# Patient Record
Sex: Male | Born: 1935 | Race: White | Hispanic: No | Marital: Married | Smoking: Former smoker
Health system: Southern US, Community
[De-identification: ages and names within clinical notes are randomized; demographics above are authoritative.]

## PROBLEM LIST (undated history)

## (undated) DIAGNOSIS — C4491 Basal cell carcinoma of skin, unspecified: Secondary | ICD-10-CM

## (undated) DIAGNOSIS — I10 Essential (primary) hypertension: Secondary | ICD-10-CM

## (undated) DIAGNOSIS — I119 Hypertensive heart disease without heart failure: Secondary | ICD-10-CM

## (undated) DIAGNOSIS — R0902 Hypoxemia: Secondary | ICD-10-CM

## (undated) DIAGNOSIS — I714 Abdominal aortic aneurysm, without rupture, unspecified: Secondary | ICD-10-CM

## (undated) DIAGNOSIS — I429 Cardiomyopathy, unspecified: Secondary | ICD-10-CM

## (undated) DIAGNOSIS — Z8719 Personal history of other diseases of the digestive system: Secondary | ICD-10-CM

## (undated) DIAGNOSIS — I6529 Occlusion and stenosis of unspecified carotid artery: Secondary | ICD-10-CM

## (undated) DIAGNOSIS — R202 Paresthesia of skin: Secondary | ICD-10-CM

## (undated) DIAGNOSIS — I4891 Unspecified atrial fibrillation: Secondary | ICD-10-CM

## (undated) DIAGNOSIS — N400 Enlarged prostate without lower urinary tract symptoms: Secondary | ICD-10-CM

## (undated) DIAGNOSIS — E119 Type 2 diabetes mellitus without complications: Secondary | ICD-10-CM

## (undated) DIAGNOSIS — I519 Heart disease, unspecified: Secondary | ICD-10-CM

## (undated) DIAGNOSIS — I82409 Acute embolism and thrombosis of unspecified deep veins of unspecified lower extremity: Secondary | ICD-10-CM

## (undated) DIAGNOSIS — E785 Hyperlipidemia, unspecified: Secondary | ICD-10-CM

## (undated) DIAGNOSIS — I5022 Chronic systolic (congestive) heart failure: Secondary | ICD-10-CM

## (undated) DIAGNOSIS — Z794 Long term (current) use of insulin: Secondary | ICD-10-CM

## (undated) DIAGNOSIS — I2510 Atherosclerotic heart disease of native coronary artery without angina pectoris: Secondary | ICD-10-CM

## (undated) DIAGNOSIS — B351 Tinea unguium: Secondary | ICD-10-CM

## (undated) DIAGNOSIS — I495 Sick sinus syndrome: Secondary | ICD-10-CM

## (undated) DIAGNOSIS — I209 Angina pectoris, unspecified: Secondary | ICD-10-CM

## (undated) DIAGNOSIS — I219 Acute myocardial infarction, unspecified: Secondary | ICD-10-CM

## (undated) DIAGNOSIS — IMO0001 Reserved for inherently not codable concepts without codable children: Secondary | ICD-10-CM

## (undated) DIAGNOSIS — Z950 Presence of cardiac pacemaker: Secondary | ICD-10-CM

## (undated) DIAGNOSIS — L03031 Cellulitis of right toe: Secondary | ICD-10-CM

## (undated) DIAGNOSIS — N184 Chronic kidney disease, stage 4 (severe): Secondary | ICD-10-CM

## (undated) DIAGNOSIS — L600 Ingrowing nail: Secondary | ICD-10-CM

## (undated) DIAGNOSIS — Z8619 Personal history of other infectious and parasitic diseases: Secondary | ICD-10-CM

## (undated) DIAGNOSIS — D649 Anemia, unspecified: Secondary | ICD-10-CM

## (undated) DIAGNOSIS — I340 Nonrheumatic mitral (valve) insufficiency: Secondary | ICD-10-CM

## (undated) DIAGNOSIS — I951 Orthostatic hypotension: Secondary | ICD-10-CM

## (undated) DIAGNOSIS — K219 Gastro-esophageal reflux disease without esophagitis: Secondary | ICD-10-CM

## (undated) DIAGNOSIS — G4730 Sleep apnea, unspecified: Secondary | ICD-10-CM

## (undated) DIAGNOSIS — E669 Obesity, unspecified: Secondary | ICD-10-CM

## (undated) DIAGNOSIS — G629 Polyneuropathy, unspecified: Secondary | ICD-10-CM

## (undated) DIAGNOSIS — R0601 Orthopnea: Secondary | ICD-10-CM

## (undated) DIAGNOSIS — I493 Ventricular premature depolarization: Secondary | ICD-10-CM

## (undated) HISTORY — DX: Essential (primary) hypertension: I10

## (undated) HISTORY — DX: Ventricular premature depolarization: I49.3

## (undated) HISTORY — DX: Occlusion and stenosis of unspecified carotid artery: I65.29

## (undated) HISTORY — DX: Ingrowing nail: L60.0

## (undated) HISTORY — PX: SKIN BIOPSY: SHX1

## (undated) HISTORY — DX: Abdominal aortic aneurysm, without rupture: I71.4

## (undated) HISTORY — DX: Heart disease, unspecified: I51.9

## (undated) HISTORY — DX: Cardiomyopathy, unspecified: I42.9

## (undated) HISTORY — DX: Type 2 diabetes mellitus without complications: E11.9

## (undated) HISTORY — DX: Benign prostatic hyperplasia without lower urinary tract symptoms: N40.0

## (undated) HISTORY — DX: Paresthesia of skin: R20.2

## (undated) HISTORY — DX: Anemia, unspecified: D64.9

## (undated) HISTORY — DX: Tinea unguium: B35.1

## (undated) HISTORY — DX: Sleep apnea, unspecified: G47.30

## (undated) HISTORY — PX: SKIN CANCER EXCISION: SHX779

## (undated) HISTORY — DX: Polyneuropathy, unspecified: G62.9

## (undated) HISTORY — DX: Gastro-esophageal reflux disease without esophagitis: K21.9

## (undated) HISTORY — DX: Atherosclerotic heart disease of native coronary artery without angina pectoris: I25.10

## (undated) HISTORY — PX: VASECTOMY: SHX75

## (undated) HISTORY — DX: Hyperlipidemia, unspecified: E78.5

## (undated) HISTORY — DX: Obesity, unspecified: E66.9

## (undated) HISTORY — DX: Cellulitis of right toe: L03.031

## (undated) HISTORY — DX: Acute embolism and thrombosis of unspecified deep veins of unspecified lower extremity: I82.409

## (undated) HISTORY — PX: CORONARY ARTERY BYPASS GRAFT: SHX141

## (undated) HISTORY — DX: Reserved for inherently not codable concepts without codable children: IMO0001

## (undated) HISTORY — PX: TRANSESOPHAGEAL ECHOCARDIOGRAM: SHX273

## (undated) HISTORY — DX: Sick sinus syndrome: I49.5

## (undated) HISTORY — DX: Long term (current) use of insulin: Z79.4

## (undated) HISTORY — DX: Unspecified atrial fibrillation: I48.91

## (undated) HISTORY — PX: PACEMAKER PLACEMENT: SHX43

## (undated) HISTORY — DX: Abdominal aortic aneurysm, without rupture, unspecified: I71.40

## (undated) HISTORY — DX: Personal history of other infectious and parasitic diseases: Z86.19

## (undated) HISTORY — PX: CATARACT EXTRACTION: SUR2

## (undated) HISTORY — PX: ABDOMINAL AORTIC ANEURYSM REPAIR: SUR1152

## (undated) HISTORY — DX: Chronic systolic (congestive) heart failure: I50.22

## (undated) HISTORY — DX: Nonrheumatic mitral (valve) insufficiency: I34.0

---

## 2003-01-25 ENCOUNTER — Encounter: Payer: Self-pay | Admitting: Pulmonary Disease

## 2003-02-25 ENCOUNTER — Encounter: Payer: Self-pay | Admitting: Pulmonary Disease

## 2004-08-28 HISTORY — PX: INSERT / REPLACE / REMOVE PACEMAKER: SUR710

## 2004-08-28 HISTORY — PX: MITRAL VALVE REPAIR: SHX2039

## 2004-08-28 HISTORY — PX: CARDIAC CATHETERIZATION: SHX172

## 2005-01-30 ENCOUNTER — Inpatient Hospital Stay: Payer: Self-pay | Admitting: Internal Medicine

## 2005-01-31 ENCOUNTER — Inpatient Hospital Stay (HOSPITAL_COMMUNITY)
Admission: AD | Admit: 2005-01-31 | Discharge: 2005-02-12 | Payer: Self-pay | Admitting: Thoracic Surgery (Cardiothoracic Vascular Surgery)

## 2005-02-09 ENCOUNTER — Ambulatory Visit: Payer: Self-pay | Admitting: Internal Medicine

## 2005-02-15 ENCOUNTER — Ambulatory Visit: Payer: Self-pay | Admitting: Endocrinology

## 2005-03-03 ENCOUNTER — Other Ambulatory Visit: Payer: Self-pay

## 2005-03-03 ENCOUNTER — Inpatient Hospital Stay: Payer: Self-pay | Admitting: Internal Medicine

## 2005-03-20 ENCOUNTER — Inpatient Hospital Stay (HOSPITAL_COMMUNITY): Admission: EM | Admit: 2005-03-20 | Discharge: 2005-03-23 | Payer: Self-pay | Admitting: Emergency Medicine

## 2005-03-20 ENCOUNTER — Encounter: Payer: Self-pay | Admitting: Cardiology

## 2005-03-20 ENCOUNTER — Ambulatory Visit: Payer: Self-pay | Admitting: Cardiology

## 2005-03-20 ENCOUNTER — Ambulatory Visit: Payer: Self-pay | Admitting: Family Medicine

## 2005-04-04 ENCOUNTER — Other Ambulatory Visit: Payer: Self-pay

## 2005-04-04 ENCOUNTER — Inpatient Hospital Stay: Payer: Self-pay | Admitting: Internal Medicine

## 2005-04-19 ENCOUNTER — Ambulatory Visit: Payer: Self-pay | Admitting: Family Medicine

## 2005-05-12 ENCOUNTER — Other Ambulatory Visit: Payer: Self-pay

## 2005-05-12 ENCOUNTER — Inpatient Hospital Stay: Payer: Self-pay | Admitting: Infectious Diseases

## 2005-05-28 ENCOUNTER — Ambulatory Visit: Payer: Self-pay | Admitting: Family Medicine

## 2005-07-24 ENCOUNTER — Ambulatory Visit: Payer: Self-pay | Admitting: Vascular Surgery

## 2005-08-01 ENCOUNTER — Ambulatory Visit: Payer: Self-pay | Admitting: Vascular Surgery

## 2005-10-09 ENCOUNTER — Ambulatory Visit: Payer: Self-pay | Admitting: Internal Medicine

## 2005-11-28 ENCOUNTER — Ambulatory Visit: Payer: Self-pay

## 2005-11-28 ENCOUNTER — Ambulatory Visit (HOSPITAL_COMMUNITY): Admission: RE | Admit: 2005-11-28 | Discharge: 2005-11-28 | Payer: Self-pay | Admitting: Nephrology

## 2005-12-15 ENCOUNTER — Ambulatory Visit: Payer: Self-pay | Admitting: Cardiology

## 2005-12-27 ENCOUNTER — Ambulatory Visit: Payer: Self-pay | Admitting: Cardiology

## 2006-01-03 ENCOUNTER — Ambulatory Visit: Payer: Self-pay | Admitting: Internal Medicine

## 2006-01-09 ENCOUNTER — Ambulatory Visit: Payer: Self-pay | Admitting: Cardiology

## 2006-01-23 ENCOUNTER — Encounter: Payer: Self-pay | Admitting: Cardiology

## 2006-01-23 ENCOUNTER — Ambulatory Visit: Payer: Self-pay | Admitting: Internal Medicine

## 2006-01-23 ENCOUNTER — Ambulatory Visit: Payer: Self-pay

## 2006-02-06 ENCOUNTER — Ambulatory Visit: Payer: Self-pay | Admitting: Internal Medicine

## 2006-02-27 ENCOUNTER — Ambulatory Visit: Payer: Self-pay | Admitting: Internal Medicine

## 2006-03-07 ENCOUNTER — Ambulatory Visit: Payer: Self-pay | Admitting: Internal Medicine

## 2006-03-14 ENCOUNTER — Ambulatory Visit: Payer: Self-pay | Admitting: Cardiovascular Disease

## 2006-03-22 ENCOUNTER — Ambulatory Visit: Payer: Self-pay | Admitting: Internal Medicine

## 2006-03-29 ENCOUNTER — Ambulatory Visit: Payer: Self-pay | Admitting: Cardiology

## 2006-04-05 ENCOUNTER — Encounter: Admission: RE | Admit: 2006-04-05 | Discharge: 2006-04-05 | Payer: Self-pay | Admitting: Vascular Surgery

## 2006-04-06 ENCOUNTER — Ambulatory Visit: Payer: Self-pay | Admitting: Cardiology

## 2006-05-02 ENCOUNTER — Ambulatory Visit: Payer: Self-pay | Admitting: *Deleted

## 2006-05-02 ENCOUNTER — Ambulatory Visit: Payer: Self-pay | Admitting: Internal Medicine

## 2006-05-15 ENCOUNTER — Ambulatory Visit: Payer: Self-pay | Admitting: Cardiology

## 2006-05-22 ENCOUNTER — Ambulatory Visit: Payer: Self-pay | Admitting: Cardiovascular Disease

## 2006-05-28 ENCOUNTER — Ambulatory Visit: Payer: Self-pay | Admitting: Internal Medicine

## 2006-05-28 ENCOUNTER — Ambulatory Visit: Payer: Self-pay | Admitting: Cardiology

## 2006-06-11 ENCOUNTER — Ambulatory Visit: Payer: Self-pay | Admitting: Cardiology

## 2006-07-09 ENCOUNTER — Ambulatory Visit: Payer: Self-pay | Admitting: Cardiovascular Disease

## 2006-07-24 ENCOUNTER — Ambulatory Visit: Payer: Self-pay | Admitting: Cardiology

## 2006-08-10 ENCOUNTER — Ambulatory Visit: Payer: Self-pay | Admitting: Cardiology

## 2006-08-23 ENCOUNTER — Ambulatory Visit: Payer: Self-pay | Admitting: Cardiology

## 2006-09-17 ENCOUNTER — Ambulatory Visit: Payer: Self-pay | Admitting: Internal Medicine

## 2006-10-01 ENCOUNTER — Ambulatory Visit: Payer: Self-pay | Admitting: Cardiology

## 2006-10-15 ENCOUNTER — Ambulatory Visit: Payer: Self-pay | Admitting: Internal Medicine

## 2006-10-26 ENCOUNTER — Ambulatory Visit: Payer: Self-pay | Admitting: Internal Medicine

## 2006-10-26 LAB — CONVERTED CEMR LAB
BUN: 50 mg/dL — ABNORMAL HIGH (ref 6–23)
Chloride: 100 meq/L (ref 96–112)
GFR calc Af Amer: 28 mL/min
Glucose, Bld: 170 mg/dL — ABNORMAL HIGH (ref 70–99)

## 2006-10-29 ENCOUNTER — Ambulatory Visit: Payer: Self-pay | Admitting: Cardiovascular Disease

## 2006-10-29 ENCOUNTER — Ambulatory Visit: Payer: Self-pay | Admitting: Cardiology

## 2006-10-29 LAB — CONVERTED CEMR LAB
CO2: 34 meq/L — ABNORMAL HIGH (ref 19–32)
Calcium: 9 mg/dL (ref 8.4–10.5)
Chloride: 102 meq/L (ref 96–112)
Creatinine, Ser: 2.9 mg/dL — ABNORMAL HIGH (ref 0.4–1.5)
Glucose, Bld: 123 mg/dL — ABNORMAL HIGH (ref 70–99)
Potassium: 4.8 meq/L (ref 3.5–5.1)
Sodium: 142 meq/L (ref 135–145)

## 2006-11-02 ENCOUNTER — Encounter: Admission: RE | Admit: 2006-11-02 | Discharge: 2006-11-02 | Payer: Self-pay | Admitting: Vascular Surgery

## 2006-11-02 ENCOUNTER — Ambulatory Visit: Payer: Self-pay | Admitting: Vascular Surgery

## 2006-11-19 ENCOUNTER — Ambulatory Visit: Payer: Self-pay | Admitting: Cardiology

## 2006-11-28 ENCOUNTER — Ambulatory Visit: Payer: Self-pay | Admitting: Cardiology

## 2006-11-28 ENCOUNTER — Encounter: Payer: Self-pay | Admitting: Cardiology

## 2006-11-28 ENCOUNTER — Ambulatory Visit: Payer: Self-pay

## 2006-12-14 ENCOUNTER — Ambulatory Visit: Payer: Self-pay | Admitting: Vascular Surgery

## 2006-12-26 ENCOUNTER — Ambulatory Visit: Payer: Self-pay | Admitting: Vascular Surgery

## 2006-12-27 ENCOUNTER — Encounter (INDEPENDENT_AMBULATORY_CARE_PROVIDER_SITE_OTHER): Payer: Self-pay | Admitting: Specialist

## 2006-12-27 ENCOUNTER — Inpatient Hospital Stay (HOSPITAL_COMMUNITY): Admission: RE | Admit: 2006-12-27 | Discharge: 2006-12-31 | Payer: Self-pay | Admitting: Vascular Surgery

## 2006-12-27 ENCOUNTER — Ambulatory Visit: Payer: Self-pay | Admitting: Vascular Surgery

## 2007-01-08 ENCOUNTER — Ambulatory Visit: Payer: Self-pay | Admitting: Cardiology

## 2007-01-10 ENCOUNTER — Ambulatory Visit: Payer: Self-pay | Admitting: *Deleted

## 2007-01-25 ENCOUNTER — Ambulatory Visit: Payer: Self-pay | Admitting: Vascular Surgery

## 2007-02-11 ENCOUNTER — Ambulatory Visit: Payer: Self-pay | Admitting: Cardiology

## 2007-03-03 ENCOUNTER — Other Ambulatory Visit: Payer: Self-pay

## 2007-03-04 ENCOUNTER — Ambulatory Visit: Payer: Self-pay | Admitting: Cardiology

## 2007-03-04 ENCOUNTER — Inpatient Hospital Stay: Payer: Self-pay | Admitting: Internal Medicine

## 2007-03-04 ENCOUNTER — Other Ambulatory Visit: Payer: Self-pay

## 2007-03-21 ENCOUNTER — Encounter: Payer: Self-pay | Admitting: Internal Medicine

## 2007-04-08 ENCOUNTER — Ambulatory Visit: Payer: Self-pay | Admitting: Cardiology

## 2007-04-24 ENCOUNTER — Encounter (HOSPITAL_COMMUNITY): Admission: RE | Admit: 2007-04-24 | Discharge: 2007-07-05 | Payer: Self-pay | Admitting: Nephrology

## 2007-05-15 ENCOUNTER — Ambulatory Visit: Payer: Self-pay | Admitting: Internal Medicine

## 2007-05-24 ENCOUNTER — Ambulatory Visit: Payer: Self-pay | Admitting: Cardiology

## 2007-07-22 ENCOUNTER — Ambulatory Visit: Payer: Self-pay | Admitting: Cardiology

## 2007-07-30 ENCOUNTER — Ambulatory Visit: Payer: Self-pay | Admitting: Internal Medicine

## 2007-07-31 ENCOUNTER — Ambulatory Visit: Payer: Self-pay

## 2007-07-31 ENCOUNTER — Encounter (HOSPITAL_COMMUNITY): Admission: RE | Admit: 2007-07-31 | Discharge: 2007-10-29 | Payer: Self-pay | Admitting: Nephrology

## 2007-08-14 ENCOUNTER — Ambulatory Visit: Payer: Self-pay | Admitting: Vascular Surgery

## 2007-09-16 ENCOUNTER — Ambulatory Visit: Payer: Self-pay | Admitting: Cardiology

## 2007-10-30 ENCOUNTER — Ambulatory Visit: Payer: Self-pay | Admitting: Internal Medicine

## 2007-11-06 ENCOUNTER — Encounter (HOSPITAL_COMMUNITY): Admission: RE | Admit: 2007-11-06 | Discharge: 2008-02-04 | Payer: Self-pay | Admitting: Nephrology

## 2007-11-14 ENCOUNTER — Ambulatory Visit: Payer: Self-pay | Admitting: Cardiology

## 2008-01-07 ENCOUNTER — Ambulatory Visit: Payer: Self-pay | Admitting: Cardiology

## 2008-02-03 ENCOUNTER — Ambulatory Visit: Payer: Self-pay | Admitting: Internal Medicine

## 2008-02-25 ENCOUNTER — Encounter (HOSPITAL_COMMUNITY): Admission: RE | Admit: 2008-02-25 | Discharge: 2008-05-20 | Payer: Self-pay | Admitting: Nephrology

## 2008-02-26 ENCOUNTER — Ambulatory Visit: Payer: Self-pay | Admitting: Vascular Surgery

## 2008-03-02 ENCOUNTER — Ambulatory Visit: Payer: Self-pay | Admitting: Pulmonary Disease

## 2008-03-02 DIAGNOSIS — E785 Hyperlipidemia, unspecified: Secondary | ICD-10-CM

## 2008-03-02 DIAGNOSIS — J309 Allergic rhinitis, unspecified: Secondary | ICD-10-CM | POA: Insufficient documentation

## 2008-03-02 DIAGNOSIS — I499 Cardiac arrhythmia, unspecified: Secondary | ICD-10-CM | POA: Insufficient documentation

## 2008-03-02 DIAGNOSIS — G4733 Obstructive sleep apnea (adult) (pediatric): Secondary | ICD-10-CM

## 2008-03-02 DIAGNOSIS — I219 Acute myocardial infarction, unspecified: Secondary | ICD-10-CM | POA: Insufficient documentation

## 2008-03-02 DIAGNOSIS — I10 Essential (primary) hypertension: Secondary | ICD-10-CM | POA: Insufficient documentation

## 2008-03-16 ENCOUNTER — Ambulatory Visit: Payer: Self-pay

## 2008-03-30 ENCOUNTER — Ambulatory Visit: Payer: Self-pay | Admitting: Pulmonary Disease

## 2008-03-30 DIAGNOSIS — G4700 Insomnia, unspecified: Secondary | ICD-10-CM

## 2008-05-19 ENCOUNTER — Ambulatory Visit: Payer: Self-pay | Admitting: Internal Medicine

## 2008-05-19 ENCOUNTER — Ambulatory Visit: Payer: Self-pay | Admitting: Cardiology

## 2008-06-10 ENCOUNTER — Telehealth (INDEPENDENT_AMBULATORY_CARE_PROVIDER_SITE_OTHER): Payer: Self-pay | Admitting: *Deleted

## 2008-06-22 ENCOUNTER — Ambulatory Visit: Payer: Self-pay | Admitting: Pulmonary Disease

## 2008-07-20 ENCOUNTER — Ambulatory Visit: Payer: Self-pay | Admitting: Internal Medicine

## 2008-08-05 ENCOUNTER — Encounter (HOSPITAL_COMMUNITY): Admission: RE | Admit: 2008-08-05 | Discharge: 2008-08-27 | Payer: Self-pay | Admitting: Nephrology

## 2008-08-24 ENCOUNTER — Ambulatory Visit: Payer: Self-pay | Admitting: Cardiology

## 2008-08-26 ENCOUNTER — Ambulatory Visit: Payer: Self-pay | Admitting: Vascular Surgery

## 2008-10-26 ENCOUNTER — Ambulatory Visit: Payer: Self-pay | Admitting: Internal Medicine

## 2008-11-04 ENCOUNTER — Encounter: Payer: Self-pay | Admitting: Internal Medicine

## 2008-11-10 ENCOUNTER — Encounter: Payer: Self-pay | Admitting: Internal Medicine

## 2008-11-10 ENCOUNTER — Ambulatory Visit: Payer: Self-pay

## 2008-11-22 DIAGNOSIS — I080 Rheumatic disorders of both mitral and aortic valves: Secondary | ICD-10-CM

## 2008-11-22 DIAGNOSIS — Z951 Presence of aortocoronary bypass graft: Secondary | ICD-10-CM

## 2008-11-22 DIAGNOSIS — I2510 Atherosclerotic heart disease of native coronary artery without angina pectoris: Secondary | ICD-10-CM

## 2008-11-23 ENCOUNTER — Encounter: Payer: Self-pay | Admitting: Cardiology

## 2008-11-23 ENCOUNTER — Ambulatory Visit: Payer: Self-pay | Admitting: Cardiology

## 2008-11-23 DIAGNOSIS — I255 Ischemic cardiomyopathy: Secondary | ICD-10-CM

## 2008-12-03 ENCOUNTER — Encounter: Payer: Self-pay | Admitting: Cardiology

## 2008-12-03 ENCOUNTER — Encounter: Payer: Self-pay | Admitting: Pulmonary Disease

## 2008-12-03 ENCOUNTER — Ambulatory Visit: Payer: Self-pay | Admitting: Internal Medicine

## 2008-12-09 ENCOUNTER — Ambulatory Visit: Payer: Self-pay | Admitting: Vascular Surgery

## 2008-12-15 ENCOUNTER — Inpatient Hospital Stay (HOSPITAL_COMMUNITY): Admission: RE | Admit: 2008-12-15 | Discharge: 2008-12-16 | Payer: Self-pay | Admitting: Vascular Surgery

## 2008-12-15 ENCOUNTER — Encounter: Payer: Self-pay | Admitting: Cardiology

## 2008-12-15 ENCOUNTER — Encounter: Payer: Self-pay | Admitting: Vascular Surgery

## 2008-12-15 ENCOUNTER — Ambulatory Visit: Payer: Self-pay | Admitting: Vascular Surgery

## 2008-12-17 ENCOUNTER — Encounter (INDEPENDENT_AMBULATORY_CARE_PROVIDER_SITE_OTHER): Payer: Self-pay | Admitting: *Deleted

## 2008-12-30 ENCOUNTER — Ambulatory Visit: Payer: Self-pay | Admitting: Vascular Surgery

## 2009-02-01 ENCOUNTER — Encounter: Payer: Self-pay | Admitting: Internal Medicine

## 2009-02-01 ENCOUNTER — Ambulatory Visit: Payer: Self-pay | Admitting: Internal Medicine

## 2009-02-03 ENCOUNTER — Ambulatory Visit: Payer: Self-pay | Admitting: Vascular Surgery

## 2009-02-22 ENCOUNTER — Telehealth: Payer: Self-pay | Admitting: Internal Medicine

## 2009-02-26 ENCOUNTER — Encounter: Payer: Self-pay | Admitting: Internal Medicine

## 2009-03-12 ENCOUNTER — Encounter: Payer: Self-pay | Admitting: Internal Medicine

## 2009-03-12 ENCOUNTER — Ambulatory Visit: Payer: Self-pay | Admitting: Internal Medicine

## 2009-03-12 DIAGNOSIS — I493 Ventricular premature depolarization: Secondary | ICD-10-CM

## 2009-03-12 DIAGNOSIS — I4949 Other premature depolarization: Secondary | ICD-10-CM | POA: Insufficient documentation

## 2009-03-16 ENCOUNTER — Telehealth: Payer: Self-pay | Admitting: Cardiology

## 2009-03-16 ENCOUNTER — Telehealth (INDEPENDENT_AMBULATORY_CARE_PROVIDER_SITE_OTHER): Payer: Self-pay | Admitting: *Deleted

## 2009-03-19 ENCOUNTER — Inpatient Hospital Stay (HOSPITAL_COMMUNITY): Admission: RE | Admit: 2009-03-19 | Discharge: 2009-03-20 | Payer: Self-pay | Admitting: Internal Medicine

## 2009-03-19 ENCOUNTER — Ambulatory Visit: Payer: Self-pay | Admitting: Internal Medicine

## 2009-03-23 ENCOUNTER — Encounter (INDEPENDENT_AMBULATORY_CARE_PROVIDER_SITE_OTHER): Payer: Self-pay | Admitting: *Deleted

## 2009-04-21 ENCOUNTER — Ambulatory Visit: Payer: Self-pay | Admitting: Internal Medicine

## 2009-04-21 DIAGNOSIS — I714 Abdominal aortic aneurysm, without rupture, unspecified: Secondary | ICD-10-CM | POA: Insufficient documentation

## 2009-04-29 ENCOUNTER — Ambulatory Visit: Payer: Self-pay

## 2009-04-29 ENCOUNTER — Encounter: Payer: Self-pay | Admitting: Internal Medicine

## 2009-04-29 ENCOUNTER — Ambulatory Visit: Payer: Self-pay | Admitting: Cardiology

## 2009-05-31 ENCOUNTER — Encounter: Payer: Self-pay | Admitting: Cardiology

## 2009-06-23 ENCOUNTER — Ambulatory Visit: Payer: Self-pay | Admitting: Vascular Surgery

## 2009-08-09 ENCOUNTER — Ambulatory Visit: Payer: Self-pay | Admitting: Internal Medicine

## 2009-08-09 ENCOUNTER — Other Ambulatory Visit: Payer: Self-pay | Admitting: Family Medicine

## 2009-08-09 DIAGNOSIS — N184 Chronic kidney disease, stage 4 (severe): Secondary | ICD-10-CM

## 2009-08-09 LAB — CONVERTED CEMR LAB
Albumin: 3 g/dL
Calcium: 9 mg/dL
Chloride: 101 meq/L
Hemoglobin: 12.1 g/dL
MCV: 89 fL
Potassium: 4.2 meq/L
RBC: 4.07 M/uL
RDW: 15.7 %

## 2009-08-10 ENCOUNTER — Encounter: Payer: Self-pay | Admitting: Internal Medicine

## 2009-08-16 ENCOUNTER — Encounter: Payer: Self-pay | Admitting: Internal Medicine

## 2009-08-17 ENCOUNTER — Ambulatory Visit: Payer: Self-pay | Admitting: Internal Medicine

## 2009-08-31 ENCOUNTER — Encounter: Payer: Self-pay | Admitting: Cardiology

## 2009-09-10 ENCOUNTER — Encounter: Payer: Self-pay | Admitting: Cardiology

## 2009-09-27 ENCOUNTER — Ambulatory Visit: Payer: Self-pay | Admitting: Cardiology

## 2009-10-25 ENCOUNTER — Ambulatory Visit: Payer: Self-pay | Admitting: Internal Medicine

## 2009-11-26 ENCOUNTER — Ambulatory Visit: Payer: Self-pay | Admitting: Internal Medicine

## 2009-11-30 LAB — CONVERTED CEMR LAB
AST: 24 units/L (ref 0–37)
Alkaline Phosphatase: 55 units/L (ref 39–117)
Bilirubin, Direct: 0.1 mg/dL (ref 0.0–0.3)
GFR calc non Af Amer: 22.02 mL/min (ref >60)
Glucose, Bld: 91 mg/dL (ref 70–99)
Potassium: 4.3 meq/L (ref 3.5–5.1)
Sodium: 143 meq/L (ref 135–145)
Total Bilirubin: 0.7 mg/dL (ref 0.3–1.2)

## 2009-12-08 ENCOUNTER — Ambulatory Visit: Payer: Self-pay | Admitting: Vascular Surgery

## 2009-12-15 ENCOUNTER — Encounter: Payer: Self-pay | Admitting: Internal Medicine

## 2010-02-01 ENCOUNTER — Ambulatory Visit: Payer: Self-pay | Admitting: Cardiology

## 2010-03-04 ENCOUNTER — Encounter: Payer: Self-pay | Admitting: Cardiology

## 2010-03-17 ENCOUNTER — Encounter: Payer: Self-pay | Admitting: Cardiology

## 2010-06-02 ENCOUNTER — Encounter: Payer: Self-pay | Admitting: Internal Medicine

## 2010-06-02 ENCOUNTER — Ambulatory Visit: Payer: Self-pay | Admitting: Internal Medicine

## 2010-06-02 DIAGNOSIS — I495 Sick sinus syndrome: Secondary | ICD-10-CM | POA: Insufficient documentation

## 2010-06-06 LAB — CONVERTED CEMR LAB
ALT: 21 units/L (ref 0–53)
AST: 23 units/L (ref 0–37)
BUN: 36 mg/dL — ABNORMAL HIGH (ref 6–23)
CO2: 32 meq/L (ref 19–32)
Chloride: 99 meq/L (ref 96–112)
Creatinine, Ser: 2.8 mg/dL — ABNORMAL HIGH (ref 0.4–1.5)
Glucose, Bld: 144 mg/dL — ABNORMAL HIGH (ref 70–99)
Potassium: 4.4 meq/L (ref 3.5–5.1)
Total Protein: 6.9 g/dL (ref 6.0–8.3)

## 2010-06-17 ENCOUNTER — Encounter: Payer: Self-pay | Admitting: Internal Medicine

## 2010-07-14 ENCOUNTER — Ambulatory Visit: Payer: Self-pay | Admitting: Vascular Surgery

## 2010-08-05 ENCOUNTER — Encounter: Payer: Self-pay | Admitting: Cardiology

## 2010-08-05 ENCOUNTER — Ambulatory Visit: Payer: Self-pay | Admitting: Cardiology

## 2010-09-06 ENCOUNTER — Encounter (INDEPENDENT_AMBULATORY_CARE_PROVIDER_SITE_OTHER): Payer: Self-pay | Admitting: *Deleted

## 2010-09-12 ENCOUNTER — Encounter: Payer: Self-pay | Admitting: Internal Medicine

## 2010-09-12 ENCOUNTER — Ambulatory Visit
Admission: RE | Admit: 2010-09-12 | Discharge: 2010-09-12 | Payer: Self-pay | Source: Home / Self Care | Attending: Internal Medicine | Admitting: Internal Medicine

## 2010-09-25 LAB — CONVERTED CEMR LAB
Albumin: 3.2 g/dL — ABNORMAL LOW (ref 3.5–5.2)
Alkaline Phosphatase: 43 units/L (ref 39–117)
BUN: 41 mg/dL — ABNORMAL HIGH (ref 6–23)
Basophils Absolute: 0 10*3/uL (ref 0.0–0.1)
Basophils Relative: 0 % (ref 0.0–3.0)
CO2: 32 meq/L (ref 19–32)
Calcium: 8.9 mg/dL
Calcium: 9.1 mg/dL (ref 8.4–10.5)
Calcium: 9.1 mg/dL (ref 8.4–10.5)
Cholesterol CEMR: 135 mg/dL
Cholesterol: 135 mg/dL
Creatinine, Ser: 2.4 mg/dL — ABNORMAL HIGH (ref 0.4–1.5)
Creatinine, Ser: 2.53 mg/dL
Eosinophils Absolute: 0.2 10*3/uL (ref 0.0–0.7)
Glomerular Filtration Rate, Af Am: 31 mL/min/{1.73_m2}
Glucose, Bld: 112 mg/dL
Glucose, Bld: 132 mg/dL — ABNORMAL HIGH (ref 70–99)
Glucose, Bld: 147 mg/dL — ABNORMAL HIGH (ref 70–99)
HCT: 38.2 %
HCT: 38.3 % — ABNORMAL LOW (ref 39.0–52.0)
HDL: 34 mg/dL
Hemoglobin: 12.3 g/dL — ABNORMAL LOW (ref 13.0–17.0)
Hemoglobin: 12.3 g/dL — ABNORMAL LOW (ref 13.0–17.0)
Hgb A1c MFr Bld: 6.9 %
LDL Cholesterol: 82 mg/dL
Lymphocytes Relative: 11.8 % — ABNORMAL LOW (ref 12.0–46.0)
MCHC: 32.1 g/dL (ref 30.0–36.0)
MCHC: 32.6 g/dL (ref 30.0–36.0)
MCHC: 32.8 g/dL
MCV: 88.2 fL (ref 78.0–100.0)
Monocytes Absolute: 0.7 10*3/uL (ref 0.1–1.0)
Neutro Abs: 4.1 10*3/uL (ref 1.4–7.7)
Neutro Abs: 4.8 10*3/uL (ref 1.4–7.7)
Neutrophils Relative %: 70.3 % (ref 43.0–77.0)
Neutrophils Relative %: 74 % (ref 43.0–77.0)
Platelets: 186 10*3/uL (ref 150.0–400.0)
Platelets: 192 10*3/uL
Potassium: 4.1 meq/L
Potassium: 5 meq/L (ref 3.5–5.1)
Prothrombin Time: 24.3 s — ABNORMAL HIGH (ref 10.9–13.3)
RBC: 4.32 M/uL
RBC: 4.34 M/uL (ref 4.22–5.81)
RDW: 15.7 % — ABNORMAL HIGH (ref 11.5–14.6)
RDW: 16 % — ABNORMAL HIGH (ref 11.5–14.6)
Sodium: 141 meq/L
TSH: 1.28 microintl units/mL (ref 0.35–5.50)
Total Bilirubin: 0.4 mg/dL
Triglyceride fasting, serum: 94 mg/dL
WBC: 6 10*3/uL
aPTT: 38.5 s — ABNORMAL HIGH (ref 21.7–28.8)

## 2010-09-28 NOTE — Cardiovascular Report (Signed)
Summary: Office Visit   Office Visit   Imported By: Roderic Ovens 12/03/2009 14:14:14  _____________________________________________________________________  External Attachment:    Type:   Image     Comment:   External Document

## 2010-09-28 NOTE — Cardiovascular Report (Signed)
Summary: Office Visit    Office Visit    Imported By: Roderic Ovens 06/03/2010 16:45:03  _____________________________________________________________________  External Attachment:    Type:   Image     Comment:   External Document

## 2010-09-28 NOTE — Assessment & Plan Note (Signed)
Summary: f44m/dm   Visit Type:  Follow-up Referring Provider:  Dr Antoine Poche Primary Provider:  [name], MD   History of Present Illness: The patient presents today for routine electrophysiology followup. He reports doing very well since last being seen in our clinic.  His symptomatic PVCs have resolved.  His energy and SOB have improved.  His edema is also better.  He reports that he is following with Dr Caryn Section and is renal function has been stable. The patient denies symptoms of palpitations, chest pain,  orthopnea, PND, dizziness, presyncope, syncope, or neurologic sequela. The patient is tolerating medications without difficulties and is otherwise without complaint today.   Current Medications (verified): 1)  Crestor 10 Mg  Tabs (Rosuvastatin Calcium) .... Take 1 Tablet By Mouth Once A Day 2)  Klor-Con M10 10 Meq  Tbcr (Potassium Chloride Crys Cr) .... Take2  Tablet By Mouth Two Times A Day 3)  Furosemide 80 Mg  Tabs (Furosemide) .... Take 1 Tablet By Mouth 2 Times A Day 4)  Flonase 50 Mcg/act  Susp (Fluticasone Propionate) .... As Needed 5)  Aspirin Low Dose 81 Mg  Tabs (Aspirin) .... Take 1 Tablet By Mouth Once A Day 6)  Calcitriol 0.5 Mcg  Caps (Calcitriol) .... Take 1 Tablet By Mouth Once A Day 7)  Carvedilol 6.25 Mg  Tabs (Carvedilol) .... Take 1 Tablet By Mouth Two Times A Day 8)  Hydralazine Hcl 25 Mg Tabs (Hydralazine Hcl) .... 1/2 By Mouth Two Times A Day 9)  Docusate Sodium 100 Mg  Caps (Docusate Sodium) .... Take 1 Tablet By Mouth Two Times A Day 10)  Warfarin Sodium 2 Mg Tabs (Warfarin Sodium) .... Use As Directed By Anticoagualtion Clinic 11)  Flomax 0.4 Mg Xr24h-Cap (Tamsulosin Hcl) .... One By Mouth Daily 12)  Novolog 100 Unit/ml Soln (Insulin Aspart) .... As Directed (17 Units Twice Daily) 13)  Isosorbide Dinitrate 30 Mg Tabs (Isosorbide Dinitrate) .... One By Mouth Two Times A Day 14)  Omeprazole 20 Mg Tbec (Omeprazole) .... 3 Times A Week 15)  Amiodarone Hcl 200 Mg  Tabs (Amiodarone Hcl) .... One By Mouth Daily  Allergies: 1)  ! * Shellfish  Past History:  Past Medical History: Reviewed history from 09/27/2009 and no changes required. Cardiomyopathy with an ejection fraction of approximately 40% Coronary artery disease s/p CABG 2006 Mitral regurgitation with  mitral valve repair Sick sinus syndrome status post pacemaker placement 6/06 Frequent premature ventricular contractions s/p ablation 7/10 Sleep apnea not tolerant of CPAP Chronic renal insufficiency Diabetes mellitus,  Hypertension Benign prostatic hypertrophy Anemia Deep venous thrombosis on chronic Coumadin therapy.   Past Surgical History: Reviewed history from 04/21/2009 and no changes required. Status post CABG (LIMA to LAD, SVG to Circ, SVG to PL) 2006 Mitral valve repair with a #28 Edwards logic ring, Status post permanent pacemaker AAA s/p repair May 2008 Ablation for PVCs 7/10  Social History: Reviewed history from 03/12/2009 and no changes required. The patient lives with his girlfriend.  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  He quit smoking in 1988 after a 17-pack- year history.  He occasionally drinks alcohol.  None since his surgery in June.  No illicit drug use.  Vital Signs:  Patient profile:   75 year old male Height:      72 inches Weight:      268 pounds BMI:     36.48 Pulse rate:   72 / minute BP sitting:   104 / 50  (left  arm)  Vitals Entered By: Laurance Flatten CMA (June 02, 2010 9:09 AM)  Physical Exam  General:  chronically ill, obese Head:  normocephalic and atraumatic Eyes:  PERRLA/EOM intact; conjunctiva and lids normal. Mouth:  Teeth, gums and palate normal. Oral mucosa normal. Neck:  Neck supple, no JVD. No masses, thyromegaly or abnormal cervical nodes. Chest Wall:  well-healed sternotomy scar and pacemaker pocket Lungs:  Clear bilaterally to auscultation and percussion. Heart:  RRR, .  S1, S2.  NO S3.  NO significant  murmurs. Abdomen:  Bowel sounds positive; abdomen soft and non-tender without masses, organomegaly, or hernias noted. No hepatosplenomegaly, obese Msk:  Back normal, normal gait. Muscle strength and tone normal. Pulses:  deminished pedal pulses Extremities:  bilateral mild lower extremity edema, chronic venous stasis changes Neurologic:  Alert and oriented x 3. Skin:  Intact without lesions or rashes.   PPM Specifications Following MD:  Sherryl Manges, MD     Referring MD:  [facility] PPM Vendor:  [employer]     PPM Model Number:  [id number]     PPM Serial Number:  [id number] PPM DOI:  02/09/2005      Lead 1    Location: RA     DOI: 02/09/2005     Model #: 2355     Serial #: [id number]     Status: active Lead 2    Location: RV     DOI: 02/09/2005     Model #: 0623     Serial #: [id number]     Status: active   Indications:  SINUS NODE DYSFUNCTION    PPM Follow Up Battery Voltage:  2.99 V     Pacer Dependent:  Yes       PPM Device Measurements Atrium  Amplitude: 2.4 mV, Impedance: 528 ohms, Threshold: 0.5 V at 0.4 msec Right Ventricle  Amplitude: 18.2 mV, Impedance: 432 ohms, Threshold: 0.5 V at 0.4 msec  Episodes MS Episodes:  0     Coumadin:  Yes Ventricular High Rate:  0     Atrial Pacing:  100%     Ventricular Pacing:  0.3%  Parameters Mode:  MVP     Lower Rate Limit:  60     Upper Rate Limit:  140 Paced AV Delay:  180     Sensed AV Delay:  300 Next Remote Date:  09/01/2010     Next Cardiology Appt Due:  05/29/2011 Tech Comments:  NORMAL DEVICE FUNCTIION.  SINCE LAST CHECK IN APRIL PVC RUNS AND SINGLES DECREASED.  LAST CHECK PVC RUNS 8.5 PER HOUR AND ON TODAYS CHECK <0.1 PER HOUR.  PVC SINGLES LAST CHECK >999.9/HR AND ON TODAYS CHECK 16.9/HR.  NO CHANGES MADE. CARELINK CHECK 09-01-10 AND ROV IN 12 MTHS W/JA. Vella Kohler MD Comments:  He has no PVCs on telemetry today.  Review of his PVC counter reveals dramatic (near resolution) of PVCs on amiodarone.  No changes today.  Impression  & Recommendations:  Problem # 1:  PAROXYSMAL VENTRICULAR TACHYCARDIA (ICD-427.1)  symptomatic PVCs and NSVT are resolved with amiodarone therapy we will check LFTs and TFTs today no changes  we will consider decreasing amiodarone to 100mg  daily if PVCs remain supressed at 1 year follow-up. I would avoid repeat ablation given his multiple significant comorbidities.  His updated medication list for this problem includes:    Aspirin Low Dose 81 Mg Tabs (Aspirin) .[name]... Take 1 tablet by mouth once a day    Carvedilol 6.25 Mg Tabs (Carvedilol) .[name]... Take 1  tablet by mouth two times a day    Warfarin Sodium 2 Mg Tabs (Warfarin sodium) ..... Use as directed by anticoagualtion clinic    Isosorbide Dinitrate 30 Mg Tabs (Isosorbide dinitrate) ..... One by mouth two times a day    Amiodarone Hcl 200 Mg Tabs (Amiodarone hcl) ..... One by mouth daily  Problem # 2:  CHRONIC KIDNEY DISEASE UNSPECIFIED (ICD-585.9) stable and followed by Dr Caryn Section  Problem # 3:  CARDIOMYOPATHY (ICD-425.4) the patient has an ischemic CM (EF 40%) with chronic systolic dysfunction and NYHA Class II/III CHF.  Symptoms have improved with supression of PVCs. continue medical therpay as directed by Dr Antoine Poche  Problem # 4:  PACEMAKER, PERMANENT (ICD-V45.01) normal pacemaker function no changes today  Other Orders: TLB-BMP (Basic Metabolic Panel-BMET) (80048-METABOL) TLB-Hepatic/Liver Function Pnl (80076-HEPATIC)  Patient Instructions: 1)  Your physician wants you to follow-up in:  12months with DrAllred You will receive a reminder letter in the mail two months in advance. If you don't receive a letter, please call our office to schedule the follow-up appointment. 2)  [facility] on 09/01/2010

## 2010-09-28 NOTE — Miscellaneous (Signed)
  Clinical Lists Changes  Observations: Added new observation of US CAROTID: 1. 60-79% stenosis noted in the left internal carotid artery.   2. 40-59% stenosis noted in the right internal carotid artery.        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 60-79% stenosis noted in the left internal carotid artery.   2. 40-59% stenosis noted in the right internal carotid artery.

## 2010-09-28 NOTE — Consult Note (Signed)
Summary: [facility]   Imported By: Marylou Mccoy 11/10/2009 12:25:09  _____________________________________________________________________  External Attachment:    Type:   Image     Comment:   External Document

## 2010-09-28 NOTE — Assessment & Plan Note (Signed)
Summary: 6 month rov 414.01  pfh,rn   Visit Type:  Follow-up Primary Provider:  [name], MD  CC:  Cardiomyopathy and CAD.  History of Present Illness: The patient returns for followup of his coronary disease and cardiomyopathy. Since I last saw him he has had no new cardiovascular complaints. He still does not dissipate very well in risk reduction. These actually gained 17 pounds since I saw him last. He admits to eating too much. He is very sedentary. He does not get shortness of breath, PND or orthopnea. He does not get chest pressure, neck or arm discomfort. He does not have palpitations, presyncope or syncope.   Current Medications (verified): 1)  Crestor 10 Mg  Tabs (Rosuvastatin Calcium) .... Take 1 Tablet By Mouth Once A Day 2)  Klor-Con M10 10 Meq  Tbcr (Potassium Chloride Crys Cr) .... Take2  Tablet By Mouth Two Times A Day 3)  Furosemide 80 Mg  Tabs (Furosemide) .... Take 1 Tablet By Mouth 2 Times A Day 4)  Flonase 50 Mcg/act  Susp (Fluticasone Propionate) .... As Needed 5)  Aspirin Low Dose 81 Mg  Tabs (Aspirin) .... Take 1 Tablet By Mouth Once A Day 6)  Calcitriol 0.5 Mcg  Caps (Calcitriol) .... Take 1 Tablet By Mouth Once A Day 7)  Carvedilol 6.25 Mg  Tabs (Carvedilol) .... Take 1 Tablet By Mouth Two Times A Day 8)  Hydralazine Hcl 25 Mg Tabs (Hydralazine Hcl) .... 1/2 By Mouth Two Times A Day 9)  Docusate Sodium 100 Mg  Caps (Docusate Sodium) .... Take 1 Tablet By Mouth Two Times A Day 10)  Warfarin Sodium 2 Mg Tabs (Warfarin Sodium) .... Use As Directed By Anticoagualtion Clinic 11)  Flomax 0.4 Mg Xr24h-Cap (Tamsulosin Hcl) .... One By Mouth Daily 12)  Novolog 100 Unit/ml Soln (Insulin Aspart) .... As Directed (17 Units Twice Daily) 13)  Isosorbide Dinitrate 30 Mg Tabs (Isosorbide Dinitrate) .... One By Mouth Two Times A Day 14)  Omeprazole 20 Mg Tbec (Omeprazole) .... 3 Times A Week 15)  Amiodarone Hcl 200 Mg Tabs (Amiodarone Hcl) .... One By Mouth Daily  Allergies  (verified): 1)  ! * Shellfish  Past History:  Past Medical History: Reviewed history from 09/27/2009 and no changes required. Cardiomyopathy with an ejection fraction of approximately 40% Coronary artery disease s/p CABG 2006 Mitral regurgitation with  mitral valve repair Sick sinus syndrome status post pacemaker placement 6/06 Frequent premature ventricular contractions s/p ablation 7/10 Sleep apnea not tolerant of CPAP Chronic renal insufficiency Diabetes mellitus,  Hypertension Benign prostatic hypertrophy Anemia Deep venous thrombosis on chronic Coumadin therapy.   Past Surgical History: Reviewed history from 04/21/2009 and no changes required. Status post CABG (LIMA to LAD, SVG to Circ, SVG to PL) 2006 Mitral valve repair with a #28 Edwards logic ring, Status post permanent pacemaker AAA s/p repair May 2008 Ablation for PVCs 7/10  Review of Systems       As stated in the HPI and negative for all other systems.   Vital Signs:  Patient profile:   75 year old male Height:      72 inches Weight:      279 pounds BMI:     37.98 Pulse rate:   68 / minute Resp:     18 per minute BP sitting:   138 / 77  (right arm)  Vitals Entered By: Marrion Coy, CNA (August 05, 2010 9:45 AM)  Physical Exam  General:  Well developed, well nourished,  in no acute distress.obese.   Head:  normocephalic and atraumatic Eyes:  PERRLA/EOM intact; conjunctiva and lids normal. Neck:  Neck supple, no JVD. No masses, thyromegaly or abnormal cervical nodes. Chest Wall:  well-healed sternotomy scar and pacemaker pocket Lungs:  Clear bilaterally to auscultation and percussion. Abdomen:  Bowel sounds positive; abdomen soft and non-tender without masses, organomegaly, or hernias noted. No hepatosplenomegaly, obese Msk:  Back normal, normal gait. Muscle strength and tone normal. Extremities:  bilateral mild lower extremity edema, chronic venous stasis changes Neurologic:  Alert and oriented x  3. Skin:  Intact without lesions or rashes. Cervical Nodes:  no significant adenopathy Inguinal Nodes:  no significant adenopathy Psych:  Normal affect.   Detailed Cardiovascular Exam  Neck    Carotids: Carotids full and equal bilaterally without bruits.      Neck Veins: Normal, no JVD.    Heart    Inspection: no deformities or lifts noted.      Palpation: normal PMI with no thrills palpable.      Auscultation: regular rate and rhythm, S1, S2 without murmurs, rubs, gallops, or clicks.    Vascular    Abdominal Aorta: no palpable masses, pulsations, or audible bruits.      Femoral Pulses: normal femoral pulses bilaterally.      Pedal Pulses: deminished pedal pulses    Radial Pulses: normal radial pulses bilaterally.      Peripheral Circulation: no clubbing, cyanosis,  with normal capillary refill.     EKG  Procedure date:  08/05/2010  Findings:      Sinus rhythm Rate 67, Right Axis Deviation, QTC Prolonged, No Acute ST-T wave Changes  PPM Specifications Following MD:  Sherryl Manges, MD     Referring MD:  [facility] PPM Vendor:  [employer]     PPM Model Number:  [id number]     PPM Serial Number:  [id number] PPM DOI:  02/09/2005      Lead 1    Location: RA     DOI: 02/09/2005     Model #: 9147     Serial #: [id number]     Status: active Lead 2    Location: RV     DOI: 02/09/2005     Model #: 6578     Serial #: [id number]     Status: active   Indications:  SINUS NODE DYSFUNCTION    PPM Follow Up Pacer Dependent:  Yes      Episodes Coumadin:  Yes  Parameters Mode:  MVP     Lower Rate Limit:  60     Upper Rate Limit:  140 Paced AV Delay:  180     Sensed AV Delay:  300  Impression & Recommendations:  Problem # 1:  CARDIOMYOPATHY (ICD-425.4) He has no new symptoms. He has not tolerated meds titration in the future because predominately of fatigue and some hypotension. He will continue the meds as listed. Orders: Echocardiogram (Echo)  Problem # 2:  CHRONIC KIDNEY DISEASE  UNSPECIFIED (ICD-585.9) His creatinine was followed and was last to 2.8. This is stable.  Problem # 3:  AAA (ICD-441.4) This is followed by Dr. Darrick Penna.  Problem # 4:  MITRAL REGURGITATION (ICD-396.3) I will reassess this and his ejection fraction with an echocardiogram in March. This will be 2 years from the date of the previous echo.  Problem # 5:  HYPERLIPIDEMIA (ICD-272.4) I will asked that a lipid profile be drawn. I think the LDL should be less than 70 with an HDL 50  as the goal in this high risk patient.  Problem # 6:  MORBID OBESITY (ICD-278.01) We had a long discussion about this. I'm. His weight his weight and have suggested specific weight loss instructions primarily centering around eating less asked to  Other Orders: EKG w/ Interpretation (93000)  Patient Instructions: 1)  Your physician recommends that you schedule a follow-up appointment in: 6 months with Dr Antoine Poche 2)  Your physician recommends that you continue on your current medications as directed. Please refer to the Current Medication list given to you today. 3)  Your physician has requested that you have an echocardiogram (due March 2012).  Echocardiography is a painless test that uses sound waves to create images of your heart. It provides your doctor with information about the size and shape of your heart and how well your heart's chambers and valves are working.  This procedure takes approximately one hour. There are no restrictions for this procedure.    Colorectal Screening   Hemoccult: Not documented    Colonoscopy: Not documented    Lipids   Total Cholesterol: 135  (11/04/2008)   LDL: 82  (11/04/2008)   LDL Direct: Not documented   HDL: 34  (11/04/2008)   Triglycerides: Not documented    SGOT (AST): 23  (06/02/2010)   SGPT (ALT): 21  (06/02/2010)   Alkaline phosphatase: 54  (06/02/2010)   Total bilirubin: 0.4  (06/02/2010)   Self-Management Support :    Hypertension self-management support:  Not documented    Lipid self-management support: Not documented

## 2010-09-28 NOTE — Letter (Signed)
Summary: [facility] Office Note  [facility] Office Note   Imported By: Roderic Ovens 01/13/2010 14:28:23  _____________________________________________________________________  External Attachment:    Type:   Image     Comment:   External Document

## 2010-09-28 NOTE — Assessment & Plan Note (Signed)
Summary: PC2   Visit Type:  Follow-up Referring Provider:  Berton Mount, MD Primary Provider:  [name], MD   History of Present Illness: The patient presents today for routine electrophysiology followup. He reports doing very well since last being seen in our clinic.  His PVCs have significantly improved with amiodraone therapy.  The patient denies symptoms of palpitations, chest pain,dizziness, presyncope, syncope, or neurologic sequela.  His SOB and fatigue have significantly improved.  He was recently seen by Dr Sullivan Lone who added metolazone for edema.  The patient is tolerating medications without difficulties and is otherwise without complaint today.   Current Medications (verified): 1)  Crestor 10 Mg  Tabs (Rosuvastatin Calcium) .... Take 1 Tablet By Mouth Once A Day 2)  Klor-Con M10 10 Meq  Tbcr (Potassium Chloride Crys Cr) .... Take2  Tablet By Mouth Two Times A Day 3)  Furosemide 80 Mg  Tabs (Furosemide) .... Take 1 Tablet By Mouth 2 Times A Day 4)  Flonase 50 Mcg/act  Susp (Fluticasone Propionate) .... As Needed 5)  Aspirin Low Dose 81 Mg  Tabs (Aspirin) .... Take 1 Tablet By Mouth Once A Day 6)  Calcitriol 0.5 Mcg  Caps (Calcitriol) .... Take 1 Tablet By Mouth Once A Day 7)  Carvedilol 6.25 Mg  Tabs (Carvedilol) .... Take 1 Tablet By Mouth Two Times A Day 8)  Hydralazine Hcl 25 Mg Tabs (Hydralazine Hcl) .[name].. 1 1/2 By Mouth Two Times A Day 9)  Docusate Sodium 100 Mg  Caps (Docusate Sodium) .... Take 1 Tablet By Mouth Two Times A Day 10)  Warfarin Sodium 2 Mg Tabs (Warfarin Sodium) .... Use As Directed By Anticoagualtion Clinic 11)  Flomax 0.4 Mg Xr24h-Cap (Tamsulosin Hcl) .... One By Mouth Daily 12)  Novolog 100 Unit/ml Soln (Insulin Aspart) .... As Directed (15 Units Twice Daily) 13)  Isosorbide Dinitrate 30 Mg Tabs (Isosorbide Dinitrate) .... One By Mouth Two Times A Day 14)  Omeprazole 20 Mg Tbec (Omeprazole) .... 3 Times A Week 15)  Amiodarone Hcl 200 Mg Tabs (Amiodarone  Hcl) .... One By Mouth Two Times A Day 16)  Metolazone 2.5 Mg Tabs (Metolazone) .... Take One Tablet By Mouth Once Daily.  Allergies (verified): 1)  ! * Shellfish  Past History:  Past Medical History: Reviewed history from 09/27/2009 and no changes required. Cardiomyopathy with an ejection fraction of approximately 40% Coronary artery disease s/p CABG 2006 Mitral regurgitation with  mitral valve repair Sick sinus syndrome status post pacemaker placement 6/06 Frequent premature ventricular contractions s/p ablation 7/10 Sleep apnea not tolerant of CPAP Chronic renal insufficiency Diabetes mellitus,  Hypertension Benign prostatic hypertrophy Anemia Deep venous thrombosis on chronic Coumadin therapy.   Past Surgical History: Reviewed history from 04/21/2009 and no changes required. Status post CABG (LIMA to LAD, SVG to Circ, SVG to PL) 2006 Mitral valve repair with a #28 Edwards logic ring, Status post permanent pacemaker AAA s/p repair May 2008 Ablation for PVCs 7/10  Social History: Reviewed history from 03/12/2009 and no changes required. The patient lives with his girlfriend.  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  He quit smoking in 1988 after a 17-pack- year history.  He occasionally drinks alcohol.  None since his surgery in June.  No illicit drug use.  Review of Systems       All systems are reviewed and negative except as listed in the HPI.   Vital Signs:  Patient profile:   75 year old male Height:  72 inches Weight:      263 pounds BMI:     35.80 Pulse rate:   62 / minute BP sitting:   86 / 50  (left arm)  Vitals Entered By: Laurance Flatten CMA (November 26, 2009 10:05 AM)  Physical Exam  General:  chronically ill, obese Head:  normocephalic and atraumatic Eyes:  PERRLA/EOM intact; conjunctiva and lids normal. Mouth:  Teeth, gums and palate normal. Oral mucosa normal. Neck:  Neck supple, no JVD. No masses, thyromegaly or abnormal cervical  nodes. Lungs:  Clear bilaterally to auscultation and percussion. Heart:  RRR, .  S1, S2.  NO S3.  NO significant murmurs. Abdomen:  Bowel sounds positive; abdomen soft and non-tender without masses, organomegaly, or hernias noted. No hepatosplenomegaly. Msk:  Back normal, normal gait. Muscle strength and tone normal. Pulses:  deminished pedal pulses Extremities:  bilateral mild lower extremity edema, chronic venous stasis changes Neurologic:  Alert and oriented x 3. Skin:  Intact without lesions or rashes. Cervical Nodes:  no significant adenopathy Psych:  Normal affect.   PPM Specifications Following MD:  Sherryl Manges, MD     Referring MD:  [facility] PPM Vendor:  [employer]     PPM Model Number:  [id number]     PPM Serial Number:  [id number] PPM DOI:  02/09/2005      Lead 1    Location: RA     DOI: 02/09/2005     Model #: 1308     Serial #: [id number]     Status: active Lead 2    Location: RV     DOI: 02/09/2005     Model #: [id number]     Serial #: [id number]     Status: active   Indications:  SINUS NODE DYSFUNCTION    PPM Follow Up Remote Check?  No Battery Voltage:  3.00 V     Pacer Dependent:  Yes       PPM Device Measurements Atrium  Amplitude: 2.7 mV, Impedance: 528 ohms, Threshold: 1.0 V at 0.4 msec Right Ventricle  Amplitude: 19.7 mV, Impedance: 432 ohms, Threshold: 0.5 V at 0.4 msec  Episodes MS Episodes:  0     Percent Mode Switch:  0     Coumadin:  Yes Atrial Pacing:  99.6%     Ventricular Pacing:  7.4%  Parameters Mode:  DDIR     Lower Rate Limit:  70     Upper Rate Limit:  140 Paced AV Delay:  180     Next Cardiology Appt Due:  05/28/2010 Tech Comments:  No parameter changes.  PVC's/hr. down from 60.1 in January to 8.5 2/28-4/1.  Marco [name] continues on his Aminodarone.  ROV 6months. Altha Harm, LPN  November 27, 2534 10:26 AM  MD Comments:  agree.  PVC burden is much improved (now 8 pvcs per hour)  Impression & Recommendations:  Problem # 1:  PAROXYSMAL VENTRICULAR  TACHYCARDIA (ICD-427.1)  PVCs much improved with amiodarone. Decrease amiodarone to 200mg  daily.  Check LFTs and TSH today. Given multiple comorbidities and difficult access to PVC source, I would avoid another ablation if able.  Orders: TLB-BMP (Basic Metabolic Panel-BMET) (80048-METABOL) TLB-Hepatic/Liver Function Pnl (80076-HEPATIC) TLB-TSH (Thyroid Stimulating Hormone) (84443-TSH)  Problem # 2:  CARDIOMYOPATHY (ICD-425.4) stable, without symptoms of CAD/ ischemia. no changes today  Problem # 3:  HYPERTENSION (ICD-401.9) BP is low. Pt to check BP at home.  I am concerned that metolazone is contributing to this. He may have to stop metolazone.  I will check BMP today.  Problem # 4:  CHRONIC KIDNEY DISEASE UNSPECIFIED (ICD-585.9) careful with metolazone he will follow up with Dr Caryn Section soon.  Problem # 5:  AAA (ICD-441.4) pt has follow-up with Dr Odis Luster soon.  Patient Instructions: 1)  Your physician recommends that you schedule a follow-up appointment in: 6months with Dr Johney Frame  move Dr Lindaann Slough appointment to June  2)  Your physician has recommended you make the following change in your medication: decrease Amiodarone 200mg  daily 3)  Your physician recommends that you return for lab work today

## 2010-09-28 NOTE — Cardiovascular Report (Signed)
Summary: Office Visit Remote  Office Visit Remote   Imported By: Roderic Ovens 10/28/2009 14:11:13  _____________________________________________________________________  External Attachment:    Type:   Image     Comment:   External Document

## 2010-09-28 NOTE — Letter (Signed)
Summary: [facility]  [facility]   Imported By: Marylou Mccoy 04/04/2010 15:32:15  _____________________________________________________________________  External Attachment:    Type:   Image     Comment:   External Document

## 2010-09-28 NOTE — Assessment & Plan Note (Signed)
Summary: per check out/sf   Visit Type:  Follow-up Primary Provider:  [name], MD  CC:  CAD.  History of Present Illness: The  patient presents for followup of his multiple cardiovascular issues listed below. I'm pleased to see that he has been doing quite well since I last saw him. He denies any new chest pressure, neck or arm discomfort. He has had no palpitations, presyncope or syncope. He is walking on a treadmill a couple of times per week. He says his energy level is better. He is less dyspneic. He actually has had his dose of diuretic reduced recently. He is watching his diet. With all of this his weight has started to come down.  Current Medications (verified): 1)  Crestor 10 Mg  Tabs (Rosuvastatin Calcium) .... Take 1 Tablet By Mouth Once A Day 2)  Klor-Con M10 10 Meq  Tbcr (Potassium Chloride Crys Cr) .... Take2  Tablet By Mouth Two Times A Day 3)  Furosemide 80 Mg  Tabs (Furosemide) .... Take 1 Tablet By Mouth 2 Times A Day 4)  Flonase 50 Mcg/act  Susp (Fluticasone Propionate) .... As Needed 5)  Aspirin Low Dose 81 Mg  Tabs (Aspirin) .... Take 1 Tablet By Mouth Once A Day 6)  Calcitriol 0.5 Mcg  Caps (Calcitriol) .... Take 1 Tablet By Mouth Once A Day 7)  Carvedilol 6.25 Mg  Tabs (Carvedilol) .... Take 1 Tablet By Mouth Two Times A Day 8)  Hydralazine Hcl 25 Mg Tabs (Hydralazine Hcl) .... 1/2 By Mouth Two Times A Day 9)  Docusate Sodium 100 Mg  Caps (Docusate Sodium) .... Take 1 Tablet By Mouth Two Times A Day 10)  Warfarin Sodium 2 Mg Tabs (Warfarin Sodium) .... Use As Directed By Anticoagualtion Clinic 11)  Flomax 0.4 Mg Xr24h-Cap (Tamsulosin Hcl) .... One By Mouth Daily 12)  Novolog 100 Unit/ml Soln (Insulin Aspart) .... As Directed (15 Units Twice Daily) 13)  Isosorbide Dinitrate 30 Mg Tabs (Isosorbide Dinitrate) .... One By Mouth Two Times A Day 14)  Omeprazole 20 Mg Tbec (Omeprazole) .... 3 Times A Week 15)  Amiodarone Hcl 200 Mg Tabs (Amiodarone Hcl) .... One By  Mouth Daily  Allergies (verified): 1)  ! * Shellfish  Past History:  Past Medical History: Reviewed history from 09/27/2009 and no changes required. Cardiomyopathy with an ejection fraction of approximately 40% Coronary artery disease s/p CABG 2006 Mitral regurgitation with  mitral valve repair Sick sinus syndrome status post pacemaker placement 6/06 Frequent premature ventricular contractions s/p ablation 7/10 Sleep apnea not tolerant of CPAP Chronic renal insufficiency Diabetes mellitus,  Hypertension Benign prostatic hypertrophy Anemia Deep venous thrombosis on chronic Coumadin therapy.   Past Surgical History: Reviewed history from 04/21/2009 and no changes required. Status post CABG (LIMA to LAD, SVG to Circ, SVG to PL) 2006 Mitral valve repair with a #28 Edwards logic ring, Status post permanent pacemaker AAA s/p repair May 2008 Ablation for PVCs 7/10  Review of Systems       As stated in the HPI and negative for all other systems.   Vital Signs:  Patient profile:   75 year old male Height:      72 inches Weight:      262 pounds BMI:     35.66 Pulse rate:   68 / minute Resp:     16 per minute BP sitting:   134 / 78  (right arm)  Vitals Entered By: Marrion Coy, CNA (February 01, 2010 9:52 AM)  Physical Exam  General:  chronically ill, obese Head:  normocephalic and atraumatic Eyes:  PERRLA/EOM intact; conjunctiva and lids normal. Neck:  Neck supple, no JVD. No masses, thyromegaly or abnormal cervical nodes. Chest Wall:  well-healed sternotomy scar and pacemaker pocket Lungs:  Clear bilaterally to auscultation and percussion. Abdomen:  Bowel sounds positive; abdomen soft and non-tender without masses, organomegaly, or hernias noted. No hepatosplenomegaly, obese Msk:  Back normal, normal gait. Muscle strength and tone normal. Extremities:  bilateral mild lower extremity edema, chronic venous stasis changes Neurologic:  Alert and oriented x 3. Psych:  Normal  affect.   Detailed Cardiovascular Exam  Neck    Carotids: Carotids full and equal bilaterally without bruits.      Neck Veins: Normal, no JVD.    Heart    Inspection: no deformities or lifts noted.      Palpation: normal PMI with no thrills palpable.      Auscultation: regular rate and rhythm, S1, S2 without murmurs, rubs, gallops, or clicks.    Vascular    Abdominal Aorta: no palpable masses, pulsations, or audible bruits.      Femoral Pulses: normal femoral pulses bilaterally.      Pedal Pulses: deminished pedal pulses    Radial Pulses: normal radial pulses bilaterally.      Peripheral Circulation: no clubbing, cyanosis,  with normal capillary refill.     EKG  Procedure date:  02/01/2010  Findings:      Atrial paced rhythm, no acute ST-T wave changes normal magnet function,  PPM Specifications Following MD:  Sherryl Manges, MD     Referring MD:  [facility] PPM Vendor:  [employer]     PPM Model Number:  [id number]     PPM Serial Number:  [id number] PPM DOI:  02/09/2005      Lead 1    Location: RA     DOI: 02/09/2005     Model #: [id number]     Serial #: [id number]     Status: active Lead 2    Location: RV     DOI: 02/09/2005     Model #: 6962     Serial #: [id number]     Status: active   Indications:  SINUS NODE DYSFUNCTION    PPM Follow Up Pacer Dependent:  Yes      Episodes Coumadin:  Yes  Parameters Mode:  DDIR     Lower Rate Limit:  70     Upper Rate Limit:  140 Paced AV Delay:  180     Impression & Recommendations:  Problem # 1:  CARDIOMYOPATHY (ICD-425.4) The patient's breathing is okay.I will not titrate his medications at this appointment but probably will in the future. I moving slowly because he's had tenuous blood pressures have been so fatigued in the recent past. He will continue the meds as listed and is encouraged to exercise more and continue to lose weight.  Problem # 2:  CHRONIC KIDNEY DISEASE UNSPECIFIED (ICD-585.9) His last creatinine was 3.0. He had he  was Zaroxolyn stopped. He is being followed by the nephrologists.  Problem # 3:  CORONARY ARTERY BYPASS GRAFT, HX OF (ICD-V45.81) He will continue with risk reduction. In particular I would be happy to review his lipids. Orders: EKG w/ Interpretation (93000)  Patient Instructions: 1)  Your physician recommends that you schedule a follow-up appointment in: 6 months with Dr Antoine Poche 2)  Your physician recommends that you continue on your current medications as directed. Please refer to the Current  Medication list given to you today.

## 2010-09-28 NOTE — Assessment & Plan Note (Signed)
Summary: Marco [name]'s/per [name]   Visit Type:  Follow-up Referring Provider:  Berton Mount, MD Primary Provider:  [name], MD  CC:  shortness of breath/weakness.  History of Present Illness: The patient presents today for electrophysiology followup.  Marco [name] continues to have symptomatic PVCs.  Marco [name] previously had EP study which revealed septal PVCs.  Ablation was performed from the RV side of the septum.  Unfortunatley, due to severe aortic/ iliac disaese, LV access could not be accomplished via a retrograde aortic approach.   Marco [name] did well initially but now presents with symptoms of shortness of breath, fatigue, and palpitations.  Marco [name] has frequent PVCs and is often in a bigeminal pattern. The patient denies symptoms of chest pain,  presyncope, syncope, or neurologic sequela.  Marco [name] has stable BLE edema but has increased his lasix on his own to 80mg  TID.  The patient is tolerating medications without difficulties and is otherwise without complaint today.   Current Medications (verified): 1)  Crestor 10 Mg  Tabs (Rosuvastatin Calcium) .... Take 1 Tablet By Mouth Once A Day 2)  Klor-Con M10 10 Meq  Tbcr (Potassium Chloride Crys Cr) .... Take2  Tablet By Mouth Two Times A Day 3)  Furosemide 80 Mg  Tabs (Furosemide) .... Take 1 Tablet By Mouth Three Times A Day 4)  Flonase 50 Mcg/act  Susp (Fluticasone Propionate) .... As Needed 5)  Aspirin Low Dose 81 Mg  Tabs (Aspirin) .... Take 1 Tablet By Mouth Once A Day 6)  Calcitriol 0.5 Mcg  Caps (Calcitriol) .... Take 1 Tablet By Mouth Once A Day 7)  Carvedilol 6.25 Mg  Tabs (Carvedilol) .... Take 1 Tablet By Mouth Two Times A Day 8)  Hydralazine Hcl 25 Mg Tabs (Hydralazine Hcl) .[name].. 1 1/2 By Mouth Two Times A Day 9)  Docusate Sodium 100 Mg  Caps (Docusate Sodium) .... Take 1 Tablet By Mouth Two Times A Day 10)  Warfarin Sodium 2 Mg Tabs (Warfarin Sodium) .... Use As Directed By Anticoagualtion Clinic 11)  Flomax 0.4 Mg Xr24h-Cap (Tamsulosin Hcl) .... One By  Mouth Daily 12)  Novolog 100 Unit/ml Soln (Insulin Aspart) .... As Directed (15 Units Twice Daily) 13)  Isosorbide Dinitrate 30 Mg Tabs (Isosorbide Dinitrate) .... One By Mouth Two Times A Day 14)  Omeprazole 20 Mg Tbec (Omeprazole) .... 3 Times A Week  Allergies: 1)  ! * Shellfish  Past History:  Past Medical History: Reviewed history from 09/27/2009 and no changes required. Cardiomyopathy with an ejection fraction of approximately 40% Coronary artery disease s/p CABG 2006 Mitral regurgitation with  mitral valve repair Sick sinus syndrome status post pacemaker placement 6/06 Frequent premature ventricular contractions s/p ablation 7/10 Sleep apnea not tolerant of CPAP Chronic renal insufficiency Diabetes mellitus,  Hypertension Benign prostatic hypertrophy Anemia Deep venous thrombosis on chronic Coumadin therapy.   Past Surgical History: Reviewed history from 04/21/2009 and no changes required. Status post CABG (LIMA to LAD, SVG to Circ, SVG to PL) 2006 Mitral valve repair with a #28 Edwards logic ring, Status post permanent pacemaker AAA s/p repair May 2008 Ablation for PVCs 7/10  Family History: Reviewed history from 03/12/2009 and no changes required. Marco [name] has three brothers who have all passed away from MIs in  their 56s or 71s.  Mother died of an MI in her 67s.  Father died of an MI at  69.  Marco [name] has a sister that is living with diabetes.  Social History: Reviewed history from 03/12/2009 and no changes required. The patient  lives with his girlfriend.  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  Marco [name] quit smoking in 1988 after a 17-pack- year history.  Marco [name] occasionally drinks alcohol.  None since his surgery in June.  No illicit drug use.  Review of Systems       All systems are reviewed and negative except as listed in the HPI.   Vital Signs:  Patient profile:   75 year old male Height:      72 inches Weight:      265 pounds BMI:     36.07 BP sitting:   100  / 62  (left arm)  Vitals Entered By: Laurance Flatten CMA (October 25, 2009 9:27 AM) CC: shortness of breath/weakness Research Study Name: In bigeminy with an ending rate of 50.   Physical Exam  General:  Well developed, well nourished, in no acute distress. Head:  normocephalic and atraumatic Eyes:  PERRLA/EOM intact; conjunctiva and lids normal. Mouth:  Teeth, gums and palate normal. Oral mucosa normal. Neck:  Neck supple, no JVD. No masses, thyromegaly or abnormal cervical nodes. Lungs:  Clear bilaterally to auscultation and percussion. Heart:  RRR,  with frequent ectopy.  S1, S2.  NO S3.  NO significant murmurs. Abdomen:  Bowel sounds positive; abdomen soft and non-tender without masses, organomegaly, or hernias noted. No hepatosplenomegaly. Msk:  Back normal, normal gait. Muscle strength and tone normal. Pulses:  deminished pedal pulses Extremities:  bilateral mild lower extremity edema, chronic venous stasis changes Neurologic:  Alert and oriented x 3. Skin:  Intact without lesions or rashes. Cervical Nodes:  no significant adenopathy Psych:  Normal affect.   EKG  Procedure date:  10/25/2009  Findings:      a paced with PVCs in begimeny.  PVCs are of a LBB L Superior axis.  PPM Specifications Following MD:  Sherryl Manges, MD     Referring MD:  [facility] PPM Vendor:  [employer]     PPM Model Number:  [id number]     PPM Serial Number:  [id number] PPM DOI:  02/09/2005      Lead 1    Location: RA     DOI: 02/09/2005     Model #: [id number]     Serial #: [id number]     Status: active Lead 2    Location: RV     DOI: 02/09/2005     Model #: [id number]     Serial #: [id number]     Status: active   Indications:  SINUS NODE DYSFUNCTION    PPM Follow Up Pacer Dependent:  Yes      Episodes Coumadin:  No  Parameters Mode:  DDIR     Lower Rate Limit:  70     Upper Rate Limit:  140 Paced AV Delay:  180     MD Comments:  interrogation performed today which reveals very frequent ventricular ecopy.   No VT> 4 beats.  No AF.  APVS 91%.  Impression & Recommendations:  Problem # 1:  PAROXYSMAL VENTRICULAR TACHYCARDIA (ICD-427.1) The patient presents today for further EP consultation regarding his very symptomatic PVCs.  Therapeutic strategies for PVCs including medicine and ablation were discussed in detail with the patient today. Risk, benefits, and alternatives to EP study and radiofrequency ablation were also discussed in detail today.  The patient understands these risk and wishes to try medical therapy again first.  Given his elevated creatinine, Marco [name] is not a candidate for sotalol.  I have therefore offered amiodarone for suppression  of his ventricular arrhythmias.  R/B/A to Amiodarone therapy were discussed at length.  We will obtain LFTs, TFTs today.  Pt is aware to have his INR followed closely due to risks for supratherapeutic INR.  The patient is clear in his decision to try medical therapy with amiodarone at this time.  We will therefore start amiodarone 200mg  two times a day x 4 wks, then 200mg  daily. IF Marco [name] fails medical therapy with amiodarone, then Marco [name] will again consider ablation.  Repeat ablation would likely require a trans-septal catheter approach.  Problem # 2:  AAA (ICD-441.4) Stable s/p repair.  Marco [name] has severe aortofemoral disease but minimal claudication. Further evaluation (angiogram) is limited by renal failure.  Marco [name] will follow-up with Dr Antoine Poche.  Problem # 3:  CARDIOMYOPATHY (ICD-425.4) stable salt restriction is advised We will check BMET today  His updated medication list for this problem includes:    Furosemide 80 Mg Tabs (Furosemide) .[name]... Take 1 tablet by mouth three times a day    Aspirin Low Dose 81 Mg Tabs (Aspirin) .[name]... Take 1 tablet by mouth once a day    Carvedilol 6.25 Mg Tabs (Carvedilol) .[name]... Take 1 tablet by mouth two times a day    Warfarin Sodium 2 Mg Tabs (Warfarin sodium) ..... Use as directed by anticoagualtion clinic    Isosorbide Dinitrate 30 Mg  Tabs (Isosorbide dinitrate) ..... One by mouth two times a day    Amiodarone Hcl 200 Mg Tabs (Amiodarone hcl) ..... One by mouth two times a day  Orders: TLB-BMP (Basic Metabolic Panel-BMET) (80048-METABOL) TLB-CBC Platelet - w/Differential (85025-CBCD) TLB-Hepatic/Liver Function Pnl (80076-HEPATIC) TLB-T4 (Thyrox), Free [phone]) TLB-TSH (Thyroid Stimulating Hormone) (84443-TSH)  Problem # 4:  CAD (ICD-414.00) No symptoms of ischemia  His updated medication list for this problem includes:    Aspirin Low Dose 81 Mg Tabs (Aspirin) .[name]... Take 1 tablet by mouth once a day    Carvedilol 6.25 Mg Tabs (Carvedilol) .[name]... Take 1 tablet by mouth two times a day    Warfarin Sodium 2 Mg Tabs (Warfarin sodium) ..... Use as directed by anticoagualtion clinic    Isosorbide Dinitrate 30 Mg Tabs (Isosorbide dinitrate) ..... One by mouth two times a day  Problem # 5:  HYPERTENSION (ICD-401.9) stable  His updated medication list for this problem includes:    Furosemide 80 Mg Tabs (Furosemide) .[name]... Take 1 tablet by mouth three times a day    Aspirin Low Dose 81 Mg Tabs (Aspirin) .[name]... Take 1 tablet by mouth once a day    Carvedilol 6.25 Mg Tabs (Carvedilol) .[name]... Take 1 tablet by mouth two times a day    Hydralazine Hcl 25 Mg Tabs (Hydralazine hcl) .[name]... 1 1/2 by mouth two times a day  Orders: TLB-BMP (Basic Metabolic Panel-BMET) (80048-METABOL) TLB-CBC Platelet - w/Differential (85025-CBCD) TLB-Hepatic/Liver Function Pnl (80076-HEPATIC) TLB-T4 (Thyrox), Free [phone]) TLB-TSH (Thyroid Stimulating Hormone) (84443-TSH)  Problem # 6:  HYPERLIPIDEMIA (ICD-272.4) stable  His updated medication list for this problem includes:    Crestor 10 Mg Tabs (Rosuvastatin calcium) .[name]... Take 1 tablet by mouth once a day  Other Orders: EKG w/ Interpretation (93000)  Patient Instructions: 1)  Your physician recommends that you schedule a follow-up appointment in: 4 weeks with Dr Johney Frame 2)  Your  physician has recommended you make the following change in your medication: start Amiodarone 200mg  two times a day 3)  Your physician recommends that you return for lab work today Prescriptions: AMIODARONE HCL 200 MG TABS (AMIODARONE HCL) one  by mouth two times a day  #60 x 6   Entered by:   Dennis Bast, RN, BSN   Authorized by:   Hillis Range, MD   Signed by:   Dennis Bast, RN, BSN on 10/25/2009   Method used:   Electronically to        [facility]. [phone]* (retail)       [address]       [place], [place]  [address]       Ph: [phone]       Fax: [phone]   RxID:   [id number]

## 2010-09-28 NOTE — Assessment & Plan Note (Signed)
Summary: rov/jml   Visit Type:  Follow-up Primary Provider:  [name], MD   History of Present Illness: The patient returns for followup. He was seen here in mid December as an add on. He was complaining of increased weakness.He was noted to have increased premature ventricular contractions. No changes were made to his medical regimen. Since that time he has continued to have weakness. This has been a baseline problem for some time. However, he is now getting episodes of dizziness and weakness where he feels like he needs to sit and try not to walk. He is not having any presyncope or syncope. He does have dyspnea but this is at baseline. He is not describing PND or orthopnea. He does occasionally have some increased edema. If so he puts his legs up and takes an extra diuretic. He does have renal insufficiency but has had close followup of this. I have reviewed his most recent labs and his BUN was stable at 40 with a stable creatinine of 2.46. I reviewed his most recent echo done in the spring of 2010. His EF was 40% which was slightly better than previous.  He had his device interrogated today. It is working properly. However, 50% of his beats are premature ventricular contractions. He denies any chest pressure, neck or arm discomfort. He has had no fevers chills or cough. He has had no weight gain.  Current Medications (verified): 1)  Crestor 10 Mg  Tabs (Rosuvastatin Calcium) .... Take 1 Tablet By Mouth Once A Day 2)  Klor-Con M10 10 Meq  Tbcr (Potassium Chloride Crys Cr) .... Take2  Tablet By Mouth Two Times A Day 3)  Furosemide 80 Mg  Tabs (Furosemide) .... Take 1 Tablet By Mouth Three Times A Day 4)  Flonase 50 Mcg/act  Susp (Fluticasone Propionate) .... 2 Puffs in Each Nostril At Bedtime 5)  Aspirin Low Dose 81 Mg  Tabs (Aspirin) .... Take 1 Tablet By Mouth Once A Day 6)  Calcitriol 0.5 Mcg  Caps (Calcitriol) .... Take 1 Tablet By Mouth Once A Day 7)  Carvedilol 6.25 Mg  Tabs  (Carvedilol) .... Take 1 Tablet By Mouth Two Times A Day 8)  Hydralazine Hcl 25 Mg Tabs (Hydralazine Hcl) .[name].. 1 1/2 By Mouth Two Times A Day 9)  Docusate Sodium 100 Mg  Caps (Docusate Sodium) .... Take 1 Tablet By Mouth Two Times A Day 10)  Warfarin Sodium 3 Mg  Tabs (Warfarin Sodium) .... Take As Directed By Coumadin Clinic 11)  Flomax 0.4 Mg Xr24h-Cap (Tamsulosin Hcl) .... One By Mouth Daily 12)  Novolog 100 Unit/ml Soln (Insulin Aspart) .... As Directed (15 Units Twice Daily) 13)  Isosorbide Dinitrate 30 Mg Tabs (Isosorbide Dinitrate) .... One By Mouth Two Times A Day 14)  Omeprazole 20 Mg Tbec (Omeprazole) .... 3 Times A Week  Allergies (verified): 1)  ! * Shellfish  Past History:  Past Medical History: Cardiomyopathy with an ejection fraction of approximately 40% Coronary artery disease s/p CABG 2006 Mitral regurgitation with  mitral valve repair Sick sinus syndrome status post pacemaker placement 6/06 Frequent premature ventricular contractions s/p ablation 7/10 Sleep apnea not tolerant of CPAP Chronic renal insufficiency Diabetes mellitus,  Hypertension Benign prostatic hypertrophy Anemia Deep venous thrombosis on chronic Coumadin therapy.   Review of Systems       As stated in the HPI and negative for all other systems.   Vital Signs:  Patient profile:   75 year old male Height:  72 inches Weight:      265 pounds BMI:     36.07 Pulse rate:   44 / minute Resp:     16 per minute BP sitting:   106 / 55  (right arm)  Vitals Entered By: Marrion Coy, CNA (September 27, 2009 9:51 AM)  Physical Exam  General:  Well developed, well nourished, in no acute distress. Head:  normocephalic and atraumatic Eyes:  PERRLA/EOM intact; conjunctiva and lids normal. Mouth:  Teeth, gums and palate normal. Oral mucosa normal. Neck:  Neck supple, no JVD. No masses, thyromegaly or abnormal cervical nodes. Chest Wall:  well-healed sternotomy scar and pacemaker pocket Lungs:   Clear bilaterally to auscultation and percussion. Abdomen:  obese, positive bowel sounds normal in frequency in pitch, well-healed surgical scar, no rebound, no guarding, no hepatomegaly, no splenomegaly. Msk:  Back normal, normal gait. Muscle strength and tone normal. Extremities:  bilateral mild lower extremity edema, chronic venous stasis changes Neurologic:  Alert and oriented x 3. Skin:  Intact without lesions or rashes. Cervical Nodes:  no significant adenopathy Axillary Nodes:  no significant adenopathy Psych:  Normal affect.   Detailed Cardiovascular Exam  Neck    Carotids: Carotids full and equal bilaterally without bruits.      Neck Veins: Normal, no JVD.    Heart    Inspection: no deformities or lifts noted.      Palpation: normal PMI with no thrills palpable.      Auscultation: irrate and rhythm, S1, S2 without murmurs, rubs, gallops, or clicks.    Vascular    Abdominal Aorta: no palpable masses, pulsations, or audible bruits.      Femoral Pulses: normal femoral pulses bilaterally.      Pedal Pulses: normal pedal pulses bilaterally.      Radial Pulses: normal radial pulses bilaterally.     EKG  Procedure date:  09/27/2009  Findings:      sinus rhythm, premature ventricular complexes, rightward axis, poor anterior R-wave progression, no acute ST-T wave changes, atrial paced beats  PPM Specifications Following MD:  Sherryl Manges, MD     Referring MD:  [facility] PPM Vendor:  [employer]     PPM Model Number:  [id number]     PPM Serial Number:  [id number] PPM DOI:  02/09/2005      Lead 1    Location: RA     DOI: 02/09/2005     Model #: [id number]     Serial #: [id number]     Status: active Lead 2    Location: RV     DOI: 02/09/2005     Model #: 6213     Serial #: [id number]     Status: active   Indications:  SINUS NODE DYSFUNCTION    PPM Follow Up Remote Check?  No Battery Voltage:  2.99 V     Pacer Dependent:  Yes       PPM Device Measurements Atrium  Amplitude: 3.1  mV, Impedance: 504 ohms, Threshold: 1.0 V at 0.4 msec Right Ventricle  Amplitude: 16.4 mV, Impedance: 416 ohms, Threshold: 1.0 V at 0.4 msec  Episodes MS Episodes:  0     Coumadin:  No Ventricular High Rate:  1     Atrial Pacing:  98.4%     Ventricular Pacing:  93%  Parameters Mode:  DDIR     Lower Rate Limit:  70     Upper Rate Limit:  140 Paced AV Delay:  180     Tech  Comments:  Normal device About 50% of beats are PVC's.  Pt in underlying junctional rhythm today.  No changes made.  ROV per [facility]. Gypsy Balsam RN BSN  September 27, 2009 10:09 AM   Impression & Recommendations:  Problem # 1:  PAROXYSMAL VENTRICULAR TACHYCARDIA (ICD-427.1) The patient is having frequent ventricular ectopy. He did have an ablation of one focus of PVCs in July but there was another area that Dr. Johney Frame could not reach. He is symptomatic with these. We tried to over drive pace him but could only do so at a rate of 90. I chose not to do this. Rather I will talk with Dr. Johney Frame to see if we could consider another attempt at ablation versus possible treatment with amiodarone.  Problem # 2:  CHRONIC KIDNEY DISEASE UNSPECIFIED (ICD-585.9) I have reviewed recent labs and this is stable. He is followed by the nephrologist.  Problem # 3:  CARDIOMYOPATHY (ICD-425.4) At this point I will not titrate his meds further. He actually seems to be euvolemic. His blood pressure has been tenuous and he has not tolerated up titration in the past.  Problem # 4:  CORONARY ARTERY BYPASS GRAFT, HX OF (ICD-V45.81) He has no symptoms related to this. He will continue with risk reduction.  Other Orders: EKG w/ Interpretation (93000)  Patient Instructions: 1)  Your physician recommends that you schedule a follow-up appointment in: 2 months with Dr Antoine Poche 2)  Your physician recommends that you continue on your current medications as directed. Please refer to the Current Medication list given to you today.

## 2010-09-29 NOTE — Letter (Signed)
Summary: [facility] Patient Note   [facility] Patient Note   Imported By: Roderic Ovens 08/16/2010 14:11:20  _____________________________________________________________________  External Attachment:    Type:   Image     Comment:   External Document

## 2010-09-29 NOTE — Letter (Signed)
Summary: [facility] Journalist, newspaper, Main Office  [address]   [place], [place] [address]   Phone: [phone]  Fax: [phone]     September 06, 2010 MRN: [id number]   NELL SCHRACK [address] [place], [place]  [address]   Dear Mr. OSMOND,  According to our records, you were scheduled for a device phone transmission on 09-01-2010.     We did not receive any results from this check.  If you transmitted on your scheduled day, please call us to help troubleshoot your system.  If you forgot to send your transmission, please send one upon receipt of this letter.  Thank you,   Architectural technologist [facility]

## 2010-10-05 ENCOUNTER — Encounter (INDEPENDENT_AMBULATORY_CARE_PROVIDER_SITE_OTHER): Payer: Self-pay | Admitting: *Deleted

## 2010-10-13 NOTE — Letter (Signed)
Summary: Remote Device Check  [employer], Main Office  [address]   [place], [place] [address]   Phone: [phone]  Fax: [phone]     October 05, 2010 MRN: [id number]   STACIE KNUTZEN [address] [place], [place]  [address]   Dear Mr. GERLING,   Your remote transmission was recieved and reviewed by your physician.  All diagnostics were within normal limits for you.  __X___Your next transmission is scheduled for:   12-22-2010 .  Please transmit at any time this day.  If you have a wireless device your transmission will be sent automatically.   Sincerely,  Vella Kohler

## 2010-10-13 NOTE — Cardiovascular Report (Signed)
Summary: Office Visit Remote   Office Visit Remote   Imported By: Roderic Ovens 10/06/2010 15:32:02  _____________________________________________________________________  External Attachment:    Type:   Image     Comment:   External Document

## 2010-11-01 ENCOUNTER — Ambulatory Visit (HOSPITAL_COMMUNITY): Payer: Medicare Other | Attending: Cardiology

## 2010-11-01 DIAGNOSIS — I428 Other cardiomyopathies: Secondary | ICD-10-CM | POA: Insufficient documentation

## 2010-11-01 DIAGNOSIS — E669 Obesity, unspecified: Secondary | ICD-10-CM | POA: Insufficient documentation

## 2010-11-01 DIAGNOSIS — I059 Rheumatic mitral valve disease, unspecified: Secondary | ICD-10-CM | POA: Insufficient documentation

## 2010-11-01 DIAGNOSIS — I10 Essential (primary) hypertension: Secondary | ICD-10-CM | POA: Insufficient documentation

## 2010-11-01 DIAGNOSIS — E119 Type 2 diabetes mellitus without complications: Secondary | ICD-10-CM | POA: Insufficient documentation

## 2010-11-01 DIAGNOSIS — I2510 Atherosclerotic heart disease of native coronary artery without angina pectoris: Secondary | ICD-10-CM | POA: Insufficient documentation

## 2010-11-17 ENCOUNTER — Encounter: Payer: Self-pay | Admitting: Cardiology

## 2010-11-17 ENCOUNTER — Ambulatory Visit (INDEPENDENT_AMBULATORY_CARE_PROVIDER_SITE_OTHER): Payer: Medicare Other | Admitting: Cardiology

## 2010-11-17 DIAGNOSIS — I428 Other cardiomyopathies: Secondary | ICD-10-CM

## 2010-11-17 DIAGNOSIS — I10 Essential (primary) hypertension: Secondary | ICD-10-CM

## 2010-11-17 DIAGNOSIS — I2510 Atherosclerotic heart disease of native coronary artery without angina pectoris: Secondary | ICD-10-CM

## 2010-11-17 DIAGNOSIS — E785 Hyperlipidemia, unspecified: Secondary | ICD-10-CM

## 2010-11-17 DIAGNOSIS — I509 Heart failure, unspecified: Secondary | ICD-10-CM

## 2010-11-17 MED ORDER — CARVEDILOL 3.125 MG PO TABS: 3.1250 mg | ORAL_TABLET | Freq: Two times a day (BID) | ORAL | Status: DC

## 2010-11-17 NOTE — Assessment & Plan Note (Signed)
He has been very sensitive to medications with hypotension and fatigue. I am going to try to creep up on his carvedilol to 9.375 b.i.d. I would discontinue hydralazine

## 2010-11-17 NOTE — Assessment & Plan Note (Signed)
He says his blood pressure runs low at home. I will make adjustments as above.

## 2010-11-17 NOTE — Patient Instructions (Signed)
Increase Carvedilol by 3.125 mg twice a day Stop Hydralazine See Dr Antoine Poche in 2 months

## 2010-11-17 NOTE — Assessment & Plan Note (Signed)
We continue to discuss the need for weight loss as this is a severe problem.

## 2010-11-17 NOTE — Progress Notes (Signed)
HPI The patient was called to come in today to review his recent echocardiogram. His ejection fraction which had been about 40% is now listed at 25-30%. I reviewed all of his previous echoes and he has been 25-30% some years ago and fluctuating as high as 40. I dissected his ejection fraction has not improved if not slightly lower than previous and I wanted to assess symptoms and adjust medications. He denies any new shortness of breath, PND or orthopnea. He has no new palpitations, presyncope or syncope. He has no new weight gain or edema but unfortunately has not lost weight. He is trying to exercise and wants to be on a diet regimen. He continues to have poor sleep anxiety and fatigue.  No Known Allergies  Current outpatient prescriptions:amiodarone (PACERONE) 200 MG tablet, Take 200 mg by mouth daily.  , Disp: , Rfl: ;  aspirin 81 MG tablet, Take 81 mg by mouth daily.  , Disp: , Rfl: ;  CALCITRIOL PO, Take by mouth. 1 po daily , Disp: , Rfl: ;  carvedilol (COREG) 6.25 MG tablet, Take 6.25 mg by mouth 2 (two) times daily with a meal.  , Disp: , Rfl: ;  docusate sodium (COLACE) 100 MG capsule, Take 100 mg by mouth 2 (two) times daily.  , Disp: , Rfl:  fluticasone (FLONASE) 50 MCG/ACT nasal spray, 2 sprays by Nasal route daily.  , Disp: , Rfl: ;  furosemide (LASIX) 80 MG tablet, Take 80 mg by mouth 2 (two) times daily.  , Disp: , Rfl: ;  hydrALAZINE (APRESOLINE) 25 MG tablet, 25 mg. 1/2 po bid , Disp: , Rfl: ;  insulin aspart (NOVOLOG) 100 UNIT/ML injection, Inject into the skin. As directed , Disp: , Rfl: ;  isosorbide dinitrate (ISORDIL) 30 MG tablet, Take 30 mg by mouth. Bid , Disp: , Rfl:  omeprazole (PRILOSEC) 20 MG capsule, 20 mg. 1 po 3 times a week , Disp: , Rfl: ;  potassium chloride (KLOR-CON) 10 MEQ CR tablet, Take 10 mEq by mouth daily.  , Disp: , Rfl: ;  rosuvastatin (CRESTOR) 10 MG tablet, Take 10 mg by mouth daily.  , Disp: , Rfl: ;  Tamsulosin HCl (FLOMAX) 0.4 MG CAPS, Take 0.4 mg by mouth. 1  po daily , Disp: , Rfl: ;  warfarin (COUMADIN) 2 MG tablet, 2 mg. As directed , Disp: , Rfl:    Past Medical History  Diagnosis Date  . Cardiomyopathy     EF 25-30%  . Coronary artery disease      The patient had stent to the right coronary artery in 1999.  The patient had unstable angina.  Non-Q-wave myocardial infarction in June 2006.  Coronary artery bypass graft surgery on February 01, 2005, with  mitral valve annuloplasty with placement of a 28-mm Edwards   ETlogix ring.     . Arrhythmia      Sick sinus syndrome/sinus node dysfunction after coronary   artery bypass graft/mitral valve replacement  . Mitral regurgitation   . Status post biventricular cardiac pacemaker insertion     Medtronic EnRhythm dual-chamber pacemaker implanted on February 09, 2005.   . Diabetes mellitus   . Hypertension   . Hyperlipidemia   . BPH (benign prostatic hypertrophy)   . Renal insufficiency   . GERD (gastroesophageal reflux disease)   . CHF (congestive heart failure)     New York Heart Association class II-III   . Obesity   . DVT (deep venous thrombosis)   .  Skin cancer     Past Surgical History  Procedure Date  . Coronary artery bypass graft     CABG with a  LIMA to the LAD, SVG to circumflex, SVG to posterolateral June2006.  . Mitral valve repair      mitral valve repair with a #28 Edwards logic ring  . Pacemaker placement   . Abdominal aortic aneurysm repair      May 2008,     ROS As stated in the HPI and negative for all other systems.  BP 140/72  Pulse 66  Resp 16  Ht 6' (1.829 m)  Wt 280 lb (127.007 kg)  BMI 37.97 kg/m2  PHYSICAL EXAM HEENT:  Pupils equal round and reactive, fundi not visualized, oral mucosa unremarkable NECK:  No jugular venous distention, waveform within normal limits, carotid upstroke brisk and symmetric, no bruits, no thyromegaly LYMPHATICS:  No cervical, axillary adenopathy LUNGS:  Clear to auscultation bilaterally BACK:  No CVA tenderness CHEST:  Well  healed sternotomy scar. HEART:  PMI not displaced or sustained,,S1 and S2 within normal limits, no S3, no S4, no clicks, no rubs, no murmurs ABD:  Flat, positive bowel sounds normal in frequency in pitch, no bruits, no rebound, no guarding, no midline pulsatile mass, no hepatomegaly, no splenomegaly, obese, surgical scar EXT:  2 plus pulses throughout, mild edema, no cyanosis no clubbing, chronic venous stasis changes SKIN:  No rashes no nodules NEURO:  Cranial nerves II through XII grossly intact, motor grossly intact throughout PSYCH:  Cognitively intact, oriented to person place and time  EKG:

## 2010-11-17 NOTE — Assessment & Plan Note (Signed)
This is followed by his primary provider with a goal LDL less than 70 and HDL greater than 40.

## 2010-11-24 ENCOUNTER — Telehealth: Payer: Self-pay | Admitting: Internal Medicine

## 2010-11-24 NOTE — Telephone Encounter (Signed)
LOV faxed to [facility] @ [phone] 11/24/10/KM

## 2010-12-04 ENCOUNTER — Other Ambulatory Visit: Payer: Self-pay | Admitting: Internal Medicine

## 2010-12-04 LAB — BASIC METABOLIC PANEL
Calcium: 8.4 mg/dL (ref 8.4–10.5)
GFR calc Af Amer: 30 mL/min — ABNORMAL LOW (ref >60)
GFR calc non Af Amer: 25 mL/min — ABNORMAL LOW (ref >60)
Potassium: 4.1 mEq/L (ref 3.5–5.1)
Sodium: 141 mEq/L (ref 135–145)

## 2010-12-04 LAB — PROTIME-INR
INR: 1.8 — ABNORMAL HIGH (ref 0.00–1.49)
Prothrombin Time: 21.6 seconds — ABNORMAL HIGH (ref 11.6–15.2)
Prothrombin Time: 21.8 seconds — ABNORMAL HIGH (ref 11.6–15.2)

## 2010-12-04 LAB — GLUCOSE, CAPILLARY
Glucose-Capillary: 110 mg/dL — ABNORMAL HIGH (ref 70–99)
Glucose-Capillary: 175 mg/dL — ABNORMAL HIGH (ref 70–99)

## 2010-12-07 LAB — APTT: aPTT: 47 seconds — ABNORMAL HIGH (ref 24–37)

## 2010-12-07 LAB — GLUCOSE, CAPILLARY
Glucose-Capillary: 120 mg/dL — ABNORMAL HIGH (ref 70–99)
Glucose-Capillary: 132 mg/dL — ABNORMAL HIGH (ref 70–99)

## 2010-12-07 LAB — CBC
Hemoglobin: 10.8 g/dL — ABNORMAL LOW (ref 13.0–17.0)
MCHC: 33.1 g/dL (ref 30.0–36.0)
Platelets: 161 10*3/uL (ref 150–400)
Platelets: 190 10*3/uL (ref 150–400)
RDW: 17 % — ABNORMAL HIGH (ref 11.5–15.5)
RDW: 17.7 % — ABNORMAL HIGH (ref 11.5–15.5)
WBC: 7.1 10*3/uL (ref 4.0–10.5)

## 2010-12-07 LAB — COMPREHENSIVE METABOLIC PANEL
AST: 15 U/L (ref 0–37)
Albumin: 3.2 g/dL — ABNORMAL LOW (ref 3.5–5.2)
Alkaline Phosphatase: 80 U/L (ref 39–117)
BUN: 39 mg/dL — ABNORMAL HIGH (ref 6–23)
Chloride: 104 mEq/L (ref 96–112)
GFR calc Af Amer: 31 mL/min — ABNORMAL LOW (ref >60)
Potassium: 4.4 mEq/L (ref 3.5–5.1)
Total Bilirubin: 0.6 mg/dL (ref 0.3–1.2)
Total Protein: 7 g/dL (ref 6.0–8.3)

## 2010-12-07 LAB — BASIC METABOLIC PANEL
BUN: 41 mg/dL — ABNORMAL HIGH (ref 6–23)
CO2: 31 mEq/L (ref 19–32)
Calcium: 8.7 mg/dL (ref 8.4–10.5)
Creatinine, Ser: 2.5 mg/dL — ABNORMAL HIGH (ref 0.4–1.5)
GFR calc non Af Amer: 26 mL/min — ABNORMAL LOW (ref >60)
Glucose, Bld: 112 mg/dL — ABNORMAL HIGH (ref 70–99)

## 2010-12-07 LAB — URINALYSIS, ROUTINE W REFLEX MICROSCOPIC
Bilirubin Urine: NEGATIVE
Hgb urine dipstick: NEGATIVE
Nitrite: NEGATIVE
Specific Gravity, Urine: 1.012 (ref 1.005–1.030)
Urobilinogen, UA: 1 mg/dL (ref 0.0–1.0)
pH: 7 (ref 5.0–8.0)

## 2010-12-07 LAB — PROTIME-INR: INR: 2.5 — ABNORMAL HIGH (ref 0.00–1.49)

## 2010-12-07 LAB — TYPE AND SCREEN
ABO/RH(D): O POS
Antibody Screen: NEGATIVE

## 2010-12-20 ENCOUNTER — Other Ambulatory Visit: Payer: Self-pay | Admitting: Cardiology

## 2010-12-22 ENCOUNTER — Other Ambulatory Visit: Payer: Self-pay | Admitting: Internal Medicine

## 2010-12-22 ENCOUNTER — Ambulatory Visit (INDEPENDENT_AMBULATORY_CARE_PROVIDER_SITE_OTHER): Payer: Medicare Other | Admitting: *Deleted

## 2010-12-22 DIAGNOSIS — I499 Cardiac arrhythmia, unspecified: Secondary | ICD-10-CM

## 2010-12-22 DIAGNOSIS — I495 Sick sinus syndrome: Secondary | ICD-10-CM

## 2010-12-23 ENCOUNTER — Other Ambulatory Visit: Payer: Self-pay | Admitting: *Deleted

## 2010-12-23 MED ORDER — AMIODARONE HCL 200 MG PO TABS: 200.0000 mg | ORAL_TABLET | Freq: Every day | ORAL | Status: DC

## 2011-01-01 NOTE — Progress Notes (Signed)
Pacer remote check  

## 2011-01-04 ENCOUNTER — Encounter: Payer: Self-pay | Admitting: *Deleted

## 2011-01-10 NOTE — Assessment & Plan Note (Signed)
OFFICE VISIT   FORTINO, HAAG  DOB:  1936/08/06                                       12/30/2008  NFAOZ#:[id number]   The patient returns for followup today.  He underwent a right carotid  endarterectomy on 12/15/2008.  The lesion was fairly high in character  and he did have some hypoglossal nerve palsy postoperatively.  This has  improved somewhat since he left the hospital.  However, he still has  some difficulty with chewing and swallowing.  He states that this is  probably 30-50% improved since he left the hospital.  He has had no  other neurologic symptoms.  He denies any symptoms of TIA, amaurosis or  stroke.  He has resumed his Coumadin and also his aspirin.   PHYSICAL EXAMINATION:  Vital signs:  On physical exam today blood  pressure is 110/59 in the right arm, 102/62 in the left arm, pulse is 86  and regular.  Neck:  Shows a well-healed incision.  Upper extremity and  lower extremity motor strength is 5/5 and symmetric.   Overall the patient is doing well.  He still does have some hypoglossal  nerve palsy, although his tongue is fairly midline today his speech was  slightly tongue-tied.  He will follow up with me in 6 weeks' time to  make sure that his symptoms have totally resolved.  We will then  schedule him for 6 month ultrasound of his carotid arteries.   Janetta Hora. Fields, MD  Electronically Signed   CEF/MEDQ  D:  12/30/2008  T:  12/31/2008  Job:  [id number]   cc:   [name], MD, [facility]  Duke Salvia, MD, [facility]  Richard F. Caryn Section, M.D.

## 2011-01-10 NOTE — Assessment & Plan Note (Signed)
OFFICE VISIT   DAK, SZUMSKI  DOB:  Jul 23, 1936                                       12/09/2008  EAVWU#:[id number]   The patient is a 75 year old male who returns for followup today.  We  have been seeing him for an asymptomatic bilateral moderate carotid  stenosis.  He was last seen in December of 2009 at which time he had a  bilateral 60-80% carotid stenosis with the right side being closer to  80%.  He returns today for further followup.  He continues to deny any  symptoms of TIA, amaurosis or stroke.  He states that his congestive  heart failure has been well compensated.  He has had no significant  events in the past 6 months.  He continues to be followed by Dr. Caryn Section for  his renal insufficiency.   PAST MEDICAL HISTORY:  Significant for hypertension, hyperlipidemia,  coronary artery disease, BPH, GERD, hiatal hernia, renal insufficiency,  congestive heart failure.   PAST SURGICAL HISTORY:  Abdominal aortic aneurysm repair, coronary  artery bypass grafting and mitral valve repair with ring annuloplasty.   SOCIAL HISTORY:  He is a former smoker and drinks alcohol occasionally.   MEDICATIONS:  1. Protonix 40 mg three times a week.  2. Calcitriol 0.5 mcg 1 capsule Monday through Friday.  3. Potassium 10 mEq 2 tablets twice a day.  4. Carvedilol 6.25 mg 1 tablet twice a day.  5. Amiodarone 200 mg 1 tablet once a day.  6. Warfarin 2 mg and 3 mg as directed.  7. Hydralazine 25 mg 1-1/2 tablets twice a day.  8. Furosemide 80 mg 1 tablet twice a day.  9. Colace 100 mg 1 tablet twice a day.  10.Crestor 10 mg 1 tablet once a day.  11.Isosorbide 30 mg 1 tablet twice a day.  12.Aspirin 81 mg 1 tablet once a day.  13.Flomax 0.4 mg 1 tablet once a day.  14.NovoLog mix 70/30 fifteen units twice a day.   ALLERGIES:  He has no known drug allergies.   PHYSICAL EXAM:  Blood pressure is 136/86 in the left arm, 129/71 in the  right arm, pulse is 40 and  regular.  HEENT unremarkable.  Neck has 2+  carotid pulses without bruits.  Chest is clear to auscultation.  Cardiac  exam is regular rate and rhythm without murmur.  Abdomen is obese, soft,  nontender, nondistended with well-healed midline laparotomy scar with no  hernia.  Extremities, he has 2+ femoral pulses bilaterally.  Neurologic  exam, he has symmetric upper extremity and lower extremity motor  strength which is 5/5.   He had a repeat carotid duplex exam today which showed the right carotid  stenosis has progressed to greater than 80%.  End-diastolic velocity was  128 cm/second.  Peak systolic velocity was 292 cm/second.  The left  internal carotid artery stenosis is also 60-80%.  Vertebral flow was  antegrade bilaterally.   In summary, the patient's right carotid stenosis has progressed to  greater than 80%.  I believe he would benefit from right carotid  endarterectomy for stroke prophylaxis.  The risks, benefits, possible  complications and procedure details were explained to the patient today  including but not limited to bleeding, infection, stroke risk of 1-2%,  cranial nerve injury risk of 5%, myocardial events 2-5%.  He understands  and agrees to proceed.  This is scheduled for 12/15/2008.  He will stop  his Coumadin on 12/11/2008.  He will continue to take his aspirin.   Janetta Hora. Fields, MD  Electronically Signed   CEF/MEDQ  D:  12/10/2008  T:  12/10/2008  Job:  [id number]   cc:   [name], MD, [facility]  Duke Salvia, MD, [facility]  Richard F. Caryn Section, M.D.

## 2011-01-10 NOTE — Assessment & Plan Note (Signed)
[facility] OFFICE NOTE   JACOBS, GOLAB                      MRN:          [id number]  DATE:05/19/2008                            DOB:          [birth year]    PRIMARY CARE PHYSICIAN:  Franne Forts   REASON FOR PRESENTATION:  Evaluate the patient with coronary disease and  cardiomyopathy.   HISTORY OF PRESENT ILLNESS:  The patient returns for followup.  Since I  last saw him, he saw Dr. [name].  Dr. [name] made some adjustments to his  pacemaker.  Increased his ventricular rate from 60 to 75 beats per  minute.  He also added amiodarone.   The patient says he feels better and that he feels that his heart is  more regular.  He is still quite fatigued which has been his predominant  complaint.  He did get referred to Dr. Shelle Iron for followup of his sleep  apnea and was fitted with a new CPAP machine.  However, he is unable to  wear this one as well.  He is still quite fatigued during the day.  He  thinks his breathing is actually okay.  He is not having any PND or  orthopnea.  He is not having any chest discomfort, neck or arm  discomfort.  He is not having any presyncope or syncope.   PAST MEDICAL HISTORY:  1. Cardiomyopathy (current ejection fraction 25-30%).  2. Coronary artery disease (status post CABG with a LIMA to the LAD,      SVG to circumflex, SVG to posterolateral in June 2006, mitral valve      repair with a #28 Edwards logic ring).  3. Sick sinus syndrome status post pacemaker placement.  4. Frequent premature ventricular contractions.  5. Abdominal aortic aneurysm repair in May 2008.  6. Sleep apnea.  7. Chronic renal insufficiency.  8. Diabetes mellitus.  9. Hypertension.  10.Benign prostatic hypertrophy.  11.Anemia.  12.Deep venous thrombosis, on chronic Coumadin therapy.   ALLERGIES:  SULFA.   MEDICATIONS:  1. Isosorbide 30 mg b.i.d.  2. Crestor 10 mg daily.  3. Coumadin.  4.  Protonix 40 mg 2 times a week.  5. Aspirin 81 mg daily.  6. NovoLog.  7. Potassium 20 mEq daily.  8. Hydralazine 37.5 mg t.i.d. (the patient often forgets the midday      dose).  9. Carvedilol 6.25 mg b.i.d.  10.Stool softener.  11.Rocaltrol.  12.Furosemide 40 mg daily and 40 mg nightly.  13.Amiodarone 200 mg daily.   REVIEW OF SYSTEMS:  As stated in the HPI and otherwise negative for all  other systems.   PHYSICAL EXAMINATION:  GENERAL:  The patient is in no acute distress.  VITAL SIGNS:  Blood pressure 118/76, heart rate 71 and regular, weight  260 pounds.  HEENT:  Eye unremarkable; pupils equal, round, and reactive to light;  fundi not visualized; oral mucosa unremarkable.  NECK:  No jugular venous distention at 45 degrees, carotid upstroke  brisk and symmetric, no bruits, no thyromegaly.  LYMPHATICS:  No cervical, axillary, or inguinal adenopathy.  LUNGS:  Clear to auscultation bilaterally.  BACK:  No costovertebral angle tenderness.  CHEST:  Well-healed sternotomy scar and pacemaker pocket.  HEART:  PMI not displaced or sustained, S1 and S2 within normal limits,  no S3, no S4, no clicks, no rubs, no murmurs.  ABDOMEN:  Obese, positive bowel sounds, normal in frequency and pitch,  no bruits, no rebound, no guarding, no midline pulsatile mass, no  hepatomegaly, no splenomegaly.  SKIN:  No rashes, no nodules.  EXTREMITIES:  Pulse 2+, no edema.  NEURO:  Grossly intact.   1. Cardiomyopathy.  The patient has been quite fatigued when I have      tried to go up on his meds.  He is not having overt dyspnea at this      point.  For now, I am going to leave his meds where they are.  I      will think about titrating his beta blocker further in the future.  2. Fatigue.  This is a predominant complaint.  Again, he has untreated      sleep apnea, and I have encouraged him to follow with Dr.  Shelle Iron      as Dr. Shelle Iron requested to see if he can finally get a CPAP machine      that  works.  It is not clear to me how much of his symptoms may be      heart failure, but again I am holding fast on his medications.  3. Premature ventricular contractions.  He says he is feeling his      heart as more regular.  Interestingly, he was in bigeminy when I      examined him though not on the EKG.  He is going to see Dr. [name]      again in November.  There was some talk of whether he would have a      catheter ablation if he has high burden of premature ventricular      contractions.  He will remain on the amiodarone for now.  I am      going to get liver and thyroid tests added to upcoming labs he is      having drawn for Dr. Caryn Section.  He understands the need for followup of      his lungs and his eyes.  He understands the skin and neuromuscular      complaints.  4. Coronary disease.  He is having no ongoing symptoms.  No further      cardiovascular testing is suggested.  He will continue with risk      reduction.  5. Obesity.  He has gained weight and admits to just eating too much      and not being active.  We talked about this.  6. Followup.  I will see him back in about 3 months which will be      after Dr. [name] has scheduled to see him.     [name], MD, [facility]  Electronically Signed    JH/MedQ  DD: 05/19/2008  DT: 05/19/2008  Job #: [id number]   cc:   Franne Forts

## 2011-01-10 NOTE — Discharge Summary (Signed)
NAMEGERALDINE, Marco [name]               ACCOUNT NO.:  [id number]   MEDICAL RECORD NO.:  [id number]          PATIENT TYPE:  INP   LOCATION:  3307                         FACILITY:  [facility]   PHYSICIAN:  Janetta Hora. Fields, MD  DATE OF BIRTH:  1936-06-27   DATE OF ADMISSION:  12/15/2008  DATE OF DISCHARGE:  12/16/2008                               DISCHARGE SUMMARY   The patient of Dr. Darrick Penna.   FINAL/DISCHARGE DIAGNOSES:  1. Right internal carotid artery stenosis.  2. Hypertension.  3. Hyperlipidemia.  4. Coronary artery disease.  5. Benign prostatic hypertrophy.  6. Gastroesophageal reflux disease.  7. Hiatal hernia.  8. Renal insufficiency.  9. History of congestive heart failure.   PROCEDURES PERFORMED:  Right carotid endarterectomy with a 10-French  shunt and Dacron patch angioplasty closure.   COMPLICATIONS:  None.   CONDITION AT DISCHARGE:  Stable, improving.   DISCHARGE MEDICATIONS:  He is instructed to resume all previous  medications consisting of,  1. Flomax 0.4 mg p.o. daily.  2. NovoLog insulin 70/30, 15 units subcu b.i.d.  3. Protonix 40 mg p.o. 3 times per week.  4. Calcitriol 0.5 mcg p.o. daily Monday through Friday.  5. Potassium 10 mEq 2 tablets p.o. b.i.d.  6. Coreg 6.25 mg p.o. b.i.d.  7. Amiodarone 200 mg p.o. daily.  8. Warfarin 2 mg alternating with 3 mg as directed.  9. Hydralazine 25 mg 1-1/2 tablets p.o. b.i.d.  10.Lasix 80 mg p.o. b.i.d.  11.Colace 100 mg p.o. b.i.d.  12.Crestor 10 mg p.o. daily.  13.Isosorbide 30 mg p.o. b.i.d.  14.Aspirin 81 mg p.o. daily.   DISPOSITION:  He has been discharged home in stable condition with his  wound healing well.  He was given careful instructions regarding the  care of his wounds and his activity levels.  He is given an appointment  with Dr. Darrick Penna in 2 weeks.  The office will arrange the visit.   BRIEF IDENTIFYING STATEMENT:  For complete details, please refer to the  typed history and physical.  Briefly,  this very pleasant 75 year old  gentleman was referred to Dr. Darrick Penna for evaluation of right carotid  arterial stenosis.  Dr. Darrick Penna evaluated him and recommended carotid  endarterectomy for stroke prevention.  He was informed of the risks and  benefits of the procedure and after careful consideration he elected to  proceed with surgery.   HOSPITAL COURSE:  Preoperative workup was completed as an outpatient.  He was brought in through same-day surgery and underwent the  aforementioned right carotid endarterectomy.  For complete details,  please refer to the typed operative report.  The procedure was without  complications.  He was returned to the Post Anesthesia Care Unit  extubated.  Following stabilization, he was transferred to a bed on a  surgical convalescent floor.  He was observed overnight.  The following  day, he was desirous of discharge.  He did have a slight deviation of  his tongue to the right which should resolve over the next several  weeks.  He was neurologically, otherwise, nonfocal.  His wounds were  healing well.  He was discharged home in stable condition following  ambulation and a voiding trial.      [name], PA      Janetta Hora. Fields, MD  Electronically Signed    KEL/MEDQ  D:  12/16/2008  T:  12/17/2008  Job:  [id number]   cc:   Janetta Hora. Darrick Penna, MD

## 2011-01-10 NOTE — Assessment & Plan Note (Signed)
Marco [name] OFFICE NOTE   Marco [name], Marco [name]                      MRN:          [id number]  DATE:09/16/2007                            DOB:          13-Aug-1936    PRIMARY CARE PHYSICIAN:  Marco [name], M.D.   REASON FOR VISIT:  Evaluate patient with coronary disease and  cardiomyopathy.   HISTORY OF PRESENT ILLNESS:  The patient is 75 years old.  He has  history of coronary disease as described below.  Since I last saw him he  has seen Marco [name], M.D. who is following him for nonobstructive  carotid stenosis.  He also saw Dr. Graciela [name].  Because of his ventricular  ectopy, he did have his device reprogrammed to a DVIR mode.  The patient  is doing relatively well.  He is slowly getting some energy back and  actually bought a treadmill.  He will get on that for a couple of  minutes everyday.  He says he is starting to breathe a little bit  better.  He does have a cold today and the last couple of days.  This is  mostly in upper sinuses.  He has not had any fevers or chills.  He is  not having any chest discomfort.  He is not having any PND or orthopnea.  He says his renal function is followed by Marco [name]. Marco [name], M.D. and gets  lab work checked routinely for management of his anemia.   PAST MEDICAL HISTORY:  Cardiomyopathy (EF 25-30%), coronary artery  disease status post CABG (by Dr. Cornelius [name] in June of 2006 with LIMA to LAD,  SVG to circumflex, and SVG to posterolateral), mitral valve repair with  a #28 Edwards Logic Ring, sick sinus syndrome status post pacemaker  placement by Dr. Graciela [name], abdominal aortic aneurysm repaired in May of  2008, sleep apnea (intolerant of CPAP), chronic renal insufficiency,  diabetes mellitus, hypertension, benign prostatic hypertrophy, anemia,  deep venous thrombosis on chronic Coumadin therapy, status post  pacemaker implant with Medtronic EnRhythm.   ALLERGIES:   SHELLFISH.   MEDICATIONS:  1. Isosorbide dinitrate 30 mg b.i.d.  2. Crestor 10 mg daily.  3. Hectorol 2.5 mg a week.  4. Coumadin.  5. Protonix.  6. Aspirin 81 mg daily.  7. NovoLog Insulin.  8. Furosemide 80 mg b.i.d.  9. Potassium 20 mEq daily.  10.Hydralazine 37.5 mg t.i.d.  11.Stool softener.  12.Carvedilol 3.125 mg b.i.d.   REVIEW OF SYSTEMS:  As stated in the HPI and otherwise negative for  other systems.   PHYSICAL EXAMINATION:  GENERAL:  The patient is in no distress.  VITAL SIGNS:  Blood pressure 130/66, heart rate 62 with frequent ectopy,  weight 246 pounds, body mass index 33.5.  HEENT:  Eye lids unremarkable, pupils equal, round, and reactive to  light.  Fundi not visualized.  NECK:  No jugular venous distention to 45 degrees.  Carotid upstroke  brisk and symmetric, no bruits, no thyromegaly.  LYMPHATICS:  No cervical, axillary, or inguinal adenopathy.  LUNGS:  Clear to auscultation bilaterally.  CHEST:  Well-healed sternotomy scar and ICD pocket.  HEART:  PMI not displaced or sustained.  S1 and S2 within normal limits.  No S3, no S4.  No clicks, rubs, or murmurs.  ABDOMEN:  Obese, positive bowel sounds normal in frequency and pitch.  No bruits, no rebound, no guarding, no midline pulsatile mass, no  hepatomegaly, and no splenomegaly.  SKIN:  No rashes, no nodules.  EXTREMITIES:  2+ pulses throughout.  No edema.  NEUROLOGY:  Grossly intact.   ASSESSMENT:  1. Cardiomyopathy.  The patient has had a slow recovery of energy and      still on the road back from his surgery last year.  I am going to      move very slowly on his medications.  I will make no change at this      point other than to suggest that he might tolerate less Lasix.  I      have asked him to check with Dr. Caryn [name] to make sure he is agreeable      with this.  He is going to go to 80 and 40 as tolerated.  2. Coronary disease.  He has had no new symptoms.  Further      cardiovascular testing is  suggested.  3. Renal insufficiency per Dr. Caryn [name].  4. Pacemaker placement per Dr. Graciela [name].     [name], MD, [facility]  Electronically Signed    JH/MedQ  DD: 09/16/2007  DT: 09/16/2007  Job #: [id number]   cc:   Marco [name]

## 2011-01-10 NOTE — Assessment & Plan Note (Signed)
[facility] OFFICE NOTE   Marco [name], Marco [name]                      MRN:          [id number]  DATE:07/22/2007                            DOB:          1935-11-05    PRIMARY:  Dr. [name].   REASON FOR PRESENTATION:  Evaluate patient with coronary disease and  cardiomyopathy.   HISTORY OF PRESENT ILLNESS:  Patient is 75 years old.  He has done  relatively well since I last saw him.  He still has the lack of energy.  What is interesting is that some days he feels well and has energy,  though these are rare.  Other days he is fatigued throughout the day,  though he tries to maintain a certain level of activity.  He has not had  any new symptoms.  He thinks his breathing has slowly improved.  He is  not describing any PND or orthopnea.  He has not been having any chest  discomfort, neck discomfort, arm discomfort; activity induced nausea,  vomiting or excessive diaphoresis.  He has had no palpitation,  presyncope or syncope.   PAST MEDICAL HISTORY:  1. Cardiomyopathy (EF 25% - 30%).  2. Coronary artery disease status post CABG by Dr. [name] (June of 2006      with a LIMA to the LAD, SVG to circumflex and SVG to the      posterolateral).  3. Mitral valve repair with a #28 Edwards Logic ring.  4. Sick sinus syndrome, status post pacemaker placement by Dr. [name].  5. Abdominal aortic aneurysm repair May 2008, sleep apnea (he has been      intolerant of CPAP).  6. Chronic renal insufficiency (followed by Dr. Caryn Section).  7. Diabetes mellitus.  8. Hypertension.  9. Benign prostatic hypertrophy.  10.Anemia.  11.Deep venous thrombosis on chronic Coumadin therapy.  12.Status post ICS implant with Medtronic EnRhythm.   ALLERGIES:  SHELLFISH.   MEDICATIONS:  1. Hydralazine 37.5 mg t.i.d.  2. Isosorbide dinitrate 30 mg b.i.d.  3. Crestor 10 mg daily.  4. Hectorol.  5. Coumadin.  6. Protonix 40 mg three times per  week.  7. Aspirin 81 mg daily.  8. NovoLog insulin.  9. Furosemide 80 mg b.i.d.  10.Stool softener.   REVIEW OF SYSTEMS:  As stated in the HPI and otherwise negative for  other systems.   PHYSICAL EXAMINATION:  Patient is in no distress.  Blood pressure  130/64, heart rate 87 and regular, weight 244 pounds.  HEENT:  Eyelids unremarkable, pupils equally round and reactive to  light, fundi not visualized, oral mucosa unremarkable.  NECK:  No jugular venous distention at 45 degrees, carotid upstroke  brisk and symmetric, no bruits, no thyromegaly.  LYMPHATICS:  No cervical, axillary, inguinal adenopathy.  LUNGS:  Clear to auscultation bilaterally.  BACK:  No costovertebral angle tenderness.  CHEST:  Well-healed sternal scar and ICD pocket.  HEART:  PMI not displaced or sustained, S1 and S2 within normal limits,  no S3, no S4, no clicks, no rubs, no murmurs.  ABDOMEN:  Obese, positive bowel sounds normal  in frequency and pitch, no  bruits, no rebound, no guarding, no midline pulsatile mass, no  organomegaly.  SKIN:  No rashes, no nodules.  EXTREMITIES:  With 2+ pulses, no edema.  NEURO:  Oriented to person, place, and time; cranial nerves II-XII  grossly intact, motor grossly intact.   EKG:  Atrial paced rhythm, premature ventricular contractions, axis  within normal limits, intervals within normal limits, no acute ST-T wave  changes.   ASSESSMENT/PLAN:  1. Cardiomyopathy.  Today I am going to try to add back Carvedilol      3.125 mg b.i.d.  I would like to be able to titrate this medicine      slowly over time.  I think his blood pressure will tolerate this.  2. Coronary disease, having no new symptoms.  No further      cardiovascular testing is suggested.  3. Sleep apnea, I think this is a very significant problem for this      patient, he certainly has fatigue.  He is unable to wear the      continuous positive airway pressure though he has never worked with      anybody to  try a different machine or a different settings.  I have      encouraged him to talk to Dr. Sullivan Lone about potential referral to      sleep medicine specialist (we now have several in our office).  I      think this is contributing significantly to his symptoms.  4. Chronic renal insufficiency, he says there has been some      improvement.  He has this followed by Dr. Caryn Section.  5. Mitral valve repair, we will follow this clinically.  6. Status post implantable cardioverter defibrillator placement, he      will be followed by Dr. [name]      in our [facility].  7. Followup, I will see the patient back in January to see if I can      further titrate his medications.     [name], MD, [facility]  Electronically Signed    JH/MedQ  DD: 07/22/2007  DT: 07/22/2007  Job #: [id number]   cc:   [name]

## 2011-01-10 NOTE — Progress Notes (Signed)
[facility] OFFICE NOTE   TAEDEN, GELLER                      MRN:          [id number]  DATE:07/20/2008                            DOB:          08/26/1936    Mr. Beadnell is seen in followup for fatigue with a cardiomyopathy and  implanted pacemaker, who has significant ongoing problems with fatigue,  exercise intolerance, shortness of breath.  He has a height PVC burden  which with amiodarone has decreased from about 20% to about 14%.  There  has been no significant change in his exercise capacity, although he is  now going to a chiropractor and working on a treadmill 3-4 times a day  walking 2-3 minutes at about 1.3 miles per hour and this has helped him  feel a good deal better.  Unfortunately, his weight has not been  impacted and this is up to 262 pounds which is 7 pounds in 3 months and  10 pounds in 8 months.  His lungs were clear.  His heart sounds were  regular today.  The abdomen was protuberant.  The extremities had no  edema.   His medications in addition to the aforementioned include furosemide 40  b.i.d., carvedilol 6.25 b.i.d., isosorbide 30 b.i.d., Hydralazine 37.5  b.i.d., Protonix, Crestor, Warfarin, Flomax, and NovoLog.   Interrogation of his device was notable as above.   IMPRESSION:  1. Ischemic heart disease.      a.     Status post bypass.      b.     Status post mitral valve repair.      c.     Ejection fraction of 25-30%.  2. Previously implanted pacemaker.  3. Premature ventricular contractions - frequent.  4. Sleep apnea - intolerant of mask.  5. Renal insufficiency.   Mr. Patty continues to be worsened by fatigue.  Initially, he has left  ventricular dysfunction and the concern is that I am sure they are  related closely.  We will need to keep an eye on his LV function.  We  will plan to check an echo again in about 2 months' time.  I will see  him in 2  months or so after that.   At some point, we may also need to consider catheter ablation if his PVC  burden persists and worsens and/or consideration for ICD implantation.  I have encouraged him in his weight loss program.    Duke Salvia, MD, [facility]  Electronically Signed   SCK/MedQ  DD: 07/20/2008  DT: 07/21/2008  Job #: [id number]   cc:   [name]

## 2011-01-10 NOTE — Procedures (Signed)
CAROTID DUPLEX EXAM   INDICATION:  Followup, carotid artery disease.   HISTORY:  Diabetes:  Yes.  Cardiac:  CAD, CABG.  Hypertension:  Yes.  Smoking:  Quit 20 years ago.  Previous Surgery:  No.  CV History:  No.  Amaurosis Fugax No, Paresthesias No, Hemiparesis No.                                       RIGHT             LEFT  Brachial systolic pressure:         138               132  Brachial Doppler waveforms:         Normal            Normal  Vertebral direction of flow:        Antegrade         Antegrade  DUPLEX VELOCITIES (cm/sec)  CCA peak systolic                   86                95  ECA peak systolic                   181               152  ICA peak systolic                   300               204  ICA end diastolic                   84                57  PLAQUE MORPHOLOGY:                  Mixed             Mixed  PLAQUE AMOUNT:                      Moderate/severe   Moderate/severe  PLAQUE LOCATION:                    ICA/ECA           ICA/ECA/CCA   IMPRESSION:  1. 60-79% stenosis of the bilateral proximal to mid internal carotid      arteries.  2. No significant change noted when compared to the previous      examination on 08/14/07.      ___________________________________________  Janetta Hora. Fields, MD   CH/MEDQ  D:  02/26/2008  T:  02/26/2008  Job:  [id number]

## 2011-01-10 NOTE — Progress Notes (Signed)
[facility] OFFICE NOTE   Marco [name], Marco [name]                      MRN:          [id number]  DATE:02/01/2009                            DOB:          10/19/1935    [name] is seen at our request because of review of his histograms  demonstrating a dimorphic population suggestive of ongoing problems with  ventricular ectopy.  We tried to put him on amiodarone for suppression  of his PVCs.  Initially, we were optimistic as we saw reduction from 20%  to 14%, however it is back up to about 17%.   He complains of weakness and sluggishness.   He has LV dysfunction and the question is to what degree is his PVC  burden contributing to this.   Morphology of his PVCs are with a left bundle superior axis - biphasic  in lead II suggestive of an inflow tract focus.   His device was interrogated, but was not reprogrammed though we did not  notice that there were some Ps that were not tracked, but he was in the  DDI mode so as to prevent that.   His medication list is legion and is unchanged apart from the  uptitration of his furosemide accomplished in the last visit from 40-80  b.i.d.  He also takes amiodarone 200, aspirin 81, Protonix 40 __________  Crestor 10, warfarin, hydralazine 37.5 b.i.d., isosorbide 30 b.i.d..   IMPRESSION:  1. Sinus node dysfunction.  2. Status post pacer for the above.  3. Ischemic heart disease with;      a.     Prior bypass.      b.     Status post mitral valve repair.      c.     Previous ejection fraction of 20-30%.  4. Renal insufficiency.   [name] is having this ongoing problem with ventricular ectopy and  the question is to what degree it is contributing to his overall status.  I think I would like to consider catheter ablation for this and the  discontinuation of his amiodarone.  I will plan to review the chart a  little bit more and discuss with colleagues,  the potential value of that  as well as the optimal mapping system.     Duke Salvia, MD, [facility]  Electronically Signed   SCK/MedQ  DD: 02/01/2009  DT: 02/02/2009  Job #: [id number]   cc:   Wilber Bihari. Caryn Section, M.D.  Richard Sullivan Lone

## 2011-01-10 NOTE — Progress Notes (Signed)
[facility]                  [place] [facility]' OFFICE NOTE   HASSELL, PATRAS                      MRN:          [id number]  DATE:07/30/2007                            DOB:          05-Jan-1936    HISTORY:  [name] is seen following pacemaker implantation for sinus  node dysfunction.  He is not feeling very well.  He has been following  up with Dr. Antoine Poche.  His hypotension which has been a problem has been  ameliorated by discontinuation of his Coreg.  He has also been able to  have hydralazine added in combination with nitrates given his renal  insufficiency for his cardiomyopathy.  He is to be started on Procrit.   PHYSICAL EXAMINATION:  VITAL SIGNS:  Blood pressure 142/72, pulse 60.  LUNGS:  Clear.  HEART:  Sounds were regular.  EXTREMITIES:  Without edema.   PROCEDURE:  Interrogation of his Medtronic in rhythm pulse generator  demonstrates P-wave of 3.7, impedance of 480, threshold of 0.5 and 0.4.  The R-wave is 14.1 with impedance of 432 and threshold of 0.5 and 0.4.  Battery voltage is 3.0.   Review of the reel time and stored demonstrates that he has frequent  ventricular ectopy resulting for mediated tachycardia.   I have taken the liberty of re-programming his device to the DVIR mode.  As he has sinus node dysfunction, this will allow for intrinsic  conduction, but will prevent him from tracking his retrograde P-waves.   FOLLOW UP:  1. We will see him again in 1 years' time.  2. He will follow up with Dr. Antoine Poche as previously scheduled.     Duke Salvia, MD, [facility]  Electronically Signed    SCK/MedQ  DD: 07/30/2007  DT: 07/30/2007  Job #: [id number]   cc:   [name]

## 2011-01-10 NOTE — Assessment & Plan Note (Signed)
OFFICE VISIT   Marco [name], Marco [name]  DOB:  [birth year]                                       02/26/2008  BJYNW#:[id number]   The patient returns for followup today.  We have been following him for  moderate bilateral carotid stenosis.  He also previously underwent  abdominal aortic aneurysm repair in May of 2008.  He denies any symptoms  of TIA or amaurosis from his carotid stenosis.  He has underlying renal  dysfunction and states this is stable.  He also is having continued  followup with Dr. [name].  He denies any abdominal or back pain.   MEDICATIONS:  Include insulin 15 units twice a day, Crestor 10 mg once  in the evening, potassium 10 mEq twice a day, amiodarone 200 mg 3  tablets twice a day, furosemide 80 mg once a day, Flonase 2 puffs  nightly, Protonix 40 mg 3 times a week, aspirin 81 mg once a day,  calcitriol 0.5 mcg once a day, carvedilol 6.25 mg twice a day,  hydralazine 37.5 mg 3 times a day, Colace 100 mg twice a day, warfarin 3  mg once a day.   PHYSICAL EXAM:  Today blood pressure 143/72 in the left arm, 150/81 in  the right arm.  HEENT is unremarkable.  He has 2+ carotid pulses without  bruit.  Chest:  Clear to auscultation.  Cardiac exam is regular rate and  without murmur.  Abdomen is obese, soft, nontender, nondistended with a  well-healed midline scar.  No hernia.  He has 2+ femoral pulses  bilaterally.  He has trace edema in both lower extremities, the right  slightly greater than left.  Neurologic exam shows symmetric upper  extremity and lower extremity motor strength which is 5/5.   He had a repeat carotid duplex exam, which again showed bilateral 60-80%  internal carotid artery stenosis.  There is no significant change from  December of 2008.  Overall, the patient looks well.  He is essentially  unchanged from 6 months ago.  His carotid stenosis remains asymptomatic  and in the moderate range.  I believe the best management for this  is  continued risk factor modification and anticoagulation therapy.  He will  follow up with me with a repeat carotid duplex exam in 6 months' time or  sooner if he develops carotid symptoms.   Janetta Hora. Fields, MD  Electronically Signed   CEF/MEDQ  D:  02/26/2008  T:  02/27/2008  Job:  [id number]   cc:   [name], MD, [facility]  Duke Salvia, MD, [facility]  Richard F. Caryn Section, M.D.

## 2011-01-10 NOTE — Assessment & Plan Note (Signed)
OFFICE VISIT   LEAMON, PALAU  DOB:  1935-09-22                                       08/14/2007  CWCBJ#:[id number]   The patient returns for further followup today.  He underwent an  abdominal aortic aneurysm repair in May of 2008.  We have been following  him for moderate carotid stenosis as well.  Overall, he has been doing  well, but stated he was in the hospital a few months ago for drainage of  a right pleural effusion.  He states that his renal function has overall  been stable and may be slightly improved.  He reports no symptoms of  TIA, amaurosis, or stroke.   PHYSICAL EXAM:  Blood pressure is 126/69 in the left arm, 130/75 in the  right arm.  Heart rate is 56 and irregular.  Abdomen has a well-healed  midline incision.  He is obese, soft, nontender.  Nondistended.  No  masses.  He has active bowel sounds.  He has 2+ femoral pulses.  Neck  shows 2+ carotid pulses without bruits.  Chest is clear to auscultation.  Cardiac exam is slightly irregular.  He has 2+ radial pulses  bilaterally.   Neurological exam shows symmetric upper extremity and lower extremity  motor strength.  This is 5/5.   Repeat carotid duplex exam today showed a 60-70% stenosis in his  internal carotid arteries bilaterally, right slightly greater than left.  There was antegrade flow in the vertebral arteries.   As long as the patient continues to remain asymptomatic with his  moderate carotid stenosis, I do not believe intervention is required at  this point.  If the lesion progresses to greater than 80% or  he develops symptoms, we may consider carotid endarterectomy at some  point in the future.  He will return for repeat carotid duplex exam in 6  months' time or sooner if he has symptoms.   Janetta Hora. Fields, MD  Electronically Signed   CEF/MEDQ  D:  08/14/2007  T:  08/15/2007  Job:  [id number]   cc:   [name], MD, [facility]  Duke Salvia, MD, [facility]  Richard F. Caryn Section,  M.D.

## 2011-01-10 NOTE — Discharge Summary (Signed)
Marco [name], Marco [name]               ACCOUNT NO.:  [id number]   MEDICAL RECORD NO.:  [id number]          PATIENT TYPE:  INP   LOCATION:  2040                         FACILITY:  [facility]   PHYSICIAN:  Janetta Hora. Fields, MD  DATE OF BIRTH:  [birth year]   DATE OF ADMISSION:  12/27/2006  DATE OF DISCHARGE:  12/31/2006                               DISCHARGE SUMMARY   ADMITTING DIAGNOSIS:  A 6 cm abdominal aortic aneurysm.   DISCHARGE/SECONDARY DIAGNOSES:  1. A 6 cm abdominal aortic aneurysm status post repair.  2. Cardiomyopathy with ejection fraction of 25 to 30%.  3. Coronary artery disease status post coronary artery bypass graft by      Dr. [name] June of 2006.  4. Mitral valve repair with a #28 mm Edwards [employer].  5. Sick sinus syndrome status post pacemaker placement by Dr. [name].  6. Obstructive sleep apnea.  7. Chronic renal insufficiency followed by Dr. Caryn Section.  8. Diabetes mellitus on insulin.  9. Hypertension.  10.Benign prostatic hypertrophy.  11.Hemorrhoids.  12.Anemia.  13.History of deep vein thrombosis with chronic Coumadin therapy in      the past.  14.Status post ICD implant with a Medtronic EnRhythm.  15.ALLERGY TO SHELLFISH.   PROCEDURES:  Repair of abdominal aortic aneurysm using an 18 mm Dacron  graft.   BRIEF HISTORY:  Marco [name] is a 75 year old Caucasian male who was  referred to Dr. Darrick Penna for abdominal aortic aneurysm.  In August of  2007, the aneurysm measured 5.7 cm.  In followup in March of 2008, CT  showed the aneurysm had enlarged to 6.5 cm.  Based on the size of the  aneurysm, Dr. Darrick Penna felt that he should undergo repair, however he was  high risk due to his comorbidities as listed above.  His anatomy and  renal function makes him an unsuitable candidate for endovascular  repair.  Thus, Dr. Darrick Penna felt open repair would be the best treatment  option, however the patient would need to be seen by his cardiologist  and nephrologist prior to scheduling  in order to feel that he was  appropriate candidate.  He was seen in followup on December 14, 2006.  He  was seen by Dr. Antoine Poche and Dr. Caryn Section prior and his creatinine was stable  between 2.5 and 3.5.  His congestive heart failure had significantly  improved and; therefore, it was felt appropriate for him to proceed with  his aneurysm repair.  His anticoagulation therapy was stopped on December 23, 2006 in anticipation for surgery.  Marco [name] remained asymptomatic  of his aneurysm.   HOSPITAL COURSE:  Marco [name] was electively admitted to [facility] on Dec 27, 2006 and underwent open abdominal aortic aneurysm  repair.  Postoperatively, he was transferred to the surgical intensive  care unit.  He remained stable overnight and was extubated  neurologically intact.  Creatinine remained stable at 2.4 and he was  restarted on his home regimen of furosemide 100 mg twice daily.  His  abdomen appeared soft and nasogastric tube was discontinued.  By  postoperative day two,  he was passing some flatus and diet was advanced  to clear liquids.  He was also felt appropriate to transfer to unit 2000  where he remained until discharge.  On postoperative day three, we began  to advance his diet to regular diet and by postoperative day four, he  was tolerating a regular diet, he was mobilizing without difficulty and  voiding following Foley catheter removal.  His renal insufficiency  remained stable with a creatinine of 2.76 and BUN of 47 with his  baseline creatinine and BUN of 2.92 and 49, respectively.  His sodium  was 140, potassium 3.6, chloride 106, CO2 29, blood glucose 150, white  blood count 5.3, hemoglobin 10.3, hematocrit 31.3, platelet count 181.  Postoperative liver function tests were normal other than a low alkaline  phosphatase at 28.  His total protein was 5.0 and blood albumin 2.3.  Vitals were also stable.  Blood pressure 127/73.  He was afebrile.  Heart rate was in a paced rhythm  with a rate of around 60 up to around  100.  His sugars were primarily below 200 and he was instructed to start  his NovoLog 70/30 insulin at home and to monitor his blood sugars  closely and notify his family doctor if there are high or low  fluctuations.  His incision appeared to be healing well without sign of  infection and his feet appeared well perfused.  On Dec 31, 2006, he was  felt appropriate to be discharged home.   DISCHARGE MEDICATIONS:  1. Furosemide 100 mg p.o. b.i.d.  2. Coreg 15.625 mg p.o. b.i.d.  3. Hydralazine 25 mg p.o. t.i.d.  4. Isosorbide dinitrate 30 mg p.o. b.i.d.  5. Colrite 100 mg p.o. daily.  6. Crestor 10 mg p.o. daily.  7. Hectorol 2.5 mg p.o. three times weekly.  8. Warfarin 2 mg daily and as directed.  9. Protonix 40 mg p.o. three times weekly.  10.Aspirin 81 mg p.o. daily.  11.NovoLog insulin 70/30 15 units subcutaneously b.i.d.  12.Percocet 5/325 one to two tablets every four hours p.r.n. pain.   DISCHARGE INSTRUCTIONS:  He is to resume his preoperative diet, may  increase his activities slowly.  He is encouraged to continue daily  walking and breathing exercises.  He is to avoid heavy lifting or  driving for the next three weeks.  He may shower and clean his incision  gently with soap and water and should call if he develops fever greater  than 101, redness or drainage from incision sites or nausea or vomiting.   FOLLOWUP:  1. He is to follow up with Dr. Darrick Penna at the [facility] office in      approximately three weeks and should have a staple      removal appointment on Jan 10, 2007.  Our office will contact him      regarding specific dates and times.  2. He is to see Dr. Antoine Poche on May 13 at 10:00 a.m. and was asked to      have his kidney function rechecked at that appointment.  He was      also asked to have his PT/INR rechecked with [facility] within one      week.      Jerold Coombe, P.A.     Janetta Hora. Fields, MD  Electronically  Signed    AWZ/MEDQ  D:  12/31/2006  T:  12/31/2006  Job:  [id number]   cc:   Wilber Bihari. Caryn Section, M.D.  [name],  MD, [facility]

## 2011-01-10 NOTE — Op Note (Signed)
Marco [name], Marco [name]               ACCOUNT NO.:  [id number]   MEDICAL RECORD NO.:  [id number]          PATIENT TYPE:  INP   LOCATION:  4708                         FACILITY:  [facility]   PHYSICIAN:  Hillis Range, MD       DATE OF BIRTH:  November 22, 1935   DATE OF PROCEDURE:  03/19/2009  DATE OF DISCHARGE:                               OPERATIVE REPORT   PREPROCEDURE DIAGNOSES:  1. Premature ventricular contractions.  2. Nonsustained ventricular tachycardia.   POSTPROCEDURE DIAGNOSES:  1. Premature ventricular contractions.  2. Nonsustained ventricular tachycardia.   PROCEDURES:  1. Comprehensive electrophysiologic study.  2. Coronary sinus pacing recording.  3. A 3-D mapping of ventricular tachycardia.  4. Radiofrequency ablation of ventricular tachycardia.  5. Arterial blood pressure monitoring.   INTRODUCTION:  Marco [name] is a pleasant 75 year old gentleman with a  history of chronic lung disease, ischemic cardiomyopathy, sick sinus  syndrome, status post pacemaker implantation, and chronic renal  insufficiency, who presents for EP study and radiofrequency ablation of  symptomatic premature ventricular contractions and nonsustained  ventricular contractions and nonsustained ventricular tachycardia.  He  has previously failed medical therapy with Coreg, started with beta-  blockers and amiodarone.  He has symptoms of fatigue.  He reports having  a prior abdominal aneurysm repair 2 years ago.  He now presents for EP  study and radiofrequency ablation.   DESCRIPTION OF PROCEDURE:  Informed written consent was obtained, and  the patient was brought to the electrophysiology lab in a fasting state.  He was adequately sedated with intravenous Versed and fentanyl as  outlined in the nursing report.  The patient's right groin was prepped  and draped in the usual sterile fashion by the EP lab staff.  Using a  percutaneous Seldinger technique, 16, 17, and 18-French hemostasis  sheaths were  placed in his right common femoral vein.  An 8-French  hemostasis sheath was placed in the right common femoral artery for  blood pressure monitoring.  A 6-French decapolar bidirectional coronary  sinus Biosense-Webster catheter was advanced through the right common  femoral vein and advanced into the coronary sinus for recording and  pacing from this location.  A 6-French quadripolar Josephson catheter  was introduced through the right common femoral vein and advanced into  the right ventricle recording and pacing.  An additional 6-French  hexapolar Josephson catheter was introduced through the right common  femoral vein and advanced into the His bundle location.  The patient  presented to the electrophysiology lab in an atrial paced rhythm with  PVCs in a bigeminal pattern.  His pacemaker was reprogrammed AAI at 50  beats per minute.  He was therefore noted to have an atrial paced rhythm  with an RR interval of 1200 msec.  The PR duration was 201 msec with a  QRS duration 115 msec and a QT interval 510 msec.  The AH interval  measured 88 msec with an HV interval of 45 msec.  The patient was noted  to have frequent premature ventricular contractions of a left bundle-  branch slightly inferior axis with a QRS duration  of 192 msec.  The QRS  was noted to have notching in the down slope in lead V1 with transition  to a positive QRS and V2.  Leads II and VF were positive, however, III  was negative, aVR was negative and aVL was strongly positive.  A 7-  [place] Biosense-Webster EZ Steer 4-mm ablation catheter was introduced  through the right common femoral vein and advanced into the right  ventricle.  Three-dimensional electroanatomical mapping was performed  using CARTO technology.  The entire right ventricular outflow tract was  mapped and was found to be very late.  I therefore mapped the remainder  of the right ventricle.  The patient was clearly found to have a septal  prominence and the  lateral wall of the right ventricle was very late in  relation to this.  The earliest ventricular activation preceded the  surface QRS during the PVC by 28 msec.  This was found in the distal  third of the right ventricle, two thirds from the base and approximately  one third from the inferior portion of the right ventricle along the  septum.  Pace mapping was performed in this location, which revealed a  relatively poor pace-map match.  The tachycardia was felt to likely lie  within the septum and likely from the left ventricular side.  I  therefore attempted to perform 3-D mapping of the left ventricle.  The  ablation catheter was removed from the body and introduced into the  right common femoral artery.  The catheter was advanced up into the  level of the distal aorta just at the bifurcation of the iliac arteries.  The catheter could not be advanced any further than this.  A Wholey wire  was therefore introduced through the right common femoral artery and  advanced to the same level.  This wire could not be advanced any further  either.  I did not perform an angiogram as the patient's creatinine is  known to be chronically elevated and he also has a SHELLFISH allergy.  Given the patient's prior abdominal aortic aneurysm repair, I elected to  not perform aggressive attempts at mapping of the left ventricle.  Three-  dimensional mapping of the left ventricle was therefore not performed  today.  The ablation catheter was removed from the body and re-advanced  into the right ventricle through the right common femoral vein.  Additional three-dimensional electroanatomical mapping was performed  within the right ventricle.  The aforementioned earliest site was  confirmed.  A series of 3 radiofrequency applications were delivered in  this location at a target temperature of 60 degrees at 50 W for 60  seconds each.  The patient was noted to have frequent bigeminal PVCs  during ablation and  following ablation, had only rare PVCs.  It was felt  that the PVC focus may lie deep within the septum and possibly from the  left ventricular side.  As his PVC frequency had significantly reduced,  the procedure was therefore considered completed.  All catheters were  removed and the sheaths were aspirated and flushed.  The sheaths were  removed and hemostasis was assured.  Following ablation, the patient's  pacemaker was re-interrogated and programmed in the MVP pacing mode  (AAIR/DDDR) with a low rate limit of 60 beats per minute.  He was  observed without further increases in his rare PVC burden.  There were  no early apparent complications.   CONCLUSIONS:  1. Atrial paced rhythm with frequent premature  ventricular      contractions upon presentation.  2. Significant reduction in the premature ventricular contraction      burden with radiofrequency current applied to the right ventricular      septum.  3. No early apparent complications.      Hillis Range, MD  Electronically Signed     JA/MEDQ  D:  03/19/2009  T:  03/20/2009  Job:  [id number]   cc:   Duke Salvia, MD, [facility]  Richard Sherryle Lis, MD, [facility]

## 2011-01-10 NOTE — Assessment & Plan Note (Signed)
[facility] OFFICE NOTE   Marco [name], Marco [name]                      MRN:          [id number]  DATE:01/08/2007                            DOB:          Dec 28, 1935    The primary is [name], MD.   REASON FOR PRESENTATION:  Evaluate patient with recent abdominal aortic  aneurysm repair.   HISTORY OF PRESENT ILLNESS:  The patient is a very pleasant 75 year old  gentleman who was discharged on May 5 following repair of an abdominal  aortic aneurysm by Dr. Darrick Penna.  He did relatively well with this.  He  has been quite sore since going home.  However, he has been able to get  up around the house.  He has been eating and having bowel movements but  he has not been having any fevers or chills.  From a cardiovascular  standpoint, he did well.  He did not have chest discomfort, neck or arm  discomfort.  He had no shortness of breath.  He has not had any PND or  orthopnea.  He has not had any palpitations, no syncope.  He has been  somewhat lightheaded and his blood pressure has been low.   PAST MEDICAL HISTORY:  1. Cardiomyopathy (EF 25-30%).  2. Coronary artery disease, status post CABG by Dr. [name] (June 2006)      with a LIMA to the LAD, SVG to circumflex, SVG to posterolateral.  3. Mitral valve repair with a #28 Edwards Logix ring.  4. Sick sinus syndrome, status post pacemaker placement by Dr. [name].  5. Abdominal aortic aneurysm repair.  6. Sleep apnea.  7. Chronic renal insufficiency.  8. Diabetes mellitus.  9. Hypertension.  10.Benign prostatic hypertrophy.  11.Hemorrhoids.  12.Anemia.  13.Deep venous thrombosis, on chronic Coumadin therapy.  14.Status post ICD implant with a Medtronic EnRhythm.   ALLERGIES:  SHELLFISH.   MEDICATIONS:  1. Furosemide 100 mg b.i.d.  2. Carvedilol 15.625 mg b.i.d.  3. Hydralazine 25 mg t.i.d.  4. Isosorbide 30 mg b.i.d.  5. __________  100 mg daily.  6. Crestor  10 mg daily.  7. Hectorol 2.5 mcg three times per week.  8. Coumadin.  9. Protonix.  10.Aspirin 81 mg daily.  11.NovoLog.   REVIEW OF SYSTEMS:  As stated in the HPI and otherwise negative for  other systems.   PHYSICAL EXAMINATION:  GENERAL:  The patient is in no distress.  VITAL SIGNS:  Blood pressure 82/48, heart rate 60 and regular, weight  248 pounds.  HEENT:  Eyelids unremarkable.  Pupils equal, round, and reactive to  light.  Fundi not visualized.  Oral mucosa unremarkable.  NECK:  No jugular venous distention, waveform within normal limits,  carotid upstroke brisk and symmetric.  No bruits or thyromegaly.  LYMPHATIC:  No cervical, axillary or inguinal adenopathy.  LUNGS:  Clear to auscultation bilaterally.  BACK:  No costovertebral angle tenderness.  CHEST:  A well-healed sternotomy scar, well-healed ICD pocket.  HEART:  PMI not displaced or sustained, S1 and S2 within normal limits.  No S3, no S4.  No  clicks, no rubs, no murmurs.  ABDOMEN:  Obese, surgical scar with intact staples, no erythema or  drainage.  No rebound, guarding, hepatomegaly, splenomegaly, no midline  pulsatile mass, no bruits.  SKIN:  No rashes, no nodules.  EXTREMITIES:  2+ pulses.  No cyanosis, no clubbing, no edema.  NEUROLOGIC:  Oriented to person, place and time.  Cranial nerves II-XII  are grossly intact, motor grossly intact.   EKG:  Sinus rhythm, rate 63, premature ventricular contractions, axis  within normal limits, nonspecific T-wave changes.   ASSESSMENT AND PLAN:  1. Coronary disease.  The patient is doing well, as expected.  He has      had no chest pain.  No further cardiovascular testing is suggested.  2. Cardiomyopathy.  I am actually going to back off on his Coreg to      12.5 mg b.i.d.  I am also going to reduce his Lasix to 80 mg b.i.d.      He is hypotensive and slightly lightheaded.  His wife will let me      know if he has any increasing dyspnea or swelling with this.  We       will titrate his medications further as we are able.  Currently he      has class II symptoms.  3. Obesity.  Slowly we will work on losing weight with diet and      exercise.  4. Chronic Coumadin therapy.  I will go ahead and order a PT today.      He will also get other labs including a CBC and BMET.  5. Follow-up.  I will see the patient back in about 2 months or sooner      if needed.    [name], MD, [facility]    JH/MedQ  DD: 01/08/2007  DT: 01/08/2007  Job #: [id number]   cc:   [name]

## 2011-01-10 NOTE — Procedures (Signed)
CAROTID DUPLEX EXAM   INDICATION:  Followup evaluation of known carotid artery disease.   HISTORY:  Diabetes:  Yes.  Cardiac:  Coronary artery disease, coronary artery bypass graft,  arrhythmia.  Hypertension:  Yes.  Smoking:  Former smoker.  Previous Surgery:  No.  CV History:  Previous duplex on 08/26/2008 revealed 60-79% right ICA  stenosis and 60-79% left ICA stenosis, however, the patient had  arrhythmia throughout that procedure.  Amaurosis Fugax No, Paresthesias No, Hemiparesis No                                       RIGHT             LEFT  Brachial systolic pressure:         126               132  Brachial Doppler waveforms:         Triphasic         Triphasic  Vertebral direction of flow:        Antegrade         Antegrade  DUPLEX VELOCITIES (cm/sec)  CCA peak systolic                   62                67  ECA peak systolic                   140               161  ICA peak systolic                   292               199  ICA end diastolic                   128               104  PLAQUE MORPHOLOGY:                  Mixed             Mixed  PLAQUE AMOUNT:                      Moderate          Moderate to severe  PLAQUE LOCATION:                    Proximal to mid ICA                 Proximal to mid ICA      IMPRESSION:  1. Velocity suggests 80-99% right ICA stenosis, however, the patient      was in arrhythmia throughout the duplex.  The distal end of the      right ICA plaque is approximately 3.5 cm distal to the bifurcation      of the common carotid artery.  2. 60-79% left ICA stenosis.   ___________________________________________  Janetta Hora Fields, MD   MC/MEDQ  D:  12/09/2008  T:  12/09/2008  Job:  [id number]

## 2011-01-10 NOTE — Assessment & Plan Note (Signed)
OFFICE VISIT   BREION, NOVACEK  DOB:  February 21, 1936                                       01/25/2007  LKGMW#:[id number]   The patient returns for follow-up today after repair of his abdominal  aortic aneurysm on Dec 27, 2006.  He had an uneventful postoperative  recovery.  He states that he is slowly getting his strength and energy  back.  His appetite is also returning.  He is asking to lift some  weights.   On examination today blood pressure is 98/55, heart rate is 72 and  regular.  There is no obvious evidence of hernia on his abnormal  incision which is healing well.  He has 2+ femoral pulses bilaterally.  Feet are adequately perfused.  He has some mild left lower quadrant pain  which he said that he had intermittently prior to his aneurysm repair.   Overall the patient is doing well.  I informed him that it is okay to  drive and return to his normal activities.  I told him no lifting  heavier than 5 pounds until he is 6 weeks postop.  He will follow-up  with me in November 2008 and have a repeat carotid duplex at that time.  He has some moderate carotid stenosis.   Janetta Hora. Fields, MD  Electronically Signed   CEF/MEDQ  D:  01/25/2007  T:  01/25/2007  Job:  [id number]   cc:   [name], MD, [facility]  [place]

## 2011-01-10 NOTE — Assessment & Plan Note (Signed)
OFFICE VISIT   LEOPOLDO, MAZZIE  DOB:  1936/07/22                                       02/03/2009  ZOXWR#:[id number]   The patient returns for followup today after right carotid  endarterectomy on April 20.  He had some mild cranial nerve XII palsy.  He returns today for further followup.   On exam today blood pressure is 111/72 in the left arm, 128/82 in the  right arm.  Pulse is 90 and irregular.  The neck incision is now well-  healed.  He has essentially no tongue deviation whatsoever at this  point.  The tongue-tied speech that he had previously has essentially  resolved.  He has no other neurologic findings.  He continues to take  aspirin and Coumadin.  He will have a repeat carotid duplex exam in  October of 2010.   Janetta Hora. Fields, MD  Electronically Signed   CEF/MEDQ  D:  02/03/2009  T:  02/04/2009  Job:  [id number]   cc:   Wilber Bihari. Caryn Section, M.D.  [name], MD, [facility]  Duke Salvia, MD, [facility]

## 2011-01-10 NOTE — Procedures (Signed)
CAROTID DUPLEX EXAM   INDICATION:  Followup, known carotid artery disease.   HISTORY:  Diabetes:  Yes.  Cardiac:  Coronary artery disease, CABG.  Hypertension:  Yes.  Smoking:  Quit 20 years ago.  Previous Surgery:  No.  CV History:  Amaurosis Fugax No, Paresthesias No, Hemiparesis No                                       RIGHT             LEFT  Brachial systolic pressure:         150               130  Brachial Doppler waveforms:         Biphasic          Biphasic  Vertebral direction of flow:        Antegrade         Antegrade  DUPLEX VELOCITIES (cm/sec)  CCA peak systolic                   95                114  ECA peak systolic                   209               235  ICA peak systolic                   319 (mid)         288  ICA end diastolic                   80                88  PLAQUE MORPHOLOGY:                  Heterogenous      Heterogenous  PLAQUE AMOUNT:                      Moderate-to-severe                  Moderate-to-severe  PLAQUE LOCATION:                    ICA, ECA          ICA, ECA   IMPRESSION:  1. 60-79% stenosis noted in bilateral internal carotid arteries, the      right is greater than left.  2. Antegrade bilateral vertebral arteries.   ___________________________________________  Janetta Hora Fields, MD   MG/MEDQ  D:  08/14/2007  T:  08/15/2007  Job:  [id number]

## 2011-01-10 NOTE — Procedures (Signed)
CAROTID DUPLEX EXAM   INDICATION:  Followup of carotid disease   HISTORY:  Diabetes:  yes  Cardiac:  CAD, CABG, arrhythmia  Hypertension:  yes  Smoking:  Previous  Previous Surgery:  Right CEA 12/15/2008 performed by Dr. Darrick Penna  CV History:  Asymptomatic  Amaurosis Fugax No, Paresthesias No, Hemiparesis No                                       RIGHT             LEFT  Brachial systolic pressure:         130               130  Brachial Doppler waveforms:         WNL               WNL  Vertebral direction of flow:        Antegrade         Antegrade  DUPLEX VELOCITIES (cm/sec)  CCA peak systolic                   52                87  ECA peak systolic                   105               164  ICA peak systolic                   153 (mid) at end point              257 (prox- mid)  ICA end diastolic                   58                111  PLAQUE MORPHOLOGY:                  Calcified         calcified  PLAQUE AMOUNT:                      mild              Moderate  PLAQUE LOCATION:                    End point CEA     ICA / ECA   IMPRESSION:  1. Elevated velocity suggesting 40% to 59% stenosis of right CEA end      point.  2. A 60% to 79% stenosis of left internal carotid artery, tortuosity      observed and may account for velocity increase.  3. Antegrade vertebral arteries bilaterally.  4. Stable findings compared to study of 12/08/2009.   ___________________________________________    Janetta Hora. Darrick Penna, M.D.  LT/MEDQ  D:  07/14/2010  T:  07/14/2010  Job:  [id number]

## 2011-01-10 NOTE — Assessment & Plan Note (Signed)
[facility] OFFICE NOTE   Marco [name], Marco [name]                      MRN:          [id number]  DATE:02/11/2007                            DOB:          Jul 24, 1936    PRIMARY CARE PHYSICIAN:  [name], M.D.   REASON FOR VISIT:  To evaluate patient with chest pain.   HISTORY OF PRESENT ILLNESS:  The patient presents for evaluation of  right-sided chest discomfort.  I saw him last month reducing his Coreg  and his Lasix as he was hypotensive following abdominal aortic aneurysm  repair.  We did followup his creatinine and he had baseline renal  insufficiency which was unchanged from previous.   He is having no new shortness of breath.  He has some chronic dyspnea  with moderate exertion (Class II symptoms).  He is not having any  resting shortness of breath, denies any PND or orthopnea, however, he  has been getting some right-sided discomfort.  This has been going on  for months.  This was happening before his aneurysm.  He describes a  sharp discomfort under his right rib cage.  It happens sporadically.  It  may not happen for weeks or months at a time.  When it does come on, it  may last off and on for a week.  He notices it when he moves in a  certain direction.  Typically, when he stands up straight, he does not  have the discomfort.  It does not happen with breathing.  He does not  have any associated symptoms such as nausea, vomiting or diaphoresis.  He does not have any increased shortness of breath, cough or fevers.  He  does not have any chills, although he is constantly cold with his  Coumadin.  He has not had this discomfort in several days.   PAST MEDICAL HISTORY:  1. Cardiomyopathy (EF 25-30%).  2. Coronary artery disease, status post CABG by Dr. [name] (June 2006,      with the LIMA to the LAD, SVG to circumflex, SVG to      posterolateral).  3. Mitral valve repair with a #28 Edwards logic  ring.  4. Sick sinus syndrome, status post pacemaker placement by Dr. [name].  5. Abdominal aortic aneurysm repair in May 2008.  6. Sleep apnea.  7. Chronic renal insufficiency.  8. Diabetes mellitus.  9. Hypertension.  10.Benign prostatic hypertrophy.  11.Anemia.  12.Deep venous thrombosis on chronic Coumadin therapy.  13.Status post ICD implant with a Medtronic EnRhythm.   ALLERGIES:  SHELLFISH.   MEDICATIONS:  1. Furosemide 80 mg b.i.d.  2. Coreg 12.5 mg b.i.d.  3. Hydralazine 25 mg t.i.d.  4. Isosorbide 30 mg b.i.d.  5. Colrite.  6. Crestor.  7. Hectorol.  8. Coumadin.  9. Protonix 40 mg three times per week.  10.Aspirin 81 mg daily.  11.NovoLog 15 units b.i.d.   REVIEW OF SYSTEMS:  As stated in the HPI and otherwise negative for  other systems.   PHYSICAL EXAMINATION:  GENERAL:  The patient is in no acute distress.  VITAL  SIGNS:  Blood pressure 90/58, heart rate 62 and regular.  HEENT:  Eyes unremarkable.  Pupils equal round and reactive to light.  Fundi not visualized.  Oral mucosa remarkable.  NECK:  No JVD of 45 degrees.  Carotid upstrokes brisk and symmetric.  No  bruits and no thyromegaly.  LYMPHATICS:  No cervical, axillary or inguinal adenopathy.  LUNGS:  Clear to auscultation bilaterally.  BACK:  No costovertebral angle tenderness.  CHEST:  Well-healed sternotomy scar and ICD pocket.  HEART:  PMI not displaced or sustained, S1, S2 within normal limits.  No  S3, S4.  No clicks, no rubs, no murmurs.  ABDOMEN:  Well-healed surgical scar, positive bowel sounds.  Normal in  frequency and pitch, no bruits, no guarding.  No midline pulsatile  masses or organomegaly.  SKIN:  No rashes.  No nodules.  EXTREMITIES:  2+ pulses throughout.  No edema.  No clubbing, cyanosis or  edema.  NEUROLOGIC:  Oriented to person, place and time.  Cranial nerves 2-12  grossly intact.  Motor grossly intact.   LABORATORY DATA AND X-RAY FINDINGS:  EKG with sinus atrial paced  rhythm,  borderline low voltage in the limb leads, poor anterior R-wave  progression, inferolateral T wave inversions unchanged from previous and  nonspecific.   ASSESSMENT/PLAN:  1. The patient's chest discomfort is quite atypical for cardiac      etiology.  It is a relatively chronic pattern, sporadic.  It is      most consistent with musculoskeletal.  His physical exam and EKG do      not suggest an acute process.  At this point, no further      cardiovascular testing is suggested.  2. Cardiomyopathy.  The patient seems to be tolerating the medications      currently.  His blood pressure is still running on the low side.  I      will continue the current dosing.  3. Renal insufficiency.  He is due to see Dr. Caryn Section in a few days.  His      creatinine has been stable at about 3.0.  4. Chronic Coumadin therapy.  The patient remains on this for deep      venous thrombosis.  He      is followed at the Coumadin clinic and will make sure he has an      appointment.  5. I will see him back in August.     [name], MD, [facility]  Electronically Signed    JH/MedQ  DD: 02/11/2007  DT: 02/11/2007  Job #: [id number]   cc:   [name]

## 2011-01-10 NOTE — Progress Notes (Signed)
[facility]                  [facility] OFFICE NOTE   AUSTON, HALFMANN                      MRN:          [id number]  DATE:02/03/2008                            DOB:          [birth year]    HISTORY OF PRESENT ILLNESS:  Mr. Sesay is seen following pacemaker  implantation 3 years ago for sick sinus syndrome in the setting of  previous bypass surgery and mitral valve repair.  At that time, his  ejection fraction was 45-50%.  He has unfortunately had intercurrent  worsening of his ejection fraction so that Dr. Jenene Slicker note on Jan 07, 2008, records an ejection fraction of 25-30%.  The last echo I see  is April 2008 with an ejection fraction of 30-35%.   He has frequent ventricular ectopy, which may be progressive.  His major  complaint is fatigue.  He has poor exercise tolerance.  He walks on his  treadmill 3-6 minutes a day on good days.   He has sleep apnea, but has not been able to tolerate CPAP, although he  is working on it.   His past medical history is notable for DVT, on chronic Coumadin  therapy.   MEDICATIONS:  1. Isosorbide 30 b.i.d.  2. Crestor 10.  3. Warfarin.  4. Protonix.  5. Aspirin 81.  6. NovoLog 70/30.  7. Furosemide 60 b.i.d.  8. Hydralazine 37.5 t.i.d.  9. Carvedilol 6.25 b.i.d.  10.Rocaltrol.  11.Furosemide.   PHYSICAL EXAMINATION:  On examination, his blood pressure today is  135/72, his pulse was 64, and his weight was 255, which was stable.  NECK:  His neck veins were flat.  His carotids were brisk.  LUNGS:  Clear.  HEART:  His heart sounds were irregular.  ABDOMEN:  Protuberant, but soft.  EXTREMITIES:  Edema 1+.   Electrocardiogram was not obtained, but the one from March 19  demonstrated sinus rhythm with bigeminy and interrogation of his  pacemaker demonstrated 27 PVCs an hour consistent with bigeminy.  His  device was otherwise functioning normally and demonstrated that he was  active about 6/10 of an hour per day.   IMPRESSION:  1. Sick sinus syndrome, status post pacemaker 3 years ago with      ejection fraction of 45-50%.  2. Intercurrent left ventricular systolic deterioration with ejection      fraction of 25-30%.  3. Ischemic heart disease.      a.     Status post coronary artery bypass graft.      b.     Status post mitral valve repair.  4. Frequent premature ventricular contraction, question contributing      to cardiomyopathy.  5. Abdominal aortic aneurysm.  6. Renal insufficiency with creatinine of 2.6 as of September 2008.  7. Fatigue and lassitude - congestive heart failure - class III.  8. Obstructive sleep apnea, intolerant to continuous positive airway      pressure.   Mr. Arzuaga is not thriving.  I have reprogrammed his device from 60 to  75 beats per minute, and this resulted in a change from bigeminy to  trigeminy.  We will see how this  helps.  In addition, I have given a  prescription for amiodarone 400 b.i.d. for 2 weeks' time followed by 200  b.i.d. for 2 weeks.  We will see him again in 4 weeks' time, reassess  his PVC count and then decrease his dose to 200 mg a day, and I will see  him again 4 weeks thereafter.  The hope would be to see if we can  decrease his burden of ventricular ectopy and to assess then whether  that translates into improved exercise capacity.  These beats have a  morphology of a left bundle, but a rapid transition from V1-V2 with  superior axis suggesting a right ventricular septal inferior origin.   Catheter ablation is another alternative potentially.   We will see him again as noted above.     Duke Salvia, MD, [facility]  Electronically Signed    SCK/MedQ  DD: 02/03/2008  DT: 02/04/2008  Job #: [id number]

## 2011-01-10 NOTE — Assessment & Plan Note (Signed)
[facility] OFFICE NOTE   Marco, [name]                      MRN:          [id number]  DATE:11/14/2007                            DOB:          1936/06/21    PRIMARY:  Dr. [name].   REASON FOR PRESENTATION:  Evaluate the patient coronary disease and  cardiomyopathy.   HISTORY OF PRESENT ILLNESS:  The patient is 75 years old.  Has a history  of coronary disease as described below.  Since I last saw him he has  been doing relatively well.  He has had still continued fatigue but he  started to get on his treadmill a little bit more.  He has not been  having any presyncope or syncope.  Has not been having any chest pain.  Has no overt shortness of breath, PND or orthopnea.  He has had his dose  of diuretic reduced and he has done well with this symptomatically  without any increased swelling.   PAST HISTORY:  Cardiomyopathy (EF of 25-30%), coronary artery disease  status post CABG (LIMA to the LAD, SVG to circumflex, SVG to  posterolateral June 2006), mitral valve repair with a #28 Adalberto Ill), sick sinus syndrome status post pacemaker placed by Dr. Clide Cliff,  PVCs (his device has been reprogrammed BV IR by Dr. Clide Cliff), abdominal  aortic aneurysm repair May 2008, sleep apnea (intolerance see Pap),  chronic renal insufficiency, diabetes mass, hypertension, benign  prostatic heard, anemia, deep venous thrombosis on chronic Coumadin  therapy.   ALLERGIES:  SHELLFISH.   MEDICATIONS:  Isosorbide 30 mg b.i.d., Crestor 10 mg daily, Hectorol,  Coumadin, Protonix, aspirin 81 mg daily, NovoLog 70/30 15 units b.i.d.,  furosemide 80 mg b.i.d., potassium 12 grams daily, hydralazine 37.5 mg  t.i.d., stool softener, carvedilol 3.125 mg b.i.d.   REVIEW OF SYSTEMS:  As stated in the HPI, otherwise have other systems.   PHYSICAL EXAMINATION:  The patient is in no distress.  Blood pressure 128/58, heart  rate 52 and regular, weight 248 pounds.  NECK:  No jugular distention 45 degrees, carotid upstroke brisk and  symmetric, no bruits, thyromegaly.  Lymphatics no cervical, axillary or inguinal adenopathy.  LUNGS:  Clear to auscultation bilaterally.  BACK:  No costovertebral angle tenderness.  CHEST:  Well-healed sternotomy scar and IC pocket.  HEART:  PMI not displaced or sustained, S1-S2 within normal.  No S3-S4.  No clicks, rubs, murmurs.  ABDOMEN:  Obese, positive bowel sounds normal in frequency and pitch, no  bruits, rebound, guarding or midline pulsatile mass, organomegaly.  SKIN:  No rashes, no nodules.  EXTREMITIES:  2+ pulse, no edema.  NEURO:  Grossly intact.   ASSESSMENT/PLAN:  1. Cardiomyopathy.  The patient has had a very slow recovery since his      aneurysm repair.  At this point I think he will tolerate 6.25 twice      a day of the carvedilol.  He will remain on the other medicines as      listed.  2. Coronary disease, having no new symptoms.  No  further      cardiovascular testing is suggested.  He will continue with risk      reduction.  3. Renal insufficiency.  This followed by Dr. Caryn Section he is going to see      him back in about 3 months.  4. Followup.  I will see him back in about 2 months to see if we could      make another med titration.     [name], MD, [facility]  Electronically Signed    JH/MedQ  DD: 11/14/2007  DT: 11/14/2007  Job #: [id number]   cc:   [name]

## 2011-01-10 NOTE — Procedures (Signed)
CAROTID DUPLEX EXAM   INDICATION:  Follow-up evaluation of known carotid artery disease.   HISTORY:  Diabetes:  Yes.  Cardiac:  Coronary artery disease.  Coronary artery bypass graft.  Hypertension:  Yes.  Smoking:  Quit over 20 years ago.  Previous Surgery:  No.  CV History:  Previous duplex on 02/26/2008 revealed 60-79% ICA stenosis  bilaterally.  Amaurosis Fugax No, Paresthesias No, Hemiparesis No.                                       RIGHT             LEFT  Brachial systolic pressure:         110               120  Brachial Doppler waveforms:         Triphasic         Triphasic  Vertebral direction of flow:        Antegrade         Antegrade  DUPLEX VELOCITIES (cm/sec)  CCA peak systolic                   75                55  ECA peak systolic                   138               171  ICA peak systolic                   281               199  ICA end diastolic                   109               69  PLAQUE MORPHOLOGY:                  Mixed             Mixed  PLAQUE AMOUNT:                      Severe            Severe  PLAQUE LOCATION:                    Proximal ICA      Proximal ICA   IMPRESSION:  1. This was a difficult examination secondary to cardiac arrhythmias.  2. Velocities are stable compared to previous study.  3. 60-79% right internal carotid artery stenosis (high end of range).  4. 60-79% left internal carotid artery stenosis.   Dr. Darrick Penna was notified of preliminary results, and a follow-up  appointment was scheduled for 4 months from now.   ___________________________________________  Janetta Hora. Fields, MD   MC/MEDQ  D:  08/26/2008  T:  08/26/2008  Job:  [id number]

## 2011-01-10 NOTE — Op Note (Signed)
NAMERAGNAR, WAAS               ACCOUNT NO.:  [id number]   MEDICAL RECORD NO.:  [id number]          PATIENT TYPE:  INP   LOCATION:  3307                         FACILITY:  [facility]   PHYSICIAN:  Janetta Hora. Fields, MD  DATE OF BIRTH:  Apr 20, 1936   DATE OF PROCEDURE:  12/15/2008  DATE OF DISCHARGE:  12/16/2008                               OPERATIVE REPORT   PROCEDURE:  Right carotid endarterectomy.   PREOPERATIVE DIAGNOSIS:  Right internal carotid artery stenosis.   POSTOPERATIVE DIAGNOSIS:  Right internal carotid artery stenosis.   ANESTHESIA:  General.   SURGEON:  Charles E. Fields, MD   ASSISTANT:  [name], PA-C   OPERATIVE FINDINGS:  1. Greater than 80% right internal carotid artery stenosis with      extension of plaque approximately 3.5 cm into the right internal      carotid artery.  2. Dacron patch.  3. A 10-French shunt.   OPERATIVE DETAILS:  After obtaining informed consent, the patient was  taken to the operating room.  The patient was placed in supine position  on the operating table.  After induction of general anesthesia and  endotracheal intubation, a Foley catheter was placed.  Next, the  patient's entire right neck and chest were prepped and draped in the  usual sterile fashion.  A longitudinal and oblique incision was made on  the right aspect of the neck just anterior to the border of the  sternocleidomastoid muscle.  Incision was carried down through the  subcutaneous tissues and through the platysma.  The right  sternocleidomastoid muscle was reflected laterally.  The common facial  vein and internal jugular vein were identified.  The common facial vein  was dissected free circumferentially, ligated and divided between the  silk ties.  Dissection was then carried down at the level of the common  carotid artery.  This was dissected free circumferentially.  This was  slightly thickened, but did not seem to have significant stenosis within  it.  A  umbilical tape was placed around this.  Dissection was then  carried at the level of the carotid bifurcation.  The external carotid  and superior thyroid arteries were dissected free circumferentially and  vessel loops were placed around these.  Dissection was then carried up  onto the internal carotid artery.  Preoperative duplex imaging had  suggested that the internal carotid artery stenosis was significantly  out into the internal carotid artery.  This was true on palpation.  Although there was a plaque at the carotid bifurcation, this plaque  extended posteriorly, significantly up into the internal carotid artery  and this required extensive mobilization of the internal carotid artery  and above the bifurcation.  The hypoglossal nerve was mobilized  significantly and there was a fair amount of upper retraction on this to  get above the level of the plaque.  It also required division of a  portion of the posterior belly of the digastric muscle in order to reach  a suitable segment of the internal carotid artery that was disease free.  The ansa cervicalis was also divided  from the hypoglossal nerve.  At  this point, the patient was given 7000 units of intravenous heparin.  The right internal carotid artery was controlled distally with fine  bulldog clamp.  The external carotid and superior thyroid arteries were  controlled with vessel loops.  The common carotid artery was controlled  with a peripheral DeBakey clamp.  Longitudinal opening was made at the  level of the carotid bifurcation and extended up into the internal  carotid artery.  There was good pulsatile backbleeding from the internal  carotid artery.  A 10-French long shunt was brought up in the operative  field and threaded into the distal internal carotid artery allowed to  backbleed thoroughly.  This was then threaded down into the common  carotid artery and secured with a Rumel tourniquet.  Flow was restored  to the brain  after approximately 7 minutes of ischemia time.  Next,  endarterectomy was begun in a suitable plane near the carotid  bifurcation.  This was extended up into the internal carotid artery and  a good distal endpoint was obtained.  External carotid artery was  endarterectomized by eversion technique.  The plaque extended  approximately 3 cm into the external carotid artery as well.  All loose  debris was then removed from the carotid bed.  A Dacron patch was then  brought up in the operative field and sewn on as a patch angioplasty  using a running 6-0 Prolene suture.  Just prior to completion of the  anastomosis, the shunt was reoccluded with a hemostat.  This was then  brought down out of the internal carotid artery and the internal carotid  was controlled with a fine bulldog clamp.  There was excellent  backbleeding from the internal carotid artery.  Shunt was then removed  from the common carotid artery.  This was allowed to forebled  thoroughly.  This was then reconfirmed with a peripheral DeBakey clamp.  The external carotid was thoroughly backbled and then re-secured with a  vessel loop.  The artery was thoroughly irrigated with heparinized  saline.  Again reinspected and make sure all loose debris had been  removed.  The remainder of the patch was then completed.  Flow was then  first restored to the external carotid artery after approximately 5  cardiac cycles to the internal carotid artery.  Doppler was then used to  evaluate the carotid artery and there was good flow in the internal,  external, and common carotid arteries.  Hemostasis was obtained.  Platysma muscle was reapproximated using running 3-0 Vicryl suture.  The  skin was closed with 4-0 Vicryl subcuticular stitch.  The patient  tolerate the procedure well and there were no complications.  Instrument, sponge and needle counts were correct at the end of the  case.  The patient was moving upper extremities and lower  extremities  symmetrically at the end of the case.  It should be noted also that a 10  flat Jackson-Pratt drain was brought up through a separate stab incision  at the base of the neck and secured to the skin with nylon stitch prior  to wound closure.      Janetta Hora. Fields, MD  Electronically Signed     CEF/MEDQ  D:  12/22/2008  T:  12/23/2008  Job:  [id number]

## 2011-01-10 NOTE — Discharge Summary (Signed)
NAMEMACKY, GALIK               ACCOUNT NO.:  [id number]   MEDICAL RECORD NO.:  [id number]          PATIENT TYPE:  INP   LOCATION:  4708                         FACILITY:  [facility]   PHYSICIAN:  Hillis Range, MD       DATE OF BIRTH:  1935-10-23   DATE OF ADMISSION:  03/19/2009  DATE OF DISCHARGE:  03/20/2009                               DISCHARGE SUMMARY   ALLERGIES:  This patient has allergies to SULFA and SHELLFISH.   FINAL DIAGNOSES:  1. PVCs, nonsustained VT   SECONDARY DIAGNOSES:  1. Coronary artery disease - ischemic cardiomyopathy, ejection      fraction 40% at echocardiogram on November 10, 2008.      a.     The patient had stent to the right coronary artery in 1999.       The patient had unstable angina.      b.     Non-Q-wave myocardial infarction in June 2006.      c.     Coronary artery bypass graft surgery on February 01, 2005, with       mitral valve annuloplasty with placement of a 28-mm Edwards       ETlogix ring.  2. Arrhythmia:      a.     Sick sinus syndrome/sinus node dysfunction after coronary       artery bypass graft/mitral valve replacement.      b.     Medtronic EnRhythm dual-chamber pacemaker implanted on February 09, 2005.  3. Extracranial cerebrovascular arterial occlusive disease.      a.     Status post right carotid endarterectomy on December 15, 2008.  4. Aortoiliac occlusive disease.      a.     Status post resection and grafting of abdominal aortic       aneurysm (6 cm) May 2008.  5. Hypertension.  6. Diabetes.  7. Dyslipidemia.  8. Benign prostatic hypertrophy.  9. Gastroesophageal reflux disease/hiatal hernia.  10.Chronic renal insufficiency.  11.New York Heart Association class II-III chronic systolic congestive      heart failure.  12.Obesity.  13.History of deep venous thrombosis/chronic Coumadin.  14.Resection of skin cancer in 2002 and 2006.   PROCEDURE:  March 19, 2009, comprehensive electrophysiology study with  coronary sinus pacing  and recording, 3-D mapping of VT, the PVCs were  left bundle branch inferior axis.  The right ventricle was mapped.  The  earliest activation arising from the septum of the right ventricle.  The  LV could not be mapped due to inability to traverse the distal aorta,  status post AA repair.   Radiofrequency delivered in the right ventricle with significant  reduction of PVC frequency   BRIEF HISTORY:  Mr. Dufresne is a 75 year old man.  He has chronic lung  disease.  He has coronary artery disease.  He has had coronary artery  bypass graft surgery.  He has ischemic cardiomyopathy with decreased  ejection fraction.  He has a pacemaker implanted for sick sinus syndrome  and he has known chronic renal insufficiency.  He  is on Coumadin for  deep venous thrombosis in the past.   The patient has been evaluated over a long period of time by Dr. [name].  He was noted to have significant PVC burden which is symptomatic.  He  has fatigue, palpitations, and worsening short of breath.  He was placed  on amiodarone without significant improvement in the PVC burden.  He  reports significant weakness and no energy.  He is unaware of any  triggers or precipitants for his PVCs.   The patient has been referred to Dr. Hillis Range for consideration of  ablation of VT.  The risks and benefits of this procedure have been  described to the patient and he wishes to proceed.   HOSPITAL COURSE:  The patient presents electively on March 19, 2009.  He  underwent radiofrequency catheter ablation of right ventricular septal  origin of PVCs as described above.  The patient was observed overnight  without early apparhent complications.  He had no further PVCs while  observed on telemetry.   DISCHARGE MEDICATIONS:  He goes home on the following medications:  1. Crestor 10 mg daily at bedtime.  2. Potassium chloride 10 mEq 2 tablets in the morning and 2 tablets in      the evening.  3. Furosemide 80 mg 1 tablet twice  daily.  4. Flonase 50 mcg 2 puffs each nostril at bedtime.  5. Protonix 1 tablet by mouth 3 times a week.  6. Enteric-coated aspirin 81 mg daily.  7. Calcitriol 0.5 mcg 1 tablet daily Monday through Friday.  8. Carvedilol 6.25 mg 1 p.o. b.i.d.  9. Hydralazine 25 mg tablets 1-1/2 tablets in the morning and 1-1/2      tablets in the evening.  10.Docusate sodium 100 mg 1 p.o. b.i.d.  11.Coumadin 3 mg taken as directed by the [facility].  12.Flomax 0.4 mg 1 p.o. nightly.  13.NovoLog solution for injection as directed.  14.Isosorbide dinitrate 30 mg 1 p.o. b.i.d.   The patient is asked to stop amiodarone.   Labs pertinent to this admission were drawn on March 12, 2009, sodium  143, potassium 5, chloride 101, carbonate 35, glucose 132, BUN is 41,  creatinine 2.7.  White cells 5.9, hemoglobin 12.3, hematocrit 38.3, and  platelets of 186.  Protime 24.3 drawn and INR 2.4.  The creatinine at 2.7 shows that the  patient has this as baseline.  We will get a basic metabolic panel on  post-procedure day #1 here at [facility].  Once again, the  patient will follow up with Dr. Johney Frame in 4 weeks.  This appointment has  been called into the office.      Maple Mirza, [place]      Hillis Range, MD  Electronically Signed    GM/MEDQ  D:  03/19/2009  T:  03/20/2009  Job:  [id number]   cc:   [name], MD, [facility]  Richard Ricki Miller. Caryn Section, M.D.

## 2011-01-10 NOTE — Procedures (Signed)
CAROTID DUPLEX EXAM   INDICATION:  Carotid artery disease.   HISTORY:  Diabetes:  Yes.  Cardiac:  CAD, CABG, arrhythmia.  Hypertension:  Yes.  Smoking:  Previous.  Previous Surgery:  Right CEA on 12/15/2008 by Dr. Darrick Penna.  CV History:  Asymptomatic.  Amaurosis Fugax No, Paresthesias No, Hemiparesis No.                                       RIGHT             LEFT  Brachial systolic pressure:         148               152  Brachial Doppler waveforms:         WNL               WNL  Vertebral direction of flow:        Antegrade         Antegrade  DUPLEX VELOCITIES (cm/sec)  CCA peak systolic                   69                94  ECA peak systolic                   93                170  ICA peak systolic                   133               214  ICA end diastolic                   42                62  PLAQUE MORPHOLOGY:                  Calcified         Calcified  PLAQUE AMOUNT:                      Mild              Moderate  PLAQUE LOCATION:                    CCA               ICA/ECA/CCA   IMPRESSION:  1. Right internal carotid artery velocities are suggestive of 40% to      59% stenosis distal to the patch with no internal carotid artery      plaque visualized.  2. Left internal carotid artery velocities are suggestive of 60% to      79% stenosis.  3. Left internal carotid artery is tortuous.  4. Left external carotid artery stenosis.  5. No significant changes from previous study.   ___________________________________________  Janetta Hora Fields, MD   AS/MEDQ  D:  12/08/2009  T:  12/08/2009  Job:  [id number]

## 2011-01-10 NOTE — Assessment & Plan Note (Signed)
Marco [name] OFFICE NOTE   Marco [name], Marco [name]                      MRN:          [id number]  DATE:05/24/2007                            DOB:          02-14-1936    PRIMARY CARE PHYSICIAN:  Dr. [name].   REASON FOR PRESENTATION:  Evaluate patient with coronary artery disease  and cardiomyopathy.   HISTORY OF PRESENT ILLNESS:  The patient is 75 years old. He presents  for followup of the above. He says that he actually is starting to  finally get some energy back. He says for the first time in a long time  he has more good days then bad days. He is less fatigued. He is still  getting dyspneic with mild exertion. However, he is able to walk to get  the newspaper. He is able to walk in the house. He is not having any  resting shortness of breath. He has not having any PND or orthopnea. He  denies any chest discomfort, neck, or arm discomfort. He has not been  having any palpitations, pre syncope, or syncope. He has been getting IV  iron infusions by Dr. Caryn Section. He said his renal insufficiency has been  stable.   PAST MEDICAL HISTORY:  Cardiomyopathy (ejection fraction 25% to 30%),  coronary artery disease status post CABG by Dr. [name] (June of 2006 with  a LIMA to the LAD, SVG to circumflex, and SVG to posterolateral), mitral  valve repair with a #28 Edward's Logic Ring, sick sinus syndrome status  post pacemaker placement by Dr. [name], abdominal aortic aneurysm repair  May 2008, sleep apnea, chronic renal insufficiency (followed by Dr.  Marina Gravel), diabetes mellitus, hypertension, benign prostatic  hypertrophy, amenia, deep venous thrombosis on chronic Coumadin therapy,  status post ICD implant with Medtronic EnRhythm.   ALLERGIES:  SHELL FISH.   MEDICATIONS:  1. Hydralazine 25 mg t.i.d.  2. Isosorbide 30 mg b.i.d.  3. Col-Rite.  4. Crestor 10 mg daily.  5. Hectorol.  6. Coumadin.  7. Protonix 40  mg 3 times a week.  8. Aspirin 81 mg daily.  9. NovoLog.  10.Furosemide 80 mg b.i.d.  11.Potassium 20 mEq.   REVIEW OF SYSTEMS:  As stated in the HPI and otherwise negative for  other systems.   PHYSICAL EXAMINATION:  Patient is in no distress. Blood pressure 138/68,  heart rate 78 and regular, weight 246 pounds.  HEENT: Eyelids unremarkable, pupils equal, round, and reactive to light.  Fundi not visualized. Oral mucosa unremarkable.  NECK: No jugular venous distension at 45 degrees. Carotid upstroke brisk  and symmetric. No bruits or thyromegaly.  LYMPHATICS: No adenopathy.  LUNGS: Clear to auscultation bilaterally.  BACK: No costovertebral angle tenderness.  CHEST: Well-healed sternotomy scar and ICD pocket.  HEART: PMI not displaced or sustained. S1 and S2 within normal limits.  No S3. No S4, clicks, rubs, or murmurs.  ABDOMEN: Well-healed surgical scar, obese, positive bowel sounds normal  to frequency and pitch. No bruits, rebound, or guarding. No midline  pulsatile mass. No organomegaly.  SKIN: No rashes. No  nodules.  EXTREMITIES: 2+ pulses. Mild ankle edema. No cyanosis, clubbing.  NEURO: Grossly intact.   ASSESSMENT/PLAN:  1. Cardiomyopathy, I am going to leave the patient off beta blockers      still as he is just now getting his energy back and he has had the      recent problems with hypotension. Now his blood pressure has twice      been above 130s. He has a significant cardiomyopathy. I am going to      gingerly go up on his meds by increasing his hydralazine to 37.5 mg      t.i.d. We will move slowly with his meds as he gains his energy      back. Eventually I will try to restart the beta blockers for all of      the significant benefits that this class affords.  2. Coronary disease, we will manage this in the context of treating      his ischemic cardiomyopathy.  3. He has actually gained weight. He has been eating more. He      understands that he needs to  watch this very carefully.  4. Follow up, I will see the patient back in 2 months or sooner if      needed.     [name], MD, [facility]  Electronically Signed    JH/MedQ  DD: 05/24/2007  DT: 05/24/2007  Job #: [id number]   cc:   Mort Sawyers. Caryn Section, M.D.

## 2011-01-10 NOTE — Op Note (Signed)
Marco [name], Marco [name]               ACCOUNT NO.:  [id number]   MEDICAL RECORD NO.:  [id number]          PATIENT TYPE:  INP   LOCATION:  2550                         FACILITY:  [facility]   PHYSICIAN:  Janetta Hora. Fields, MD  DATE OF BIRTH:  [birth year]   DATE OF PROCEDURE:  12/27/2006  DATE OF DISCHARGE:                               OPERATIVE REPORT   PROCEDURE:  Repair of abdominal aortic aneurysm.   PREOPERATIVE DIAGNOSIS:  6 cm abdominal aortic aneurysm.   POSTOPERATIVE DIAGNOSIS:  6 cm abdominal aortic aneurysm.   ANESTHESIA:  General.   ASSISTANT:  Jerold Coombe, P.A.-C.   OPERATIVE FINDINGS:  1. A 6 cm infrarenal abdominal aortic aneurysm.  2. An 18 mm Dacron graft.   OPERATIVE DETAILS:  After obtaining informed consent, the patient was  taken to the operating room.  The patient was placed in the supine  position on the operating table.  After induction of general anesthesia  and endotracheal elevation, the patient's entire abdomen and upper  thighs were prepped and draped in the usual sterile fashion.  A Foley  catheter was placed.  Next, midline laparotomy incision was made from  the xiphoid down to the pubis.  Incision was extended down through the  subcutaneous tissues down to the level of the fascia.  The fascia was  opened for the full length of the incision.  The peritoneal cavity was  entered.  On visual inspection, the liver appeared normal.  The  remainder of the viscera on gross inspection also appeared normal.  Next, the transverse colon was reflected superiorly.  The small bowel  was reflected to the patient's right.  An Omni retractor was then  brought up in the operative field for assistance in retraction.  Retroperitoneum was entered.  This was vascularized with a large number  of veins.  The inferior mesenteric vein was ligated.  The  retroperitoneal fat was also approximately 1-2 cm thick over the aorta.  Retroperitoneum was opened for the full length  of the aortic aneurysm.  A suitable neck was obtained infrarenally.  The left and right renal  arteries were identified and protected.  Next, the incision was carried  down to the level of the aortic bifurcation.  The left and right common  iliac arteries were dissected free circumferentially and vessel loops  placed around these.  The patient was then given 7000 units of  intravenous heparin.  A longitudinal arteriotomy was made in the  aneurysm.  There is contents removed.  Several lumbar arteries were  ligated with silk sutures.  The proximal neck was prepared for grafting  and dissected free circumferentially.  A felt strip was then brought up  in the operative field.  An 18 mm Dacron graft was then sewn end-to-end  to the aorta with a circumferential felt strip.  At the completion of  the anastomosis, this was tested and found be hemostatic.  The graft was  then pulled taut to length.  The graft was then sewn end-to-end to the  aortic bifurcation using a running 3-0 Prolene suture.  At the  completion of the anastomosis, everything was forward bled, backbled,  and thoroughly flushed.  The anastomosis was secured. Clamps were  released.  Flow was first restored to the right leg.  There was a  transient drop in pressure, and quickly recovered.  Flow was then  restored to the left leg. Also, again there was a transient drop in  pressure which quickly recovered.  At this point, there was noted to be  some bleeding on the posterior wall of the distal anastomosis.  This was  repaired with several pledgeted Prolene sutures.  After hemostasis was  obtained, the aneurysm sac was reapproximated using a running 3-0  Prolene suture.  Retroperitoneal fat was then closed over this using a  running 3-0 Prolene suture.  Omni retractor was then removed.  The  patient had pink, warm feet at this point.  The patient was then given  50 mg of protamine.  The patient had been given 7000 units of heparin   prior to clamping.  The viscera were returned to their normal position.  Fascia was reapproximated using a #1 PDS running suture.  The wound was  thoroughly irrigated with normal saline solution, then the skin closed  with staples.  The patient tolerated the procedure well,  and there were  no complications.  Instrument, sponge, and needle count was correct at  the end of the case.  The patient was taken to the recovery room in  stable condition.      Janetta Hora. Fields, MD  Electronically Signed     CEF/MEDQ  D:  12/27/2006  T:  12/27/2006  Job:  [id number]

## 2011-01-10 NOTE — Assessment & Plan Note (Signed)
[facility]                            CARDIOLOGY OFFICE NOTE   BENN, TARVER                      MRN:          [id number]  DATE:08/24/2008                            DOB:          10/01/1935    PRIMARY CARE PHYSICIAN:  Dr. [name].   REASON FOR PRESENTATION:  Evaluate the patient with coronary artery  disease and cardiomyopathy.   HISTORY OF PRESENT ILLNESS:  The patient is now pleasant 75 year old  gentleman who presents for followup of the above.  Since I last saw him,  he did see Dr. [name].  He has quite a bit of ventricular ectopy, but it  seems to be improved with amiodarone.  Dr. [name] is still considering  the possibility of VT ablation.  He is also discussing with the patient  the possibility of a defibrillator.  The patient continues to have  fatigue as a predominant complaint.  He says he is tired throughout the  day.  He gets very little exercise.  He is able to do only a couple of  minutes on the treadmill a couple times a day.  He is not describing any  new shortness of breath.  He is not having any PND or orthopnea.  His  blood pressure has been stable.  He has not had any lightheadedness,  presyncope, or syncope.  He denies any chest discomfort, neck or arm  discomfort.  Unfortunately, the patient did not tolerate CPAP in the  past, so he tried a couple of different masks.   PAST MEDICAL HISTORY:  Cardiomyopathy with an ejection fraction of  approximately 25-30%, coronary artery disease (status post CABG with a  LIMA to the LAD, SVG to circumflex, SVG to posterolateral June 2006.),  mitral valve repair with a #28 Edwards logic ring, sick sinus syndrome  status post pacemaker placement, frequent premature ventricular  contractions, abdominal aortic aneurysm status post repair May 2008,  sleep apnea, chronic renal insufficiency, diabetes mellitus,  hypertension, benign prostatic hypertrophy, anemia, deep venous  thrombosis on chronic Coumadin therapy.   ALLERGIES:  SULFA.   MEDICATIONS:  1. NovoLog.  2. Hydralazine 37.5 mg b.i.d.  3. Coreg 6.25 mg b.i.d.  4. Furosemide 40 mg b.i.d.  5. Flomax 0.4 mg daily.  6. Calcitriol 0.5 mcg daily.  7. Amiodarone 200 mg daily.  8. Docusate 100 mg b.i.d.  9. Potassium 10 mEq b.i.d.  10.Aspirin 81 mg daily.  11.Protonix 40 mg 3 times a week.  12.Coumadin.  13.Crestor 10 mg daily.  14.Isosorbide 30 mg b.i.d.   REVIEW OF SYSTEMS:  As stated in the HPI and otherwise negative for  other systems.   PHYSICAL EXAMINATION:  GENERAL:  The patient is in no distress.  VITAL SIGNS:  Blood pressure 124/88, heart rate 79 and regular, weight  171 pounds, body mass index 36.  HEENT:  Eyelids are unremarkable.  Pupils equal, round, and reactive to  light.  Fundi not visualized.  Oral mucosa unremarkable.  NECK:  No jugular venous distention at 45 degrees.  Carotid upstroke  brisk and symmetric, no bruits, no thyromegaly.  LYMPHATICS:  No adenopathy.  LUNGS:  Clear to auscultation bilaterally.  BACK:  No costovertebral angle tenderness.  CHEST:  Well-healed sternotomy scar and pacemaker pocket.  HEART:  PMI not displaced or sustained, S1 and S2 within normal limits.  No S3, no S4, no clicks, no rubs, no murmurs.  ABDOMEN:  Obese, positive bowel sounds normal in frequency and pitch, no  bruits, no rebound, no guarding, no midline pulsatile mass, no  organomegaly.  SKIN:  No rashes, no nodules.  EXTREMITIES:  Pulses 2+,  no edema.  NEURO:  Grossly intact.   EKG sinus rhythm, ventricular pacing, frequent ventricular ectopy.   ASSESSMENT AND PLAN:  1. Cardiomyopathy.  The patient has been sensitive to meds with      significant hypotension and fatigue.  At this point, I am going to      leave him on the drugs as listed.  I will try to titrate his meds      in the future if I think we have window of opportunity.  2. Premature ventricular contractions.  The  patient has frequent      ventricular ectopy, though less so since starting amiodarone.  I      will defer to Dr. [name] for further management of this.  I would      consider the patient, however, for a defibrillator in the months.  3. Fatigue.  I had discussion today with Dr. Craige Cotta to understand      whether we are referring any of our patients for surgery if they      cannot tolerate CPAP.  He does not suggested as a particularly      viable option.  The patient has tried hard to wear CPAP and cannot.      Therefore, we need to concentrate on weight loss for management of      this.  4. Obesity.  He and his wife are starting a diet today.  5. Followup.  I will see the patient again in about 3 months or sooner      if needed.     [name], MD, [facility]  Electronically Signed    JH/MedQ  DD: 08/24/2008  DT: 08/25/2008  Job #: [id number]   cc:   [name]

## 2011-01-10 NOTE — Assessment & Plan Note (Signed)
[facility] OFFICE NOTE   Marco [name], Marco [name]                      MRN:          [id number]  DATE:01/07/2008                            DOB:          [birth year]    PRIMARY:  Dr. [name].   RECENT FOR PRESENTATION:  Evaluate patient with coronary disease and  cardiomyopathy.   HISTORY OF PRESENT ILLNESS:  The patient presents for follow-up of the  above.  He is 75 years old.  He has had no new complaints since I last  saw him.  His biggest issue is fatigue.  He says he does not sleep well  at night.  He does have sleep apnea and cannot tolerate the CPAP.  He  does not have any new shortness of breath.  He is not describing any PND  or orthopnea.  He has not noticed any palpitations and has not had any  presyncope or syncope.   PAST MEDICAL HISTORY:  Cardiomyopathy (EF 25-30%), coronary artery  disease (status post CABG with a LIMA to the LAD, SVG to circumflex, SVG  to posterolateral in June 2006), mitral valve repair with a #28 Edwards  logic ring, sick sinus syndrome, status post pacemaker placement by Dr.  [name], frequent premature ventricular contractions (his device was  reprogrammed to DDIR by Dr. [name]), abdominal aortic aneurysm repair in  May 2008, sleep apnea, chronically renal insufficiency, diabetes  mellitus, hypertension, benign prostatic hypertrophy, anemia, deep  venous thrombosis on chronic Coumadin therapy.   ALLERGIES:  SHELLFISH.   MEDICATIONS:  1. Isosorbide 30 mg b.i.d.  2. Crestor 10 mg daily.  3. Hectorol.  4. Coumadin.  5. Protonix.  6. Aspirin 81 mg daily.  7. NovoLog.  8. Furosemide 60 mg b.i.d.  9. Potassium.  10.Hydralazine 37.5 mg t.i.d.  11.Stool softener.  12.Carvedilol 6.25 mg b.i.d..   REVIEW OF SYSTEMS:  As stated in the HPI, and otherwise negative other  systems.   PHYSICAL EXAMINATION:  GENERAL:  The patient is in no distress.  VITAL SIGNS:  Blood  pressure 112/60, heart rate 60 and regular.  Weight  259 pounds.  HEENT:  Eyes unremarkable, pupils equal, round, reactive.  Fundi not  visualized.  Oral mucosa normal.  NECK:  No jugulovenous distention at 45 degrees.  Carotid upstroke brisk  and symmetric.  Right carotid bruit.  No thyromegaly.  LYMPHATICS:  No cervical, axillary or inguinal adenopathy.  LUNGS:  Clear to auscultation bilaterally.  BACK:  No costovertebral angle tenderness.  CHEST:  Well-healed sternotomy scar in an ICD pocket.  HEART:  PMI not displaced or sustained.  S1-S2 within normal limits.  No  S3-S4.  No clicks, rubs or murmurs.  ABDOMEN:  Obese, positive bowel sounds.  Normal in frequency and pitch.  No bruits, no rebound, no guarding, no midline pulsatile mass, no  hepatomegaly, no splenomegaly.  SKIN:  No rashes, no nodules.  EXTREMITIES:  2+ pulses, no edema.  NEURO:  Grossly intact.   ASSESSMENT:  1. .Cardiomyopathy.  He did tolerate the titration of his Coreg at the  last visit.  However, I do not think his blood pressure or heart      rate will allow me to go up further.  Therefore, he will remain on      the medications as listed.  He does seem to be euvolemic.  2. Coronary disease, having no symptoms.  No further cardiovascular      testing is suggested.  He will continue with risk reduction.  3. Renal insufficiency.  He sees Dr. Caryn Section in early June.  4. Status post pacemaker placement.  He sees Dr. [name] at [facility] in June, he believes.  5. Sleep apnea.  I think this is causing the patient's fatigue.  He      says he cannot wear the mask that he has.  I will suggest to Dr.      Sullivan Lone consideration of sending him to a sleep specialist to see      if there is a mask that he could tolerate or whether there are any      surgical considerations for his sleep apnea.  I am not sure if      there are any sleep certified physicians in [place].  Certainly,      they could  be found in [place] or [place].  The [place] group now      has four certified sleep physicians.   FOLLOW-UP:  I will see him back in about 4 months or sooner if needed.     [name], MD, [facility]  Electronically Signed    JH/MedQ  DD: 01/07/2008  DT: 01/07/2008  Job #: [id number]   cc:   [name]

## 2011-01-10 NOTE — Progress Notes (Signed)
[facility] OFFICE NOTE   SHON, INDELICATO                      MRN:          [id number]  DATE:10/26/2008                            DOB:          1936-05-09    Mr. Seidner is seen in followup for cardiomyopathy and implanted  pacemaker, significant fatigue, and shortness of breath, which  unfortunately is abated by him getting a good night sleep but he remains  unable to tolerate CPAP despite repeated attempts with Dr. Shelle Iron.  He  has lost 10 pounds recently and this has had some improvement on his  exercise capacity.  He saw Dr. Caryn Section a couple of weeks ago.   CURRENT MEDICATIONS:  1. Potassium 10 b.i.d.  2. Furosemide 40 b.i.d.  3. Docusate.  4. Carvedilol 6.25 b.i.d.  5. Isosorbide.  6. Amiodarone 200.  7. Hydralazine 37.5 b.i.d.  8. Aspirin.  9. Protonix.  10.Warfarin.   PHYSICAL EXAMINATION:  VITAL SIGNS:  His blood pressure today was  132/74, his pulse was 42, his weight was 253 pounds.  NECK:  His neck veins were 7-8 cm.  His carotids were brisk.  LUNGS:  Clear.  HEART:  Sounds were regular.  ABDOMEN:  Protuberant.  EXTREMITIES:  He had 2+ peripheral edema.  NEUROLOGIC:  Grossly normal.   Interrogation of his Medtronic pacemaker demonstrates a P-wave of 3 with  impedance of 520, threshold 1 volt at 0.4, the R-waves 12.9 with  impedance of 432 with threshold 0.5 at 0.4.  Battery voltage is 2.99.   IMPRESSION:  1. Sinus node dysfunction.  2. Status post pacer for sinus node dysfunction.  3. Ischemic heart disease with:      a.     Prior bypass.      b.     Status post mitral valve repair.      c.     Previous ejection fraction of 20-30%.  4. Peripheral edema.  5. Renal insufficiency.   Mr. Rivadeneira has done better with his diuresis and weight loss.  He has a  long way to go.  He has significant fluid overload today, so I spoke  with Dr. Caryn Section, who will increase his furosemide  from 40 b.i.d. today to  80 b.i.d.  Concomitantly, he will increase his potassium.   We have also talked about salt and water intake as well as importance of  exercise and weight loss especially since he cannot tolerate CPAP.   Amiodarone surveillance labs were okay in November.  I think one of the  other question we have to keep on the table is wheteher part of the  shortness of breath could be amiodarone lung toxicity.   We will see him again in 6 months' time.     Duke Salvia, MD, [facility]  Electronically Signed    SCK/MedQ  DD: 10/26/2008  DT: 10/27/2008  Job #: [id number]   cc:   Wilber Bihari. Caryn Section, M.D.  Dr. Franne Forts

## 2011-01-10 NOTE — Assessment & Plan Note (Signed)
[facility]                            CARDIOLOGY OFFICE NOTE   Marco [name], Marco [name]                      MRN:          [id number]  DATE:04/08/2007                            DOB:          1936/04/15    PRIMARY CARE PHYSICIAN:  [name], M.D.   REASON FOR PRESENTATION:  Evaluate patient with coronary disease and  cardiomyopathy.   HISTORY OF PRESENT ILLNESS:  The patient returns for followup.  He has  had problems with hypotension and fatigue.  Earlier in the year I had  reduced his beta-blocker but had maintained those at the most recent  visit in June.  Since seeing me he has continued to have systolic blood  pressures in the 80s.  Two weeks ago, he had his Coreg discontinued by  Dr. Sullivan Lone.  His blood pressure is now as described below.  At home it  is running in the 120s-140s/60s.  He says he has a little more energy.  He is working with physical therapy at home.  He has not yet been  outside walking.  He has not been having any chest discomfort.  He has  not been having any neck or arm discomfort.  He is not noticing any  palpitations.  He has had no presyncope or syncope.  There has been no  new shortness of breath.  He has had no new PND or orthopnea.  Weights  have been stable, and he has had no abdominal distension or lower  extremity swelling.   PAST MEDICAL HISTORY:  1. Cardiomyopathy (EF 25-30%).  2. Coronary artery disease status post CABG by Dr. [name] (June of 2006      with a left internal mammary artery to the LAD, SVG to circumflex,      and SVG to posterolateral).  3. Mitral valve repair with a #28 Edwards Logic ring.  4. Sick sinus syndrome status post pacemaker placement by Dr. [name].  5. Abdominal aortic aneurysm repair in May of 2008.  6. Sleep apnea.  7. Chronic renal insufficiency (followed by Dr. Marina Gravel).  8. Diabetes mellitus.  9. Hypertension.  10.Benign prostatic hypertrophy.  11.Anemia.  12.Deep  venous thrombosis on chronic Coumadin therapy.  13.Status post ICD implant with Medtronic EnRhythm.   ALLERGIES:  SHELLFISH.   MEDICATIONS:  1. Hydralazine 25 mg t.i.d.  2. Isosorbide 30 mg b.i.d.  3. Col-Rite stool softener.  4. Crestor 10 mg daily.  5. Hectorol 2.5 mg 3 times a week.  6. Coumadin.  7. Protonix 40 mg 3 times a week.  8. Aspirin 81 mg daily.  9. NovoLog.  10.Furosemide 80 mg b.i.d.  11.Potassium 20 mEq daily.   REVIEW OF SYSTEMS:  As stated in the HPI and otherwise negative for  other systems.   PHYSICAL EXAMINATION:  GENERAL:  The patient is in no acute distress.  VITAL SIGNS:  Blood pressure 140/68, heart rate 60 and regular, weight  239 pounds (decreased from previous of about 14 pounds).  Body mass  index 32.  HEENT:  Eyelids unremarkable.  Pupils are equal, round, and reactive to  light.  Fundi not visualized.  Oral mucosa unremarkable.  NECK:  No jugular venous distension at 45 degrees.  Carotid upstrokes  brisk and symmetric.  No bruits, thyromegaly.  LYMPHATICS:  No cervical, axillary, or inguinal adenopathy.  LUNGS:  Clear to auscultation bilaterally.  BACK:  No costovertebral angle tenderness.  CHEST:  Well-healed sternotomy scar and ICD pocket.  HEART:  PMI not displaced or sustained.  S1 and S2 within normal limits.  No S3, no S4, no clicks, no rubs, no murmurs.  ABDOMEN:  Well-healed surgical scar.  Obese.  Positive bowel sounds,  normal in frequency and pitch.  No bruits, no rebound, no guarding.  No  midline pulsatile mass.  No hepatomegaly, no splenomegaly.  SKIN:  No rashes, no nodules.  EXTREMITIES:  2+ pulses throughout.  No edema, no cyanosis, no clubbing.  NEUROLOGIC:  Oriented to person, place, and time.  Cranial nerves II-XII  grossly intact.  Motor grossly intact.   ASSESSMENT AND PLAN:  1. Cardiomyopathy.  The patient is now off the beta-blocker.  He is on      only hydralazine nitrates.  Because of his hypotension following       his abdominal aortic aneurysm and his continued fatigue, I will      keep him off his beta-blocker.  However, I did have along      discussion with the patient and his wife about the importance of      this class of agents if we can use it.  In the future, I do plan to      try to restart it, titrating from a low dose as tolerated.      Certainly I do not want the patient to be fatigued or hypotensive      but would like the mortality/morbidity benefits of a beta-blocker.      He will continue the hydralazine nitrate.  2. Coronary artery disease.  He is having no ongoing chest discomfort      consistent with angina.  No further cardiographic testing is      suggested.  3. Obesity.  He will continue to lose weight hopefully with a      combination of diet and increased exercise.  4. Chronic renal insufficiency.  This is followed by Dr. Caryn Section and Dr.      Sullivan Lone.  5. Chronic Coumadin therapy.  The patient remains on this for deep      venous thrombosis.  6. Followup.  I would like to see the patient back in 1 month to      reassess his heart failure medications.     [name], MD, [facility]  Electronically Signed    JH/MedQ  DD: 04/08/2007  DT: 04/09/2007  Job #: [id number]   cc:   [name]

## 2011-01-10 NOTE — Procedures (Signed)
CAROTID DUPLEX EXAM   INDICATION:  Followup of carotid disease, right carotid endarterectomy.   HISTORY:  Diabetes:  Yes.  Cardiac:  CAD, CABG, arrhythmia.  Hypertension:  Yes.  Smoking:  Previous.  Previous Surgery:  Right carotid endarterectomy.  CV History:  Weakness.  Amaurosis Fugax  No, Paresthesias  No, Hemiparesis  No                                       RIGHT             LEFT  Brachial systolic pressure:         143               139  Brachial Doppler waveforms:         Triphasic         Triphasic  Vertebral direction of flow:        Antegrade         Antegrade  DUPLEX VELOCITIES (cm/sec)  CCA peak systolic                   89                106  ECA peak systolic                   111               188  ICA peak systolic                   143               216  ICA end diastolic                   43                54  PLAQUE MORPHOLOGY:                  Calcified         Mixed  PLAQUE AMOUNT:                      Mild              Moderate  PLAQUE LOCATION:                    CCA               ICA, ECA   IMPRESSION:  1. 60-79% stenosis noted in the left internal carotid artery.  2. 40-59% stenosis noted in the right internal carotid artery.         ___________________________________________  Janetta Hora Fields, MD   CJ/MEDQ  D:  06/23/2009  T:  06/23/2009  Job:  [id number]

## 2011-01-13 NOTE — Op Note (Signed)
Marco [name], Marco [name]               ACCOUNT NO.:  [id number]   MEDICAL RECORD NO.:  [id number]          PATIENT TYPE:  INP   LOCATION:  2312                         FACILITY:  [facility]   PHYSICIAN:  [name]. [name], M.D. DATE OF BIRTH:  23-Mar-1936   DATE OF PROCEDURE:  02/01/2005  DATE OF DISCHARGE:                                 OPERATIVE REPORT   PREOPERATIVE DIAGNOSIS:  Severe three-vessel coronary artery disease with  moderate left ventricular dysfunction, mild mitral regurgitation, status  post acute non-Q-wave myocardial infarction.   POSTOPERATIVE DIAGNOSIS:  Severe three-vessel coronary artery disease with  moderate left ventricular dysfunction, moderate mitral regurgitation, status  post acute non-Q-wave myocardial infarction.   PROCEDURE:  Median sternotomy for coronary artery bypass grafting x3 (left  internal mammary artery to distal left anterior descending coronary artery,  saphenous vein graft to circumflex marginal branch, saphenous vein graft to  the left posterolateral branch, endoscopic saphenous vein harvest from right  thigh), and mitral valve repair (28 mm Edwards ET Logix ring annuloplasty).   SURGEON:  [name]. [name], M.D.   ASSISTANT:  [name], PA   ANESTHESIA:  General.   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 The patient presents with a several-day history of waxing and  waning symptoms of substernal chest pain. The patient also has progressive  symptoms of exertional shortness of breath and fatigue over the last several  months. He initially was seen by his primary care physician, Dr. [name] in [place]. He was sent to the emergency department where he was  evaluated by Dr. [name] at [facility]. His  initial set  of cardiac enzymes were positive. EKG findings were notable for  the absence of acute ST-segment elevation. He was taken directly to the  cardiac cath lab by Dr. Valetta Close where findings were notable for severe  three-vessel coronary artery disease with moderate to severe left  ventricular dysfunction and probably at least mild mitral regurgitation. The  patient was promptly transferred to [facility] for further  management and therapy.   OPERATIVE CONSENT:  The patient and his family have been counseled at length  regarding the indications and potential benefits of coronary artery bypass  grafting. Alternative treatment strategies have been discussed. They  understand and accept all associated risks of surgery including but not  limited to risk of death, stroke, myocardial infarction, congestive heart  failure, respiratory failure, pneumonia, bleeding requiring blood  transfusion, arrhythmia, infection, and recurrent coronary artery disease.  They also understand the plan for intraoperative transesophageal  echocardiogram to further assess the patient's left ventricular dysfunction  and severity of mitral regurgitation. They understand the possibility of  need for mitral valve repair if indicated based upon severity. All their  questions have been addressed.   OPERATIVE NOTE:  The patient is brought to the operating room on above-  mentioned date and central monitoring was established by the anesthesia  service under the care and direction of Burna Forts, M.D.  Specifically, a Swan-Ganz catheter is placed through the right internal  jugular approach. A radial arterial line was placed. Intravenous antibiotics  were administered. Following induction with general endotracheal anesthesia,  a Foley catheter is placed. The patient's chest, abdomen, both groins, and  both lower extremities were prepared and draped in sterile manner.   Baseline transesophageal  echocardiogram was performed by Dr. Jacklynn Bue. This  demonstrates moderate left ventricular dysfunction. Ejection fraction was  estimated around 40%. The inferior wall appears to be akinetic. There is  mild to moderate global hypokinesis. Initially there appears to be mild (1+)  mitral regurgitation. This appears to be classical ischemic mitral  regurgitation with type IIIB restriction of the posterior leaflet of mitral  valve. During the prebypass portion of the operation it should be noted that  intermittently the patient's pulmonary artery pressures increased  considerably measuring nearly 2/3 of the systemic blood pressure. When  repeat transesophageal echocardiogram was performed at times of elevated  pulmonary artery pressures, there is notably considerably more mitral  regurgitation (at least moderate, 3+) with a broad-based jet of  regurgitation filling virtually the entire left atrium. There does not  appear to be flow reversal in pulmonary veins. However, due to the large  regurgitant volume and the dynamic nature of the patient's ischemic mitral  regurgitation, concomitant mitral valve repair is felt to be indicated.   A median sternotomy incision was performed and the left internal mammary  artery was dissected from the chest wall and prepared for bypass grafting.  The left internal mammary artery is good-quality conduit. Simultaneously  saphenous vein was obtained from the patient's right thigh in the upper  portion right lower leg using endoscopic vein harvest technique. The  saphenous vein is good-quality conduit. After the saphenous vein was  removed, the small surgical incisions in the right lower extremity are  closed in multiple layers with running absorbable suture. The patient is  heparinized systemically. Of note, the patient appears to be quite resistant  to heparin requiring very large doses. Ultimately some recombinant antithrombin III is administered and the  patient's activated clotting times  elevate appropriately.   The pericardium was opened. The ascending aorta is notably normal in  appearance. The ascending aorta is cannulated for cardiopulmonary bypass. A  venous cannula was placed directly in the superior vena cava. A second  venous cannula was placed low in the right atrium with tip extending down  the inferior vena cava. A retrograde cardioplegic catheter was placed  through the right atrium into the coronary sinus.   Cardiopulmonary bypass was begun and the surface of heart is inspected. The  patient has left dominant coronary circulation. The posterior descending  coronary artery is very small, diffusely diseased, and chronically occluded.  It is felt not to be appropriate target for grafting. The predominant vessel  on the inferior wall is very large posterolateral branch off the distal left  circumflex coronary artery. There is scarring in the inferior wall  consistent of remote history of inferior wall myocardial infarction. There  is mild to moderate left ventricular hypertrophy. Portions of saphenous vein  and the left internal mammary artery are trimmed to appropriate lengths. A  temperature probe was placed in left ventricular septum. Cardioplegic  catheter is placed in the ascending aorta.   The patient is cooled to 32 degrees systemic temperature. Aortic crossclamp  was applied and cardioplegia is delivered initially in antegrade  fashion  through the aortic root. Iced saline slush was applied for topical  hypothermia. Supplemental cardioplegia administered retrograde through the  coronary sinus catheter. Initial cardioplegic arrest and myocardial cooling  are felt to be excellent. Repeat doses of cardioplegia administered  intermittently throughout crossclamp portion of the operation both through  the aortic root, down subsequently placed vein grafts, and retrograde  through the coronary sinus catheter to maintain septal  temperature below 15  degrees centigrade.   The following distal coronary anastomoses were performed:  1.  The circumflex marginal branch is grafted with a saphenous vein graft in      end-to-side fashion. This is the only sizable circumflex marginal branch      identified and it was not well visualized at the time of      catheterization. At the site of distal bypass it measures 1.5 mm in      diameter. It is a fair-quality target at best.  2.  The posterolateral branch off the distal left circumflex coronary artery      is grafted with saphenous vein graft in end-to-side fashion. This vessel      is good-quality target and measures 1.7 mm in diameter at the site of      distal bypass.  3.  The distal left anterior descending coronary artery is grafted with left      internal mammary artery. This vessel was diffusely diseased but at the      site of distal bypass it measures 2 mm in diameter and is of good      quality.  Left atriotomy incision was performed posteriorly through the interatrial  groove. The mitral valve was exposed using a self-retaining retractor.  Exposure is felt to be satisfactory. The mitral valve was examined. The  mitral valve apparatus appears normal. The mitral annulus was somewhat  enlarged. There appears to be some functional restriction of the posterior  leaflet of mitral valve. Mitral valve was repaired using the of ring  annuloplasty. Annuloplasty was performed using interrupted 2-0 Ethibond  horizontal mattress sutures placed circumferentially around the entire  mitral annulus. The mitral valve was sized to accept a 28 mm annuloplasty  ring. The size corresponds almost precisely to the dimensions of the  anterior leaflet of mitral valve. The Carpentier-Edwards [place]  annuloplasty ring (ET Logix model number 4100, serial number [id number]) is  secured in place uneventfully. After completion of the mitral valve repair,  the valve competency was  checked by instilling iced saline into the left  ventricular chamber. There appears to be no residual mitral regurgitation.  There is a nice symmetrical line of coaptation of the anterior and posterior  leaflet.   Rewarming was begun. The left atrial appendage was oversewn from within the  left atrium using a two-layer closure of running 3-0 Prolene suture. The  left atriotomy was closed using a two-layer closure of running 3-0 Prolene  suture. Both proximal saphenous vein anastomoses were performed directly to  the ascending aorta prior to removal of the aortic crossclamp. The patient  is placed in Trendelenburg position. The left internal mammary artery is  opened and left ventricular septal temperature rises rapidly. One final dose  of Hot Shot cardioplegia is administered retrograde through the coronary  sinus catheter. Aortic crossclamp was removed after total crossclamp time of  128 minutes.   The heart began to beat spontaneously without need for cardioversion. All  proximal and distal coronary anastomoses were inspected for hemostasis and  appropriate graft orientation. Epicardial pacing wires were affixed to right  ventricular outflow tract into the right atrial appendage. A Magoon needle  was placed in the ascending aorta to serve as a root vent. IVC cannula was  removed and its cannulation site oversewn with Prolene suture.   The patient is weaned from cardiopulmonary bypass without difficulty. The  patient's rhythm at separation from bypass is a slow junctional rhythm. AV  sequential pacing was employed. Normal sinus rhythm resumed spontaneously  shortly after separation from bypass and pacing was discontinued. The  patient is weaned from bypass on low-dose milrinone and dopamine infusion.  Total cardiopulmonary bypass time for the operation is 149 minutes.   Follow-up transesophageal echocardiogram was performed by Dr. Jacklynn Bue after  separation from bypass. This  demonstrates preserved left ventricular function. The inferior wall remains severely hypokinetic. The anterior wall  and lateral wall appear to be contracting very well. There is a well seated  mitral annuloplasty ring. There is no residual mitral regurgitation. There  is no residual air of significance.   The aortic root vent is removed. The SVC cannula was removed. The aortic  cannula was removed and protamine was administered to reverse the  anticoagulation. The mediastinum and left chest are irrigated with saline  solution containing vancomycin. Meticulous surgical hemostasis ascertained.   The mediastinum and left chest are drained with three chest tubes exited  through separate stab incisions inferiorly. The median sternotomy was closed  with double-strength sternal wire. Soft tissues anterior to sternum are  closed in multiple layers and the skin was closed with a running  subcuticular skin closure.   The patient tolerated the procedure well and was transported to the surgical  intensive care unit in stable condition. There are no intraoperative  complications. All sponge, instrument and needle counts were verified  correct at completion of the operation. No blood products were administered.       CHO/MEDQ  D:  02/01/2005  T:  02/02/2005  Job:  [id number]

## 2011-01-13 NOTE — H&P (Signed)
NAMEALTIN, Marco [name]               ACCOUNT NO.:  [id number]   MEDICAL RECORD NO.:  [id number]          PATIENT TYPE:  INP   LOCATION:  5010                         FACILITY:  [facility]   PHYSICIAN:  Altamese Cabal, M.D.  DATE OF BIRTH:  10-09-1935   DATE OF ADMISSION:  03/20/2005  DATE OF DISCHARGE:                                HISTORY & PHYSICAL   CHIEF COMPLAINT:  Weakness.   HISTORY OF PRESENT ILLNESS:  This is a 75 year old white male with coronary  artery disease, status post CABG x 3, type 2 diabetes, chronic renal  insufficiency who presents with generalized weakness x 1 week that has  acutely worsened over the past few days.  He also complains of dizziness on  standing, a decreased appetite and nausea.  The patient denies vomiting.  He  notes he was hospitalized 2-1/2 weeks ago with similar symptoms and  hypoglycemia.  The patient also notes that he has not had a bowel movement  in 4-5 days.  He denies chest pain, shortness of breath, dyspnea on  exertion, orthopnea.  He states that his blood sugars have been running in  the 150s to 170s.  He also has a right lower extremity ulcer and has been on  Cipro for seven days for this.  He denies fever and chills.  He does note  that his weight is down 40 pounds in the last two months.  The patient  denies depressed mood and anhedonia. No melena but has had some occasional  bright red blood around his stool that he attributes to hemorrhoids.   REVIEW OF SYSTEMS:  Positive for 40-pound weight loss, nausea, generalized  weakness and dizziness.  Negative for chest pain, shortness of breath,  dyspnea on exertion, orthopnea, vomiting, headache, visual changes and  numbness or tingling in extremities.  Also negative for melena, hematemesis  and hematochezia.  Please note that the patient and his family doctor is Dr.  Sullivan Lone in [place], his cardiologist is [name] and [place].   PAST MEDICAL HISTORY:  1.  Coronary artery bypass graft x  3 with metrial annuloplasty, last one in      June of 2006.  2.  Sinus node dysfunction status post pacemaker placement.  3.  Type 2 diabetes mellitus.  4.  Chronic renal insufficiency:  Baseline is 1.4 to 2.0.  5.  Iron-deficiency anemia:  Baseline hemoglobin is 10.  6.  Hypertension.  7.  Hyperlipidemia.  8.  External hemorrhoids.   MEDICATIONS:  1.  Coreg 3.125 mg p.o. b.i.d.  2.  Coumadin 2 mg p.o. daily.  3.  Avandia 4 mg p.o. b.i.d.  4.  Benicar 40 mg p.o. daily.  5.  Sliding scale insulin.  6.  Folate.   ALLERGIES:  None.   SOCIAL HISTORY:  The patient lives with his girlfriend.  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  He quit smoking in 1998 after a 17-pack-  year history.  He occasionally drinks alcohol.  None since his surgery in  June.  No illicit drug use.   FAMILY HISTORY:  He has three  brothers who have all passed away from MIs in  their 7s or 55s.  Mother died of an MI in her 54s.  Father died of an MI at  63.  He has a sister that is living with diabetes.   PHYSICAL EXAMINATION:  VITAL SIGNS:  Temperature 97.1, blood pressure 62-  101/43-58 orthostatics.  Blood pressure lying is 90/58, standing 62/43.  Pulse 60-80, respirations 24, oxygen 94-98% on room air.  GENERAL:  This is an alert, oriented x3, pleasant and cooperative, well-  nourished, well-developed male in no acute distress.  HEENT:  Head is normocephalic and atraumatic.  Pupils are equal, round and  reactive to light and accommodation.  Extraocular movements intact.  Conjunctiva clear.  Dried mucous membranes.  Oropharynx without erythema or  exudate.  SKIN:  Decreased skin turgor.  The patient has a right lower extremity ulcer  on the medial aspect of his leg that is 2 cm x 1.5 cm that has purulent  drainage and surrounding erythema extending about 2 cm out from the ulcer  and some swelling.  CARDIOVASCULAR:  Regular rate and rhythm.  No murmurs, rubs, or gallops.  Pulse is 2+ and equal  in extremities.  LUNGS:  Clear to auscultation bilaterally.  Good air movement.  ABDOMEN:  Soft, nontender, nondistended, no hepatosplenomegaly.  GU:  Deferred.  MUSCULOSKELETAL:  Full range of motion in all extremities.  No joint  swelling.  NEURO:  Cranial nerves 2-12 are intact. Strength 5/5 throughout.  Reflexes  2+ and equal throughout.  Gait not observed.   LABORATORY DATA:  Sodium 133, potassium 4.7, chloride 96, bicarb 26, BUN 47,  creatinine 4.1, glucose 175.  AST 18, ALT 12, alkaline phosphatase 69, total  bilirubin 0.7.  White blood count 8.5,  hemoglobin 10.8, hematocrit 31.8,  platelets 298, MCV 86.  EKG normal sinus rhythm.  No ST changes.  No Q  waves.  Pacemaker spikes are present.  He does have some inverted T waves in  leads II AVF and V4 through V6.  CK is 140, CKMB 1.8, relative index 1.3,  troponin 0.05, INR 3.6, PT 36.6, BMP 285.  Chest x-ray with no acute  findings.  Echo with no pericardial effusion.  EF is about 50%.   ASSESSMENT/PLAN:  This is a 75 year old male with dehydration.   Problem 1. Dehydration.  The patient is orthostatic and has decreased skin  turgor.  Therefore, I think he is volume depleted given his history of poor  p.o. intake.  We will gently hydrate him with normal saline at 100 cc per  hour and closely monitor his fluid status with daily weights and exams.  We  will repeat orthostatics in the morning.   Problem 2. Acute and chronic renal insufficiency.  The patient's baseline  creatinine is about 1.4-2.0.  We suspect that it is acutely increased  secondary to three renal failures.  See Problem 1.   Problem 3. Coronary artery disease.  The patient is on Coumadin and  supertherapeutic.  EKG has nonspecific changes.  Cardiac enzymes are  negative.  We will repeat the EKG in the a.m.   Problem 4. Anticoagulation.  He is on Coumadin 2 mg p.o. daily.  His INR is supertherapeutic at 3.6.  We will continue the current dose of Coumadin but   stop the Cipro since it can increase the INR.  We will recheck an INR in the  a.m.   Problem 5. Diabetes.  We will check a hemoglobin  A1C.  Continue his home  regimen of Avandia and sliding scale insulin for now.  ADA diet, q.a.c. and  q.h.s. capillary glucoses.   Problem 6. Cellulitis.  We will start him on Omnicef for oral coverage of  staph strep and pseudomonas since the patient is diabetic.  We will do  b.i.d. wet-to-dry dressing changes.   Problem 7. Decreased appetite.  There is no clear reason for poor appetite.  He denies depressed mood and anhedonia.  We will get a nutrition consult and  check his albumin.   Problem 8. Constipation.  This is likely secondary to decreased p.o. intake.  We will give him Colace as needed.   Problem 9.  Anemia.  This is stable.  We have ordered iron studies.  We have  a low threshold for transfusing him since he has coronary artery disease.  We will recheck his CBC in the morning.   Problem 10. Fluids and electrolytes and nutrition.  We are gently hydrating  him with normal saline at 100 cc per hour, heart-healthy diet and nutrition  consult.       KS/MEDQ  D:  03/21/2005  T:  03/21/2005  Job:  [id number]

## 2011-01-13 NOTE — Assessment & Plan Note (Signed)
[facility]                              CARDIOLOGY OFFICE NOTE   Marco [name], Marco [name]                      MRN:          [id number]  DATE:05/28/2006                            DOB:          09/20/1935    PRIMARY:  Dr. [name].   REASON FOR PRESENTATION:  Evaluate patient with ischemic cardiomyopathy.   HISTORY OF PRESENT ILLNESS:  The patient is a 75 year old gentleman who I am  meeting for the first time.  He will be new to the heart failure clinic.  He  has a history of an ischemic cardiomyopathy.  He has had med titration in  our clinic. He did not tolerate 18.75 mg b.i.d. of Coreg.  He is seeming to  do relatively well on the current regimen.  He does have renal insufficiency  and has this followed closely by Dr. Caryn Section.  He seems to have a stable  creatinine of 2.8 currently.   He denies any new symptoms.  He gets dyspnea climbing a flight of stairs  (Class II).  However, he does not have resting shortness of breath and has  no PND or orthopnea.  He has no palpitation, presyncope or syncope.  He has  no chest discomfort.  He has had some mild lower extremity swelling.  He has  been eating too much and has gained weight.  He does not weigh himself  daily.  He does try to restrict salt.   PAST MEDICAL HISTORY:  1. Coronary artery disease status post CABG June of 2006 (Dr. [name]).  2. LIMA to the LAD, SVG to circumflex marginal, SVG to posterolateral.  3. Mitral valve repair with a 28 mm Edwards ET Logix ring annuloplasty.  4. Sick sinus syndrome status post pacemaker placement by Dr. [name].  5. Abdominal aortic aneurysm followed by Dr. Darrick Penna.  6. Chronic renal insufficiency.  7. Diabetes mellitus.  8. Hypertension.  9. Benign prostatic hypertrophy.  10.Hemorrhoids.  11.Anemia.  12.History of DVT, on chronic Coumadin therapy.  13.Cardiomyopathy with an EF 25-30%.   ALLERGIES:  SHELLFISH.   MEDICATIONS:  1. Coumadin.  2.  Crestor 10 mg daily.  3. Aspirin 81 mg daily.  4. NovoLog 70/30 q.a.m., q.p.m.  5. Lasix 100 mg b.i.d.  6. Imdur 30 mg b.i.d.  7. Hydralazine 25 mg t.i.d.  8. Coreg 12.5 mg b.i.d.  9. Hectorol 2.5 mg three times per week.  10.Docusate.  11.Protonix 40 mg three times per week.   REVIEW OF SYSTEMS:  As stated in the HPI, and otherwise negative for other  systems.   PHYSICAL EXAMINATION:  The patient is in no distress.  Weight 261 pounds,  body mass index 35, blood pressure 118/72, heart rate 64 and regular.  HEENT:  Eyes unremarkable, pupils equal, round and react to light, fundi not  visualized.  NECK:  No jugular venous distention at 45 degrees, carotid upstroke brisk  and symmetric, no bruits, no thyromegaly.  LYMPHATICS:  No cervical, axillary or inguinal adenopathy.  LUNGS:  Clear to auscultation bilaterally.  BACK:  No costovertebral angle tenderness.  CHEST:  Well healed sternotomy scar.  HEART:  PMI not displaced or sustained, S1 and S2 within normal limits, no  S3, no S4, no murmurs.  ABDOMEN:  Obese, positive bowel sounds normal in frequency and pitch, no  bruits, no rebound, no guarding, no midline pulses or masses, no __________,  no hepatosplenomegaly.  SKIN:  SKIN:  No rashes, no nodules.  EXTREMITIES:  Pulses 2+, trace bilateral lower extremity edema, no cyanosis,  no clubbing.  NEURO:  Oriented to person, place and time, cranial nerves II-XII grossly  intact, motor grossly intact.   ASSESSMENT AND PLAN:  1. Ischemic cardiomyopathy.  The patient seems to be on a stable      medication regimen.  He did not tolerate any titration of his      medications.  Therefore, will continue at the current regimen.  I took      a long time to discuss salt and fluid restriction and daily weights.      We gave him an information packet.  Will continue to educate him.  2. Obesity.  He understands the need to lose weight with diet and exercise      and I gave him a specific  regimen for this and hopefully he will be      able to tolerate starting slowly.  3. Renal insufficiency and hypokalemia.  He did have a potassium of 3.2 on      his last BMET.  Therefore, I will take the liberty of getting another      one.  I will share this with Dr. Sullivan Lone and Dr. Caryn Section.  4. Coronary disease.  He will continue with secondary risk reduction.  I      will defer follow up of      his lipids to Dr. Sullivan Lone, with a goal LDL less than 70 and HDL in the      40's.  5. Follow up.  I would like to see him back in two months or sooner.            ______________________________  [name], MD, [facility]     JH/MedQ  DD:  05/28/2006  DT:  05/28/2006  Job #:  [id number]   cc:   Mort Sawyers. Caryn Section, M.D.

## 2011-01-13 NOTE — Consult Note (Signed)
[name], [name]               ACCOUNT NO.:  [id number]   MEDICAL RECORD NO.:  [id number]          PATIENT TYPE:  INP   LOCATION:  2022                         FACILITY:  [facility]   PHYSICIAN:  Marco [name]. Marco [name], M.D.  DATE OF BIRTH:  15-Sep-1935   DATE OF CONSULTATION:  02/08/2005  DATE OF DISCHARGE:                                   CONSULTATION   REASON FOR CONSULTATION:  Evaluation of sinus node dysfunction.   HISTORY OF PRESENT ILLNESS:  The patient is a 75 year old man with a history  of known coronary artery disease status post angioplasty in the past status  post bypass surgery.  The patient was in his usual state of health until he  presented with unstable angina and subsequently underwent catheterization  demonstrating severe three vessel coronary artery disease.  He subsequently  underwent coronary artery bypass grafting on June 5.  Preoperatively, his EF  was 30-40%.  At that time, he was found to have significant mitral valve  disease and underwent mitral valve repair.  Postoperatively, the patient has  done well except that he has developed symptomatic bradycardia secondary to  sinus node dysfunction with junctional escape rhythm.  His heart rates have  been between 30 and 50 beats per minute at rest but has not increased with  exertion.  He is now referred for consideration for pacemaker insertion as  he has been on no sinus node or AV nodal blocking agents.   PAST MEDICAL HISTORY:  Notable for hypertension, hyperlipidemia, and  obesity.  He has had a 10-12 year history of diabetes.  He has peripheral  vascular disease and cerebrovascular disease.   SOCIAL HISTORY:  The patient lives in [place].  He is a retired Physicist, medical.  He quit smoking, as noted, over 18 years ago.  He drinks 1-2  alcoholic beverages per week.   FAMILY HISTORY:  Notable for a mother who died at age 40 of MI and father  died at age 26 of MI.  He has had three brothers who died of MIs, one  at 22,  one at 39, and one at 38.   REVIEW OF SYMPTOMS:  Notable for a 15 pound weight loss in the last several  months.  He denies fevers, chills, or night sweats.  He denies headache,  vision or hearing problems.  He denies cough or hemoptysis.  He does have  shortness of breath with exertion.  He also has dizziness.  He denies  claudication but does have a chronic cough.  He denies wheezing.  He denies  dysuria and hematuria but does have occasional nocturia.  He denies numbness  or depression but does have generalized weakness.  He denies nausea,  vomiting, diarrhea, and constipation.  He does have reflux symptoms.  He  denies polyuria, polydipsia, heat or cold intolerances.  He denies  arthralgias or joint deformities.  The rest of his review of systems was  negative.   PHYSICAL EXAMINATION:  GENERAL:  Pleasant, obese middle aged man in no distress.  VITAL SIGNS:  Blood pressure 100/50, pulse 50 and regular,  respirations 18,  temperature 97.  HEENT:  Normocephalic, atraumatic, pupils equal and round, oropharynx moist,  sclerae anicteric.  NECK:  7 cm jugular venous distention, there is no thyromegaly, trachea was  midline, carotids 2+ and symmetric.  There were soft bilateral bruits  appreciated.  HEART:  Regular bradycardia with normal S1 and S2.  There were no obvious  murmurs.  LUNGS:  Clear to auscultation bilaterally.  No wheezes, rales, or rhonchi.  There is no increased work of breathing.  ABDOMEN:  Soft, nontender, nondistended, no organomegaly.  He was obese.  EXTREMITIES:  No cyanosis or clubbing, but he did have bilateral lower  extremity edema (1-2+).  NEUROLOGICAL:  Alert and oriented to person, place, and date.  Cranial  nerves 2-12 intact, strength 5/5 and symmetric.   LABORATORY DATA:  EKG demonstrates a junctional bradycardia at 40-50 beats  per minute.  His INR is 2.5.   IMPRESSION:  1.  Symptomatic bradycardia secondary to sinus node dysfunction.  2.   Status post coronary artery bypass graft surgery.  3.  Status post mitral valve repair.  4.  Status post MI with an EF of 30-40%.   DISCUSSION:  Marco [name] will require permanent pacemaker insertion  secondary to his sinus node dysfunction.  Because of his recent  revascularization despite of his LV dysfunction, he is not a prophylactic  ICD candidate at the present time.  I plan to hold his Coumadin until his  pacemaker can be placed.       GWT/MEDQ  D:  02/08/2005  T:  02/08/2005  Job:  [id number]

## 2011-01-13 NOTE — Assessment & Plan Note (Signed)
[facility]                            CARDIOLOGY OFFICE NOTE   Marco [name], Marco [name]                      MRN:          [id number]  DATE:08/10/2006                            DOB:          05-Dec-1935    PRIMARY CARE PHYSICIAN:  Dr. [name].   REASON FOR VISIT:  Patient with ischemic cardiomyopathy.   HISTORY OF PRESENT ILLNESS:  The patient returns for followup of the  above. He is 75 years old. He has done well since I last saw him. He is  having his creatinine followed by Dr. Caryn Section. He is due to have his  aneurysm followed again by Dr. Darrick Penna in January. He thinks he may do  some repair of this. Currently he denies any new symptoms. He gets short  of breath climbing a flight of stairs (class II symptoms).  However, he  has had no change in this. He has a decreased energy level but this is  baseline. He is able to do some activities such as golfing. He denies  any chest pressure, neck discomfort, arm discomfort, activity induced  nausea, vomiting, excessive diaphoresis. He has had no palpitations,  presyncope or syncope.   Of note, he does have sleep apnea. This was diagnosed a while ago but he  never tolerated the CPAP.   PAST MEDICAL HISTORY:  1. Cardiomyopathy (EF 25-30%).  2. Coronary artery disease status post CABG in June 2006 by Dr. [name]      (LIMA to the LAD, SVG to the circumflex, SVG to posterolateral),      mitral valve repair with a #28 mm Ramon Dredge ET logix ring and      annuloplasty, sick sinus syndrome status post pacemaker placement      by Dr. [name], abdominal aortic aneurysm, sleep apnea, chronic renal      insufficiency, diabetes mellitus, hypertension, benign prostatic      hypertrophy, hemorrhoids, anemia, history of deep venous thrombosis      following pacemaker placement treated with Coumadin therapy,      cardiomyopathy with an EF of 25-30%.   ALLERGIES:  SHELLFISH.   MEDICATIONS:  1. Coumadin per our Coumadin  clinic.  2. Crestor 10 mg q. day.  3. Aspirin 81 mg q. day.  4. Lasix 100 mg b.i.d.  5. Hydralazine 25 mg t.i.d.  6. Coreg 12.5 mg b.i.d.  7. Hectorol 2.5 mg 3 times a week.  8. Protonix 40 mg 3 times per week.  9. Docusate sodium.  10.NovoLog.  11.Isosorbide 30 mg b.i.d.   REVIEW OF SYSTEMS:  As stated in the HPI and otherwise negative for  other systems.   PHYSICAL EXAMINATION:  GENERAL:  The patient is in no distress.  VITAL SIGNS:  Blood pressure 122/72, heart rate 63 and regular, weight  262 pounds, body mass index 35.  HEENT:  Eyes unremarkable. Pupils equal round and reactive to light.  Fundi not visualized. Oral mucosa unremarkable.  NECK:  No jugular venous distention at 45 degrees, carotid upstroke  brisk and symmetric, no bruits, no thyromegaly.  LYMPHATICS:  No cervical, axillary or inguinal  adenopathy.  LUNGS:  Clear to auscultation bilaterally.  BACK:  No costovertebral angle tenderness.  CHEST:  Well-healed sternotomy scar and pacemaker pocket.  HEART:  PMI not displaced or sustained, S1 and S2 within normal limits,  no S3, no S4, no murmurs.  ABDOMEN:  Obese, positive bowel sounds, normal frequency and pitch, no  bruits, no rebound, no guarding, no midline pulsatile mass, no  hepatomegaly or splenomegaly.  SKIN:  No rashes, no nodules.  EXTREMITIES:  2+ pulses, no cyanosis, clubbing or edema.   EKG :  Electronic atrial paced rhythm. The axis is within normal limits,  interval is within normal limits, poor anterior R wave progression, no  acute ST-T wave changes.   ASSESSMENT/PLAN:  1. Cardiomyopathy. The patient has not tolerated up-titration of his      medications. He has class II symptoms at this point. No further      cardiovascular testing is suggested. He understands salt and fluid      restrictions.   Of note, we did begin the discussion of an implantable defibrillator. He  would statistically benefit from this and meets criteria. I think at  this  point I am going to wait until after the first of the year when  there are decisions made on his aneurysm repair. I would repeat an  echocardiogram in the spring. I doubt that he is going to have  significant improvement as he has not yet. We would then discuss  defibrillator placement.  1. Obesity. He understands he needs to lose weight with diet and      exercise.  2. Followup. I will see him back in the spring but would be happy to      be involved should he go on to have aneurysm repair and need      cardiovascular followup.     [name], MD, [facility]  Electronically Signed    JH/MedQ  DD: 08/10/2006  DT: 08/10/2006  Job #: [id number]   cc:   [name]

## 2011-01-13 NOTE — Op Note (Signed)
NAMECHAVIS, Marco [name]               ACCOUNT NO.:  [id number]   MEDICAL RECORD NO.:  [id number]          PATIENT TYPE:  INP   LOCATION:  2022                         FACILITY:  [facility]   PHYSICIAN:  Duke Salvia, M.D.  DATE OF BIRTH:  15-Mar-1936   DATE OF PROCEDURE:  02/09/2005  DATE OF DISCHARGE:                                 OPERATIVE REPORT   PREOPERATIVE DIAGNOSIS:  Sinus node dysfunction; status post bypass surgery  with depressed left ventricular function and mitral regurgitation.   POSTOPERATIVE DIAGNOSIS:  Sinus node dysfunction; status post bypass surgery  with depressed left ventricular function and mitral regurgitation.   OPERATION PERFORMED:  Dual chamber pacemaker implantation.   DESCRIPTION OF PROCEDURE:  Following the obtaining of informed consent, the  patient was brought to the electrophysiology laboratory and placed on the  fluoroscopic table in supine position.  After routine prep and drape of the  left upper chest, lidocaine was infiltrated in the prepectoral subclavicular  region.  An incision was made and carried down to the layer of the  prepectoral fascia using electrocautery and sharp dissection.  A pocket was  formed similarly.  Hemostasis was obtained.   Thereafter, attention was turned to gaining access to the extrathoracic left  subclavian vein which was accomplished without difficulty, without the  aspiration of air or puncture of the artery.  Two separate venipunctures  were accomplished.  Guidewires were placed and retained.  0 silk suture was  placed in a figure-of-eight fashion and allowed to hang loosely.  Subsequently a 7 [place] tear-away sheath was placed through which were  passed sequentially a Medtronic 5076 58 cm active fixation ventricular lead,  serial number [id number] and a Medtronic 5076 52 cm active fixation atrial  lead, serial number [id number].  Under fluoroscopic guidance these were  manipulated to the right ventricular apex and  the right atrial free wall  respectively where the bipolar R wave was 12.2 mV and pacing impedance of  811 ohms and threshold of 1V at 0.5 msec.  Current at threshold was 1.2 mA  and there was no diaphragmatic pacing at 10 V.   The bipolar P-wave was 2.2 mV with a pacing impedance of 567 ohms and a  threshold of 1.2 V at 0.5 msec.  Current at threshold was 2.8 mA and again  there was no diaphragmatic pacing at 10 V.  With these acceptable parameters  recorded, the leads were secured to the prepectoral fascia.  The ventricular  lead was marked with a tie and then the leads were attached to a Medtronic  EnRhythm [id number] pulse generator, serial number [id number].  Atrial pacing  was identified.  The pocket was copiously irrigated with antibiotic  containing saline solution.  Hemostasis was assured.  The leads and the  pulse generator were placed in the pocket and secured to the prepectoral  fascia.   The wound was then closed in three layers in a more normal fashion having  been copiously irrigated with antibiotic containing saline solution.  The  wound was washed and dried and benzoin and Steri-Strip dressing was applied.  Sponge, needle and instrument counts were correct at the end of the  procedure according to the staff.  The patient tolerated the procedure with  some shortness of breath.  His oxygen had inadvertently been turned off.  This was activated.  We also gave him 40 mg of Lasix.   Silhouette of the lateral margin demonstrated good movement consistent with  no evidence of tamponade.   Otherwise the patient tolerated it well and was stable at the end of the  procedure.       SCK/MEDQ  D:  02/09/2005  T:  02/09/2005  Job:  [id number]   cc:   Dr. Arnoldo Hooker   Electrophysiology Lab   [facility]   Dr. __________

## 2011-01-13 NOTE — Discharge Summary (Signed)
NAMEJAX, KENTNER               ACCOUNT NO.:  [id number]   MEDICAL RECORD NO.:  [id number]          PATIENT TYPE:  INP   LOCATION:  2022                         FACILITY:  [facility]   PHYSICIAN:  [name]. [name], M.D. DATE OF BIRTH:  01/17/1936   DATE OF ADMISSION:  01/31/2005  DATE OF DISCHARGE:  02/12/2005                                 DISCHARGE SUMMARY   PRIMARY DIAGNOSIS:  Coronary artery disease.   ADDITIONAL/DISCHARGE DIAGNOSES:  1.  Severe three-vessel coronary artery disease.  2.  Mild mitral regurgitation.  3.  Status post acute non-Q-wave myocardial infarction.  4.  Poorly-controlled insulin-dependent diabetes.  5.  Hypertension.  6.  Hyperlipidemia.  7.  Sinus node dysfunction.  8.  Hyperlipidemia.  9.  Benign prostatic hypertrophy.  10. Gastroesophageal reflux disease.  11. Hiatal hernia.  12. Acute on chronic renal insufficiency.  13. Postoperative anemia.   PROCEDURES:  1.  Coronary artery bypass grafting x3 (left internal mammary artery to the      LAD, saphenous vein graft to the obtuse marginal, saphenous vein graft      to the left posterior lateral branch).  2.  Endoscopic vein harvest, right thigh.  3.  Mitral valve repair with 28 mm Edwards ET Logix ring annuloplasty.  4.  Placement of a dual-chamber permanent pacemaker.   HISTORY:  The patient is a 75 year old white male who presented with a  several day history of chest pain which was accompanied by shortness of  breath, nausea, vomiting, and diaphoresis. The symptoms waxed and waned and  he was seen by his physician Dr. Sullivan Lone in [place]. Because his symptoms  sounded like unstable anginal type symptoms, he was instructed to present to  the emergency department at [facility] for further evaluation. He was seen  there by Dr. Gwen Pounds and an EKG showed normal sinus rhythm with a septal  infarct of undetermined age and nonspecific ST changes. He was admitted for  further evaluation. He underwent  cardiac catheterization on 01/30/2005. This  revealed significant three-vessel coronary artery disease with moderate to  severe left ventricular dysfunction and mild mitral regurgitation. Because  of these findings, it was felt that he would benefit from surgical  revascularization. Therefore, he was transferred to [facility] for  cardiac surgery evaluation.   HOSPITAL COURSE:  The patient was transferred to [facility] and admitted on  01/31/2005. He was seen by Dr. Tressie Stalker who agreed that he would  benefit from coronary artery bypass surgery at this time. He explained the  risks, benefits, and alternatives of the surgery to the patient and he  agreed to proceed. During his workup, it was discovered that he had had two  transient episodes of right-sided weakness recently. A carotid duplex scan  was performed which showed bilateral 60-80% carotid artery stenosis. It was  not felt that this would require intervention at this time and could be  followed up as an outpatient. The patient was able to proceed with surgery  on 02/01/2005. He underwent CABG x3, as well as, mitral valve repair as  described in detail above.  He was transferred to the SICU in stable  condition. He was able to be extubated shortly after surgery. He was  initially maintained on the Glucommander insulin protocol for his diabetes  as well as a dopamine drip for hypotension. Over the course of the first 48  hours postoperative, these drips were weaned and discontinued. By postop day  three, he was ready for transfer to the floor. Postoperative course has been  complicated by several issues. The first was sinus node dysfunction. The  patient initially was noted to be in a junctional rhythm and required AAI  pacing. Eventually, he was able to be weaned from the pacer and continued to  remain in junctional rhythm with a heart rate of 60. Ultimately, an  electrophysiology consult was obtained and the patient was  seen by Dr.  Ladona Ridgel. It was agreed that in light of the sinus node dysfunction, he would  require permanent pacemaker. On 02/09/2005, he underwent implantation of a  dual chamber pacemaker by Dr. [name]. He tolerated the procedure well. The  pacer was interrogated the following day and has been functioning  appropriately. His postoperative course has also been complicated by  extremely poor control of his blood sugars. As previously mentioned, he was  initially maintained on the Glucommander insulin protocol as well as an  insulin drip. He was started on Lantus insulin and the Glucommander was  discontinued. He was also started on Glucotrol as well as Avandia and  metformin but continued to have poor control of blood sugars with sugars  running in the 200's-300's. On admission, his hemoglobin A1c was 9.9  indicating poor preoperative control as well. Eventually, the Lantus was  discontinued and he was restarted on his home dose of Lantus. These were  titrated upward. However, his blood sugars remained poorly-controlled with  some hypoglycemic events with sugars in the 60's and sugars running up to  the 300's. [facility] consultation was obtained for  assistance in adjusting his medications. They recommended discontinuation of  the p.o. medications and maintenance on the NovoLog 70/30 at 60 units in the  morning, in addition to sliding scale insulin. This medication regimen has  now been implemented and his sugars will be watched closely over the next 24-  48 hours to make sure he will have no further problems. Also, during his  postoperative course, his creatinine began to trend upward and ultimately  peaked at 2.5. Because of this, the metformin he had previously been started  on for his blood sugars was discontinued. He had also been started on  diuretics and this was held. His creatinine has began to trend downward and is now 2.4. His baseline on admission was 1.4. He is  scheduled to have labs  drawn and this will be followed closely for next 24-48 hours as well.  Otherwise, the patient has been doing well. He has remained afebrile and  vital signs have been stable. He has been very volume-overloaded and  initially required aggressive diuresis with b.i.d. dose of Lasix. At  present, he is diuresing well and is back down to around his preoperative  weight. His dose has been decreased to a daily dose and it is anticipated  that he will most likely only need to continue the Lasix for approximately 5  days post discharge. He has also been mildly anemic and required p.o. iron  supplementation. He has been ambulating the halls cardiac rehab phase one  and is doing very well. His surgical  incision sites are all healing well.   PHYSICAL EXAMINATION:  On physical exam, his lower extremities still have 1+  pedal edema. His lungs clear and he is maintaining normal sinus rhythm,  paced.   LABORATORY STUDIES:  His labs on 02/11/1995 showed hemoglobin of 9.4,  hematocrit 27.4, platelets 524, white count 9.4, sodium of 34, potassium  4.18, BUN 69, and creatinine 2.4. He has been started on Coumadin and at  present, his INR is supratherapeutic at 3.2. Therefore, his Coumadin is  being held. His most recent chest x-ray shows no evidence of pneumothorax  and showed stable atelectasis. Again, he will be carefully monitored over  the next 48 hours and it is anticipated if he is continuing to improve, at  that time, he will be ready for discharge home most likely on 02/12/2005.   DISCHARGE MEDICATIONS:  1.  Coumadin. Home dose will be determined by PT and INR drawn on the day of      discharge.  2.  Coreg 3.125 milligrams b.i.d.  3.  Avandia 4 milligrams b.i.d.  4.  Folic acid 1 mg daily.  5.  Nu-Iron 150 mg daily.  6.  Benicar 40 milligrams daily.  7.  NovoLog 70/30, 60 units q.a.m.  8.  Lasix 40 milligrams daily x 5 days.  9.  K-Dur 20 mEq daily x 5 days.  10. Tylox  1-2 q.4 h p.r.n. for pain.   DISCHARGE INSTRUCTIONS:  He is asked to refrain from driving, heavy lifting,  or strenuous activity. He may continue ambulating daily and using his  incentive spirometer. He may shower daily and clean his incisions with soap  and water. He will continue a low fat, low-sodium, carbohydrate modified,  medium calorie diet.   DISCHARGE FOLLOWUP:  He is asked to have PT and INR drawn on 02/14/2005 by  Dr. Philemon Kingdom office.  He will see Dr. Gwen Pounds on 02/22/2005 at 11:15 a.m.  He will have a chest x-ray at that visit. Dr. Orvan July office will call to  schedule a 3-week follow-up appointment. He will bring his chest x-ray to  this visit. He is asked to check his blood sugars and make an appointment to  see Dr. Sullivan Lone in 1-2 weeks to check his glucose control. He wanted a pacer  check in three months with Dr. Philemon Kingdom office and this appointment has been scheduled for Wednesday, 04/05/2005 at 11:00 a.m. He will call our  office in the interim if he experiences any problems or has questions.       GC/MEDQ  D:  02/10/2005  T:  02/11/2005  Job:  [id number]   cc:   Arnoldo Hooker, M.D.   Richard Sullivan Lone  [address]  [place]  [place] [address]  Fax: [phone]

## 2011-01-13 NOTE — Discharge Summary (Signed)
Marco [name], Marco [name]               ACCOUNT NO.:  [id number]   MEDICAL RECORD NO.:  [id number]          PATIENT TYPE:  INP   LOCATION:  5010                         FACILITY:  [facility]   PHYSICIAN:  Asencion Partridge, M.D.     DATE OF BIRTH:  23-Apr-1936   DATE OF ADMISSION:  03/20/2005  DATE OF DISCHARGE:  03/23/2005                                 DISCHARGE SUMMARY   DISCHARGE DIAGNOSES:  1.  Dehydration.  2.  Acute-on-chronic renal insufficiency.  3.  Gastroparesis.  4.  Supratherapeutic INR.  5.  Diabetes mellitus with autonomic dysfunction.  6.  Anemia.  7.  Cellulitis.  8.  Coronary artery disease.   PROCEDURES:  The patient had an abdominal ultrasound that showed a 4.4-cm  abdominal aortic aneurysm.   DISCHARGE MEDICATIONS:  1.  Avandia 4 mg b.i.d.  2.  Protonix 40 mg b.i.d.  3.  Iron sulfate 325 mg b.i.d.  4.  Colace 100 mg b.i.d.  5.  Reglan 10 mg t.i.d. before meals.  6.  Crestor 10 mg nightly.  7.  Anusol-HC suppositories t.i.d. p.r.n.  8.  Avelox 400 mg daily x7 days.  9.  Coumadin 2 mg daily.  10. Coreg as directed prior to hospitalization.   HOSPITAL COURSE:  The patient presented to the emergency department with  generalized weakness for 1 week that had acutely worsened over the days  prior to admission.  He complained of dizziness on standing with a decreased  appetite and nausea, although denied vomiting.  The patient's physical exam  was significant for a temperature of 97.1, a blood pressure of 62-101/43-58  with positive orthostatic hypotension; his blood pressure lying down was  90/58, standing it was 62/43.  The pulse varied between 60 and 80.  Respirations were 24 and oxygen saturation was 94% to 98% on room air.  His  skin showed decreased turgor.  He also had a right lower extremity ulcer on  the medial aspect of his leg that was 2 x 1.5 cm with purulent drainage and  surrounding erythema.  Cardiovascularly, he had a regular rate and rhythm  with no  murmurs, rubs, or gallops.  His lungs were clear to auscultation and  his abdomen was soft, nontender and non-distended.  The rest of his exam was  noncontributory.   His labs upon admission were a sodium of 133, a potassium of 4.7, a chloride  of 96, a bicarb of 26, a BUN of 47 and creatinine of 4.4.  Glucose was 175.  AST was 18, ALT 12, alkaline phosphatase 69 and total bilirubin 0.7.  WBC of  8.5, hemoglobin 10.8, hematocrit 31.8, platelets 298,000, MCV 86.   EKG showed normal sinus rhythm with no ST changes, no Q waves, with  pacemaker spikes.  He did have inverted T waves in leads II, aVF and V4  through V6.   His CK was 140, his CK-MB was 1.8, relative index 1.3, troponin 0.05.  INR  of 3.6, PT of 36.6.  BNP of 285.   Chest x-ray showed no acute findings and the echo showed no pericardial  effusions with an EF of about 50%.   HOSPITAL COURSE:  PROBLEM #1 - DEHYDRATION:  The patient was admitted with  orthostatic hypotension, poor skin turgor and a creatinine of 4.1, and the  patient was started on IV fluids and given his history of poor p.o. intake,  he was encouraged to hydrate via p.o. as well as IV.  His fluids ran at 100  mL/hr and his fluid status was closely monitored with physical exams and lab  findings.   PROBLEM #2 - ACUTE-ON-CHRONIC RENAL INSUFFICIENCY:  The patient presented to  the emergency department with known renal insufficiency with a baseline  creatinine of approximately 1.5 to 2.0.  His creatinine upon admission was  4.1, which we suspected was in part due to his dehydration.  His creatinine  continually improved with gentle hydration and the patient was discharged  with a creatinine of 2.5.   PROBLEM #3 - GASTROPARESIS:  The patient's main complaint upon hospital  admission was nausea, although no vomiting; this was our main concern for  reoccurrence of dehydration due to the patient's poor p.o. intake secondary  to his nausea.  The patient was placed  on Protonix 40 mg daily and was  started on Reglan 10 mg 3 times a day before meals, which drastically  improved his symptoms.  He was diagnosed with gastroparesis secondary to his  diabetes and was instructed to continue his Reglan and his Protonix as an  outpatient.   PROBLEM #4 - SUPRATHERAPEUTIC INR:  The patient's INR upon admission was  3.6, higher than the 2-3 which is the patient's goal for anticoagulation.  It was thought to be interacting with the Cipro which the patient was taking  for his cellulitis of his lower extremity.  Cipro is known to cause  interaction with Coumadin and due to this reason, the Cipro was stopped.  He  was sent home on his regular Coumadin dose and was told to follow up closely  with his primary care physician, Dr. Sullivan Lone, as an outpatient.   PROBLEM #5 - DIABETES MELLITUS:  The patient was admitted with orthostatic  hypotension which persisted throughout his hospitalization, even though his  creatinine improved and his hydration status improved with gentle IV fluids.  Initially, it was thought his orthostasis was caused by his dehydration, but  it was later thought that his orthostasis was in fact secondary to his  diabetes and autonomic dysfunction because even when the patient's blood  pressure dropped, his pulse did not compensate by increasing in rate.  The  patient was counseled on slowing his transitions from lying to sitting and  sitting to standing, and the importance of remaining hydrated secondary to  his orthostasis.  The patient indicated his understanding of this subject  and was discharged symptom-free with no dizziness upon transitioning from  lying to sitting or sitting to standing.   PROBLEM #6 - ANEMIA:  The patient has a baseline anemia and had been anemic  since his CABG in June of this year.  The patient had iron studies done and  was found to be severely iron-deficient and was started on iron sulfate at 325 mg b.i.d.  The  patient was discharged with a hemoglobin of 9.3, which is  his baseline since being discharged from the hospital in June, and will be  monitored closely by his primary care physician.   PROBLEM #7 - CELLULITIS:  The patient was admitted with a right lower  extremity ulcer on the medial  aspect of his leg that was 2 x 1.5 cm with  purulent drainage and surrounding erythema extending about 2 cm out from the  ulcer with swelling; this was being followed by Vascular Surgery and they  were treating him for this,  The patient also underwent I&D; it was packed  with wet-to-dry packing and covered with a dry dressing.  He was initially  placed on Cipro, but due to the interaction with his Coumadin, this was  changed to Avelox 400 mg daily for 7 days.  He was discharged home with home  health for wound care and will follow up with Vascular Surgery for this  condition.   PROBLEM #8 - CORONARY ARTERY DISEASE:  The patient was found on abdominal  ultrasound to have a 4.4-cm AAA of which Vascular was aware.  He will follow  up on this with a repeat ultrasound in 6 months as he continues his routine  followup with Vascular Surgery.  This problem remained stable during his  hospital admission and will be followed up as described above.   FOLLOWUP ITEMS:  The patient was instructed to follow up with Dr. Sullivan Lone  regarding his INR and Coumadin dosing, his diabetes and his creatinine.  He  was also instructed to have a followup ultrasound with [facility] in 6 months.  The patient was instructed to transition from sitting to standing slowly so  as to equilibrate his blood pressure.      Erma Pinto   KF/MEDQ  D:  03/29/2005  T:  03/30/2005  Job:  [id number]   cc:   [facility]   [name]  [address]  [place]  [place] [address]  Fax: [phone]

## 2011-01-13 NOTE — Op Note (Signed)
Marco [name], Marco [name]               ACCOUNT NO.:  [id number]   MEDICAL RECORD NO.:  [id number]          PATIENT TYPE:  INP   LOCATION:  2312                         FACILITY:  [facility]   PHYSICIAN:  Burna Forts, M.D.DATE OF BIRTH:  12-21-1935   DATE OF PROCEDURE:  02/01/2005  DATE OF DISCHARGE:                                 OPERATIVE REPORT   INDICATIONS FOR PROCEDURE:  Mr. Stradling is a 75 year old gentleman who  presents today for coronary artery bypass grafting with known history of  some mitral regurgitation.  It has been requested by Dr. [name] that we place  the TEE probe for evaluation of cardiac stressors and function and for  evaluation of his possible mitral regurgitation.  He is brought to the  holding area the morning of surgery where, under local anesthesia with  sedation, radial arterial and pulmonary catheters are inserted without  problem.  He is then taken to the OR for routine induction of general  anesthesia, again without problems.   After satisfactory induction of general anesthesia, the TEE probe is  lubricated and protected and then passed oropharyngeally into the stomach.  It was slightly withdrawn for imaging of the cardiac structures.   PRE CARDIOPULMONARY BYPASS TEE EXAMINATION:  Left ventricle:  This is a moderately depressed left ventricular chamber and  a short axis view __________ function and contractility.  The interior wall  in short axis view appears satisfactorily contractile and thickening  appropriately as does the anterolateral aspect of the wall.  The septal and  inferior aspect appears significantly hypokinetic and in fact there is  probably a portion of the inferior wall and a short axis view of the LV  chamber that is akinetic.  Posterolateral aspect of the LV chamber and short  axis view was difficult to completely view but appeared to be hypokinetic as  well.  Long axis view again demonstrated a good to satisfactory anterior  wall  contractility and thickening and nearly akinetic aspect of that  inferior wall.   Mitral valve:  Mitral was just minimally thickened.  Mitral valve apparatus  appears to be opening satisfactorily for diastolic inflow and closing  satisfactorily during systolic contraction.  It is only minimally thickened.  There does appear to be some suggestion of decreased motion in the posterior  leaflet in the view associated with that portion of the LV chamber that was  apparently akinetic.  Doppler examination revealed multiple small jets of  mitral regurgitant flow in multiple views through the LV chamber which  suggested mild insufficiency.  This would be graded as no more than 1+.  However, during the operative procedure itself, early on, the patient  apparently became ischemic with decrease in blood pressure and significantly  worsening of his mitral regurgitant flow.  In fact, probably 2 to 3+ mitral  regurgitant flow in one view, with a jet that over rides the posterior  aspect and posterior leaflet well into the lateral and posterior aspect of  the left atrial chamber.  It went well into the chamber approximately over  half way with a good broad  based jet.  This was considered to be 3+ or  moderate but not quite severe mitral regurgitant flow at this time.   Left atrial chamber:  As previously described in terms of the mitral  regurgitant jet, minimally enlarged, no other masses noted within the  interatrial septum is interrogated, is intact.  The left atrial appendage is  seen.  There are no masses noted within.  Pulmonary veins are visualized.  Pulse width Dopplers done and there is no reversal of flow noted.   Right ventricle:  This is a somewhat enlarged right ventricular chamber,  well outlined with a moderator band noted across the ventricular chamber.  Tricuspid valve is seen.  It is mobile, compliant, excursion  opening well  during diastole and closing appropriately.  There is trace  regurgitant flow  noted.   Right atrium:  Normal right atrial chamber.  Pulmonary artery catheter is  noted within.   Patient placed on cardiopulmonary bypass.  Coronary artery bypass grafting  is carried out following by the ring annuloplasty of the mitral valve.  Patient is rewarmed after separation from cardiopulmonary bypass.   POST CARDIOPULMONARY BYPASS TEE EXAMINATION (Limited exam):  Left ventricle.  Left ventricular chamber seen in the short axis view in the  early post bypass period, again reveals the diminished contractility  especially in the septal area as it moves into the inferior aspect of the  heart.  The inferior wall in the short axis view again remains essentially  akinetic.  There is satisfactory to good overall contractility noted  anterior, anterolaterally and somewhat posteriorly.  With the addition of  inotropes, the left ventricular function itself appears improved compared to  the prebypass views.   Mitral valve:  Now seen, has the ring annuloplasty repair of the mitral  valve which has simply shrunken the actual annular dimensions significantly.  The anterior leaflet is well visualized.  Posterior leaflet is not so well  visualized in these views of course.  Doppler examination across this in  both four chamber and two chamber views reveal essentially no regurgitant  flow across the repaired mitral valve at this time.  Multiple views are  obtained and seen.  There is no regurgitant flow noted on Doppler.  This  appears to be a satisfactory repair.   The patient remained stable and was returned to the cardiac intensive care  unit in stable condition.      JTM/MEDQ  D:  02/01/2005  T:  02/01/2005  Job:  [id number]   cc:   Anesthesia Dept.

## 2011-01-13 NOTE — Assessment & Plan Note (Signed)
[facility]                         ELECTROPHYSIOLOGY OFFICE NOTE   CORDE, ANTONINI                      MRN:          [id number]  DATE:10/26/2006                            DOB:          1935/12/29    Mr. Beckley comes in today.  He has significant peripheral edema and  ongoing shortness of breath.  The last creatine that we have is 2.9 in  mid-January which was consistent with the one from November, but  aberrant from the one that was 1.9 or so in December.  His diet is not  well sodium restricted, although there is no added salts.   His medication list is notable right now for Lasix at 100 b.i.d. and  Coreg 12.5 b.i.d.  With the notes having said that, further up-titration  has not been possible.   PHYSICAL EXAMINATION:  His blood pressure is 120/77, his pulse is 77.  NECK:  Veins were 7-8.  LUNGS:  Clear.  HEART:  Sounds were regular.  EXTREMITIES:  2+ edema.   Interrogation of his [employer] pulse generator demonstrated a P  wave of 3.6.  impedance at 46 units, threshold to 0.5 to 0.4, the R wave  is 9.4, with impedance of 456 and threshold of 0.5 to 0.4.  Battery  voltage is 3.0.  He was predominately atrial paced.  Activity levels  have clearly improved since ventricular pacing was reprogrammed.  There  is still frequent ventricular ectopy.   IMPRESSION:  1. Sinus node dysfunction.  2. Ischemic heart disease, status post bypass surgery with an ejection      fraction of 25-30%.  3. Renal insufficiency--chronic.  4. Obstructive sleep apnea.  5. Diabetes.  6. Obesity.   PLAN:  1. To up titrate his Coreg to 12.5/18.75.  2. We will give him Metolazone to take once or twice a week, with      which we will have to be very careful because she did not tolerate      it last time.   I have encouraged him to pursue treatment for his obstructive sleep  apnea as this may have a modifying effect on his edema as well.  I will  check  him again, next week, in the event that we start him on his  Zaroxolyn.     Duke Salvia, MD, [facility]  Electronically Signed    SCK/MedQ  DD: 10/26/2006  DT: 10/26/2006  Job #: [id number]   cc:   Wilber Bihari. Caryn Section, M.D.

## 2011-01-13 NOTE — Assessment & Plan Note (Signed)
[facility]                            CARDIOLOGY OFFICE NOTE   Marco [name], Marco [name]                      MRN:          [id number]  DATE:10/29/2006                            DOB:          April 21, 1936    PRIMARY CARE PHYSICIAN:  [name], MD   REASON FOR PRESENTATION:  Evaluate patient with cardiomyopathy.   HISTORY OF PRESENT ILLNESS:  The patient returns for followup of his  cardiomyopathy. He was seen by Dr. [name] on Friday and had to have his  ICD evaluated. During that visit, he was noted to have weight gain with  significant lower extremity edema. Dr. [name] and I discussed it and we  did draw blood work. His BUN was 15 and his creatinine of 2.9. This was  close to his baseline. Given this, I did give him one dose or Zaroxolyn  2.5 mg that he took on Saturday. We also gave him strict instructions  about salt restriction and keeping his feet elevated. He seems to have  done this.   He returns today and says that he has lost four pounds. His legs are  much less swollen. He is not having any significant dyspnea and denies  any PND or orthopnea (Class II symptoms). He denies any chest pain.   PAST MEDICAL HISTORY:  1. Cardiomyopathy (ejection fraction of 25% to 30%).  2. Coronary artery disease status post coronary artery bypass graft in      June 2006, by Dr. [name] (Left internal mammary artery (LIMA) to the      left anterior descending artery (LAD); saphenous vein graft (SVG)      to the circumflex; saphenous vein graft (SVG) to posterolateral).  3. Mitral valve repair with a #28 mm [employer] ring and      annuloplasty.  4. Sick sinus syndrome, status post pacemaker placement by Dr. [name].  5. Abdominal aortic aneurysm.  6. Sleep apnea.  7. Chronic renal insufficiency.  8. Diabetes mellitus.  9. Hypertension.  10.Benign prostatic hypertrophy.  11.Hemorrhoids.  12.Anemia.  13.History of a deep venous thrombosis following  pacemaker placement,      treated with Coumadin therapy.  14.Cardiomyopathy with an ejection fraction of 25% to 30%.   ALLERGIES:  SHELLFISH.   MEDICATIONS:  1. Coumadin.  2. Crestor 10 mg daily.  3. Aspirin 81 mg daily.  4. Lasix 100 mg b.i.d.  5. Hydralazine 25 mg t.i.d.  6. Coreg 12.5 mg b.i.d.  7. Hectorol 2.5 mg three times a week.  8. Protonix 40 mg three times a week.  9. Docusate sodium.  10.NovoLog.  11.Isosorbide 30 mg b.i.d.   REVIEW OF SYSTEMS:  As stated in the HPI and otherwise negative for  other systems.   PHYSICAL EXAMINATION:  The patient is in no distress. Blood pressure  140/68, heart rate 80 and regular.  HEENT: Eyelids unremarkable. Pupils equal, round, and reactive to light.  Fundi not visualized. Oral mucosa is unremarkable.  NECK: No jugular venous distention, waveform within normal limits.  Carotid upstroke brisk and symmetrical. No bruits, no thyromegaly.  LYMPHATICS: No  cervical, axillary or inguinal adenopathy.  LUNGS: Clear to auscultation bilaterally.  BACK: No costovertebral angle tenderness.  CHEST: Well-healed sternotomy scar and defibrillator pocket.  HEART: PMI not displaced or sustained. S1, S2 within normal limits. No  S3. No S4. No clicks, rubs or murmurs.  ABDOMEN: Obese, positive bowel sounds, normal in frequency and pitch. No  bruits. No rebounds. No guarding. No midline pulsatile mass. No  organomegaly.  SKIN: No rashes, no nodules.  EXTREMITIES: 2+ pulses. Trace bilateral lower extremity edema.  No  cyanosis or clubbing.  NEURO: Oriented to person, place and time. Cranial nerves II-XII grossly  intact. Motor grossly intact.   ASSESSMENT/PLAN:  1. Ischemic cardiomyopathy: The patient is having Class II symptoms.      He has had much improvement in keeping his feet elevated and      avoiding salt. He had one dose of Zaroxolyn. I will check a BMET      today. Otherwise, he will go back to the regimen that he was on.  2. Coronary  disease: Having no ongoing symptoms. No further      cardiovascular testing is suggested. He will continue secondary      risk reduction.  3. Followup. I would like to see him back in two months. He is due to      see Dr. Caryn Section in about a month.     [name], MD, [facility]  Electronically Signed    JH/MedQ  DD: 10/29/2006  DT: 10/29/2006  Job #: [id number]   cc:   Wilber Bihari. Caryn Section, M.D.  Richard Sullivan Lone

## 2011-01-13 NOTE — Assessment & Plan Note (Signed)
[facility]                           ELECTROPHYSIOLOGY OFFICE NOTE   KALYAN, BARABAS                      MRN:          [id number]  DATE:05/02/2006                            DOB:          [birth year]    Marco [name] comes in today feeling a good deal better than he has  previously.  Marco [name] has a history of ischemic heart disease, status  post bypass grafting and mitral valve replacement in 2006.  He has sinus  node dysfunction and in fact has no intrinsic atrial rhythm.  He is status  post pacemaker implantation.  He also has renal insufficiency.  His most  recent creatinine was 3.4 up from 2.8; not withstanding this the patient is  feeling a good deal better.   He saw Dr. Sullivan Lone last week who very appropriately down titrated his Coreg  as his blood pressure was running about 95.  Since that time he is feeling  much better, stronger, with more energy.   MEDICATIONS:  1. Coreg 12.5 b.i.d.  2. Imdur 30 b.i.d.  3. Hydralazine 25 t.i.d.  4. Lasix 100 b.i.d.  5. NovoLog.  6. Aspirin.  7. Crestor.  8. Coumadin.   PHYSICAL EXAMINATION:  VITAL SIGNS:  His blood pressure was better at  117/69.  LUNGS:  Clear.  HEART:  Sounds were irregular at 80.  EXTREMITIES:  Without edema.   Electrocardiogram demonstrated that he was atrial paced, followed by a  ventricular paced beat that appeared to be effusion, followed by AV pacing  with a very short PR interval and then this pattern would repeat.  On  interrogation, he was programmed at 80.  He was in the A-D mode but was  failing to allow for intrinsic conduction.   We reprogrammed into the DDDR mode with maximal length in AV delays to allow  for intrinsic conduction.  He has PVCs with retrograde conduction.  These  occurred about 200 msec but he is on a V based timer and so he atrial paces  afterwards without pausing after his atrial sensed event.   We will plan to leave him programmed  thusly.  We will plan to see him again  in 6 months' time.  Because of his ventricular ectopy and the fact that his  potassium was 3.4 on Dr. Scherrie Gerlach last blood  work, I have elected to check his potassium magnesium today.  We will  forward these results to Dr. Caryn Section and Dr. Sullivan Lone upon their return.                                   Duke Salvia, MD, [facility]   SCK/MedQ  DD:  05/02/2006  DT:  05/02/2006  Job #:  [id number]   cc:   Janetta Hora. Darrick Penna, MD  Wilber Bihari. Caryn Section, M.D.  Richard Sullivan Lone

## 2011-01-13 NOTE — H&P (Signed)
NAMEIBRAHEEM, Marco [name]               ACCOUNT NO.:  [id number]   MEDICAL RECORD NO.:  [id number]          PATIENT TYPE:  INP   LOCATION:  2002                         FACILITY:  [facility]   PHYSICIAN:  [name]. [name], M.D. DATE OF BIRTH:  [birth year]   DATE OF ADMISSION:  01/31/2005  DATE OF DISCHARGE:                                HISTORY & PHYSICAL   ADMISSION DIAGNOSIS:  Coronary artery disease.   HISTORY OF PRESENT ILLNESS:  This is a 75 year old Caucasian male with  insulin-dependent diabetes mellitus, hypertension, and hyperlipidemia, who  presented with a several-day history of indigestion and waxing and waning  chest pain that was accompanied by shortness of breath, nausea, and  diaphoresis.  This began over the last weekend, and then he presented to his  primary care physician, Dr. Sullivan Lone in [place].  The patient's symptoms  were consistent with unstable angina.  He was, therefore, instructed to  present to the ED at [facility] for further evaluation.  Dr. Gwen Pounds  evaluated the patient there and noted that he was given sublingual  nitroglycerin at Dr. Elisabeth Cara office which relieved his chest pain.  EKG at  that time revealed normal sinus rhythm with a septal infarct of undetermined  age and nonspecific ST changes.  The patient was admitted and monitored.  He  was scheduled for inpatient cardiac catheterization which was performed on  January 30, 2005.  This revealed significant three-vessel coronary artery  disease, moderate to severe left ventricular dysfunction, mild mitral  regurgitation, and a small infrarenal abdominal aortic aneurysm.   The patient is also noted to have two transient episodes of right-sided  weakness, most recently approximately three weeks ago.  Carotid duplex is  pending at this time.  The patient's cardiac enzymes were also noted to be  elevated at time of admission to the ED.  Secondary to the patient's  diagnosis of acute non-Q-wave myocardial  infarction and severe three-vessel  coronary artery disease, he was referred to the CVTS service under the care  of Dr. [name] regarding surgical revascularization.   PAST MEDICAL HISTORY:  1.  Hypertension.  2.  Hyperlipidemia.  3.  Coronary artery disease status post cardiac catheterization September 27, 1994, with diagnosis of single-vessel coronary artery disease with stent      placement in the right coronary artery in 1999.  4.  BPH.  5.  Gastroesophageal reflux disease.  6.  Hiatal hernia.  7.  Status post cyst removal right wrist.  8.  Status post vasectomy in 1974.  9.  Status post skin cancer removal in 2002 and 2006.  10. Baseline renal insufficiency with a baseline creatinine of 1.4.   ALLERGIES:  No known drug allergies.  SHELLFISH.   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  He drinks alcohol on occasion and quit smoking in 1988 after  smoking approximately a pack and one-half per day.   MEDICATIONS PRIOR TO ADMISSION:  1.  Benicar, dose unknown.  2.  NovoLog 70/30 insulin, 25 units q.a.m., 20 units  q.p.m.  3.  Aspirin 81 mg daily.  4.  Vitorin, dose unknown.   REVIEW OF SYSTEMS:  GENERAL:  He is without complaint, afebrile, without  fever or chills. RESPIRATORY:  Complains of occasional seasonal allergies  with cough.  GASTROINTESTINAL:  He does have indigestion.  NEUROLOGIC:  There is question of right-sided TIA x 2.  MUSCULOSKELETAL:  Within normal  limits.  GENITOURINARY:  He does have BPH.  ENDOCRINE:  The patient does  have insulin-dependent diabetes mellitus, type 2, with CBGs occasionally  greater than 200. PSYCHIATRIC:  The patient denies depression and anxiety.  HEENT:  Within normal limits.   PHYSICAL EXAMINATION:  VITAL SIGNS:  Blood pressure 115/73, pulse 86,  respirations 20, O2 saturation 92% on 3 liters.  Weight 283 pounds.  GENERAL:  Obese white male in no acute distress.  HEENT:  Normocephalic and  atraumatic.  Pupils equal, round, and reactive to  light and accommodations.  Extraocular movements are intact.  NECK:  There is no evidence of JVD. There is a false bruit auscultated over  the left carotid.  There is no lymphadenopathy.  There is full range of  motion.  CARDIAC:  Regular rate and rhythm.  No murmurs, no rubs or gallops noted.  RESPIRATIONS:  Symmetric and unlabored.  Breath sounds are clear bilaterally  throughout the lung fields.  ABDOMEN:  Obese, soft, nontender, with active bowel sounds in four  quadrants.  There are no masses palpated.  EXTREMITIES:  There is no edema present in bilateral lower extremities.  He  has 2+ posterior tibial palpable pulse.  Dorsalis pedis is not palpable  bilaterally.  GENITOURINARY:  Deferred.  NEUROLOGIC:  Alert and oriented x 3.  Gait is not observed. Cranial nerves  grossly intact.   LABORATORY DATA:  On January 30, 2005, white count 8.7, hemoglobin 14.6,  hematocrit 43.6, platelets 200.  PT 13.3, INR 1.0, APTT 24.7.  Sodium 139,  potassium 4.5, BUN 15, creatinine 1.0, glucose 299.  Peak CK 882.  Peak CPK-  MB 52.9.  Peak troponin I 16.2.   IMPRESSION:  Three-vessel coronary artery disease status post acute non-Q-  wave myocardial infarction with moderate to severe left ventricular  dysfunction.  The patient also has mild mitral regurgitation per cardiac  catheterization.   PLAN:  The patient is tentatively scheduled for coronary artery bypass graft  surgery on the morning of February 01, 2005.  Dr. [name] has seen and evaluated  this patient and has discussed the indications, risks, and potential  benefits of surgery with the patient, and he desires to proceed as planned.  All the patient's questions have been answered.      AY/MEDQ  D:  01/31/2005  T:  01/31/2005  Job:  [id number]

## 2011-01-13 NOTE — Assessment & Plan Note (Signed)
[facility]                            CARDIOLOGY OFFICE NOTE   THAER, MIYOSHI                      MRN:          [id number]  DATE:11/19/2006                            DOB:          07-09-1936    PRIMARY:  Marco [name], M.D.   REASON FOR PRESENTATION:  Evaluate patient with cardiomyopathy.   HISTORY OF PRESENT ILLNESS:  The patient presents for follow up of the  above.  He has been doing better since I saw him and gave him a one-day  boost of Zaroxolyn.  He has been watching his fluid and salt and keeping  his legs up.  He has been watching his diet better.  He is being  followed by Dr. Darrick Penna, and has a now greater than 6 cm abdominal aortic  aneurysm.  Dr. Darrick Penna wants to operate on this once he has been cleared  from a cardiovascular standpoint and renal standpoint.   The patient has had no acute complaints.  He denies any chest pain.  He  denies any new shortness of breath (class II symptoms).  He has had no  PND or orthopnea.  He has had no palpitations or presyncope.   PAST MEDICAL HISTORY:  1. Cardiomyopathy (EF 25-30%).  2. Coronary artery disease status post CABG by Dr. [name] (June of      2006), LIMA to the LAD, SVG to circumflex, SVG to posterolateral.  3. Mitral valve repair with a #28 mm Edward Logix ring.  4. Sick sinus syndrome status post pacemaker placement by Dr. [name].  5. Abdominal aortic aneurysm.  6. Sleep apnea.  7. Chronic renal insufficiency.  8. Diabetes mellitus.  9. Hypertension.  10.Benign prostatic hypertrophy.  11.Hemorrhoids.  12.Anemia.  13.Deep vein thrombosis with chronic Coumadin therapy.  14.Status post ICD implant with a Medtronic EnRhythm.   ALLERGIES:  SHELLFISH.   MEDICATIONS:  1. Coumadin.  2. Crestor 10 mg daily.  3. Aspirin 81 mg daily.  4. NovoLog.  5. Lasix 80 mg b.i.d.  6. Hydralazine 25 mg t.i.d.  7. Coreg 12.5 mg b.i.d.  8. Hectorol 2.5 mg three times a week.  9. Protonix  40 mg three times per week.  10.Docusate sodium.  11.NovoLog.  12.Isosorbide 30 mg b.i.d.   REVIEW OF SYSTEMS:  As stated in the history of present illness, and  otherwise negative for other systems.   PHYSICAL EXAMINATION:  GENERAL:  The patient is in no distress.  VITAL SIGNS:  Weight 260 pounds, blood pressure 116/62, heart rate 62  and regular.  HEENT:  Eyes unremarkable.  Pupils equal, round and reactive to light.  Fundi not visualized.  Oral mucosa unremarkable.  NECK:  No jugular venous distention at 45 degrees.  Carotid upstroke  brisk and symmetric.  No bruits or thyromegaly.  LYMPHATICS:  No cervical, axillary or inguinal adenopathy.  LUNGS:  Clear to auscultation bilaterally.  BACK:  No costovertebral angle tenderness.  CHEST:  A well-healed sternotomy scar, well-healed ICD pocket.  HEART:  PMI not displaced or sustained.  S1 and S2 within normal limits.  No S3, no S4,  no clicks, no rubs, no murmurs.  ABDOMEN:  Obese, positive bowel sounds.  Normal in frequency and pitch.  No bruits, rebound or guarding.  No midline pulsatile mass.  No  hepatomegaly or splenomegaly.  SKIN:  No rashes, no nodules.  EXTREMITIES:  2+ pulses throughout.  No edema, cyanosis or clubbing.  NEUROLOGIC:  Oriented to person, place, and time.  Cranial nerves II-XII  grossly intact.  Motor grossly intact.   ASSESSMENT AND PLAN:  1. Cardiomyopathy.  The patient is having class 2 symptoms.  At this      point, I am going to try to maximize his beta blocker pending      abdominal aortic aneurysm repair.  I am going to increase by 3.125      mg b.i.d. added to his 12.5 b.i.d. of Coreg.  He will maintain a      current dose of diuretic and fluid and salt restrictions.  2. Coronary disease.  He has had bypass in 2006 and no symptoms since      then suggestive of recurrent angina.  No further cardiovascular      testing is suggested.  He will continue with risk reduction.  3. Abdominal aortic aneurysm  repair.  Preoperative clearance.  The      patient is at moderate risk for cardiovascular events.  In      particular, he is at risk for volume overload.  He will, of course,      need to have Medtronic called at the time of surgery to work with      his device.  However, I have given him the go-ahead for surgery, as      this needs to be repaired.  Will certainly need to follow him      closely, and, in particular, watch his fluid management carefully.      He should continue on the medications as listed preoperatively,      aside from the Coumadin.  This will need to be stopped about 4-5      days in advance.  He will be seeing Dr. Caryn Section, as well, for the renal      opinion.  4. Follow up.  I will see the patient back probably at the time of his      hospitalization for abdominal aortic aneurysm repair, or in 4      months.     [name], MD, [facility]  Electronically Signed    JH/MedQ  DD: 11/19/2006  DT: 11/19/2006  Job #: [id number]   cc:   Wilber Bihari. Caryn Section, M.D.  Janetta Hora. Darrick Penna, MD  Marco [name]

## 2011-01-18 ENCOUNTER — Encounter: Payer: Self-pay | Admitting: Cardiology

## 2011-01-19 ENCOUNTER — Encounter: Payer: Self-pay | Admitting: Cardiology

## 2011-01-24 ENCOUNTER — Encounter: Payer: Self-pay | Admitting: Cardiology

## 2011-01-24 ENCOUNTER — Ambulatory Visit (INDEPENDENT_AMBULATORY_CARE_PROVIDER_SITE_OTHER): Payer: Medicare Other | Admitting: Cardiology

## 2011-01-24 VITALS — BP 127/70 | HR 73 | Resp 16 | Ht 72.0 in | Wt 277.0 lb

## 2011-01-24 DIAGNOSIS — I428 Other cardiomyopathies: Secondary | ICD-10-CM

## 2011-01-24 DIAGNOSIS — I714 Abdominal aortic aneurysm, without rupture: Secondary | ICD-10-CM

## 2011-01-24 DIAGNOSIS — I10 Essential (primary) hypertension: Secondary | ICD-10-CM

## 2011-01-24 DIAGNOSIS — I2510 Atherosclerotic heart disease of native coronary artery without angina pectoris: Secondary | ICD-10-CM

## 2011-01-24 MED ORDER — CARVEDILOL 12.5 MG PO TABS: 12.5000 mg | ORAL_TABLET | Freq: Two times a day (BID) | ORAL | Status: DC

## 2011-01-24 NOTE — Assessment & Plan Note (Signed)
Today I will increase his carvedilol to 12.5 mg b.i.d. He will remain on other medications as listed.

## 2011-01-24 NOTE — Assessment & Plan Note (Signed)
He is actually due to have followup of his PVD at the vascular surgery office. I will defer to their management.

## 2011-01-24 NOTE — Progress Notes (Signed)
HPI The patient presents for followup of his cardiomyopathy. I am slowly trying to titrate his beta blockers. He hasn't tolerated this in the past but recently tolerated increased dose of his Coreg. He says that going up on the dose he actually has had some increased energy. He has had no new shortness of breath, presyncope or syncope. He has had no new swelling or weight gain. He has some chronic lower extremity edema. He denies any PND or orthopnea. He has had no chest pressure. We reviewed his diet and I still don't think he is compliant with suggestions I have made. He says yesterday he had hamburgers, hot dog homemade ice cream.  Allergies  Allergen Reactions  . Shellfish Allergy     Current Outpatient Prescriptions  Medication Sig Dispense Refill  . amiodarone (PACERONE) 200 MG tablet Take 1 tablet (200 mg total) by mouth daily.  30 tablet  3  . aspirin 81 MG tablet Take 81 mg by mouth daily.        [name] CALCITRIOL PO Take by mouth. 1 po daily       . carvedilol (COREG) 3.125 MG tablet Take 1 tablet (3.125 mg total) by mouth 2 (two) times daily with a meal.  60 tablet  11  . carvedilol (COREG) 6.25 MG tablet TAKE 1 TABLET TWICE A DAY  60 tablet  5  . docusate sodium (COLACE) 100 MG capsule Take 100 mg by mouth 2 (two) times daily.        . fluticasone (FLONASE) 50 MCG/ACT nasal spray Place 2 sprays into the nose daily.       . furosemide (LASIX) 80 MG tablet Take 80 mg by mouth 2 (two) times daily.        . insulin aspart (NOVOLOG) 100 UNIT/ML injection Inject into the skin. As directed       . isosorbide dinitrate (ISORDIL) 30 MG tablet TAKE 1 TABLET BY MOUTH TWICE A DAY  60 tablet  3  . omeprazole (PRILOSEC) 20 MG capsule 20 mg. 1 po 3 times a week       . potassium chloride (KLOR-CON) 10 MEQ CR tablet Take 10 mEq by mouth 2 (two) times daily.       . rosuvastatin (CRESTOR) 10 MG tablet Take 10 mg by mouth daily.        . Tamsulosin HCl (FLOMAX) 0.4 MG CAPS Take 0.4 mg by mouth. 1 po  daily       . warfarin (COUMADIN) 2 MG tablet 2 mg. As directed       . DISCONTD: hydrALAZINE (APRESOLINE) 25 MG tablet Take 12.5 mg by mouth 2 (two) times daily.          Past Medical History  Diagnosis Date  . Cardiomyopathy     EF 25-30%  . Coronary artery disease      The patient had stent to the right coronary artery in 1999.  The patient had unstable angina.  Non-Q-wave myocardial infarction in June 2006.  Coronary artery bypass graft surgery on February 01, 2005, with  mitral valve annuloplasty with placement of a 28-mm Edwards   ETlogix ring.     . Arrhythmia      Sick sinus syndrome/sinus node dysfunction after coronary   artery bypass graft/mitral valve replacement  . Mitral regurgitation   . Status post biventricular cardiac pacemaker insertion     Medtronic EnRhythm dual-chamber pacemaker implanted on February 09, 2005.   . Diabetes mellitus   .  Hypertension   . Hyperlipidemia   . BPH (benign prostatic hypertrophy)   . Renal insufficiency   . GERD (gastroesophageal reflux disease)   . CHF (congestive heart failure)     New York Heart Association class II-III   . Obesity   . DVT (deep venous thrombosis)   . Skin cancer     Past Surgical History  Procedure Date  . Coronary artery bypass graft     CABG with a  LIMA to the LAD, SVG to circumflex, SVG to posterolateral June2006.  . Mitral valve repair      mitral valve repair with a #28 Edwards logic ring  . Pacemaker placement   . Abdominal aortic aneurysm repair      May 2008,     ROS:  As stated in the HPI and negative for all other systems.  PHYSICAL EXAM BP 127/70  Pulse 73  Resp 16  Ht 6' (1.829 m)  Wt 277 lb (125.646 kg)  BMI 37.57 kg/m2PHYSICAL EXAM  HEENT: Pupils equal round and reactive, fundi not visualized, oral mucosa unremarkable  NECK: No jugular venous distention, waveform within normal limits, carotid upstroke brisk and symmetric, no bruits, right CEA scar, no thyromegaly  LYMPHATICS: No cervical,  axillary adenopathy  LUNGS: Clear to auscultation bilaterally  BACK: No CVA tenderness  CHEST: Well healed sternotomy scar.  HEART: PMI not displaced or sustained,,S1 and S2 within normal limits, no S3, no S4, no clicks, no rubs, no murmurs  ABD: Flat, positive bowel sounds normal in frequency in pitch, no bruits, no rebound, no guarding, no midline pulsatile mass, no hepatomegaly, no splenomegaly, obese, surgical scar  EXT: 2 plus pulses throughout, mild edema, no cyanosis no clubbing, chronic venous stasis changes  SKIN: No rashes no nodules  NEURO: Cranial nerves II through XII grossly intact, motor grossly intact throughout  PSYCH: Cognitively intact, oriented to person place and time   ASSESSMENT AND PLAN

## 2011-01-24 NOTE — Patient Instructions (Signed)
Increase Carvedilol to 12.5 mg twice daily Continue other medications the same Follow up in 2 months with Dr Antoine Poche

## 2011-01-24 NOTE — Assessment & Plan Note (Signed)
His blood pressure actually runs low but is tolerating his beta blocker increased.

## 2011-01-24 NOTE — Assessment & Plan Note (Signed)
The patient understands the need to lose weight with diet and exercise. We have discussed specific strategies for this.  

## 2011-02-02 ENCOUNTER — Other Ambulatory Visit (INDEPENDENT_AMBULATORY_CARE_PROVIDER_SITE_OTHER): Payer: Medicare Other

## 2011-02-02 DIAGNOSIS — Z48812 Encounter for surgical aftercare following surgery on the circulatory system: Secondary | ICD-10-CM

## 2011-02-02 DIAGNOSIS — I6529 Occlusion and stenosis of unspecified carotid artery: Secondary | ICD-10-CM

## 2011-02-14 ENCOUNTER — Encounter: Payer: Self-pay | Admitting: Cardiology

## 2011-02-17 NOTE — Procedures (Unsigned)
CAROTID DUPLEX EXAM  INDICATION:  Right carotid endarterectomy.  HISTORY: Diabetes:  Yes. Cardiac:  CAD, CABG. Hypertension:  Yes. Smoking:  Previous. Previous Surgery:  Right carotid endarterectomy on 12/15/2008. CV History:  Currently asymptomatic. Amaurosis Fugax No, Paresthesias No, Hemiparesis No                                      RIGHT             LEFT Brachial systolic pressure:         132               136 Brachial Doppler waveforms:         Normal            Normal Vertebral direction of flow:        Antegrade         Antegrade DUPLEX VELOCITIES (cm/sec) CCA peak systolic                   65                94 ECA peak systolic                   81                191 ICA peak systolic                   161               P=74/M=253 ICA end diastolic                   55                P=50/M=77 PLAQUE MORPHOLOGY:                  Mixed             Mixed PLAQUE AMOUNT:                      Mild              Moderate PLAQUE LOCATION:                    CCA               CCA/ECA/proximal ICA  IMPRESSION: 1. Patent right carotid endarterectomy site with an elevated velocity     noted in the right mid internal carotid artery which appears to be     possibly due to a change in vessel diameter. 2. Doppler velocity suggests a high end 60%-79% stenosis of the left     mid internal carotid artery, however, this appears to be due to a     kink in the vessel. 3. No significant change noted in Doppler velocities when compared to     the previous exam on 07/14/2010.  ___________________________________________ Janetta Hora. Fields, MD  [facility]/MEDQ  D:  02/03/2011  T:  02/03/2011  Job:  [id number]

## 2011-03-21 ENCOUNTER — Encounter: Payer: Self-pay | Admitting: Cardiology

## 2011-03-21 ENCOUNTER — Ambulatory Visit (INDEPENDENT_AMBULATORY_CARE_PROVIDER_SITE_OTHER): Payer: Medicare Other | Admitting: Cardiology

## 2011-03-21 DIAGNOSIS — I499 Cardiac arrhythmia, unspecified: Secondary | ICD-10-CM

## 2011-03-21 DIAGNOSIS — Z951 Presence of aortocoronary bypass graft: Secondary | ICD-10-CM

## 2011-03-21 DIAGNOSIS — I428 Other cardiomyopathies: Secondary | ICD-10-CM

## 2011-03-21 DIAGNOSIS — I714 Abdominal aortic aneurysm, without rupture: Secondary | ICD-10-CM

## 2011-03-21 NOTE — Patient Instructions (Signed)
Follow up with Dr Hochrein in 4 months  The current medical regimen is effective;  continue present plan and medications.  

## 2011-03-21 NOTE — Assessment & Plan Note (Signed)
He is extracardiac vascular disease he is followed by the vascular surgery group. I will defer to their management.

## 2011-03-21 NOTE — Progress Notes (Signed)
HPI The patient presents for followup of his cardiomyopathy. I am slowly trying to titrate his beta blockers. He hasn't tolerated this in the past but recently tolerated increased dose of his Coreg. At the last visit I did increase his beta blocker and he tolerated this.  The patient denies any new symptoms such as chest discomfort, neck or arm discomfort. There has been no new shortness of breath, PND or orthopnea. There have been no reported palpitations, presyncope or syncope.  He has been on the treadmill for 5 minutes most days which is an increase.  He is dieting although he has gained weight on our scales.  Allergies  Allergen Reactions  . Shellfish Allergy     Current Outpatient Prescriptions  Medication Sig Dispense Refill  . amiodarone (PACERONE) 200 MG tablet Take 1 tablet (200 mg total) by mouth daily.  30 tablet  3  . aspirin 81 MG tablet Take 81 mg by mouth daily.        [name] CALCITRIOL PO Take by mouth. 1 po daily       . carvedilol (COREG) 12.5 MG tablet Take 1 tablet (12.5 mg total) by mouth 2 (two) times daily.  60 tablet  11  . docusate sodium (COLACE) 100 MG capsule Take 100 mg by mouth 2 (two) times daily.        . fluticasone (FLONASE) 50 MCG/ACT nasal spray Place 2 sprays into the nose daily.       . furosemide (LASIX) 80 MG tablet Take 80 mg by mouth 2 (two) times daily.        . insulin aspart (NOVOLOG) 100 UNIT/ML injection Inject into the skin. As directed       . isosorbide dinitrate (ISORDIL) 30 MG tablet TAKE 1 TABLET BY MOUTH TWICE A DAY  60 tablet  3  . omeprazole (PRILOSEC) 20 MG capsule 20 mg. 1 po 3 times a week       . potassium chloride (KLOR-CON) 10 MEQ CR tablet Take 20 mEq by mouth 2 (two) times daily.       . rosuvastatin (CRESTOR) 10 MG tablet Take 10 mg by mouth daily.        . Tamsulosin HCl (FLOMAX) 0.4 MG CAPS Take 0.4 mg by mouth. 1 po daily       . warfarin (COUMADIN) 2 MG tablet 2 mg. As directed         Past Medical History  Diagnosis Date  .  Cardiomyopathy     EF 40%  . Coronary artery disease      The patient had stent to the right coronary artery in 1999.  The patient had unstable angina.  Non-Q-wave myocardial infarction in June 2006.  Coronary artery bypass graft surgery on February 01, 2005, with  mitral valve annuloplasty with placement of a 28-mm Edwards   ETlogix ring.     . Arrhythmia      Sick sinus syndrome/sinus node dysfunction after coronary   artery bypass graft/mitral valve replacement  . Mitral regurgitation   . Status post biventricular cardiac pacemaker insertion     Medtronic EnRhythm dual-chamber pacemaker implanted on February 09, 2005.   . Diabetes mellitus   . Hypertension   . Hyperlipidemia   . BPH (benign prostatic hypertrophy)   . Renal insufficiency   . GERD (gastroesophageal reflux disease)   . CHF (congestive heart failure)     New York Heart Association class II-III   . Obesity   . DVT (  deep venous thrombosis)   . Skin cancer   . Anemia   . Sleep apnea     Not tolerant of CPAP    Past Surgical History  Procedure Date  . Coronary artery bypass graft     CABG with a  LIMA to the LAD, SVG to circumflex, SVG to posterolateral June2006.  . Mitral valve repair      mitral valve repair with a #28 Edwards logic ring  . Pacemaker placement   . Abdominal aortic aneurysm repair      May 2008,     ROS:  As stated in the HPI and negative for all other systems.  PHYSICAL EXAM BP 128/64  Pulse 66  Resp 67  Ht 6' (1.829 m)  Wt 283 lb (128.368 kg)  BMI 38.38 kg/m2PHYSICAL EXAM  HEENT: Pupils equal round and reactive, fundi not visualized, oral mucosa unremarkable  NECK: No jugular venous distention, waveform within normal limits, carotid upstroke brisk and symmetric, no bruits, right CEA scar, no thyromegaly  LYMPHATICS: No cervical, axillary adenopathy  LUNGS: Clear to auscultation bilaterally  BACK: No CVA tenderness  CHEST: Well healed sternotomy scar and device scar HEART: PMI not displaced or  sustained,,S1 and S2 within normal limits, no S3, no S4, no clicks, no rubs, no murmurs  ABD: Flat, positive bowel sounds normal in frequency in pitch, no bruits, no rebound, no guarding, no midline pulsatile mass, no hepatomegaly, no splenomegaly, obese, surgical scar  EXT: 2 plus pulses throughout, mild edema, no cyanosis no clubbing, chronic venous stasis changes  SKIN: No rashes no nodules  NEURO: Cranial nerves II through XII grossly intact, motor grossly intact throughout  PSYCH: Cognitively intact, oriented to person place and time   EKG:  Sinus rhythm, rate 67, axis within normal limits, poor anterior R-wave progression, low voltage, nonspecific T-wave flattening  ASSESSMENT AND PLAN

## 2011-03-21 NOTE — Assessment & Plan Note (Signed)
He tolerates amiodarone and Coumadin and is up-to-date with lab followup.

## 2011-03-21 NOTE — Assessment & Plan Note (Signed)
He seems to be euvolemic. Since he is so sensitive to meds hydration I will not go up at this visit but I will try again when I see him back.

## 2011-03-21 NOTE — Assessment & Plan Note (Signed)
The patient has no new sypmtoms.  No further cardiovascular testing is indicated.  We will continue with aggressive risk reduction and meds as listed.  

## 2011-03-21 NOTE — Assessment & Plan Note (Signed)
The patient understands the need to lose weight with diet and exercise. We have discussed specific strategies for this.  

## 2011-03-23 ENCOUNTER — Ambulatory Visit (INDEPENDENT_AMBULATORY_CARE_PROVIDER_SITE_OTHER): Payer: Medicare Other | Admitting: *Deleted

## 2011-03-23 ENCOUNTER — Other Ambulatory Visit: Payer: Self-pay

## 2011-03-23 DIAGNOSIS — I495 Sick sinus syndrome: Secondary | ICD-10-CM

## 2011-03-23 DIAGNOSIS — I428 Other cardiomyopathies: Secondary | ICD-10-CM

## 2011-03-23 DIAGNOSIS — I472 Ventricular tachycardia: Secondary | ICD-10-CM

## 2011-03-23 DIAGNOSIS — Z950 Presence of cardiac pacemaker: Secondary | ICD-10-CM

## 2011-03-23 DIAGNOSIS — I499 Cardiac arrhythmia, unspecified: Secondary | ICD-10-CM

## 2011-03-24 LAB — REMOTE PACEMAKER DEVICE
AL AMPLITUDE: 3.092 mv
AL IMPEDENCE PM: 504 Ohm
BAMS-0001: 170 {beats}/min
BATTERY VOLTAGE: 2.97 V
RV LEAD AMPLITUDE: 19.6648 mv
RV LEAD IMPEDENCE PM: 424 Ohm

## 2011-03-28 NOTE — Progress Notes (Signed)
Pacer remote check  

## 2011-04-08 ENCOUNTER — Other Ambulatory Visit: Payer: Self-pay | Admitting: Internal Medicine

## 2011-04-11 ENCOUNTER — Encounter: Payer: Self-pay | Admitting: *Deleted

## 2011-04-24 ENCOUNTER — Observation Stay: Payer: Self-pay | Admitting: Internal Medicine

## 2011-04-24 DIAGNOSIS — I951 Orthostatic hypotension: Secondary | ICD-10-CM

## 2011-04-24 DIAGNOSIS — R55 Syncope and collapse: Secondary | ICD-10-CM

## 2011-05-12 ENCOUNTER — Ambulatory Visit (INDEPENDENT_AMBULATORY_CARE_PROVIDER_SITE_OTHER): Payer: Medicare Other | Admitting: Cardiology

## 2011-05-12 ENCOUNTER — Encounter: Payer: Self-pay | Admitting: Cardiology

## 2011-05-12 VITALS — BP 176/84 | HR 62 | Ht 72.0 in | Wt 282.0 lb

## 2011-05-12 DIAGNOSIS — I10 Essential (primary) hypertension: Secondary | ICD-10-CM

## 2011-05-12 DIAGNOSIS — N189 Chronic kidney disease, unspecified: Secondary | ICD-10-CM

## 2011-05-12 DIAGNOSIS — I428 Other cardiomyopathies: Secondary | ICD-10-CM

## 2011-05-12 DIAGNOSIS — Z951 Presence of aortocoronary bypass graft: Secondary | ICD-10-CM

## 2011-05-12 DIAGNOSIS — I5022 Chronic systolic (congestive) heart failure: Secondary | ICD-10-CM

## 2011-05-12 NOTE — Assessment & Plan Note (Signed)
The patient has no new sypmtoms.  No further cardiovascular testing is indicated.  We will continue with aggressive risk reduction and meds as listed.  

## 2011-05-12 NOTE — Patient Instructions (Signed)
Please increase Lasix to 80 mg twice daily for 2 days then resume 40 mg twice a day. Take extra 20 mEq of potassium for 2 days. Continue all other medications as listed. Follow up as scheduled

## 2011-05-12 NOTE — Assessment & Plan Note (Signed)
We had a long discussion about volume management.  He had started to increase his PO volume intake but also thought he needed more diuretic.  I reviewed this with him.  He is to restrict to 1500 cc and if he is hypovolemic in the future we will reduce the diuretic.  He will take a slightly higher diuretic dose for 2 days.  He will then go back to the lower dose.  Other meds will remain as listed.

## 2011-05-12 NOTE — Assessment & Plan Note (Signed)
I will obtain the last BMET from Dr. Caryn Section for our records.

## 2011-05-12 NOTE — Assessment & Plan Note (Signed)
His blood pressure is elevated but he says that it has been in the 130s systolic at home.  He will keep a BP diary.  I will not change the meds at this time.

## 2011-05-12 NOTE — Progress Notes (Signed)
HPI The patient presents for followup of his cardiomyopathy. Since I last saw him he was hospitalized at [facility] for apparent dehydration.  He reports that he had been dieting and restricting his fluids to try to lose weight but hasn't reduced his diuretic. He apparently became dizzy. He had a fall injuring his left knee. He was apparently hypotensive. He did have his dose of diuretic reduced. He has since seen Dr. Caryn Section who says that his creatinine is stable.  The patient is doing PT and he denies any new symptoms such as chest discomfort, neck or arm discomfort. There has been no new shortness of breath, PND or orthopnea. There have been no reported palpitations, presyncope or syncope.  Allergies  Allergen Reactions  . Shellfish Allergy     Current Outpatient Prescriptions  Medication Sig Dispense Refill  . amiodarone (PACERONE) 200 MG tablet Take 1 tablet (200 mg total) by mouth daily.  30 tablet  3  . aspirin 81 MG tablet Take 81 mg by mouth daily.        [name] CALCITRIOL PO Take by mouth. 1 po daily       . carvedilol (COREG) 12.5 MG tablet Take 1 tablet (12.5 mg total) by mouth 2 (two) times daily.  60 tablet  11  . docusate sodium (COLACE) 100 MG capsule Take 100 mg by mouth 2 (two) times daily.        . fluticasone (FLONASE) 50 MCG/ACT nasal spray Place 2 sprays into the nose daily.       . furosemide (LASIX) 40 MG tablet Take 40 mg by mouth 2 (two) times daily.        . insulin aspart (NOVOLOG) 100 UNIT/ML injection Inject into the skin. As directed       . isosorbide dinitrate (ISORDIL) 30 MG tablet TAKE 1 TABLET BY MOUTH TWICE A DAY  60 tablet  3  . omeprazole (PRILOSEC) 20 MG capsule 20 mg. 1 po 3 times a week       . potassium chloride (KLOR-CON) 10 MEQ CR tablet Take 20 mEq by mouth 2 (two) times daily.       . rosuvastatin (CRESTOR) 10 MG tablet Take 10 mg by mouth daily.        . Tamsulosin HCl (FLOMAX) 0.4 MG CAPS Take 0.4 mg by mouth. 1 po daily       . warfarin (COUMADIN) 2 MG tablet  2 mg. As directed         Past Medical History  Diagnosis Date  . Cardiomyopathy     EF 40%  . Coronary artery disease      The patient had stent to the right coronary artery in 1999.  The patient had unstable angina.  Non-Q-wave myocardial infarction in June 2006.  Coronary artery bypass graft surgery on February 01, 2005, with  mitral valve annuloplasty with placement of a 28-mm Edwards   ETlogix ring.     . Arrhythmia      Sick sinus syndrome/sinus node dysfunction after coronary   artery bypass graft/mitral valve replacement  . Mitral regurgitation   . Status post biventricular cardiac pacemaker insertion     Medtronic EnRhythm dual-chamber pacemaker implanted on February 09, 2005.   . Diabetes mellitus   . Hypertension   . Hyperlipidemia   . BPH (benign prostatic hypertrophy)   . Renal insufficiency   . GERD (gastroesophageal reflux disease)   . CHF (congestive heart failure)     New York Heart  Association class II-III   . Obesity   . DVT (deep venous thrombosis)   . Skin cancer   . Anemia   . Sleep apnea     Not tolerant of CPAP    Past Surgical History  Procedure Date  . Coronary artery bypass graft     CABG with a  LIMA to the LAD, SVG to circumflex, SVG to posterolateral June2006.  . Mitral valve repair      mitral valve repair with a #28 Edwards logic ring  . Pacemaker placement   . Abdominal aortic aneurysm repair      May 2008,     ROS:  As stated in the HPI and negative for all other systems.  PHYSICAL EXAM BP 176/84  Pulse 62  Ht 6' (1.829 m)  Wt 282 lb (127.914 kg)  BMI 38.25 kg/m2PHYSICAL EXAM  HEENT: Pupils equal round and reactive, fundi not visualized, oral mucosa unremarkable  NECK: No jugular venous distention, waveform within normal limits, carotid upstroke brisk and symmetric, no bruits, right CEA scar, no thyromegaly  LYMPHATICS: No cervical, axillary adenopathy  LUNGS: Clear to auscultation bilaterally  BACK: No CVA tenderness  CHEST: Well healed  sternotomy scar and device scar HEART: PMI not displaced or sustained,,S1 and S2 within normal limits, no S3, no S4, no clicks, no rubs, no murmurs  ABD: Flat, positive bowel sounds normal in frequency in pitch, no bruits, no rebound, no guarding, no midline pulsatile mass, no hepatomegaly, no splenomegaly, obese, surgical scar  EXT: 2 plus pulses throughout, mild edema, no cyanosis no clubbing, chronic venous stasis changes  SKIN: No rashes no nodules  NEURO: Cranial nerves II through XII grossly intact, motor grossly intact throughout  PSYCH: Cognitively intact, oriented to person place and time   EKG:  Sinus rhythm, rate 62, axis within normal limits, poor anterior R-wave progression, low voltage, nonspecific T-wave flattening  ASSESSMENT AND PLAN

## 2011-05-18 LAB — RENAL FUNCTION PANEL
BUN: 38 — ABNORMAL HIGH
CO2: 28
Calcium: 8.8
Glucose, Bld: 160 — ABNORMAL HIGH
Sodium: 140

## 2011-05-18 LAB — HEMOGLOBIN AND HEMATOCRIT, BLOOD
HCT: 37 — ABNORMAL LOW
Hemoglobin: 12.1 — ABNORMAL LOW

## 2011-05-18 LAB — IRON AND TIBC
Saturation Ratios: 16 — ABNORMAL LOW
UIBC: 209

## 2011-05-18 LAB — FERRITIN: Ferritin: 137 (ref 22–322)

## 2011-05-19 LAB — POCT HEMOGLOBIN-HEMACUE: Operator id: 274481

## 2011-05-22 LAB — IRON AND TIBC
Saturation Ratios: 19 — ABNORMAL LOW
TIBC: 225
UIBC: 183

## 2011-05-22 LAB — POCT HEMOGLOBIN-HEMACUE: Operator id: 206361

## 2011-05-23 LAB — IRON AND TIBC
Saturation Ratios: 17 — ABNORMAL LOW
UIBC: 162

## 2011-05-23 LAB — HEMOGLOBIN AND HEMATOCRIT, BLOOD
HCT: 37.7 — ABNORMAL LOW
Hemoglobin: 12.5 — ABNORMAL LOW

## 2011-05-25 LAB — RENAL FUNCTION PANEL
BUN: 34 — ABNORMAL HIGH
CO2: 29
Chloride: 103
Glucose, Bld: 165 — ABNORMAL HIGH
Potassium: 4.6

## 2011-05-25 LAB — IRON AND TIBC
Saturation Ratios: 20
TIBC: 217

## 2011-05-25 LAB — CBC
Hemoglobin: 12.3 — ABNORMAL LOW
Platelets: 227
RDW: 17 — ABNORMAL HIGH

## 2011-05-26 LAB — CBC
Hemoglobin: 12.4 — ABNORMAL LOW
MCHC: 32.3
MCV: 86.9
RBC: 4.43

## 2011-06-02 ENCOUNTER — Encounter: Payer: Self-pay | Admitting: Cardiology

## 2011-06-02 LAB — CBC
MCHC: 32.3
MCV: 81.5
Platelets: 260
RBC: 4.38

## 2011-06-02 LAB — IRON AND TIBC: Iron: 38 — ABNORMAL LOW

## 2011-06-13 ENCOUNTER — Encounter (HOSPITAL_COMMUNITY): Payer: Medicare Other | Attending: Nephrology

## 2011-06-13 DIAGNOSIS — D509 Iron deficiency anemia, unspecified: Secondary | ICD-10-CM | POA: Insufficient documentation

## 2011-06-20 ENCOUNTER — Encounter (HOSPITAL_COMMUNITY): Payer: Medicare Other

## 2011-06-23 ENCOUNTER — Encounter: Payer: Self-pay | Admitting: Internal Medicine

## 2011-06-23 ENCOUNTER — Ambulatory Visit (INDEPENDENT_AMBULATORY_CARE_PROVIDER_SITE_OTHER): Payer: Medicare Other | Admitting: Internal Medicine

## 2011-06-23 VITALS — BP 120/66 | HR 60 | Resp 18 | Ht 72.0 in | Wt 284.0 lb

## 2011-06-23 DIAGNOSIS — I495 Sick sinus syndrome: Secondary | ICD-10-CM

## 2011-06-23 DIAGNOSIS — I10 Essential (primary) hypertension: Secondary | ICD-10-CM

## 2011-06-23 DIAGNOSIS — N189 Chronic kidney disease, unspecified: Secondary | ICD-10-CM

## 2011-06-23 LAB — PACEMAKER DEVICE OBSERVATION
AL IMPEDENCE PM: 536 Ohm
AL THRESHOLD: 0.5 V
ATRIAL PACING PM: 99.96
BAMS-0001: 170 {beats}/min

## 2011-06-23 MED ORDER — AMIODARONE HCL 100 MG PO TABS: 100.0000 mg | ORAL_TABLET | Freq: Every day | ORAL | Status: DC

## 2011-06-23 NOTE — Assessment & Plan Note (Signed)
Stable No change required today  Would not expect venous stasis changes to improve with more aggressive diuresis Support stockings advised

## 2011-06-23 NOTE — Progress Notes (Signed)
The patient presents today for annual electrophysiology followup.  Since last being seen in our clinic, the patient reports doing reasonably well.  He has noticed labile blood pressures.  He has stable edema and is followed by Dr Caryn Section for renal failure chronically.  Today, he denies symptoms of palpitations, chest pain, shortness of breath, orthopnea, PND, dizziness, presyncope, syncope, or neurologic sequela.  His PVCs have not recently been an issue.  The patient feels that he is tolerating medications without difficulties and is otherwise without complaint today.   Past Medical History  Diagnosis Date  . Cardiomyopathy     EF 40%  . Coronary artery disease      The patient had stent to the right coronary artery in 1999.  The patient had unstable angina.  Non-Q-wave myocardial infarction in June 2006.  Coronary artery bypass graft surgery on February 01, 2005, with  mitral valve annuloplasty with placement of a 28-mm Edwards   ETlogix ring.     . Arrhythmia      Sick sinus syndrome/sinus node dysfunction after coronary   artery bypass graft/mitral valve replacement  . Mitral regurgitation   . Status post biventricular cardiac pacemaker insertion     Medtronic EnRhythm dual-chamber pacemaker implanted on February 09, 2005.   . Diabetes mellitus   . Hypertension   . Hyperlipidemia   . BPH (benign prostatic hypertrophy)   . Renal insufficiency   . GERD (gastroesophageal reflux disease)   . CHF (congestive heart failure)     New York Heart Association class II-III   . Obesity   . DVT (deep venous thrombosis)   . Skin cancer   . Anemia   . Sleep apnea     Not tolerant of CPAP   Past Surgical History  Procedure Date  . Coronary artery bypass graft     CABG with a  LIMA to the LAD, SVG to circumflex, SVG to posterolateral June2006.  . Mitral valve repair      mitral valve repair with a #28 Edwards logic ring  . Pacemaker placement   . Abdominal aortic aneurysm repair      May 2008,      Current Outpatient Prescriptions  Medication Sig Dispense Refill  . amiodarone (PACERONE) 200 MG tablet Take 1 tablet (200 mg total) by mouth daily.  30 tablet  3  . aspirin 81 MG tablet Take 81 mg by mouth daily.        [name] CALCITRIOL PO Take by mouth. 1 po daily       . carvedilol (COREG) 12.5 MG tablet Take 1 tablet (12.5 mg total) by mouth 2 (two) times daily.  60 tablet  11  . docusate sodium (COLACE) 100 MG capsule Take 100 mg by mouth 2 (two) times daily.        . fluticasone (FLONASE) 50 MCG/ACT nasal spray Place 2 sprays into the nose daily.       . furosemide (LASIX) 40 MG tablet Take 40 mg by mouth 2 (two) times daily.        . insulin aspart (NOVOLOG) 100 UNIT/ML injection Inject into the skin. As directed       . isosorbide dinitrate (ISORDIL) 30 MG tablet TAKE 1 TABLET BY MOUTH TWICE A DAY  60 tablet  3  . omeprazole (PRILOSEC) 20 MG capsule 20 mg. 1 po 3 times a week       . potassium chloride (KLOR-CON) 10 MEQ CR tablet Take 20 mEq by mouth  2 (two) times daily.       . rosuvastatin (CRESTOR) 10 MG tablet Take 10 mg by mouth daily.        . Tamsulosin HCl (FLOMAX) 0.4 MG CAPS Take 0.4 mg by mouth. 1 po daily       . warfarin (COUMADIN) 2 MG tablet 2 mg. As directed         Allergies  Allergen Reactions  . Shellfish Allergy     History   Social History  . Marital Status: Married    Spouse Name: N/A    Number of Children: N/A  . Years of Education: N/A   Occupational History  . Retired     Emergency planning/management officer in [place]  .     Social History Main Topics  . Smoking status: Former Smoker -- 1 years    Quit date: 08/28/1986  . Smokeless tobacco: Not on file  . Alcohol Use: Yes     Occasionally but none since his surgery in June  . Drug Use: No  . Sexually Active: Not on file   Other Topics Concern  . Not on file   Social History Narrative  . No narrative on file    Family History  Problem Relation Age of Onset  . Heart attack Mother 22  . Diabetes  Sister   . Heart attack Brother     3 Brothers deceased from MI in 56's or 54's  . Heart attack Father 61    Physical Exam: Filed Vitals:   06/23/11 1035  BP: 120/66  Pulse: 60  Resp: 18  Height: 6' (1.829 m)  Weight: 284 lb (128.822 kg)    GEN- The patient is well appearing, alert and oriented x 3 today.   Head- normocephalic, atraumatic Eyes-  Sclera clear, conjunctiva pink Ears- hearing intact Oropharynx- clear Neck- supple, no JVP Lymph- no cervical lymphadenopathy Lungs- Clear to ausculation bilaterally, normal work of breathing Chest- pacemaker pocket is well healed Heart- Regular rate and rhythm, no murmurs, rubs or gallops, PMI not laterally displaced GI- soft, NT, ND, + BS Extremities- no clubbing, cyanosis, 2+ stable edema with venous stasis changes  Pacemaker interrogation- reviewed in detail today,  See PACEART report  Assessment and Plan:

## 2011-06-23 NOTE — Assessment & Plan Note (Signed)
Normal pacemaker function See Pace Art report No changes today  

## 2011-06-23 NOTE — Assessment & Plan Note (Signed)
PVCs much improved (< 0.8pvcs per hour) No NSVT by PPM interrogatoin  Decrease amiodarone to 100mg  daily Consider stopping amiodarone if pvcs no not return in the future

## 2011-06-23 NOTE — Patient Instructions (Signed)
Your physician wants you to follow-up in: 12 months. You will receive a reminder letter in the mail two months in advance. If you don't receive a letter, please call our office to schedule the follow-up appointment.  Your physician has recommended you make the following change in your medication: Decrease amiodarone to 100 mg by mouth daily.  Send Carelink transmission on September 21, 2011

## 2011-06-23 NOTE — Assessment & Plan Note (Signed)
2 gram sodium diet Avoid NSAIDs, decongestants, etc  Follow-up with Dr Caryn Section, Antoine Poche, and Sullivan Lone

## 2011-07-25 ENCOUNTER — Encounter: Payer: Self-pay | Admitting: Cardiology

## 2011-07-25 ENCOUNTER — Ambulatory Visit (INDEPENDENT_AMBULATORY_CARE_PROVIDER_SITE_OTHER): Payer: Medicare Other | Admitting: Cardiology

## 2011-07-25 DIAGNOSIS — I495 Sick sinus syndrome: Secondary | ICD-10-CM

## 2011-07-25 DIAGNOSIS — I2510 Atherosclerotic heart disease of native coronary artery without angina pectoris: Secondary | ICD-10-CM

## 2011-07-25 DIAGNOSIS — I428 Other cardiomyopathies: Secondary | ICD-10-CM

## 2011-07-25 DIAGNOSIS — Z79899 Other long term (current) drug therapy: Secondary | ICD-10-CM

## 2011-07-25 MED ORDER — CARVEDILOL 6.25 MG PO TABS: 6.2500 mg | ORAL_TABLET | Freq: Two times a day (BID) | ORAL | Status: DC

## 2011-07-25 NOTE — Progress Notes (Signed)
HPI The patient presents for followup of his cardiomyopathy. Since I last saw him he has done well.  He has had none of the presyncope, bradycardia or hypotension that he had before.  He's not had any lightheadedness. He's had no new shortness of breath, PND orthopnea. She's had no chest pressure, neck or arm discomfort. He has no new weight gain but unfortunately no weight loss. He's had no new edema.  Allergies  Allergen Reactions  . Shellfish Allergy     Current Outpatient Prescriptions  Medication Sig Dispense Refill  . amiodarone (PACERONE) 100 MG tablet Take 1 tablet (100 mg total) by mouth daily.  30 tablet  11  . aspirin 81 MG tablet Take 81 mg by mouth daily.        [name] CALCITRIOL PO Take by mouth. 1 po daily       . carvedilol (COREG) 12.5 MG tablet Take 1 tablet (12.5 mg total) by mouth 2 (two) times daily.  60 tablet  11  . docusate sodium (COLACE) 100 MG capsule Take 100 mg by mouth 2 (two) times daily.        . fluticasone (FLONASE) 50 MCG/ACT nasal spray Place 2 sprays into the nose daily.       . furosemide (LASIX) 40 MG tablet Take 40 mg by mouth 2 (two) times daily.        . insulin aspart (NOVOLOG) 100 UNIT/ML injection Inject into the skin. As directed       . isosorbide dinitrate (ISORDIL) 30 MG tablet TAKE 1 TABLET BY MOUTH TWICE A DAY  60 tablet  3  . omeprazole (PRILOSEC) 20 MG capsule 20 mg. 1 po 3 times a week       . potassium chloride (KLOR-CON) 10 MEQ CR tablet Take 20 mEq by mouth 2 (two) times daily.       . rosuvastatin (CRESTOR) 10 MG tablet Take 10 mg by mouth daily.        . Tamsulosin HCl (FLOMAX) 0.4 MG CAPS Take 0.4 mg by mouth. 1 po daily       . warfarin (COUMADIN) 2 MG tablet 2 mg. As directed         Past Medical History  Diagnosis Date  . Cardiomyopathy     EF 40%  . Coronary artery disease      The patient had stent to the right coronary artery in 1999.  The patient had unstable angina.  Non-Q-wave myocardial infarction in June 2006.  Coronary  artery bypass graft surgery on February 01, 2005, with  mitral valve annuloplasty with placement of a 28-mm Edwards   ETlogix ring.     . Arrhythmia      Sick sinus syndrome/sinus node dysfunction after coronary   artery bypass graft/mitral valve replacement  . Mitral regurgitation   . Status post biventricular cardiac pacemaker insertion     Medtronic EnRhythm dual-chamber pacemaker implanted on February 09, 2005.   . Diabetes mellitus   . Hypertension   . Hyperlipidemia   . BPH (benign prostatic hypertrophy)   . Renal insufficiency   . GERD (gastroesophageal reflux disease)   . CHF (congestive heart failure)     New York Heart Association class II-III   . Obesity   . DVT (deep venous thrombosis)   . Skin cancer   . Anemia   . Sleep apnea     Not tolerant of CPAP    Past Surgical History  Procedure Date  . Coronary  artery bypass graft     CABG with a  LIMA to the LAD, SVG to circumflex, SVG to posterolateral June2006.  . Mitral valve repair      mitral valve repair with a #28 Edwards logic ring  . Pacemaker placement   . Abdominal aortic aneurysm repair      May 2008,     ROS:  As stated in the HPI and negative for all other systems.  PHYSICAL EXAM BP 115/63  Pulse 79  Resp 18  Ht 6' (1.829 m)  Wt 288 lb 1.9 oz (130.69 kg)  BMI 39.08 kg/m2PHYSICAL EXAM  HEENT: Pupils equal round and reactive, fundi not visualized, oral mucosa unremarkable  NECK: No jugular venous distention, waveform within normal limits, carotid upstroke brisk and symmetric, no bruits, right CEA scar, no thyromegaly  LYMPHATICS: No cervical, axillary adenopathy  LUNGS: Clear to auscultation bilaterally  BACK: No CVA tenderness  CHEST: Well healed sternotomy scar and device scar HEART: PMI not displaced or sustained,,S1 and S2 within normal limits, no S3, no S4, no clicks, no rubs, no murmurs  ABD: Flat, positive bowel sounds normal in frequency in pitch, no bruits, no rebound, no guarding, no midline  pulsatile mass, no hepatomegaly, no splenomegaly, obese, surgical scar  EXT: 2 plus pulses upper diminished lower, mild edema, no cyanosis no clubbing, chronic venous stasis changes  SKIN: No rashes no nodules  NEURO: Cranial nerves II through XII grossly intact, motor grossly intact throughout  PSYCH: Cognitively intact, oriented to person place and time   ASSESSMENT AND PLAN

## 2011-07-25 NOTE — Patient Instructions (Addendum)
Follow up in 6 months with Dr Antoine Poche.  You will receive a letter in the mail 2 months before you are due.  Please call us when you receive this letter to schedule your follow up appointment.  The current medical regimen is effective;  continue present plan and medications.   Please have your lab work drawn at [facility] in [place] ([facility], Hepatic panel)

## 2011-07-25 NOTE — Assessment & Plan Note (Signed)
The patient has no new sypmtoms.  No further cardiovascular testing is indicated.  We will continue with aggressive risk reduction and meds as listed.  

## 2011-07-25 NOTE — Assessment & Plan Note (Signed)
He will not tolerate further med titration.  No change in therapy is indicated.

## 2011-07-25 NOTE — Assessment & Plan Note (Signed)
He and I again discussed the need to lose weight.  He would need to eat less and move more.

## 2011-08-09 ENCOUNTER — Other Ambulatory Visit: Payer: Self-pay | Admitting: Cardiology

## 2011-08-14 ENCOUNTER — Encounter: Payer: Self-pay | Admitting: Cardiology

## 2011-08-20 ENCOUNTER — Inpatient Hospital Stay: Payer: Self-pay | Admitting: Internal Medicine

## 2011-08-20 DIAGNOSIS — I369 Nonrheumatic tricuspid valve disorder, unspecified: Secondary | ICD-10-CM

## 2011-08-20 DIAGNOSIS — R0602 Shortness of breath: Secondary | ICD-10-CM

## 2011-08-20 DIAGNOSIS — M6281 Muscle weakness (generalized): Secondary | ICD-10-CM

## 2011-08-20 DIAGNOSIS — R5081 Fever presenting with conditions classified elsewhere: Secondary | ICD-10-CM

## 2011-08-20 DIAGNOSIS — R0601 Orthopnea: Secondary | ICD-10-CM

## 2011-08-21 DIAGNOSIS — I509 Heart failure, unspecified: Secondary | ICD-10-CM

## 2011-09-21 ENCOUNTER — Ambulatory Visit (INDEPENDENT_AMBULATORY_CARE_PROVIDER_SITE_OTHER): Payer: Medicare Other | Admitting: *Deleted

## 2011-09-21 ENCOUNTER — Encounter: Payer: Self-pay | Admitting: Internal Medicine

## 2011-09-21 DIAGNOSIS — I472 Ventricular tachycardia: Secondary | ICD-10-CM

## 2011-09-21 DIAGNOSIS — I499 Cardiac arrhythmia, unspecified: Secondary | ICD-10-CM

## 2011-09-21 DIAGNOSIS — I428 Other cardiomyopathies: Secondary | ICD-10-CM

## 2011-09-24 LAB — REMOTE PACEMAKER DEVICE
AL AMPLITUDE: 2.7 mv
AL IMPEDENCE PM: 512 Ohm
BAMS-0001: 170 {beats}/min
BATTERY VOLTAGE: 2.96 V
RV LEAD AMPLITUDE: 10 mv

## 2011-09-26 ENCOUNTER — Encounter: Payer: Self-pay | Admitting: *Deleted

## 2011-09-26 NOTE — Progress Notes (Signed)
Remote pacer check  

## 2011-09-29 ENCOUNTER — Ambulatory Visit: Payer: Self-pay | Admitting: Family Medicine

## 2011-12-18 ENCOUNTER — Other Ambulatory Visit: Payer: Self-pay | Admitting: Cardiology

## 2011-12-19 NOTE — Telephone Encounter (Signed)
..   Requested Prescriptions   Pending Prescriptions Disp Refills  . isosorbide dinitrate (ISORDIL) 30 MG tablet [Pharmacy Med Name: ISOSORBIDE DN 30MG  ORAL TABLET] 60 tablet 6    Sig: TAKE 1 TABLET BY MOUTH TWICE A DAY

## 2011-12-21 ENCOUNTER — Ambulatory Visit (INDEPENDENT_AMBULATORY_CARE_PROVIDER_SITE_OTHER): Payer: Medicare Other | Admitting: *Deleted

## 2011-12-21 ENCOUNTER — Encounter: Payer: Self-pay | Admitting: Internal Medicine

## 2011-12-21 DIAGNOSIS — I472 Ventricular tachycardia: Secondary | ICD-10-CM

## 2011-12-21 DIAGNOSIS — I499 Cardiac arrhythmia, unspecified: Secondary | ICD-10-CM

## 2011-12-21 DIAGNOSIS — I428 Other cardiomyopathies: Secondary | ICD-10-CM

## 2011-12-25 LAB — REMOTE PACEMAKER DEVICE
AL AMPLITUDE: 2.9 mv
AL IMPEDENCE PM: 560 Ohm
ATRIAL PACING PM: 99.92
BATTERY VOLTAGE: 2.96 V
RV LEAD IMPEDENCE PM: 432 Ohm
VENTRICULAR PACING PM: 0.03

## 2011-12-26 NOTE — Progress Notes (Signed)
Remote pacer check  

## 2012-01-02 ENCOUNTER — Encounter: Payer: Self-pay | Admitting: Cardiology

## 2012-01-02 ENCOUNTER — Ambulatory Visit (INDEPENDENT_AMBULATORY_CARE_PROVIDER_SITE_OTHER): Payer: Medicare Other | Admitting: Cardiology

## 2012-01-02 VITALS — BP 110/65 | HR 66 | Ht 72.0 in | Wt 273.4 lb

## 2012-01-02 DIAGNOSIS — I428 Other cardiomyopathies: Secondary | ICD-10-CM

## 2012-01-02 DIAGNOSIS — I2510 Atherosclerotic heart disease of native coronary artery without angina pectoris: Secondary | ICD-10-CM

## 2012-01-02 DIAGNOSIS — E785 Hyperlipidemia, unspecified: Secondary | ICD-10-CM

## 2012-01-02 DIAGNOSIS — Z951 Presence of aortocoronary bypass graft: Secondary | ICD-10-CM

## 2012-01-02 DIAGNOSIS — I10 Essential (primary) hypertension: Secondary | ICD-10-CM

## 2012-01-02 DIAGNOSIS — I714 Abdominal aortic aneurysm, without rupture: Secondary | ICD-10-CM

## 2012-01-02 NOTE — Assessment & Plan Note (Signed)
This and his carotid stenosis are followed by Vascular surgery.

## 2012-01-02 NOTE — Assessment & Plan Note (Signed)
He seems to be euvolemic.  At this point, no change in therapy is indicated.  We have reviewed salt and fluid restrictions.  No further cardiovascular testing is indicated.   

## 2012-01-02 NOTE — Assessment & Plan Note (Signed)
He will send me the most recent labs with an LDL goal less than 100.

## 2012-01-02 NOTE — Patient Instructions (Signed)
The current medical regimen is effective;  continue present plan and medications.  Follow up in 6 months with Dr Hochrein.  You will receive a letter in the mail 2 months before you are due.  Please call us when you receive this letter to schedule your follow up appointment.  

## 2012-01-02 NOTE — Assessment & Plan Note (Signed)
The patient has no new sypmtoms.  No further cardiovascular testing is indicated.  We will continue with aggressive risk reduction and meds as listed.  

## 2012-01-02 NOTE — Assessment & Plan Note (Signed)
The blood pressure is at target. No change in medications is indicated. We will continue with therapeutic lifestyle changes (TLC). This is being managed in the context of treating his CHF 

## 2012-01-02 NOTE — Progress Notes (Signed)
HPI The patient presents for followup of his cardiomyopathy. He was hospitalized in December at [facility] with pneumonia and flu. Since then he's not been exercising as much. He had lost quite a bit of weight but with some of his back on. He has no acute complaints. He has dyspnea with exertion which is chronic. He denies any ongoing chest discomfort, neck or arm discomfort. He has no new palpitations, presyncope or syncope. He is slightly "wobbly". However, he's had no falls.  Allergies  Allergen Reactions  . Shellfish Allergy     Current Outpatient Prescriptions  Medication Sig Dispense Refill  . amiodarone (PACERONE) 100 MG tablet Take 1 tablet (100 mg total) by mouth daily.  30 tablet  11  . aspirin 81 MG tablet Take 81 mg by mouth daily.        [name] CALCITRIOL PO Take by mouth. 1 po daily       . carvedilol (COREG) 6.25 MG tablet Take 1 tablet (6.25 mg total) by mouth 2 (two) times daily.  60 tablet  11  . docusate sodium (COLACE) 100 MG capsule Take 100 mg by mouth 2 (two) times daily.        . fluticasone (FLONASE) 50 MCG/ACT nasal spray Place 2 sprays into the nose daily.       . furosemide (LASIX) 40 MG tablet Take 40 mg by mouth 2 (two) times daily.        . insulin aspart (NOVOLOG) 100 UNIT/ML injection Inject into the skin. As directed       . isosorbide dinitrate (ISORDIL) 30 MG tablet TAKE 1 TABLET BY MOUTH TWICE A DAY  60 tablet  6  . omeprazole (PRILOSEC) 20 MG capsule 20 mg. 1 po 3 times a week       . rosuvastatin (CRESTOR) 10 MG tablet Take 10 mg by mouth daily.        . Tamsulosin HCl (FLOMAX) 0.4 MG CAPS Take 0.4 mg by mouth. 1 po daily       . warfarin (COUMADIN) 2 MG tablet 2 mg. As directed         Past Medical History  Diagnosis Date  . Cardiomyopathy     EF 40%  . Coronary artery disease      The patient had stent to the right coronary artery in 1999.  The patient had unstable angina.  Non-Q-wave myocardial infarction in June 2006.  Coronary artery bypass graft  surgery on February 01, 2005, with  mitral valve annuloplasty with placement of a 28-mm Edwards   ETlogix ring.     . Arrhythmia      Sick sinus syndrome/sinus node dysfunction after coronary   artery bypass graft/mitral valve replacement  . Mitral regurgitation   . Status post biventricular cardiac pacemaker insertion     Medtronic EnRhythm dual-chamber pacemaker implanted on February 09, 2005.   . Diabetes mellitus   . Hypertension   . Hyperlipidemia   . BPH (benign prostatic hypertrophy)   . Renal insufficiency   . GERD (gastroesophageal reflux disease)   . CHF (congestive heart failure)     New York Heart Association class II-III   . Obesity   . DVT (deep venous thrombosis)   . Skin cancer   . Anemia   . Sleep apnea     Not tolerant of CPAP    Past Surgical History  Procedure Date  . Coronary artery bypass graft     CABG with a  LIMA  to the LAD, SVG to circumflex, SVG to posterolateral June2006.  . Mitral valve repair      mitral valve repair with a #28 Edwards logic ring  . Pacemaker placement   . Abdominal aortic aneurysm repair      May 2008,     ROS:  As stated in the HPI and negative for all other systems.  PHYSICAL EXAM BP 110/65  Pulse 66  Ht 6' (1.829 m)  Wt 273 lb 6.4 oz (124.013 kg)  BMI 37.08 kg/m2PHYSICAL EXAM  HEENT: Pupils equal round and reactive, fundi not visualized, oral mucosa unremarkable  NECK: No jugular venous distention, waveform within normal limits, carotid upstroke brisk and symmetric, no bruits, right CEA scar, no thyromegaly  LUNGS: Clear to auscultation bilaterally  BACK: No CVA tenderness  CHEST: Well healed sternotomy scar and device scar HEART: PMI not displaced or sustained,,S1 and S2 within normal limits, no S3, no S4, no clicks, no rubs, no murmurs  ABD: Flat, positive bowel sounds normal in frequency in pitch, no bruits, no rebound, no guarding, no midline pulsatile mass, no hepatomegaly, no splenomegaly, obese, surgical scar  EXT: 2  plus pulses upper diminished lower, mild edema, no cyanosis no clubbing, chronic venous stasis changes    EKG: Atrial paced rhythm, rate 66, no acute ST T wave changes. 01/02/2012  ASSESSMENT AND PLAN

## 2012-01-08 ENCOUNTER — Encounter: Payer: Self-pay | Admitting: *Deleted

## 2012-02-07 ENCOUNTER — Encounter: Payer: Self-pay | Admitting: Neurosurgery

## 2012-02-08 ENCOUNTER — Ambulatory Visit (INDEPENDENT_AMBULATORY_CARE_PROVIDER_SITE_OTHER): Payer: Medicare Other | Admitting: *Deleted

## 2012-02-08 ENCOUNTER — Ambulatory Visit (INDEPENDENT_AMBULATORY_CARE_PROVIDER_SITE_OTHER): Payer: Medicare Other | Admitting: Neurosurgery

## 2012-02-08 ENCOUNTER — Encounter: Payer: Self-pay | Admitting: Neurosurgery

## 2012-02-08 VITALS — BP 176/101 | HR 61 | Resp 16 | Ht 72.0 in | Wt 266.0 lb

## 2012-02-08 DIAGNOSIS — Z48812 Encounter for surgical aftercare following surgery on the circulatory system: Secondary | ICD-10-CM

## 2012-02-08 DIAGNOSIS — I6529 Occlusion and stenosis of unspecified carotid artery: Secondary | ICD-10-CM | POA: Insufficient documentation

## 2012-02-08 NOTE — Progress Notes (Signed)
[facility] [place] HISTORY AND PHYSICAL   CC: Annual carotid duplex Referring Physician: Fields  History of Present Illness: 76 year old male patient of Dr. fields seen for known carotid stenosis. He is status post a right CEA in 2010 has done well since that time. The patient reports no signs or symptoms of CVA, TIA, amaurosis fugax or any neural deficit. Patient denies any new medical diagnoses or any recent surgeries  Past Medical History  Diagnosis Date  . Cardiomyopathy     EF 40%  . Coronary artery disease      The patient had stent to the right coronary artery in 1999.  The patient had unstable angina.  Non-Q-wave myocardial infarction in June 2006.  Coronary artery bypass graft surgery on February 01, 2005, with  mitral valve annuloplasty with placement of a 28-mm Edwards   ETlogix ring.     . Arrhythmia      Sick sinus syndrome/sinus node dysfunction after coronary   artery bypass graft/mitral valve replacement  . Mitral regurgitation   . Status post biventricular cardiac pacemaker insertion     Medtronic EnRhythm dual-chamber pacemaker implanted on February 09, 2005.   . Diabetes mellitus   . Hypertension   . Hyperlipidemia   . BPH (benign prostatic hypertrophy)   . Renal insufficiency   . GERD (gastroesophageal reflux disease)   . CHF (congestive heart failure)     New York Heart Association class II-III   . Obesity   . DVT (deep venous thrombosis)   . Anemia   . Sleep apnea     Not tolerant of CPAP  . Skin cancer 2013    [name] x 2 back and left neck    ROS: [x]  Positive   [ ]  Denies    General: [ ]  Weight loss, [ ]  Fever, [ ]  chills Neurologic: [ ]  Dizziness, [ ]  Blackouts, [ ]  Seizure [ ]  Stroke, [ ]  "Mini stroke", [ ]  Slurred speech, [ ]  Temporary blindness; [ ]  weakness in arms or legs, [ ]  Hoarseness Cardiac: [ ]  Chest pain/pressure, [ ]  Shortness of breath at rest [ ]  Shortness of breath with exertion, [ ]  Atrial fibrillation or  irregular heartbeat Vascular: [ ]  Pain in legs with walking, [ ]  Pain in legs at rest, [ ]  Pain in legs at night,  [ ]  Non-healing ulcer, [ ]  Blood clot in vein/DVT,   Pulmonary: [ ]  Home oxygen, [ ]  Productive cough, [ ]  Coughing up blood, [ ]  Asthma,  [ ]  Wheezing Musculoskeletal:  [ ]  Arthritis, [ ]  Low back pain, [ ]  Joint pain Hematologic: [ ]  Easy Bruising, [ ]  Anemia; [ ]  Hepatitis Gastrointestinal: [ ]  Blood in stool, [ ]  Gastroesophageal Reflux/heartburn, [ ]  Trouble swallowing Urinary: [ ]  chronic Kidney disease, [ ]  on HD - [ ]  MWF or [ ]  TTHS, [ ]  Burning with urination, [ ]  Difficulty urinating Skin: [ ]  Rashes, [ ]  Wounds Psychological: [ ]  Anxiety, [ ]  Depression   Social History History  Substance Use Topics  . Smoking status: Former Smoker -- 1 years    Quit date: 08/28/1986  . Smokeless tobacco: Not on file  . Alcohol Use: Yes     Occasionally but none since his surgery in June    Family History Family History  Problem Relation Age of Onset  . Heart attack Mother 53  . Diabetes Sister   . Heart disease Sister   . Heart attack  Brother     3 Brothers deceased from MI in 54's or 74's  . Heart disease Brother     Heart Disease before age 23  . Heart attack Father 48  . Diabetes Father     Amputation: bilateral feet  . Heart disease Father     Allergies  Allergen Reactions  . Shellfish Allergy     Current Outpatient Prescriptions  Medication Sig Dispense Refill  . amiodarone (PACERONE) 100 MG tablet Take 1 tablet (100 mg total) by mouth daily.  30 tablet  11  . aspirin 81 MG tablet Take 81 mg by mouth daily.        [name] azelastine (ASTELIN) 137 MCG/SPRAY nasal spray Inhale 137 mcg into the lungs at bedtime. 2 puffs at bedtime      . CALCITRIOL PO Take by mouth. 1 po daily       . carvedilol (COREG) 6.25 MG tablet Take 1 tablet (6.25 mg total) by mouth 2 (two) times daily.  60 tablet  11  . docusate sodium (COLACE) 100 MG capsule Take 100 mg by mouth 2  (two) times daily.        . fluticasone (FLONASE) 50 MCG/ACT nasal spray Place 2 sprays into the nose daily.       . furosemide (LASIX) 40 MG tablet Take 40 mg by mouth 2 (two) times daily.        . insulin aspart (NOVOLOG) 100 UNIT/ML injection Inject into the skin. As directed       . isosorbide dinitrate (ISORDIL) 30 MG tablet TAKE 1 TABLET BY MOUTH TWICE A DAY  60 tablet  6  . NOVOLOG MIX 70/30 (70-30) 100 UNIT/ML injection Inject into the skin 2 (two) times daily at 10 AM and 5 PM.      . omeprazole (PRILOSEC) 20 MG capsule 20 mg. 1 po 3 times a week       . PATADAY 0.2 % SOLN Apply 0.2 mLs topically as needed.      . predniSONE (DELTASONE) 10 MG tablet Take 10 mg by mouth as needed.      . rosuvastatin (CRESTOR) 10 MG tablet Take 10 mg by mouth daily.        . Tamsulosin HCl (FLOMAX) 0.4 MG CAPS Take 0.4 mg by mouth. 1 po daily       . warfarin (COUMADIN) 2 MG tablet 2 mg. As directed         Physical Examination  Filed Vitals:   02/08/12 1533  BP: 176/101  Pulse: 61  Resp:     Body mass index is 36.08 kg/(m^2).  General:  WDWN in NAD Gait: Normal HEENT: WNL Eyes: Pupils equal Pulmonary: normal non-labored breathing , without Rales, rhonchi,  wheezing Cardiac: RRR, without  Murmurs, rubs or gallops; Abdomen: soft, NT, no masses Skin: no rashes, ulcers noted  Vascular Exam Pulses: 3+ radial pulses bilaterally Carotid bruits: Carotid pulses to auscultation no bruits are heard Extremities without ischemic changes, no Gangrene , no cellulitis; no open wounds;  Musculoskeletal: no muscle wasting or atrophy   Neurologic: A&O X 3; Appropriate Affect ; SENSATION: normal; MOTOR FUNCTION:  moving all extremities equally. Speech is fluent/normal  Non-Invasive Vascular Imaging CAROTID DUPLEX 02/08/2012  Right ICA 0 - 19% stenosis Left ICA 40 - 59 % stenosis   ASSESSMENT/PLAN: Stable and asymptomatic mild left carotid stenosis, the patient knows the signs and symptoms of CVA  and knows to report to the nearest emergency room should that  occur. The patient's questions were encouraged and answered, he will followup here in one year with repeat carotid duplex and be seen in my clinic.  Lauree Chandler ANP   Clinic MD: Darrick Penna

## 2012-02-16 NOTE — Procedures (Unsigned)
CAROTID DUPLEX EXAM  INDICATION:  Carotid endarterectomy.  HISTORY: Diabetes:  Yes Cardiac:  CAD, CABG Hypertension:  Yes Smoking:  Previous. Previous Surgery:  Right carotid endarterectomy on 12/07/2008. CV History:  Currently asymptomatic. Amaurosis Fugax No, Paresthesias No, Hemiparesis No                                      RIGHT             LEFT Brachial systolic pressure:         158               160 Brachial Doppler waveforms:         Normal            Normal Vertebral direction of flow:        Antegrade         Antegrade DUPLEX VELOCITIES (cm/sec) CCA peak systolic                   44                64 ECA peak systolic                   145               91 ICA peak systolic                   83                153 ICA end diastolic                   29                43 PLAQUE MORPHOLOGY:                  Mixed             Calcific PLAQUE AMOUNT:                      Mild              Moderate PLAQUE LOCATION:                    CCA               CCA/ECA/ICA  IMPRESSION:  Patent right carotid endarterectomy site with no right ICA stenosis. Doppler velocities of the left proximal mid internal carotid artery suggests 40% to 59% stenosis. Velocities of the bilateral internal carotid arteries appear less than previously recorded when compared to the previous exam of 02/02/2011.  ___________________________________________ Janetta Hora. Fields, MD  CH/MEDQ  D:  02/12/2012  T:  02/12/2012  Job:  [id number]

## 2012-02-17 ENCOUNTER — Other Ambulatory Visit: Payer: Self-pay | Admitting: Cardiology

## 2012-02-19 ENCOUNTER — Telehealth: Payer: Self-pay | Admitting: Cardiology

## 2012-02-19 ENCOUNTER — Other Ambulatory Visit: Payer: Self-pay | Admitting: Cardiology

## 2012-02-19 NOTE — Telephone Encounter (Signed)
New problem:    Called in needing the patient's carvedilol (COREG) 6.25 MG tablet called in because they did not receive the fax we possibly sent.

## 2012-03-11 ENCOUNTER — Telehealth: Payer: Self-pay | Admitting: *Deleted

## 2012-03-11 ENCOUNTER — Inpatient Hospital Stay: Payer: Self-pay | Admitting: Internal Medicine

## 2012-03-11 LAB — CBC
HGB: 13 g/dL (ref 13.0–18.0)
MCH: 29.8 pg (ref 26.0–34.0)
MCHC: 32.7 g/dL (ref 32.0–36.0)
MCV: 91 fL (ref 80–100)
Platelet: 213 10*3/uL (ref 150–440)
RBC: 4.37 10*6/uL — ABNORMAL LOW (ref 4.40–5.90)
RDW: 15.3 % — ABNORMAL HIGH (ref 11.5–14.5)

## 2012-03-11 LAB — BASIC METABOLIC PANEL WITH GFR
Anion Gap: 6 — ABNORMAL LOW
BUN: 49 mg/dL — ABNORMAL HIGH
Calcium, Total: 9.1 mg/dL
Chloride: 104 mmol/L
Co2: 33 mmol/L — ABNORMAL HIGH
Creatinine: 2.94 mg/dL — ABNORMAL HIGH
EGFR (African American): 23 — ABNORMAL LOW
EGFR (Non-African Amer.): 20 — ABNORMAL LOW
Glucose: 127 mg/dL — ABNORMAL HIGH
Osmolality: 300
Potassium: 3.9 mmol/L
Sodium: 143 mmol/L

## 2012-03-11 LAB — TROPONIN I: Troponin-I: <0.02 ng/mL

## 2012-03-11 LAB — PROTIME-INR: Prothrombin Time: 18.6 secs — ABNORMAL HIGH (ref 11.5–14.7)

## 2012-03-11 LAB — CK TOTAL AND CKMB (NOT AT ARMC)
CK, Total: 68 U/L (ref 35–232)
CK-MB: 0.7 ng/mL (ref 0.5–3.6)

## 2012-03-11 NOTE — Telephone Encounter (Signed)
Dr. [name] received a DOD call from Dr. Willeen Cass regarding the patient's fluid status. Patient with crackles in lung fields and excessive edema. Per Dr. [name], recommendations are to send the patient to the ER for further evaluation.

## 2012-03-12 DIAGNOSIS — I5041 Acute combined systolic (congestive) and diastolic (congestive) heart failure: Secondary | ICD-10-CM

## 2012-03-12 DIAGNOSIS — I369 Nonrheumatic tricuspid valve disorder, unspecified: Secondary | ICD-10-CM

## 2012-03-12 LAB — BASIC METABOLIC PANEL
Anion Gap: 6 — ABNORMAL LOW (ref 7–16)
BUN: 49 mg/dL — ABNORMAL HIGH (ref 7–18)
Calcium, Total: 8.7 mg/dL (ref 8.5–10.1)
Co2: 33 mmol/L — ABNORMAL HIGH (ref 21–32)
EGFR (Non-African Amer.): 20 — ABNORMAL LOW
Glucose: 124 mg/dL — ABNORMAL HIGH (ref 65–99)
Osmolality: 298 (ref 275–301)
Potassium: 3.7 mmol/L (ref 3.5–5.1)

## 2012-03-13 LAB — BASIC METABOLIC PANEL
BUN: 45 mg/dL — ABNORMAL HIGH (ref 7–18)
Calcium, Total: 9.1 mg/dL (ref 8.5–10.1)
Chloride: 101 mmol/L (ref 98–107)
Co2: 38 mmol/L — ABNORMAL HIGH (ref 21–32)
EGFR (African American): 25 — ABNORMAL LOW
EGFR (Non-African Amer.): 21 — ABNORMAL LOW
Glucose: 147 mg/dL — ABNORMAL HIGH (ref 65–99)
Osmolality: 299 (ref 275–301)
Potassium: 4.8 mmol/L (ref 3.5–5.1)
Sodium: 143 mmol/L (ref 136–145)

## 2012-03-13 LAB — PROTIME-INR
INR: 1.5
Prothrombin Time: 18.9 secs — ABNORMAL HIGH (ref 11.5–14.7)

## 2012-03-14 ENCOUNTER — Encounter: Payer: Self-pay | Admitting: Cardiovascular Disease

## 2012-03-18 ENCOUNTER — Ambulatory Visit (INDEPENDENT_AMBULATORY_CARE_PROVIDER_SITE_OTHER): Payer: Medicare Other | Admitting: Cardiovascular Disease

## 2012-03-18 ENCOUNTER — Encounter: Payer: Self-pay | Admitting: Cardiovascular Disease

## 2012-03-18 VITALS — BP 120/70 | HR 63 | Ht 72.0 in | Wt 267.0 lb

## 2012-03-18 DIAGNOSIS — N189 Chronic kidney disease, unspecified: Secondary | ICD-10-CM

## 2012-03-18 DIAGNOSIS — I428 Other cardiomyopathies: Secondary | ICD-10-CM

## 2012-03-18 DIAGNOSIS — I2789 Other specified pulmonary heart diseases: Secondary | ICD-10-CM

## 2012-03-18 DIAGNOSIS — Z951 Presence of aortocoronary bypass graft: Secondary | ICD-10-CM

## 2012-03-18 DIAGNOSIS — I10 Essential (primary) hypertension: Secondary | ICD-10-CM

## 2012-03-18 DIAGNOSIS — I272 Pulmonary hypertension, unspecified: Secondary | ICD-10-CM

## 2012-03-18 DIAGNOSIS — I2510 Atherosclerotic heart disease of native coronary artery without angina pectoris: Secondary | ICD-10-CM

## 2012-03-18 NOTE — Assessment & Plan Note (Signed)
Moderate pulmonary hypertension seen recently by echocardiogram. Lasix increased to 80 mg twice a day. He will cut back on his oral fluid intake. We have suggested he could back down on the Lasix to 80 mg in the morning, 40 mg in the afternoon if his weight stays stable. He has improved symptoms on oxygen.

## 2012-03-18 NOTE — Assessment & Plan Note (Signed)
Managed by Dr. Caryn Section. Stable while in the hospital. Creatinine 2.8-3 even with aggressive diuresis.

## 2012-03-18 NOTE — Progress Notes (Signed)
Patient ID: Marco [name], male    DOB: [birth year], 76 y.o.   MRN: [id number]  HPI Comments: Marco [name] is a very pleasant 76 year old gentleman with a history of coronary artery disease, CABG, atrial fibrillation and DVT, on warfarin, diabetes, previous smoking history PVD, carotid endarterectomy, sick sinus syndrome who recently presented to the hospital at [facility] after being seen by Dr. Willeen Cass of ear nose throat with increasing shortness of breath, oxygen saturations to the mid 80s on room air. Cardiology was consulted for systolic and diastolic CHF.   He reported increasing shortness of breath for at least one week, worsening lower external edema, PND, orthopnea. He had been drinking protein shakes and other drinks in an effort to lose weight for a wedding in September. He was treated with Lasix IV. Discharged on Lasix 80 mg twice a day after improvement of his symptoms. INR in the hospital was 1.5 Creatinine in the hospital ranged from 2.8-3, BNP 1200.  He has LIMA to the LAD, vein graft to the circumflex, vein graft to the PL in June 2006 Also has mitral valve her care with #28 Edwards logic ring Pacemaker placement AAA repair May 2008  Echocardiogram in the hospital 03/12/2012 showed moderate right ventricular pressures 50-60 mmHg consistent with moderate pulmonary hypertension, ejection fraction 30-35%, moderately dilated left atrium, dilated left ventricle.  EKG shows normal sinus rhythm with rate 63 beats per minute with poor R-wave progression through the anterior precordial leads   Outpatient Encounter Prescriptions as of 03/18/2012  Medication Sig Dispense Refill  . amiodarone (PACERONE) 100 MG tablet Take 1 tablet (100 mg total) by mouth daily.  30 tablet  11  . aspirin 81 MG tablet Take 81 mg by mouth daily.        [name] azelastine (ASTELIN) 137 MCG/SPRAY nasal spray Inhale 137 mcg into the lungs at bedtime. 2 puffs at bedtime      . CALCITRIOL PO Take 0.5 mcg by mouth daily. 1 po  daily      . carvedilol (COREG) 6.25 MG tablet Take 1 tablet (6.25 mg total) by mouth 2 (two) times daily.  60 tablet  11  . docusate sodium (COLACE) 100 MG capsule Take 100 mg by mouth 2 (two) times daily.        . furosemide (LASIX) 40 MG tablet Take 40 mg by mouth 2 (two) times daily.        . insulin aspart (NOVOLOG) 100 UNIT/ML injection Inject into the skin. As directed       . isosorbide dinitrate (ISORDIL) 30 MG tablet TAKE 1 TABLET BY MOUTH TWICE A DAY  60 tablet  6  . NOVOLOG MIX 70/30 (70-30) 100 UNIT/ML injection Inject into the skin. As directed.      [name] omeprazole (PRILOSEC) 20 MG capsule Taking 3 times a week.      [name] PATADAY 0.2 % SOLN Apply 0.2 mLs topically as needed.      . rosuvastatin (CRESTOR) 10 MG tablet Take 10 mg by mouth daily.        . Tamsulosin HCl (FLOMAX) 0.4 MG CAPS Take 0.4 mg by mouth. 1 po daily       . warfarin (COUMADIN) 2 MG tablet 2 mg. As directed         Review of Systems  Constitutional: Negative.   HENT: Negative.   Eyes: Negative.   Respiratory: Positive for shortness of breath.   Cardiovascular: Positive for leg swelling.  Gastrointestinal: Negative.   Musculoskeletal:  Negative.   Skin: Negative.   Neurological: Negative.   Hematological: Negative.   Psychiatric/Behavioral: Negative.   All other systems reviewed and are negative.    BP 120/70  Pulse 63  Ht 6' (1.829 m)  Wt 267 lb (121.11 kg)  BMI 36.21 kg/m2  Physical Exam  Nursing note and vitals reviewed. Constitutional: He is oriented to person, place, and time. He appears well-developed and well-nourished.       On nasal cannula oxygen  HENT:  Head: Normocephalic.  Nose: Nose normal.  Mouth/Throat: Oropharynx is clear and moist.  Eyes: Conjunctivae are normal. Pupils are equal, round, and reactive to light.  Neck: Normal range of motion. Neck supple. No JVD present.  Cardiovascular: Normal rate, regular rhythm, S1 normal, S2 normal, normal heart sounds and intact distal  pulses.  Exam reveals no gallop and no friction rub.   No murmur heard. Pulmonary/Chest: Effort normal and breath sounds normal. No respiratory distress. He has no wheezes. He has no rales. He exhibits no tenderness.  Abdominal: Soft. Bowel sounds are normal. He exhibits no distension. There is no tenderness.  Musculoskeletal: Normal range of motion. He exhibits edema. He exhibits no tenderness.  Lymphadenopathy:    He has no cervical adenopathy.  Neurological: He is alert and oriented to person, place, and time. Coordination normal.  Skin: Skin is warm and dry. No rash noted. No erythema.  Psychiatric: He has a normal mood and affect. His behavior is normal. Judgment and thought content normal.           Assessment and Plan

## 2012-03-18 NOTE — Assessment & Plan Note (Signed)
Blood pressure is well controlled on today's visit. No changes made to the medications. 

## 2012-03-18 NOTE — Assessment & Plan Note (Signed)
Left ventricle is mildly dilated prior to previous echocardiogram. Right eye pressure is higher. We'll continue aggressive diuresis. Significant systolic and diastolic dysfunction.

## 2012-03-18 NOTE — Patient Instructions (Addendum)
You are doing well. Please continue lasix 80 mg in the morning and at least 40 mg in the afternoon Cut back on your fluids  Please call us if you have new issues that need to be addressed before your next appt.  Your physician wants you to follow-up in: 3 months.  You will receive a reminder letter in the mail two months in advance. If you don't receive a letter, please call our office to schedule the follow-up appointment.

## 2012-03-18 NOTE — Assessment & Plan Note (Signed)
Currently with no symptoms of angina. No further workup at this time. Continue current medication regimen. 

## 2012-03-27 ENCOUNTER — Encounter: Payer: Self-pay | Admitting: Internal Medicine

## 2012-03-28 ENCOUNTER — Ambulatory Visit (INDEPENDENT_AMBULATORY_CARE_PROVIDER_SITE_OTHER): Payer: Medicare Other | Admitting: *Deleted

## 2012-03-28 DIAGNOSIS — I499 Cardiac arrhythmia, unspecified: Secondary | ICD-10-CM

## 2012-04-02 LAB — REMOTE PACEMAKER DEVICE
ATRIAL PACING PM: 99.92
BAMS-0001: 170 {beats}/min
RV LEAD IMPEDENCE PM: 424 Ohm

## 2012-04-17 ENCOUNTER — Encounter: Payer: Self-pay | Admitting: *Deleted

## 2012-04-22 ENCOUNTER — Ambulatory Visit: Payer: Self-pay | Admitting: Family Medicine

## 2012-04-30 ENCOUNTER — Ambulatory Visit: Payer: Self-pay | Admitting: Family Medicine

## 2012-05-28 ENCOUNTER — Ambulatory Visit: Payer: Self-pay | Admitting: Ophthalmology

## 2012-05-28 LAB — POTASSIUM: Potassium: 4.1 mmol/L (ref 3.5–5.1)

## 2012-06-10 ENCOUNTER — Ambulatory Visit: Payer: Self-pay | Admitting: Ophthalmology

## 2012-06-19 ENCOUNTER — Encounter: Payer: Self-pay | Admitting: Cardiovascular Disease

## 2012-06-19 ENCOUNTER — Ambulatory Visit (INDEPENDENT_AMBULATORY_CARE_PROVIDER_SITE_OTHER): Payer: Medicare Other | Admitting: Cardiovascular Disease

## 2012-06-19 ENCOUNTER — Encounter: Payer: Self-pay | Admitting: *Deleted

## 2012-06-19 VITALS — BP 124/64 | HR 65 | Ht 72.0 in | Wt 272.0 lb

## 2012-06-19 DIAGNOSIS — I495 Sick sinus syndrome: Secondary | ICD-10-CM

## 2012-06-19 DIAGNOSIS — Z950 Presence of cardiac pacemaker: Secondary | ICD-10-CM | POA: Insufficient documentation

## 2012-06-19 DIAGNOSIS — N189 Chronic kidney disease, unspecified: Secondary | ICD-10-CM

## 2012-06-19 DIAGNOSIS — I2510 Atherosclerotic heart disease of native coronary artery without angina pectoris: Secondary | ICD-10-CM

## 2012-06-19 DIAGNOSIS — E785 Hyperlipidemia, unspecified: Secondary | ICD-10-CM

## 2012-06-19 DIAGNOSIS — I272 Pulmonary hypertension, unspecified: Secondary | ICD-10-CM

## 2012-06-19 DIAGNOSIS — I2789 Other specified pulmonary heart diseases: Secondary | ICD-10-CM

## 2012-06-19 NOTE — Assessment & Plan Note (Signed)
No recent lipids available for our review. Followed by Dr. Sullivan Lone. Goal LDL less than 70.

## 2012-06-19 NOTE — Assessment & Plan Note (Signed)
We have stressed to him the importance of better diabetes control and weight loss given his underlying renal failure.

## 2012-06-19 NOTE — Assessment & Plan Note (Signed)
Currently with no symptoms of angina. No further workup at this time. Continue current medication regimen. 

## 2012-06-19 NOTE — Assessment & Plan Note (Signed)
Appears relatively euvolemic despite weight gain. We have suggested he continue on his current Lasix regimen of 40 twice a day with extra doses as needed for edema.

## 2012-06-19 NOTE — Assessment & Plan Note (Signed)
Currently on amiodarone 100 mg daily. Pacemaker appears to be functioning appropriately. He has followup with Dr. [name] next week.

## 2012-06-19 NOTE — Progress Notes (Signed)
Patient ID: Marco [name], male    DOB: Feb 04, 1936, 76 y.o.   MRN: [id number]  HPI Comments: Mr. Primm is a very pleasant 76 year old gentleman with a history of coronary artery disease, CABG, atrial fibrillation and DVT, on warfarin, diabetes, previous smoking history PVD, carotid endarterectomy, sick sinus syndrome who recently presented to the hospital at [facility] after being seen by Dr. Willeen Cass of ear nose throat with increasing shortness of breath, oxygen saturations to the mid 80s on room air. Cardiology was consulted for systolic and diastolic CHF.   His weight is much higher compared to his last clinic visit. He reports no worsening of his edema. In fact he reports he is well controlled. He is taking Lasix 40 mg twice a day with extra dose of Lasix as needed for edema. He wears compression hose. No significant shortness of breath beyond his baseline. He reports that his blood pressure is typically well-controlled in the 130 range over 80 though it is labile. Blood pressure was recently low with Dr. Sullivan Lone, and high home. On average it is stable. Hemoglobin A1c of 8  Renal function in July 2013 showed creatinine 2.9. He is seen by Dr. Caryn Section of [facility]. Last seen August 2013. No labs at that time per the patient.  He has LIMA to the LAD, vein graft to the circumflex, vein graft to the PL in June 2006 Also has mitral valve her care with #28 Edwards logic ring Pacemaker placement AAA repair May 2008  Echocardiogram in the hospital 03/12/2012 showed moderate right ventricular pressures 50-60 mmHg consistent with moderate pulmonary hypertension, ejection fraction 30-35%, moderately dilated left atrium, dilated left ventricle.  EKG shows  paced rhythm with ventricular rate 66 beats per minute   Outpatient Encounter Prescriptions as of 06/19/2012  Medication Sig Dispense Refill  . amiodarone (PACERONE) 100 MG tablet Take 1 tablet (100 mg total) by mouth daily.  30 tablet  11  . aspirin  81 MG tablet Take 81 mg by mouth daily.        [name] azelastine (ASTELIN) 137 MCG/SPRAY nasal spray Inhale 137 mcg into the lungs at bedtime. 2 puffs at bedtime      . CALCITRIOL PO Take 0.5 mcg by mouth daily. 1 po daily      . carvedilol (COREG) 6.25 MG tablet Take 1 tablet (6.25 mg total) by mouth 2 (two) times daily.  60 tablet  11  . docusate sodium (COLACE) 100 MG capsule Take 100 mg by mouth 2 (two) times daily.        . furosemide (LASIX) 40 MG tablet Take 40 mg by mouth 2 (two) times daily.        . insulin aspart (NOVOLOG) 100 UNIT/ML injection Inject 15 Units into the skin 2 (two) times daily. As directed      . isosorbide dinitrate (ISORDIL) 30 MG tablet TAKE 1 TABLET BY MOUTH TWICE A DAY  60 tablet  6  . NOVOLOG MIX 70/30 (70-30) 100 UNIT/ML injection Inject into the skin. As directed.      [name] omeprazole (PRILOSEC) 20 MG capsule Taking 3 times a week.      [name] PATADAY 0.2 % SOLN Apply 0.2 mLs topically as needed.      . rosuvastatin (CRESTOR) 10 MG tablet Take 10 mg by mouth daily.        . Tamsulosin HCl (FLOMAX) 0.4 MG CAPS Take 0.4 mg by mouth. 1 po daily       . warfarin (  COUMADIN) 2 MG tablet 2 mg. As directed          Review of Systems  Constitutional: Positive for unexpected weight change.  HENT: Negative.   Eyes: Negative.   Respiratory: Positive for shortness of breath.   Gastrointestinal: Negative.   Musculoskeletal: Negative.   Skin: Negative.   Neurological: Negative.   Hematological: Negative.   Psychiatric/Behavioral: Negative.   All other systems reviewed and are negative.    BP 124/64  Pulse 65  Ht 6' (1.829 m)  Wt 272 lb (123.378 kg)  BMI 36.89 kg/m2  Physical Exam  Nursing note and vitals reviewed. Constitutional: He is oriented to person, place, and time. He appears well-developed and well-nourished.       Obese  HENT:  Head: Normocephalic.  Nose: Nose normal.  Mouth/Throat: Oropharynx is clear and moist.  Eyes: Conjunctivae normal are normal.  Pupils are equal, round, and reactive to light.  Neck: Normal range of motion. Neck supple. No JVD present.  Cardiovascular: Normal rate, regular rhythm, S1 normal, S2 normal, normal heart sounds and intact distal pulses.  Exam reveals no gallop and no friction rub.   No murmur heard. Pulmonary/Chest: Effort normal and breath sounds normal. No respiratory distress. He has no wheezes. He has no rales. He exhibits no tenderness.  Abdominal: Soft. Bowel sounds are normal. He exhibits no distension. There is no tenderness.  Musculoskeletal: Normal range of motion. He exhibits no edema and no tenderness.  Lymphadenopathy:    He has no cervical adenopathy.  Neurological: He is alert and oriented to person, place, and time. Coordination normal.  Skin: Skin is warm and dry. No rash noted. No erythema.  Psychiatric: He has a normal mood and affect. His behavior is normal. Judgment and thought content normal.           Assessment and Plan

## 2012-06-19 NOTE — Patient Instructions (Addendum)
You are doing well. No medication changes were made.  Please call us if you have new issues that need to be addressed before your next appt.  Your physician wants you to follow-up in: 6 months.  You will receive a reminder letter in the mail two months in advance. If you don't receive a letter, please call our office to schedule the follow-up appointment.   

## 2012-06-25 ENCOUNTER — Encounter: Payer: Self-pay | Admitting: *Deleted

## 2012-06-25 ENCOUNTER — Encounter (HOSPITAL_COMMUNITY): Payer: Self-pay | Admitting: Pharmacy Technician

## 2012-06-25 ENCOUNTER — Encounter: Payer: Self-pay | Admitting: Internal Medicine

## 2012-06-25 ENCOUNTER — Ambulatory Visit (INDEPENDENT_AMBULATORY_CARE_PROVIDER_SITE_OTHER): Payer: Medicare Other | Admitting: Internal Medicine

## 2012-06-25 VITALS — BP 100/68 | HR 62 | Ht 72.0 in | Wt 270.0 lb

## 2012-06-25 DIAGNOSIS — I495 Sick sinus syndrome: Secondary | ICD-10-CM

## 2012-06-25 DIAGNOSIS — I472 Ventricular tachycardia: Secondary | ICD-10-CM

## 2012-06-25 DIAGNOSIS — I493 Ventricular premature depolarization: Secondary | ICD-10-CM

## 2012-06-25 DIAGNOSIS — I2510 Atherosclerotic heart disease of native coronary artery without angina pectoris: Secondary | ICD-10-CM

## 2012-06-25 DIAGNOSIS — Z950 Presence of cardiac pacemaker: Secondary | ICD-10-CM

## 2012-06-25 DIAGNOSIS — I499 Cardiac arrhythmia, unspecified: Secondary | ICD-10-CM

## 2012-06-25 DIAGNOSIS — I4949 Other premature depolarization: Secondary | ICD-10-CM

## 2012-06-25 DIAGNOSIS — I428 Other cardiomyopathies: Secondary | ICD-10-CM

## 2012-06-25 DIAGNOSIS — Z5181 Encounter for therapeutic drug level monitoring: Secondary | ICD-10-CM

## 2012-06-25 LAB — PACEMAKER DEVICE OBSERVATION
AL IMPEDENCE PM: 480 Ohm
BAMS-0001: 170 {beats}/min
RV LEAD AMPLITUDE: 19.6648 mv

## 2012-06-25 MED ORDER — SODIUM CHLORIDE 0.9 % IR SOLN
80.0000 mg | Status: DC
  Filled 2012-06-25: qty 2

## 2012-06-25 MED ORDER — DEXTROSE 5 % IV SOLN
3.0000 g | INTRAVENOUS | Status: DC
  Filled 2012-06-25: qty 3000

## 2012-06-25 NOTE — Assessment & Plan Note (Addendum)
Patient is clinically stable. I have increased his AV delay to allow for intrinsic conduction. This may help improve cardiac performance

## 2012-06-25 NOTE — Assessment & Plan Note (Signed)
The patient has no intrinsic atrial rhythm; he does have a junctional escape rhythm. Chronotropic competence is restored by his device

## 2012-06-25 NOTE — Progress Notes (Signed)
Patient Care Team: Richard Gilbert, MD as PCP - General (Unknown Physician Specialty) James Hochrein, MD (Cardiology)   HPI  Marco [name] is a 76 y.o. male Seen to establish pacemaker followup in [place]. He is status post pacemaker implantation with a Medtronic dual-chamber device for sick sinus syndrome. He also has a history of coronary artery disease with prior stenting and bypass surgery with mitral valve repair. Most recent ejection fraction 2010 with 40% with moderate dilatation  He has a prior history of PVC ablation which was incompletely successful and he has been treated subsequently with low-dose amiodarone.  He comes in today and his pacemaker is ERI. He has no noticeable attributable symptoms  Past Medical History  Diagnosis Date  . Cardiomyopathy     EF 40%  . Coronary artery disease      The patient had stent to the right coronary artery in 1999.  The patient had unstable angina.  Non-Q-wave myocardial infarction in June 2006.  Coronary artery bypass graft surgery on February 01, 2005, with  mitral valve annuloplasty with placement of a 28-mm Edwards   ETlogix ring.     . Arrhythmia      Sick sinus syndrome/sinus node dysfunction after coronary   artery bypass graft/mitral valve replacement  . Mitral regurgitation   . Status post biventricular cardiac pacemaker insertion     Medtronic EnRhythm dual-chamber pacemaker implanted on February 09, 2005.   . Diabetes mellitus   . Hypertension   . Hyperlipidemia   . BPH (benign prostatic hypertrophy)   . Renal insufficiency   . GERD (gastroesophageal reflux disease)   . CHF (congestive heart failure)     New York Heart Association class II-III   . Obesity   . DVT (deep venous thrombosis)   . Anemia   . Sleep apnea     Not tolerant of CPAP  . Skin cancer 2013    [name] x 2 back and left neck  . AAA (abdominal aortic aneurysm) 2008    repair    Past Surgical History  Procedure Date  . Coronary artery bypass  graft     CABG with a  LIMA to the LAD, SVG to circumflex, SVG to posterolateral June2006.  . Mitral valve repair 2006     mitral valve repair with a #28 Edwards logic ring  . Pacemaker placement   . Abdominal aortic aneurysm repair      May 2008,   . Cardiac catheterization 2006    @ [facility]  . Insert / replace / remove pacemaker 2006    Dr. Klein  . Skin cancer excision      x 6    Current Outpatient Prescriptions  Medication Sig Dispense Refill  . amiodarone (PACERONE) 100 MG tablet Take 1 tablet (100 mg total) by mouth daily.  30 tablet  11  . aspirin 81 MG tablet Take 81 mg by mouth daily.        . azelastine (ASTELIN) 137 MCG/SPRAY nasal spray Inhale 137 mcg into the lungs at bedtime. 2 puffs at bedtime      . CALCITRIOL PO Take 0.5 mcg by mouth daily. 1 po daily      . carvedilol (COREG) 6.25 MG tablet Take 1 tablet (6.25 mg total) by mouth 2 (two) times daily.  60 tablet  11  . docusate sodium (COLACE) 100 MG capsule Take 100 mg by mouth 2 (two) times daily.        . furosemide (LASIX)   40 MG tablet Take 40 mg by mouth 2 (two) times daily.        . insulin aspart (NOVOLOG) 100 UNIT/ML injection Inject 15 Units into the skin 2 (two) times daily. As directed      . isosorbide dinitrate (ISORDIL) 30 MG tablet TAKE 1 TABLET BY MOUTH TWICE A DAY  60 tablet  6  . NOVOLOG MIX 70/30 (70-30) 100 UNIT/ML injection Inject into the skin. As directed.      . omeprazole (PRILOSEC) 20 MG capsule Taking 3 times a week.      . PATADAY 0.2 % SOLN Apply 0.2 mLs topically as needed.      . rosuvastatin (CRESTOR) 10 MG tablet Take 10 mg by mouth daily.        . Tamsulosin HCl (FLOMAX) 0.4 MG CAPS Take 0.4 mg by mouth. 1 po daily       . warfarin (COUMADIN) 2 MG tablet 2 mg. As directed         Allergies  Allergen Reactions  . Shellfish Allergy     Review of Systems negative except from HPI and PMH  Physical Exam BP 100/68  Pulse 62  Ht 6' (1.829 m)  Wt 270 lb (122.471 kg)  BMI 36.62  kg/m2  SpO2 91% Well developed and morbidly obese Caucasian male in no acute distress HENT normal E scleral and icterus clear Neck Supple JVP flat; carotids brisk and full Clear to ausculation Device pocket well healed; without hematoma or erythema regular rate and rhythm, no murmurs gallops or rub Soft with active bowel sounds No clubbing cyanosis none Edema Alert and oriented, grossly normal motor and sensory function Skin Warm and Dry    Assessment and  Plan  

## 2012-06-25 NOTE — Assessment & Plan Note (Signed)
Patient has ischemic heart disease with prior bypass. He has no symptoms.

## 2012-06-25 NOTE — Assessment & Plan Note (Signed)
PVCs are largely quiet at this time. We will continue amiodarone and check surveillance laboratories today

## 2012-06-25 NOTE — Assessment & Plan Note (Addendum)
The patient's device was interrogated and the information was fully reviewed.  The device was reprogrammed to DDD with AV delay to allow intrinsic conduction  The device is at [facility]   The benefits and risks were reviewed including but not limited to   Infection   The patient understands agrees and is willing to proceed.

## 2012-06-26 ENCOUNTER — Other Ambulatory Visit: Payer: Self-pay | Admitting: *Deleted

## 2012-06-26 ENCOUNTER — Ambulatory Visit (HOSPITAL_COMMUNITY): Payer: Medicare Other

## 2012-06-26 ENCOUNTER — Ambulatory Visit (HOSPITAL_COMMUNITY)
Admission: RE | Admit: 2012-06-26 | Discharge: 2012-06-26 | Disposition: A | Discharge status: Home / Self Care | Payer: Medicare Other | Source: Ambulatory Visit | Attending: Internal Medicine | Admitting: Internal Medicine

## 2012-06-26 ENCOUNTER — Encounter (HOSPITAL_COMMUNITY)
Admission: RE | Disposition: A | Discharge status: Home / Self Care | Payer: Self-pay | Source: Ambulatory Visit | Attending: Internal Medicine

## 2012-06-26 DIAGNOSIS — Z950 Presence of cardiac pacemaker: Secondary | ICD-10-CM

## 2012-06-26 DIAGNOSIS — I472 Ventricular tachycardia: Secondary | ICD-10-CM

## 2012-06-26 DIAGNOSIS — I495 Sick sinus syndrome: Secondary | ICD-10-CM

## 2012-06-26 DIAGNOSIS — I2510 Atherosclerotic heart disease of native coronary artery without angina pectoris: Secondary | ICD-10-CM | POA: Insufficient documentation

## 2012-06-26 DIAGNOSIS — I10 Essential (primary) hypertension: Secondary | ICD-10-CM | POA: Insufficient documentation

## 2012-06-26 DIAGNOSIS — Z45018 Encounter for adjustment and management of other part of cardiac pacemaker: Secondary | ICD-10-CM | POA: Insufficient documentation

## 2012-06-26 DIAGNOSIS — I499 Cardiac arrhythmia, unspecified: Secondary | ICD-10-CM

## 2012-06-26 DIAGNOSIS — Z951 Presence of aortocoronary bypass graft: Secondary | ICD-10-CM | POA: Insufficient documentation

## 2012-06-26 DIAGNOSIS — I428 Other cardiomyopathies: Secondary | ICD-10-CM

## 2012-06-26 DIAGNOSIS — E119 Type 2 diabetes mellitus without complications: Secondary | ICD-10-CM | POA: Insufficient documentation

## 2012-06-26 DIAGNOSIS — I493 Ventricular premature depolarization: Secondary | ICD-10-CM

## 2012-06-26 HISTORY — PX: PERMANENT PACEMAKER GENERATOR CHANGE: SHX6022

## 2012-06-26 LAB — CBC WITH DIFFERENTIAL/PLATELET
Basophils Absolute: 0 10*3/uL (ref 0.0–0.2)
Eosinophils Absolute: 0.2 10*3/uL (ref 0.0–0.4)
Immature Grans (Abs): 0 10*3/uL (ref 0.0–0.1)
Lymphs: 15 % (ref 14–46)
MCH: 29.6 pg (ref 26.6–33.0)
MCHC: 32.1 g/dL (ref 31.5–35.7)
Neutrophils Relative %: 73 % (ref 40–74)

## 2012-06-26 LAB — GLUCOSE, CAPILLARY: Glucose-Capillary: 110 mg/dL — ABNORMAL HIGH (ref 70–99)

## 2012-06-26 LAB — BASIC METABOLIC PANEL
Calcium: 9.3 mg/dL (ref 8.6–10.2)
Chloride: 99 mmol/L (ref 97–108)
Glucose: 175 mg/dL — ABNORMAL HIGH (ref 65–99)
Potassium: 5 mmol/L (ref 3.5–5.2)

## 2012-06-26 LAB — PROTIME-INR
INR: 2.3 — ABNORMAL HIGH (ref 0.8–1.2)
Prothrombin Time: 23.2 seconds — ABNORMAL HIGH (ref 11.6–15.2)

## 2012-06-26 SURGERY — PERMANENT PACEMAKER GENERATOR CHANGE
Anesthesia: LOCAL

## 2012-06-26 MED ORDER — SODIUM CHLORIDE 0.9 % IJ SOLN: 3.0000 mL | Freq: Two times a day (BID) | INTRAMUSCULAR | Status: DC

## 2012-06-26 MED ORDER — SODIUM CHLORIDE 0.9 % IJ SOLN: 3.0000 mL | INTRAMUSCULAR | Status: DC | PRN

## 2012-06-26 MED ORDER — AMIODARONE HCL 200 MG PO TABS: 100.0000 mg | ORAL_TABLET | Freq: Every day | ORAL | Status: DC

## 2012-06-26 MED ORDER — MIDAZOLAM HCL 5 MG/5ML IJ SOLN
INTRAMUSCULAR | Status: AC
  Filled 2012-06-26: qty 5

## 2012-06-26 MED ORDER — MUPIROCIN 2 % EX OINT
TOPICAL_OINTMENT | Freq: Two times a day (BID) | CUTANEOUS | Status: DC
  Administered 2012-06-26: 1 via NASAL
  Filled 2012-06-26 (×2): qty 22

## 2012-06-26 MED ORDER — CHLORHEXIDINE GLUCONATE 4 % EX LIQD
60.0000 mL | Freq: Once | CUTANEOUS | Status: DC
Start: 2012-06-26 — End: 2012-06-26
  Filled 2012-06-26: qty 60

## 2012-06-26 MED ORDER — SODIUM CHLORIDE 0.45 % IV SOLN
INTRAVENOUS | Status: DC
  Administered 2012-06-26: 09:00:00 via INTRAVENOUS

## 2012-06-26 MED ORDER — SODIUM CHLORIDE 0.9 % IV SOLN
250.0000 mL | INTRAVENOUS | Status: DC
Start: 2012-06-26 — End: 2012-06-26

## 2012-06-26 MED ORDER — FENTANYL CITRATE 0.05 MG/ML IJ SOLN
INTRAMUSCULAR | Status: AC
  Filled 2012-06-26: qty 2

## 2012-06-26 NOTE — Interval H&P Note (Signed)
History and Physical Interval Note:  06/26/2012 10:32 AM  Marco [name]  has presented today for surgery, with the diagnosis of bradycardia  The various methods of treatment have been discussed with the patient and family. After consideration of risks, benefits and other options for treatment, the patient has consented to  Procedure(s) (LRB) with comments: PERMANENT PACEMAKER GENERATOR CHANGE (N/A) as a surgical intervention .  The patient's history has been reviewed, patient examined, no change in status, stable for surgery.  I have reviewed the patient's chart and labs.  Questions were answered to the patient's satisfaction.     Sherryl Manges

## 2012-06-26 NOTE — CV Procedure (Signed)
Preoperative diagnosis  Sinus node dysfunction Postoperative diagnosis same/   Procedure: Generator replacement    Following informed consent the patient was brought to the electrophysiology laboratory in place of the fluoroscopic table in the supine position after routine prep and drape lidocaine was infiltrated in the region of the previous incision and carried down to later the device pocket using sharp dissection and electrocautery. The pocket was opened the device was freed up and was explanted.  Interrogation of the previously implanted ventricular lead  demonstrated an R wave of 20.6 millivolts., and impedance of 459ohms, and a pacing threshold of 0.9 volts at 0.5 msec.    The previously implanted atrial lead  added P-wave amplitude of 4.7 illlivolts  and impedance of  997 ohms, and a pacing threshold of 0.6volts at 0.1milliseconds.  The leads were inspected. The leads were then attached to a Medtronic  pulse generator, serial number [id number].    The pocket was irrigated with antibiotic containing saline solution hemostasis was assured and the leads and the device were placed in the pocket. The wound is then closed in 3 layers in normal fashion.  The patient tolerated the procedure without apparent complication.  Sherryl Manges

## 2012-06-26 NOTE — Telephone Encounter (Signed)
Refilled Amiodarone. 

## 2012-06-26 NOTE — H&P (View-Only) (Signed)
Patient Care Team: [name], MD as PCP - General (Unknown Physician Specialty) [name], MD (Cardiology)   HPI  Marco [name] is a 76 y.o. male Seen to establish pacemaker followup in [place]. He is status post pacemaker implantation with a Medtronic dual-chamber device for sick sinus syndrome. He also has a history of coronary artery disease with prior stenting and bypass surgery with mitral valve repair. Most recent ejection fraction 2010 with 40% with moderate dilatation  He has a prior history of PVC ablation which was incompletely successful and he has been treated subsequently with low-dose amiodarone.  He comes in today and his pacemaker is ERI. He has no noticeable attributable symptoms  Past Medical History  Diagnosis Date  . Cardiomyopathy     EF 40%  . Coronary artery disease      The patient had stent to the right coronary artery in 1999.  The patient had unstable angina.  Non-Q-wave myocardial infarction in June 2006.  Coronary artery bypass graft surgery on February 01, 2005, with  mitral valve annuloplasty with placement of a 28-mm Edwards   ETlogix ring.     . Arrhythmia      Sick sinus syndrome/sinus node dysfunction after coronary   artery bypass graft/mitral valve replacement  . Mitral regurgitation   . Status post biventricular cardiac pacemaker insertion     Medtronic EnRhythm dual-chamber pacemaker implanted on February 09, 2005.   . Diabetes mellitus   . Hypertension   . Hyperlipidemia   . BPH (benign prostatic hypertrophy)   . Renal insufficiency   . GERD (gastroesophageal reflux disease)   . CHF (congestive heart failure)     New York Heart Association class II-III   . Obesity   . DVT (deep venous thrombosis)   . Anemia   . Sleep apnea     Not tolerant of CPAP  . Skin cancer 2013    [name] x 2 back and left neck  . AAA (abdominal aortic aneurysm) 2008    repair    Past Surgical History  Procedure Date  . Coronary artery bypass  graft     CABG with a  LIMA to the LAD, SVG to circumflex, SVG to posterolateral June2006.  . Mitral valve repair 2006     mitral valve repair with a #28 Edwards logic ring  . Pacemaker placement   . Abdominal aortic aneurysm repair      May 2008,   . Cardiac catheterization 2006    @ [facility]  . Insert / replace / remove pacemaker 2006    Dr. [name]  . Skin cancer excision      x 6    Current Outpatient Prescriptions  Medication Sig Dispense Refill  . amiodarone (PACERONE) 100 MG tablet Take 1 tablet (100 mg total) by mouth daily.  30 tablet  11  . aspirin 81 MG tablet Take 81 mg by mouth daily.        [name] azelastine (ASTELIN) 137 MCG/SPRAY nasal spray Inhale 137 mcg into the lungs at bedtime. 2 puffs at bedtime      . CALCITRIOL PO Take 0.5 mcg by mouth daily. 1 po daily      . carvedilol (COREG) 6.25 MG tablet Take 1 tablet (6.25 mg total) by mouth 2 (two) times daily.  60 tablet  11  . docusate sodium (COLACE) 100 MG capsule Take 100 mg by mouth 2 (two) times daily.        . furosemide (LASIX)  40 MG tablet Take 40 mg by mouth 2 (two) times daily.        . insulin aspart (NOVOLOG) 100 UNIT/ML injection Inject 15 Units into the skin 2 (two) times daily. As directed      . isosorbide dinitrate (ISORDIL) 30 MG tablet TAKE 1 TABLET BY MOUTH TWICE A DAY  60 tablet  6  . NOVOLOG MIX 70/30 (70-30) 100 UNIT/ML injection Inject into the skin. As directed.      [name] omeprazole (PRILOSEC) 20 MG capsule Taking 3 times a week.      [name] PATADAY 0.2 % SOLN Apply 0.2 mLs topically as needed.      . rosuvastatin (CRESTOR) 10 MG tablet Take 10 mg by mouth daily.        . Tamsulosin HCl (FLOMAX) 0.4 MG CAPS Take 0.4 mg by mouth. 1 po daily       . warfarin (COUMADIN) 2 MG tablet 2 mg. As directed         Allergies  Allergen Reactions  . Shellfish Allergy     Review of Systems negative except from HPI and PMH  Physical Exam BP 100/68  Pulse 62  Ht 6' (1.829 m)  Wt 270 lb (122.471 kg)  BMI 36.62  kg/m2  SpO2 91% Well developed and morbidly obese Caucasian male in no acute distress HENT normal E scleral and icterus clear Neck Supple JVP flat; carotids brisk and full Clear to ausculation Device pocket well healed; without hematoma or erythema regular rate and rhythm, no murmurs gallops or rub Soft with active bowel sounds No clubbing cyanosis none Edema Alert and oriented, grossly normal motor and sensory function Skin Warm and Dry    Assessment and  Plan

## 2012-07-01 ENCOUNTER — Institutional Professional Consult (permissible substitution): Payer: Medicare Other | Admitting: Cardiovascular Disease

## 2012-07-02 ENCOUNTER — Ambulatory Visit: Payer: Medicare Other

## 2012-07-10 ENCOUNTER — Ambulatory Visit (INDEPENDENT_AMBULATORY_CARE_PROVIDER_SITE_OTHER): Payer: Medicare Other | Admitting: *Deleted

## 2012-07-10 DIAGNOSIS — I499 Cardiac arrhythmia, unspecified: Secondary | ICD-10-CM

## 2012-07-10 DIAGNOSIS — Z950 Presence of cardiac pacemaker: Secondary | ICD-10-CM

## 2012-07-10 DIAGNOSIS — I495 Sick sinus syndrome: Secondary | ICD-10-CM

## 2012-07-10 LAB — PACEMAKER DEVICE OBSERVATION
AL IMPEDENCE PM: 479 Ohm
AL THRESHOLD: 0.5 V
ATRIAL PACING PM: 100
BAMS-0001: 150 {beats}/min
RV LEAD AMPLITUDE: 22.4 mv
RV LEAD IMPEDENCE PM: 455 Ohm
RV LEAD THRESHOLD: 1 V

## 2012-07-10 NOTE — Progress Notes (Signed)
Wound check pacer in clinic  

## 2012-07-20 ENCOUNTER — Other Ambulatory Visit: Payer: Self-pay | Admitting: Cardiology

## 2012-08-05 ENCOUNTER — Encounter: Payer: Self-pay | Admitting: Internal Medicine

## 2012-08-09 ENCOUNTER — Encounter: Payer: Self-pay | Admitting: Internal Medicine

## 2012-09-19 ENCOUNTER — Ambulatory Visit: Payer: Self-pay | Admitting: Family Medicine

## 2012-10-01 ENCOUNTER — Ambulatory Visit (INDEPENDENT_AMBULATORY_CARE_PROVIDER_SITE_OTHER): Payer: Medicare Other | Admitting: Internal Medicine

## 2012-10-01 ENCOUNTER — Encounter: Payer: Self-pay | Admitting: Internal Medicine

## 2012-10-01 VITALS — BP 179/93 | HR 61 | Ht 72.0 in | Wt 272.8 lb

## 2012-10-01 DIAGNOSIS — I499 Cardiac arrhythmia, unspecified: Secondary | ICD-10-CM

## 2012-10-01 DIAGNOSIS — I2589 Other forms of chronic ischemic heart disease: Secondary | ICD-10-CM

## 2012-10-01 DIAGNOSIS — R079 Chest pain, unspecified: Secondary | ICD-10-CM

## 2012-10-01 DIAGNOSIS — I493 Ventricular premature depolarization: Secondary | ICD-10-CM

## 2012-10-01 DIAGNOSIS — Z950 Presence of cardiac pacemaker: Secondary | ICD-10-CM

## 2012-10-01 DIAGNOSIS — I4949 Other premature depolarization: Secondary | ICD-10-CM

## 2012-10-01 DIAGNOSIS — I255 Ischemic cardiomyopathy: Secondary | ICD-10-CM

## 2012-10-01 DIAGNOSIS — R0602 Shortness of breath: Secondary | ICD-10-CM

## 2012-10-01 DIAGNOSIS — I10 Essential (primary) hypertension: Secondary | ICD-10-CM

## 2012-10-01 LAB — PACEMAKER DEVICE OBSERVATION
ATRIAL PACING PM: 100
BAMS-0001: 150 {beats}/min
BATTERY VOLTAGE: 2.8 V
RV LEAD AMPLITUDE: 15.67 mv
RV LEAD IMPEDENCE PM: 449 Ohm
VENTRICULAR PACING PM: 0

## 2012-10-01 MED ORDER — HYDRALAZINE HCL 25 MG PO TABS: 25.0000 mg | ORAL_TABLET | Freq: Two times a day (BID) | ORAL | Status: DC

## 2012-10-01 NOTE — Patient Instructions (Addendum)
Your physician wants you to follow-up in: 1 year with Dr. [name]. You will receive a reminder letter in the mail two months in advance. If you don't receive a letter, please call our office to schedule the follow-up appointment.  Your physician has recommended you make the following change in your medication: -start hydralazine 25 mg- take 1/2 tablet twice daily

## 2012-10-01 NOTE — Assessment & Plan Note (Addendum)
Stable on low-dose amiodarone;  we will check surveillance laboratories

## 2012-10-01 NOTE — Addendum Note (Signed)
Addended by: Sabino Snipes E on: 10/01/2012 03:48 PM   Modules accepted: Orders

## 2012-10-01 NOTE — Progress Notes (Signed)
Patient Care Team: [name], MD as PCP - General (Unknown Physician Specialty) [name], MD (Cardiology)   HPI  Marco [name] is a 77 y.o. male Seen to establish pacemaker followup in [place]. He is status post pacemaker implantation with a Medtronic dual-chamber device for sick sinus syndrome. He underwent device generator replacement 10/13.  He also has a history of coronary artery disease with prior stenting and bypass surgery with mitral valve repair.  Last echocardiogram 7/13 demonstrated on her hypertension with pressures in the 50-60 range, left ventricular dysfunction with EF 30-35% without significant MR and significant left atrial dilatation@ 51.   He has a prior history of PVC ablation which was incomplete subsequently with low-dose amiodarone.  We have reviewed his old medication history going back to note to  3/12. At that time hydralazine was discontinued because of symptomatic hypotension. Sometime between 5/1 2 and 11/12 his carvedilol dose was reduced. It is not clear why that was so an interval blood pressures were 09/[birth year]/1928 range.   Having some problems with peripheral edema and he adjusts his diuretics on an day-to-day basis under the direction of Dr. Caryn Section to manage his fluid.  He has had intercurrent URI treated with a Z-Pak; he is much improved  He denies chest pain she      Past Medical History  Diagnosis Date  . Cardiomyopathy     EF 40%  . Coronary artery disease      The patient had stent to the right coronary artery in 1999.  The patient had unstable angina.  Non-Q-wave myocardial infarction in June 2006.  Coronary artery bypass graft surgery on February 01, 2005, with  mitral valve annuloplasty with placement of a 28-mm Edwards   ETlogix ring.     . Sick sinus syndrome     This  . Mitral regurgitation   . Pacemaker-medtronic     Medtronic EnRhythm dual-chamber pacemaker implanted on February 09, 2005.   . Diabetes mellitus   . Hypertension    . Hyperlipidemia   . BPH (benign prostatic hypertrophy)   . Renal insufficiency Gd 4   . GERD (gastroesophageal reflux disease)   . CHF (congestive heart failure)     New York Heart Association class II-III   . Obesity   . DVT (deep venous thrombosis)   . Anemia   . Sleep apnea     Not tolerant of CPAP  . Skin cancer 2013    [name] x 2 back and left neck  . AAA (abdominal aortic aneurysm)repaired 2008    repair    Past Surgical History  Procedure Date  . Coronary artery bypass graft     CABG with a  LIMA to the LAD, SVG to circumflex, SVG to posterolateral June2006.  . Mitral valve repair 2006     mitral valve repair with a #28 Edwards logic ring  . Pacemaker placement   . Abdominal aortic aneurysm repair      May 2008,   . Cardiac catheterization 2006    @ [facility]  . Insert / replace / remove pacemaker 2006    Dr. [name]  . Skin cancer excision      x 6  . Cataract extraction     right    Current Outpatient Prescriptions  Medication Sig Dispense Refill  . amiodarone (PACERONE) 200 MG tablet Take 0.5 tablets (100 mg total) by mouth daily.  30 tablet  3  . aspirin 81 MG tablet Take 81 mg  by mouth daily.        [name] azelastine (ASTELIN) 137 MCG/SPRAY nasal spray Inhale 2 sprays into the lungs at bedtime.       . calcitRIOL (ROCALTROL) 0.5 MCG capsule Take 0.5 mcg by mouth daily.      . carvedilol (COREG) 6.25 MG tablet Take 1 tablet (6.25 mg total) by mouth 2 (two) times daily.  60 tablet  11  . docusate sodium (COLACE) 100 MG capsule Take 100 mg by mouth 2 (two) times daily.        . furosemide (LASIX) 40 MG tablet Take 40 mg by mouth 2 (two) times daily.        . isosorbide dinitrate (ISORDIL) 30 MG tablet TAKE 1 TABLET BY MOUTH TWICE A DAY  60 tablet  6  . NON FORMULARY 1.5 liters      . NOVOLOG MIX 70/30 (70-30) 100 UNIT/ML injection Inject 15 Units into the skin 2 (two) times daily with a meal. As directed.      [name] omeprazole (PRILOSEC) 20 MG capsule Take 20 mg  by mouth every Monday, Wednesday, and Friday. Taking 3 times a week.      [name] PATADAY 0.2 % SOLN Apply 1 drop topically daily as needed. Allergies      . rosuvastatin (CRESTOR) 10 MG tablet Take 10 mg by mouth daily.        . Tamsulosin HCl (FLOMAX) 0.4 MG CAPS Take 0.4 mg by mouth at bedtime. 1 po daily      . warfarin (COUMADIN) 2 MG tablet Take 2 mg by mouth daily.         Allergies  Allergen Reactions  . Shellfish Allergy Hives    Review of Systems negative except from HPI and PMH  Physical Exam BP 179/93  Pulse 61  Ht 6' (1.829 m)  Wt 272 lb 12 oz (123.719 kg)  BMI 36.99 kg/m2 Well developed and morbidly obese Caucasian male in no acute distress HENT normal E scleral and icterus clear Neck Supple JVP flat; carotids brisk and full Clear to ausculation Device pocket well healed; without hematoma or erythema regular rate and rhythm, no murmurs gallops or rub Soft with active bowel sounds No clubbing cyanosis 1-2+ Edema Alert and oriented, grossly normal motor and sensory function Skin Warm and Dry    Assessment and  Plan

## 2012-10-01 NOTE — Assessment & Plan Note (Signed)
With a significant myopathy and elevated blood pressure, and not withstanding the old records related to hydralazine, I think with his left ventricular dysfunction and renal insufficiency that trying to get him back on hydralazine makes sense we'll start him on 12.5 mg twice daily and when he sees Dr. Caryn Section in the next couple of weeks, if he is tolerating this, he could be up titrated to 25 mg twice daily.

## 2012-10-01 NOTE — Assessment & Plan Note (Signed)
The patient's device was interrogated.  The information was reviewed. No changes were made in the programming.    

## 2012-10-01 NOTE — Assessment & Plan Note (Signed)
As above.

## 2012-10-16 ENCOUNTER — Other Ambulatory Visit: Payer: Self-pay | Admitting: *Deleted

## 2012-10-16 DIAGNOSIS — R0602 Shortness of breath: Secondary | ICD-10-CM

## 2012-10-16 DIAGNOSIS — I499 Cardiac arrhythmia, unspecified: Secondary | ICD-10-CM

## 2012-10-16 DIAGNOSIS — Z950 Presence of cardiac pacemaker: Secondary | ICD-10-CM

## 2012-10-16 DIAGNOSIS — I10 Essential (primary) hypertension: Secondary | ICD-10-CM

## 2012-10-16 DIAGNOSIS — I493 Ventricular premature depolarization: Secondary | ICD-10-CM

## 2012-10-16 DIAGNOSIS — R079 Chest pain, unspecified: Secondary | ICD-10-CM

## 2012-10-16 DIAGNOSIS — I255 Ischemic cardiomyopathy: Secondary | ICD-10-CM

## 2012-10-22 ENCOUNTER — Ambulatory Visit (INDEPENDENT_AMBULATORY_CARE_PROVIDER_SITE_OTHER): Payer: Medicare Other

## 2012-10-22 ENCOUNTER — Encounter: Payer: Self-pay | Admitting: Internal Medicine

## 2012-10-22 ENCOUNTER — Ambulatory Visit (INDEPENDENT_AMBULATORY_CARE_PROVIDER_SITE_OTHER): Payer: Medicare Other | Admitting: Internal Medicine

## 2012-10-22 VITALS — BP 108/72 | HR 119 | Ht 72.0 in | Wt 275.0 lb

## 2012-10-22 VITALS — BP 160/70 | HR 125 | Ht 72.0 in | Wt 275.0 lb

## 2012-10-22 DIAGNOSIS — Z950 Presence of cardiac pacemaker: Secondary | ICD-10-CM

## 2012-10-22 DIAGNOSIS — I4891 Unspecified atrial fibrillation: Secondary | ICD-10-CM | POA: Insufficient documentation

## 2012-10-22 DIAGNOSIS — N189 Chronic kidney disease, unspecified: Secondary | ICD-10-CM

## 2012-10-22 DIAGNOSIS — I5023 Acute on chronic systolic (congestive) heart failure: Secondary | ICD-10-CM

## 2012-10-22 LAB — PACEMAKER DEVICE OBSERVATION

## 2012-10-22 MED ORDER — APIXABAN 5 MG PO TABS: 5.0000 mg | ORAL_TABLET | Freq: Two times a day (BID) | ORAL | Status: DC

## 2012-10-22 NOTE — Progress Notes (Signed)
Pt was seeing Dr. Caryn Section in [place] Pt was noted to be tachycardic and hypotensive Dr. Caryn Section called Dr. [name] in [place] and Dr. [name] advised pt to come in to office for EKG to discuss possible cardioversion Pt came to [place] office instead  Per Dr. [name], he wanted [place] to go ahead and get EKG and scan in and have pt return to office at 1:30 in [place] to see Dr. [name] for appt  Pt and his wife are agreeable to this plan

## 2012-10-22 NOTE — Assessment & Plan Note (Signed)
The patient's device was interrogated.  The information was reviewed. No changes were made in the programming.    

## 2012-10-22 NOTE — Patient Instructions (Addendum)
Your physician has recommended you make the following change in your medication:  -stop warfarin/coumadin -start Eliquis 5 mg twice daily -increase coreg to 12.5 mg twice daily  Your physician has recommended that you have a Cardioversion (DCCV). Electrical Cardioversion uses a jolt of electricity to your heart either through paddles or wired patches attached to your chest. This is a controlled, usually prescheduled, procedure. Defibrillation is done under light anesthesia in the hospital, and you usually go home the day of the procedure. This is done to get your heart back into a normal rhythm. You are not awake for the procedure. Please see the instruction sheet given to you today.  Your physician has requested that you have a TEE. During a TEE, sound waves are used to create images of your heart. It provides your doctor with information about the size and shape of your heart and how well your heart's chambers and valves are working. In this test, a transducer is attached to the end of a flexible tube that's guided down your throat and into your esophagus (the tube leading from you mouth to your stomach) to get a more detailed image of your heart. You are not awake for the procedure. Please see the instruction sheet given to you today. For further information please visit [email or web address]     Electrical Cardioversion Cardioversion is the delivery of a jolt of electricity to change the rhythm of the heart. Sticky patches or metal paddles are placed on the chest to deliver the electricity from a special device. This is done to restore a normal rhythm. A rhythm that is too fast or not regular keeps the heart from pumping well. Compared to medicines used to change an abnormal rhythm, cardioversion is faster and works better. It is also unpleasant and may dislodge blood clots from the heart. WHEN WOULD THIS BE DONE?  In an emergency:  There is low or no blood pressure as a result of the heart  rhythm.  Normal rhythm must be restored as fast as possible to protect the brain and heart from further damage.  It may save a life.  For less serious heart rhythms, such as atrial fibrillation or flutter, in which:  The heart is beating too fast or is not regular.  The heart is still able to pump enough blood, but not as well as it should.  Medicine to change the rhythm has not worked.  It is safe to wait in order to allow time for preparation. LET YOUR CAREGIVER KNOW ABOUT:   Every medicine you are taking. It is very important to do this! Know when to take or stop taking any of them.  Any time in the past that you have felt your heart was not beating normally. RISKS AND COMPLICATIONS   Clots may form in the chambers of the heart if it is beating too fast. These clots may be dislodged during the procedure and travel to other parts of the body.  There is risk of a stroke during and after the procedure if a clot moves. Blood thinners lower this risk.  You may have a special test of your heart (TEE) to make sure there are no clots in your heart. BEFORE THE PROCEDURE   You may have some tests to see how well your heart is working.  You may start taking blood thinners so your blood does not clot as easily.  Other drugs may be given to help your heart work better. PROCEDURE (SCHEDULED)  The procedure  is typically done in a hospital by a heart doctor (cardiologist).  You will be told when and where to go.  You may be given some medicine through an intravenous (IV) access to reduce discomfort and make you sleepy before the procedure.  Your whole body may move when the shock is delivered. Your chest may feel sore.  You may be able to go home after a few hours. Your heart rhythm will be watched to make sure it does not change. HOME CARE INSTRUCTIONS   Only take medicine as directed by your caregiver. Be sure you understand how and when to take your medicine.  Learn how to feel  your pulse and check it often.  Limit your activity for 48 hours.  Avoid caffeine and other stimulants as directed. SEEK MEDICAL CARE IF:   You feel like your heart is beating too fast or your pulse is not regular.  You have any questions about your medicines.  You have bleeding that will not stop. SEEK IMMEDIATE MEDICAL CARE IF:   You are dizzy or feel faint.  It is hard to breathe or you feel short of breath.  There is a change in discomfort in your chest.  Your speech is slurred or you have trouble moving your arm or leg on one side.  You get a muscle cramp.  Your fingers or toes turn cold or blue. MAKE SURE YOU:   Understand these instructions.  Will watch your condition.  Will get help right away if you are not doing well or get worse. Document Released: 08/04/2002 Document Revised: 11/06/2011 Document Reviewed: 12/04/2007 [facility] Patient Information 2013 [place], [place]. Transesophageal Echocardiography A transesophageal echocardiogram (TEE) is a special type of test that produces images of the heart by sound waves (echocardiogram). This type of echocardiogram can obtain better images of the heart than a standard echocardiogram. A TEE is done by passing a flexible tube down the esophagus. The heart is located in front of the esophagus. Because the heart and esophagus are close to one another, your caregiver can take very clear, detailed pictures of the heart via ultrasound waves. WHY HAVE A TEE? Your caregiver may need more information based on your medical condition. A TEE is usually performed due to the following:  Your caregiver needs more information based on standard echocardiogram findings.  If you had a stroke, this might have happened because a clot formed in your heart. A TEE can visualize different areas of the heart and check for clots.  To check valve anatomy and function. Your caregiver will especially look at the mitral valve.  To check for redness,  soreness, and swelling (inflammation) on the inside lining of the heart (endocarditis).  To evaluate the dividing wall (septum) of the heart and presence of a hole that did not close after birth (patent foramen ovale, PFO).  To help diagnose a tear in the wall of the aorta (aortic dissection).  During cardiac valve surgery, a TEE probe is placed. This allows the surgeon to assess the valve repair before closing the chest. LET YOUR CAREGIVER KNOW ABOUT:   Swallowing difficulties.  An esophageal obstruction.  Use of aspirin or antiplatelet therapy. RISKS AND COMPLICATIONS  Though extremely rare, an esophageal tear (rupture) is a potential complication. BEFORE THE PROCEDURE   Arrive at least 1 hour before the procedure or as told by your caregiver.  Do not eat or drink for 6 hours before the procedure or as told by your caregiver.  An intravenous (  IV) access tube will be started in the arm. PROCEDURE   A medicine to help you relax (sedative) will be given through the IV.  A medicine that numbs the area (local anesthetic) may be sprayed to the back of the throat.  Your blood pressure, heart rate, and breathing (vital signs) will be monitored during the procedure.  The TEE probe is a long, flexible tube. It is about the width of an adult male's index finger. The tip of the probe is placed into the back of the mouth and you will be asked to swallow. This helps to pass the tip of the probe into the esophagus. Once the tip of the probe is in the correct area, your caregiver can take pictures of the heart.  A TEE is usually not a painful procedure. You may feel the probe press against the back of the throat. The probe does not enter the trachea and does not affect your breathing.  Your time spent at the hospital is usually less than 2 hours. AFTER THE PROCEDURE   You will be in bed, resting until you have fully returned to consciousness.  When you first awaken, your throat may feel  slightly sore and will probably still feel numb. This will improve slowly over time.  You will not be allowed to eat or drink until it is clear that numbness has improved.  Once you have been able to drink, urinate, and sit on the edge of the bed without feeling sick to your stomach (nauseous) or dizzy, you may be cleared to dress and go home.  Do not drive yourself home. You have had medications that can continue to make you feel drowsy and can impair your reflexes.  You should have a friend or family member with you for the next 24 hours after your examination. Obtaining the test results It is your responsibility to obtain your test results. Ask the lab or department performing the test when and how you will get your results. SEEK IMMEDIATE MEDICAL CARE IF:   There is chest pain.  You have a hard time breathing or have shortness of breath.  You cough or throw up (vomit) blood. MAKE SURE YOU:   Understand these instructions.  Will watch this condition.  Will get help right away if you is not doing well or gets worse. Document Released: 11/04/2002 Document Revised: 11/06/2011 Document Reviewed: 01/26/2009 [facility] Patient Information 2013 [place], [place].

## 2012-10-22 NOTE — Assessment & Plan Note (Signed)
The patient has symptomatic rapid atrial flutter-atypical. Unfortunately his INR is subtherapeutic today. We have reviewed a variety of options. We have elected to pursue his use of apixaban. He will be treated 5 mg twice daily based on dosing recommendations. The risk benefit ratio seems to, renal failure patients, the better even than warfarin. He will need a TE cardioversion. This will be done by Dr. Marcheta Grammes. He has met the family.

## 2012-10-22 NOTE — Progress Notes (Signed)
Patient Care Team: [name], MD as PCP - General (Unknown Physician Specialty) [name], MD (Cardiology)   HPI  Marco [name] is a 77 y.o. male Seen in followup at the request of Dr. Caryn Section because of tachycardia palpitations and feeling crummy. His heart rate was noted to be over 100. His renal function is stable about 2.4. Interrogation of his pacemaker demonstrated atrial flutter with 2 to one conduction and frequent blanking so that estimation of duration are difficult  He has noted shortness of breath and fatigue.  He has a history of a mixed cardiomyopathy with pulmonary hypertension and moderate RV dysfunction. He has no known atrial fibrillation( in our records) although he is on warfarin and amiodarone, the latter is for his PVC burden.  Past Medical History  Diagnosis Date  . Cardiomyopathy -mixed     EF 30-35% -- 7/13; Pulm HTN and modest RV dysfunction  . Coronary artery disease      The patient had stent to the right coronary artery in 1999.  The patient had unstable angina.  Non-Q-wave myocardial infarction in June 2006.  Coronary artery bypass graft surgery on February 01, 2005, with  mitral valve annuloplasty with   . Sick sinus syndrome   . Mitral regurgitation--s/p repair     placement of a 28-mm Edwards   ETlogix ring.     Orland Penman      Generator replacement 10/13  . Diabetes mellitus   . Hypertension   . Hyperlipidemia   . BPH (benign prostatic hypertrophy)   . Renal insufficiency Gd 4   . GERD (gastroesophageal reflux disease)   . Chronic systolic heart failure     New York Heart Association class II-III   . Obesity   . DVT (deep venous thrombosis)   . Anemia   . Sleep apnea     Not tolerant of CPAP  . Skin cancer 2013    [name] x 2 back and left neck  . AAA (abdominal aortic aneurysm)repaired 2008    repair  . Atrial fibrillation/Flutter     newly identified 2014  . PVC (premature ventricular contraction)     s/p RFCA  JAllred 2011    Past Surgical History  Procedure Laterality Date  . Coronary artery bypass graft      CABG with a  LIMA to the LAD, SVG to circumflex, SVG to posterolateral June2006.  . Mitral valve repair  2006     mitral valve repair with a #28 Edwards logic ring  . Pacemaker placement    . Abdominal aortic aneurysm repair       May 2008,   . Cardiac catheterization  2006    @ [facility]  . Insert / replace / remove pacemaker  2006    Dr. [name]  . Skin cancer excision       x 6  . Cataract extraction      right    Current Outpatient Prescriptions  Medication Sig Dispense Refill  . amiodarone (PACERONE) 200 MG tablet Take 0.5 tablets (100 mg total) by mouth daily.  30 tablet  3  . aspirin 81 MG tablet Take 81 mg by mouth daily.        [name] azelastine (ASTELIN) 137 MCG/SPRAY nasal spray Inhale 2 sprays into the lungs at bedtime.       . calcitRIOL (ROCALTROL) 0.5 MCG capsule Take 0.5 mcg by mouth daily.      . carvedilol (COREG) 6.25 MG tablet Take 1 tablet (  6.25 mg total) by mouth 2 (two) times daily.  60 tablet  11  . docusate sodium (COLACE) 100 MG capsule Take 100 mg by mouth 2 (two) times daily.        . furosemide (LASIX) 40 MG tablet Take 40 mg by mouth 2 (two) times daily.        . hydrALAZINE (APRESOLINE) 25 MG tablet Take 1 tablet (25 mg total) by mouth 2 (two) times daily.  270 tablet  3  . isosorbide dinitrate (ISORDIL) 30 MG tablet TAKE 1 TABLET BY MOUTH TWICE A DAY  60 tablet  6  . NON FORMULARY 1.5 liters      . NOVOLOG MIX 70/30 (70-30) 100 UNIT/ML injection Inject 15 Units into the skin 2 (two) times daily with a meal. As directed.      [name] omeprazole (PRILOSEC) 20 MG capsule Take 20 mg by mouth every Monday, Wednesday, and Friday. Taking 3 times a week.      [name] PATADAY 0.2 % SOLN Apply 1 drop topically daily as needed. Allergies      . rosuvastatin (CRESTOR) 10 MG tablet Take 10 mg by mouth daily.        . Tamsulosin HCl (FLOMAX) 0.4 MG CAPS Take 0.4 mg by mouth at  bedtime. 1 po daily      . warfarin (COUMADIN) 2 MG tablet Take 2 mg by mouth daily.        No current facility-administered medications for this visit.    Allergies  Allergen Reactions  . Shellfish Allergy Hives    Review of Systems negative except from HPI and PMH  Physical Exam BP 160/70  Pulse 125  Ht 6' (1.829 m)  Wt 275 lb (124.739 kg)  BMI 37.29 kg/m2 Well developed and well nourished in no acute distress HENT normal E scleral and icterus clear Neck Supple JVP flat; carotids brisk and full Clear to ausculation  Rapid but Regular rate and rhythm, no murmurs gallops or rub Soft with active bowel sounds No clubbing cyanosis 1+ Edema Alert and oriented, grossly normal motor and sensory function Skin Warm and Dry  Electrocardiogram demonstrates atrial flutter with variable conduction of  Assessment and  Plan

## 2012-10-22 NOTE — Assessment & Plan Note (Signed)
Creatinine is stable at 2.5 today.

## 2012-10-22 NOTE — Assessment & Plan Note (Signed)
Aggravated by atrial flutter with rapid rates. Expedite cardioversion is her best strategy. His blood pressure is also elevated.  We'll also ask him to increase his carvedilol 6.25-12.5 mg twice daily

## 2012-10-23 LAB — CBC WITH DIFFERENTIAL
Basophils Absolute: 0 10*3/uL (ref 0.0–0.2)
Eosinophils Absolute: 0.2 10*3/uL (ref 0.0–0.4)
HCT: 42.5 % (ref 37.5–51.0)
Lymphocytes Absolute: 1.2 10*3/uL (ref 0.7–3.1)
MCHC: 32 g/dL (ref 31.5–35.7)
MCV: 91 fL (ref 79–97)
Monocytes Absolute: 0.7 10*3/uL (ref 0.1–0.9)
Neutrophils Absolute: 6.3 10*3/uL (ref 1.4–7.0)
Platelets: 234 10*3/uL (ref 155–379)
RDW: 15.4 % (ref 12.3–15.4)

## 2012-10-23 LAB — BASIC METABOLIC PANEL
Chloride: 99 mmol/L (ref 97–108)
GFR calc Af Amer: 27 mL/min/{1.73_m2} — ABNORMAL LOW (ref >59)
GFR calc non Af Amer: 23 mL/min/{1.73_m2} — ABNORMAL LOW (ref >59)
Potassium: 4.1 mmol/L (ref 3.5–5.2)
Sodium: 140 mmol/L (ref 134–144)

## 2012-10-25 ENCOUNTER — Ambulatory Visit: Payer: Self-pay | Admitting: Internal Medicine

## 2012-10-25 DIAGNOSIS — I059 Rheumatic mitral valve disease, unspecified: Secondary | ICD-10-CM

## 2012-11-01 ENCOUNTER — Encounter: Payer: Medicare Other | Admitting: Cardiovascular Disease

## 2012-11-04 ENCOUNTER — Encounter: Payer: Self-pay | Admitting: Cardiovascular Disease

## 2012-11-04 ENCOUNTER — Ambulatory Visit (INDEPENDENT_AMBULATORY_CARE_PROVIDER_SITE_OTHER): Payer: Medicare Other | Admitting: Cardiovascular Disease

## 2012-11-04 VITALS — BP 121/73 | HR 108 | Ht 72.0 in | Wt 272.5 lb

## 2012-11-04 DIAGNOSIS — R000 Tachycardia, unspecified: Secondary | ICD-10-CM

## 2012-11-04 DIAGNOSIS — G4730 Sleep apnea, unspecified: Secondary | ICD-10-CM

## 2012-11-04 DIAGNOSIS — I4892 Unspecified atrial flutter: Secondary | ICD-10-CM

## 2012-11-04 DIAGNOSIS — I2589 Other forms of chronic ischemic heart disease: Secondary | ICD-10-CM

## 2012-11-04 DIAGNOSIS — I255 Ischemic cardiomyopathy: Secondary | ICD-10-CM

## 2012-11-04 MED ORDER — APIXABAN 5 MG PO TABS: 5.0000 mg | ORAL_TABLET | Freq: Two times a day (BID) | ORAL | Status: DC

## 2012-11-04 MED ORDER — AMIODARONE HCL 200 MG PO TABS: 200.0000 mg | ORAL_TABLET | Freq: Every day | ORAL | Status: DC

## 2012-11-04 NOTE — Assessment & Plan Note (Signed)
The patient has symptoms suggestive of severe sleep apnea and this was also noted during his recent TEE. I do think that this is contributing to his atrial arrhythmia and thus I will refer him back to Dr. Shelle Iron who saw him in 2009 for this problem.

## 2012-11-04 NOTE — Assessment & Plan Note (Signed)
Recent TEE could not exclude left atrial thrombus with severe spontaneous echo contrast. Thus, I did not proceed with cardioversion. Continue treatment with Apixaban which seems to be well tolerated so far. Increase amiodarone to 200 mg once daily. I will schedule him for cardioversion after 3 weeks of anticoagulation.

## 2012-11-04 NOTE — Addendum Note (Signed)
Addended by: MCNEAL, MELISSA E on: 11/04/2012 11:08 AM   Modules accepted: Orders

## 2012-11-04 NOTE — Progress Notes (Signed)
HPI  This is a 77 year old man who is here today for a followup visit. He was seen recently by Dr. [name] for newly diagnosed atrial flutter with rapid ventricular response.  Interrogation of his pacemaker demonstrated atrial flutter with 2 to 1 conduction. He had significant dyspnea and fatigue. He has a known history of a mixed cardiomyopathy with pulmonary hypertension and moderate RV dysfunction. He has no known atrial fibrillation( in our records) although he is on warfarin and amiodarone, the latter is for his PVC burden. He had previous CABG and mitral valve repair in 2006. He was scheduled for TEE/cardioversion which was performed by me. The TEE showed moderately to severely reduced LV systolic function with an EF of 25-30%, moderate right ventricular enlargement with moderately reduced RV function, moderate biatrial enlargement. Severe continuous contrast was seen in the left atrium. A thrombus could not be excluded. There was a lot of artifact from the mitral valve ring. Due to that, I did not proceed with cardioversion. The plan is to give him 3 full weeks of anticoagulation before proceeding. During the procedure and shortly after giving small dose of Versed and propofol, the patient became profoundly hypoxic and hypotensive with apnea noted. This was done under supervision of anesthesia. The patient was given Neo-Synephrine and his airways were stabilized. There was no adverse events. He continues to feel tired with no energy. He is not sleeping well at night and only sleeps about 2 hours. He is known to have history of sleep apnea and was evaluated in the past by Dr. Shelle Iron. He did not tolerate CPAP at that time.  Allergies  Allergen Reactions  . Shellfish Allergy Hives     Current Outpatient Prescriptions on File Prior to Visit  Medication Sig Dispense Refill  . aspirin 81 MG tablet Take 81 mg by mouth daily.        [name] azelastine (ASTELIN) 137 MCG/SPRAY nasal spray Inhale 2 sprays  into the lungs at bedtime.       . calcitRIOL (ROCALTROL) 0.5 MCG capsule Take 0.5 mcg by mouth daily.      [name] docusate sodium (COLACE) 100 MG capsule Take 100 mg by mouth 2 (two) times daily.        . furosemide (LASIX) 40 MG tablet Take 40 mg by mouth 2 (two) times daily.        . isosorbide dinitrate (ISORDIL) 30 MG tablet TAKE 1 TABLET BY MOUTH TWICE A DAY  60 tablet  6  . NON FORMULARY 1.5 liters      . NOVOLOG MIX 70/30 (70-30) 100 UNIT/ML injection Inject 15 Units into the skin 2 (two) times daily with a meal. As directed.      [name] omeprazole (PRILOSEC) 20 MG capsule Take 20 mg by mouth every Monday, Wednesday, and Friday. Taking 3 times a week.      [name] PATADAY 0.2 % SOLN Apply 1 drop topically daily as needed. Allergies      . rosuvastatin (CRESTOR) 10 MG tablet Take 10 mg by mouth daily.        . Tamsulosin HCl (FLOMAX) 0.4 MG CAPS Take 0.4 mg by mouth at bedtime. 1 po daily       No current facility-administered medications on file prior to visit.     Past Medical History  Diagnosis Date  . Cardiomyopathy -mixed     EF 30-35% -- 7/13; Pulm HTN and modest RV dysfunction  . Coronary artery disease  The patient had stent to the right coronary artery in 1999.  The patient had unstable angina.  Non-Q-wave myocardial infarction in June 2006.  Coronary artery bypass graft surgery on February 01, 2005, with  mitral valve annuloplasty with   . Sick sinus syndrome   . Mitral regurgitation--s/p repair     placement of a 28-mm Edwards   ETlogix ring.     Orland Penman      Generator replacement 10/13  . Diabetes mellitus   . Hypertension   . Hyperlipidemia   . BPH (benign prostatic hypertrophy)   . Renal insufficiency Gd 4   . GERD (gastroesophageal reflux disease)   . Chronic systolic heart failure     New York Heart Association class II-III   . Obesity   . DVT (deep venous thrombosis)   . Anemia   . Sleep apnea     Not tolerant of CPAP  . Skin cancer 2013    [place]  Carcinoma x 2 back and left neck  . AAA (abdominal aortic aneurysm)repaired 2008    repair  . Atrial fibrillation/Flutter     newly identified 2014  . PVC (premature ventricular contraction)     s/p RFCA JAllred 2011     Past Surgical History  Procedure Laterality Date  . Coronary artery bypass graft      CABG with a  LIMA to the LAD, SVG to circumflex, SVG to posterolateral June2006.  . Mitral valve repair  2006     mitral valve repair with a #28 Edwards logic ring  . Pacemaker placement    . Abdominal aortic aneurysm repair       May 2008,   . Cardiac catheterization  2006    @ [facility]  . Insert / replace / remove pacemaker  2006    Dr. [name]  . Skin cancer excision       x 6  . Cataract extraction      right  . Skin biopsy      left wrist; right elbow  . Transesophageal echocardiogram       Family History  Problem Relation Age of Onset  . Heart attack Mother 33  . Diabetes Sister   . Heart disease Sister   . Heart attack Brother     3 Brothers deceased from MI in 56's or 72's  . Heart disease Brother     Heart Disease before age 55  . Heart attack Father 61  . Diabetes Father     Amputation: bilateral feet  . Heart disease Father      History   Social History  . Marital Status: Married    Spouse Name: N/A    Number of Children: N/A  . Years of Education: N/A   Occupational History  . Retired     Emergency planning/management officer in [place]  .     Social History Main Topics  . Smoking status: Former Smoker -- 1.50 packs/day for 1 years    Types: Cigarettes    Quit date: 08/28/1986  . Smokeless tobacco: Not on file  . Alcohol Use: Yes     Comment: Occasionally but none since his surgery in June  . Drug Use: No  . Sexually Active: Not on file   Other Topics Concern  . Not on file   Social History Narrative  . No narrative on file     PHYSICAL EXAM   BP 121/73  Pulse 108  Ht 6' (1.829 m)  Wt 272  lb 8 oz (123.605 kg)  BMI 36.95 kg/m2 Constitutional:  He is oriented to person, place, and time. He appears well-developed and well-nourished. No distress.  HENT: No nasal discharge.  Head: Normocephalic and atraumatic.  Eyes: Pupils are equal and round. Right eye exhibits no discharge. Left eye exhibits no discharge.  Neck: Normal range of motion. Neck supple. No JVD present. No thyromegaly present.  Cardiovascular: Slightly tachycardic, regular rhythm, normal heart sounds and. Exam reveals no gallop and no friction rub. No murmur heard.  Pulmonary/Chest: Effort normal and breath sounds normal. No stridor. No respiratory distress. He has no wheezes. He has no rales. He exhibits no tenderness.  Abdominal: Soft. Bowel sounds are normal. He exhibits no distension. There is no tenderness. There is no rebound and no guarding.  Musculoskeletal: Normal range of motion. He exhibits no edema and no tenderness.  Neurological: He is alert and oriented to person, place, and time. Coordination normal.  Skin: Skin is warm and dry. No rash noted. He is not diaphoretic. No erythema. No pallor.  Psychiatric: He has a normal mood and affect. His behavior is normal. Judgment and thought content normal.       EKG: Atrial flutter with variable AV block -Nonspecific QRS widening.   -Nonspecific ST depression  -Nondiagnostic.   ABNORMAL    ASSESSMENT AND PLAN

## 2012-11-04 NOTE — Assessment & Plan Note (Signed)
Continue treatment with carvedilol as well as a combination of hydralazine and long-acting nitroglycerin. He appears to be euvolemic.

## 2012-11-04 NOTE — Patient Instructions (Addendum)
Increase Amiodarone to 200 mg once daily.  Continue other medications.  Schedule cardioversion on or after 11/18/2012.  Refer to Dr. Marcelyn Bruins for sleep apnea.   Labs and EKG 3/20 at 9:00 am

## 2012-11-05 ENCOUNTER — Encounter: Payer: Self-pay | Admitting: Internal Medicine

## 2012-11-07 ENCOUNTER — Other Ambulatory Visit: Payer: Self-pay | Admitting: *Deleted

## 2012-11-07 MED ORDER — APIXABAN 5 MG PO TABS: 5.0000 mg | ORAL_TABLET | Freq: Two times a day (BID) | ORAL | Status: DC

## 2012-11-08 ENCOUNTER — Encounter: Payer: Self-pay | Admitting: Internal Medicine

## 2012-11-12 ENCOUNTER — Other Ambulatory Visit: Payer: Self-pay | Admitting: *Deleted

## 2012-11-12 MED ORDER — APIXABAN 5 MG PO TABS: 5.0000 mg | ORAL_TABLET | Freq: Two times a day (BID) | ORAL | Status: DC

## 2012-11-14 ENCOUNTER — Other Ambulatory Visit: Payer: Medicare Other

## 2012-11-14 ENCOUNTER — Ambulatory Visit (INDEPENDENT_AMBULATORY_CARE_PROVIDER_SITE_OTHER): Payer: Medicare Other

## 2012-11-14 VITALS — Ht 72.0 in | Wt 250.5 lb

## 2012-11-14 DIAGNOSIS — R000 Tachycardia, unspecified: Secondary | ICD-10-CM

## 2012-11-14 DIAGNOSIS — I4891 Unspecified atrial fibrillation: Secondary | ICD-10-CM

## 2012-11-14 NOTE — Progress Notes (Signed)
Pt here for EKG pre cardioversion scheduled for 11/18/12 for atrial fib EKG shows possible NSR with a rate of 66 BPM Pt denies worsening sob or dizziness I told pt I would review EKG with Dr. Mariah Milling to confirm it is NSR and call him back with further instructions [id number] Understanding verb  EKG was reviewed with Dr. [name] "Cancel cardioversion since pt is in normal paced rhythm. Have him follow up with me next time I am in office" VO Dr. Anderson Malta, RN  Pt informed Understanding verb I will call him when we have updated schedule for Dr. [name] in April Hospital was also made aware

## 2012-11-14 NOTE — Patient Instructions (Addendum)
We will call you with further instructions [id number]

## 2012-11-15 LAB — CBC WITH DIFFERENTIAL
Basos: 1 % (ref 0–3)
Eosinophils Absolute: 0.2 10*3/uL (ref 0.0–0.4)
HCT: 42 % (ref 37.5–51.0)
Hemoglobin: 13.9 g/dL (ref 12.6–17.7)
Lymphocytes Absolute: 1.1 10*3/uL (ref 0.7–3.1)
Lymphs: 18 % (ref 14–46)
MCH: 29.9 pg (ref 26.6–33.0)
MCHC: 33.1 g/dL (ref 31.5–35.7)
MCV: 90 fL (ref 79–97)
Neutrophils Absolute: 4.1 10*3/uL (ref 1.4–7.0)

## 2012-11-15 LAB — BASIC METABOLIC PANEL
BUN/Creatinine Ratio: 13 (ref 10–22)
Calcium: 9.2 mg/dL (ref 8.6–10.2)
Creatinine, Ser: 2.86 mg/dL — ABNORMAL HIGH (ref 0.76–1.27)
GFR calc Af Amer: 24 mL/min/{1.73_m2} — ABNORMAL LOW (ref >59)
GFR calc non Af Amer: 21 mL/min/{1.73_m2} — ABNORMAL LOW (ref >59)
Sodium: 145 mmol/L — ABNORMAL HIGH (ref 134–144)

## 2012-11-15 LAB — PROTIME-INR: INR: 1.1 (ref 0.8–1.2)

## 2012-11-18 ENCOUNTER — Telehealth: Payer: Self-pay

## 2012-11-18 NOTE — Telephone Encounter (Signed)
Pt needs appt with Dr. [name], per Dr.Arida Dr. [name] mentioned adding days in April Do you know anything about this?

## 2012-11-18 NOTE — Telephone Encounter (Signed)
Marco [name],  Will you contact [name] to see when we can add-on patient. Thanks

## 2012-11-18 NOTE — Telephone Encounter (Signed)
Schedule appt with Dr. [name]

## 2012-11-21 ENCOUNTER — Telehealth: Payer: Self-pay

## 2012-11-21 NOTE — Telephone Encounter (Signed)
Spoke with Vladimir Crofts for prior authrorization for Eliquis 5 mg take one tablet twice a day has been approved from November 21, 2012 to November 21, 2013.

## 2012-11-22 ENCOUNTER — Encounter: Payer: Self-pay | Admitting: Pulmonary Disease

## 2012-11-22 ENCOUNTER — Ambulatory Visit (INDEPENDENT_AMBULATORY_CARE_PROVIDER_SITE_OTHER): Payer: Medicare Other | Admitting: Pulmonary Disease

## 2012-11-22 ENCOUNTER — Other Ambulatory Visit: Payer: Self-pay | Admitting: Pulmonary Disease

## 2012-11-22 ENCOUNTER — Telehealth: Payer: Self-pay | Admitting: Pulmonary Disease

## 2012-11-22 VITALS — BP 110/70 | HR 90 | Temp 98.7°F | Ht 72.0 in | Wt 269.2 lb

## 2012-11-22 DIAGNOSIS — G4733 Obstructive sleep apnea (adult) (pediatric): Secondary | ICD-10-CM

## 2012-11-22 NOTE — Telephone Encounter (Signed)
Kc pt said he does not have the old cpap has checked so i will need an order for new cpap Tobe Sos

## 2012-11-22 NOTE — Telephone Encounter (Signed)
done

## 2012-11-22 NOTE — Assessment & Plan Note (Signed)
The patient has a history of severe obstructive sleep apnea, and has been CPAP intolerant in the past.  Although he is a little better her sleeping in her recliner with oxygen, he continues to have significant sleep disruption at night and daytime sleepiness.  He also has significant underlying cardiovascular disease which can be greatly influenced by sleep disordered breathing.  His optimal treatment his CPAP, and the patient would like to try this again.  We'll see if we can do this with his old machine that really does not have a lot of hours of use.  I have also encouraged him to work aggressively on weight loss.

## 2012-11-22 NOTE — Patient Instructions (Addendum)
Will have advanced check your machine and make sure is in working order.  Will get you a full face mask to try. Would like to see you back in 6 weeks, but call me if having tolerance issues and will work on making these better.  Work on weight loss.  This is the key to curing your sleep apnea.

## 2012-11-22 NOTE — Progress Notes (Signed)
Subjective:    Patient ID: Marco [name], male    DOB: [birth year], 77 y.o.   MRN: [id number]  HPI The patient is a 77 year old male who been asked to see for management of obstructive sleep apnea.  He was initially diagnosed somewhere between 1999 and 2004, but a sleep study in 2004 did show an AHI of 59 events per hour with CPAP of 11 cm controlling his events.  I saw him at that time and started him on CPAP, however he could never wear the device consistently because of desensitization issues.  He ultimately was started on oxygen at night for his desaturations.  The patient currently sleeps in a recliner with his oxygen at home, but continues to have an abnormal breathing pattern during sleep and loud snoring.  He is not rested in the mornings upon arising, and will fall asleep easily during the day or night with inactivity.  His Epworth score today is abnormal at 10, and he states that his weight is up about 20 pounds over the last few years.   Sleep Questionnaire What time do you typically go to bed?( Between what hours) 11p-1am 11p-1am at 1119 on 11/22/12 by Nita Sells, CMA How long does it take you to fall asleep? within minutes within minutes at 1119 on 11/22/12 by Marjo Bicker Mabe, CMA How many times during the night do you wake up? 3 3 at 1119 on 11/22/12 by Nita Sells, CMA What time do you get out of bed to start your day? 0500 0500 at 1119 on 11/22/12 by Nita Sells, CMA Do you drive or operate heavy machinery in your occupation? No No at 1119 on 11/22/12 by Nita Sells, CMA How much has your weight changed (up or down) over the past two years? (In pounds) No Value Varies 5-20lbs in last 2 years.  at 1119 on 11/22/12 by Nita Sells, CMA Have you ever had a sleep study before? Yes Yes at 1119 on 11/22/12 by Nita Sells, CMA If yes, location of study? [facility] at 1119 on 11/22/12 by Nita Sells, CMA If yes, date of study? 2009 2009 at 1119 on 11/22/12 by  Nita Sells, CMA Do you currently use CPAP? No No at 1119 on 11/22/12 by Marjo Bicker Mabe, CMA Do you wear oxygen at any time? Yes Yes at 1119 on 11/22/12 by Marjo Bicker Mabe, CMA O2 Flow Rate (L/min) 1.5 L/min1.5 L/min uses at night at 1119 on 11/22/12 by Marjo Bicker Mabe, CMA   Review of Systems  Constitutional: Negative for fever and unexpected weight change.  HENT: Positive for congestion. Negative for ear pain, nosebleeds, sore throat, rhinorrhea, sneezing, trouble swallowing, dental problem, postnasal drip and sinus pressure.   Eyes: Negative for redness and itching.  Respiratory: Positive for shortness of breath. Negative for cough, chest tightness and wheezing.   Cardiovascular: Positive for palpitations ( irregular heartbeats) and leg swelling ( feet and hand swelling).  Gastrointestinal: Negative for nausea and vomiting.  Genitourinary: Negative for dysuria.  Musculoskeletal: Negative for joint swelling.  Skin: Negative for rash.       Bruising and swelling in right leg d/t recent fall.  Neurological: Negative for headaches.  Hematological: Does not bruise/bleed easily.  Psychiatric/Behavioral: Negative for dysphoric mood. The patient is not nervous/anxious.        Objective:   Physical Exam Constitutional:  Obese male, no acute distress  HENT:  Nares patent without discharge, but deviated septum to  left with narrowing.  Oropharynx without exudate, palate and uvula are thick and elongated.   Eyes:  Perrla, eomi, no scleral icterus  Neck:  No JVD, no TMG  Cardiovascular:  Normal rate, regular rhythm, no rubs or gallops.  No murmurs        Intact distal pulses but diminished.   Pulmonary :  Normal breath sounds, no stridor or respiratory distress   No rales, rhonchi, or wheezing  Abdominal:  Soft, nondistended, bowel sounds present.  No tenderness noted.   Musculoskeletal:  1+ lower extremity edema noted.  Lymph Nodes:  No cervical lymphadenopathy noted  Skin:  No  cyanosis noted  Neurologic:  Alert, appropriate, moves all 4 extremities without obvious deficit.         Assessment & Plan:

## 2012-11-25 NOTE — Telephone Encounter (Signed)
Spoke with patient he is aware order has been placed and that DME company will be getting in touch with him to get new machine. Patient verbalized understanding and nothing further needed at this time.

## 2012-11-26 ENCOUNTER — Other Ambulatory Visit: Payer: Self-pay | Admitting: Pulmonary Disease

## 2012-11-26 DIAGNOSIS — G4733 Obstructive sleep apnea (adult) (pediatric): Secondary | ICD-10-CM

## 2012-11-27 ENCOUNTER — Ambulatory Visit (INDEPENDENT_AMBULATORY_CARE_PROVIDER_SITE_OTHER): Payer: Medicare Other | Admitting: Pulmonary Disease

## 2012-11-27 DIAGNOSIS — G4733 Obstructive sleep apnea (adult) (pediatric): Secondary | ICD-10-CM

## 2012-12-04 ENCOUNTER — Telehealth: Payer: Self-pay | Admitting: Pulmonary Disease

## 2012-12-04 NOTE — Telephone Encounter (Signed)
Pt needs ov to review sleep study results.  

## 2012-12-04 NOTE — Telephone Encounter (Signed)
Spoke with patient--unable to schedule at the moment d/t not having his calendar in front of him. Will call back later today.

## 2012-12-11 NOTE — Telephone Encounter (Signed)
Pt has an appt on 12-16-12. Carron Curie, CMA

## 2012-12-16 ENCOUNTER — Ambulatory Visit (INDEPENDENT_AMBULATORY_CARE_PROVIDER_SITE_OTHER): Payer: Medicare Other | Admitting: Pulmonary Disease

## 2012-12-16 ENCOUNTER — Encounter: Payer: Self-pay | Admitting: Pulmonary Disease

## 2012-12-16 VITALS — BP 122/76 | HR 77 | Temp 98.0°F | Ht 72.0 in | Wt 276.0 lb

## 2012-12-16 DIAGNOSIS — G4733 Obstructive sleep apnea (adult) (pediatric): Secondary | ICD-10-CM

## 2012-12-16 NOTE — Patient Instructions (Addendum)
Will order your cpap machine, and have it set on an automatic setting for the first 3-4 weeks.  Can then get a download report from your machine, and let you know your optimal pressure.   Work on weight loss followup with me in 6 weeks, and bring your machine to the visit.

## 2012-12-16 NOTE — Progress Notes (Signed)
  Subjective:    Patient ID: Marco [name], male    DOB: December 23, 1935, 77 y.o.   MRN: [id number]  HPI The patient comes in today for followup of his obstructive sleep apnea.  He has a history of severe obstructive sleep apnea, and was started on CPAP at the last visit.  However, Medicare would not accept his older sleep study for diagnosis, and the patient had to undergo her sleep testing.  This showed an AHI of 21 minutes per hour with oxygen desaturation as low as 68%.  I have reviewed the study with him in detail, and answered all of his questions.  In the interim, he has borrowed a family members CPAP device, and feels that he did sleep better with it.  I have explained to him this is not a safe practice, since the machine was not adjusted for him.   Review of Systems  Constitutional: Negative for fever and unexpected weight change.  HENT: Positive for rhinorrhea, sneezing and postnasal drip. Negative for ear pain, nosebleeds, congestion, sore throat, trouble swallowing, dental problem and sinus pressure.   Eyes: Positive for itching. Negative for redness.  Respiratory: Negative for cough, chest tightness, shortness of breath and wheezing.   Cardiovascular: Positive for leg swelling. Negative for palpitations.  Gastrointestinal: Negative for nausea and vomiting.  Genitourinary: Negative for dysuria.  Musculoskeletal: Negative for joint swelling.  Skin: Negative for rash.  Neurological: Negative for headaches.  Hematological: Does not bruise/bleed easily.  Psychiatric/Behavioral: Negative for dysphoric mood. The patient is not nervous/anxious.        Objective:   Physical Exam Obese male in no acute distress Nose without purulence or discharge noted Neck without lymphadenopathy or thyromegaly Lower extremities with edema, no cyanosis awake, but appears to be mildly sleepy, moves all 4 extremities.       Assessment & Plan:

## 2012-12-16 NOTE — Assessment & Plan Note (Signed)
The patient has a reconfirmed diagnosis of significant obstructive sleep apnea, and will need to be started on CPAP for treatment.  I will set his device on the automatic settings initially, and we'll get a download for pressure optimization.  I have also encouraged the patient to work aggressively on weight loss.

## 2012-12-21 ENCOUNTER — Other Ambulatory Visit: Payer: Self-pay | Admitting: Cardiology

## 2012-12-23 ENCOUNTER — Encounter: Payer: Self-pay | Admitting: Cardiovascular Disease

## 2012-12-23 ENCOUNTER — Ambulatory Visit (INDEPENDENT_AMBULATORY_CARE_PROVIDER_SITE_OTHER): Payer: Medicare Other | Admitting: Cardiovascular Disease

## 2012-12-23 VITALS — BP 168/100 | HR 76 | Ht 72.0 in | Wt 273.5 lb

## 2012-12-23 DIAGNOSIS — I255 Ischemic cardiomyopathy: Secondary | ICD-10-CM

## 2012-12-23 DIAGNOSIS — I272 Pulmonary hypertension, unspecified: Secondary | ICD-10-CM

## 2012-12-23 DIAGNOSIS — I4892 Unspecified atrial flutter: Secondary | ICD-10-CM

## 2012-12-23 DIAGNOSIS — R0602 Shortness of breath: Secondary | ICD-10-CM

## 2012-12-23 DIAGNOSIS — I10 Essential (primary) hypertension: Secondary | ICD-10-CM

## 2012-12-23 DIAGNOSIS — I2789 Other specified pulmonary heart diseases: Secondary | ICD-10-CM

## 2012-12-23 DIAGNOSIS — G4733 Obstructive sleep apnea (adult) (pediatric): Secondary | ICD-10-CM

## 2012-12-23 DIAGNOSIS — I2589 Other forms of chronic ischemic heart disease: Secondary | ICD-10-CM

## 2012-12-23 MED ORDER — HYDRALAZINE HCL 25 MG PO TABS: 25.0000 mg | ORAL_TABLET | Freq: Four times a day (QID) | ORAL | Status: DC

## 2012-12-23 NOTE — Progress Notes (Signed)
Patient ID: Marco [name], male    DOB: [birth year], 77 y.o.   MRN: [id number]  HPI Comments: Marco [name] is a very pleasant 77 year old gentleman with a history of coronary artery disease, CABG, atrial fibrillation and DVT, on warfarin, diabetes, previous smoking history PVD, carotid endarterectomy, sick sinus syndrome who recently presented to the hospital at [facility] after being seen by Dr. Willeen Cass of ear nose throat with increasing shortness of breath, oxygen saturations to the mid 80s on room air. Cardiology was consulted for systolic and diastolic CHF.   He has LIMA to the LAD, vein graft to the circumflex, vein graft to the PL in June 2006 Also has mitral valve her care with #28 Edwards logic ring Pacemaker placement AAA repair May 2008   He continues to take Lasix 40 mg twice a day with extra dose of Lasix as needed for edema. He wears compression hose. Right leg worse than the left leg, mild symptoms.  No significant shortness of breath beyond his baseline. He reports that his blood pressure is typically well-controlled. Hemoglobin A1c of 8 Overall he reports that he is doing well. No new complaints. No chest pain or worsening shortness of breath. Does not walk or do any regular exercise. Weight continues to trend high.  Renal function in July 2013 showed creatinine 2.9. He is seen by Dr. Caryn Section of [place] [facility]. Last seen August 2013. No labs at that time per the patient.  Echocardiogram in the hospital 03/12/2012 showed moderate right ventricular pressures 50-60 mmHg consistent with moderate pulmonary hypertension, ejection fraction 30-35%, moderately dilated left atrium, dilated left ventricle.  EKG shows  paced rhythm with ventricular rate 66 beats per minute   Outpatient Encounter Prescriptions as of 12/23/2012  Medication Sig Dispense Refill  . amiodarone (PACERONE) 200 MG tablet Take 1 tablet (200 mg total) by mouth daily.  30 tablet  6  . apixaban (ELIQUIS) 5 MG TABS tablet Take 1  tablet (5 mg total) by mouth 2 (two) times daily.  60 tablet  0  . aspirin 81 MG tablet Take 81 mg by mouth daily.        [name] azelastine (ASTELIN) 137 MCG/SPRAY nasal spray Inhale 2 sprays into the lungs at bedtime.       . calcitRIOL (ROCALTROL) 0.5 MCG capsule Take 0.5 mcg by mouth daily.      . carvedilol (COREG) 12.5 MG tablet Take 12.5 mg by mouth 2 (two) times daily with a meal.      . docusate sodium (COLACE) 100 MG capsule Take 100 mg by mouth 2 (two) times daily.        . furosemide (LASIX) 40 MG tablet Take 40 mg by mouth 2 (two) times daily.        . isosorbide dinitrate (ISORDIL) 30 MG tablet TAKE 1 TABLET BY MOUTH TWICE A DAY  60 tablet  6  . NON FORMULARY 1.5 liters      . NOVOLOG MIX 70/30 (70-30) 100 UNIT/ML injection Inject 15 Units into the skin 2 (two) times daily with a meal. As directed.      [name] omeprazole (PRILOSEC) 20 MG capsule Take 20 mg by mouth every Monday, Wednesday, and Friday. Taking 3 times a week.      [name] PATADAY 0.2 % SOLN Apply 1 drop topically daily as needed. Allergies      . rosuvastatin (CRESTOR) 10 MG tablet Take 10 mg by mouth daily.        . Tamsulosin HCl (  FLOMAX) 0.4 MG CAPS Take 0.4 mg by mouth at bedtime. 1 po daily      .  hydrALAZINE (APRESOLINE) 25 MG tablet Take 12.5 mg by mouth 2 (two) times daily.       No facility-administered encounter medications on file as of 12/23/2012.      Review of Systems  HENT: Negative.   Eyes: Negative.   Respiratory: Positive for shortness of breath.   Cardiovascular: Negative.   Gastrointestinal: Negative.   Musculoskeletal: Negative.   Skin: Negative.   Neurological: Negative.   Psychiatric/Behavioral: Negative.   All other systems reviewed and are negative.    BP 168/100  Pulse 76  Ht 6' (1.829 m)  Wt 273 lb 8 oz (124.059 kg)  BMI 37.09 kg/m2  Physical Exam  Nursing note and vitals reviewed. Constitutional: He is oriented to person, place, and time. He appears well-developed and well-nourished.   Obese  HENT:  Head: Normocephalic.  Nose: Nose normal.  Mouth/Throat: Oropharynx is clear and moist.  Eyes: Conjunctivae are normal. Pupils are equal, round, and reactive to light.  Neck: Normal range of motion. Neck supple. No JVD present.  Cardiovascular: Normal rate, regular rhythm, S1 normal, S2 normal, normal heart sounds and intact distal pulses.  Exam reveals no gallop and no friction rub.   No murmur heard. Pulmonary/Chest: Effort normal and breath sounds normal. No respiratory distress. He has no wheezes. He has no rales. He exhibits no tenderness.  Abdominal: Soft. Bowel sounds are normal. He exhibits no distension. There is no tenderness.  Musculoskeletal: Normal range of motion. He exhibits no edema and no tenderness.  Lymphadenopathy:    He has no cervical adenopathy.  Neurological: He is alert and oriented to person, place, and time. Coordination normal.  Skin: Skin is warm and dry. No rash noted. No erythema.  Psychiatric: He has a normal mood and affect. His behavior is normal. Judgment and thought content normal.      Assessment and Plan

## 2012-12-23 NOTE — Assessment & Plan Note (Signed)
Currently with no symptoms of angina. No further workup at this time. Continue current medication regimen. 

## 2012-12-23 NOTE — Assessment & Plan Note (Signed)
Tolerating CPAP which he started last week. Still sleeping short periods of time

## 2012-12-23 NOTE — Assessment & Plan Note (Signed)
Blood pressure is high today. We have suggested he increase his hydralazine to 25 mg 3 times a day. This can be increased further. He will monitor his blood pressure at home. May need 50 mg 3 times a day.

## 2012-12-23 NOTE — Assessment & Plan Note (Signed)
Weight continues to be a major issue.  We have encouraged continued exercise, careful diet management in an effort to lose weight.  

## 2012-12-23 NOTE — Patient Instructions (Addendum)
You are doing well. Please take hydralazine three times a day, 25 mg pill  Ask for Dr. Cherylann Ratel and Dr. Thedore Mins for renal doctor  Please call us if you have new issues that need to be addressed before your next appt.  Your physician wants you to follow-up in: 6 months.  You will receive a reminder letter in the mail two months in advance. If you don't receive a letter, please call our office to schedule the follow-up appointment.

## 2012-12-23 NOTE — Assessment & Plan Note (Signed)
We'll continue current Lasix dose. Minimal edema of the lower extremities.

## 2013-01-03 ENCOUNTER — Ambulatory Visit: Payer: Medicare Other | Admitting: Pulmonary Disease

## 2013-01-13 ENCOUNTER — Ambulatory Visit: Payer: Self-pay | Admitting: Family Medicine

## 2013-01-16 ENCOUNTER — Other Ambulatory Visit: Payer: Self-pay | Admitting: *Deleted

## 2013-01-16 ENCOUNTER — Encounter: Payer: Self-pay | Admitting: Internal Medicine

## 2013-01-16 ENCOUNTER — Ambulatory Visit (INDEPENDENT_AMBULATORY_CARE_PROVIDER_SITE_OTHER): Payer: Medicare Other | Admitting: Internal Medicine

## 2013-01-16 VITALS — BP 162/92 | HR 67 | Ht 72.0 in | Wt 276.0 lb

## 2013-01-16 DIAGNOSIS — I2510 Atherosclerotic heart disease of native coronary artery without angina pectoris: Secondary | ICD-10-CM

## 2013-01-16 DIAGNOSIS — I4892 Unspecified atrial flutter: Secondary | ICD-10-CM

## 2013-01-16 MED ORDER — APIXABAN 5 MG PO TABS: 5.0000 mg | ORAL_TABLET | Freq: Two times a day (BID) | ORAL | Status: DC

## 2013-01-16 NOTE — Assessment & Plan Note (Signed)
Stable. We'll continue current medications. We'll stop aspirin as he is on apixaban

## 2013-01-16 NOTE — Assessment & Plan Note (Signed)
The patient's device was interrogated.  The information was reviewed. No changes were made in the programming.    

## 2013-01-16 NOTE — Patient Instructions (Signed)
Your physician has recommended you make the following change in your medication:  1) Stop aspirin 2) Change carvedilol to 12.5 mg 1/2 tablet in the morning and 1 & 1/2 tablets at bedtime. 3) Decrease hydralazine to 25 mg one tablet by mouth three times a day 4) Change isosorbide to 30 mg 1/2 tablet in the morning and 1 tablet at bedtime.  Remote monitoring is used to monitor your Pacemaker of ICD from home. This monitoring reduces the number of office visits required to check your device to one time per year. It allows [place] to keep an eye on the functioning of your device to ensure it is working properly. You are scheduled for a device check from home on 04/21/13. You may send your transmission at any time that day. If you have a wireless device, the transmission will be sent automatically. After your physician reviews your transmission, you will receive a postcard with your next transmission date.  Your physician wants you to follow-up in: 1 year. You will receive a reminder letter in the mail two months in advance. If you don't receive a letter, please call our office to schedule the follow-up appointment.

## 2013-01-16 NOTE — Progress Notes (Signed)
Patient Care Team: [name], MD as PCP - General (Unknown Physician Specialty) [name], MD (Cardiology)   HPI  Marco [name] is a 77 y.o. male Seen in followup for atrial arrhythmia as well as for    pacemaker implantatedwith a Medtronic dual-chamber device for sick sinus syndrome.[name] He was seen in February urgently because of symptomatic tachypalpitations. He underwent cardioversion TE support.     He's had recent problems with labile blood pressures with numbers recorded as high as 190 as low as 80 systolic. He saw Dr. Sullivan Lone who made some modifications recently.   He also has a history of coronary artery disease with prior stenting and bypass surgery with mitral valve repair.   Most recent ejection fraction at the time of TEE 2/14--25-30% with evidence of pulmonary hypertension and biatrial enlargement he        Past Medical History  Diagnosis Date  . Cardiomyopathy -mixed     EF 30-35% -- 7/13; Pulm HTN and modest RV dysfunction  . Coronary artery disease      The patient had stent to the right coronary artery in 1999.  The patient had unstable angina.  Non-Q-wave myocardial infarction in June 2006.  Coronary artery bypass graft surgery on February 01, 2005, with  mitral valve annuloplasty with   . Sick sinus syndrome   . Mitral regurgitation--s/p repair     placement of a 28-mm Edwards   ETlogix ring.     Marco [name]      Generator replacement 10/13  . Diabetes mellitus   . Hypertension   . Hyperlipidemia   . BPH (benign prostatic hypertrophy)   . Renal insufficiency Gd 4   . GERD (gastroesophageal reflux disease)   . Chronic systolic heart failure     New York Heart Association class II-III   . Obesity   . DVT (deep venous thrombosis)   . Anemia   . Sleep apnea     Not tolerant of CPAP  . Skin cancer 2013    [name] x 2 back and left neck  . AAA (abdominal aortic aneurysm)repaired 2008    repair  . Atrial fibrillation/Flutter      newly identified 2014  . PVC (premature ventricular contraction)     s/p RFCA JAllred 2011    Past Surgical History  Procedure Laterality Date  . Coronary artery bypass graft      CABG with a  LIMA to the LAD, SVG to circumflex, SVG to posterolateral June2006.  . Mitral valve repair  2006     mitral valve repair with a #28 Edwards logic ring  . Pacemaker placement    . Abdominal aortic aneurysm repair       May 2008,   . Cardiac catheterization  2006    @ [facility]  . Insert / replace / remove pacemaker  2006    Dr. [name]  . Skin cancer excision       x 6  . Cataract extraction      right  . Skin biopsy      left wrist; right elbow  . Transesophageal echocardiogram      Current Outpatient Prescriptions  Medication Sig Dispense Refill  . amiodarone (PACERONE) 200 MG tablet Take 1 tablet (200 mg total) by mouth daily.  30 tablet  6  . apixaban (ELIQUIS) 5 MG TABS tablet Take 1 tablet (5 mg total) by mouth 2 (two) times daily.  60 tablet  3  . aspirin 81 MG  tablet Take 81 mg by mouth daily.        [name] azelastine (ASTELIN) 137 MCG/SPRAY nasal spray Inhale 2 sprays into the lungs at bedtime.       . calcitRIOL (ROCALTROL) 0.5 MCG capsule Take 0.5 mcg by mouth daily.      . carvedilol (COREG) 12.5 MG tablet Take 12.5 mg by mouth 2 (two) times daily with a meal.      . carvedilol (COREG) 12.5 MG tablet take 1 tablet by mouth twice a day  60 tablet  5  . docusate sodium (COLACE) 100 MG capsule Take 100 mg by mouth 2 (two) times daily.        . furosemide (LASIX) 40 MG tablet Take 40 mg by mouth 2 (two) times daily.        . hydrALAZINE (APRESOLINE) 25 MG tablet Take 1 tablet (25 mg total) by mouth 4 (four) times daily.  120 tablet  6  . isosorbide dinitrate (ISORDIL) 30 MG tablet TAKE 1 TABLET BY MOUTH TWICE A DAY  60 tablet  6  . NON FORMULARY 1.5 liters      . NOVOLOG MIX 70/30 (70-30) 100 UNIT/ML injection Inject 15 Units into the skin 2 (two) times daily with a meal. As directed.       [name] omeprazole (PRILOSEC) 20 MG capsule Take 20 mg by mouth every Monday, Wednesday, and Friday. Taking 3 times a week.      [name] PATADAY 0.2 % SOLN Apply 1 drop topically daily as needed. Allergies      . rosuvastatin (CRESTOR) 10 MG tablet Take 10 mg by mouth daily.        . Tamsulosin HCl (FLOMAX) 0.4 MG CAPS Take 0.4 mg by mouth at bedtime. 1 po daily       No current facility-administered medications for this visit.    Allergies  Allergen Reactions  . Shellfish Allergy Hives    Review of Systems negative except from HPI and PMH  Physical Exam BP 162/92  Pulse 67  Ht 6' (1.829 m)  Wt 276 lb (125.193 kg)  BMI 37.42 kg/m2 Well developed and morbidly obese in no acute distress HENT normal E scleral and icterus clear Neck Supple JVP flat; carotids brisk and full Clear to ausculation 2Regular rate and rhythm, no murmurs gallops or rub Soft with active bowel sounds No clubbing cyanosis 2+ Edema wearing support stockings Alert and oriented, grossly normal motor and sensory function Skin Warm and Dry    Assessment and  Plan

## 2013-01-16 NOTE — Assessment & Plan Note (Signed)
The no recurrent atrial arrhythmias

## 2013-01-16 NOTE — Assessment & Plan Note (Signed)
Labile and poorly controlled blood pressure. I most worried about recumbent supine hypertension and so we will adjust his medications with that in mind. We'll have him take his carvedilol 6.25/18.75. He'll take his isosorbide 30/60  We'll have him take his hydralazine 3 times a day instead of 4 times a day. He'll be following up with nephrology as well.

## 2013-01-28 ENCOUNTER — Ambulatory Visit: Payer: Medicare Other | Admitting: Pulmonary Disease

## 2013-02-06 LAB — PACEMAKER DEVICE OBSERVATION
AL THRESHOLD: 0.5 V
ATRIAL PACING PM: 81
BAMS-0001: 150 {beats}/min
RV LEAD THRESHOLD: 1 V

## 2013-02-07 ENCOUNTER — Ambulatory Visit: Payer: Medicare Other | Admitting: Neurosurgery

## 2013-02-07 ENCOUNTER — Other Ambulatory Visit: Payer: Medicare Other

## 2013-02-11 ENCOUNTER — Ambulatory Visit: Payer: Medicare Other | Admitting: Pulmonary Disease

## 2013-02-14 ENCOUNTER — Other Ambulatory Visit: Payer: Self-pay | Admitting: *Deleted

## 2013-02-14 DIAGNOSIS — Z48812 Encounter for surgical aftercare following surgery on the circulatory system: Secondary | ICD-10-CM

## 2013-02-20 ENCOUNTER — Other Ambulatory Visit (INDEPENDENT_AMBULATORY_CARE_PROVIDER_SITE_OTHER): Payer: Medicare Other | Admitting: *Deleted

## 2013-02-20 DIAGNOSIS — I6529 Occlusion and stenosis of unspecified carotid artery: Secondary | ICD-10-CM

## 2013-02-20 DIAGNOSIS — Z48812 Encounter for surgical aftercare following surgery on the circulatory system: Secondary | ICD-10-CM

## 2013-02-24 ENCOUNTER — Other Ambulatory Visit: Payer: Self-pay | Admitting: *Deleted

## 2013-02-24 DIAGNOSIS — Z48812 Encounter for surgical aftercare following surgery on the circulatory system: Secondary | ICD-10-CM

## 2013-02-26 ENCOUNTER — Ambulatory Visit: Payer: Medicare Other | Admitting: Pulmonary Disease

## 2013-03-06 ENCOUNTER — Ambulatory Visit: Payer: Medicare Other | Admitting: Pulmonary Disease

## 2013-03-06 ENCOUNTER — Encounter: Payer: Self-pay | Admitting: Vascular Surgery

## 2013-03-09 ENCOUNTER — Other Ambulatory Visit: Payer: Self-pay | Admitting: Pulmonary Disease

## 2013-03-09 DIAGNOSIS — G4733 Obstructive sleep apnea (adult) (pediatric): Secondary | ICD-10-CM

## 2013-04-16 ENCOUNTER — Ambulatory Visit (INDEPENDENT_AMBULATORY_CARE_PROVIDER_SITE_OTHER): Payer: Medicare Other | Admitting: Pulmonary Disease

## 2013-04-16 ENCOUNTER — Encounter: Payer: Self-pay | Admitting: Pulmonary Disease

## 2013-04-16 VITALS — BP 120/64 | HR 74 | Temp 98.4°F | Ht 72.0 in | Wt 274.6 lb

## 2013-04-16 DIAGNOSIS — G4733 Obstructive sleep apnea (adult) (pediatric): Secondary | ICD-10-CM

## 2013-04-16 NOTE — Progress Notes (Signed)
  Subjective:    Patient ID: Marco [name], male    DOB: [birth year], 77 y.o.   MRN: [id number]  HPI Patient comes in today for followup of his obstructive sleep apnea.  He has not been able to wear CPAP the last 2 weeks, and he feels that it has not been helping him.  He has no issues with the pressure or the mask, but has been having nasal dryness and epistaxis that he thinks is from the CPAP.  He is using the humidifier, but has not been adjusting the heat.  He has been putting Vicks vapor rub into his nose, and I have counseled him against this.  The patient feels that he can no longer wear CPAP, and is really not willing to continue trying   Review of Systems  Constitutional: Negative for fever and unexpected weight change.  HENT: Positive for nosebleeds. Negative for ear pain, congestion, sore throat, rhinorrhea, sneezing, trouble swallowing, dental problem, postnasal drip and sinus pressure.   Eyes: Negative for redness and itching.  Respiratory: Negative for cough, chest tightness, shortness of breath and wheezing.   Cardiovascular: Negative for palpitations and leg swelling.  Gastrointestinal: Negative for nausea and vomiting.  Genitourinary: Negative for dysuria.  Musculoskeletal: Negative for joint swelling.  Skin: Negative for rash.  Neurological: Negative for headaches.  Hematological: Does not bruise/bleed easily.  Psychiatric/Behavioral: Negative for dysphoric mood. The patient is not nervous/anxious.        Objective:   Physical Exam Obese male in no acute distress Nose without purulent discharge noted No skin breakdown or pressure necrosis from CPAP mask Neck without lymphadenopathy or thyromegaly Lower extremities mild edema, no cyanosis Awake but appears sleepy, moves all 4 extremities.       Assessment & Plan:

## 2013-04-16 NOTE — Assessment & Plan Note (Signed)
The patient feels that he is not able to tolerate CPAP, and wishes to discontinue.  He is not willing to work on desensitization or troubleshooting.  He asked that we discontinue the CPAP device.  I have asked him to call me if he changes his mind and wishes to try CPAP again.

## 2013-04-16 NOTE — Patient Instructions (Addendum)
Will send an order to advance to discontinue your cpap Work on weight loss Please call if you change your mind and would like to try cpap again.

## 2013-04-21 ENCOUNTER — Ambulatory Visit (INDEPENDENT_AMBULATORY_CARE_PROVIDER_SITE_OTHER): Payer: Medicare Other | Admitting: *Deleted

## 2013-04-21 DIAGNOSIS — I495 Sick sinus syndrome: Secondary | ICD-10-CM

## 2013-04-21 DIAGNOSIS — Z950 Presence of cardiac pacemaker: Secondary | ICD-10-CM

## 2013-05-01 ENCOUNTER — Ambulatory Visit: Payer: Self-pay | Admitting: Ophthalmology

## 2013-05-02 LAB — REMOTE PACEMAKER DEVICE
AL THRESHOLD: 0.5 V
ATRIAL PACING PM: 99
BAMS-0001: 150 {beats}/min
RV LEAD IMPEDENCE PM: 438 Ohm
RV LEAD THRESHOLD: 1 V

## 2013-05-12 NOTE — Progress Notes (Signed)
ppm remote check. All functions normal, full details in P[employer].  ROV w/ Dr. Tonye Becket 06/10/13

## 2013-05-21 ENCOUNTER — Encounter: Payer: Self-pay | Admitting: *Deleted

## 2013-05-29 ENCOUNTER — Encounter: Payer: Self-pay | Admitting: Internal Medicine

## 2013-05-29 LAB — CBC AND DIFFERENTIAL
HCT: 36 % — AB (ref 41–53)
HEMOGLOBIN: 12.3 g/dL — AB (ref 13.5–17.5)
NEUTROS ABS: 68 /uL
Platelets: 198 10*3/uL (ref 150–399)
WBC: 7.4 10^3/mL

## 2013-06-07 ENCOUNTER — Other Ambulatory Visit: Payer: Self-pay | Admitting: Cardiology

## 2013-06-10 ENCOUNTER — Encounter: Payer: Self-pay | Admitting: Internal Medicine

## 2013-06-10 ENCOUNTER — Encounter (INDEPENDENT_AMBULATORY_CARE_PROVIDER_SITE_OTHER): Payer: Self-pay

## 2013-06-10 ENCOUNTER — Ambulatory Visit (INDEPENDENT_AMBULATORY_CARE_PROVIDER_SITE_OTHER): Payer: Medicare Other | Admitting: Internal Medicine

## 2013-06-10 ENCOUNTER — Telehealth: Payer: Self-pay | Admitting: *Deleted

## 2013-06-10 VITALS — BP 82/62 | HR 72 | Ht 72.0 in | Wt 273.5 lb

## 2013-06-10 DIAGNOSIS — I951 Orthostatic hypotension: Secondary | ICD-10-CM

## 2013-06-10 DIAGNOSIS — I5043 Acute on chronic combined systolic (congestive) and diastolic (congestive) heart failure: Secondary | ICD-10-CM | POA: Insufficient documentation

## 2013-06-10 DIAGNOSIS — I2510 Atherosclerotic heart disease of native coronary artery without angina pectoris: Secondary | ICD-10-CM

## 2013-06-10 DIAGNOSIS — N184 Chronic kidney disease, stage 4 (severe): Secondary | ICD-10-CM

## 2013-06-10 DIAGNOSIS — Z45018 Encounter for adjustment and management of other part of cardiac pacemaker: Secondary | ICD-10-CM

## 2013-06-10 DIAGNOSIS — Z950 Presence of cardiac pacemaker: Secondary | ICD-10-CM

## 2013-06-10 DIAGNOSIS — I5022 Chronic systolic (congestive) heart failure: Secondary | ICD-10-CM

## 2013-06-10 DIAGNOSIS — I495 Sick sinus syndrome: Secondary | ICD-10-CM

## 2013-06-10 DIAGNOSIS — Z7189 Other specified counseling: Secondary | ICD-10-CM | POA: Insufficient documentation

## 2013-06-10 DIAGNOSIS — E875 Hyperkalemia: Secondary | ICD-10-CM

## 2013-06-10 DIAGNOSIS — I255 Ischemic cardiomyopathy: Secondary | ICD-10-CM

## 2013-06-10 DIAGNOSIS — I4892 Unspecified atrial flutter: Secondary | ICD-10-CM

## 2013-06-10 DIAGNOSIS — E1129 Type 2 diabetes mellitus with other diabetic kidney complication: Secondary | ICD-10-CM

## 2013-06-10 DIAGNOSIS — E1122 Type 2 diabetes mellitus with diabetic chronic kidney disease: Secondary | ICD-10-CM

## 2013-06-10 DIAGNOSIS — I2589 Other forms of chronic ischemic heart disease: Secondary | ICD-10-CM

## 2013-06-10 LAB — PACEMAKER DEVICE OBSERVATION
AL IMPEDENCE PM: 473 Ohm
BAMS-0001: 150 {beats}/min
RV LEAD AMPLITUDE: 22.4 mv
RV LEAD IMPEDENCE PM: 438 Ohm
VENTRICULAR PACING PM: 1

## 2013-06-10 MED ORDER — CARVEDILOL 3.125 MG PO TABS: ORAL_TABLET | ORAL | Status: DC

## 2013-06-10 NOTE — Assessment & Plan Note (Signed)
As above.

## 2013-06-10 NOTE — Assessment & Plan Note (Signed)
As above No angina

## 2013-06-10 NOTE — Assessment & Plan Note (Signed)
He is modestly volume overloaded however, because of hypotension I think we need to back off on afterload and then see how he does with diuresis. This can also adversely affect renal function

## 2013-06-10 NOTE — Assessment & Plan Note (Signed)
No intercurrent Ventricular tachycardia  

## 2013-06-10 NOTE — Assessment & Plan Note (Signed)
The patient's device was interrogated.  The information was reviewed. No changes were made in the programming.    

## 2013-06-10 NOTE — Assessment & Plan Note (Signed)
The patient has severe hypotension. This may be aggravated by the recent introduction of enalapril. I'm trying to get up with his nephrologist. I'm concerned of hyperkalemia and we will stop the enalapril today. Furthermore, we will down titrate his carvedilol as well as his hydralazine.

## 2013-06-10 NOTE — Assessment & Plan Note (Signed)
As above We will check his potassium today

## 2013-06-10 NOTE — Telephone Encounter (Signed)
Error

## 2013-06-10 NOTE — Progress Notes (Signed)
Patient Care Team: [name], MD as PCP - General (Unknown Physician Specialty) [name], MD (Cardiology)   HPI  Marco [name] is a 77 y.o. male Seen in followup for atrial arrhythmia as well as for pacemaker implantatedwith a Medtronic dual-chamber device for sick sinus syndrome.[name] He was seen in February urgently because of symptomatic tachypalpitations. He underwent cardioversion with TE .  Most recent ejection fraction at the time of TEE 2/14--25-30% with evidence of pulmonary hypertension and biatrial enlargement   He's had  t problems with labile blood pressure  And at last visit we adjusted his meds to take the bulk in the evening   He also has a history of coronary artery disease with prior stenting and bypass surgery with mitral valve repair.   He recently had enalapril initiated  K 9/14 >>5.7 and Cr 3.14, the latter stable the former up from 5.0  He has hd significant low blood pressure which limits his ability to walk and lesion wheelchair-bound.     Past Medical History  Diagnosis Date  . Cardiomyopathy -mixed     EF 30-35% -- 7/13; Pulm HTN and modest RV dysfunction  . Coronary artery disease      The patient had stent to the right coronary artery in 1999.  The patient had unstable angina.  Non-Q-wave myocardial infarction in June 2006.  Coronary artery bypass graft surgery on February 01, 2005, with  mitral valve annuloplasty with   . Sick sinus syndrome   . Mitral regurgitation--s/p repair     placement of a 28-mm Edwards   ETlogix ring.     Marco [name]      Generator replacement 10/13  . Diabetes mellitus   . Hypertension   . Hyperlipidemia   . BPH (benign prostatic hypertrophy)   . Renal insufficiency Gd 4   . GERD (gastroesophageal reflux disease)   . Chronic systolic heart failure     New York Heart Association class II-III   . Obesity   . DVT (deep venous thrombosis)   . Anemia   . Sleep apnea     Not tolerant of CPAP  . Skin  cancer 2013    [name] x 2 back and left neck  . AAA (abdominal aortic aneurysm)repaired 2008    repair  . Atrial fibrillation/Flutter     newly identified 2014  . PVC (premature ventricular contraction)     s/p RFCA JAllred 2011    Past Surgical History  Procedure Laterality Date  . Coronary artery bypass graft      CABG with a  LIMA to the LAD, SVG to circumflex, SVG to posterolateral June2006.  . Mitral valve repair  2006     mitral valve repair with a #28 Edwards logic ring  . Pacemaker placement    . Abdominal aortic aneurysm repair       May 2008,   . Cardiac catheterization  2006    @ [facility]  . Insert / replace / remove pacemaker  2006    Dr. [name]  . Skin cancer excision       x 6  . Cataract extraction      right  . Skin biopsy      left wrist; right elbow  . Transesophageal echocardiogram    . Skin cancer excision      Current Outpatient Prescriptions  Medication Sig Dispense Refill  . amiodarone (PACERONE) 200 MG tablet Take 1 tablet (200 mg total) by  mouth daily.  30 tablet  6  . apixaban (ELIQUIS) 5 MG TABS tablet Take 1 tablet (5 mg total) by mouth 2 (two) times daily.  60 tablet  11  . azelastine (ASTELIN) 137 MCG/SPRAY nasal spray Inhale 2 sprays into the lungs 3 times/day as needed-between meals & bedtime.       . calcitRIOL (ROCALTROL) 0.5 MCG capsule Take 0.5 mcg by mouth daily.      . carvedilol (COREG) 12.5 MG tablet Take 1/2 tablet in the morning and 1 &1/2 tablets in the evening      . docusate sodium (COLACE) 100 MG capsule Take 100 mg by mouth 2 (two) times daily.        . enalapril (VASOTEC) 2.5 MG tablet Take 2.5 mg by mouth daily.       . furosemide (LASIX) 40 MG tablet Take 40 mg by mouth 2 (two) times daily.        . hydrALAZINE (APRESOLINE) 25 MG tablet Take 25 mg by mouth 2 (two) times daily.       . isosorbide dinitrate (ISORDIL) 30 MG tablet Take 1/2 tablet in the morning and 1 tablet at bedtime.  45 tablet  1  . NON FORMULARY 1.5  liters      . NOVOLOG MIX 70/30 (70-30) 100 UNIT/ML injection Inject 15 Units into the skin 2 (two) times daily with a meal. As directed.      [name] omeprazole (PRILOSEC) 20 MG capsule Take 20 mg by mouth every Monday, Wednesday, and Friday. Taking 3 times a week.      . rosuvastatin (CRESTOR) 10 MG tablet Take 10 mg by mouth daily.        . Tamsulosin HCl (FLOMAX) 0.4 MG CAPS Take 0.4 mg by mouth at bedtime. 1 po daily       No current facility-administered medications for this visit.    Allergies  Allergen Reactions  . Shellfish Allergy Hives    Review of Systems negative except from HPI and PMH  Physical Exam BP 82/62  Pulse 72  Ht 6' (1.829 m)  Wt 273 lb 8 oz (124.059 kg)  BMI 37.09 kg/m2 Well developed and morbildy obese in no acute distress HENT normal E scleral and icterus clear Neck Supple Clear to ausculation  Regular rate and rhythm, no murmurs gallops or rub Soft with active bowel sounds No clubbing cyanosis 2+ Edema Alert and oriented, sitting in a wheel chairgrossly normal motor and sensory function Skin Warm and Dry  ECG demonstrates atrial pacing with intrinsic conduction  Assessment and  Plan

## 2013-06-10 NOTE — Patient Instructions (Addendum)
Your physician has recommended you make the following change in your medication:  1) Decrease coreg (carvedilol) to 3.125 mg in the morning and 6.25 mg in the evening 2) Decrease hydralazine to 25 mg 1/2 tablet by mouth twice daily 3) Stop enalapril  Your physician recommends that you have lab work today: bmp/liver/tsh  Remote monitoring is used to monitor your Pacemaker of ICD from home. This monitoring reduces the number of office visits required to check your device to one time per year. It allows [place] to keep an eye on the functioning of your device to ensure it is working properly. You are scheduled for a device check from home on 09/10/13. You may send your transmission at any time that day. If you have a wireless device, the transmission will be sent automatically. After your physician reviews your transmission, you will receive a postcard with your next transmission date.  Your physician wants you to follow-up in: 6 months with Dr. [name]. You will receive a reminder letter in the mail two months in advance. If you don't receive a letter, please call our office to schedule the follow-up appointment.

## 2013-06-11 LAB — HEPATIC FUNCTION PANEL
ALT: 11 IU/L (ref 0–44)
Albumin: 3.5 g/dL (ref 3.5–4.8)
Alkaline Phosphatase: 49 IU/L (ref 39–117)
Total Bilirubin: 0.2 mg/dL (ref 0.0–1.2)
Total Protein: 6.4 g/dL (ref 6.0–8.5)

## 2013-06-11 LAB — BASIC METABOLIC PANEL
BUN/Creatinine Ratio: 17 (ref 10–22)
BUN: 59 mg/dL — ABNORMAL HIGH (ref 8–27)
CO2: 23 mmol/L (ref 18–29)
Creatinine, Ser: 3.55 mg/dL — ABNORMAL HIGH (ref 0.76–1.27)
GFR calc Af Amer: 18 mL/min/{1.73_m2} — ABNORMAL LOW (ref >59)
Sodium: 140 mmol/L (ref 134–144)

## 2013-06-11 LAB — TSH: TSH: 2.5 u[IU]/mL (ref 0.450–4.500)

## 2013-06-13 ENCOUNTER — Other Ambulatory Visit: Payer: Self-pay | Admitting: *Deleted

## 2013-06-13 DIAGNOSIS — R7989 Other specified abnormal findings of blood chemistry: Secondary | ICD-10-CM

## 2013-06-13 DIAGNOSIS — IMO0002 Reserved for concepts with insufficient information to code with codable children: Secondary | ICD-10-CM

## 2013-06-13 MED ORDER — FUROSEMIDE 40 MG PO TABS: 40.0000 mg | ORAL_TABLET | Freq: Every day | ORAL | Status: DC

## 2013-06-20 ENCOUNTER — Other Ambulatory Visit: Payer: Self-pay | Admitting: Cardiology

## 2013-06-20 ENCOUNTER — Ambulatory Visit (INDEPENDENT_AMBULATORY_CARE_PROVIDER_SITE_OTHER): Payer: Medicare Other

## 2013-06-20 ENCOUNTER — Other Ambulatory Visit: Payer: Medicare Other

## 2013-06-20 DIAGNOSIS — R7989 Other specified abnormal findings of blood chemistry: Secondary | ICD-10-CM

## 2013-06-20 DIAGNOSIS — R799 Abnormal finding of blood chemistry, unspecified: Secondary | ICD-10-CM

## 2013-06-20 DIAGNOSIS — I959 Hypotension, unspecified: Secondary | ICD-10-CM

## 2013-06-23 ENCOUNTER — Ambulatory Visit (INDEPENDENT_AMBULATORY_CARE_PROVIDER_SITE_OTHER): Payer: Medicare Other | Admitting: Cardiovascular Disease

## 2013-06-23 ENCOUNTER — Encounter: Payer: Self-pay | Admitting: Cardiovascular Disease

## 2013-06-23 VITALS — BP 130/62 | HR 77 | Ht 72.0 in | Wt 271.2 lb

## 2013-06-23 DIAGNOSIS — I2589 Other forms of chronic ischemic heart disease: Secondary | ICD-10-CM

## 2013-06-23 DIAGNOSIS — E1122 Type 2 diabetes mellitus with diabetic chronic kidney disease: Secondary | ICD-10-CM

## 2013-06-23 DIAGNOSIS — E785 Hyperlipidemia, unspecified: Secondary | ICD-10-CM

## 2013-06-23 DIAGNOSIS — I4892 Unspecified atrial flutter: Secondary | ICD-10-CM

## 2013-06-23 DIAGNOSIS — E1129 Type 2 diabetes mellitus with other diabetic kidney complication: Secondary | ICD-10-CM

## 2013-06-23 DIAGNOSIS — N184 Chronic kidney disease, stage 4 (severe): Secondary | ICD-10-CM

## 2013-06-23 DIAGNOSIS — I255 Ischemic cardiomyopathy: Secondary | ICD-10-CM

## 2013-06-23 DIAGNOSIS — I10 Essential (primary) hypertension: Secondary | ICD-10-CM | POA: Insufficient documentation

## 2013-06-23 DIAGNOSIS — I2510 Atherosclerotic heart disease of native coronary artery without angina pectoris: Secondary | ICD-10-CM

## 2013-06-23 MED ORDER — HYDRALAZINE HCL 25 MG PO TABS: 25.0000 mg | ORAL_TABLET | Freq: Three times a day (TID) | ORAL | Status: DC

## 2013-06-23 MED ORDER — SIMVASTATIN 40 MG PO TABS: 40.0000 mg | ORAL_TABLET | Freq: Every day | ORAL | Status: DC

## 2013-06-23 NOTE — Assessment & Plan Note (Signed)
Currently with no symptoms of angina. No further workup at this time. Continue current medication regimen. 

## 2013-06-23 NOTE — Assessment & Plan Note (Signed)
Cholesterol is at goal on the current lipid regimen. No changes to the medications were made.  

## 2013-06-23 NOTE — Assessment & Plan Note (Signed)
Blood pressure is elevated over the past several weeks. We have suggested he increase hydralazine back to 25 mg 3 times a day. If blood pressure continues to run high, we would restart low-dose enalapril.

## 2013-06-23 NOTE — Progress Notes (Signed)
Patient ID: Marco [name], male    DOB: 12-Jun-1936, 77 y.o.   MRN: [id number]  HPI Comments: Marco [name] is a very pleasant 77 year old gentleman with a history of coronary artery disease, CABG, atrial fibrillation and DVT, on warfarin, diabetes, previous smoking history PVD, carotid endarterectomy, sick sinus syndrome who recently presented to the hospital at [facility] after being seen by Dr. Willeen Cass of ear nose throat with increasing shortness of breath, oxygen saturations to the mid 80s on room air. Cardiology was consulted for systolic and diastolic CHF.   He has LIMA to the LAD, vein graft to the circumflex, vein graft to the PL in June 2006 Also has mitral valve her care with #28 Edwards logic ring Pacemaker placement AAA repair May 2008  In followup today, he is doing relatively well. He does report his blood pressure is elevated. Recently seen by Dr. Hurman Horn and told to change his medications as his blood pressures running low. Coreg dose was decreased, enalapril was held and hydralazine dose was decreased.since then his blood pressures typically running 150-160 systolic.  Crestor is expensive for him, he would like to change it to simvastatin. He does wear compression hose for his chronic lower extremity edema   No chest pain or worsening shortness of breath. Does not walk or do any regular exercise.   seen by Dr. Caryn Section of [place] [facility].   Echocardiogram in the hospital 03/12/2012 showed moderate right ventricular pressures 50-60 mmHg consistent with moderate pulmonary hypertension, ejection fraction 30-35%, moderately dilated left atrium, dilated left ventricle.  Recent laboratory creatinine 3.55, BUN 59,  total cholesterol 149, LDL 93, HDL 37  EKG shows  paced rhythm with ventricular rate 77 beats per minute   Outpatient Encounter Prescriptions as of 75/27/2014  Medication Sig Dispense Refill  . amiodarone (PACERONE) 200 MG tablet Take 1 tablet (200 mg total) by mouth daily.   30 tablet  6  . apixaban (ELIQUIS) 5 MG TABS tablet Take 1 tablet (5 mg total) by mouth 2 (two) times daily.  60 tablet  11  . azelastine (ASTELIN) 137 MCG/SPRAY nasal spray Inhale 2 sprays into the lungs 3 times/day as needed-between meals & bedtime.       . calcitRIOL (ROCALTROL) 0.5 MCG capsule Take 0.5 mcg by mouth daily.      . carvedilol (COREG) 3.125 MG tablet Take one tablet by mouth in the morning and two tablets by mouth in the evening  90 tablet  6  . docusate sodium (COLACE) 100 MG capsule Take 100 mg by mouth 2 (two) times daily.        . furosemide (LASIX) 40 MG tablet Take 1 tablet (40 mg total) by mouth daily.  30 tablet    . hydrALAZINE (APRESOLINE) 25 MG tablet Take 1/2 tablet by mouth twice daily      . isosorbide dinitrate (ISORDIL) 30 MG tablet Take 1/2 tablet in the morning and 1 tablet at bedtime.  45 tablet  1  . NON FORMULARY 1.5 liters      . NOVOLOG MIX 70/30 (70-30) 100 UNIT/ML injection Inject 15 Units into the skin 2 (two) times daily with a meal. As directed.      [name] omeprazole (PRILOSEC) 20 MG capsule Take 20 mg by mouth every Monday, Wednesday, and Friday. Taking 3 times a week.      . rosuvastatin (CRESTOR) 10 MG tablet Take 10 mg by mouth daily.        . Tamsulosin HCl (FLOMAX)  0.4 MG CAPS Take 0.4 mg by mouth at bedtime. 1 po daily       No facility-administered encounter medications on file as of 75/27/2014.      Review of Systems  Constitutional: Negative.   HENT: Negative.   Eyes: Negative.   Respiratory: Positive for shortness of breath.   Cardiovascular: Negative.   Gastrointestinal: Negative.   Endocrine: Negative.   Musculoskeletal: Negative.   Skin: Negative.   Allergic/Immunologic: Negative.   Neurological: Negative.   Hematological: Negative.   Psychiatric/Behavioral: Negative.   All other systems reviewed and are negative.    BP 130/62  Pulse 77  Ht 6' (1.829 m)  Wt 271 lb 4 oz (123.038 kg)  BMI 36.78 kg/m2  Physical Exam   Nursing note and vitals reviewed. Constitutional: He is oriented to person, place, and time. He appears well-developed and well-nourished.  Obese  HENT:  Head: Normocephalic.  Nose: Nose normal.  Mouth/Throat: Oropharynx is clear and moist.  Eyes: Conjunctivae are normal. Pupils are equal, round, and reactive to light.  Neck: Normal range of motion. Neck supple. No JVD present.  Cardiovascular: Normal rate, regular rhythm, S1 normal, S2 normal, normal heart sounds and intact distal pulses.  Exam reveals no gallop and no friction rub.   No murmur heard. Pulmonary/Chest: Effort normal and breath sounds normal. No respiratory distress. He has no wheezes. He has no rales. He exhibits no tenderness.  Abdominal: Soft. Bowel sounds are normal. He exhibits no distension. There is no tenderness.  Musculoskeletal: Normal range of motion. He exhibits no edema and no tenderness.  Lymphadenopathy:    He has no cervical adenopathy.  Neurological: He is alert and oriented to person, place, and time. Coordination normal.  Skin: Skin is warm and dry. No rash noted. No erythema.  Psychiatric: He has a normal mood and affect. His behavior is normal. Judgment and thought content normal.      Assessment and Plan

## 2013-06-23 NOTE — Patient Instructions (Addendum)
You are doing well. Please increase the hydralazine back to a full pill three times a day Monitor your blood pressure Call the office if it runs high  [place] up the rest of your crestor Start simvastatin 1/2 pill daily for a few weeks, then increase up to a full pill Lab check in 3 months  Please call us if you have new issues that need to be addressed before your next appt.  Your physician wants you to follow-up in: 6 months.  You will receive a reminder letter in the mail two months in advance. If you don't receive a letter, please call our office to schedule the follow-up appointment.

## 2013-06-23 NOTE — Assessment & Plan Note (Signed)
We have encouraged continued exercise, careful diet management in an effort to lose weight. 

## 2013-06-23 NOTE — Assessment & Plan Note (Signed)
Followed by Dr. Cherylann Ratel. Recent climb in his creatinine up to 3.3

## 2013-06-30 ENCOUNTER — Other Ambulatory Visit: Payer: Self-pay | Admitting: Cardiovascular Disease

## 2013-07-01 ENCOUNTER — Other Ambulatory Visit: Payer: Self-pay | Admitting: *Deleted

## 2013-07-01 MED ORDER — AMIODARONE HCL 200 MG PO TABS: 200.0000 mg | ORAL_TABLET | Freq: Every day | ORAL | Status: DC

## 2013-07-01 NOTE — Telephone Encounter (Signed)
Requested Prescriptions   Signed Prescriptions Disp Refills  . amiodarone (PACERONE) 200 MG tablet 30 tablet 6    Sig: Take 1 tablet (200 mg total) by mouth daily.    Authorizing Provider: GOLLAN, TIMOTHY J    Ordering User: Grae Leathers C    

## 2013-07-02 ENCOUNTER — Encounter: Payer: Self-pay | Admitting: Cardiology

## 2013-07-02 LAB — BASIC METABOLIC PANEL
BUN: 38 mg/dL — ABNORMAL HIGH (ref 8–27)
CO2: 28 mmol/L (ref 18–29)
Calcium: 9.7 mg/dL (ref 8.6–10.2)
Chloride: 99 mmol/L (ref 97–108)
Creatinine, Ser: 2.72 mg/dL — ABNORMAL HIGH (ref 0.76–1.27)
GFR calc non Af Amer: 22 mL/min/{1.73_m2} — ABNORMAL LOW (ref >59)
Glucose: 76 mg/dL (ref 65–99)
Sodium: 141 mmol/L (ref 134–144)

## 2013-07-03 ENCOUNTER — Other Ambulatory Visit: Payer: Self-pay

## 2013-07-07 ENCOUNTER — Telehealth: Payer: Self-pay

## 2013-07-07 NOTE — Telephone Encounter (Signed)
Spoke w/ pt.  He is aware of results.  Reports that BP has been "absolutely great", 120/68. Reports 145/80 when he wakes up , but goes back to normal.

## 2013-07-07 NOTE — Telephone Encounter (Signed)
Message copied by Marilynne Halsted on Mon Jul 07, 2013  5:00 PM ------      Message from: Antonieta Iba      Created: Sat Jul 05, 2013 10:00 PM       Labs are stable      Renal function elevated but at his baseline ------

## 2013-08-10 ENCOUNTER — Other Ambulatory Visit: Payer: Self-pay | Admitting: Cardiology

## 2013-08-12 ENCOUNTER — Other Ambulatory Visit: Payer: Self-pay | Admitting: *Deleted

## 2013-08-12 MED ORDER — ISOSORBIDE DINITRATE 30 MG PO TABS: ORAL_TABLET | ORAL | Status: DC

## 2013-08-12 NOTE — Telephone Encounter (Signed)
Requested Prescriptions   Signed Prescriptions Disp Refills  . isosorbide dinitrate (ISORDIL) 30 MG tablet 45 tablet 1    Sig: take 1/2 tablet by mouth every morning and 1 tablet at bedtime    Authorizing Provider: Antonieta Iba    Ordering User: Kendrick Fries

## 2013-09-01 ENCOUNTER — Other Ambulatory Visit: Payer: Self-pay

## 2013-09-01 DIAGNOSIS — I2510 Atherosclerotic heart disease of native coronary artery without angina pectoris: Secondary | ICD-10-CM

## 2013-09-01 DIAGNOSIS — I4892 Unspecified atrial flutter: Secondary | ICD-10-CM

## 2013-09-01 MED ORDER — CARVEDILOL 6.25 MG PO TABS: 6.2500 mg | ORAL_TABLET | Freq: Every day | ORAL | Status: DC

## 2013-09-01 NOTE — Telephone Encounter (Signed)
Requested Prescriptions   Signed Prescriptions Disp Refills  . carvedilol (COREG) 6.25 MG tablet 30 tablet 3    Sig: Take 1 tablet (6.25 mg total) by mouth daily.    Authorizing Provider: KLEIN, STEVEN C    Ordering User: LOPEZ, MARINA C    

## 2013-09-01 NOTE — Telephone Encounter (Signed)
Pt wife called  States pt is taking carvedilol, has been breaking his current pill in half, would like a new rx. Please call.

## 2013-09-10 ENCOUNTER — Encounter: Payer: Medicare Other | Admitting: *Deleted

## 2013-09-15 ENCOUNTER — Encounter: Payer: Self-pay | Admitting: *Deleted

## 2013-09-23 ENCOUNTER — Ambulatory Visit (INDEPENDENT_AMBULATORY_CARE_PROVIDER_SITE_OTHER): Payer: Medicare Other

## 2013-09-23 ENCOUNTER — Ambulatory Visit (INDEPENDENT_AMBULATORY_CARE_PROVIDER_SITE_OTHER): Payer: Medicare Other | Admitting: Internal Medicine

## 2013-09-23 ENCOUNTER — Encounter: Payer: Self-pay | Admitting: Internal Medicine

## 2013-09-23 ENCOUNTER — Telehealth: Payer: Self-pay | Admitting: *Deleted

## 2013-09-23 ENCOUNTER — Telehealth: Payer: Self-pay

## 2013-09-23 ENCOUNTER — Ambulatory Visit: Payer: Self-pay | Admitting: Internal Medicine

## 2013-09-23 ENCOUNTER — Other Ambulatory Visit: Payer: Self-pay

## 2013-09-23 VITALS — BP 150/80 | HR 62 | Ht 72.0 in | Wt 268.0 lb

## 2013-09-23 DIAGNOSIS — M6281 Muscle weakness (generalized): Secondary | ICD-10-CM

## 2013-09-23 DIAGNOSIS — E785 Hyperlipidemia, unspecified: Secondary | ICD-10-CM

## 2013-09-23 DIAGNOSIS — I4892 Unspecified atrial flutter: Secondary | ICD-10-CM

## 2013-09-23 DIAGNOSIS — R29898 Other symptoms and signs involving the musculoskeletal system: Secondary | ICD-10-CM

## 2013-09-23 DIAGNOSIS — I4891 Unspecified atrial fibrillation: Secondary | ICD-10-CM

## 2013-09-23 DIAGNOSIS — I2510 Atherosclerotic heart disease of native coronary artery without angina pectoris: Secondary | ICD-10-CM

## 2013-09-23 LAB — MDC_IDC_ENUM_SESS_TYPE_INCLINIC
Battery Remaining Longevity: 140 mo
Brady Statistic AP VP Percent: 0 %
Brady Statistic AS VP Percent: 0 %
Brady Statistic AS VS Percent: 0 %
Lead Channel Impedance Value: 486 Ohm
Lead Channel Pacing Threshold Amplitude: 0.5 V
Lead Channel Pacing Threshold Amplitude: 1 V
Lead Channel Pacing Threshold Pulse Width: 0.4 ms
Lead Channel Sensing Intrinsic Amplitude: 15.67 mV
Lead Channel Setting Pacing Amplitude: 2 V
Lead Channel Setting Pacing Amplitude: 2.5 V
Lead Channel Setting Pacing Pulse Width: 0.4 ms
Lead Channel Setting Sensing Sensitivity: 5.6 mV
MDC IDC MSMT BATTERY IMPEDANCE: 109 Ohm
MDC IDC MSMT BATTERY VOLTAGE: 2.8 V
MDC IDC MSMT LEADCHNL RA PACING THRESHOLD PULSEWIDTH: 0.4 ms
MDC IDC MSMT LEADCHNL RV IMPEDANCE VALUE: 446 Ohm
MDC IDC SESS DTM: 20150127105412
MDC IDC STAT BRADY AP VS PERCENT: 99 %

## 2013-09-23 NOTE — Telephone Encounter (Signed)
LVM to let patient know that per Dr. [name] do not change any medications. Instructed patient to call with any questions.

## 2013-09-23 NOTE — Telephone Encounter (Signed)
Patient is going to see Dr. [name] in office today to discuss symptoms.

## 2013-09-23 NOTE — Telephone Encounter (Signed)
Patient came in for lab work today. Patient c/o not having much feeling in left arm and hand; patient states thinks had a stroke and can't use hand to open up a bottle or anything for that matter like he once was. Please advise what patient should do.

## 2013-09-23 NOTE — Progress Notes (Signed)
Patient Care Team: Miguel Aschoff, MD as PCP - General (Unknown Physician Specialty) Minus Breeding, MD (Cardiology)   HPI  Marco [name] is a 78 y.o. male Seen as an add-on today because of complaints of in his left and of weakness and numbness in the lateral/ulnar 3 fingers. This has been present for about a month and has not changed appreciably. He has not had any headaches.  He has a history of atrial fibrillation implantatedwith a Medtronic dual-chamber device for sick sinus syndrome.[name] He was seen in February urgently because of symptomatic tachypalpitations. He underwent cardioversion with TE . Most recent ejection fraction at the time of TEE 2/14--25-30% with evidence of pulmonary hypertension and biatrial enlargement    He recently had enalapril initiated K 9/14 >>5.7 and Cr 3.14,    Enalapril was discontinued. Last creatinine from 10/14 was 3.55  He remains on apixaban 5 mg twice daily; his age of 20   Past Medical History  Diagnosis Date  . Cardiomyopathy -mixed     EF 30-35% -- 7/13; Pulm HTN and modest RV dysfunction  . Coronary artery disease      The patient had stent to the right coronary artery in 1999.  The patient had unstable angina.  Non-Q-wave myocardial infarction in June 2006.  Coronary artery bypass graft surgery on February 01, 2005, with  mitral valve annuloplasty with   . Sick sinus syndrome   . Mitral regurgitation--s/p repair     placement of a 28-mm Edwards   ETlogix ring.     [name]      Generator replacement 10/13  . Diabetes mellitus   . Hypertension   . Hyperlipidemia   . BPH (benign prostatic hypertrophy)   . Renal insufficiency Gd 4   . GERD (gastroesophageal reflux disease)   . Chronic systolic heart failure     New York Heart Association class II-III   . Obesity   . DVT (deep venous thrombosis)   . Anemia   . Sleep apnea     Not tolerant of CPAP  . Skin cancer 2013    [name] x 2 back and left neck  . AAA  (abdominal aortic aneurysm)repaired 2008    repair  . Atrial fibrillation/Flutter     newly identified 2014  . PVC (premature ventricular contraction)     s/p RFCA JAllred 2011    Past Surgical History  Procedure Laterality Date  . Coronary artery bypass graft      CABG with a  LIMA to the LAD, SVG to circumflex, SVG to posterolateral June2006.  . Mitral valve repair  2006     mitral valve repair with a #28 Edwards logic ring  . Pacemaker placement    . Abdominal aortic aneurysm repair       May 2008,   . Cardiac catheterization  2006    @ [facility]  . Insert / replace / remove pacemaker  2006    Dr. [name]  . Skin cancer excision       x 6  . Cataract extraction      right  . Skin biopsy      left wrist; right elbow  . Transesophageal echocardiogram    . Skin cancer excision      Current Outpatient Prescriptions  Medication Sig Dispense Refill  . amiodarone (PACERONE) 200 MG tablet Take 1 tablet (200 mg total) by mouth daily.  30 tablet  6  . apixaban (ELIQUIS) 5 MG  TABS tablet Take 1 tablet (5 mg total) by mouth 2 (two) times daily.  60 tablet  11  . calcitRIOL (ROCALTROL) 0.5 MCG capsule Take 0.5 mcg by mouth daily.      . carvedilol (COREG) 3.125 MG tablet Take one tablet by mouth in the morning and two tablets by mouth in the evening  90 tablet  6  . carvedilol (COREG) 6.25 MG tablet Take 1 tablet (6.25 mg total) by mouth daily.  30 tablet  3  . docusate sodium (COLACE) 100 MG capsule Take 100 mg by mouth 2 (two) times daily.        . furosemide (LASIX) 40 MG tablet Take 1 tablet (40 mg total) by mouth daily.  30 tablet    . hydrALAZINE (APRESOLINE) 25 MG tablet Take 12.5 mg by mouth 2 (two) times daily.      . isosorbide dinitrate (ISORDIL) 30 MG tablet take 1/2 tablet by mouth every morning and 1 tablet at bedtime  45 tablet  1  . NON FORMULARY 1.5 liters      . NOVOLOG MIX 70/30 (70-30) 100 UNIT/ML injection Inject 15 Units into the skin 2 (two) times daily with a meal.  As directed.      [name] omeprazole (PRILOSEC) 20 MG capsule Take 20 mg by mouth every Monday, Wednesday, and Friday. Taking 3 times a week.      . simvastatin (ZOCOR) 40 MG tablet Take 1 tablet (40 mg total) by mouth at bedtime.  30 tablet  11  . Tamsulosin HCl (FLOMAX) 0.4 MG CAPS Take 0.4 mg by mouth at bedtime. 1 po daily       No current facility-administered medications for this visit.    Allergies  Allergen Reactions  . Shellfish Allergy Hives    Review of Systems negative except from HPI and PMH  Physical Exam BP 150/80  Pulse 62  Ht 6' (1.829 m)  Wt 268 lb (121.564 kg)  BMI 36.34 kg/m2 Well developed and morbidly obese Caucasian male in no acute distress HENT normal E scleral and icterus clear Neck Supplechief Clear to ausculation regular rate and rhythm,   Soft with active bowel sounds No clubbing cyanosis 2+ Edema Alert and oriented, there is weakness with finger extension and hypoventilating Skin Warm and Dry    Assessment and  Plan

## 2013-09-23 NOTE — Assessment & Plan Note (Signed)
For now continue anticoagulation. I am looking at the dosing information for apixaban and do not see exclusion for his creatinine.

## 2013-09-23 NOTE — Telephone Encounter (Signed)
Patient seen by [facility]

## 2013-09-23 NOTE — Assessment & Plan Note (Signed)
He has clear demonstrable weakness and wasting on his left hand. This could even be peripheral or central. Within been on anticoagulation we'll undertake a CT scan to exclude bleeding. If this is negative, he needs referral to neurology for distinction between central and peripheral causes.

## 2013-09-23 NOTE — Patient Instructions (Addendum)
Non-Cardiac CT scanning, (CAT scanning), is a noninvasive, special x-ray that produces cross-sectional images of the body using x-rays and a computer. CT scans help physicians diagnose and treat medical conditions. For some CT exams, a contrast material is used to enhance visibility in the area of the body being studied. CT scans provide greater clarity and reveal more details than regular x-ray exams.  CT Head without contrast at [facility]  Today at 1:30 pm

## 2013-09-24 LAB — LIPID PANEL
CHOL/HDL RATIO: 3.5 ratio (ref 0.0–5.0)
Cholesterol, Total: 149 mg/dL (ref 100–199)
HDL: 42 mg/dL (ref >39)
LDL CALC: 92 mg/dL (ref 0–99)
Triglycerides: 76 mg/dL (ref 0–149)
VLDL Cholesterol Cal: 15 mg/dL (ref 5–40)

## 2013-09-29 ENCOUNTER — Telehealth: Payer: Self-pay

## 2013-09-29 ENCOUNTER — Other Ambulatory Visit: Payer: Self-pay

## 2013-09-29 MED ORDER — ROSUVASTATIN CALCIUM 20 MG PO TABS: 20.0000 mg | ORAL_TABLET | Freq: Every day | ORAL | Status: DC

## 2013-09-29 NOTE — Telephone Encounter (Signed)
Pt called back regarding instructions given for labs. Pt was instructed to increase Crestor, but reports that this was changed to simvastatin on his visit 06/23/13. Pt is currently taking simvastatin 40mg  a whole pill daily and would like to know if this dose needs to be increased. Please advise.  Thank you!

## 2013-09-30 NOTE — Telephone Encounter (Signed)
Unable to advance simvastatin to 80 mg as he is on amiodarone Options include weight loss to get LDL less than 70 Or change to higher dose Lipitor 80 mg daily Or Crestor 40 mg daily

## 2013-10-01 MED ORDER — ROSUVASTATIN CALCIUM 20 MG PO TABS: 20.0000 mg | ORAL_TABLET | Freq: Every day | ORAL | Status: DC

## 2013-10-01 NOTE — Telephone Encounter (Signed)
Spoke w/ pt.  He reports that his crestor was recently increased to 20mg  daily.  He states that he has recently started an exercise regimen and is determined to lose wt. Therefore, he would like to continue on his current dose of crestor and see if his wt and cholesterol comes down. States that if Dr. Rockey Situ would like to see him sooner than 6 mos, he would be happy to come in or have repeat labs drawn.

## 2013-10-13 ENCOUNTER — Other Ambulatory Visit: Payer: Self-pay | Admitting: Vascular Surgery

## 2013-10-13 DIAGNOSIS — Z48812 Encounter for surgical aftercare following surgery on the circulatory system: Secondary | ICD-10-CM

## 2013-10-13 DIAGNOSIS — I6529 Occlusion and stenosis of unspecified carotid artery: Secondary | ICD-10-CM

## 2013-11-18 ENCOUNTER — Ambulatory Visit: Payer: Self-pay | Admitting: Vascular Surgery

## 2013-11-18 LAB — BASIC METABOLIC PANEL
ANION GAP: 6 — AB (ref 7–16)
BUN: 45 mg/dL — ABNORMAL HIGH (ref 7–18)
CALCIUM: 9.4 mg/dL (ref 8.5–10.1)
CHLORIDE: 100 mmol/L (ref 98–107)
CREATININE: 2.6 mg/dL — AB (ref 0.60–1.30)
Co2: 32 mmol/L (ref 21–32)
GFR CALC AF AMER: 27 — AB
GFR CALC NON AF AMER: 23 — AB
Glucose: 192 mg/dL — ABNORMAL HIGH (ref 65–99)
Osmolality: 292 (ref 275–301)
POTASSIUM: 4.4 mmol/L (ref 3.5–5.1)
SODIUM: 138 mmol/L (ref 136–145)

## 2013-12-14 ENCOUNTER — Other Ambulatory Visit: Payer: Self-pay | Admitting: Cardiovascular Disease

## 2013-12-22 ENCOUNTER — Encounter: Payer: Self-pay | Admitting: Cardiovascular Disease

## 2013-12-22 ENCOUNTER — Ambulatory Visit (INDEPENDENT_AMBULATORY_CARE_PROVIDER_SITE_OTHER): Payer: Medicare Other | Admitting: Cardiovascular Disease

## 2013-12-22 VITALS — BP 118/62 | HR 72 | Ht 72.0 in | Wt 267.5 lb

## 2013-12-22 DIAGNOSIS — E785 Hyperlipidemia, unspecified: Secondary | ICD-10-CM

## 2013-12-22 DIAGNOSIS — I4891 Unspecified atrial fibrillation: Secondary | ICD-10-CM

## 2013-12-22 DIAGNOSIS — I2510 Atherosclerotic heart disease of native coronary artery without angina pectoris: Secondary | ICD-10-CM

## 2013-12-22 DIAGNOSIS — R29898 Other symptoms and signs involving the musculoskeletal system: Secondary | ICD-10-CM

## 2013-12-22 DIAGNOSIS — I255 Ischemic cardiomyopathy: Secondary | ICD-10-CM

## 2013-12-22 DIAGNOSIS — I4892 Unspecified atrial flutter: Secondary | ICD-10-CM

## 2013-12-22 DIAGNOSIS — I10 Essential (primary) hypertension: Secondary | ICD-10-CM

## 2013-12-22 DIAGNOSIS — Z950 Presence of cardiac pacemaker: Secondary | ICD-10-CM

## 2013-12-22 DIAGNOSIS — I2589 Other forms of chronic ischemic heart disease: Secondary | ICD-10-CM

## 2013-12-22 DIAGNOSIS — M6281 Muscle weakness (generalized): Secondary | ICD-10-CM

## 2013-12-22 NOTE — Assessment & Plan Note (Signed)
Appears to be maintaining normal sinus rhythm on today's visit. We'll continue on anticoagulation

## 2013-12-22 NOTE — Assessment & Plan Note (Signed)
We'll continue aggressive cholesterol management. Appears to relatively euvolemic on today's visit   

## 2013-12-22 NOTE — Assessment & Plan Note (Signed)
Followed by Dr. Klein 

## 2013-12-22 NOTE — Assessment & Plan Note (Signed)
Cholesterol is at goal on the current lipid regimen. No changes to the medications were made.  

## 2013-12-22 NOTE — Assessment & Plan Note (Signed)
Currently with no symptoms of angina. No further workup at this time. Continue current medication regimen. 

## 2013-12-22 NOTE — Patient Instructions (Addendum)
You are doing well. No medication changes were made.  Keep a watch on your blood pressure If you run low, Hold the hydralazine  Cholesterol check at the end of the summer  Please call us if you have new issues that need to be addressed before your next appt.  Your physician wants you to follow-up in: 6 months.  You will receive a reminder letter in the mail two months in advance. If you don't receive a letter, please call our office to schedule the follow-up appointment.

## 2013-12-22 NOTE — Assessment & Plan Note (Signed)
Blood pressure is well controlled on today's visit. No changes made to the medications. 

## 2013-12-22 NOTE — Assessment & Plan Note (Signed)
Prior workup unremarkable. Unable to exclude small TIA. Possibly noncardiac, more neuropathic

## 2013-12-22 NOTE — Progress Notes (Signed)
Patient ID: Marco [name], male    DOB: Mar 15, 1936, 78 y.o.   MRN: [id number]  HPI Comments: [name] is a very pleasant 78 year old gentleman with a history of coronary artery disease, CABG, atrial fibrillation and DVT, on warfarin, diabetes, previous smoking history PVD, carotid endarterectomy, sick sinus syndrome who recently presented to the hospital at [facility] after being seen by Dr. Richardson Landry of ear nose throat with increasing shortness of breath, oxygen saturations to the mid 80s on room air. Cardiology was consulted for systolic and diastolic CHF.  Pacemaker is followed by Dr. [name]  He has LIMA to the LAD, vein graft to the circumflex, vein graft to the PL in June 2006 Also has mitral valve her care with #28 Edwards logic ring S/p Pacemaker placement AAA repair May 2008  In followup today, he is doing relatively well. He reports having left hand weakness that started in December 2014. He recently presented to the hospital for further evaluation and secondary to renal dysfunction, had a carotid angiogram. This showed patent right carotid endarterectomy site, moderate disease on the left etiology of his symptoms was unclear.  on previous office visits with Dr. [name], blood pressures running low. Coreg dosing was decreased, hydralazine was decreased   he has lost some weight. He has has been tracking his blood pressure at home with systolic pressure typically in the 110-130 range, heart rate in the 60-70 range, blood sugars in the 130 rangeleft hand with improvement of his symptoms, close to baseline  He does wear compression hose for his chronic lower extremity edema  He is tolerating his anticoagulation and Crestor  No chest pain or worsening shortness of breath. Does not walk or do any regular exercise.   seen by Dr. Hassell Done of [place] [facility].   Echocardiogram in the hospital 03/12/2012 showed moderate right ventricular pressures 50-60 mmHg consistent with moderate pulmonary hypertension,  ejection fraction 30-35%, moderately dilated left atrium, dilated left ventricle.   laboratory creatinine 3.55, BUN 59,  total cholesterol 149, LDL 93, HDL 37  EKG shows  normal sinus rhythm with rate 76 beats per minute, intraventricular conduction delay noted   Outpatient Encounter Prescriptions as of 12/22/2013  Medication Sig  . amiodarone (PACERONE) 200 MG tablet Take 1 tablet (200 mg total) by mouth daily.  [name] apixaban (ELIQUIS) 5 MG TABS tablet Take 1 tablet (5 mg total) by mouth 2 (two) times daily.  . calcitRIOL (ROCALTROL) 0.5 MCG capsule Take 0.5 mcg by mouth daily.  . carvedilol (COREG) 3.125 MG tablet Take 3.125 mg by mouth every morning.  . carvedilol (COREG) 6.25 MG tablet Take 1 tablet (6.25 mg total) by mouth daily.  . cholecalciferol (VITAMIN D) 1000 UNITS tablet Take 1,000 Units by mouth daily.  [name] docusate sodium (COLACE) 100 MG capsule Take 100 mg by mouth 2 (two) times daily.    . furosemide (LASIX) 40 MG tablet Take 1 tablet (40 mg total) by mouth daily.  . hydrALAZINE (APRESOLINE) 25 MG tablet Take 12.5 mg by mouth 2 (two) times daily.  . isosorbide dinitrate (ISORDIL) 30 MG tablet take 1/2 tablet by mouth every morning and 1 tablet at bedtime  . Multiple Vitamins-Minerals (MULTI-VITAMIN GUMMIES) CHEW Chew by mouth daily.  . NON FORMULARY 1.5 liters  . NOVOLOG MIX 70/30 (70-30) 100 UNIT/ML injection Inject 15 Units into the skin 2 (two) times daily with a meal. As directed.  [name] omeprazole (PRILOSEC) 20 MG capsule Take 20 mg by mouth every Monday, Wednesday, and Friday.  Taking 3 times a week.  . rosuvastatin (CRESTOR) 20 MG tablet Take 1 tablet (20 mg total) by mouth daily.  . Tamsulosin HCl (FLOMAX) 0.4 MG CAPS Take 0.4 mg by mouth at bedtime. 1 po daily   Review of Systems  Constitutional: Negative.   HENT: Negative.   Eyes: Negative.   Respiratory: Positive for shortness of breath.   Cardiovascular: Negative.   Gastrointestinal: Negative.   Endocrine: Negative.    Musculoskeletal: Negative.   Skin: Negative.   Allergic/Immunologic: Negative.   Neurological: Negative.   Hematological: Negative.   Psychiatric/Behavioral: Negative.   All other systems reviewed and are negative.   BP 118/62  Pulse 72  Ht 6' (1.829 m)  Wt 267 lb 8 oz (121.337 kg)  BMI 36.27 kg/m2  Physical Exam  Nursing note and vitals reviewed. Constitutional: He is oriented to person, place, and time. He appears well-developed and well-nourished.  Obese  HENT:  Head: Normocephalic.  Nose: Nose normal.  Mouth/Throat: Oropharynx is clear and moist.  Eyes: Conjunctivae are normal. Pupils are equal, round, and reactive to light.  Neck: Normal range of motion. Neck supple. No JVD present.  Cardiovascular: Normal rate, regular rhythm, S1 normal, S2 normal, normal heart sounds and intact distal pulses.  Exam reveals no gallop and no friction rub.   No murmur heard. Pulmonary/Chest: Effort normal and breath sounds normal. No respiratory distress. He has no wheezes. He has no rales. He exhibits no tenderness.  Abdominal: Soft. Bowel sounds are normal. He exhibits no distension. There is no tenderness.  Musculoskeletal: Normal range of motion. He exhibits no edema and no tenderness.  Lymphadenopathy:    He has no cervical adenopathy.  Neurological: He is alert and oriented to person, place, and time. Coordination normal.  Skin: Skin is warm and dry. No rash noted. No erythema.  Psychiatric: He has a normal mood and affect. His behavior is normal. Judgment and thought content normal.      Assessment and Plan

## 2013-12-25 ENCOUNTER — Encounter: Payer: Medicare Other | Admitting: *Deleted

## 2013-12-29 ENCOUNTER — Other Ambulatory Visit: Payer: Self-pay

## 2013-12-29 MED ORDER — CARVEDILOL 6.25 MG PO TABS: 6.2500 mg | ORAL_TABLET | Freq: Every day | ORAL | Status: DC

## 2013-12-29 NOTE — Telephone Encounter (Signed)
Refill sent for carvedilol 6.25  

## 2013-12-30 ENCOUNTER — Other Ambulatory Visit: Payer: Self-pay | Admitting: *Deleted

## 2013-12-30 MED ORDER — CARVEDILOL 6.25 MG PO TABS: 6.2500 mg | ORAL_TABLET | Freq: Every day | ORAL | Status: DC

## 2013-12-30 NOTE — Telephone Encounter (Signed)
Requested Prescriptions   Signed Prescriptions Disp Refills  . carvedilol (COREG) 6.25 MG tablet 30 tablet 3    Sig: Take 1 tablet (6.25 mg total) by mouth daily.    Authorizing Provider: Minna Merritts    Ordering User: Britt Bottom

## 2014-01-07 ENCOUNTER — Encounter: Payer: Self-pay | Admitting: *Deleted

## 2014-01-20 ENCOUNTER — Other Ambulatory Visit: Payer: Self-pay

## 2014-01-20 DIAGNOSIS — I4892 Unspecified atrial flutter: Secondary | ICD-10-CM

## 2014-01-20 DIAGNOSIS — I2510 Atherosclerotic heart disease of native coronary artery without angina pectoris: Secondary | ICD-10-CM

## 2014-01-20 MED ORDER — APIXABAN 5 MG PO TABS: 5.0000 mg | ORAL_TABLET | Freq: Two times a day (BID) | ORAL | Status: DC

## 2014-01-20 NOTE — Telephone Encounter (Signed)
Refill eliquis

## 2014-01-23 ENCOUNTER — Other Ambulatory Visit: Payer: Self-pay | Admitting: *Deleted

## 2014-01-23 MED ORDER — AMIODARONE HCL 200 MG PO TABS: 200.0000 mg | ORAL_TABLET | Freq: Every day | ORAL | Status: DC

## 2014-01-23 NOTE — Telephone Encounter (Signed)
Requested Prescriptions   Signed Prescriptions Disp Refills  . amiodarone (PACERONE) 200 MG tablet 30 tablet 6    Sig: Take 1 tablet (200 mg total) by mouth daily.    Authorizing Provider: Minna Merritts    Ordering User: Britt Bottom

## 2014-01-27 ENCOUNTER — Encounter: Payer: Self-pay | Admitting: Cardiology

## 2014-02-02 ENCOUNTER — Ambulatory Visit (INDEPENDENT_AMBULATORY_CARE_PROVIDER_SITE_OTHER): Payer: Medicare Other | Admitting: *Deleted

## 2014-02-02 DIAGNOSIS — I495 Sick sinus syndrome: Secondary | ICD-10-CM

## 2014-02-02 NOTE — Progress Notes (Signed)
Remote pacemaker transmission.   

## 2014-02-04 LAB — MDC_IDC_ENUM_SESS_TYPE_REMOTE
Battery Impedance: 133 Ohm
Battery Remaining Longevity: 132 mo
Battery Voltage: 2.8 V
Brady Statistic AP VS Percent: 99 %
Brady Statistic AS VP Percent: 1 %
Lead Channel Pacing Threshold Amplitude: 0.625 V
Lead Channel Pacing Threshold Pulse Width: 0.4 ms
Lead Channel Pacing Threshold Pulse Width: 0.4 ms
Lead Channel Sensing Intrinsic Amplitude: 11.2 mV
Lead Channel Setting Pacing Amplitude: 2 V
Lead Channel Setting Pacing Amplitude: 2.5 V
Lead Channel Setting Pacing Pulse Width: 0.4 ms
MDC IDC MSMT LEADCHNL RA IMPEDANCE VALUE: 454 Ohm
MDC IDC MSMT LEADCHNL RV IMPEDANCE VALUE: 408 Ohm
MDC IDC MSMT LEADCHNL RV PACING THRESHOLD AMPLITUDE: 1 V
MDC IDC SESS DTM: 20150608171526
MDC IDC SET LEADCHNL RV SENSING SENSITIVITY: 5.6 mV
MDC IDC STAT BRADY AP VP PERCENT: 0 %
MDC IDC STAT BRADY AS VS PERCENT: 0 %

## 2014-02-08 ENCOUNTER — Other Ambulatory Visit: Payer: Self-pay | Admitting: Cardiovascular Disease

## 2014-02-25 ENCOUNTER — Encounter: Payer: Self-pay | Admitting: Cardiology

## 2014-03-05 ENCOUNTER — Ambulatory Visit: Payer: Medicare Other | Admitting: Family

## 2014-03-05 ENCOUNTER — Other Ambulatory Visit (HOSPITAL_COMMUNITY): Payer: Medicare Other

## 2014-03-12 ENCOUNTER — Ambulatory Visit: Payer: Medicare Other | Admitting: Family

## 2014-03-12 ENCOUNTER — Emergency Department: Payer: Self-pay | Admitting: Emergency Medicine

## 2014-03-12 ENCOUNTER — Other Ambulatory Visit (HOSPITAL_COMMUNITY): Payer: Medicare Other

## 2014-03-12 LAB — COMPREHENSIVE METABOLIC PANEL
ALT: 22 U/L (ref 12–78)
ANION GAP: 4 — AB (ref 7–16)
Albumin: 3.1 g/dL — ABNORMAL LOW (ref 3.4–5.0)
Alkaline Phosphatase: 52 U/L
BILIRUBIN TOTAL: 0.4 mg/dL (ref 0.2–1.0)
BUN: 38 mg/dL — ABNORMAL HIGH (ref 7–18)
CALCIUM: 9.4 mg/dL (ref 8.5–10.1)
Chloride: 99 mmol/L (ref 98–107)
Co2: 34 mmol/L — ABNORMAL HIGH (ref 21–32)
Creatinine: 2.46 mg/dL — ABNORMAL HIGH (ref 0.60–1.30)
EGFR (African American): 29 — ABNORMAL LOW
GFR CALC NON AF AMER: 25 — AB
Glucose: 129 mg/dL — ABNORMAL HIGH (ref 65–99)
OSMOLALITY: 285 (ref 275–301)
POTASSIUM: 4.4 mmol/L (ref 3.5–5.1)
SGOT(AST): 20 U/L (ref 15–37)
Sodium: 137 mmol/L (ref 136–145)
Total Protein: 7.9 g/dL (ref 6.4–8.2)

## 2014-03-12 LAB — CBC WITH DIFFERENTIAL/PLATELET
BASOS PCT: 0.9 %
Basophil #: 0.1 10*3/uL (ref 0.0–0.1)
EOS ABS: 0.2 10*3/uL (ref 0.0–0.7)
Eosinophil %: 2 %
HCT: 42.7 % (ref 40.0–52.0)
HGB: 13.6 g/dL (ref 13.0–18.0)
Lymphocyte #: 0.8 10*3/uL — ABNORMAL LOW (ref 1.0–3.6)
Lymphocyte %: 10.2 %
MCH: 28.9 pg (ref 26.0–34.0)
MCHC: 31.8 g/dL — ABNORMAL LOW (ref 32.0–36.0)
MCV: 91 fL (ref 80–100)
MONOS PCT: 7.9 %
Monocyte #: 0.6 x10 3/mm (ref 0.2–1.0)
NEUTROS ABS: 6.3 10*3/uL (ref 1.4–6.5)
Neutrophil %: 79 %
Platelet: 198 10*3/uL (ref 150–440)
RBC: 4.69 10*6/uL (ref 4.40–5.90)
RDW: 15.4 % — ABNORMAL HIGH (ref 11.5–14.5)
WBC: 8 10*3/uL (ref 3.8–10.6)

## 2014-03-12 LAB — URINALYSIS, COMPLETE
Bacteria: NONE SEEN
Bilirubin,UR: NEGATIVE
Glucose,UR: 50 mg/dL (ref 0–75)
KETONE: NEGATIVE
Leukocyte Esterase: NEGATIVE
Nitrite: NEGATIVE
Ph: 7 (ref 4.5–8.0)
Protein: 30
RBC,UR: 8 /HPF (ref 0–5)
SPECIFIC GRAVITY: 1.013 (ref 1.003–1.030)
WBC UR: 1 /HPF (ref 0–5)

## 2014-03-12 LAB — TROPONIN I: Troponin-I: <0.02 ng/mL

## 2014-03-12 LAB — LIPASE, BLOOD: Lipase: 62 U/L — ABNORMAL LOW (ref 73–393)

## 2014-03-13 ENCOUNTER — Inpatient Hospital Stay: Payer: Self-pay | Admitting: Internal Medicine

## 2014-03-13 LAB — URINALYSIS, COMPLETE
Bacteria: NONE SEEN
Bilirubin,UR: NEGATIVE
KETONE: NEGATIVE
LEUKOCYTE ESTERASE: NEGATIVE
Nitrite: NEGATIVE
Ph: 5 (ref 4.5–8.0)
Specific Gravity: 1.014 (ref 1.003–1.030)

## 2014-03-13 LAB — CBC
HCT: 42.9 % (ref 40.0–52.0)
HGB: 13.7 g/dL (ref 13.0–18.0)
MCH: 29.2 pg (ref 26.0–34.0)
MCHC: 32 g/dL (ref 32.0–36.0)
MCV: 91 fL (ref 80–100)
Platelet: 195 10*3/uL (ref 150–440)
RBC: 4.7 10*6/uL (ref 4.40–5.90)
RDW: 15.6 % — ABNORMAL HIGH (ref 11.5–14.5)
WBC: 9.7 10*3/uL (ref 3.8–10.6)

## 2014-03-13 LAB — COMPREHENSIVE METABOLIC PANEL
ALT: 19 U/L (ref 12–78)
Albumin: 3 g/dL — ABNORMAL LOW (ref 3.4–5.0)
Alkaline Phosphatase: 50 U/L
Anion Gap: 5 — ABNORMAL LOW (ref 7–16)
BUN: 41 mg/dL — AB (ref 7–18)
Bilirubin,Total: 0.5 mg/dL (ref 0.2–1.0)
CHLORIDE: 97 mmol/L — AB (ref 98–107)
CREATININE: 2.63 mg/dL — AB (ref 0.60–1.30)
Calcium, Total: 9.9 mg/dL (ref 8.5–10.1)
Co2: 33 mmol/L — ABNORMAL HIGH (ref 21–32)
GFR CALC AF AMER: 26 — AB
GFR CALC NON AF AMER: 23 — AB
Glucose: 140 mg/dL — ABNORMAL HIGH (ref 65–99)
Osmolality: 283 (ref 275–301)
POTASSIUM: 4.3 mmol/L (ref 3.5–5.1)
SGOT(AST): 20 U/L (ref 15–37)
Sodium: 135 mmol/L — ABNORMAL LOW (ref 136–145)
Total Protein: 7.7 g/dL (ref 6.4–8.2)

## 2014-03-13 LAB — LIPASE, BLOOD: Lipase: 50 U/L — ABNORMAL LOW (ref 73–393)

## 2014-03-13 LAB — TROPONIN I: Troponin-I: <0.02 ng/mL

## 2014-03-14 LAB — MAGNESIUM: Magnesium: 2.2 mg/dL

## 2014-03-14 LAB — BASIC METABOLIC PANEL
Anion Gap: 4 — ABNORMAL LOW (ref 7–16)
BUN: 42 mg/dL — ABNORMAL HIGH (ref 7–18)
CALCIUM: 8.8 mg/dL (ref 8.5–10.1)
CREATININE: 2.54 mg/dL — AB (ref 0.60–1.30)
Chloride: 102 mmol/L (ref 98–107)
Co2: 34 mmol/L — ABNORMAL HIGH (ref 21–32)
EGFR (African American): 28 — ABNORMAL LOW
EGFR (Non-African Amer.): 24 — ABNORMAL LOW
Glucose: 105 mg/dL — ABNORMAL HIGH (ref 65–99)
Osmolality: 290 (ref 275–301)
Potassium: 4.1 mmol/L (ref 3.5–5.1)
SODIUM: 140 mmol/L (ref 136–145)

## 2014-03-14 LAB — CBC WITH DIFFERENTIAL/PLATELET
BASOS ABS: 0 10*3/uL (ref 0.0–0.1)
Basophil %: 0.4 %
EOS ABS: 0.2 10*3/uL (ref 0.0–0.7)
Eosinophil %: 2.1 %
HCT: 39.7 % — ABNORMAL LOW (ref 40.0–52.0)
HGB: 12.7 g/dL — ABNORMAL LOW (ref 13.0–18.0)
LYMPHS ABS: 0.7 10*3/uL — AB (ref 1.0–3.6)
Lymphocyte %: 7.5 %
MCH: 29.8 pg (ref 26.0–34.0)
MCHC: 32.1 g/dL (ref 32.0–36.0)
MCV: 93 fL (ref 80–100)
Monocyte #: 0.9 x10 3/mm (ref 0.2–1.0)
Monocyte %: 9.5 %
Neutrophil #: 7.4 10*3/uL — ABNORMAL HIGH (ref 1.4–6.5)
Neutrophil %: 80.5 %
Platelet: 160 10*3/uL (ref 150–440)
RBC: 4.27 10*6/uL — AB (ref 4.40–5.90)
RDW: 15.3 % — ABNORMAL HIGH (ref 11.5–14.5)
WBC: 9.1 10*3/uL (ref 3.8–10.6)

## 2014-03-14 LAB — PRO B NATRIURETIC PEPTIDE: B-Type Natriuretic Peptide: 1934 pg/mL — ABNORMAL HIGH (ref 0–450)

## 2014-03-15 LAB — CBC WITH DIFFERENTIAL/PLATELET
BASOS ABS: 0 10*3/uL (ref 0.0–0.1)
Basophil %: 0.3 %
EOS PCT: 2.7 %
Eosinophil #: 0.2 10*3/uL (ref 0.0–0.7)
HCT: 39.1 % — ABNORMAL LOW (ref 40.0–52.0)
HGB: 12.4 g/dL — ABNORMAL LOW (ref 13.0–18.0)
LYMPHS PCT: 9.9 %
Lymphocyte #: 0.7 10*3/uL — ABNORMAL LOW (ref 1.0–3.6)
MCH: 29.3 pg (ref 26.0–34.0)
MCHC: 31.7 g/dL — ABNORMAL LOW (ref 32.0–36.0)
MCV: 92 fL (ref 80–100)
Monocyte #: 0.8 x10 3/mm (ref 0.2–1.0)
Monocyte %: 10.2 %
NEUTROS ABS: 5.7 10*3/uL (ref 1.4–6.5)
NEUTROS PCT: 76.9 %
Platelet: 163 10*3/uL (ref 150–440)
RBC: 4.23 10*6/uL — ABNORMAL LOW (ref 4.40–5.90)
RDW: 15.8 % — AB (ref 11.5–14.5)
WBC: 7.4 10*3/uL (ref 3.8–10.6)

## 2014-03-15 LAB — BASIC METABOLIC PANEL
Anion Gap: 3 — ABNORMAL LOW (ref 7–16)
BUN: 42 mg/dL — ABNORMAL HIGH (ref 7–18)
CHLORIDE: 99 mmol/L (ref 98–107)
CO2: 36 mmol/L — AB (ref 21–32)
Calcium, Total: 8.6 mg/dL (ref 8.5–10.1)
Creatinine: 2.68 mg/dL — ABNORMAL HIGH (ref 0.60–1.30)
EGFR (African American): 26 — ABNORMAL LOW
EGFR (Non-African Amer.): 22 — ABNORMAL LOW
GLUCOSE: 130 mg/dL — AB (ref 65–99)
Osmolality: 288 (ref 275–301)
POTASSIUM: 3.9 mmol/L (ref 3.5–5.1)
SODIUM: 138 mmol/L (ref 136–145)

## 2014-03-18 ENCOUNTER — Telehealth: Payer: Self-pay | Admitting: *Deleted

## 2014-03-18 ENCOUNTER — Encounter: Payer: Self-pay | Admitting: Internal Medicine

## 2014-03-18 NOTE — Telephone Encounter (Signed)
Please call Heart failure nurse regarding an EF that the patient had.

## 2014-03-19 NOTE — Telephone Encounter (Signed)
Spoke w/ [place] at number provided.  She states that she is unfamiliar w/ Museum/gallery conservator who works there and that they do not have pt listed as a member.

## 2014-05-06 ENCOUNTER — Ambulatory Visit (INDEPENDENT_AMBULATORY_CARE_PROVIDER_SITE_OTHER): Payer: Medicare Other | Admitting: *Deleted

## 2014-05-06 ENCOUNTER — Telehealth: Payer: Self-pay | Admitting: Cardiology

## 2014-05-06 DIAGNOSIS — I495 Sick sinus syndrome: Secondary | ICD-10-CM

## 2014-05-06 NOTE — Telephone Encounter (Signed)
Spoke with pt and reminded pt of remote transmission that is due today. Pt verbalized understanding.   

## 2014-05-06 NOTE — Progress Notes (Signed)
Remote pacmaker transmission.

## 2014-05-09 LAB — MDC_IDC_ENUM_SESS_TYPE_REMOTE
Battery Impedance: 156 Ohm
Battery Remaining Longevity: 127 mo
Battery Voltage: 2.8 V
Brady Statistic AP VP Percent: 0 %
Brady Statistic AP VS Percent: 98 %
Brady Statistic AS VP Percent: 2 %
Date Time Interrogation Session: 20150909160702
Lead Channel Impedance Value: 423 Ohm
Lead Channel Impedance Value: 460 Ohm
Lead Channel Pacing Threshold Amplitude: 0.625 V
Lead Channel Pacing Threshold Pulse Width: 0.4 ms
Lead Channel Setting Pacing Amplitude: 2 V
Lead Channel Setting Pacing Amplitude: 2.5 V
Lead Channel Setting Pacing Pulse Width: 0.4 ms
MDC IDC MSMT LEADCHNL RV PACING THRESHOLD AMPLITUDE: 1 V
MDC IDC MSMT LEADCHNL RV PACING THRESHOLD PULSEWIDTH: 0.4 ms
MDC IDC MSMT LEADCHNL RV SENSING INTR AMPL: 16 mV
MDC IDC SET LEADCHNL RV SENSING SENSITIVITY: 5.6 mV
MDC IDC STAT BRADY AS VS PERCENT: 0 %

## 2014-05-11 ENCOUNTER — Other Ambulatory Visit: Payer: Self-pay | Admitting: *Deleted

## 2014-05-11 MED ORDER — CARVEDILOL 6.25 MG PO TABS: 6.2500 mg | ORAL_TABLET | Freq: Every day | ORAL | Status: DC

## 2014-05-11 NOTE — Telephone Encounter (Signed)
Requested Prescriptions   Signed Prescriptions Disp Refills  . carvedilol (COREG) 6.25 MG tablet 30 tablet 3    Sig: Take 1 tablet (6.25 mg total) by mouth daily.    Authorizing Provider: Deboraha Sprang    Ordering User: Britt Bottom

## 2014-06-01 ENCOUNTER — Encounter: Payer: Self-pay | Admitting: Cardiology

## 2014-06-09 ENCOUNTER — Encounter: Payer: Self-pay | Admitting: Internal Medicine

## 2014-06-16 ENCOUNTER — Encounter: Payer: Self-pay | Admitting: Cardiology

## 2014-06-21 ENCOUNTER — Other Ambulatory Visit: Payer: Self-pay | Admitting: Cardiovascular Disease

## 2014-06-22 ENCOUNTER — Ambulatory Visit (INDEPENDENT_AMBULATORY_CARE_PROVIDER_SITE_OTHER): Payer: Medicare Other | Admitting: Cardiovascular Disease

## 2014-06-22 ENCOUNTER — Encounter: Payer: Self-pay | Admitting: Cardiovascular Disease

## 2014-06-22 VITALS — BP 132/80 | HR 71 | Ht 72.0 in | Wt 255.5 lb

## 2014-06-22 DIAGNOSIS — E785 Hyperlipidemia, unspecified: Secondary | ICD-10-CM

## 2014-06-22 DIAGNOSIS — I10 Essential (primary) hypertension: Secondary | ICD-10-CM

## 2014-06-22 DIAGNOSIS — I4892 Unspecified atrial flutter: Secondary | ICD-10-CM

## 2014-06-22 DIAGNOSIS — I2510 Atherosclerotic heart disease of native coronary artery without angina pectoris: Secondary | ICD-10-CM

## 2014-06-22 DIAGNOSIS — I270 Primary pulmonary hypertension: Secondary | ICD-10-CM

## 2014-06-22 DIAGNOSIS — G4733 Obstructive sleep apnea (adult) (pediatric): Secondary | ICD-10-CM

## 2014-06-22 DIAGNOSIS — I4891 Unspecified atrial fibrillation: Secondary | ICD-10-CM

## 2014-06-22 DIAGNOSIS — I272 Pulmonary hypertension, unspecified: Secondary | ICD-10-CM

## 2014-06-22 MED ORDER — ISOSORBIDE DINITRATE 30 MG PO TABS: ORAL_TABLET | ORAL | Status: DC

## 2014-06-22 NOTE — Assessment & Plan Note (Signed)
Compliant with his CPAP.

## 2014-06-22 NOTE — Assessment & Plan Note (Signed)
Blood pressure is well controlled on today's visit. No changes made to the medications. 

## 2014-06-22 NOTE — Progress Notes (Signed)
Patient ID: Marco Moll Sr., male    DOB: 06-19-1936, 78 y.o.   MRN: [id number]  HPI Comments: Marco [name] is a very pleasant 78 year old gentleman with a history of coronary artery disease, CABG, atrial fibrillation and DVT, on warfarin, diabetes, previous smoking history PVD, carotid endarterectomy, sick sinus syndrome who  presented to the hospital at [facility] after being seen by Dr. Richardson Landry of ear nose throat with increasing shortness of breath, oxygen saturations to the mid 80s on room air. Cardiology was consulted for systolic and diastolic CHF.  Pacemaker is followed by Dr. [name]  He has LIMA to the LAD, vein graft to the circumflex, vein graft to the PL in June 2006 Also has mitral valve her care with #28 Edwards logic ring S/p Pacemaker placement AAA repair May 2008  left hand weakness started in December 2014.  carotid angiogram showed patent right carotid endarterectomy site, moderate disease on the left etiology of his symptoms was unclear.  In followup today, he reports blood pressure has been relatively well-controlled. His weight has significantly dropped through diet and exercise. 152 pounds on his scale Hemoglobin A1c has decreased from 7.6 down to 6.2. He is using less insulin, down from 15 units now 5-8 units twice a day He works out with aerobic and weights Reports having less shortness of breath carotid disease monitored by Dr. Ronalee Belts.  He does wear compression hose for his chronic lower extremity edema  No chest pain or worsening shortness of breath.   Echocardiogram in the hospital 03/12/2012 showed moderate right ventricular pressures 50-60 mmHg consistent with moderate pulmonary hypertension, ejection fraction 30-35%, moderately dilated left atrium, dilated left ventricle.   laboratory creatinine 3.55, BUN 59,  total cholesterol 149, LDL 93, HDL 37  EKG shows  normal sinus rhythm with rate 71 beats per minute, intraventricular conduction delay  noted   Outpatient Encounter Prescriptions as of 06/22/2014  Medication Sig  . amiodarone (PACERONE) 200 MG tablet Take 1 tablet (200 mg total) by mouth daily.  [name] apixaban (ELIQUIS) 5 MG TABS tablet Take 1 tablet (5 mg total) by mouth 2 (two) times daily.  . calcitRIOL (ROCALTROL) 0.5 MCG capsule Take 0.5 mcg by mouth daily.  . carvedilol (COREG) 3.125 MG tablet Take 3.125 mg by mouth every morning.  . carvedilol (COREG) 6.25 MG tablet Take 6.25 mg by mouth at bedtime.  . cholecalciferol (VITAMIN D) 1000 UNITS tablet Take 1,000 Units by mouth daily.  [name] docusate sodium (COLACE) 100 MG capsule Take 100 mg by mouth 2 (two) times daily.    . furosemide (LASIX) 40 MG tablet Take 1 tablet (40 mg total) by mouth daily.  . hydrALAZINE (APRESOLINE) 25 MG tablet Take 12.5 mg by mouth 2 (two) times daily.  . isosorbide dinitrate (ISORDIL) 30 MG tablet take 1/2 tablet by mouth every morning and 1 tablet at bedtime  . Multiple Vitamins-Minerals (MULTI-VITAMIN GUMMIES) CHEW Chew by mouth daily.  . NON FORMULARY 1.5 liters  . NOVOLOG MIX 70/30 (70-30) 100 UNIT/ML injection Inject 15 Units into the skin 2 (two) times daily with a meal. As directed.  [name] omeprazole (PRILOSEC) 20 MG capsule Take 20 mg by mouth every Monday, Wednesday, and Friday. Taking 3 times a week.  . rosuvastatin (CRESTOR) 20 MG tablet Take 1 tablet (20 mg total) by mouth daily.  . Tamsulosin HCl (FLOMAX) 0.4 MG CAPS Take 0.4 mg by mouth at bedtime. 1 po daily    Review of Systems  Constitutional: Negative.  HENT: Negative.   Eyes: Negative.   Respiratory: Negative.   Cardiovascular: Negative.   Gastrointestinal: Negative.   Endocrine: Negative.   Musculoskeletal: Negative.   Skin: Negative.   Allergic/Immunologic: Negative.   Neurological: Negative.   Hematological: Negative.   Psychiatric/Behavioral: Negative.   All other systems reviewed and are negative.   BP 132/80  Pulse 71  Ht 6' (1.829 m)  Wt 255 lb 8 oz (115.894  kg)  BMI 34.64 kg/m2  Physical Exam  Nursing note and vitals reviewed. Constitutional: He is oriented to person, place, and time. He appears well-developed and well-nourished.  Obese  HENT:  Head: Normocephalic.  Nose: Nose normal.  Mouth/Throat: Oropharynx is clear and moist.  Eyes: Conjunctivae are normal. Pupils are equal, round, and reactive to light.  Neck: Normal range of motion. Neck supple. No JVD present.  Cardiovascular: Normal rate, regular rhythm, S1 normal, S2 normal, normal heart sounds and intact distal pulses.  Exam reveals no gallop and no friction rub.   No murmur heard. Pulmonary/Chest: Effort normal and breath sounds normal. No respiratory distress. He has no wheezes. He has no rales. He exhibits no tenderness.  Abdominal: Soft. Bowel sounds are normal. He exhibits no distension. There is no tenderness.  Musculoskeletal: Normal range of motion. He exhibits no edema and no tenderness.  Lymphadenopathy:    He has no cervical adenopathy.  Neurological: He is alert and oriented to person, place, and time. Coordination normal.  Skin: Skin is warm and dry. No rash noted. No erythema.  Psychiatric: He has a normal mood and affect. His behavior is normal. Judgment and thought content normal.      Assessment and Plan

## 2014-06-22 NOTE — Assessment & Plan Note (Signed)
Currently with no symptoms of angina. No further workup at this time. Continue current medication regimen. 

## 2014-06-22 NOTE — Assessment & Plan Note (Signed)
He appears relatively euvolemic on today's visit. No taking low-dose Lasix 40 mg daily.

## 2014-06-22 NOTE — Assessment & Plan Note (Signed)
Congratulated him on his efforts for weight loss

## 2014-06-22 NOTE — Assessment & Plan Note (Signed)
Normal sinus rhythm on today's visit, first degree AV block. Tolerating anticoagulation 

## 2014-06-22 NOTE — Patient Instructions (Addendum)
You are doing well. No medication changes were made.  Please call us if you have new issues that need to be addressed before your next appt.  Your physician wants you to follow-up in: 6 months.  You will receive a reminder letter in the mail two months in advance. If you don't receive a letter, please call our office to schedule the follow-up appointment.  Your next appointment will be scheduled in our new office located at :  [facility]  [address], [address]  [place], [place] [address]  

## 2014-07-02 LAB — BASIC METABOLIC PANEL
BUN: 33 mg/dL — AB (ref 4–21)
Creatinine: 2.3 mg/dL — AB (ref <=1.3)
Glucose: 132 mg/dL
POTASSIUM: 4.7 mmol/L (ref 3.4–5.3)
Sodium: 143 mmol/L (ref 137–147)

## 2014-07-02 LAB — LIPID PANEL
Cholesterol: 140 mg/dL (ref 0–200)
HDL: 45 mg/dL (ref 35–70)
LDL Cholesterol: 74 mg/dL
LDl/HDL Ratio: 1.6
Triglycerides: 105 mg/dL (ref 40–160)

## 2014-08-06 ENCOUNTER — Encounter (HOSPITAL_COMMUNITY): Payer: Self-pay | Admitting: Internal Medicine

## 2014-08-10 ENCOUNTER — Telehealth: Payer: Self-pay | Admitting: Cardiology

## 2014-08-10 ENCOUNTER — Ambulatory Visit (INDEPENDENT_AMBULATORY_CARE_PROVIDER_SITE_OTHER): Payer: Medicare Other | Admitting: *Deleted

## 2014-08-10 DIAGNOSIS — I495 Sick sinus syndrome: Secondary | ICD-10-CM

## 2014-08-10 LAB — MDC_IDC_ENUM_SESS_TYPE_REMOTE
Battery Impedance: 157 Ohm
Battery Voltage: 2.8 V
Brady Statistic AP VP Percent: 0 %
Brady Statistic AP VS Percent: 96 %
Brady Statistic AS VP Percent: 3 %
Brady Statistic AS VS Percent: 0 %
Lead Channel Impedance Value: 460 Ohm
Lead Channel Pacing Threshold Amplitude: 1.125 V
Lead Channel Pacing Threshold Pulse Width: 0.4 ms
Lead Channel Pacing Threshold Pulse Width: 0.4 ms
Lead Channel Setting Pacing Amplitude: 2.5 V
Lead Channel Setting Pacing Pulse Width: 0.4 ms
Lead Channel Setting Sensing Sensitivity: 5.6 mV
MDC IDC MSMT BATTERY REMAINING LONGEVITY: 127 mo
MDC IDC MSMT LEADCHNL RA PACING THRESHOLD AMPLITUDE: 0.625 V
MDC IDC MSMT LEADCHNL RV IMPEDANCE VALUE: 440 Ohm
MDC IDC MSMT LEADCHNL RV SENSING INTR AMPL: 11.2 mV
MDC IDC SESS DTM: 20151214195309
MDC IDC SET LEADCHNL RA PACING AMPLITUDE: 2 V

## 2014-08-10 NOTE — Telephone Encounter (Signed)
Spoke with pt and reminded pt of remote transmission that is due today. Pt verbalized understanding.   

## 2014-08-10 NOTE — Progress Notes (Signed)
Remote pacemaker transmission.   

## 2014-08-29 ENCOUNTER — Other Ambulatory Visit: Payer: Self-pay | Admitting: Cardiovascular Disease

## 2014-09-03 ENCOUNTER — Encounter: Payer: Self-pay | Admitting: Cardiology

## 2014-09-05 ENCOUNTER — Other Ambulatory Visit: Payer: Self-pay | Admitting: Cardiovascular Disease

## 2014-09-07 ENCOUNTER — Other Ambulatory Visit: Payer: Self-pay | Admitting: *Deleted

## 2014-09-07 MED ORDER — CARVEDILOL 3.125 MG PO TABS
3.1250 mg | ORAL_TABLET | Freq: Every morning | ORAL | Status: DC
Start: 2014-09-07 — End: 2015-08-21

## 2014-09-09 ENCOUNTER — Encounter: Payer: Self-pay | Admitting: Internal Medicine

## 2014-09-12 DIAGNOSIS — I509 Heart failure, unspecified: Secondary | ICD-10-CM | POA: Diagnosis not present

## 2014-09-14 DIAGNOSIS — N2581 Secondary hyperparathyroidism of renal origin: Secondary | ICD-10-CM | POA: Diagnosis not present

## 2014-09-14 DIAGNOSIS — R600 Localized edema: Secondary | ICD-10-CM | POA: Diagnosis not present

## 2014-09-14 DIAGNOSIS — N183 Chronic kidney disease, stage 3 (moderate): Secondary | ICD-10-CM | POA: Diagnosis not present

## 2014-09-14 DIAGNOSIS — I10 Essential (primary) hypertension: Secondary | ICD-10-CM | POA: Diagnosis not present

## 2014-09-14 DIAGNOSIS — N184 Chronic kidney disease, stage 4 (severe): Secondary | ICD-10-CM | POA: Diagnosis not present

## 2014-09-17 ENCOUNTER — Encounter: Payer: Self-pay | Admitting: Cardiology

## 2014-09-22 DIAGNOSIS — N2581 Secondary hyperparathyroidism of renal origin: Secondary | ICD-10-CM | POA: Diagnosis not present

## 2014-09-22 DIAGNOSIS — R600 Localized edema: Secondary | ICD-10-CM | POA: Diagnosis not present

## 2014-09-22 DIAGNOSIS — I10 Essential (primary) hypertension: Secondary | ICD-10-CM | POA: Diagnosis not present

## 2014-09-22 DIAGNOSIS — N184 Chronic kidney disease, stage 4 (severe): Secondary | ICD-10-CM | POA: Diagnosis not present

## 2014-09-22 DIAGNOSIS — E1122 Type 2 diabetes mellitus with diabetic chronic kidney disease: Secondary | ICD-10-CM | POA: Diagnosis not present

## 2014-09-24 DIAGNOSIS — M5136 Other intervertebral disc degeneration, lumbar region: Secondary | ICD-10-CM | POA: Diagnosis not present

## 2014-09-24 DIAGNOSIS — M9901 Segmental and somatic dysfunction of cervical region: Secondary | ICD-10-CM | POA: Diagnosis not present

## 2014-09-24 DIAGNOSIS — M5033 Other cervical disc degeneration, cervicothoracic region: Secondary | ICD-10-CM | POA: Diagnosis not present

## 2014-09-24 DIAGNOSIS — M9903 Segmental and somatic dysfunction of lumbar region: Secondary | ICD-10-CM | POA: Diagnosis not present

## 2014-09-29 ENCOUNTER — Encounter: Payer: Self-pay | Admitting: Internal Medicine

## 2014-09-29 ENCOUNTER — Ambulatory Visit (INDEPENDENT_AMBULATORY_CARE_PROVIDER_SITE_OTHER): Payer: Medicare Other | Admitting: Internal Medicine

## 2014-09-29 VITALS — BP 136/80 | HR 75 | Ht 72.0 in | Wt 254.5 lb

## 2014-09-29 DIAGNOSIS — Z0000 Encounter for general adult medical examination without abnormal findings: Secondary | ICD-10-CM | POA: Diagnosis not present

## 2014-09-29 DIAGNOSIS — I4892 Unspecified atrial flutter: Secondary | ICD-10-CM | POA: Diagnosis not present

## 2014-09-29 DIAGNOSIS — I4891 Unspecified atrial fibrillation: Secondary | ICD-10-CM | POA: Diagnosis not present

## 2014-09-29 DIAGNOSIS — I495 Sick sinus syndrome: Secondary | ICD-10-CM

## 2014-09-29 DIAGNOSIS — Z23 Encounter for immunization: Secondary | ICD-10-CM | POA: Diagnosis not present

## 2014-09-29 LAB — MDC_IDC_ENUM_SESS_TYPE_INCLINIC
Battery Impedance: 157 Ohm
Battery Voltage: 2.8 V
Brady Statistic AP VS Percent: 95 %
Brady Statistic AS VP Percent: 3 %
Date Time Interrogation Session: 20160202125256
Lead Channel Pacing Threshold Amplitude: 0.5 V
Lead Channel Pacing Threshold Pulse Width: 0.4 ms
Lead Channel Sensing Intrinsic Amplitude: 1 mV
Lead Channel Setting Pacing Pulse Width: 0.4 ms
MDC IDC MSMT BATTERY REMAINING LONGEVITY: 127 mo
MDC IDC MSMT LEADCHNL RA IMPEDANCE VALUE: 479 Ohm
MDC IDC MSMT LEADCHNL RA PACING THRESHOLD PULSEWIDTH: 0.4 ms
MDC IDC MSMT LEADCHNL RV IMPEDANCE VALUE: 470 Ohm
MDC IDC MSMT LEADCHNL RV PACING THRESHOLD AMPLITUDE: 1 V
MDC IDC MSMT LEADCHNL RV SENSING INTR AMPL: 15.67 mV
MDC IDC SET LEADCHNL RA PACING AMPLITUDE: 2 V
MDC IDC SET LEADCHNL RV PACING AMPLITUDE: 2.5 V
MDC IDC SET LEADCHNL RV SENSING SENSITIVITY: 5.6 mV
MDC IDC STAT BRADY AP VP PERCENT: 1 %
MDC IDC STAT BRADY AS VS PERCENT: 1 %

## 2014-09-29 NOTE — Patient Instructions (Signed)
Remote monitoring is used to monitor your Pacemaker of ICD from home. This monitoring reduces the number of office visits required to check your device to one time per year. It allows [place] to keep an eye on the functioning of your device to ensure it is working properly. You are scheduled for a device check from home on 12/29/14. You may send your transmission at any time that day. If you have a wireless device, the transmission will be sent automatically. After your physician reviews your transmission, you will receive a postcard with your next transmission date.   Your physician wants you to follow-up in: 1 year with Dr. [name]. You will receive a reminder letter in the mail two months in advance. If you don't receive a letter, please call our office to schedule the follow-up appointment.  Your physician recommends that you take the following orders with you to be completed at your next lab draw:  TSH  Liver function

## 2014-09-29 NOTE — Progress Notes (Signed)
Patient Care Team: Miguel Aschoff, MD as PCP - General (Unknown Physician Specialty) Minus Breeding, MD (Cardiology)   HPI  Marco Moll Sr. is a 79 y.o. male Seen in follow-up for a pacemaker implanted for tachybradycardia syndrome. He has a history of atrial arrhythmias both fibrillation and flutter. He has  ischemic cardiomyopathy with prior bypass surgery and PCI. He also has remote mitral valve repair.   He has a history of atrial fibrillation implantatedwith a Medtronic dual-chamber device for sick sinus syndrome.[name] He was seen in February 2014  urgently because of symptomatic tachypalpitations. He underwent cardioversion with TE . Most recent ejection fraction at the time of TEE 2/14--25-30% with evidence of pulmonary hypertension and biatrial enlargement    He recently had enalapril initiated K 9/14 >>5.7 and Cr 3.14,    Enalapril was discontinued. Last creatinine from 10/14 was 3.55  He remains on apixaban 5 mg twice daily; his age of 61  He does not walk very much. He does exercise in a chair for about an hour a day. He notes no significant untoward with this. He denies chest pain. He has not had much peripheral edema. No nocturnal dyspnea   Past Medical History  Diagnosis Date  . Cardiomyopathy -mixed     EF 30-35% -- 7/13; Pulm HTN and modest RV dysfunction  . Coronary artery disease      The patient had stent to the right coronary artery in 1999.  The patient had unstable angina.  Non-Q-wave myocardial infarction in June 2006.  Coronary artery bypass graft surgery on February 01, 2005, with  mitral valve annuloplasty with   . Sick sinus syndrome   . Mitral regurgitation--s/p repair     placement of a 28-mm Edwards   ETlogix ring.     [name]      Generator replacement 10/13  . Diabetes mellitus   . Hypertension   . Hyperlipidemia   . BPH (benign prostatic hypertrophy)   . Renal insufficiency Gd 4   . GERD (gastroesophageal reflux disease)   .  Chronic systolic heart failure     New York Heart Association class II-III   . Obesity   . DVT (deep venous thrombosis)   . Anemia   . Sleep apnea     Not tolerant of CPAP  . Skin cancer 2013    [name] x 2 back and left neck  . AAA (abdominal aortic aneurysm)repaired 2008    repair  . Atrial fibrillation/Flutter     newly identified 2014  . PVC (premature ventricular contraction)     s/p RFCA JAllred 2011  . Carotid artery occlusion     Past Surgical History  Procedure Laterality Date  . Coronary artery bypass graft      CABG with a  LIMA to the LAD, SVG to circumflex, SVG to posterolateral June2006.  . Mitral valve repair  2006     mitral valve repair with a #28 Edwards logic ring  . Pacemaker placement    . Abdominal aortic aneurysm repair       May 2008,   . Cardiac catheterization  2006    @ [facility]  . Insert / replace / remove pacemaker  2006    Dr. [name]  . Skin cancer excision       x 6  . Cataract extraction      right  . Skin biopsy      left wrist; right elbow  . Transesophageal  echocardiogram    . Skin cancer excision    . Permanent pacemaker generator change N/A 06/26/2012    Procedure: PERMANENT PACEMAKER GENERATOR CHANGE;  Surgeon: Deboraha Sprang, MD;  Location: [facility] CATH LAB;  Service: Cardiovascular;  Laterality: N/A;    Current Outpatient Prescriptions  Medication Sig Dispense Refill  . amiodarone (PACERONE) 200 MG tablet take 1 tablet by mouth once daily 30 tablet 3  . apixaban (ELIQUIS) 5 MG TABS tablet Take 1 tablet (5 mg total) by mouth 2 (two) times daily. 60 tablet 11  . calcitRIOL (ROCALTROL) 0.5 MCG capsule Take 0.5 mcg by mouth daily.    . carvedilol (COREG) 3.125 MG tablet Take 1 tablet (3.125 mg total) by mouth every morning. 90 tablet 3  . carvedilol (COREG) 6.25 MG tablet Take 6.25 mg by mouth daily.   0  . cholecalciferol (VITAMIN D) 1000 UNITS tablet Take 1,000 Units by mouth daily.    [name] docusate sodium (COLACE) 100 MG capsule  Take 100 mg by mouth 2 (two) times daily.      . furosemide (LASIX) 40 MG tablet Take 1 tablet (40 mg total) by mouth daily. 30 tablet   . hydrALAZINE (APRESOLINE) 25 MG tablet Take 12.5 mg by mouth 2 (two) times daily.    . isosorbide dinitrate (ISORDIL) 30 MG tablet take 1/2 tablet by mouth every morning and 1 tablet at bedtime 45 tablet 3  . losartan (COZAAR) 25 MG tablet Take 25 mg by mouth 2 (two) times daily.   0  . Multiple Vitamins-Minerals (MULTI-VITAMIN GUMMIES) CHEW Chew by mouth daily.    . NON FORMULARY 1.5 liters    . NOVOLOG MIX 70/30 (70-30) 100 UNIT/ML injection Inject 15-16 Units into the skin 2 (two) times daily with a meal. As directed.    [name] omeprazole (PRILOSEC) 20 MG capsule Take 20 mg by mouth every Monday, Wednesday, and Friday. Taking 3 times a week.    . rosuvastatin (CRESTOR) 20 MG tablet Take 1 tablet (20 mg total) by mouth daily. 90 tablet 3  . Tamsulosin HCl (FLOMAX) 0.4 MG CAPS Take 0.4 mg by mouth at bedtime. 1 po daily     No current facility-administered medications for this visit.    Allergies  Allergen Reactions  . Shellfish Allergy Hives    Review of Systems negative except from HPI and PMH  Physical Exam BP 136/80 mmHg  Pulse 75  Ht 6' (1.829 m)  Wt 254 lb 8 oz (115.44 kg)  BMI 34.51 kg/m2 Well developed and morbidly obese Caucasian male in no acute distress HENT normal E scleral and icterus clear Neck Supplechief Clear to ausculation Device pocket well healed; without hematoma or erythema.  There is no tethering  regular rate and rhythm,   Soft with active bowel sounds No clubbing cyanosis  tr Edema Alert and oriented, there is weakness with finger extension and hypoventilating Skin Warm and Dry  ECG demonstrates sinus rhythm with frequent PACs and intrinsic conduction Intervals 22/12/40  Assessment and  Plan  Atrial fibrillation/flutter  Chronotropic incompetence/sinus node dysfunction  Ischemic cardiomyopathy  Congestive  heart failure-chronic-systolic  Pacemaker-Medtronic  High risk medication surveillance  Hypertension  Blood pressure is reasonably controlled.  We will reprogram his pacemaker to give him a daytime rate is 75 and a sleep rate of 60. This may help improve her exercise performance and energy levels.  He is euvolemic.  Without symptoms of ischemia  We will arrange amiodarone surveillance laboratories to be drawn later this  week

## 2014-10-01 ENCOUNTER — Other Ambulatory Visit: Payer: Self-pay | Admitting: Internal Medicine

## 2014-10-01 DIAGNOSIS — I4891 Unspecified atrial fibrillation: Secondary | ICD-10-CM | POA: Diagnosis not present

## 2014-10-01 DIAGNOSIS — I495 Sick sinus syndrome: Secondary | ICD-10-CM | POA: Diagnosis not present

## 2014-10-01 DIAGNOSIS — I4892 Unspecified atrial flutter: Secondary | ICD-10-CM | POA: Diagnosis not present

## 2014-10-01 DIAGNOSIS — Z125 Encounter for screening for malignant neoplasm of prostate: Secondary | ICD-10-CM | POA: Diagnosis not present

## 2014-10-01 LAB — PSA: PSA: 0.5

## 2014-10-02 LAB — HEPATIC FUNCTION PANEL
ALK PHOS: 61 IU/L (ref 39–117)
ALT: 18 IU/L (ref 0–44)
AST: 14 IU/L (ref 0–40)
Albumin: 3.8 g/dL (ref 3.5–4.8)
BILIRUBIN TOTAL: 0.3 mg/dL (ref 0.0–1.2)
Bilirubin, Direct: 0.09 mg/dL (ref 0.00–0.40)
Total Protein: 6.9 g/dL (ref 6.0–8.5)

## 2014-10-02 LAB — TSH: TSH: 1.92 u[IU]/mL (ref 0.450–4.500)

## 2014-10-03 ENCOUNTER — Other Ambulatory Visit: Payer: Self-pay | Admitting: Cardiovascular Disease

## 2014-10-05 ENCOUNTER — Other Ambulatory Visit: Payer: Self-pay

## 2014-10-05 MED ORDER — CARVEDILOL 6.25 MG PO TABS: 6.2500 mg | ORAL_TABLET | Freq: Every day | ORAL | Status: DC

## 2014-10-05 NOTE — Telephone Encounter (Signed)
Refill sent for carvedilol 6.25 mg  

## 2014-10-05 NOTE — Telephone Encounter (Signed)
Refill sent for carvedilol 6.25 mg take one tablet daily.

## 2014-10-13 DIAGNOSIS — I509 Heart failure, unspecified: Secondary | ICD-10-CM | POA: Diagnosis not present

## 2014-10-17 ENCOUNTER — Other Ambulatory Visit: Payer: Self-pay | Admitting: Cardiovascular Disease

## 2014-10-20 DIAGNOSIS — M9903 Segmental and somatic dysfunction of lumbar region: Secondary | ICD-10-CM | POA: Diagnosis not present

## 2014-10-20 DIAGNOSIS — M5033 Other cervical disc degeneration, cervicothoracic region: Secondary | ICD-10-CM | POA: Diagnosis not present

## 2014-10-20 DIAGNOSIS — M5136 Other intervertebral disc degeneration, lumbar region: Secondary | ICD-10-CM | POA: Diagnosis not present

## 2014-10-20 DIAGNOSIS — M9901 Segmental and somatic dysfunction of cervical region: Secondary | ICD-10-CM | POA: Diagnosis not present

## 2014-11-11 DIAGNOSIS — I509 Heart failure, unspecified: Secondary | ICD-10-CM | POA: Diagnosis not present

## 2014-11-17 DIAGNOSIS — M5136 Other intervertebral disc degeneration, lumbar region: Secondary | ICD-10-CM | POA: Diagnosis not present

## 2014-11-17 DIAGNOSIS — M5033 Other cervical disc degeneration, cervicothoracic region: Secondary | ICD-10-CM | POA: Diagnosis not present

## 2014-11-17 DIAGNOSIS — M9903 Segmental and somatic dysfunction of lumbar region: Secondary | ICD-10-CM | POA: Diagnosis not present

## 2014-11-17 DIAGNOSIS — M9901 Segmental and somatic dysfunction of cervical region: Secondary | ICD-10-CM | POA: Diagnosis not present

## 2014-11-24 DIAGNOSIS — Z23 Encounter for immunization: Secondary | ICD-10-CM | POA: Diagnosis not present

## 2014-11-24 DIAGNOSIS — E785 Hyperlipidemia, unspecified: Secondary | ICD-10-CM | POA: Diagnosis not present

## 2014-11-24 DIAGNOSIS — E1121 Type 2 diabetes mellitus with diabetic nephropathy: Secondary | ICD-10-CM | POA: Diagnosis not present

## 2014-11-24 DIAGNOSIS — I509 Heart failure, unspecified: Secondary | ICD-10-CM | POA: Diagnosis not present

## 2014-11-24 DIAGNOSIS — I10 Essential (primary) hypertension: Secondary | ICD-10-CM | POA: Diagnosis not present

## 2014-11-24 DIAGNOSIS — E669 Obesity, unspecified: Secondary | ICD-10-CM | POA: Diagnosis not present

## 2014-11-24 LAB — HEMOGLOBIN A1C: Hgb A1c MFr Bld: 6.1 % — AB (ref 4.0–6.0)

## 2014-12-12 DIAGNOSIS — I509 Heart failure, unspecified: Secondary | ICD-10-CM | POA: Diagnosis not present

## 2014-12-14 DIAGNOSIS — I10 Essential (primary) hypertension: Secondary | ICD-10-CM | POA: Diagnosis not present

## 2014-12-14 DIAGNOSIS — N2581 Secondary hyperparathyroidism of renal origin: Secondary | ICD-10-CM | POA: Diagnosis not present

## 2014-12-14 DIAGNOSIS — R600 Localized edema: Secondary | ICD-10-CM | POA: Diagnosis not present

## 2014-12-14 DIAGNOSIS — R319 Hematuria, unspecified: Secondary | ICD-10-CM | POA: Diagnosis not present

## 2014-12-14 DIAGNOSIS — N184 Chronic kidney disease, stage 4 (severe): Secondary | ICD-10-CM | POA: Diagnosis not present

## 2014-12-15 NOTE — Consult Note (Signed)
General Aspect 79 year old Caucasian gentleman with CAD, CABG, PVD, CEA, sick sinus syndrome,  who comes to the Emergency Room from Dr. Reola Mosher office at [facility] with increasing shortness of breath, productive cough, green-yellow phlegm along with his oxygen saturations at 86% on room air. Cardiology was consulted for systolic and diastolic CHF.  He reports increasing SOB x 1 week, worse over the weekend with incereasign LE edema, unable to luie flat secondary to SOB. Recent weight gain of a few pounds. He has been taking lasix 40 mg BID. He is on a new diet, drinking alot of protein shakes.   In the Emergency Room he received IV Lasix 20 mg with mildly improved sx. His sats were 95% on two liters. The patient was started on IV antibiotics and Nebs for possible COPD exacerbation with CHF.  this AM, he reports feeling 80% better. He has been monitoring his O2 sats on RA with pulseoxymeter. He has been 85% with ambulation. he does not use oxygen at home.   Non-Invasive Vascular Imaging CAROTID DUPLEX 02/08/2012   Right ICA 0 - 19% stenosis Left ICA 40 - 59 % stenosis   Past Surgical History   ???  Coronary artery bypass graft         CABG with a  LIMA to the LAD, SVG to circumflex, SVG to posterolateral June2006.   ???  Mitral valve repair          mitral valve repair with a #28 Edwards logic ring   ???  Pacemaker placement     ???  Abdominal aortic aneurysm repair     May 2008,    Present Illness . Past Medical History   ???  Cardiomyopathy     EF 40%   ???  Coronary artery disease     stent to the right coronary artery in 1999.  The patient had unstable angina.  Non-Q-wave myocardial infarction in June 2006.  Coronary artery bypass graft surgery on February 01, 2005, with  mitral valve annuloplasty with placement of a 28-mm Edwards   ETlogix ring.      ???  Arrhythmia          Sick sinus syndrome/sinus node dysfunction after coronary artery bypass graft/mitral valve replacement   ???   Mitral regurgitation     ???  Status post biventricular cardiac pacemaker insertion         Medtronic EnRhythm dual-chamber pacemaker implanted on February 09, 2005.    ???  Diabetes mellitus     ???  Hypertension     ???  Hyperlipidemia     ???  BPH (benign prostatic hypertrophy)     ???  Renal insufficiency     ???  GERD (gastroesophageal reflux disease)     ???  CHF (congestive heart failure)         New York Heart Association class II-III    ???  Obesity     ???  DVT (deep venous thrombosis)     ???  Anemia     ???  Sleep apnea         Not tolerant of CPAP   ???  Skin cancer  2013       [name] x 2 back and left neck   SOCIAL HISTORY: He lives at home with his wife. Denies smoking for the past 25 years. He is retired, not working.   FAMILY HISTORY: Positive for coronary artery disease. Brother died from coronary artery disease.  Physical Exam:   GEN well developed, well nourished, no acute distress, obese    HEENT red conjunctivae    NECK supple  No masses    RESP normal resp effort  decreased BS mildly throughout B/l    CARD Regular rate and rhythm  No murmur    ABD denies tenderness  soft    LYMPH negative neck    EXTR positive edema, RLE edema, Trace    SKIN normal to palpation    NEURO cranial nerves intact, motor/sensory function intact    PSYCH alert, A+O to time, place, person, good insight   Review of Systems:   Subjective/Chief Complaint SOB, cough    General: Fatigue    Skin: No Complaints    ENT: No Complaints    Eyes: No Complaints    Neck: No Complaints    Respiratory: Frequent cough  Short of breath    Cardiovascular: Dyspnea    Gastrointestinal: No Complaints    Genitourinary: No Complaints    Vascular: No Complaints    Musculoskeletal: No Complaints    Neurologic: No Complaints    Hematologic: No Complaints    Endocrine: No Complaints    Psychiatric: No Complaints    Review of Systems: All other systems were  reviewed and found to be negative    Medications/Allergies Reviewed Medications/Allergies reviewed   Home Medications: Medication Instructions Status  calcitriol 0.5 mcg oral capsule 1 cap(s) orally once a day Active  carvedilol 6.25 mg oral tablet 1 tab(s) orally 2 times a day Active  tamsulosin 0.4 mg oral capsule 1 cap(s) orally once a day (at bedtime) Active  Crestor 10 mg oral tablet 1 tab(s) orally once a day Active  amiodarone 200 mg oral tablet 0.5 tab(s) orally once a day Active  omeprazole 20 mg oral delayed release capsule 1 cap(s) orally 3 times a week Active  Colace 100 mg oral capsule 1 tab(s) orally 2 times a day Active  Ascriptin Enteric 81 mg tablet 1 tab(s) orally once a day Active  isosorbide dinitrate 30 mg oral tablet 1 tab(s) orally 2 times a day Active  Lasix 40 mg oral tablet 1 tab(s) orally 2 times a day Active  warfarin 2 mg oral tablet 1 tab(s) orally once a day. Skip Sunday and Wednesday Active  Pataday 0.2% ophthalmic solution 1 drop(s) to each affected eye once a day, As Needed for tearing Active  azelastine 137 mcg/inh ( 0.1%) nasal spray 2 spray(s) nasal once a day (at bedtime) Active  NovoLog Mix 70/30 15-16 unit(s) subcutaneous 2 times a day Active   Lab Results:  Routine Chem:  16-Jul-13 05:13    Glucose, Serum  124   BUN  49   Creatinine (comp)  3.00   Sodium, Serum 142   Potassium, Serum 3.7   Chloride, Serum 103   CO2, Serum  33   Calcium (Total), Serum 8.7   Anion Gap  6   Osmolality (calc) 298   eGFR (African American)  23   eGFR (Non-African American)  20 (eGFR values <52m/min/1.73 m2 may be an indication of chronic kidney disease (CKD). Calculated eGFR is useful in patients with stable renal function. The eGFR calculation will not be reliable in acutely ill patients when serum creatinine is changing rapidly. It is not useful in  patients on dialysis. The eGFR calculation may not be applicable to patients at the low and high extremes  of body sizes, pregnant women, and vegetarians.)   EKG:   Interpretation  EKG shows paced rhythm, rate 61 bpm    Shellfish: Anaphylaxis, Itching, Hives, Rash, Wheezing  Vital Signs/Nurse's Notes: **Vital Signs.:   16-Jul-13 08:19   Vital Signs Type Routine   Temperature Temperature (F) 98.4   Celsius 36.8   Temperature Source Oral   Pulse Pulse 60   Respirations Respirations 20   Systolic BP Systolic BP 950   Diastolic BP (mmHg) Diastolic BP (mmHg) 83   Mean BP 104   Pulse Ox % Pulse Ox % 97   Pulse Ox Activity Level  At rest   Oxygen Delivery 3L     Impression 79 year old Mr. Netz with:  1. Acute hypoxic respiratory distress: acute systolic/diastolic CHF, unable to exclude with early pneumonia.  --Would continue lasix as you are doing, IV Will monitor creatinine. Still around his baseline (creatinine 2.7 in 05/2011)  Cycle cardiac enzymes x3.  --Echo pending, to evaluate RVSP, systolic function  2) Sick sinus syndrome pacer, followed by [facility]  3.Possible COPD exacerbation, 35 years smoking hx,  Possible right lower lobe pneumonia.   on Rocephin and Zithromax. Blood cultures have been collected in the ER. on Nebs  4. CKD, stage IV.  Monitor creatinine closely, baseline is around 2.6 to 2.9.  Followed by [facility]  5. Type 2 diabetes. Continue NovoLog 70/30 b.i.d. along with sliding scale.   6. Hyperlipidemia.   on Crestor.   7. h/o DVt on warfarin   Electronic Signatures: Ida Rogue (MD)  (Signed 16-Jul-13 13:52)  Authored: General Aspect/Present Illness, History and Physical Exam, Review of System, Home Medications, Labs, EKG , Allergies, Vital Signs/Nurse's Notes, Impression/Plan   Last Updated: 16-Jul-13 13:52 by Ida Rogue (MD)

## 2014-12-15 NOTE — Op Note (Signed)
PATIENT NAME:  Marco [name], Marco [name] MR#:  [id number] DATE OF BIRTH:  11-18-1935  DATE OF PROCEDURE:  06/10/2012  PREOPERATIVE DIAGNOSIS: Cataract, right eye.   POSTOPERATIVE DIAGNOSIS: Cataract, right eye.   PROCEDURE PERFORMED: Extracapsular cataract extraction with placement of an Alcon [id number] 20.5 diopter posterior chamber lens with 2.25 diopters of cylinder, serial number [id number].   SURGEON: Loura Back. Deshayla Empson, M.D.   ANESTHESIA: 4% lidocaine and 0.75% Marcaine in a 50-50 mixture with 10 units/mL of Hylenex given as a peribulbar.   ANESTHESIOLOGIST: Dr. Kayleen Memos.   COMPLICATIONS: None.   ESTIMATED BLOOD LOSS: Less than 1 mL.   DESCRIPTION OF PROCEDURE: The patient was brought to the Operating Room and each eye was anesthetized with topical proparacaine. The patient sat up, fixated on a distant target. The 3 and 9:00 positions were marked using an [place] toric marker. The patient was placed supine, given IV sedation and a peribulbar block. He was then prepped and draped in the usual fashion. Vertical rectus muscles were imbricated using 5-0 silk sutures, bridle sutures. The toric marker was used to mark the 92 degree mark in the eye after centering it on the 3 and 9:00 marks. The conjunctiva was opened, centered on 95 degrees and hemostasis obtained with cautery. A partial thickness scleral groove was made at the posterior surgical limbus and dissected anteriorly into clear cornea with an Alcon crescent knife. The anterior chamber was entered superonasally through clear cornea using a paracentesis knife and through the lamellar dissection with a 2.6 mm keratome. DisCoVisc was used to replace the aqueous and a continuous tear circular capsulorrhexis was carried out. Hydrodelineation was used to loosen the nucleus and phacoemulsification was carried out in a divide and conquer technique. Ultrasound time was 2 minutes and 4 seconds with an average power of 18.3%. CDE 38.07. Irrigation/aspiration  was used to remove the residual DisCoVisc. The intraocular lens was inserted using a Librarian, academic. Care was taken to align the marks on the haptics with the 92 degrees marks. Irrigation-aspiration was used to remove the residual DisCoVisc and the position of the lens was confirmed. The wound was inflated with balanced salt and Miostat was injected through the paracentesis track to deepen the chamber and induce miosis. The wound was checked for leaks. None were found. The conjunctiva was then closed with cautery. The bridle sutures were removed and two drops of Vigamox were placed in the eye. A shield was placed on the eye and the patient was discharged to the recovery room in good condition.   ____________________________ Loura Back Chyenne Sobczak, MD sad:ap D: 06/10/2012 14:04:42 ET T: 06/10/2012 15:07:28 ET JOB#: [id number]  cc: Remo Lipps A. Shye Doty, MD, <Dictator> Martie Lee MD ELECTRONICALLY SIGNED 06/17/2012 13:36

## 2014-12-15 NOTE — H&P (Signed)
PATIENT NAME:  Marco [name], STAHL MR#:  [id number] DATE OF BIRTH:  [birth year]  DATE OF ADMISSION:  03/11/2012  PRIMARY CARE PHYSICIAN:  Dr. Miguel Aschoff. CARDIOLOGIST: [facility], Dr. Percival Spanish.   CHIEF COMPLAINT: Shortness of breath, cough and congestion.   HISTORY OF PRESENT ILLNESS: [name] is a 79 year old very pleasant Caucasian gentleman with multiple medical problems who comes to the Emergency Room from Dr. Reola Mosher office at [facility] with increasing shortness of breath, productive cough, green-yellow phlegm along with his oxygen saturations at 86% on room air. The patient was seen this morning for the above symptoms. He thought it was his sinuses acting up, however, was found to be in congestive heart failure with possible early right lower lobe pneumonia. He is being admitted for further evaluation and management. In the Emergency Room he received IV Lasix 20 mg. He feels already better. His sats during my evaluation were 95% on two liters. The patient is going to get blood cultures drawn and be started on IV antibiotics.   PAST MEDICAL HISTORY:  1. Congestive heart failure, both systolic and diastolic with ejection fraction of 50% in December 2012.  2. History of influenza A.  3. Hypertension.  4. Type 2 diabetes.  5. Coronary artery disease status post coronary artery bypass graft.  6. Chronic atrial fibrillation, on Coumadin and pacemaker.  7. CKD stage IV. Baseline creatinine is around 2.6 to 2.9.  8. History of left lower lobe pneumonia in the past.   ALLERGIES: Shellfish.   MEDICATIONS:  1. Amiodarone 100 mg daily.  2. Aspirin 81 mg daily.  3. Azelastine 137 mcg nasal spray two sprays at bedtime.  4. Calcitriol 0.5 mcg p.o. daily.  5. Carvedilol 6.25 b.i.d.  6. Colace 100 mg b.i.d.  7. Crestor 10 mg daily.  8. Isosorbide dinitrate 30 mg b.i.d.  9. Lasix 40 mg twice a day.  10. NovoLog 70/30, 15 units twice a day.  11. Omeprazole 20 mg 3 times a week.   12. Pataday 0.2% ophthalmic solution one drop each affected eye once a day.  13. Tamsulosin 0.4 mg at bedtime.  14. Warfarin 2 mg once a day, skip Wednesdays and Sundays.   PAST SURGICAL HISTORY:  1. Coronary artery bypass graft. 2. Abdominal aortic aneurysm repair.  3. Pacemaker placement.  4. Multiple resections for skin cancer.   SOCIAL HISTORY: He lives at home with his wife. Denies smoking for the past 25 years. He is retired, not working.   FAMILY HISTORY: Positive for coronary artery disease. Brother died from coronary artery disease.   REVIEW OF SYSTEMS: CONSTITUTIONAL: Positive for fatigue and weakness. No fever. EYES: No blurred or double vision or glaucoma. ENT: No tinnitus or ear pain. Positive for sinusitis. RESPIRATORY: Positive for cough and productive phlegm along with shortness of breath. CARDIOVASCULAR: Positive for increasing shortness of breath and leg edema. No chest pain. GASTROINTESTINAL: No nausea, vomiting, diarrhea, or abdominal pain. GU: No dysuria, hematuria, or frequency. ENDOCRINE: No polyuria, nocturia, or thyroid problems. SKIN: No acne or rash. MUSCULOSKELETAL: Positive for arthritis. NEUROLOGIC: No cerebrovascular accident or transient ischemic attack. PSYCH: No anxiety or depression. All other systems reviewed and negative.   PHYSICAL EXAMINATION:  GENERAL: The patient is awake, alert, oriented x3, not in acute distress.   VITAL SIGNS: Afebrile, pulse is 59 to 60, regular paced rhythm. Blood pressure 142/76, sats 95% on 2 liters.   HEENT: Atraumatic, normocephalic. Pupils are equal, round, and reactive to light and accommodation. Extraocular movements  intact. Oral mucosa is moist.   NECK: Supple. No JVD. No carotid bruit.   RESPIRATORY: Decreased breath sounds in the bases. There are bibasilar crackles heard, more on the right than the left. No respiratory distress or labored breathing.   CARDIOVASCULAR: Both the heart sounds are normal. Rate and rhythm  is regular. PMI not lateralized. Chest nontender. No murmur heard. Leg edema.   EXTREMITIES: Good pedal pulses. The patient has pitting edema up to the knee joint, bilaterally 2+. Good femoral pulses felt.  ABDOMEN: Soft, benign, and nontender. No organomegaly. Distended, soft, nontender. No organomegaly appreciated.   NEUROLOGIC: Grossly intact cranial nerves II through XII. No motor or sensory deficits.   PSYCH: The patient is awake, alert, oriented x3.   SKIN: Warm and dry.   LABORATORY, RADIOLOGICAL AND DIAGNOSTIC DATA: White count is 7.4, hemoglobin and hematocrit 13.0 and 39.9, platelet count 213, MCV 91. Glucose 127, BUN 49, creatinine 2.9, sodium 143, potassium 3.9, chloride 104, bicarbonate 33. PT-INR and BNP are pending. Troponin first set is negative. B-type natriuretic peptide is 1,276. EKG shows pacemaker changes, sinus rhythm.   ASSESSMENT: 79 year old [name] with:  1. Acute hypoxic respiratory failure likely due to congestive heart failure, acute systolic/diastolic with suspected early right lower lobe pneumonia. The patient presented with not feeling well with cough and congestion along with productive cough, sats down to 86 at room air. No fever. Increasing leg edema, however, no significant weight gain per patient. He is intentionally trying to lose weight by dieting and exercising. We will admit the patient to 2A. FULL CODE. Get blood cultures x2. Start the patient on two gram sodium, ADA, 1,800-calorie diet, sliding scale insulin. Will start the patient on Lasix 20 mg t.i.d. Watch ins and outs, daily weights and creatinine closely. Cardiology consultation with Dr. Rockey Situ, but repeat an echocardiogram of the heart. Cycle cardiac enzymes x3.  2. Chronic atrial fibrillation, on Coumadin status post pacemaker. Check PT-INR. Adjust Coumadin dosage accordingly.  3. Suspected early right lower lobe pneumonia. We will start the patient empirically on Rocephin and Zithromax. Blood  cultures have been collected in the ER. Send sputum for culture and sensitivities and do nebulizer treatment around-the-clock.  4. CKD, stage IV. Monitor creatinine closely, baseline is around 2.6 to 2.9. We will consider nephrology consultation if needed. The patient will benefit from nephrology consultation as an outpatient. Follow up as outpatient as well.  5. Type 2 diabetes. Continue NovoLog 70/30 b.i.d. along with sliding scale.  6. Hyperlipidemia. The patient is already on Crestor.  7. Deep vein thrombosis prophylaxis with heparin.   The above was discussed with the patient who is agreeable for admission. Further work-up according to the patient's clinical course. The patient's case was discussed with Dr. Rockey Situ who will see the patient in consultation.   TIME SPENT: 55 minutes.   ____________________________ Hart Rochester Posey Pronto, MD sap:ap D: 03/11/2012 15:04:41 ET T: 03/11/2012 16:06:14 ET JOB#: [id number]  cc: Elva Breaker A. Posey Pronto, MD, <Dictator> Minus Breeding, MD Richard L. Rosanna Randy, MD Minna Merritts, MD Ilda Basset MD ELECTRONICALLY SIGNED 03/14/2012 13:46

## 2014-12-15 NOTE — Discharge Summary (Signed)
PATIENT NAME:  Marco [name], Marco [name] MR#:  [id number] DATE OF BIRTH:  1935-09-18  DATE OF ADMISSION:  03/11/2012 DATE OF DISCHARGE:  03/13/2012  PRESENTING COMPLAINT: Shortness of breath and cough.   DISCHARGE DIAGNOSES:  1. Acute hypoxic respiratory failure secondary to acute on chronic congestive heart failure.  2. Suspect right lower lobe pneumonia.  3. Cardiomyopathy.  4. History of atrial fibrillation, on Coumadin.  5. Hypertension.  6. Untreated sleep apnea secondary to noncompliance.   CODE STATUS: FULL CODE.   DISCHARGE MEDICATIONS:  1. Calcitrol 0.5 mcg p.o. daily.  2. Carvedilol 6.25 b.i.d.  3. Tamsulosin 0.4 mg, one at bedtime.  4. Crestor 10 mg daily.  5. Amiodarone 200 mg 1/2 tablet daily.  6. Omeprazole 20 mg 3 times a week.  7. Colace 100 mg b.i.d.  8. Aspirin 81 mg daily.  9. Imdur dinitrate 30 mg 1 tablet b.i.d.  10. Pataday 0.2% ophthalmic solution one drop to each affected eye daily as needed.  11. Azelastine 137 mcg nasal spay inhalation two sprays nasally once a day.  12. NovoLog 70/30, 15 to 16 units twice a day.  13. Warfarin 4 mg once a day.  14. Lasix 40 mg in the evening. 15. Furosemide 80 mg once a day in the morning.  16. Keflex 500 mg 1 capsule every 12 hours.  17. Azithromycin 250 mg p.o. daily.  18. 2 liters nasal cannula oxygen.   FOLLOWUP:  1. Follow up with Dr. Rockey Situ on the 22nd of July at 9:00 a.m.  2. Keep your appointment with Dr. Hassell Done, nephrology, [facility].  3. Follow up with Dr. Rosanna Randy in 1 to 2 weeks.   LABORATORY DATA: Glucose 147, BUN 45, creatinine 2.80, sodium 143, potassium 4.8, chloride 101, bicarbonate 38. PT-INR 18.9 and 1.5. Echo showed right ventricular systolic pressure is elevated at 50 to 60, elevated RVSP suggest at least moderate pulmonary hypertension. Left ventricular systolic function is moderate to severely reduced. Ejection fraction at 30 to 35%, moderate to severe global hypokinesis of the left ventricle. Regional wall  motion abnormalities cannot be excluded due to limited visualization. Mitral valve annuloplasty ring is noted. There is mild tricuspid regurgitation. Compared to prior study LV is dilated, ejection fraction has decreased and RVSP has significantly increased. Blood cultures negative in 36 hours. Chest x-ray consistent with chronic obstructive pulmonary disease interstitial infiltrate, likely representing pulmonary edema. Asymmetric edema versus nonedematous infiltrates in the right and left lung bases as well as left effusion. Cardiac enzymes x3 negative.   CONSULTATION: Cardiology consultation with Dr. Rockey Situ.   BRIEF SUMMARY OF HOSPITAL COURSE: Marco [name] is a pleasant 79 year old Caucasian gentleman who comes in with:  1. Acute hypoxic respiratory failure secondary to congestive heart failure, acute systolic, would suspect early right lower lobe pneumonia. The patient was started on IV Lasix around-the-clock. He diuresed well. The patient's Lasix was changed to 80 mg daily and 40 mg in the evening. He has good urine output, felt better clinically. His cardiac enzymes remained negative. We continued Coreg. Echo was reviewed which has shown decrease in his ejection fraction and elevated RVSP from previous echocardiogram which was interpreted by Dr. Rockey Situ. The patient will follow up with Dr. Rockey Situ closely as outpatient in order to manage his Lasix. Sats were 82 to 84% on room air on exertion and home oxygen was set up.  2. Chronic atrial fibrillation, on Coumadin, status post pacemaker. Dosage of Coumadin was adjusted.  3. Suspected early right lower lobe pneumonia. Continue empirically  on Rocephin and Zithromax. The patient's antibiotics were changed to Keflex and Zithromax. Blood cultures were negative. P.r.n. nebulizer treatments were given. 4. Chronic kidney disease stage IV. Creatinine is around 2.6 to 2.9. The patient has an appointment to see Dr. Hassell Done in [place] which he is advised to keep it.   5. Type 2 diabetes. NovoLog 70/30 b.i.d. was continued.  6. Hyperlipidemia. On Crestor.  7. Deep vein thrombosis prophylaxis with heparin.  8. Untreated obstructive sleep apnea. The patient is intolerant to CPAP.   Hospital stay otherwise remained stable. The patient remained a FULL CODE.   TIME SPENT: 40 minutes.    ____________________________ Hart Rochester Posey Pronto, MD sap:ap D: 03/14/2012 07:36:38 ET T: 03/14/2012 14:21:18 ET JOB#: [id number]  cc: Gretna Bergin A. Posey Pronto, MD, <Dictator> Minna Merritts, MD Salem Senate, MD Richard L. Rosanna Randy, MD Ilda Basset MD ELECTRONICALLY SIGNED 03/25/2012 13:14

## 2014-12-19 NOTE — H&P (Signed)
PATIENT NAME:  Marco [name], Marco [name] MR#:  [id number] DATE OF BIRTH:  Dec 27, 1935  DATE OF ADMISSION:  03/13/2014  PRIMARY CARE PHYSICIAN:  Dr. Rosanna Randy.  CHIEF COMPLAINT: Nausea and vomiting.   HISTORY OF PRESENT ILLNESS: This is a 79 year old man who came to the ER yesterday with nausea, vomiting, and abdominal pain, was treated symptomatically and sent home.  Today, had more nausea and vomiting, unable to keep anything down, and came back to the Emergency Room for further evaluation. No complaints of diarrhea. No fever, but feeling weak. No bowel movement since Tuesday or Wednesday. Yesterday, he had more pain in the abdomen. Today, does not have much pain. He was seen in consultation by Dr. Marina Gravel, who said there was no surgical indication for gallstones in the gallbladder, no evidence of cholelithiasis.  Yesterday, he had a CT scan of the abdomen and pelvis that showed no acute bowel abnormality and the patient states that he does not digest vegetables very well and recently had some coleslaw and that is when his symptoms had started.   PAST MEDICAL HISTORY: Atrial fibrillation, congestive heart failure, hypertension, diabetes, chronic kidney disease Stage IV, and sleep apnea.   PAST SURGICAL HISTORY: CABG, pacemaker, triple-A repair, and skin cancer removals.   ALLERGIES: SHELLFISH.   MEDICATIONS: Include amiodarone 200 mg daily, Coreg 6.25 mg at bedtime and 3.125 mg in the a.m., calcitriol 0.5 mcg daily, Colace 100 mg b.i.d., Crestor 20 mg daily, Eliquis 5 mg b.i.d., furosemide 40 mg b.i.d., Imdur 30 mg daily, NovoLog Mix 70/30 fifteen to 16 units twice a day, omeprazole 20 mg t.i.d., Flomax 0.5 mg daily, and hydralazine 12.5 mg b.i.d.   SOCIAL HISTORY: Quit smoking in 1988. Occasional alcohol. No drug use. Worked as a Engineer, structural in [place].   FAMILY HISTORY: Siblings and parents all died of heart disease.   REVIEW OF SYSTEMS:  CONSTITUTIONAL: Positive for weakness. No fever, chills, or  sweats. No weight loss. No weight gain.  EYES: Does have a cataract on the left eye. Does have some visual problems. Wears reading glasses. EARS, NOSE, MOUTH AND THROAT: Positive for runny nose. No sore throat. No difficulty swallowing.  CARDIOVASCULAR: No chest pain. No palpitations.  RESPIRATORY: Occasional shortness of breath. Occasional cough and no sputum. No hemoptysis.  GASTROINTESTINAL: Positive for nausea or vomiting. No hematemesis. No abdominal pain today. No diarrhea. Positive for constipation. No bright red blood per rectum. No melena.  GENITOURINARY: No burning on urination.  No hematuria.  MUSCULOSKELETAL: No joint pain.  INTEGUMENT: No rashes or eruptions.  NEUROLOGIC: No fainting or blackouts.  PSYCHIATRIC: No anxiety or depression.  ENDOCRINE: No thyroid problems.  HEMATOLOGIC AND LYMPHATIC: No anemia. No easy bruising or bleeding.   PHYSICAL EXAMINATION:  VITAL SIGNS: Temperature 98.4, pulse 66, respirations 19, blood pressure 124/62, pulse oximetry 100% on oxygen.  GENERAL: No respiratory distress.  EYES: Conjunctivae and lids normal. Pupils equal, round, and reactive to light. Extraocular  muscles intact. No nystagmus.  EARS, NOSE, MOUTH AND THROAT: Tympanic membranes: No erythema. Nasal mucosa: No erythema. Throat:  No erythema. No exudate seen. Lips and gums: No lesions.  NECK: No JVD. No bruits. No lymphadenopathy. No thyromegaly. No thyroid nodules palpated.  LUNGS: Clear to auscultation. No use of accessory muscles to breathe. No rhonchi, rales, or wheeze heard.  CARDIOVASCULAR: S1, S2, irregularly irregular. No gallops, rubs, or murmurs heard. Carotid upstroke 2+ bilaterally. No bruits.  EXTREMITIES: Dorsalis pedis pulses 1+ bilaterally, 3+ edema of right lower extremity, 2+  edema of left lower extremity.  ABDOMEN: Soft, nontender. No organo-splenomegaly. Normoactive bowel sounds. No masses felt.  LYMPHATIC: No lymph nodes in the neck.  MUSCULOSKELETAL: Three plus  edema right lower extremity,  2+ edema left lower extremity. No clubbing. No cyanosis.  SKIN: No ulcers or lesions. Some chronic lower extremity discoloration, bilateral lower extremity.  PSYCHIATRIC: The patient is oriented to person, place, and time.  NEUROLOGIC: Cranial nerves II-XII grossly intact.   LABORATORY AND RADIOLOGICAL DATA: CT scan of the abdomen and pelvis yesterday was negative. Ultrasound showed gallstones, but no evidence of cholecystitis. Urinalysis 1+ blood, otherwise 30 mg/dL of protein and 50 mg/dL of glucose, otherwise, negative. Glucose 140, BUN 41, creatinine 2.63, sodium 135, potassium 4.3, chloride 97, CO2 33. Liver function tests normal range. White blood cell count 9.7, hemoglobin and hematocrit 13.7 and 42.9, platelet count of 195,000. Troponin negative.   EKG: Paced left bundle branch block.   ASSESSMENT AND PLAN:  1. Nausea and vomiting. We will admit as an observation and monitor overnight.  Will start on clear liquid diet, gentle IV fluids. Hold Lasix at this time. Symptomatic management. We will give MiraLax if he is still constipated by tomorrow.  Pain medication if needed. Nausea medication, if  needed.   2.  Atrial fibrillation. Continue Eliquis, amiodarone, and Coreg.   3.  Coronary artery disease. No symptoms at this point.   4.  Gastroesophageal reflux disease. We will put on IV Protonix.   5.  Benign prostatic hypertrophy on Flomax.   6.  Diabetes. We will put on sliding scale and hold 70/30 insulin at this point.   7.  Hyperlipidemia, hold Crestor today.   8.  Chronic kidney disease, Stage IV. Will hold Lasix and give gentle IV fluids and check a BMP in the a.m.   TIME SPENT ON ADMISSION: 55 minutes.   THE PATIENT IS A FULL CODE.     ____________________________ Tana Conch. Leslye Peer, MD rjw:ts D: 03/13/2014 19:35:20 ET T: 03/13/2014 20:56:03 ET JOB#: [id number]  cc: Tana Conch. Leslye Peer, MD, <Dictator> Terilynn Buresh L. Rosanna Randy, MD Marisue Brooklyn  MD ELECTRONICALLY SIGNED 03/13/2014 21:40

## 2014-12-19 NOTE — Consult Note (Signed)
PATIENT NAME:  Marco [name], Marco [name] MR#:  [id number] DATE OF BIRTH:  06-02-1936  DATE OF CONSULTATION:  03/13/2014  CONSULTING PHYSICIAN:  Elta Guadeloupe A. Marina Gravel, MD  REASON FOR CONSULTATION: Cholelithiasis.   HISTORY: This is a 79 year old morbidly obese white male seen yesterday in the Emergency Room with some left lower quadrant and left upper quadrant abdominal pain, nausea, vomiting, and acute renal failure. Imaging at that time was consistent with a CT scan which demonstrated cholelithiasis. Of note, the patient has a substantial past medical history significant for coronary artery disease, carotid artery disease, recent TIA, history of infrarenal abdominal aortic aneurysm status post open repair.   His pain started Wednesday afternoon in the left lower quadrant associated with nausea and vomiting. It has been intermittent but worsening. Now he is unable to pass any gas. He has not had a bowel movement in several days, had nausea and vomiting for 2 days. Returned to the Emergency Room, at which point, repeat laboratories demonstrate slight worsening of his creatinine and ultrasonography demonstrates gallstones. There is no pericholecystic fluid, biliary ductal dilatation. He states that he thinks that he ate too many raw vegetables. He has had this happen in the past. Surgical services were asked to comment.   ALLERGIES: SHELLFISH.   MEDICATIONS:  1.  Amiodarone. 2.  Calcitriol. 3.  Coreg. 4. Colace. 5.  Crestor. 6.  Eliquis. 7.  Lasix. 8.  Hydralazine. 9.  Isosorbide dinitrate. 10.  Norco. 11.  NovoLog. 12.  Omeprazole. 13.  Flomax.   PAST MEDICAL HISTORY: Coronary artery disease, carotid artery disease, aneurysmal disease, type 1 diabetes, hypertension, obesity, sleep apnea.   PAST SURGICAL HISTORY: Coronary artery bypass grafting, open AAA 2008.   SOCIAL HISTORY: He is married. Does not smoke. Does not drink.   FAMILY HISTORY: Significant for cholelithiasis.   PHYSICAL  EXAMINATION: GENERAL: The patient is on oxygen. No obvious distress. Wife is at bedside.  VITAL SIGNS: Temperature is 97.9, pulse of 60, respiratory rate 18, blood pressure is 152/67, height 6 feet. Weight 265 pounds. BMI is 35.9.  LUNGS: Clear.  HEART: Irregularly irregular.  ABDOMEN: Soft, obese. Large midline scar. No obvious hernia. No tenderness in the right upper quadrant.  EXTREMITIES: Warm and well perfused.  NEUROLOGIC AND PSYCHIATRIC: Unremarkable.   LABORATORY VALUES: Liver function tests are normal. Urinalysis is unremarkable. White count 9.7, hemoglobin 13.7, platelet count 195,000. Liver function tests are normal. Lipase is 50, BUN 41, creatinine 2.63, sodium 135, potassium 4.3, glucose 140, CO2 of 33, chloride 97.   RADIOLOGY DATA:  Personal review of CT scan of the abdomen dated yesterday demonstrates a normal-appearing small and large bowel loops. Diverticula seen in the sigmoid colon. Multiple mobile gallstones, small bilateral pleural effusions, bilateral atelectasis, more prominently seen in the posterior left lower lobe consolidation. Review of ultrasound is as described above dated today.   IMPRESSION: Abdominal pain, nausea, vomiting, dehydration, acute renal failure, cholelithiasis. I do not see any indications for need for acute cholecystectomy. I do not think the patient has acute cholecystitis. He has acute renal failure from dehydration and GI illness unclear etiology. I do not see any signs of obstruction.   RECOMMENDATIONS: Medical admission, hydration, correction of acute renal failure. I will follow along with you.  If there is a question of any gallstone related disease is related to any of this process, HIDA scan will be the next step.   ____________________________ Jeannette How. Marina Gravel, MD mab:ds D: 03/13/2014 18:11:08 ET T: 03/13/2014 19:23:21 ET JOB#:  [id number]  cc: Elta Guadeloupe A. Marina Gravel, MD, <Dictator> Hortencia Conradi MD ELECTRONICALLY SIGNED 03/18/2014 10:57

## 2014-12-19 NOTE — Op Note (Signed)
PATIENT NAME:  Marco [name], Marco [name] MR#:  [id number] DATE OF BIRTH:  26-Aug-1936  DATE OF PROCEDURE:  11/18/2013  PREOPERATIVE DIAGNOSES: 1.  Transient ischemic attack.  2.  Carotid stenosis.  3.  Renal insufficiency.  4.  Atrial fibrillation and cardiomyopathy.   POSTOPERATIVE DIAGNOSES: 1.  Transient ischemic attack.  2.  Carotid stenosis.  3.  Renal insufficiency.  4.  Atrial fibrillation and cardiomyopathy.  PROCEDURE PERFORMED: 1.  Selective injection of the right cervical and cerebral carotid arteries.  2.  Arch aortogram.   PROCEDURE PERFORMED BY:  Katha Cabal, M.D.   SEDATION:  Versed plus fentanyl intravenous.  Continuous ECG, pulse oximetry and cardiopulmonary monitoring was performed throughout the entire procedure by the interventional radiology nurse.  Total sedation time was 40 minutes.   ACCESS:  A 5 French sheath, right common femoral artery.   FLUOROSCOPY TIME:  5.3 minutes.   CONTRAST USED:  Isovue 15 mL.   INDICATIONS:  Marco [name] is a 79 year old gentleman who recently experienced symptoms consistent with a TIA.  He has known carotid disease.  He is status post right carotid endarterectomy and there are elevations of the velocities of the right-sided repair.  Given his renal insufficiency he is not a candidate for CT angiography and he is therefore undergoing selective carotid angiography to minimize his contrast exposure, but to ensure he does not have a lesion which could be accountable for his TIA symptoms.  Risks and benefits were reviewed.  All questions answered.  The patient has agreed to proceed.   DESCRIPTION OF PROCEDURE:  The patient is taken to special procedures and placed in the supine position.  After adequate sedation is achieved, he is positioned supine and his right groin is prepped and draped in a sterile fashion.  Ultrasound is placed in a sterile sleeve.  Ultrasound is utilized secondary to lack of appropriate landmarks and to avoid vascular  injury.  Under direct visualization, common femoral artery is identified.  It is echolucent and pulsatile indicating patency.  Image is recorded for the permanent record.  Under direct visualization the lidocaine is infiltrated and then a micropuncture needle is used to access the common femoral artery, microwire followed by micro sheath, J-wire followed by a 5 French sheath and 5 French pigtail catheter.  The pigtail catheter is positioned in the ascending aorta and an LAO projection of the arch is obtained.  After review of the images two 60 stiff angled Glidewire is used to exchange the pigtail catheter for a JB-1.  The innominate artery is then selected and the wire advanced subsequently into the common carotid on the right.  The wire catheter is then negotiated to the mid common carotid and AP, lateral and oblique views of the cervical carotid are then performed.  The images are then obtained of the intracranial circulation in both a Waters and lateral view.  After review of the image, the catheter and wire are removed.  Oblique view with the right groin is obtained and a StarClose device is deployed.  There were no immediate complications.   INTERPRETATION:  The arch is opacified with a bolus injection of contrast.  There is diffuse disease, but there are no hemodynamically significant lesions or stenoses at the origins of the great vessels, bovine arch anatomy is noted, type II arch is also noted.  Within the innominate as well as the common carotid artery no focal hemodynamically significant stenoses are identified.  Patch angioplasty changes are noted in the area  of the bulb distally at what appears to be the distal endpoint to the patch.  There is mild irregularity with some residual disease or narrowing, but this is less than 50% and does not appear to have any sort of shelf or ulceration.  The mid and distal internal carotid artery as well as the intracranial anatomy does not demonstrate any focal  hemodynamically significant stenoses or severe plaque formation.   SUMMARY:  Patent right carotid endarterectomy site.  Mild to moderate irregularity which does not appear to be adequate to create a source for the transient ischemic attack.  Intracranial images are normal.     ____________________________ Katha Cabal, MD ggs:ea D: 11/19/2013 19:56:35 ET T: 11/20/2013 05:06:05 ET JOB#: [id number]  cc: Katha Cabal, MD, <Dictator> Katha Cabal, MD Richard L. Rosanna Randy, [place] MD ELECTRONICALLY SIGNED 11/21/2013 17:24

## 2014-12-19 NOTE — Discharge Summary (Signed)
PATIENT NAME:  Marco [name], FERO MR#:  [id number] DATE OF BIRTH:  September 14, 1935  DATE OF ADMISSION:  03/13/2014 DATE OF DISCHARGE:  03/16/2014  ADMITTING DIAGNOSIS: Nausea and vomiting.   DISCHARGE DIAGNOSES: 1.  Acute respiratory failure exhibited by saturations in the 70s on room air due to acute on chronic systolic congestive heart failure.  2.  Nausea and vomiting, possibly due to gastroenteritis, now resolved.  3.  Atrial fibrillation.  4.  Coronary artery disease.  5.  Gastroesophageal reflux disease.  6.  Benign prostatic hypertrophy.  7.  Diabetes.  8.  Hyperlipidemia.  9.  Chronic kidney disease stage IV.  10.  Status post CABG.  11.  Status post pacemaker.  12.  Status post abdominal aortic aneurysm repair.  13.  Status post skin cancer removal.  14.  History of sleep apnea, uses oxygen at nighttime, unable to tolerate CPAP.  15.  History of global hypokinesis and reduced right ventricular systolic function, dilated left atrium and right atrium.   PERTINENT LABS AND EVALUATIONS: Admitting glucose was 140, BUN 41, creatinine 2.63, sodium 135, potassium 4.3, chloride 97, CO2 33, calcium 9.9. Lipase 50. LFTs showed albumin of 3. WBC 9.7, hemoglobin 13.7, platelet count 195,000.   Urinalysis: Nitrites negative, leukocytes negative.   Chest x-ray done on July 18th showed stable cardiomegaly, pulmonary vascular congestion, bilateral pleural thickening and bibasilar scarring.   Abdominal ultrasound shows cholelithiasis without evidence of cholecystitis.   HOSPITAL COURSE: Please refer to H and P done by the admitting physician. The patient is a 79 year old white male who initially presented to the Emergency Department complaining of nausea and vomiting. He was seen in the ED and had an ultrasound of the abdomen which showed a gallstone. He was seen in consultation by surgery who felt that his symptoms were not related to the gallstones. There was no evidence of cholecystitis. The patient  was given IV fluids in the ED and they were continued in the hospitalization. He was initially admitted as observation for nausea and vomiting. However, on day 2 his oxygen saturation started dropping to the 70s. He was noted to have edema in his lower extremity and physical examination suggestive of congestive heart failure. He was treated with IV Lasix with significant improvement in his symptoms. The patient does drop oxygenation at home as well. At this time, he will be continued on home oxygen and be re-evaluated by his primary care provider. Discharge instructions for CHF given.   DISCHARGE MEDICATIONS: Calcitriol 0.5 mcg daily, Flomax 0.4 daily, omeprazole 20 one tab p.o. 3 times a week taken on Monday, Wednesday and Friday, Colace 100 one tab p.o. b.i.d., NovoLog 70/30 15 units b.i.d., Eliquis 5 mg 1 tab p.o. b.i.d., amiodarone 200 daily, carvedilol 3.125 one tab p.o. every morning, carvedilol 6.25 one tab p.o. at bedtime, Crestor 20 at bedtime, Lasix 40 mg 1 tab p.o. daily as needed, hydralazine 12.5 one tab p.o. b.i.d., isosorbide dinitrate 15 mg 1 tab p.o. evening, Norco 5/325 one tab q. 6 p.r.n.  HOME OXYGEN: Continuous at 2 liters of nasal cannula.   DISCHARGE DIET: Low sodium, low fat, low cholesterol.   DISCHARGE ACTIVITY: As tolerated.   DISCHARGE FOLLOWUP: Follow up with primary MD in 1 to 2 weeks.   TIME SPENT: 35 minutes.  ____________________________ Lafonda Mosses Posey Pronto, MD shp:sb D: 03/17/2014 08:04:13 ET T: 03/17/2014 09:26:33 ET JOB#: [id number]  cc: Adelin Ventrella H. Posey Pronto, MD, <Dictator> Alric Seton MD ELECTRONICALLY SIGNED 03/18/2014 12:31

## 2014-12-19 NOTE — Consult Note (Signed)
Brief Consult Note: Diagnosis: abdominal pain, acute renal failure, n/v.   Patient was seen by consultant.   Consult note dictated.   Recommend further assessment or treatment.   Discussed with Attending MD.   Comments: cholelithiasis, no clinical signs of acute cholecystitis. no surgical needs at this time.  Electronic Signatures: Sherri Rad (MD)  (Signed 17-Jul-15 18:06)  Authored: Brief Consult Note   Last Updated: 17-Jul-15 18:06 by Sherri Rad (MD)

## 2014-12-20 NOTE — Consult Note (Signed)
PATIENT NAME:  Marco [name], Marco [name] MR#:  [id number] DATE OF BIRTH:  06-06-1936  DATE OF CONSULTATION:  08/24/2011  REFERRING PHYSICIAN:  Gladstone Lighter, MD CONSULTING PHYSICIAN:  Nichoel Digiulio Lilian Kapur, MD  REASON FOR CONSULTATION: Acute renal failure in the setting of known chronic kidney disease, stage IV.   HISTORY OF PRESENT ILLNESS: The patient is a very pleasant 79 year old Caucasian male with past medical history of chronic kidney disease stage IV, history of ischemic cardiomyopathy, congestive heart failure however with improved ejection fraction of 50%, hypertension, diabetes mellitus type 2, hyperlipidemia, atrial fibrillation, coronary artery disease status post coronary artery bypass graft in 2006, history of abdominal aortic aneurysm repair, history of pacemaker placement, and multiple skin cancer resections who presented to [facility] with worsening shortness of breath and fever. He presented to [facility] on 08/20/2011 with these symptoms. His shortness of breath was progressive in nature. He also had fever as high as 103. His wife was sick at home. Ultimately, the patient was diagnosed with influenza during this admission. There was some initial thought that he could potentially have systolic heart failure exacerbation and he was given Lasix. His creatinine now is up to 3.87 with an EGFR of 16. He reports that his baseline eGFR is 25 which corresponds to the GFR upon admission. He has been taken off of Lasix and is currently receiving IV fluid hydration. The patient was not on an ACE inhibitor at home. He also had a renal ultrasound performed which showed a right kidney of 10.9 cm and a left kidney that was 11.1 cm. There was no hydronephrosis noted. There was a masslike density noted in the cortex laterally of the left kidney which may have represented a dromedary hump.  PAST MEDICAL HISTORY:  1. Hypertension.  2. Diabetes mellitus type 2.   3. Hyperlipidemia.  4. Chronic systolic heart failure; however, ejection fraction was 50% on this admission.  5. Chronic kidney disease, stage IV, with baseline EGFR 25, followed by Dr. Salem Senate of [facility].  6. Coronary artery disease status post coronary artery bypass graft in 2006.  7. Abdominal aortic aneurysm repair.  8. Pacemaker placement.  9. Multiple skin cancer resections.   MEDICATIONS: 1. Tylenol 650 mg p.o. every four hours p.r.n.  2. Amiodarone 200 mg p.o. daily.  3. Aspirin 81 mg daily.  4. Azithromycin 500 mg IV every 24 hours.  5. Coreg 12.5 mg p.o. twice a day. 6. Ceftriaxone 1 gram IV every 24 hours.  7. Colace 100 mg p.o. twice a day. 8. Hydralazine 10 mg IV every four hours p.r.n.  9. Insulin 70/30 16 units subcutaneous twice a day.  10. Sliding scale insulin.  11. Isordil 30 mg p.o. twice a day. 12. Nitroglycerin patch 0.2 mg topical daily.  13. Protonix 40 mg p.o. every 6:00 a.m.  14. Crestor 10 mg daily.  15. Flomax 0.4 mg p.o. at bedtime.  16. Tamiflu 75 mg p.o. every 12 hours.  ALLERGIES: Shellfish.   SOCIAL HISTORY: The patient lives in [place]. He is married. He denies tobacco use and quit in 1988. He drinks cocktails once or twice a week.   FAMILY HISTORY: Mother and father both died of coronary artery disease. He also had three brothers who died from coronary artery disease.   REVIEW OF SYSTEMS: CONSTITUTIONAL: The patient did endorse fevers and chills upon admission. Denies weight loss or weight gain. EYES: Denies diplopia, blurry vision, or loss of vision. HEENT: Denies hearing  loss, tinnitus, epistaxis, or sore throat. RESPIRATORY: Does have a cough, wheezing as well as shortness of breath. CARDIOVASCULAR: Currently denies chest pain and palpitations. He does have some lower extremity swelling. GI: Denies nausea, vomiting, diarrhea, or bloody stools. GU: Denies frequency, urgency, or dysuria. He does have history of underlying chronic kidney  disease. ENDOCRINE: Denies polyuria, polydipsia, or polyphagia. HEMATOLOGIC/LYMPHATIC: Denies easy bruisability, bleeding, or swollen lymph nodes. MUSCULOSKELETAL: Denies joint pain, swelling, or redness. INTEGUMENTARY: Has history of skin cancer status post resection. NEUROLOGIC: Denies focal extremity numbness or weakness, but does have some generalized weakness. PSYCHIATRIC: Denies depression and bipolar disorder. ALLERGY/IMMUNOLOGIC: Denies seasonal allergies or history of immunodeficiency.   PHYSICAL EXAMINATION:  GENERAL: This is an obese Caucasian male who appears his stated age, currently in no acute distress.   HEENT: Normocephalic, atraumatic. Extraocular movements are intact. Pupils are equal, round, and reactive to light. No scleral icterus. Conjunctivae are pink. No epistaxis noted. Gross hearing intact. Oral mucosa moist.   NECK: Supple and without obvious jugular venous distention or lymphadenopathy.   LUNGS: Lungs demonstrate bilateral rhonchi with normal respiratory effort.   HEART: S1 and S2 are noted to be irregular, 2/6 systolic ejection murmur heard.   ABDOMEN: Obese, soft, nontender, and nondistended. Bowel sounds positive. No rebound or guarding. No gross organomegaly is appreciated.   EXTREMITIES: Trace bilateral lower extremity edema noted at present. No cyanosis or clubbing.   SKIN: Warm and dry. There is a lesion on his back which is the site of prior skin cancer resection, which appears to be healing well.   MUSCULOSKELETAL: No joint redness, swelling, or tenderness is appreciated.   PSYCHIATRIC: The patient has an appropriate affect and appears to have good insight into his current illness.   LABS/STUDIES: Sodium 140, potassium 4.2, chloride 99, CO2 33, BUN 71, creatinine 3.87, and glucose 118. EGFR 16. Hemoglobin 13.5, hematocrit 41.7, and WBC count 6.8. INR 2.7. Influenza screen positive for influenza A antigen.  Echocardiogram shows an ejection fraction of 50  to 55%, mild concentric left ventricular hypertrophy noted.  Renal ultrasound shows right kidney at 10.9 cm and the left kidney at 11.1 cm. There is fullness in the mid pole of the left kidney measuring 2.5 x 2 x 2.3 cm.   Lower extremity venous duplex was negative for deep vein thrombosis.   Hemoglobin A1c is 8.2. LDL cholesterol is 65.   IMPRESSION/RECOMMENDATIONS: This is a 79 year old Caucasian male with past medical history of hypertension, diabetes mellitus, hyperlipidemia, obesity, coronary artery disease status post coronary artery bypass graft, chronic kidney disease stage IV followed by Dr. Salem Senate with an EGFR of 25, abdominal aortic aneurysm status post repair, pacemaker placement, multiple skin cancer resections, and history of congestive heart failure with most recent ejection fraction of 50% who presented to [facility] with fevers, cough, and shortness of breath.  1. Acute renal failure/chronic kidney disease, stage IV: The patient reports to me that he is regularly followed by Dr. Salem Senate of [facility] in [place]. He reports to me that his eGFR at baseline is 25. This was about his eGFR upon presentation. The patient did receive some Lasix during this hospitalization. He has been taken off of Lasix and is being administered IV fluid hydration. I agree with this approach for now. Follow-up renal function in the a.m. No acute indication for dialysis at this time.  2. Possible left renal mass:. There was fullness noted in the mid pole of the left kidney.  This could represent a dromedary hump; however, it is unclear at present. We will obtain CT scan of the abdomen and pelvis for further evaluation without contrast.  3. Secondary hyperparathyroidism: The patient was maintained on calcitriol as an outpatient. We will check intact PTH, phosphorus, and vitamin D 25 level.  4. Influenza A infection: This is likely in part responsible for his acute renal  failure. I agree with treatment with Tamiflu to treat his underlying influenza infection.  ____________________________ Tama High, MD mnl:slb D: 08/24/2011 16:26:37 ET     T: 08/24/2011 16:51:22 ET        JOB#: [id number] cc: Tama High, MD, <Dictator> Mariah Milling Dangelo Guzzetta MD ELECTRONICALLY SIGNED 09/19/2011 22:11

## 2014-12-20 NOTE — Discharge Summary (Signed)
PATIENT NAME:  Marco [name], Marco [name] MR#:  [id number] DATE OF BIRTH:  [birth year]  DATE OF ADMISSION:  08/20/2011 DATE OF DISCHARGE:  08/28/2011  ADMITTING PHYSICIAN: Dr. Gladstone [name].  PRIMARY CARE PHYSICIAN: Dr. Miguel [name].  PRIMARY CARDIOLOGIST: Dr. Minus [name] from [facility].  PRIMARY NEPHROLOGIST: Dr. Hassell [name], [facility].   CONSULTATIONS WHILE IN THE HOSPITAL:  1. Cardiology consultation by Dr. Ida [name].  2. Pulmonary consultation by Dr. Devona [name].  3. Nephrology consultation by Dr. Anthonette [name].   DISCHARGE DIAGNOSES:  1. Acute respiratory failure.  2. Left lower lobe pneumonia. 3. Persistent small loculated bilateral pleural effusions.  4. Congestive heart failure exacerbation, has both systolic and diastolic dysfunction, ejection fraction of 50%.  5. Influenza A positive.  6. Hypertensive urgency with pulmonary edema on admission.  7. Hypertension.  8. Diabetes mellitus.  9. Coronary artery disease status post bypass graft surgery.  10. Chronic atrial fibrillation, on Coumadin. Status post pacemaker placement.  11. Acute renal failure on top of chronic kidney disease.  12. Chronic kidney disease, stage IV. GFR of 25 at baseline with creatinine of 2.6 at baseline.   DISCHARGE MEDICATIONS:  1. Aspirin 81 mg p.o. daily.  2. Colace 100 mg p.o. b.i.d.  3. Isosorbide dinitrate 30 mg p.o. b.i.d.  4. Crestor 10 mg p.o. at bedtime.  5. Coumadin 2 mg p.o. daily.  6. Coreg 12.5 mg p.o. b.i.d.  7. Amiodarone 200 mg p.o. daily.  8. Calcitrol 0.5 mcg p.o. daily.  9. Tamsulosin 0.4 mg p.o. daily.  10. Omeprazole 20 mg 1 capsule orally daily.  11. NovoLog 70/30 insulin, 15 units subcutaneous b.i.d.  12. Advair 250/50, one puff b.i.d.  13. Albuterol inhaler two puffs every six hours p.r.n.  14. Lasix 40 mg p.o. every other day for one week and then every day afterwards.   DISCHARGE DIET: Low sodium ADA diet.   HOME OXYGEN: 2 liters.   DISCHARGE ACTIVITY: As  tolerated.   FOLLOWUP INSTRUCTIONS:  1. Follow up with PCP in two weeks.  2. Nephrology follow-up in 1 to 2 weeks.  3. Follow-up with pulmonologist in three weeks for chronic pleural effusion.  4. Home health nursing and physical therapy.   LABS AT THE TIME OF DISCHARGE: Sodium 146, potassium 4.7, chloride 107, bicarbonate 33, BUN 54, creatinine 2.94, glucose 121, calcium 8.3. WBC 2.6, hemoglobin 11.6, hematocrit 35.5, platelet count 122. CT of the chest without contrast showing bilateral loculated pleural effusions. They have not changed significantly compared to 08/20/2011. Persistent left lower lobe airspace disease representing atelectasis versus infiltrate is present. Parathyroid hormone within normal limits at 65. Vitamin D level is low at 21.4. Urine eosinophils have been negative. Ultrasound bilateral kidneys showing subtle masslike density in the cortex of the left kidney. It could just be a dromedary hump. Neither kidney have exhibited evidence of obstruction. Follow-up CT of the abdomen and pelvis [name] to look at the mass on the left kidney shows no obstructive or inflammatory abnormalities. No evidence of any renal mass present. Chest x-ray showing bilateral hyperinflation. No definite pleural effusions. Findings suggestive of congestive heart failure are present. His INR at the time of discharge has been 2.6. Troponins have been negative. LDL cholesterol 65, triglycerides 33, total cholesterol 120. HDL has been 38. HbA1c is 8.2. Ultrasound Doppler bilateral lower extremities: No evidence of any deep venous thrombosis. Influenza test positive for influenza A.   BRIEF HOSPITAL COURSE: [name] is a 79 year old Caucasian male with past medical history significant for  coronary artery disease status post bypass graft surgery, prior history of ischemic cardiomyopathy with congestive heart failure, ejection fraction was 25 to 35%, but recent Echo showing improvement to 50%. He presented to the  hospital with low-grade fever, worsening dyspnea on exertion, cough and congestion. He was found to be in congestive heart failure exacerbation and also influenza positive with early bronchitis.  1. Acute respiratory failure secondary to congestive heart failure exacerbation and early bronchitis. He was started on Lasix IV b.i.d. Cardiology has followed the patient. After diuresis, his breathing has improved, but that kind of worsened his kidney function. He was also started on Tamiflu for his influenza positivity. He was treated with Rocephin and azithromycin for his bronchitis. However, repeat chest x-ray showed left lower lobe pneumonia. His antibiotics were continued. His breathing has improved. He started to walk around, but he has been requiring 2 liters of oxygen. He does have very small pleural effusion on the chest x-ray which appeared loculated. CT chest confirmed the same but stable presence. Not sure if it is fibrosis related, but he will follow up with pulmonologist as an outpatient. Because his INR has been high in the hospital, he could not have a thoracenteses [name]. His antibiotics will be continued. Less likely to be empyema as his white count normal and he did not spike any fever. He was on nebulizer treatments for reactive airway disease and wheezing while in the hospital.  2. Chronic atrial fibrillation. He is status post pacemaker placement. He is on amiodarone. His rate is controlled. He is on Coumadin and his INR has been therapeutic all during the hospital course. He was also seen by Dr. Rockey [name] for the same.  3. Acute renal failure on top of CKD stage IV. He follows up with Dr. Hassell [name] in [place]. His baseline creatinine is 2.6. GFR of 25. His creatinine worsened to as high as 3.8 while in the hospital with GFR of 16, probably secondary to Lasix and diuresis. He was given gentle hydration challenge at 50 mL per hour. Creatinine improved to 2.9 with GFR of 22 at the time of discharge. He is  being started on Lasix every other day for now and then he will take every day after a week and will follow up with his nephrologist. His course has been otherwise uneventful in the hospital.   DISCHARGE CONDITION: Stable.   DISCHARGE HOME OXYGEN: 2 liters.   DISCHARGE DISPOSITION: Home with home health.        TIME SPENT ON DISCHARGE: 45 minutes.   ____________________________ Marco Lighter, MD rk:ap D: 08/28/2011 15:38:00 ET T: 08/31/2011 10:49:29 ET JOB#: [id number]  cc: Marco Lighter, MD, <Dictator> Marco Breeding, MD Richard L. Rosanna Randy, MD Salem Senate, MD Marco Lighter MD ELECTRONICALLY SIGNED 09/03/2011 13:37

## 2014-12-20 NOTE — Consult Note (Signed)
PATIENT NAME:  Marco [name], Marco [name] MR#:  [id number] DATE OF BIRTH:  1935-11-21  DATE OF CONSULTATION:  08/21/2011  REFERRING PHYSICIAN:  Sital P. Benjie Karvonen, MD  CONSULTING PHYSICIAN:  Allyne Gee, MD  REASON FOR CONSULTATION: Pneumonia with empyema.   HISTORY OF PRESENT ILLNESS: The patient is a 79 year old gentleman who has multiple medical problems, including coronary artery disease and systolic heart failure, with an ejection fraction of 25% to 35%. The patient came into the hospital because he was having some temperatures which were low-grade. He was having some cough and difficulty lying down and sleeping. When he was seen in the Emergency Room, he  was noted to have a temperature of 100.3, and also of note is that he did have some exposure to sick contacts. Since being here, the patient has been requiring oxygen. He has had a CT scan of his chest done, and the CT showed possible empyema fluid on the left side in the setting of underlying infiltrate and pneumonia.   PAST MEDICAL HISTORY: The patient's past medical history is significant for: 1. Congestive heart failure as mentioned above.  2. Hypertension.  3. Type 2 diabetes.  4. Atrial fibrillation with chronic anticoagulation and chronic kidney disease, stage IV, along with coronary artery disease.   PAST SURGICAL HISTORY:  1. AAA repair.  2. Coronary artery bypass graft.  3. Pacemaker placement.   ALLERGIES: Shellfish.   MEDICATIONS: Medications are reviewed on the electronic medical record and do include aspirin and Coumadin.   FAMILY HISTORY: Positive for coronary artery disease.   SOCIAL HISTORY: Positive for a history of tobacco use but quit about 30 years ago.   REVIEW OF SYSTEMS: CONSTITUTIONAL: He is a little bit lethargic, but what could be elicited generally he is having weakness and fatigue. HEENT: Negative for diplopia. No epistaxis. No ear pain. RESPIRATORY: Positive for shortness of breath and cough. CARDIOVASCULAR:  Negative for any chest pain or palpitations. GU: Negative for any hematuria. ENDOCRINE: Negative for polyuria. SKIN: No acute rashes. PSYCHIATRIC: Negative for any depression. MUSCULOSKELETAL: Negative for arthritis. NEUROLOGICAL: No focal deficits noted.   PHYSICAL EXAMINATION:  GENERAL: On examination, he was resting comfortably.   VITAL SIGNS: His temperature was 99.8, pulse 64, respiratory rate was about 20, blood pressure 124/62. Saturations were 94%.   NECK: Supple. There was no JVD. There no adenopathy.   HEENT: Mouth appeared to be clear.   CHEST: Coarse breath sounds with some rhonchi. No rales. Expansion was equal, diminished at the base.   CARDIOVASCULAR: S1, S2 normal without any gallop or rub.   ABDOMEN: Soft, appeared to be nontender.   EXTREMITIES: Without cyanosis or clubbing.   NEUROLOGICAL: He was somnolent but arousable and moving all four extremities. Gait was not checked.   SKIN: No acute rashes.   IMPRESSION:  1. Pneumonia.  2. Empyema, probable.  3. Acute on chronic respiratory failure.  4. Systolic heart failure.   PLAN: He is currently on azithromycin, has received ceftriaxone. Clinically he seems to be doing a little bit better, but my biggest concern is the findings the CT with possible empyema. I think it needs to be tapped, but the problem here is that he is on anticoagulation and obviously we will need to stop all of that before he can be tapped. His INR was 2.8. I would hold his anticoagulants at this stage, and once his INR drifts down would schedule for a thoracentesis.   Thank you for consulting me in the care  of this patient.  ____________________________ Allyne Gee, MD sak:cbb D: 08/21/2011 12:14:22 ET T: 08/21/2011 13:32:33 ET JOB#: [id number]  cc: Allyne Gee, MD, <Dictator> Allyne Gee MD ELECTRONICALLY SIGNED 09/10/2011 13:29

## 2014-12-21 ENCOUNTER — Encounter: Payer: Self-pay | Admitting: Cardiovascular Disease

## 2014-12-21 ENCOUNTER — Ambulatory Visit (INDEPENDENT_AMBULATORY_CARE_PROVIDER_SITE_OTHER): Payer: Medicare Other | Admitting: Cardiovascular Disease

## 2014-12-21 VITALS — BP 100/68 | HR 81 | Ht 72.0 in | Wt 258.0 lb

## 2014-12-21 DIAGNOSIS — I2510 Atherosclerotic heart disease of native coronary artery without angina pectoris: Secondary | ICD-10-CM | POA: Diagnosis not present

## 2014-12-21 DIAGNOSIS — I255 Ischemic cardiomyopathy: Secondary | ICD-10-CM

## 2014-12-21 DIAGNOSIS — I4891 Unspecified atrial fibrillation: Secondary | ICD-10-CM

## 2014-12-21 DIAGNOSIS — E1122 Type 2 diabetes mellitus with diabetic chronic kidney disease: Secondary | ICD-10-CM

## 2014-12-21 DIAGNOSIS — R319 Hematuria, unspecified: Secondary | ICD-10-CM

## 2014-12-21 DIAGNOSIS — E785 Hyperlipidemia, unspecified: Secondary | ICD-10-CM

## 2014-12-21 DIAGNOSIS — N184 Chronic kidney disease, stage 4 (severe): Secondary | ICD-10-CM

## 2014-12-21 DIAGNOSIS — I4892 Unspecified atrial flutter: Secondary | ICD-10-CM

## 2014-12-21 DIAGNOSIS — I10 Essential (primary) hypertension: Secondary | ICD-10-CM | POA: Diagnosis not present

## 2014-12-21 NOTE — Assessment & Plan Note (Signed)
Currently with no symptoms of angina. No further workup at this time. Continue current medication regimen. 

## 2014-12-21 NOTE — Progress Notes (Signed)
Patient ID: Marco Moll Sr., male    DOB: 1935-09-06, 79 y.o.   MRN: [id number]  HPI Comments: [name] is a very pleasant 78 year old gentleman with a history of coronary artery disease, CABG, atrial fibrillation and DVT, on warfarin, diabetes, previous smoking history PVD, carotid endarterectomy, sick sinus syndrome who  presented to the hospital at [facility] after being seen by Dr. Richardson Landry of ear nose throat with increasing shortness of breath, oxygen saturations to the mid 80s on room air. Cardiology was consulted for systolic and diastolic CHF.  Pacemaker is followed by Dr. [name]  He has LIMA to the LAD, vein graft to the circumflex, vein graft to the PL in June 2006 Also has mitral valve replacement with #28 Edwards logic ring S/p Pacemaker placement AAA repair May 2008 He presents today for follow-up of his coronary artery disease  In follow-up, he reports that he has not been doing as well as before. He's been trying to get in with a urologist for kidney stones and hematuria but was told that they did not take Medicare. His hematorrhea has stopped recently but still has flank pain. Recently seen by Dr. Holley Raring his nephrologist. Recommendation at that time was to see urology. He reports that he has a prostate problem, problems with emptying his bladder. He has nocturia, polyuria.  Blood pressure has been running low, some orthostasis, minimal edema. EKG on today's visit shows normal sinus rhythm with rate 81 bpm, poor R-wave progression through the anterior precordial leads, intraventricular conduction delay, left axis deviation Hemoglobin A1c 6.1  Other past medical history left hand weakness started in December 2014.  carotid angiogram showed patent right carotid endarterectomy site, moderate disease on the left etiology of his symptoms was unclear. carotid disease monitored by Dr. Ronalee Belts.  He does wear compression hose for his chronic lower extremity edema   Echocardiogram in  the hospital 03/12/2012 showed moderate right ventricular pressures 50-60 mmHg consistent with moderate pulmonary hypertension, ejection fraction 30-35%, moderately dilated left atrium, dilated left ventricle.   laboratory creatinine 3.55, BUN 59,  total cholesterol 149, LDL 93, HDL 37   Allergies  Allergen Reactions  . Shellfish Allergy Hives    Outpatient Encounter Prescriptions as of 12/21/2014  Medication Sig  . amiodarone (PACERONE) 200 MG tablet take 1 tablet by mouth once daily  . apixaban (ELIQUIS) 5 MG TABS tablet Take 1 tablet (5 mg total) by mouth 2 (two) times daily.  . calcitRIOL (ROCALTROL) 0.5 MCG capsule Take 0.5 mcg by mouth daily.  . carvedilol (COREG) 3.125 MG tablet Take 3.125 mg by mouth every morning.  . carvedilol (COREG) 6.25 MG tablet Take 1 tablet (6.25 mg total) by mouth daily. (Patient taking differently: Take 6.25 mg by mouth at bedtime. )  . cholecalciferol (VITAMIN D) 1000 UNITS tablet Take 1,000 Units by mouth daily.  [name] docusate sodium (COLACE) 100 MG capsule Take 100 mg by mouth 2 (two) times daily.    . furosemide (LASIX) 40 MG tablet Take 1 tablet (40 mg total) by mouth daily.  . hydrALAZINE (APRESOLINE) 25 MG tablet Take 12.5 mg by mouth 2 (two) times daily.  . isosorbide dinitrate (ISORDIL) 30 MG tablet take 1/2 tablet by mouth every morning and 1 tablet at bedtime  . losartan (COZAAR) 25 MG tablet Take 25 mg by mouth 2 (two) times daily.   . Multiple Vitamins-Minerals (MULTI-VITAMIN GUMMIES) CHEW Chew by mouth daily.  . NON FORMULARY 1.5 liters  . NOVOLOG MIX 70/30 (70-30) 100  UNIT/ML injection Inject 15-16 Units into the skin 2 (two) times daily with a meal. As directed.  [name] omeprazole (PRILOSEC) 20 MG capsule Take 20 mg by mouth every Monday, Wednesday, and Friday. Taking 3 times a week.  . rosuvastatin (CRESTOR) 20 MG tablet Take 1 tablet (20 mg total) by mouth daily.  . Tamsulosin HCl (FLOMAX) 0.4 MG CAPS Take 0.4 mg by mouth at bedtime. 1 po daily   . [DISCONTINUED] CRESTOR 20 MG tablet take 1 tablet by mouth once daily at bedtime (Patient not taking: Reported on 12/21/2014)  . [DISCONTINUED] isosorbide dinitrate (ISORDIL) 30 MG tablet take 1/2 tablet by mouth every morning and 1 tablet at bedtime (Patient not taking: Reported on 12/21/2014)    Past Medical History  Diagnosis Date  . Cardiomyopathy -mixed     EF 30-35% -- 7/13; Pulm HTN and modest RV dysfunction  . Coronary artery disease      The patient had stent to the right coronary artery in 1999.  The patient had unstable angina.  Non-Q-wave myocardial infarction in June 2006.  Coronary artery bypass graft surgery on February 01, 2005, with  mitral valve annuloplasty with   . Sick sinus syndrome   . Mitral regurgitation--s/p repair     placement of a 28-mm Edwards   ETlogix ring.     [name]      Generator replacement 10/13  . Diabetes mellitus   . Hypertension   . Hyperlipidemia   . BPH (benign prostatic hypertrophy)   . Renal insufficiency Gd 4   . GERD (gastroesophageal reflux disease)   . Chronic systolic heart failure     New York Heart Association class II-III   . Obesity   . DVT (deep venous thrombosis)   . Anemia   . Sleep apnea     Not tolerant of CPAP  . Skin cancer 2013    [name] x 2 back and left neck  . AAA (abdominal aortic aneurysm)repaired 2008    repair  . Atrial fibrillation/Flutter     newly identified 2014  . PVC (premature ventricular contraction)     s/p RFCA JAllred 2011  . Carotid artery occlusion     Past Surgical History  Procedure Laterality Date  . Coronary artery bypass graft      CABG with a  LIMA to the LAD, SVG to circumflex, SVG to posterolateral June2006.  . Mitral valve repair  2006     mitral valve repair with a #28 Edwards logic ring  . Pacemaker placement    . Abdominal aortic aneurysm repair       May 2008,   . Cardiac catheterization  2006    @ [facility]  . Insert / replace / remove pacemaker  2006     Dr. [name]  . Skin cancer excision       x 6  . Cataract extraction      right  . Skin biopsy      left wrist; right elbow  . Transesophageal echocardiogram    . Skin cancer excision    . Permanent pacemaker generator change N/A 06/26/2012    Procedure: PERMANENT PACEMAKER GENERATOR CHANGE;  Surgeon: Deboraha Sprang, MD;  Location: [facility] CATH LAB;  Service: Cardiovascular;  Laterality: N/A;    Social History  reports that he quit smoking about 28 years ago. His smoking use included Cigarettes. He has a 51 pack-year smoking history. He has never used smokeless tobacco. He reports that he drinks alcohol.  He reports that he does not use illicit drugs.  Family History family history includes Diabetes in his father and sister; Heart attack in his brother; Heart attack (age of onset: 59) in his father; Heart attack (age of onset: 79) in his mother; Heart disease in his brother, father, and sister.   Review of Systems  Constitutional: Negative.   Respiratory: Negative.   Cardiovascular: Negative.   Gastrointestinal: Negative.   Genitourinary:       Hematuria, flank pain, urgency  Musculoskeletal: Negative.   Neurological: Negative.   Hematological: Negative.   Psychiatric/Behavioral: Negative.   All other systems reviewed and are negative.   BP 100/68 mmHg  Pulse 81  Ht 6' (1.829 m)  Wt 258 lb (117.028 kg)  BMI 34.98 kg/m2  SpO2 94%  Physical Exam  Constitutional: He is oriented to person, place, and time. He appears well-developed and well-nourished.  Obese  HENT:  Head: Normocephalic.  Nose: Nose normal.  Mouth/Throat: Oropharynx is clear and moist.  Eyes: Conjunctivae are normal. Pupils are equal, round, and reactive to light.  Neck: Normal range of motion. Neck supple. No JVD present.  Cardiovascular: Normal rate, regular rhythm, S1 normal, S2 normal, normal heart sounds and intact distal pulses.  Exam reveals no gallop and no friction rub.   No murmur  heard. Pulmonary/Chest: Effort normal and breath sounds normal. No respiratory distress. He has no wheezes. He has no rales. He exhibits no tenderness.  Abdominal: Soft. Bowel sounds are normal. He exhibits no distension. There is no tenderness.  Musculoskeletal: Normal range of motion. He exhibits no edema or tenderness.  Lymphadenopathy:    He has no cervical adenopathy.  Neurological: He is alert and oriented to person, place, and time. Coordination normal.  Skin: Skin is warm and dry. No rash noted. No erythema.  Psychiatric: He has a normal mood and affect. His behavior is normal. Judgment and thought content normal.      Assessment and Plan   Nursing note and vitals reviewed.

## 2014-12-21 NOTE — Assessment & Plan Note (Signed)
Cholesterol is at goal on the current lipid regimen. No changes to the medications were made.  

## 2014-12-21 NOTE — Patient Instructions (Addendum)
You are doing well.  For low blood pressure, Hold the hydralazine  If blood pressure continue to run low, Hold the isosorbide   Please call us if you have new issues that need to be addressed before your next appt.  Your physician wants you to follow-up in: 6 months.  You will receive a reminder letter in the mail two months in advance. If you don't receive a letter, please call our office to schedule the follow-up appointment.  [facility] will contact you with an appt for possible kidney stone

## 2014-12-21 NOTE — Assessment & Plan Note (Signed)
Followed by Dr. Lateef. 

## 2014-12-21 NOTE — Assessment & Plan Note (Signed)
We'll continue aggressive cholesterol management. Appears to relatively euvolemic on today's visit

## 2014-12-21 NOTE — Assessment & Plan Note (Signed)
Normal sinus rhythm on today's visit, first degree AV block. Tolerating anticoagulation

## 2014-12-21 NOTE — Assessment & Plan Note (Signed)
We have recommended that he hold his hydralazine for low blood pressure. If blood pressure continues to run low, would hold the isosorbide dinitrate

## 2014-12-21 NOTE — Assessment & Plan Note (Signed)
Suspect he has a kidney stone. Recent hematuria. Will refer to urology given his continued flank pain

## 2014-12-29 ENCOUNTER — Ambulatory Visit (INDEPENDENT_AMBULATORY_CARE_PROVIDER_SITE_OTHER): Payer: Medicare Other | Admitting: *Deleted

## 2014-12-29 ENCOUNTER — Telehealth: Payer: Self-pay | Admitting: Cardiology

## 2014-12-29 DIAGNOSIS — N189 Chronic kidney disease, unspecified: Secondary | ICD-10-CM | POA: Diagnosis not present

## 2014-12-29 DIAGNOSIS — R350 Frequency of micturition: Secondary | ICD-10-CM | POA: Diagnosis not present

## 2014-12-29 DIAGNOSIS — R310 Gross hematuria: Secondary | ICD-10-CM | POA: Diagnosis not present

## 2014-12-29 DIAGNOSIS — I495 Sick sinus syndrome: Secondary | ICD-10-CM

## 2014-12-29 DIAGNOSIS — Z125 Encounter for screening for malignant neoplasm of prostate: Secondary | ICD-10-CM | POA: Diagnosis not present

## 2014-12-29 NOTE — Telephone Encounter (Signed)
LMOVM reminding pt to send remote transmission.   

## 2014-12-30 ENCOUNTER — Encounter: Payer: Self-pay | Admitting: Cardiology

## 2014-12-30 ENCOUNTER — Telehealth: Payer: Self-pay | Admitting: Internal Medicine

## 2014-12-30 DIAGNOSIS — I495 Sick sinus syndrome: Secondary | ICD-10-CM

## 2014-12-30 NOTE — Telephone Encounter (Signed)
Transmission received.

## 2014-12-30 NOTE — Progress Notes (Signed)
Remote pacemaker transmission.   

## 2014-12-30 NOTE — Telephone Encounter (Signed)
°  1. Has your device fired?   2. Is you device beeping?   3. Are you experiencing draining or swelling at device site?   4. Are you calling to see if we received your device transmission?   5. Have you passed out?  Comments: Pt wife called states that they had previous appts yesterday and was unable to send signal. Will send signals today.

## 2015-01-02 LAB — CUP PACEART REMOTE DEVICE CHECK
Battery Voltage: 2.8 V
Brady Statistic AP VS Percent: 85 %
Brady Statistic AS VS Percent: 3 %
Date Time Interrogation Session: 20160504162229
Lead Channel Impedance Value: 424 Ohm
Lead Channel Pacing Threshold Amplitude: 0.5 V
Lead Channel Pacing Threshold Amplitude: 1 V
Lead Channel Pacing Threshold Pulse Width: 0.4 ms
Lead Channel Pacing Threshold Pulse Width: 0.4 ms
Lead Channel Setting Pacing Pulse Width: 0.4 ms
Lead Channel Setting Sensing Sensitivity: 5.6 mV
MDC IDC MSMT BATTERY IMPEDANCE: 157 Ohm
MDC IDC MSMT BATTERY REMAINING LONGEVITY: 119 mo
MDC IDC MSMT LEADCHNL RA IMPEDANCE VALUE: 440 Ohm
MDC IDC MSMT LEADCHNL RV SENSING INTR AMPL: 16 mV
MDC IDC SET LEADCHNL RA PACING AMPLITUDE: 2 V
MDC IDC SET LEADCHNL RV PACING AMPLITUDE: 2.5 V
MDC IDC STAT BRADY AP VP PERCENT: 12 %
MDC IDC STAT BRADY AS VP PERCENT: 0 %

## 2015-01-05 DIAGNOSIS — M5136 Other intervertebral disc degeneration, lumbar region: Secondary | ICD-10-CM | POA: Diagnosis not present

## 2015-01-05 DIAGNOSIS — M5033 Other cervical disc degeneration, cervicothoracic region: Secondary | ICD-10-CM | POA: Diagnosis not present

## 2015-01-05 DIAGNOSIS — M9903 Segmental and somatic dysfunction of lumbar region: Secondary | ICD-10-CM | POA: Diagnosis not present

## 2015-01-05 DIAGNOSIS — M9901 Segmental and somatic dysfunction of cervical region: Secondary | ICD-10-CM | POA: Diagnosis not present

## 2015-01-06 ENCOUNTER — Other Ambulatory Visit: Payer: Self-pay | Admitting: *Deleted

## 2015-01-06 MED ORDER — AMIODARONE HCL 200 MG PO TABS: 200.0000 mg | ORAL_TABLET | Freq: Every day | ORAL | Status: DC

## 2015-01-11 DIAGNOSIS — I509 Heart failure, unspecified: Secondary | ICD-10-CM | POA: Diagnosis not present

## 2015-01-12 DIAGNOSIS — K8070 Calculus of gallbladder and bile duct without cholecystitis without obstruction: Secondary | ICD-10-CM | POA: Diagnosis not present

## 2015-01-12 DIAGNOSIS — R310 Gross hematuria: Secondary | ICD-10-CM | POA: Diagnosis not present

## 2015-01-12 DIAGNOSIS — N202 Calculus of kidney with calculus of ureter: Secondary | ICD-10-CM | POA: Diagnosis not present

## 2015-01-12 DIAGNOSIS — I714 Abdominal aortic aneurysm, without rupture: Secondary | ICD-10-CM | POA: Diagnosis not present

## 2015-01-13 DIAGNOSIS — I6529 Occlusion and stenosis of unspecified carotid artery: Secondary | ICD-10-CM | POA: Diagnosis not present

## 2015-01-13 DIAGNOSIS — E785 Hyperlipidemia, unspecified: Secondary | ICD-10-CM | POA: Diagnosis not present

## 2015-01-13 DIAGNOSIS — I10 Essential (primary) hypertension: Secondary | ICD-10-CM | POA: Diagnosis not present

## 2015-01-13 DIAGNOSIS — E119 Type 2 diabetes mellitus without complications: Secondary | ICD-10-CM | POA: Diagnosis not present

## 2015-01-18 ENCOUNTER — Encounter: Payer: Self-pay | Admitting: Cardiology

## 2015-01-21 DIAGNOSIS — R310 Gross hematuria: Secondary | ICD-10-CM | POA: Diagnosis not present

## 2015-01-26 ENCOUNTER — Encounter: Payer: Self-pay | Admitting: Internal Medicine

## 2015-01-26 ENCOUNTER — Other Ambulatory Visit: Payer: Self-pay

## 2015-01-26 DIAGNOSIS — R350 Frequency of micturition: Secondary | ICD-10-CM | POA: Diagnosis not present

## 2015-01-26 DIAGNOSIS — R310 Gross hematuria: Secondary | ICD-10-CM | POA: Diagnosis not present

## 2015-01-26 DIAGNOSIS — N189 Chronic kidney disease, unspecified: Secondary | ICD-10-CM | POA: Diagnosis not present

## 2015-01-26 DIAGNOSIS — I4891 Unspecified atrial fibrillation: Secondary | ICD-10-CM

## 2015-01-26 DIAGNOSIS — I4892 Unspecified atrial flutter: Secondary | ICD-10-CM

## 2015-01-26 MED ORDER — APIXABAN 5 MG PO TABS: 5.0000 mg | ORAL_TABLET | Freq: Two times a day (BID) | ORAL | Status: DC

## 2015-01-26 NOTE — Telephone Encounter (Signed)
Refill sent for eliquis  

## 2015-01-28 ENCOUNTER — Telehealth: Payer: Self-pay | Admitting: *Deleted

## 2015-01-28 DIAGNOSIS — G4730 Sleep apnea, unspecified: Secondary | ICD-10-CM | POA: Insufficient documentation

## 2015-01-28 DIAGNOSIS — K219 Gastro-esophageal reflux disease without esophagitis: Secondary | ICD-10-CM | POA: Insufficient documentation

## 2015-01-28 DIAGNOSIS — K589 Irritable bowel syndrome without diarrhea: Secondary | ICD-10-CM | POA: Insufficient documentation

## 2015-01-28 DIAGNOSIS — K2270 Barrett's esophagus without dysplasia: Secondary | ICD-10-CM | POA: Insufficient documentation

## 2015-01-28 DIAGNOSIS — E119 Type 2 diabetes mellitus without complications: Secondary | ICD-10-CM | POA: Insufficient documentation

## 2015-01-28 DIAGNOSIS — J90 Pleural effusion, not elsewhere classified: Secondary | ICD-10-CM | POA: Insufficient documentation

## 2015-01-28 DIAGNOSIS — N400 Enlarged prostate without lower urinary tract symptoms: Secondary | ICD-10-CM | POA: Insufficient documentation

## 2015-01-28 DIAGNOSIS — I2510 Atherosclerotic heart disease of native coronary artery without angina pectoris: Secondary | ICD-10-CM | POA: Insufficient documentation

## 2015-01-28 DIAGNOSIS — D649 Anemia, unspecified: Secondary | ICD-10-CM | POA: Insufficient documentation

## 2015-01-28 DIAGNOSIS — C44310 Basal cell carcinoma of skin of unspecified parts of face: Secondary | ICD-10-CM | POA: Insufficient documentation

## 2015-01-28 NOTE — Telephone Encounter (Signed)
Patient called office stating that he has rash/blisters on his face. Patient stated that blisters are painful, burning. Per Dr. Rosanna Randy advised pt to schedule ov. Patient scheduled appt 01/29/2015 at 8:30am.

## 2015-01-29 ENCOUNTER — Encounter: Payer: Self-pay | Admitting: Family Medicine

## 2015-01-29 ENCOUNTER — Ambulatory Visit (INDEPENDENT_AMBULATORY_CARE_PROVIDER_SITE_OTHER): Payer: Medicare Other | Admitting: Family Medicine

## 2015-01-29 ENCOUNTER — Ambulatory Visit: Payer: Self-pay | Admitting: Family Medicine

## 2015-01-29 VITALS — BP 148/76 | HR 76 | Temp 98.8°F | Resp 14 | Ht 72.0 in | Wt 250.0 lb

## 2015-01-29 DIAGNOSIS — R21 Rash and other nonspecific skin eruption: Secondary | ICD-10-CM | POA: Diagnosis not present

## 2015-01-29 DIAGNOSIS — B029 Zoster without complications: Secondary | ICD-10-CM

## 2015-01-29 MED ORDER — VALACYCLOVIR HCL 1 G PO TABS: 1000.0000 mg | ORAL_TABLET | Freq: Three times a day (TID) | ORAL | Status: DC

## 2015-01-29 MED ORDER — HYDROCODONE-ACETAMINOPHEN 10-325 MG PO TABS: 1.0000 | ORAL_TABLET | ORAL | Status: DC | PRN

## 2015-01-29 MED ORDER — GABAPENTIN 100 MG PO CAPS: 100.0000 mg | ORAL_CAPSULE | Freq: Three times a day (TID) | ORAL | Status: DC

## 2015-01-29 NOTE — Patient Instructions (Signed)
Start Gabapentin on 01/31/15 with 1 at bedtime for 2 nights.  Then take 1 pill twice daily for 2 days.  Then take 1 three times daily

## 2015-01-29 NOTE — Progress Notes (Signed)
   Subjective:    Patient ID: Marco Moll Sr., male    DOB: Jan 04, 1936, 79 y.o.   MRN: [id number]  Rash This is a new problem. Episode onset: pain and itching 3 days ago, blisters 2 days ago. The problem has been gradually worsening since onset. The affected locations include the scalp, head, face and neck. The rash is characterized by blistering, burning, pain, redness, itchiness and swelling. Pertinent negatives include no anorexia, congestion, cough, diarrhea, eye pain, facial edema, fatigue, fever, joint pain, nail changes, rhinorrhea, shortness of breath, sore throat or vomiting.         Review of Systems  Constitutional: Negative for fever and fatigue.  HENT: Negative for congestion, rhinorrhea and sore throat.   Eyes: Negative for pain.  Respiratory: Negative for cough, chest tightness, shortness of breath and wheezing.   Cardiovascular: Negative for chest pain and palpitations.  Gastrointestinal: Negative for vomiting, diarrhea and anorexia.  Musculoskeletal: Negative for joint pain.  Skin: Positive for rash. Negative for nail changes.       Objective:   Physical Exam  Constitutional: He is oriented to person, place, and time. He appears well-developed and well-nourished.  HENT:  Head: Normocephalic and atraumatic.  Eyes: Conjunctivae and EOM are normal. Pupils are equal, round, and reactive to light.  Neck: Normal range of motion. Neck supple.  Cardiovascular: Normal rate, regular rhythm and normal heart sounds.   Pulmonary/Chest: Effort normal and breath sounds normal.  Neurological: He is alert and oriented to person, place, and time. He has normal reflexes.  Skin: Skin is warm and dry. Rash noted. Rash is pustular. There is erythema.     Psychiatric: He has a normal mood and affect. His behavior is normal. Judgment and thought content normal.          Assessment & Plan:  1. Rash Shingles in right C3 nerve area.  Recommend topical cream and will treat with  Valtrex and Hydrocodone.  Will also give Gabapentin to start in 2-3 days. Will see patient back in 2-3 weeks for pain reassessment. Discussed the normal  course of events for postherpetic neuralgia. We'll try to avoid chronic narcotics if at all possible.

## 2015-02-02 DIAGNOSIS — I10 Essential (primary) hypertension: Secondary | ICD-10-CM | POA: Diagnosis not present

## 2015-02-02 DIAGNOSIS — N2581 Secondary hyperparathyroidism of renal origin: Secondary | ICD-10-CM | POA: Diagnosis not present

## 2015-02-02 DIAGNOSIS — R600 Localized edema: Secondary | ICD-10-CM | POA: Diagnosis not present

## 2015-02-02 DIAGNOSIS — E1122 Type 2 diabetes mellitus with diabetic chronic kidney disease: Secondary | ICD-10-CM | POA: Diagnosis not present

## 2015-02-02 DIAGNOSIS — N184 Chronic kidney disease, stage 4 (severe): Secondary | ICD-10-CM | POA: Diagnosis not present

## 2015-02-03 ENCOUNTER — Encounter: Payer: Self-pay | Admitting: Cardiology

## 2015-02-05 ENCOUNTER — Encounter: Payer: Self-pay | Admitting: Family Medicine

## 2015-02-05 DIAGNOSIS — M9901 Segmental and somatic dysfunction of cervical region: Secondary | ICD-10-CM | POA: Diagnosis not present

## 2015-02-05 DIAGNOSIS — M9903 Segmental and somatic dysfunction of lumbar region: Secondary | ICD-10-CM | POA: Diagnosis not present

## 2015-02-05 DIAGNOSIS — M5033 Other cervical disc degeneration, cervicothoracic region: Secondary | ICD-10-CM | POA: Diagnosis not present

## 2015-02-05 DIAGNOSIS — M5136 Other intervertebral disc degeneration, lumbar region: Secondary | ICD-10-CM | POA: Diagnosis not present

## 2015-02-08 DIAGNOSIS — M9901 Segmental and somatic dysfunction of cervical region: Secondary | ICD-10-CM | POA: Diagnosis not present

## 2015-02-08 DIAGNOSIS — M5033 Other cervical disc degeneration, cervicothoracic region: Secondary | ICD-10-CM | POA: Diagnosis not present

## 2015-02-08 DIAGNOSIS — M9903 Segmental and somatic dysfunction of lumbar region: Secondary | ICD-10-CM | POA: Diagnosis not present

## 2015-02-08 DIAGNOSIS — M5136 Other intervertebral disc degeneration, lumbar region: Secondary | ICD-10-CM | POA: Diagnosis not present

## 2015-02-11 DIAGNOSIS — I509 Heart failure, unspecified: Secondary | ICD-10-CM | POA: Diagnosis not present

## 2015-02-21 ENCOUNTER — Other Ambulatory Visit: Payer: Self-pay | Admitting: Family Medicine

## 2015-03-09 DIAGNOSIS — M9903 Segmental and somatic dysfunction of lumbar region: Secondary | ICD-10-CM | POA: Diagnosis not present

## 2015-03-09 DIAGNOSIS — M5136 Other intervertebral disc degeneration, lumbar region: Secondary | ICD-10-CM | POA: Diagnosis not present

## 2015-03-09 DIAGNOSIS — M5033 Other cervical disc degeneration, cervicothoracic region: Secondary | ICD-10-CM | POA: Diagnosis not present

## 2015-03-09 DIAGNOSIS — M9901 Segmental and somatic dysfunction of cervical region: Secondary | ICD-10-CM | POA: Diagnosis not present

## 2015-03-13 DIAGNOSIS — I509 Heart failure, unspecified: Secondary | ICD-10-CM | POA: Diagnosis not present

## 2015-03-31 ENCOUNTER — Ambulatory Visit (INDEPENDENT_AMBULATORY_CARE_PROVIDER_SITE_OTHER): Payer: Medicare Other | Admitting: *Deleted

## 2015-03-31 DIAGNOSIS — I495 Sick sinus syndrome: Secondary | ICD-10-CM | POA: Diagnosis not present

## 2015-03-31 NOTE — Progress Notes (Signed)
Remote pacemaker transmission.   

## 2015-04-05 ENCOUNTER — Ambulatory Visit (INDEPENDENT_AMBULATORY_CARE_PROVIDER_SITE_OTHER): Payer: Medicare Other | Admitting: Family Medicine

## 2015-04-05 ENCOUNTER — Encounter: Payer: Self-pay | Admitting: Family Medicine

## 2015-04-05 VITALS — BP 116/60 | HR 72 | Temp 98.2°F | Resp 16 | Wt 266.0 lb

## 2015-04-05 DIAGNOSIS — I10 Essential (primary) hypertension: Secondary | ICD-10-CM | POA: Diagnosis not present

## 2015-04-05 DIAGNOSIS — E785 Hyperlipidemia, unspecified: Secondary | ICD-10-CM | POA: Diagnosis not present

## 2015-04-05 DIAGNOSIS — E1121 Type 2 diabetes mellitus with diabetic nephropathy: Secondary | ICD-10-CM

## 2015-04-05 DIAGNOSIS — I25119 Atherosclerotic heart disease of native coronary artery with unspecified angina pectoris: Secondary | ICD-10-CM

## 2015-04-05 LAB — CUP PACEART REMOTE DEVICE CHECK
Battery Impedance: 180 Ohm
Battery Remaining Longevity: 114 mo
Battery Voltage: 2.79 V
Brady Statistic AP VP Percent: 10 %
Brady Statistic AS VP Percent: 1 %
Brady Statistic AS VS Percent: 2 %
Date Time Interrogation Session: 20160803135426
Lead Channel Impedance Value: 435 Ohm
Lead Channel Pacing Threshold Amplitude: 1.125 V
Lead Channel Pacing Threshold Pulse Width: 0.4 ms
Lead Channel Pacing Threshold Pulse Width: 0.4 ms
Lead Channel Setting Pacing Amplitude: 2 V
Lead Channel Setting Pacing Amplitude: 2.5 V
Lead Channel Setting Sensing Sensitivity: 5.6 mV
MDC IDC MSMT LEADCHNL RA PACING THRESHOLD AMPLITUDE: 0.625 V
MDC IDC MSMT LEADCHNL RV IMPEDANCE VALUE: 440 Ohm
MDC IDC MSMT LEADCHNL RV SENSING INTR AMPL: 11.2 mV
MDC IDC SET LEADCHNL RV PACING PULSEWIDTH: 0.4 ms
MDC IDC STAT BRADY AP VS PERCENT: 88 %

## 2015-04-05 NOTE — Progress Notes (Signed)
Patient ID: Marco Gillson Sr., male   DOB: 04-06-1936, 79 y.o.   MRN: [id number]    Subjective:  HPI  Diabetes Mellitus Type II, Follow-up:   Lab Results  Component Value Date   HGBA1C 6.1* 11/24/2014   HGBA1C 6.9 11/04/2008    Last seen for diabetes 4 months ago.  Management changes included none. He reports good compliance with treatment. He is not having side effects.  Current symptoms include none  Home blood sugar records: 90's-130's  Episodes of hypoglycemia? no   Current Insulin Regimen: 15-16 Units Most Recent Eye Exam: within a year Current exercise: none  Pertinent Labs:    Component Value Date/Time   CHOL 140 07/02/2014   CHOL 149 09/23/2013 0841   TRIG 105 07/02/2014   TRIG 94 11/04/2008 0000   CHOLHDL 3.5 09/23/2013 0841   CREATININE 2.3* 07/02/2014   CREATININE 2.68* 03/15/2014 0514   CREATININE 2.72* 06/20/2013 1026    Wt Readings from Last 3 Encounters:  04/05/15 266 lb (120.657 kg)  01/29/15 250 lb (113.399 kg)  11/24/14 258 lb (117.028 kg)    ------------------------------------------------------------------------   Hypertension, follow-up:  BP Readings from Last 3 Encounters:  04/05/15 116/60  01/29/15 148/76  11/24/14 138/82    He was last seen for hypertension 4 months ago.  BP at that visit was 148/76. Management changes since that visit include none. He reports good compliance with treatment. He is not having side effects.  He is not exercising. Outside blood pressures are 120-130/60-70's. He is experiencing dyspnea. ( Not new for patient.) Patient denies none.      Weight trend: stable Wt Readings from Last 3 Encounters:  04/05/15 266 lb (120.657 kg)  01/29/15 250 lb (113.399 kg)  11/24/14 258 lb (117.028 kg)    ------------------------------------------------------------------------      Prior to Admission medications   Medication Sig Start Date End Date Taking? Authorizing Provider  amiodarone  (PACERONE) 200 MG tablet Take 1 tablet (200 mg total) by mouth daily. 01/06/15  Yes Minna Merritts, MD  apixaban (ELIQUIS) 5 MG TABS tablet Take by mouth.   Yes Historical Provider, MD  BD INSULIN SYRINGE ULTRAFINE 31G X 5/16" 0.3 ML MISC use twice a day 02/22/15  Yes Reality Dejonge Maceo Pro., MD  calcitRIOL (ROCALTROL) 0.5 MCG capsule Take 0.5 mcg by mouth daily.    Historical Provider, MD  carvedilol (COREG) 6.25 MG tablet Take 1 tablet (6.25 mg total) by mouth daily. Patient taking differently: Take 6.25 mg by mouth at bedtime.  10/05/14  Yes Minna Merritts, MD  cholecalciferol (VITAMIN D) 1000 UNITS tablet Take 1,000 Units by mouth daily.   Yes Historical Provider, MD  docusate sodium (COLACE) 100 MG capsule Take 100 mg by mouth 2 (two) times daily.     Yes Historical Provider, MD  furosemide (LASIX) 40 MG tablet Take 1 tablet (40 mg total) by mouth daily. 06/13/13  Yes Darlin Coco, MD  gabapentin (NEURONTIN) 100 MG capsule Take 1 capsule (100 mg total) by mouth 3 (three) times daily. 01/29/15  Yes Livana Yerian Maceo Pro., MD  glucose blood test strip ACCU-CHEK SMARTVIEW (In Vitro Strip)  1 Strip Strip two times daily for 0 days  Quantity: 100;  Refills: 12   Ordered :18-February-2014  Miguel Aschoff MD;  Started 18-February-2014 Active Comments: Dx 250.00 02/18/14  Yes Historical Provider, MD  hydrALAZINE (APRESOLINE) 25 MG tablet Take 12.5 mg by mouth 2 (two) times daily. 06/23/13  Yes Minna Merritts, MD  HYDROcodone-acetaminophen (NORCO) 10-325 MG per tablet Take 1-2 tablets by mouth every 4 (four) hours as needed. 01/29/15  Yes Aliciana Ricciardi Maceo Pro., MD  insulin lispro protamine-lispro (HUMALOG 75/25 MIX) (75-25) 100 UNIT/ML SUSP injection Inject into the skin. 11/24/14  Yes Historical Provider, MD  isosorbide dinitrate (ISORDIL) 30 MG tablet take 1/2 tablet by mouth every morning and 1 tablet at bedtime 06/22/14  Yes Minna Merritts, MD  losartan (COZAAR) 25 MG tablet Take 25 mg by mouth 2 (two) times  daily.  09/14/14  Yes Historical Provider, MD  Multiple Vitamins-Minerals (MULTI-VITAMIN GUMMIES) CHEW Chew by mouth daily.   Yes Historical Provider, MD  NON FORMULARY 1.5 liters   Yes Historical Provider, MD  omeprazole (PRILOSEC) 20 MG capsule Take 20 mg by mouth every Monday, Wednesday, and Friday. Taking 3 times a week.   Yes Historical Provider, MD  rosuvastatin (CRESTOR) 20 MG tablet Take 1 tablet (20 mg total) by mouth daily. 10/01/13  Yes Minna Merritts, MD  Tamsulosin HCl (FLOMAX) 0.4 MG CAPS Take 0.4 mg by mouth at bedtime. 1 po daily   Yes Historical Provider, MD    Patient Active Problem List   Diagnosis Date Noted  . Absolute anemia 01/28/2015  . Atherosclerosis of coronary artery 01/28/2015  . Barrett's esophagus 01/28/2015  . Basal cell carcinoma of face 01/28/2015  . Benign fibroma of prostate 01/28/2015  . CCF (congestive cardiac failure) 01/28/2015  . Diabetes 01/28/2015  . Gastro-esophageal reflux disease without esophagitis 01/28/2015  . Irritable colon 01/28/2015  . Cannot sleep 01/28/2015  . Pleural cavity effusion 01/28/2015  . Apnea, sleep 01/28/2015  . Hematuria 12/21/2014  . Left hand weakness 09/23/2013  . Essential hypertension 06/23/2013  . Chronic systolic heart failure 27/01/2375  . Hypotension, postural 06/10/2013  . Hyperkalemia 06/10/2013  . Adjustment of cardiac pacemaker 06/10/2013  . Atrial fibrillation and flutter 10/22/2012  . Pacemaker-Medtronic 06/19/2012  . Pulmonary hypertension 03/18/2012  . Occlusion and stenosis of carotid artery without mention of cerebral infarction 02/08/2012  . Morbid obesity 08/05/2010  . Sinoatrial node dysfunction 06/02/2010  . Stage 4 chronic renal impairment associated with type 2 diabetes mellitus 08/09/2009  . AAA 04/21/2009  . Ischemic cardiomyopathy  s/p CABG 11/23/2008  . MITRAL REGURGITATION 11/22/2008  . CAD (coronary artery disease) 11/22/2008  . Hyperlipidemia 03/02/2008  . ALLERGIC RHINITIS  03/02/2008  . OSA (obstructive sleep apnea) 03/02/2008    Past Medical History  Diagnosis Date  . Cardiomyopathy -mixed     EF 30-35% -- 7/13; Pulm HTN and modest RV dysfunction  . Coronary artery disease      The patient had stent to the right coronary artery in 1999.  The patient had unstable angina.  Non-Q-wave myocardial infarction in June 2006.  Coronary artery bypass graft surgery on February 01, 2005, with  mitral valve annuloplasty with   . Sick sinus syndrome   . Mitral regurgitation--s/p repair     placement of a 28-mm Edwards   ETlogix ring.     [name]      Generator replacement 10/13  . Diabetes mellitus   . Hypertension   . Hyperlipidemia   . BPH (benign prostatic hypertrophy)   . Renal insufficiency Gd 4   . GERD (gastroesophageal reflux disease)   . Chronic systolic heart failure     New York Heart Association class II-III   . Obesity   . DVT (deep venous thrombosis)   . Anemia   . Sleep apnea  Not tolerant of CPAP  . Skin cancer 2013    [name] x 2 back and left neck  . AAA (abdominal aortic aneurysm)repaired 2008    repair  . Atrial fibrillation/Flutter     newly identified 2014  . PVC (premature ventricular contraction)     s/p RFCA JAllred 2011  . Carotid artery occlusion     History   Social History  . Marital Status: Married    Spouse Name: N/A  . Number of Children: N/A  . Years of Education: N/A   Occupational History  . Retired     Engineer, structural in [place]  .     Social History Main Topics  . Smoking status: Former Smoker -- 1.50 packs/day for 34 years    Types: Cigarettes    Quit date: 08/28/1987  . Smokeless tobacco: Never Used  . Alcohol Use: 0.6 oz/week    1 Cans of beer per week     Comment: Occasionally but none since his surgery in June  . Drug Use: No  . Sexual Activity: Not on file   Other Topics Concern  . Not on file   Social History Narrative    Allergies  Allergen Reactions  .  Shellfish Allergy Hives    Review of Systems  Constitutional: Negative.   HENT: Negative.   Eyes: Negative.   Respiratory: Positive for shortness of breath.   Cardiovascular: Negative.   Gastrointestinal: Negative.   Genitourinary: Negative.   Musculoskeletal: Negative.   Skin: Negative.   Neurological: Negative.   Endo/Heme/Allergies: Negative.   Psychiatric/Behavioral: Negative.     Immunization History  Administered Date(s) Administered  . Influenza Split 06/28/2012  . Influenza-Unspecified 05/28/2013  . Pneumococcal Conjugate-13 06/28/2012  . Pneumococcal Polysaccharide-23 11/24/2014   Objective:  BP 116/60 mmHg  Pulse 72  Temp(Src) 98.2 F (36.8 C) (Oral)  Resp 16  Wt 266 lb (120.657 kg)  Physical Exam  Constitutional: He is oriented to person, place, and time and well-developed, well-nourished, and in no distress.  HENT:  Head: Normocephalic and atraumatic.  Right Ear: External ear normal.  Left Ear: External ear normal.  Nose: Nose normal.  Eyes: Conjunctivae and EOM are normal. Pupils are equal, round, and reactive to light.  Neck: Normal range of motion. Neck supple.  Cardiovascular: Normal rate, regular rhythm, normal heart sounds and intact distal pulses.   Pulmonary/Chest: Effort normal and breath sounds normal.  Abdominal: Soft. Bowel sounds are normal.  Musculoskeletal: He exhibits edema (1+ with support hose).  Neurological: He is alert and oriented to person, place, and time. He has normal reflexes. Gait normal. GCS score is 15.  Skin: Skin is warm and dry.  Psychiatric: Mood, memory, affect and judgment normal.    Lab Results  Component Value Date   WBC 7.4 03/15/2014   HGB 12.4* 03/15/2014   HCT 39.1* 03/15/2014   PLT 163 03/15/2014   GLUCOSE 130* 03/15/2014   CHOL 140 07/02/2014   TRIG 105 07/02/2014   HDL 45 07/02/2014   LDLCALC 74 07/02/2014   TSH 1.920 10/01/2014   PSA 0.5 10/01/2014   INR 1.1 11/14/2012   HGBA1C 6.1* 11/24/2014      CMP     Component Value Date/Time   NA 143 07/02/2014   NA 138 03/15/2014 0514   NA 139 06/02/2010 0931   K 4.7 07/02/2014   K 3.9 03/15/2014 0514   CL 99 03/15/2014 0514   CL 99 06/20/2013 1026   CO2 36* 03/15/2014  0514   CO2 28 06/20/2013 1026   GLUCOSE 130* 03/15/2014 0514   GLUCOSE 76 06/20/2013 1026   GLUCOSE 144* 06/02/2010 0931   BUN 33* 07/02/2014   BUN 42* 03/15/2014 0514   BUN 36* 06/02/2010 0931   CREATININE 2.3* 07/02/2014   CREATININE 2.68* 03/15/2014 0514   CREATININE 2.72* 06/20/2013 1026   CALCIUM 8.6 03/15/2014 0514   CALCIUM 9.7 06/20/2013 1026   CALCIUM 9.0 01/28/2008 0850   PROT 6.9 10/01/2014 0835   PROT 7.7 03/13/2014 1052   PROT 6.9 06/02/2010 0931   ALBUMIN 3.0* 03/13/2014 1052   ALBUMIN 2.9* 06/02/2010 0931   AST 14 10/01/2014 0835   AST 20 03/13/2014 1052   ALT 18 10/01/2014 0835   ALT 19 03/13/2014 1052   ALKPHOS 61 10/01/2014 0835   ALKPHOS 50 03/13/2014 1052   BILITOT 0.3 10/01/2014 0835   BILITOT 0.5 03/13/2014 1052   GFRNONAA 22* 03/15/2014 0514   GFRNONAA 22* 06/20/2013 1026   GFRAA 26* 03/15/2014 0514   GFRAA 25* 06/20/2013 1026    Assessment and Plan :  1. Essential hypertension  - CBC with Differential/Platelet  2. Atherosclerosis of native coronary artery of native heart with angina pectoris Rx written for Transport chair  3. Type 2 diabetes mellitus with diabetic nephropathy Rx written for transport chair due to difficulty getting out of car. - Hemoglobin A1c - COMPLETE METABOLIC PANEL WITH GFR  4. Hyperlipidemia  - Lipid Panel With LDL/HDL Ratio 5. Morbid obesity Patient was seen and examined by Dr. Miguel Aschoff, and noted scribed by Webb Laws, [place] MD [facility] 04/05/2015 11:31 AM

## 2015-04-06 DIAGNOSIS — M9901 Segmental and somatic dysfunction of cervical region: Secondary | ICD-10-CM | POA: Diagnosis not present

## 2015-04-06 DIAGNOSIS — M5033 Other cervical disc degeneration, cervicothoracic region: Secondary | ICD-10-CM | POA: Diagnosis not present

## 2015-04-06 DIAGNOSIS — M5136 Other intervertebral disc degeneration, lumbar region: Secondary | ICD-10-CM | POA: Diagnosis not present

## 2015-04-06 DIAGNOSIS — M9903 Segmental and somatic dysfunction of lumbar region: Secondary | ICD-10-CM | POA: Diagnosis not present

## 2015-04-09 DIAGNOSIS — E1121 Type 2 diabetes mellitus with diabetic nephropathy: Secondary | ICD-10-CM | POA: Diagnosis not present

## 2015-04-09 DIAGNOSIS — I10 Essential (primary) hypertension: Secondary | ICD-10-CM | POA: Diagnosis not present

## 2015-04-09 DIAGNOSIS — E785 Hyperlipidemia, unspecified: Secondary | ICD-10-CM | POA: Diagnosis not present

## 2015-04-10 LAB — LIPID PANEL WITH LDL/HDL RATIO
CHOLESTEROL TOTAL: 145 mg/dL (ref 100–199)
HDL: 47 mg/dL (ref >39)
LDL Calculated: 77 mg/dL (ref 0–99)
LDl/HDL Ratio: 1.6 ratio units (ref 0.0–3.6)
Triglycerides: 104 mg/dL (ref 0–149)
VLDL CHOLESTEROL CAL: 21 mg/dL (ref 5–40)

## 2015-04-10 LAB — HEMOGLOBIN A1C
ESTIMATED AVERAGE GLUCOSE: 148 mg/dL
Hgb A1c MFr Bld: 6.8 % — ABNORMAL HIGH (ref 4.8–5.6)

## 2015-04-10 LAB — CBC WITH DIFFERENTIAL/PLATELET
Basophils Absolute: 0 10*3/uL (ref 0.0–0.2)
Basos: 1 %
EOS (ABSOLUTE): 0.3 10*3/uL (ref 0.0–0.4)
Eos: 5 %
Hematocrit: 38.8 % (ref 37.5–51.0)
Hemoglobin: 12.7 g/dL (ref 12.6–17.7)
IMMATURE GRANULOCYTES: 0 %
Immature Grans (Abs): 0 10*3/uL (ref 0.0–0.1)
Lymphocytes Absolute: 1.4 10*3/uL (ref 0.7–3.1)
Lymphs: 24 %
MCH: 30.2 pg (ref 26.6–33.0)
MCHC: 32.7 g/dL (ref 31.5–35.7)
MCV: 92 fL (ref 79–97)
MONOS ABS: 0.5 10*3/uL (ref 0.1–0.9)
Monocytes: 9 %
NEUTROS ABS: 3.7 10*3/uL (ref 1.4–7.0)
NEUTROS PCT: 61 %
PLATELETS: 202 10*3/uL (ref 150–379)
RBC: 4.2 x10E6/uL (ref 4.14–5.80)
RDW: 14.8 % (ref 12.3–15.4)
WBC: 6 10*3/uL (ref 3.4–10.8)

## 2015-04-10 LAB — COMPREHENSIVE METABOLIC PANEL
ALT: 18 IU/L (ref 0–44)
AST: 16 IU/L (ref 0–40)
Albumin/Globulin Ratio: 1.1 (ref 1.1–2.5)
Albumin: 3.6 g/dL (ref 3.5–4.8)
Alkaline Phosphatase: 49 IU/L (ref 39–117)
BILIRUBIN TOTAL: 0.3 mg/dL (ref 0.0–1.2)
BUN/Creatinine Ratio: 13 (ref 10–22)
BUN: 34 mg/dL — ABNORMAL HIGH (ref 8–27)
CHLORIDE: 98 mmol/L (ref 97–108)
CO2: 33 mmol/L — ABNORMAL HIGH (ref 18–29)
Calcium: 9.7 mg/dL (ref 8.6–10.2)
Creatinine, Ser: 2.53 mg/dL — ABNORMAL HIGH (ref 0.76–1.27)
GFR calc Af Amer: 27 mL/min/{1.73_m2} — ABNORMAL LOW (ref >59)
GFR calc non Af Amer: 24 mL/min/{1.73_m2} — ABNORMAL LOW (ref >59)
GLUCOSE: 106 mg/dL — AB (ref 65–99)
Globulin, Total: 3.3 g/dL (ref 1.5–4.5)
POTASSIUM: 4.9 mmol/L (ref 3.5–5.2)
Sodium: 142 mmol/L (ref 134–144)
Total Protein: 6.9 g/dL (ref 6.0–8.5)

## 2015-04-12 ENCOUNTER — Other Ambulatory Visit: Payer: Self-pay | Admitting: *Deleted

## 2015-04-12 MED ORDER — AMIODARONE HCL 200 MG PO TABS: 200.0000 mg | ORAL_TABLET | Freq: Every day | ORAL | Status: DC

## 2015-04-13 DIAGNOSIS — I509 Heart failure, unspecified: Secondary | ICD-10-CM | POA: Diagnosis not present

## 2015-04-15 ENCOUNTER — Other Ambulatory Visit: Payer: Self-pay | Admitting: Family Medicine

## 2015-05-04 DIAGNOSIS — I10 Essential (primary) hypertension: Secondary | ICD-10-CM | POA: Diagnosis not present

## 2015-05-04 DIAGNOSIS — E1122 Type 2 diabetes mellitus with diabetic chronic kidney disease: Secondary | ICD-10-CM | POA: Diagnosis not present

## 2015-05-04 DIAGNOSIS — R600 Localized edema: Secondary | ICD-10-CM | POA: Diagnosis not present

## 2015-05-04 DIAGNOSIS — N2581 Secondary hyperparathyroidism of renal origin: Secondary | ICD-10-CM | POA: Diagnosis not present

## 2015-05-04 DIAGNOSIS — N184 Chronic kidney disease, stage 4 (severe): Secondary | ICD-10-CM | POA: Diagnosis not present

## 2015-05-07 ENCOUNTER — Encounter: Payer: Self-pay | Admitting: *Deleted

## 2015-05-11 DIAGNOSIS — M9901 Segmental and somatic dysfunction of cervical region: Secondary | ICD-10-CM | POA: Diagnosis not present

## 2015-05-11 DIAGNOSIS — M5033 Other cervical disc degeneration, cervicothoracic region: Secondary | ICD-10-CM | POA: Diagnosis not present

## 2015-05-11 DIAGNOSIS — M5136 Other intervertebral disc degeneration, lumbar region: Secondary | ICD-10-CM | POA: Diagnosis not present

## 2015-05-11 DIAGNOSIS — M9903 Segmental and somatic dysfunction of lumbar region: Secondary | ICD-10-CM | POA: Diagnosis not present

## 2015-05-14 DIAGNOSIS — I509 Heart failure, unspecified: Secondary | ICD-10-CM | POA: Diagnosis not present

## 2015-05-17 ENCOUNTER — Encounter: Payer: Self-pay | Admitting: Internal Medicine

## 2015-05-29 ENCOUNTER — Other Ambulatory Visit: Payer: Self-pay | Admitting: Family Medicine

## 2015-06-06 ENCOUNTER — Other Ambulatory Visit: Payer: Self-pay | Admitting: Family Medicine

## 2015-06-11 DIAGNOSIS — B351 Tinea unguium: Secondary | ICD-10-CM | POA: Diagnosis not present

## 2015-06-11 DIAGNOSIS — L97521 Non-pressure chronic ulcer of other part of left foot limited to breakdown of skin: Secondary | ICD-10-CM | POA: Diagnosis not present

## 2015-06-11 DIAGNOSIS — E1140 Type 2 diabetes mellitus with diabetic neuropathy, unspecified: Secondary | ICD-10-CM | POA: Diagnosis not present

## 2015-06-11 DIAGNOSIS — Z794 Long term (current) use of insulin: Secondary | ICD-10-CM | POA: Diagnosis not present

## 2015-06-13 DIAGNOSIS — I509 Heart failure, unspecified: Secondary | ICD-10-CM | POA: Diagnosis not present

## 2015-06-14 DIAGNOSIS — M9901 Segmental and somatic dysfunction of cervical region: Secondary | ICD-10-CM | POA: Diagnosis not present

## 2015-06-14 DIAGNOSIS — M9903 Segmental and somatic dysfunction of lumbar region: Secondary | ICD-10-CM | POA: Diagnosis not present

## 2015-06-14 DIAGNOSIS — M5033 Other cervical disc degeneration, cervicothoracic region: Secondary | ICD-10-CM | POA: Diagnosis not present

## 2015-06-14 DIAGNOSIS — M5136 Other intervertebral disc degeneration, lumbar region: Secondary | ICD-10-CM | POA: Diagnosis not present

## 2015-06-21 ENCOUNTER — Ambulatory Visit (INDEPENDENT_AMBULATORY_CARE_PROVIDER_SITE_OTHER): Payer: Medicare Other | Admitting: Cardiovascular Disease

## 2015-06-21 ENCOUNTER — Encounter: Payer: Self-pay | Admitting: Cardiovascular Disease

## 2015-06-21 VITALS — BP 104/60 | HR 75 | Ht 72.0 in | Wt 268.8 lb

## 2015-06-21 DIAGNOSIS — I4892 Unspecified atrial flutter: Secondary | ICD-10-CM

## 2015-06-21 DIAGNOSIS — I255 Ischemic cardiomyopathy: Secondary | ICD-10-CM

## 2015-06-21 DIAGNOSIS — I5022 Chronic systolic (congestive) heart failure: Secondary | ICD-10-CM

## 2015-06-21 DIAGNOSIS — I4891 Unspecified atrial fibrillation: Secondary | ICD-10-CM | POA: Diagnosis not present

## 2015-06-21 DIAGNOSIS — Z23 Encounter for immunization: Secondary | ICD-10-CM | POA: Diagnosis not present

## 2015-06-21 DIAGNOSIS — I10 Essential (primary) hypertension: Secondary | ICD-10-CM | POA: Diagnosis not present

## 2015-06-21 DIAGNOSIS — E1121 Type 2 diabetes mellitus with diabetic nephropathy: Secondary | ICD-10-CM

## 2015-06-21 NOTE — Progress Notes (Signed)
Patient ID: Marco Bazen Sr., male    DOB: 1935/10/28, 79 y.o.   MRN: [id number]  HPI Comments: [name] is a very pleasant 79 year old gentleman with a history of coronary artery disease, CABG, atrial fibrillation and DVT, on warfarin, diabetes, previous smoking history PVD, carotid endarterectomy, sick sinus syndrome who  presented to the hospital at [facility] after being seen by Dr. Richardson [name] of ear nose throat with increasing shortness of breath, oxygen saturations to the mid 80s on room air. Cardiology was consulted for systolic and diastolic CHF.  Pacemaker is followed by Dr. Caryl [name] He presents today for follow-up of his coronary artery disease He has LIMA to the LAD, vein graft to the circumflex, vein graft to the PL in June 2006 Also has mitral valve replacement with #28 Edwards logic ring S/p Pacemaker placement AAA repair May 2008  In follow-up, he reports that he is doing well. Walks with a cane Denies any problems with hematuria Less near syncope, lightheadedness, orthostasis by holding his hydralazine and isosorbide Blood pressure still running borderline low today but he denies any symptoms Blood pressure at home 893 systolic in a sitting position Minimal lower extremity swelling Creatinine greater than 2, stable over the past year  EKG on today's visit shows paced rhythm, rate 75 bpm Hemoglobin A1c 6.8  Other past medical history left hand weakness started in December 2014.  carotid angiogram showed patent right carotid endarterectomy site, moderate disease on the left etiology of his symptoms was unclear. carotid disease monitored by Dr. Ronalee [name].  He does wear compression hose for his chronic lower extremity edema   Echocardiogram in the hospital 03/12/2012 showed moderate right ventricular pressures 50-60 mmHg consistent with moderate pulmonary hypertension, ejection fraction 30-35%, moderately dilated left atrium, dilated left ventricle.   laboratory creatinine  3.55, BUN 59,  total cholesterol 149, LDL 93, HDL 37   Allergies  Allergen Reactions  . Shellfish Allergy Hives    Outpatient Encounter Prescriptions as of 06/21/2015  Medication Sig  . ACCU-CHEK FASTCLIX LANCETS MISC TEST twice a day if needed  . ACCU-CHEK SMARTVIEW test strip TEST twice a day  . amiodarone (PACERONE) 200 MG tablet Take 1 tablet (200 mg total) by mouth daily.  [name] apixaban (ELIQUIS) 5 MG TABS tablet Take 5 mg by mouth 2 (two) times daily.   . BD INSULIN SYRINGE ULTRAFINE 31G X 5/16" 0.3 ML MISC use twice a day  . carvedilol (COREG) 3.125 MG tablet Take 3.125 mg by mouth every morning.  . carvedilol (COREG) 6.25 MG tablet Take 6.25 mg by mouth every evening.  . cholecalciferol (VITAMIN D) 1000 UNITS tablet Take 1,000 Units by mouth daily.  [name] docusate sodium (COLACE) 100 MG capsule Take 100 mg by mouth 2 (two) times daily.    . furosemide (LASIX) 40 MG tablet Take 1 tablet (40 mg total) by mouth daily.  [name] gabapentin (NEURONTIN) 100 MG capsule take 1 capsule by mouth three times a day  . HYDROcodone-acetaminophen (NORCO) 10-325 MG per tablet Take 1-2 tablets by mouth every 4 (four) hours as needed.  [name] losartan (COZAAR) 25 MG tablet Take 25 mg by mouth 2 (two) times daily.   . Multiple Vitamins-Minerals (MULTI-VITAMIN GUMMIES) CHEW Chew by mouth daily.  . NON FORMULARY 1.5 liters  . omeprazole (PRILOSEC) 20 MG capsule Take 20 mg by mouth every Monday, Wednesday, and Friday. Taking 3 times a week.  . rosuvastatin (CRESTOR) 20 MG tablet Take 1 tablet (20 mg total) by mouth daily.  [name]  Tamsulosin HCl (FLOMAX) 0.4 MG CAPS Take 0.4 mg by mouth at bedtime. 1 po daily  . insulin lispro protamine-lispro (HUMALOG 75/25 MIX) (75-25) 100 UNIT/ML SUSP injection Inject into the skin.  . [DISCONTINUED] calcitRIOL (ROCALTROL) 0.5 MCG capsule take 1 capsule by mouth once daily (Patient not taking: Reported on 06/21/2015)  . [DISCONTINUED] carvedilol (COREG) 6.25 MG tablet Take 1 tablet (6.25  mg total) by mouth daily. (Patient not taking: Reported on 06/21/2015)  . [DISCONTINUED] hydrALAZINE (APRESOLINE) 25 MG tablet Take 12.5 mg by mouth 2 (two) times daily.  . [DISCONTINUED] isosorbide dinitrate (ISORDIL) 30 MG tablet take 1/2 tablet by mouth every morning and 1 tablet at bedtime (Patient not taking: Reported on 06/21/2015)   No facility-administered encounter medications on file as of 06/21/2015.    Past Medical History  Diagnosis Date  . Cardiomyopathy -mixed     EF 30-35% -- 7/13; Pulm HTN and modest RV dysfunction  . Coronary artery disease      The patient had stent to the right coronary artery in 1999.  The patient had unstable angina.  Non-Q-wave myocardial infarction in June 2006.  Coronary artery bypass graft surgery on February 01, 2005, with  mitral valve annuloplasty with   . Sick sinus syndrome ([place])   . Mitral regurgitation--s/p repair     placement of a 28-mm Edwards   ETlogix ring.     Marco [name]      Generator replacement 10/13  . Diabetes mellitus   . Hypertension   . Hyperlipidemia   . BPH (benign prostatic hypertrophy)   . Renal insufficiency Gd 4   . GERD (gastroesophageal reflux disease)   . Chronic systolic heart failure (HCC)     New York Heart Association class II-III   . Obesity   . DVT (deep venous thrombosis) ([place])   . Anemia   . Sleep apnea     Not tolerant of CPAP  . Skin cancer 2013    [name] x 2 back and left neck  . AAA (abdominal aortic aneurysm)repaired 2008    repair  . Atrial fibrillation/Flutter     newly identified 2014  . PVC (premature ventricular contraction)     s/p RFCA JAllred 2011  . Carotid artery occlusion   . History of shingles     Past Surgical History  Procedure Laterality Date  . Coronary artery bypass graft      CABG with a  LIMA to the LAD, SVG to circumflex, SVG to posterolateral June2006.  . Mitral valve repair  2006     mitral valve repair with a #28 Edwards logic ring  . Pacemaker  placement    . Abdominal aortic aneurysm repair       May 2008,   . Cardiac catheterization  2006    @ [facility]  . Insert / replace / remove pacemaker  2006    Dr. Caryl [name]  . Skin cancer excision       x 6  . Cataract extraction      right  . Skin biopsy      left wrist; right elbow  . Transesophageal echocardiogram    . Skin cancer excision    . Permanent pacemaker generator change N/A 06/26/2012    Procedure: PERMANENT PACEMAKER GENERATOR CHANGE;  Surgeon: Deboraha Sprang, MD;  Location: [facility] CATH LAB;  Service: Cardiovascular;  Laterality: N/A;  . Vasectomy      Social History  reports that he quit smoking about 27 years ago.  His smoking use included Cigarettes. He has a 51 pack-year smoking history. He has never used smokeless tobacco. He reports that he drinks about 0.6 oz of alcohol per week. He reports that he does not use illicit drugs.  Family History family history includes Diabetes in his father, sister, and son; Heart attack in his sister, sister, and sister; Heart attack (age of onset: 70) in his brother; Heart attack (age of onset: 16) in his brother; Heart attack (age of onset: 86) in his brother; Heart attack (age of onset: 13) in his father; Heart attack (age of onset: 40) in his mother; Heart disease in his brother and father; Hypertension in his sister.   Review of Systems  Constitutional: Negative.   Respiratory: Negative.   Cardiovascular: Negative.   Gastrointestinal: Negative.   Genitourinary:       Hematuria, flank pain, urgency  Musculoskeletal: Negative.   Neurological: Negative.   Hematological: Negative.   Psychiatric/Behavioral: Negative.   All other systems reviewed and are negative.   BP 104/60 mmHg  Pulse 75  Ht 6' (1.829 m)  Wt 268 lb 12 oz (121.904 kg)  BMI 36.44 kg/m2  Physical Exam  Constitutional: He is oriented to person, place, and time. He appears well-developed and well-nourished.  Obese  HENT:  Head: Normocephalic.  Nose: Nose  normal.  Mouth/Throat: Oropharynx is clear and moist.  Eyes: Conjunctivae are normal. Pupils are equal, round, and reactive to light.  Neck: Normal range of motion. Neck supple. No JVD present.  Cardiovascular: Normal rate, regular rhythm, S1 normal, S2 normal, normal heart sounds and intact distal pulses.  Exam reveals no gallop and no friction rub.   No murmur heard. Pulmonary/Chest: Effort normal and breath sounds normal. No respiratory distress. He has no wheezes. He has no rales. He exhibits no tenderness.  Abdominal: Soft. Bowel sounds are normal. He exhibits no distension. There is no tenderness.  Musculoskeletal: Normal range of motion. He exhibits no edema or tenderness.  Lymphadenopathy:    He has no cervical adenopathy.  Neurological: He is alert and oriented to person, place, and time. Coordination normal.  Skin: Skin is warm and dry. No rash noted. No erythema.  Psychiatric: He has a normal mood and affect. His behavior is normal. Judgment and thought content normal.      Assessment and Plan   Nursing note and vitals reviewed.

## 2015-06-21 NOTE — Assessment & Plan Note (Signed)
Hemoglobin A1c 6.8 Stressed importance of low carbohydrate diet

## 2015-06-21 NOTE — Assessment & Plan Note (Signed)
Unable to exercise. Stressed importance of low carbohydrate diet

## 2015-06-21 NOTE — Assessment & Plan Note (Signed)
Maintaining normal sinus rhythm. Currently on anticoagulation, amiodarone

## 2015-06-21 NOTE — Assessment & Plan Note (Signed)
Appears relatively euvolemic on today's visit Minimal leg edema, likely from venous insufficiency

## 2015-06-21 NOTE — Assessment & Plan Note (Signed)
Blood pressure continues to run low but he is asymptomatic. No medication changes made We will check orthostatics at home. If he has lightheadedness or runs low, recommended he hold his Lasix for a day, drink more fluids

## 2015-06-21 NOTE — Patient Instructions (Signed)
You are doing well. No medication changes were made.  Please call us if you have new issues that need to be addressed before your next appt.  Your physician wants you to follow-up in: 6 months.  You will receive a reminder letter in the mail two months in advance. If you don't receive a letter, please call our office to schedule the follow-up appointment.   

## 2015-06-21 NOTE — Assessment & Plan Note (Signed)
Currently with no symptoms of angina Blood pressure, cholesterol well controlled  A1c 6.8, nonsmoker

## 2015-06-29 DIAGNOSIS — N2581 Secondary hyperparathyroidism of renal origin: Secondary | ICD-10-CM | POA: Diagnosis not present

## 2015-06-29 DIAGNOSIS — R600 Localized edema: Secondary | ICD-10-CM | POA: Diagnosis not present

## 2015-06-29 DIAGNOSIS — I10 Essential (primary) hypertension: Secondary | ICD-10-CM | POA: Diagnosis not present

## 2015-06-29 DIAGNOSIS — N184 Chronic kidney disease, stage 4 (severe): Secondary | ICD-10-CM | POA: Diagnosis not present

## 2015-06-29 DIAGNOSIS — E1122 Type 2 diabetes mellitus with diabetic chronic kidney disease: Secondary | ICD-10-CM | POA: Diagnosis not present

## 2015-07-05 ENCOUNTER — Telehealth: Payer: Self-pay | Admitting: Cardiology

## 2015-07-05 ENCOUNTER — Ambulatory Visit (INDEPENDENT_AMBULATORY_CARE_PROVIDER_SITE_OTHER): Payer: Medicare Other | Admitting: *Deleted

## 2015-07-05 DIAGNOSIS — I495 Sick sinus syndrome: Secondary | ICD-10-CM | POA: Diagnosis not present

## 2015-07-05 NOTE — Telephone Encounter (Signed)
error 

## 2015-07-05 NOTE — Telephone Encounter (Signed)
Spoke with pt and reminded pt of remote transmission that is due today. Pt verbalized understanding.   

## 2015-07-06 NOTE — Progress Notes (Signed)
Remote pacemaker transmission.   

## 2015-07-07 LAB — CUP PACEART REMOTE DEVICE CHECK
Battery Impedance: 204 Ohm
Battery Remaining Longevity: 111 mo
Battery Voltage: 2.79 V
Brady Statistic AS VS Percent: 1 %
Implantable Lead Implant Date: 20060615
Implantable Lead Location: 753859
Lead Channel Impedance Value: 466 Ohm
Lead Channel Pacing Threshold Pulse Width: 0.4 ms
Lead Channel Sensing Intrinsic Amplitude: 11.2 mV
Lead Channel Setting Pacing Amplitude: 2 V
Lead Channel Setting Pacing Amplitude: 2.5 V
Lead Channel Setting Pacing Pulse Width: 0.4 ms
Lead Channel Setting Sensing Sensitivity: 5.6 mV
MDC IDC LEAD IMPLANT DT: 20060615
MDC IDC LEAD LOCATION: 753860
MDC IDC MSMT LEADCHNL RA PACING THRESHOLD AMPLITUDE: 0.5 V
MDC IDC MSMT LEADCHNL RA PACING THRESHOLD PULSEWIDTH: 0.4 ms
MDC IDC MSMT LEADCHNL RV IMPEDANCE VALUE: 447 Ohm
MDC IDC MSMT LEADCHNL RV PACING THRESHOLD AMPLITUDE: 1 V
MDC IDC SESS DTM: 20161107190733
MDC IDC STAT BRADY AP VP PERCENT: 11 %
MDC IDC STAT BRADY AP VS PERCENT: 87 %
MDC IDC STAT BRADY AS VP PERCENT: 1 %

## 2015-07-08 ENCOUNTER — Encounter: Payer: Self-pay | Admitting: Cardiology

## 2015-07-14 DIAGNOSIS — I509 Heart failure, unspecified: Secondary | ICD-10-CM | POA: Diagnosis not present

## 2015-07-19 DIAGNOSIS — I6529 Occlusion and stenosis of unspecified carotid artery: Secondary | ICD-10-CM | POA: Diagnosis not present

## 2015-07-19 DIAGNOSIS — I6523 Occlusion and stenosis of bilateral carotid arteries: Secondary | ICD-10-CM | POA: Diagnosis not present

## 2015-07-19 DIAGNOSIS — E119 Type 2 diabetes mellitus without complications: Secondary | ICD-10-CM | POA: Diagnosis not present

## 2015-07-19 DIAGNOSIS — E785 Hyperlipidemia, unspecified: Secondary | ICD-10-CM | POA: Diagnosis not present

## 2015-07-20 DIAGNOSIS — M9901 Segmental and somatic dysfunction of cervical region: Secondary | ICD-10-CM | POA: Diagnosis not present

## 2015-07-20 DIAGNOSIS — M5136 Other intervertebral disc degeneration, lumbar region: Secondary | ICD-10-CM | POA: Diagnosis not present

## 2015-07-20 DIAGNOSIS — M5033 Other cervical disc degeneration, cervicothoracic region: Secondary | ICD-10-CM | POA: Diagnosis not present

## 2015-07-20 DIAGNOSIS — M9903 Segmental and somatic dysfunction of lumbar region: Secondary | ICD-10-CM | POA: Diagnosis not present

## 2015-07-27 ENCOUNTER — Encounter: Payer: Self-pay | Admitting: Cardiology

## 2015-08-03 ENCOUNTER — Ambulatory Visit: Payer: Medicare Other | Admitting: Family Medicine

## 2015-08-13 DIAGNOSIS — I509 Heart failure, unspecified: Secondary | ICD-10-CM | POA: Diagnosis not present

## 2015-08-16 ENCOUNTER — Ambulatory Visit (INDEPENDENT_AMBULATORY_CARE_PROVIDER_SITE_OTHER): Payer: Medicare Other | Admitting: Family Medicine

## 2015-08-16 VITALS — BP 94/56 | HR 84 | Temp 98.6°F | Resp 14

## 2015-08-16 DIAGNOSIS — I10 Essential (primary) hypertension: Secondary | ICD-10-CM

## 2015-08-16 DIAGNOSIS — Z794 Long term (current) use of insulin: Secondary | ICD-10-CM | POA: Diagnosis not present

## 2015-08-16 DIAGNOSIS — L989 Disorder of the skin and subcutaneous tissue, unspecified: Secondary | ICD-10-CM

## 2015-08-16 DIAGNOSIS — E1121 Type 2 diabetes mellitus with diabetic nephropathy: Secondary | ICD-10-CM

## 2015-08-16 LAB — POCT GLYCOSYLATED HEMOGLOBIN (HGB A1C): HEMOGLOBIN A1C: 6.5

## 2015-08-16 NOTE — Progress Notes (Signed)
Patient ID: Marco Plass Sr., male   DOB: 1935/11/01, 79 y.o.   MRN: [id number]   Marco Sylte Deguire Sr.  MRN: [id number] DOB: 03-24-1936  Subjective:  HPI   1. Type 2 diabetes mellitus with diabetic nephropathy, with long-term current use of insulin [facility]) The patient is a 79 year old male who presents for follow up of his diabetes.  His last visit was on 04/05/15.  His A1C on that visit was 6.8.  No management changes were made.  He is checking his glucose at home about 3-4 times weekly.  His readings have been running in the 90s to 130s, with rare reading higher.  He has had hypoglycemia symptoms about 1-2 times per month, they have always been after he has taken his insulin.  The patient states that he takes an amount of insulin based on what he eats.  He is taking Humalog 75/25.  He usually take 15 units if he has eaten regular meals.      2. Essential hypertension The patient is also here for follow up of his hypertension.  He states he has been checking his readings outside of the office and getting readings of 130's over 70's-80's.  Occasional he gets low readings.  Today he had reading in the left of 112/66 and in the right arm it was 90/49.  He states that he did feel like he was washed out.   Patient Active Problem List   Diagnosis Date Noted  . Absolute anemia 01/28/2015  . Atherosclerosis of coronary artery 01/28/2015  . Barrett's esophagus 01/28/2015  . Basal cell carcinoma of face 01/28/2015  . Benign fibroma of prostate 01/28/2015  . CCF (congestive cardiac failure) ([place]) 01/28/2015  . Diabetes ([place]) 01/28/2015  . Gastro-esophageal reflux disease without esophagitis 01/28/2015  . Irritable colon 01/28/2015  . Cannot sleep 01/28/2015  . Pleural cavity effusion 01/28/2015  . Apnea, sleep 01/28/2015  . Hematuria 12/21/2014  . Left hand weakness 09/23/2013  . Essential hypertension 06/23/2013  . Chronic systolic heart failure ([place]) 06/10/2013  . Hypotension,  postural 06/10/2013  . Hyperkalemia 06/10/2013  . Adjustment of cardiac pacemaker 06/10/2013  . Atrial fibrillation and flutter ([place]) 10/22/2012  . Pacemaker-Medtronic 06/19/2012  . Pulmonary hypertension ([place]) 03/18/2012  . Occlusion and stenosis of carotid artery without mention of cerebral infarction 02/08/2012  . Morbid obesity ([place]) 08/05/2010  . Sinoatrial node dysfunction (HCC) 06/02/2010  . Stage 4 chronic renal impairment associated with type 2 diabetes mellitus ([place]) 08/09/2009  . AAA 04/21/2009  . Ischemic cardiomyopathy  s/p CABG 11/23/2008  . MITRAL REGURGITATION 11/22/2008  . CAD (coronary artery disease) 11/22/2008  . Hyperlipidemia 03/02/2008  . ALLERGIC RHINITIS 03/02/2008  . OSA (obstructive sleep apnea) 03/02/2008    Past Medical History  Diagnosis Date  . Cardiomyopathy -mixed     EF 30-35% -- 7/13; Pulm HTN and modest RV dysfunction  . Coronary artery disease      The patient had stent to the right coronary artery in 1999.  The patient had unstable angina.  Non-Q-wave myocardial infarction in June 2006.  Coronary artery bypass graft surgery on February 01, 2005, with  mitral valve annuloplasty with   . Sick sinus syndrome ([place])   . Mitral regurgitation--s/p repair     placement of a 28-mm Edwards   ETlogix ring.     [name]      Generator replacement 10/13  . Diabetes mellitus   . Hypertension   . Hyperlipidemia   .  BPH (benign prostatic hypertrophy)   . Renal insufficiency Gd 4   . GERD (gastroesophageal reflux disease)   . Chronic systolic heart failure (HCC)     New York Heart Association class II-III   . Obesity   . DVT (deep venous thrombosis) ([place])   . Anemia   . Sleep apnea     Not tolerant of CPAP  . Skin cancer 2013    [name] x 2 back and left neck  . AAA (abdominal aortic aneurysm)repaired 2008    repair  . Atrial fibrillation/Flutter     newly identified 2014  . PVC (premature ventricular contraction)     s/p RFCA  JAllred 2011  . Carotid artery occlusion   . History of shingles     Social History   Social History  . Marital Status: Married    Spouse Name: N/A  . Number of Children: N/A  . Years of Education: N/A   Occupational History  . Retired     Engineer, structural in [place]  .     Social History Main Topics  . Smoking status: Former Smoker -- 1.50 packs/day for 34 years    Types: Cigarettes    Quit date: 08/28/1987  . Smokeless tobacco: Never Used  . Alcohol Use: 0.6 oz/week    1 Cans of beer per week     Comment: Occasionally but none since his surgery in June  . Drug Use: No  . Sexual Activity: Not on file   Other Topics Concern  . Not on file   Social History Narrative    Outpatient Prescriptions Prior to Visit  Medication Sig Dispense Refill  . ACCU-CHEK FASTCLIX LANCETS MISC TEST twice a day if needed 102 each 12  . ACCU-CHEK SMARTVIEW test strip TEST twice a day 100 each 12  . amiodarone (PACERONE) 200 MG tablet Take 1 tablet (200 mg total) by mouth daily. 30 tablet 3  . apixaban (ELIQUIS) 5 MG TABS tablet Take 5 mg by mouth 2 (two) times daily.     . BD INSULIN SYRINGE ULTRAFINE 31G X 5/16" 0.3 ML MISC use twice a day 100 each 12  . carvedilol (COREG) 3.125 MG tablet Take 3.125 mg by mouth every morning.    . carvedilol (COREG) 6.25 MG tablet Take 6.25 mg by mouth every evening.    . cholecalciferol (VITAMIN D) 1000 UNITS tablet Take 1,000 Units by mouth daily.    [name] docusate sodium (COLACE) 100 MG capsule Take 100 mg by mouth 2 (two) times daily.      . furosemide (LASIX) 40 MG tablet Take 1 tablet (40 mg total) by mouth daily. 30 tablet   . gabapentin (NEURONTIN) 100 MG capsule take 1 capsule by mouth three times a day 90 capsule 3  . HYDROcodone-acetaminophen (NORCO) 10-325 MG per tablet Take 1-2 tablets by mouth every 4 (four) hours as needed. 100 tablet 0  . insulin lispro protamine-lispro (HUMALOG 75/25 MIX) (75-25) 100 UNIT/ML SUSP injection Inject into the  skin.    [name] losartan (COZAAR) 25 MG tablet Take 25 mg by mouth 2 (two) times daily.   0  . Multiple Vitamins-Minerals (MULTI-VITAMIN GUMMIES) CHEW Chew by mouth daily.    [name] omeprazole (PRILOSEC) 20 MG capsule Take 20 mg by mouth every Monday, Wednesday, and Friday. Taking 3 times a week.    . rosuvastatin (CRESTOR) 20 MG tablet Take 1 tablet (20 mg total) by mouth daily. 90 tablet 3  . Tamsulosin HCl (FLOMAX) 0.4  MG CAPS Take 0.4 mg by mouth at bedtime. 1 po daily    . NON FORMULARY 1.5 liters     No facility-administered medications prior to visit.   Outpatient Encounter Prescriptions as of 08/16/2015  Medication Sig Note  . ACCU-CHEK FASTCLIX LANCETS MISC TEST twice a day if needed   . ACCU-CHEK SMARTVIEW test strip TEST twice a day   . amiodarone (PACERONE) 200 MG tablet Take 1 tablet (200 mg total) by mouth daily.   [name] apixaban (ELIQUIS) 5 MG TABS tablet Take 5 mg by mouth 2 (two) times daily.  01/28/2015: Received from: [facility]  . BD INSULIN SYRINGE ULTRAFINE 31G X 5/16" 0.3 ML MISC use twice a day   . carvedilol (COREG) 3.125 MG tablet Take 3.125 mg by mouth every morning.   . carvedilol (COREG) 6.25 MG tablet Take 6.25 mg by mouth every evening.   . cholecalciferol (VITAMIN D) 1000 UNITS tablet Take 1,000 Units by mouth daily.   [name] docusate sodium (COLACE) 100 MG capsule Take 100 mg by mouth 2 (two) times daily.     . finasteride (PROSCAR) 5 MG tablet Take 5 mg by mouth daily.   . furosemide (LASIX) 40 MG tablet Take 1 tablet (40 mg total) by mouth daily.   [name] gabapentin (NEURONTIN) 100 MG capsule take 1 capsule by mouth three times a day   . HYDROcodone-acetaminophen (NORCO) 10-325 MG per tablet Take 1-2 tablets by mouth every 4 (four) hours as needed.   . insulin lispro protamine-lispro (HUMALOG 75/25 MIX) (75-25) 100 UNIT/ML SUSP injection Inject into the skin. 01/28/2015: Received from: [facility]  . losartan (COZAAR) 25 MG tablet Take 25 mg by  mouth 2 (two) times daily.  09/29/2014: Received from: External Pharmacy  . Multiple Vitamins-Minerals (MULTI-VITAMIN GUMMIES) CHEW Chew by mouth daily.   [name] omeprazole (PRILOSEC) 20 MG capsule Take 20 mg by mouth every Monday, Wednesday, and Friday. Taking 3 times a week.   . rosuvastatin (CRESTOR) 20 MG tablet Take 1 tablet (20 mg total) by mouth daily.   . Tamsulosin HCl (FLOMAX) 0.4 MG CAPS Take 0.4 mg by mouth at bedtime. 1 po daily   . NON FORMULARY 1.5 liters    No facility-administered encounter medications on file as of 08/16/2015.    Allergies  Allergen Reactions  . Shellfish Allergy Hives    Review of Systems  Constitutional: Positive for weight loss. Negative for fever, chills and diaphoresis.  Respiratory: Positive for cough and sputum production. Negative for hemoptysis, shortness of breath and wheezing.   Cardiovascular: Positive for orthopnea (Patient has to sleep upright, chronic and unchanged.). Negative for chest pain, palpitations and leg swelling.  Gastrointestinal: Negative.   Neurological: Positive for weakness.  Endo/Heme/Allergies: Negative.   Psychiatric/Behavioral: Negative.    Objective:  BP 94/56 mmHg  Pulse 84  Temp(Src) 98.6 F (37 C) (Oral)  Resp 14  Physical Exam  Constitutional: He is oriented to person, place, and time and well-developed, well-nourished, and in no distress.  Morbidly obese white male in no acute distress  HENT:  Head: Normocephalic and atraumatic.  Eyes: Pupils are equal, round, and reactive to light.  Neck: Normal range of motion. Neck supple.  Cardiovascular: Normal rate, regular rhythm and normal heart sounds.   Pulmonary/Chest: Effort normal and breath sounds normal.  Neurological: He is alert and oriented to person, place, and time.  Skin: Skin is warm and dry.  Irritated/infected seborrheic keratosis on the left arm.  Psychiatric: Mood,  memory, affect and judgment normal.    Assessment and Plan :   1. Type 2  diabetes mellitus with diabetic nephropathy, with long-term current use of insulin (HCC) Patient to take Humalog 75/25 twice daily at 12 units.  He is not to adjust his dose until we see him again.   - POCT glycosylated hemoglobin (Hb A1C)--6.5 today--was 6.8.  2. Essential hypertension   3. Skin lesion Patient with an irritated seborrheic keratosis of the left arm. - Ambulatory referral to Dermatology 4. Morbid obesity 5. Hypertensive nephropathy 6. Diabetic neuropathy 7. Obstructive sleep apnea 8. Atrial fibrillation 9. Ischemic cardiomyopathy/status post CABG I have done the exam and reviewed the above chart and it is accurate to the best of my knowledge.   Miguel Aschoff MD [facility] 08/16/2015 11:34 AM

## 2015-08-17 DIAGNOSIS — M9903 Segmental and somatic dysfunction of lumbar region: Secondary | ICD-10-CM | POA: Diagnosis not present

## 2015-08-17 DIAGNOSIS — M5136 Other intervertebral disc degeneration, lumbar region: Secondary | ICD-10-CM | POA: Diagnosis not present

## 2015-08-17 DIAGNOSIS — M9901 Segmental and somatic dysfunction of cervical region: Secondary | ICD-10-CM | POA: Diagnosis not present

## 2015-08-17 DIAGNOSIS — M5033 Other cervical disc degeneration, cervicothoracic region: Secondary | ICD-10-CM | POA: Diagnosis not present

## 2015-08-21 ENCOUNTER — Other Ambulatory Visit: Payer: Self-pay | Admitting: Cardiovascular Disease

## 2015-09-13 DIAGNOSIS — B351 Tinea unguium: Secondary | ICD-10-CM | POA: Diagnosis not present

## 2015-09-13 DIAGNOSIS — Z794 Long term (current) use of insulin: Secondary | ICD-10-CM | POA: Diagnosis not present

## 2015-09-13 DIAGNOSIS — I509 Heart failure, unspecified: Secondary | ICD-10-CM | POA: Diagnosis not present

## 2015-09-13 DIAGNOSIS — E1140 Type 2 diabetes mellitus with diabetic neuropathy, unspecified: Secondary | ICD-10-CM | POA: Diagnosis not present

## 2015-09-13 DIAGNOSIS — L97521 Non-pressure chronic ulcer of other part of left foot limited to breakdown of skin: Secondary | ICD-10-CM | POA: Diagnosis not present

## 2015-09-21 DIAGNOSIS — E1122 Type 2 diabetes mellitus with diabetic chronic kidney disease: Secondary | ICD-10-CM | POA: Diagnosis not present

## 2015-09-21 DIAGNOSIS — N184 Chronic kidney disease, stage 4 (severe): Secondary | ICD-10-CM | POA: Diagnosis not present

## 2015-09-21 DIAGNOSIS — R600 Localized edema: Secondary | ICD-10-CM | POA: Diagnosis not present

## 2015-09-21 DIAGNOSIS — I10 Essential (primary) hypertension: Secondary | ICD-10-CM | POA: Diagnosis not present

## 2015-09-21 DIAGNOSIS — N2581 Secondary hyperparathyroidism of renal origin: Secondary | ICD-10-CM | POA: Diagnosis not present

## 2015-09-28 DIAGNOSIS — M9903 Segmental and somatic dysfunction of lumbar region: Secondary | ICD-10-CM | POA: Diagnosis not present

## 2015-09-28 DIAGNOSIS — M9901 Segmental and somatic dysfunction of cervical region: Secondary | ICD-10-CM | POA: Diagnosis not present

## 2015-09-28 DIAGNOSIS — M5033 Other cervical disc degeneration, cervicothoracic region: Secondary | ICD-10-CM | POA: Diagnosis not present

## 2015-09-28 DIAGNOSIS — M5136 Other intervertebral disc degeneration, lumbar region: Secondary | ICD-10-CM | POA: Diagnosis not present

## 2015-10-03 ENCOUNTER — Other Ambulatory Visit: Payer: Self-pay | Admitting: Cardiovascular Disease

## 2015-10-05 ENCOUNTER — Ambulatory Visit (INDEPENDENT_AMBULATORY_CARE_PROVIDER_SITE_OTHER): Payer: Medicare Other | Admitting: Internal Medicine

## 2015-10-05 ENCOUNTER — Encounter: Payer: Self-pay | Admitting: Internal Medicine

## 2015-10-05 VITALS — BP 128/72 | HR 81 | Ht 72.0 in | Wt 260.0 lb

## 2015-10-05 DIAGNOSIS — I495 Sick sinus syndrome: Secondary | ICD-10-CM

## 2015-10-05 DIAGNOSIS — I4892 Unspecified atrial flutter: Secondary | ICD-10-CM | POA: Diagnosis not present

## 2015-10-05 DIAGNOSIS — Z950 Presence of cardiac pacemaker: Secondary | ICD-10-CM | POA: Diagnosis not present

## 2015-10-05 DIAGNOSIS — Z79899 Other long term (current) drug therapy: Secondary | ICD-10-CM

## 2015-10-05 DIAGNOSIS — I4891 Unspecified atrial fibrillation: Secondary | ICD-10-CM | POA: Diagnosis not present

## 2015-10-05 NOTE — Patient Instructions (Signed)
Medication Instructions: - Your physician recommends that you continue on your current medications as directed. Please refer to the Current Medication list given to you today.  Labwork: - Your physician recommends that you have lab work today: CMET/ TSH  Procedures/Testing: - none  Follow-Up: - Remote monitoring is used to monitor your Pacemaker of ICD from home. This monitoring reduces the number of office visits required to check your device to one time per year. It allows [place] to keep an eye on the functioning of your device to ensure it is working properly. You are scheduled for a device check from home on 01/04/16. You may send your transmission at any time that day. If you have a wireless device, the transmission will be sent automatically. After your physician reviews your transmission, you will receive a postcard with your next transmission date.  - Your physician wants you to follow-up in: 6 months with Dr. [name]. You will receive a reminder letter in the mail two months in advance. If you don't receive a letter, please call our office to schedule the follow-up appointment.  Any Additional Special Instructions Will Be Listed Below (If Applicable).     If you need a refill on your cardiac medications before your next appointment, please call your pharmacy.

## 2015-10-05 NOTE — Progress Notes (Signed)
Patient Care Team: [name], MD as PCP - General (Unknown Physician Specialty) Minus Breeding, MD (Cardiology)   HPI  Marco Few Tupper Sr. is a 80 y.o. male Seen in follow-up for a pacemaker implanted for tachybradycardia syndrome. He has a history of atrial arrhythmias both fibrillation and flutter. He has  ischemic cardiomyopathy with prior bypass surgery and PCI. He also has remote mitral valve repair.  He has a history of atrial fibrillation implanted with a Medtronic dual-chamber device for sick sinus syndrome.Marco [name] He was seen in February 2014  urgently because of symptomatic tachypalpitations. He underwent cardioversion with TE . Most recent ejection fraction at the time of TEE 2/14--25-30% with evidence of pulmonary hypertension and biatrial enlargement     he has a history of renal insufficiency. Most recent creatinine on our record is 2.53 down from 3.7   He is followed by  Nephrology here in [place]. He also has blood work checked regularly by Dr. Rosanna Randy but it's been about 4 or 5 months.   he has a mild cough; is fairly proximal in his airway. It is not changed over recent months. Dyspnea is stable.      Past Medical History  Diagnosis Date  . Cardiomyopathy -mixed     EF 30-35% -- 7/13; Pulm HTN and modest RV dysfunction  . Coronary artery disease      The patient had stent to the right coronary artery in 1999.  The patient had unstable angina.  Non-Q-wave myocardial infarction in June 2006.  Coronary artery bypass graft surgery on February 01, 2005, with  mitral valve annuloplasty with   . Sick sinus syndrome ([place])   . Mitral regurgitation--s/p repair     placement of a 28-mm Edwards   ETlogix ring.     Marco [name]      Generator replacement 10/13  . Diabetes mellitus   . Hypertension   . Hyperlipidemia   . BPH (benign prostatic hypertrophy)   . Renal insufficiency Gd 4   . GERD (gastroesophageal reflux disease)   . Chronic systolic  heart failure (HCC)     New York Heart Association class II-III   . Obesity   . DVT (deep venous thrombosis) ([place])   . Anemia   . Sleep apnea     Not tolerant of CPAP  . Skin cancer 2013    Marco [name] x 2 back and left neck  . AAA (abdominal aortic aneurysm)repaired 2008    repair  . Atrial fibrillation/Flutter     newly identified 2014  . PVC (premature ventricular contraction)     s/p RFCA JAllred 2011  . Carotid artery occlusion   . History of shingles     Past Surgical History  Procedure Laterality Date  . Coronary artery bypass graft      CABG with a  LIMA to the LAD, SVG to circumflex, SVG to posterolateral June2006.  . Mitral valve repair  2006     mitral valve repair with a #28 Edwards logic ring  . Pacemaker placement    . Abdominal aortic aneurysm repair       May 2008,   . Cardiac catheterization  2006    @ [facility]  . Insert / replace / remove pacemaker  2006    Dr. [name]  . Skin cancer excision       x 6  . Cataract extraction      right  . Skin biopsy  left wrist; right elbow  . Transesophageal echocardiogram    . Skin cancer excision    . Permanent pacemaker generator change N/A 06/26/2012    Procedure: PERMANENT PACEMAKER GENERATOR CHANGE;  Surgeon: Deboraha Sprang, MD;  Location: [facility] CATH LAB;  Service: Cardiovascular;  Laterality: N/A;  . Vasectomy      Current Outpatient Prescriptions  Medication Sig Dispense Refill  . ACCU-CHEK FASTCLIX LANCETS MISC TEST twice a day if needed 102 each 12  . ACCU-CHEK SMARTVIEW test strip TEST twice a day 100 each 12  . amiodarone (PACERONE) 200 MG tablet take 1 tablet by mouth once daily 30 tablet 3  . apixaban (ELIQUIS) 5 MG TABS tablet Take 5 mg by mouth 2 (two) times daily.     . BD INSULIN SYRINGE ULTRAFINE 31G X 5/16" 0.3 ML MISC use twice a day 100 each 12  . carvedilol (COREG) 3.125 MG tablet take 1 tablet by mouth every morning 90 tablet 3  . carvedilol (COREG) 6.25 MG tablet Take 6.25 mg by mouth  every evening.    . cholecalciferol (VITAMIN D) 1000 UNITS tablet Take 1,000 Units by mouth daily.    Marco [name] docusate sodium (COLACE) 100 MG capsule Take 100 mg by mouth 2 (two) times daily.      . finasteride (PROSCAR) 5 MG tablet Take 5 mg by mouth daily.    . furosemide (LASIX) 40 MG tablet Take 1 tablet (40 mg total) by mouth daily. 30 tablet   . insulin lispro protamine-lispro (HUMALOG 75/25 MIX) (75-25) 100 UNIT/ML SUSP injection Inject into the skin.    Marco [name] losartan (COZAAR) 25 MG tablet Take 25 mg by mouth 2 (two) times daily.   0  . Multiple Vitamins-Minerals (MULTI-VITAMIN GUMMIES) CHEW Chew by mouth daily.    . NON FORMULARY 1.5 liters    . omeprazole (PRILOSEC) 20 MG capsule Take 20 mg by mouth every Monday, Wednesday, and Friday. Taking 3 times a week.    . rosuvastatin (CRESTOR) 20 MG tablet take 1 tablet by mouth at bedtime 90 tablet 3  . Tamsulosin HCl (FLOMAX) 0.4 MG CAPS Take 0.4 mg by mouth at bedtime. 1 po daily     No current facility-administered medications for this visit.    Allergies  Allergen Reactions  . Shellfish Allergy Hives    Review of Systems negative except from HPI and PMH  Physical Exam BP 128/72 mmHg  Pulse 81  Ht 6' (1.829 m)  Wt 265 lb (120.203 kg)  BMI 35.93 kg/m2 Well developed and morbidly obese Caucasian male in no acute distress HENT normal E scleral and icterus clear Neck Supplechief Clear to ausculation Device pocket well healed; without hematoma or erythema.  There is no tethering  regular rate and rhythm,   Soft with active bowel sounds No clubbing cyanosis 1+ Edema Alert and oriented, there is weakness with finger extension and hypoventilating Skin Warm and Dry  ECG  Was not ordered today; tracing 06/21/2015 atrial pacing with intrinsic conduction  Intervals 25/13/40    Assessment and  Plan  Atrial fibrillation/flutter  Chronotropic incompetence/sinus node dysfunction  Ischemic cardiomyopathy  Status post CABG  Congestive  heart failure-chronic-systolic  Pacemaker-Medtronic  High risk medication surveillance  Cough  Hypertension  Blood pressure is reasonably controlled.   mildly fluid overloaded. He thinks his edema however is at baseline.  With his renal insufficiency, I will defer to nephrology for management of his fluids.   I worry a little bit about his cough with  his use of amiodarone. We will keep a close eye on this.  Without symptoms of ischemia  We will  Draw amiodarone surveillance labs today. We will see him in 6 months.  He was concerned about his nocturnal heart rates at 60; I reviewed with him the sleep mode function

## 2015-10-06 LAB — CUP PACEART INCLINIC DEVICE CHECK
Battery Impedance: 180 Ohm
Battery Remaining Longevity: 116 mo
Brady Statistic AP VP Percent: 10 %
Brady Statistic AS VS Percent: 1 %
Implantable Lead Implant Date: 20060615
Implantable Lead Location: 753859
Implantable Lead Model: 5076
Lead Channel Impedance Value: 494 Ohm
Lead Channel Pacing Threshold Amplitude: 0.5 V
Lead Channel Sensing Intrinsic Amplitude: 11.2 mV
Lead Channel Setting Pacing Pulse Width: 0.4 ms
MDC IDC LEAD IMPLANT DT: 20060615
MDC IDC LEAD LOCATION: 753860
MDC IDC MSMT BATTERY VOLTAGE: 2.8 V
MDC IDC MSMT LEADCHNL RA PACING THRESHOLD PULSEWIDTH: 0.4 ms
MDC IDC MSMT LEADCHNL RV IMPEDANCE VALUE: 453 Ohm
MDC IDC MSMT LEADCHNL RV PACING THRESHOLD AMPLITUDE: 1.25 V
MDC IDC MSMT LEADCHNL RV PACING THRESHOLD PULSEWIDTH: 0.4 ms
MDC IDC SESS DTM: 20170207172505
MDC IDC SET LEADCHNL RA PACING AMPLITUDE: 2 V
MDC IDC SET LEADCHNL RV PACING AMPLITUDE: 2.5 V
MDC IDC SET LEADCHNL RV SENSING SENSITIVITY: 5.6 mV
MDC IDC STAT BRADY AP VS PERCENT: 88 %
MDC IDC STAT BRADY AS VP PERCENT: 1 %

## 2015-10-06 LAB — COMPREHENSIVE METABOLIC PANEL
A/G RATIO: 1 — AB (ref 1.1–2.5)
ALK PHOS: 48 IU/L (ref 39–117)
ALT: 15 IU/L (ref 0–44)
AST: 17 IU/L (ref 0–40)
Albumin: 3.6 g/dL (ref 3.5–4.8)
BILIRUBIN TOTAL: 0.3 mg/dL (ref 0.0–1.2)
BUN/Creatinine Ratio: 15 (ref 10–22)
BUN: 40 mg/dL — ABNORMAL HIGH (ref 8–27)
CALCIUM: 9.7 mg/dL (ref 8.6–10.2)
CHLORIDE: 99 mmol/L (ref 96–106)
CO2: 27 mmol/L (ref 18–29)
Creatinine, Ser: 2.67 mg/dL — ABNORMAL HIGH (ref 0.76–1.27)
GFR calc Af Amer: 25 mL/min/{1.73_m2} — ABNORMAL LOW (ref >59)
GFR calc non Af Amer: 22 mL/min/{1.73_m2} — ABNORMAL LOW (ref >59)
GLOBULIN, TOTAL: 3.5 g/dL (ref 1.5–4.5)
Glucose: 98 mg/dL (ref 65–99)
POTASSIUM: 4.6 mmol/L (ref 3.5–5.2)
SODIUM: 143 mmol/L (ref 134–144)
Total Protein: 7.1 g/dL (ref 6.0–8.5)

## 2015-10-06 LAB — TSH: TSH: 1.76 u[IU]/mL (ref 0.450–4.500)

## 2015-10-13 DIAGNOSIS — L570 Actinic keratosis: Secondary | ICD-10-CM | POA: Diagnosis not present

## 2015-10-13 DIAGNOSIS — L821 Other seborrheic keratosis: Secondary | ICD-10-CM | POA: Diagnosis not present

## 2015-10-13 DIAGNOSIS — D485 Neoplasm of uncertain behavior of skin: Secondary | ICD-10-CM | POA: Diagnosis not present

## 2015-10-13 DIAGNOSIS — L820 Inflamed seborrheic keratosis: Secondary | ICD-10-CM | POA: Diagnosis not present

## 2015-10-14 ENCOUNTER — Encounter: Payer: Self-pay | Admitting: Internal Medicine

## 2015-10-14 DIAGNOSIS — I509 Heart failure, unspecified: Secondary | ICD-10-CM | POA: Diagnosis not present

## 2015-10-19 ENCOUNTER — Other Ambulatory Visit: Payer: Self-pay | Admitting: Family Medicine

## 2015-10-26 DIAGNOSIS — M5136 Other intervertebral disc degeneration, lumbar region: Secondary | ICD-10-CM | POA: Diagnosis not present

## 2015-10-26 DIAGNOSIS — M9901 Segmental and somatic dysfunction of cervical region: Secondary | ICD-10-CM | POA: Diagnosis not present

## 2015-10-26 DIAGNOSIS — M9903 Segmental and somatic dysfunction of lumbar region: Secondary | ICD-10-CM | POA: Diagnosis not present

## 2015-10-26 DIAGNOSIS — M5033 Other cervical disc degeneration, cervicothoracic region: Secondary | ICD-10-CM | POA: Diagnosis not present

## 2015-10-28 ENCOUNTER — Observation Stay
Admission: EM | Admit: 2015-10-28 | Discharge: 2015-10-29 | Disposition: A | Discharge status: Home / Self Care | Payer: Medicare Other | Attending: Internal Medicine | Admitting: Internal Medicine

## 2015-10-28 ENCOUNTER — Emergency Department: Payer: Medicare Other

## 2015-10-28 ENCOUNTER — Encounter: Payer: Self-pay | Admitting: Emergency Medicine

## 2015-10-28 DIAGNOSIS — I130 Hypertensive heart and chronic kidney disease with heart failure and stage 1 through stage 4 chronic kidney disease, or unspecified chronic kidney disease: Secondary | ICD-10-CM | POA: Insufficient documentation

## 2015-10-28 DIAGNOSIS — I517 Cardiomegaly: Secondary | ICD-10-CM | POA: Insufficient documentation

## 2015-10-28 DIAGNOSIS — A09 Infectious gastroenteritis and colitis, unspecified: Secondary | ICD-10-CM | POA: Diagnosis not present

## 2015-10-28 DIAGNOSIS — K210 Gastro-esophageal reflux disease with esophagitis: Secondary | ICD-10-CM | POA: Diagnosis not present

## 2015-10-28 DIAGNOSIS — R112 Nausea with vomiting, unspecified: Secondary | ICD-10-CM | POA: Diagnosis not present

## 2015-10-28 DIAGNOSIS — R1110 Vomiting, unspecified: Secondary | ICD-10-CM | POA: Insufficient documentation

## 2015-10-28 DIAGNOSIS — E1122 Type 2 diabetes mellitus with diabetic chronic kidney disease: Secondary | ICD-10-CM | POA: Diagnosis not present

## 2015-10-28 DIAGNOSIS — K2270 Barrett's esophagus without dysplasia: Secondary | ICD-10-CM | POA: Insufficient documentation

## 2015-10-28 DIAGNOSIS — Z85828 Personal history of other malignant neoplasm of skin: Secondary | ICD-10-CM | POA: Insufficient documentation

## 2015-10-28 DIAGNOSIS — E785 Hyperlipidemia, unspecified: Secondary | ICD-10-CM | POA: Insufficient documentation

## 2015-10-28 DIAGNOSIS — G4733 Obstructive sleep apnea (adult) (pediatric): Secondary | ICD-10-CM | POA: Diagnosis not present

## 2015-10-28 DIAGNOSIS — Z794 Long term (current) use of insulin: Secondary | ICD-10-CM | POA: Insufficient documentation

## 2015-10-28 DIAGNOSIS — R0789 Other chest pain: Secondary | ICD-10-CM | POA: Diagnosis not present

## 2015-10-28 DIAGNOSIS — I272 Other secondary pulmonary hypertension: Secondary | ICD-10-CM | POA: Insufficient documentation

## 2015-10-28 DIAGNOSIS — Z952 Presence of prosthetic heart valve: Secondary | ICD-10-CM | POA: Diagnosis not present

## 2015-10-28 DIAGNOSIS — Z87891 Personal history of nicotine dependence: Secondary | ICD-10-CM | POA: Insufficient documentation

## 2015-10-28 DIAGNOSIS — R319 Hematuria, unspecified: Secondary | ICD-10-CM | POA: Insufficient documentation

## 2015-10-28 DIAGNOSIS — Z7901 Long term (current) use of anticoagulants: Secondary | ICD-10-CM | POA: Insufficient documentation

## 2015-10-28 DIAGNOSIS — I255 Ischemic cardiomyopathy: Secondary | ICD-10-CM | POA: Diagnosis not present

## 2015-10-28 DIAGNOSIS — J90 Pleural effusion, not elsewhere classified: Secondary | ICD-10-CM | POA: Diagnosis not present

## 2015-10-28 DIAGNOSIS — I2510 Atherosclerotic heart disease of native coronary artery without angina pectoris: Secondary | ICD-10-CM | POA: Insufficient documentation

## 2015-10-28 DIAGNOSIS — I340 Nonrheumatic mitral (valve) insufficiency: Secondary | ICD-10-CM | POA: Insufficient documentation

## 2015-10-28 DIAGNOSIS — I252 Old myocardial infarction: Secondary | ICD-10-CM | POA: Insufficient documentation

## 2015-10-28 DIAGNOSIS — E119 Type 2 diabetes mellitus without complications: Secondary | ICD-10-CM | POA: Diagnosis not present

## 2015-10-28 DIAGNOSIS — Z91013 Allergy to seafood: Secondary | ICD-10-CM | POA: Insufficient documentation

## 2015-10-28 DIAGNOSIS — R531 Weakness: Secondary | ICD-10-CM | POA: Diagnosis not present

## 2015-10-28 DIAGNOSIS — I4581 Long QT syndrome: Secondary | ICD-10-CM | POA: Diagnosis not present

## 2015-10-28 DIAGNOSIS — Z833 Family history of diabetes mellitus: Secondary | ICD-10-CM | POA: Diagnosis not present

## 2015-10-28 DIAGNOSIS — Z6835 Body mass index (BMI) 35.0-35.9, adult: Secondary | ICD-10-CM | POA: Insufficient documentation

## 2015-10-28 DIAGNOSIS — I4892 Unspecified atrial flutter: Secondary | ICD-10-CM | POA: Diagnosis not present

## 2015-10-28 DIAGNOSIS — R072 Precordial pain: Secondary | ICD-10-CM

## 2015-10-28 DIAGNOSIS — K589 Irritable bowel syndrome without diarrhea: Secondary | ICD-10-CM | POA: Insufficient documentation

## 2015-10-28 DIAGNOSIS — R918 Other nonspecific abnormal finding of lung field: Secondary | ICD-10-CM | POA: Insufficient documentation

## 2015-10-28 DIAGNOSIS — Z8679 Personal history of other diseases of the circulatory system: Secondary | ICD-10-CM | POA: Insufficient documentation

## 2015-10-28 DIAGNOSIS — K529 Noninfective gastroenteritis and colitis, unspecified: Secondary | ICD-10-CM | POA: Diagnosis not present

## 2015-10-28 DIAGNOSIS — D649 Anemia, unspecified: Secondary | ICD-10-CM | POA: Diagnosis not present

## 2015-10-28 DIAGNOSIS — E875 Hyperkalemia: Secondary | ICD-10-CM | POA: Diagnosis not present

## 2015-10-28 DIAGNOSIS — I495 Sick sinus syndrome: Secondary | ICD-10-CM | POA: Insufficient documentation

## 2015-10-28 DIAGNOSIS — I429 Cardiomyopathy, unspecified: Secondary | ICD-10-CM | POA: Diagnosis not present

## 2015-10-28 DIAGNOSIS — I959 Hypotension, unspecified: Secondary | ICD-10-CM | POA: Diagnosis not present

## 2015-10-28 DIAGNOSIS — Z95810 Presence of automatic (implantable) cardiac defibrillator: Secondary | ICD-10-CM | POA: Diagnosis not present

## 2015-10-28 DIAGNOSIS — Z955 Presence of coronary angioplasty implant and graft: Secondary | ICD-10-CM | POA: Insufficient documentation

## 2015-10-28 DIAGNOSIS — Z8669 Personal history of other diseases of the nervous system and sense organs: Secondary | ICD-10-CM | POA: Insufficient documentation

## 2015-10-28 DIAGNOSIS — J309 Allergic rhinitis, unspecified: Secondary | ICD-10-CM | POA: Diagnosis not present

## 2015-10-28 DIAGNOSIS — I480 Paroxysmal atrial fibrillation: Secondary | ICD-10-CM | POA: Insufficient documentation

## 2015-10-28 DIAGNOSIS — R079 Chest pain, unspecified: Secondary | ICD-10-CM | POA: Diagnosis not present

## 2015-10-28 DIAGNOSIS — R197 Diarrhea, unspecified: Secondary | ICD-10-CM | POA: Diagnosis not present

## 2015-10-28 DIAGNOSIS — R42 Dizziness and giddiness: Secondary | ICD-10-CM | POA: Diagnosis not present

## 2015-10-28 DIAGNOSIS — N184 Chronic kidney disease, stage 4 (severe): Secondary | ICD-10-CM | POA: Diagnosis not present

## 2015-10-28 DIAGNOSIS — Z951 Presence of aortocoronary bypass graft: Secondary | ICD-10-CM | POA: Insufficient documentation

## 2015-10-28 DIAGNOSIS — K219 Gastro-esophageal reflux disease without esophagitis: Secondary | ICD-10-CM | POA: Insufficient documentation

## 2015-10-28 DIAGNOSIS — I5022 Chronic systolic (congestive) heart failure: Secondary | ICD-10-CM | POA: Insufficient documentation

## 2015-10-28 DIAGNOSIS — A084 Viral intestinal infection, unspecified: Secondary | ICD-10-CM | POA: Insufficient documentation

## 2015-10-28 DIAGNOSIS — R0602 Shortness of breath: Secondary | ICD-10-CM | POA: Diagnosis not present

## 2015-10-28 DIAGNOSIS — Z8249 Family history of ischemic heart disease and other diseases of the circulatory system: Secondary | ICD-10-CM | POA: Diagnosis not present

## 2015-10-28 DIAGNOSIS — I493 Ventricular premature depolarization: Secondary | ICD-10-CM | POA: Diagnosis not present

## 2015-10-28 DIAGNOSIS — N400 Enlarged prostate without lower urinary tract symptoms: Secondary | ICD-10-CM | POA: Insufficient documentation

## 2015-10-28 DIAGNOSIS — Z79899 Other long term (current) drug therapy: Secondary | ICD-10-CM | POA: Insufficient documentation

## 2015-10-28 HISTORY — DX: Type 2 diabetes mellitus without complications: E11.9

## 2015-10-28 HISTORY — DX: Basal cell carcinoma of skin, unspecified: C44.91

## 2015-10-28 HISTORY — DX: Hypertensive heart disease without heart failure: I11.9

## 2015-10-28 HISTORY — DX: Chronic kidney disease, stage 4 (severe): N18.4

## 2015-10-28 LAB — CBC WITH DIFFERENTIAL/PLATELET
BASOS ABS: 0 10*3/uL (ref 0–0.1)
BASOS PCT: 1 %
EOS ABS: 0.1 10*3/uL (ref 0–0.7)
EOS PCT: 1 %
HCT: 39.7 % — ABNORMAL LOW (ref 40.0–52.0)
Hemoglobin: 13 g/dL (ref 13.0–18.0)
Lymphocytes Relative: 3 %
Lymphs Abs: 0.2 10*3/uL — ABNORMAL LOW (ref 1.0–3.6)
MCH: 30 pg (ref 26.0–34.0)
MCHC: 32.7 g/dL (ref 32.0–36.0)
MCV: 91.8 fL (ref 80.0–100.0)
MONO ABS: 0.2 10*3/uL (ref 0.2–1.0)
Monocytes Relative: 3 %
Neutro Abs: 6.9 10*3/uL — ABNORMAL HIGH (ref 1.4–6.5)
Neutrophils Relative %: 92 %
PLATELETS: 176 10*3/uL (ref 150–440)
RBC: 4.33 MIL/uL — ABNORMAL LOW (ref 4.40–5.90)
RDW: 14.1 % (ref 11.5–14.5)
WBC: 7.5 10*3/uL (ref 3.8–10.6)

## 2015-10-28 LAB — BASIC METABOLIC PANEL
Anion gap: 4 — ABNORMAL LOW (ref 5–15)
BUN: 55 mg/dL — AB (ref 6–20)
CO2: 33 mmol/L — ABNORMAL HIGH (ref 22–32)
CREATININE: 2.67 mg/dL — AB (ref 0.61–1.24)
Calcium: 8.8 mg/dL — ABNORMAL LOW (ref 8.9–10.3)
Chloride: 104 mmol/L (ref 101–111)
GFR, EST AFRICAN AMERICAN: 25 mL/min — AB (ref >60)
GFR, EST NON AFRICAN AMERICAN: 21 mL/min — AB (ref >60)
Glucose, Bld: 190 mg/dL — ABNORMAL HIGH (ref 65–99)
Potassium: 4.3 mmol/L (ref 3.5–5.1)
SODIUM: 141 mmol/L (ref 135–145)

## 2015-10-28 LAB — URINALYSIS COMPLETE WITH MICROSCOPIC (ARMC ONLY)
BILIRUBIN URINE: NEGATIVE
Glucose, UA: 150 mg/dL — AB
Ketones, ur: NEGATIVE mg/dL
Nitrite: NEGATIVE
PH: 5 (ref 5.0–8.0)
Protein, ur: NEGATIVE mg/dL
Specific Gravity, Urine: 1.017 (ref 1.005–1.030)
Squamous Epithelial / LPF: NONE SEEN

## 2015-10-28 LAB — GLUCOSE, CAPILLARY
GLUCOSE-CAPILLARY: 135 mg/dL — AB (ref 65–99)
Glucose-Capillary: 149 mg/dL — ABNORMAL HIGH (ref 65–99)
Glucose-Capillary: 103 mg/dL — ABNORMAL HIGH (ref 65–99)

## 2015-10-28 LAB — TROPONIN I: Troponin I: <0.03 ng/mL (ref <0.031)

## 2015-10-28 LAB — C DIFFICILE QUICK SCREEN W PCR REFLEX
C DIFFICILE (CDIFF) INTERP: NEGATIVE
C DIFFICILE (CDIFF) TOXIN: NEGATIVE
C Diff antigen: NEGATIVE

## 2015-10-28 LAB — MAGNESIUM: MAGNESIUM: 2.4 mg/dL (ref 1.7–2.4)

## 2015-10-28 LAB — PHOSPHORUS: PHOSPHORUS: 3.1 mg/dL (ref 2.5–4.6)

## 2015-10-28 LAB — OCCULT BLOOD X 1 CARD TO LAB, STOOL: Fecal Occult Bld: NEGATIVE

## 2015-10-28 MED ORDER — SODIUM CHLORIDE 0.9% FLUSH
3.0000 mL | Freq: Two times a day (BID) | INTRAVENOUS | Status: DC
  Administered 2015-10-28 – 2015-10-29 (×3): 3 mL via INTRAVENOUS

## 2015-10-28 MED ORDER — ACETAMINOPHEN 650 MG RE SUPP: 650.0000 mg | Freq: Four times a day (QID) | RECTAL | Status: DC | PRN

## 2015-10-28 MED ORDER — SODIUM CHLORIDE 0.9% FLUSH: 3.0000 mL | INTRAVENOUS | Status: DC | PRN

## 2015-10-28 MED ORDER — FINASTERIDE 5 MG PO TABS
5.0000 mg | ORAL_TABLET | Freq: Every day | ORAL | Status: DC
  Administered 2015-10-29: 5 mg via ORAL
  Filled 2015-10-28: qty 1

## 2015-10-28 MED ORDER — ASPIRIN 81 MG PO CHEW: 81.0000 mg | CHEWABLE_TABLET | Freq: Every day | ORAL | Status: DC

## 2015-10-28 MED ORDER — SODIUM CHLORIDE 0.9 % IV SOLN: 250.0000 mL | INTRAVENOUS | Status: DC | PRN

## 2015-10-28 MED ORDER — INSULIN ASPART 100 UNIT/ML ~~LOC~~ SOLN
0.0000 [IU] | Freq: Three times a day (TID) | SUBCUTANEOUS | Status: DC
  Administered 2015-10-28 (×2): 2 [IU] via SUBCUTANEOUS
  Filled 2015-10-28 (×2): qty 2

## 2015-10-28 MED ORDER — IPRATROPIUM-ALBUTEROL 0.5-2.5 (3) MG/3ML IN SOLN: 3.0000 mL | RESPIRATORY_TRACT | Status: DC | PRN

## 2015-10-28 MED ORDER — ACETAMINOPHEN 325 MG PO TABS: 650.0000 mg | ORAL_TABLET | Freq: Four times a day (QID) | ORAL | Status: DC | PRN

## 2015-10-28 MED ORDER — CALCITRIOL 0.25 MCG PO CAPS
0.5000 ug | ORAL_CAPSULE | Freq: Every day | ORAL | Status: DC
  Administered 2015-10-28 – 2015-10-29 (×2): 0.5 ug via ORAL
  Filled 2015-10-28 (×2): qty 2

## 2015-10-28 MED ORDER — TAMSULOSIN HCL 0.4 MG PO CAPS
0.4000 mg | ORAL_CAPSULE | Freq: Every day | ORAL | Status: DC
  Administered 2015-10-28: 0.4 mg via ORAL
  Filled 2015-10-28: qty 1

## 2015-10-28 MED ORDER — PANTOPRAZOLE SODIUM 40 MG PO TBEC
40.0000 mg | DELAYED_RELEASE_TABLET | Freq: Two times a day (BID) | ORAL | Status: DC
  Administered 2015-10-28 – 2015-10-29 (×3): 40 mg via ORAL
  Filled 2015-10-28 (×3): qty 1

## 2015-10-28 MED ORDER — INSULIN ASPART PROT & ASPART (70-30 MIX) 100 UNIT/ML ~~LOC~~ SUSP
6.0000 [IU] | Freq: Two times a day (BID) | SUBCUTANEOUS | Status: DC
  Administered 2015-10-28 – 2015-10-29 (×2): 6 [IU] via SUBCUTANEOUS
  Filled 2015-10-28 (×2): qty 6

## 2015-10-28 MED ORDER — ONDANSETRON HCL 4 MG PO TABS: 4.0000 mg | ORAL_TABLET | Freq: Four times a day (QID) | ORAL | Status: DC | PRN

## 2015-10-28 MED ORDER — SODIUM CHLORIDE 0.9% FLUSH: 3.0000 mL | Freq: Two times a day (BID) | INTRAVENOUS | Status: DC

## 2015-10-28 MED ORDER — ZOLPIDEM TARTRATE 5 MG PO TABS
5.0000 mg | ORAL_TABLET | Freq: Every evening | ORAL | Status: DC | PRN
Start: 2015-10-28 — End: 2015-10-29
  Administered 2015-10-28: 5 mg via ORAL
  Filled 2015-10-28: qty 1

## 2015-10-28 MED ORDER — AMIODARONE HCL 200 MG PO TABS
200.0000 mg | ORAL_TABLET | Freq: Every day | ORAL | Status: DC
  Administered 2015-10-28 – 2015-10-29 (×2): 200 mg via ORAL
  Filled 2015-10-28 (×2): qty 1

## 2015-10-28 MED ORDER — INSULIN ASPART 100 UNIT/ML ~~LOC~~ SOLN: 0.0000 [IU] | Freq: Every day | SUBCUTANEOUS | Status: DC

## 2015-10-28 MED ORDER — ROSUVASTATIN CALCIUM 20 MG PO TABS
20.0000 mg | ORAL_TABLET | Freq: Every day | ORAL | Status: DC
  Administered 2015-10-28: 20 mg via ORAL
  Filled 2015-10-28: qty 1

## 2015-10-28 MED ORDER — ONDANSETRON HCL 4 MG/2ML IJ SOLN: 4.0000 mg | Freq: Four times a day (QID) | INTRAMUSCULAR | Status: DC | PRN

## 2015-10-28 MED ORDER — APIXABAN 5 MG PO TABS
5.0000 mg | ORAL_TABLET | Freq: Two times a day (BID) | ORAL | Status: DC
  Administered 2015-10-28 – 2015-10-29 (×3): 5 mg via ORAL
  Filled 2015-10-28 (×3): qty 1

## 2015-10-28 NOTE — Progress Notes (Signed)
Pt alert and oriented x4, no complaints of pain or discomfort.  Bed in low position, call bell within reach.  Bed alarms on and functioning.  Assessment done and charted.  Will continue to monitor and do hourly rounding throughout the shift.  Skin assessment done and recorded.  Skin is very dry and scaly.  Pt arms noted with bruising that the pt states comes from being on blood thinners.  Pt  Wife at the bedside.  Pt has a pacemaker and is noted on the tele reading.  Tele box 19.  #18 in the [place] .  C-Diff specimen noted to be negative

## 2015-10-28 NOTE — ED Provider Notes (Signed)
[facility] Emergency Department Provider Note  ____________________________________________  Time seen: 8:10 AM on arrival by EMS  I have reviewed the triage vital signs and the nursing notes.   HISTORY  Chief Complaint Chest Pain    HPI Marco Whale Sr. is a 80 y.o. male who reports onset of central chest pain that started at 11 PM last night. The pain is been constant since then, gradually worsening. Feels like dull aching. Nonradiating. he has had multiple episodes of vomiting and feels mildly short of breath. No diaphoresis.  EMS gave 324 of aspirin as well as 1 nitroglycerin spray. They report that with the nitroglycerin spray, blood pressure decreased from 120/80 to 90/70.  Patient reports this does not feel like her prior heart attack necessarily, but he also has a history of silent MI. He ate pizza and salad for dinner last night, which his wife also ate who has no new symptoms.   Past Medical History  Diagnosis Date  . Cardiomyopathy -mixed     EF 30-35% -- 7/13; Pulm HTN and modest RV dysfunction  . Coronary artery disease      The patient had stent to the right coronary artery in 1999.  The patient had unstable angina.  Non-Q-wave myocardial infarction in June 2006.  Coronary artery bypass graft surgery on February 01, 2005, with  mitral valve annuloplasty with   . Sick sinus syndrome ([place])   . Mitral regurgitation--s/p repair     placement of a 28-mm Edwards   ETlogix ring.     [name]      Generator replacement 10/13  . Diabetes mellitus   . Hypertension   . Hyperlipidemia   . BPH (benign prostatic hypertrophy)   . Renal insufficiency Gd 4   . GERD (gastroesophageal reflux disease)   . Chronic systolic heart failure (HCC)     New York Heart Association class II-III   . Obesity   . DVT (deep venous thrombosis) ([place])   . Anemia   . Sleep apnea     Not tolerant of CPAP  . Skin cancer 2013    [name] x 2 back  and left neck  . AAA (abdominal aortic aneurysm)repaired 2008    repair  . Atrial fibrillation/Flutter     newly identified 2014  . PVC (premature ventricular contraction)     s/p RFCA JAllred 2011  . Carotid artery occlusion   . History of shingles      Patient Active Problem List   Diagnosis Date Noted  . Absolute anemia 01/28/2015  . Atherosclerosis of coronary artery 01/28/2015  . Barrett's esophagus 01/28/2015  . Basal cell carcinoma of face 01/28/2015  . Benign fibroma of prostate 01/28/2015  . CCF (congestive cardiac failure) ([place]) 01/28/2015  . Diabetes ([place]) 01/28/2015  . Gastro-esophageal reflux disease without esophagitis 01/28/2015  . Irritable colon 01/28/2015  . Cannot sleep 01/28/2015  . Pleural cavity effusion 01/28/2015  . Apnea, sleep 01/28/2015  . Hematuria 12/21/2014  . Left hand weakness 09/23/2013  . Essential hypertension 06/23/2013  . Chronic systolic heart failure ([place]) 06/10/2013  . Hypotension, postural 06/10/2013  . Hyperkalemia 06/10/2013  . Adjustment of cardiac pacemaker 06/10/2013  . Atrial fibrillation and flutter ([place]) 10/22/2012  . Pacemaker-Medtronic 06/19/2012  . Pulmonary hypertension ([place]) 03/18/2012  . Occlusion and stenosis of carotid artery without mention of cerebral infarction 02/08/2012  . Morbid obesity ([place]) 08/05/2010  . Sinoatrial node dysfunction (HCC) 06/02/2010  . Stage 4 chronic renal  impairment associated with type 2 diabetes mellitus ([place]) 08/09/2009  . AAA 04/21/2009  . Ischemic cardiomyopathy  s/p CABG 11/23/2008  . MITRAL REGURGITATION 11/22/2008  . CAD (coronary artery disease) 11/22/2008  . Hyperlipidemia 03/02/2008  . ALLERGIC RHINITIS 03/02/2008  . OSA (obstructive sleep apnea) 03/02/2008     Past Surgical History  Procedure Laterality Date  . Coronary artery bypass graft      CABG with a  LIMA to the LAD, SVG to circumflex, SVG to posterolateral June2006.  . Mitral valve repair  2006     mitral valve  repair with a #28 Edwards logic ring  . Pacemaker placement    . Abdominal aortic aneurysm repair       May 2008,   . Cardiac catheterization  2006    @ [facility]  . Insert / replace / remove pacemaker  2006    Dr. [name]  . Skin cancer excision       x 6  . Cataract extraction      right  . Skin biopsy      left wrist; right elbow  . Transesophageal echocardiogram    . Skin cancer excision    . Permanent pacemaker generator change N/A 06/26/2012    Procedure: PERMANENT PACEMAKER GENERATOR CHANGE;  Surgeon: Deboraha Sprang, MD;  Location: [facility] CATH LAB;  Service: Cardiovascular;  Laterality: N/A;  . Vasectomy       Current Outpatient Rx  Name  Route  Sig  Dispense  Refill  . ACCU-CHEK FASTCLIX LANCETS MISC      TEST twice a day if needed   102 each   12   . ACCU-CHEK SMARTVIEW test strip      TEST twice a day   100 each   12     Dx 250.00   . amiodarone (PACERONE) 200 MG tablet      take 1 tablet by mouth once daily   30 tablet   3   . apixaban (ELIQUIS) 5 MG TABS tablet   Oral   Take 5 mg by mouth 2 (two) times daily.          . BD INSULIN SYRINGE ULTRAFINE 31G X 5/16" 0.3 ML MISC      use twice a day   100 each   12   . carvedilol (COREG) 3.125 MG tablet      take 1 tablet by mouth every morning   90 tablet   3   . carvedilol (COREG) 6.25 MG tablet   Oral   Take 6.25 mg by mouth every evening.         . cholecalciferol (VITAMIN D) 1000 UNITS tablet   Oral   Take 1,000 Units by mouth daily.         [name] docusate sodium (COLACE) 100 MG capsule   Oral   Take 100 mg by mouth 2 (two) times daily.           . finasteride (PROSCAR) 5 MG tablet   Oral   Take 5 mg by mouth daily.         . furosemide (LASIX) 40 MG tablet   Oral   Take 1 tablet (40 mg total) by mouth daily.   30 tablet      . insulin lispro protamine-lispro (HUMALOG 75/25 MIX) (75-25) 100 UNIT/ML SUSP injection   Subcutaneous   Inject into the skin.         [name] losartan  (COZAAR) 25 MG tablet  Oral   Take 25 mg by mouth 2 (two) times daily.       0   . Multiple Vitamins-Minerals (MULTI-VITAMIN GUMMIES) CHEW   Oral   Chew by mouth daily.         . NON FORMULARY      1.5 liters         . omeprazole (PRILOSEC) 20 MG capsule      take 1 capsule by mouth once daily if needed   30 capsule   12   . rosuvastatin (CRESTOR) 20 MG tablet      take 1 tablet by mouth at bedtime   90 tablet   3   . Tamsulosin HCl (FLOMAX) 0.4 MG CAPS   Oral   Take 0.4 mg by mouth at bedtime. 1 po daily            Allergies Shellfish allergy   Family History  Problem Relation Age of Onset  . Heart attack Mother 68  . Diabetes Sister   . Hypertension Sister   . Heart attack Sister   . Heart attack Brother 2    3 Brothers deceased from [place] in 18's or 76's  . Heart disease Brother     Heart Disease before age 25  . Heart attack Father 68  . Diabetes Father     Amputation: bilateral feet  . Heart disease Father   . Heart attack Sister   . Heart attack Sister   . Heart attack Brother 21  . Heart attack Brother 2  . Diabetes Son     Social History Social History  Substance Use Topics  . Smoking status: Former Smoker -- 1.50 packs/day for 34 years    Types: Cigarettes    Quit date: 08/28/1987  . Smokeless tobacco: Never Used  . Alcohol Use: 0.6 oz/week    1 Cans of beer per week     Comment: Occasionally but none since his surgery in June    Review of Systems  Constitutional:   No fever or chills. No weight changes Eyes:   No blurry vision or double vision.  ENT:   No sore throat.  Cardiovascular:   Positive as above chest pain. Respiratory:   Positive shortness of breath, without cough. Gastrointestinal:   Negative for abdominal pain, positive for multiple episodes of vomiting.[name]  No BRBPR or melena. Genitourinary:   Negative for dysuria or difficulty urinating. Musculoskeletal:   Negative for back pain. No joint swelling or  pain. Skin:   Negative for rash. Neurological:   Negative for headaches, focal weakness or numbness. Psychiatric:  No anxiety or depression.   Endocrine:  No changes in energy or sleep difficulty.  10-point ROS otherwise negative.  ____________________________________________   PHYSICAL EXAM:  VITAL SIGNS: ED Triage Vitals  Enc Vitals Group     BP 10/28/15 0834 97/59 mmHg     Pulse Rate 10/28/15 0834 81     Resp 10/28/15 0834 24     Temp 10/28/15 0834 98 F (36.7 C)     Temp src --      SpO2 10/28/15 0834 95 %     Weight 10/28/15 0834 260 lb (117.935 kg)     Height 10/28/15 0834 6' (1.829 m)     Head Cir --      Peak Flow --      Pain Score 10/28/15 0835 7     Pain Loc --      Pain Edu? --  Excl. in [place]? --     Vital signs reviewed, nursing assessments reviewed.   Constitutional:   Alert and oriented. Uncomfortable. Eyes:   No scleral icterus. No conjunctival pallor. PERRL. EOMI ENT   Head:   Normocephalic and atraumatic.   Nose:   No congestion/rhinnorhea. No septal hematoma   Mouth/Throat:   MMM, no pharyngeal erythema. No peritonsillar mass.    Neck:   No stridor. No SubQ emphysema. No meningismus. Hematological/Lymphatic/Immunilogical:   No cervical lymphadenopathy. Cardiovascular:   RRR. Symmetric bilateral radial and DP pulses.  No murmurs.  Respiratory:   Normal respiratory effort without tachypnea nor retractions. Breath sounds are clear and equal bilaterally. No wheezes/rales/rhonchi. Gastrointestinal:   Soft and nontender. Non distended. There is no CVA tenderness.  No rebound, rigidity, or guarding. Genitourinary:   deferred Musculoskeletal:   Nontender with normal range of motion in all extremities. No joint effusions.  No lower extremity tenderness.  No edema. Chest wall nontender Neurologic:   Normal speech and language.  CN 2-10 normal. Motor grossly intact. No gross focal neurologic deficits are appreciated.  Skin:    Skin is  warm, dry and intact. No rash noted.  No petechiae, purpura, or bullae. Psychiatric:   Mood and affect are normal. ____________________________________________    LABS (pertinent positives/negatives) (all labs ordered are listed, but only abnormal results are displayed) Labs Reviewed  BASIC METABOLIC PANEL  TROPONIN I  CBC WITH DIFFERENTIAL/PLATELET  MAGNESIUM  PHOSPHORUS   ____________________________________________   EKG  Multiple prehospital EKGs performed by EMS show a paced rhythm, left bundle branch block, no Sgarbossa criteria.  ED EKG interpreted by me Atrial paced rhythm, rate of 82, normal axis, left bundle branch block. Normal ST segments and T waves. QTc is borderline prolonged at 500 ms. Additional EKG performed with right-sided leads shows no evidence of acute ischemic changes on the right side, no right-sided STEMI.  ____________________________________________    RADIOLOGY  Chest x-ray unremarkable.  ____________________________________________   PROCEDURES   ____________________________________________   INITIAL IMPRESSION / ASSESSMENT AND PLAN / ED COURSE  Pertinent labs & imaging results that were available during my care of the patient were reviewed by me and considered in my medical decision making (see chart for details).  Patient with severe underlying CAD and CHF and multiple cardiac risk factors presents with worsening chest pain. Per EMS was unable to tolerate nitrates due to a significant decrease in blood pressure. We will continue monitoring in the emergency department while checking labs. We'll plan on hospitalization for further monitoring and evaluation. Patient's cardiologist is Dr. Rockey Situ.   ----------------------------------------- 10:36 AM on 10/28/2015 -----------------------------------------  Workup essentially negative, chronic renal insufficiency. Pain is improved but still persistent. Blood pressure is still soft, unable  to give nitrates. We'll give a small fluid challenge. He is also having diarrhea that we will test for C. difficile. Her current hospital census is very high and it's unclear if we have telemetry beds available for hospitalization in this facility. Will follow-up with bed coordinator to determine where we can plan to have the patient undergo further cardiac evaluation.      ____________________________________________   FINAL CLINICAL IMPRESSION(S) / ED DIAGNOSES  Final diagnoses:  Precordial chest pain      Carrie Mew, MD 10/28/15 1559

## 2015-10-28 NOTE — Care Management Obs Status (Signed)
[facility] NOTIFICATION   Patient Details  Name: Marco Sass Sr. MRN: [id number] Date of Birth: [birth year]   Medicare Observation Status Notification Given:  Yes    Beau Fanny, RN 10/28/2015, 11:24 AM

## 2015-10-28 NOTE — Consult Note (Signed)
Cardiology Consult    Patient ID: Marco [name] [facility] Sr. MRN: [id number], DOB/AGE: 78-Jan-1937   Admit date: 10/28/2015 Date of Consult: 10/28/2015  Primary Physician: Wilhemena Durie, MD Primary Cardiologist: Johnny Bridge, MD /S. [name], MD  Requesting Provider: Chauncey Cruel. Sud[place]  Patient Profile    80 y/o ? with a h/o CAD s/p CABG, MR s/p MVR, PAF/Flutter, mixed ICM/NICM, syst CHF, AAA s/p repair, Carotid Dzs s/p R CEA, PVC's s/p RFCA, HTN, HL, and DM, who presented to the ED on 3/2 secondary to c/p.  Past Medical History   Past Medical History  Diagnosis Date  . Mixed Ischemic and Non-ischemic Cardiomyopathy     a. 02/2012 Echo: EF 30-35%, Pulm HTN, and modest RV dysfunction;  b. 10/2012 TEE: EF 25-30%, mild conc LVH, diff HK, mod RV dysfxn, mod dil RA/LA, mild MR, no MS, nl MVR, mod TR, Ao sclerosis.  . Coronary artery disease     a. 1999 PCI RCA;  b. 01/2005 NSTEMI: CABG x 3 (LIMA->LAD, VG->OM, VG->LPL) @ time of MVR.  [name] Sick sinus syndrome ([place])     a. 01/2005 s/p PPM 2/2 heart block following CABG;  b. 05/2012 Gen Change: MDT Adapta ADDRL1 DC PPM, ser # [id number].  . Mitral regurgitation--s/p repair     a. 01/2005 MVR: 28-mm Edwards ET Logix ring annuloplasty; b. 10/2012 TEE: normal fxning MV ring with mild MR, no MS.  . Type II diabetes mellitus ([place])   . Hypertensive heart disease   . Hyperlipidemia   . BPH (benign prostatic hypertrophy)   . CKD (chronic kidney disease), stage IV ([place])   . GERD (gastroesophageal reflux disease)   . Chronic systolic heart failure ([place])     a. 02/2012 Echo: EF 30-35%, Pulm HTN, and modest RV dysfunction;  b. 10/2012 TEE: EF 25-30%, mild conc LVH, diff HK, mod RV dysfxn, mod dil RA/LA, mild MR, no MS, nl MVR, mod TR, Ao sclerosis.  . Obesity   . DVT (deep venous thrombosis) ([place])   . Anemia   . Sleep apnea     a. Intolerant of CPAP  . Basal cell carcinoma     a. 2013 x 2 back and left neck  . AAA (abdominal aortic aneurysm)repaired     a. 12/2006  s/p repair of 6cm infrarenal AAA with 50mm Dacron graft.  . Atrial fibrillation/Flutter     a. Dx 2014;  b. 10/2012 s/p TEE/DCCV;  c. On amio and Eliquis (CHA2DS2VASc = 6).  [name] PVC (premature ventricular contraction)     a. 02/2009 s/p RFCA ([place]).  . Carotid artery occlusion     a. 11/2008 s/p R CEA.  [name] History of shingles     Past Surgical History  Procedure Laterality Date  . Coronary artery bypass graft      CABG with a  LIMA to the LAD, SVG to circumflex, SVG to posterolateral June2006.  . Mitral valve repair  2006     mitral valve repair with a #28 Edwards logic ring  . Pacemaker placement    . Abdominal aortic aneurysm repair       May 2008,   . Cardiac catheterization  2006    @ [place]  . Insert / replace / remove pacemaker  2006    Dr. [name]  . Skin cancer excision       x 6  . Cataract extraction      right  . Skin biopsy      left wrist;  right elbow  . Transesophageal echocardiogram    . Skin cancer excision    . Permanent pacemaker generator change N/A 06/26/2012    Procedure: PERMANENT PACEMAKER GENERATOR CHANGE;  Surgeon: Deboraha Sprang, MD;  Location: [facility] CATH LAB;  Service: Cardiovascular;  Laterality: N/A;  . Vasectomy       Allergies  Allergies  Allergen Reactions  . Shellfish Allergy Anaphylaxis    History of Present Illness    80 y/o ? with the above complex PMH.  He has a h/o CAD and mitral valve dzs and is s/p CABG x 3 with MV ring in 01/2005.  Post-op course was complicated by heart block and he subsequently underwent PPM placement.  In 2010, he underwent catheter ablation for PVC's and in 2014, he underwent TEE and DCCV for PAF.  He has since been maintained on amio and eliquis.  He has some degree of chronic DOE and since 01/2015, his weight climbed to a peak of 268 before dropping back down to 260 on 2/7.  He has been trying to lose wt but also thinks some of his wt gain has been related to fluid retention.  Since his last clinic visit on 2/7, his wt  has been stable @ home.  Beginning at about 11PM on 3/1, he developed nausea and vomiting. He had multiple episodes and also had an episode of diarrhea.  By about 4 AM, he also noted mild chest pressure w/o dyspnea.  He continued to have n, v, and c/p the remainder of the night/early morning, and finally presented to the [facility] ED @ roughly 8:30 this morning.  Shortly after arrival, c/p resolved w/o intervention.  N/V has improved some, though he has been having more diarrhea.  ECG is non-acute.  Trop neg so far.  Currently c/p free.  We've been asked to eval.  Inpatient Medications    . amiodarone  200 mg Oral Daily  . apixaban  5 mg Oral BID  . [START ON 10/29/2015] aspirin  81 mg Oral Daily  . calcitRIOL  0.5 mcg Oral Daily  . [START ON 10/29/2015] finasteride  5 mg Oral Daily  . insulin aspart  0-15 Units Subcutaneous TID WC  . insulin aspart  0-5 Units Subcutaneous QHS  . insulin aspart protamine- aspart  6 Units Subcutaneous BID WC  . pantoprazole  40 mg Oral BID  . rosuvastatin  20 mg Oral QHS  . sodium chloride flush  3 mL Intravenous Q12H  . sodium chloride flush  3 mL Intravenous Q12H  . tamsulosin  0.4 mg Oral QHS    Family History    Family History  Problem Relation Age of Onset  . Heart attack Mother 64  . Diabetes Sister   . Hypertension Sister   . Heart attack Sister   . Heart attack Brother 31    3 Brothers deceased from [place] in 53's or 34's  . Heart disease Brother     Heart Disease before age 2  . Heart attack Father 1  . Diabetes Father     Amputation: bilateral feet  . Heart disease Father   . Heart attack Sister   . Heart attack Sister   . Heart attack Brother 85  . Heart attack Brother 83  . Diabetes Son     Social History    Social History   Social History  . Marital Status: Married    Spouse Name: N/A  . Number of Children: N/A  . Years of  Education: N/A   Occupational History  . Retired     Engineer, structural in [place]  .     Social  History Main Topics  . Smoking status: Former Smoker -- 1.50 packs/day for 34 years    Types: Cigarettes    Quit date: 08/28/1987  . Smokeless tobacco: Never Used  . Alcohol Use: 0.6 oz/week    1 Cans of beer per week     Comment: Occasionally but none since his surgery in June  . Drug Use: No  . Sexual Activity: Not on file   Other Topics Concern  . Not on file   Social History Narrative     Review of Systems    General:  No chills, fever, night sweats or weight changes.  Cardiovascular:  +++ chest pain, +++ chronic dyspnea on exertion, no edema, orthopnea, palpitations, paroxysmal nocturnal dyspnea. Dermatological: No rash, lesions/masses Respiratory: No cough, +++ chronic dyspnea Urologic: No hematuria, dysuria Abdominal:   +++ nausea, vomiting, diarrhea since last night.  No bright red blood per rectum, melena, or hematemesis Neurologic:  No visual changes, wkns, changes in mental status. All other systems reviewed and are otherwise negative except as noted above.  Physical Exam    Blood pressure 111/52, pulse 87, temperature 98.5 F (36.9 C), temperature source Oral, resp. rate 22, height 6' (1.829 m), weight 260 lb (117.935 kg), SpO2 92 %.  General: Pleasant, NAD Psych: Normal affect. Neuro: Alert and oriented X 3. Moves all extremities spontaneously. HEENT: Normal  Neck: Supple without bruits or JVD. Lungs:  Resp regular and unlabored, diminished breath sounds @ bases, otw CTA. Heart: RRR no s3, s4, or murmurs. Abdomen: Obese, soft, non-tender, non-distended, BS + x 4.  Extremities: No clubbing, cyanosis or edema. DP/PT/Radials 2+ and equal bilaterally.  Labs     Recent Labs  10/28/15 0836  TROPONINI <0.03   Lab Results  Component Value Date   WBC 7.5 10/28/2015   HGB 13.0 10/28/2015   HCT 39.7* 10/28/2015   MCV 91.8 10/28/2015   PLT 176 10/28/2015     Recent Labs Lab 10/28/15 0836  NA 141  K 4.3  CL 104  CO2 33*  BUN 55*  CREATININE 2.67*    CALCIUM 8.8*  GLUCOSE 190*   Lab Results  Component Value Date   CHOL 145 04/09/2015   HDL 47 04/09/2015   LDLCALC 77 04/09/2015   TRIG 104 04/09/2015     Radiology Studies    Dg Chest Portable 1 View  10/28/2015  CLINICAL DATA:  Chest discomfort, substernal pain extending to the left side of chest, shortness of breath. EXAM: PORTABLE CHEST 1 VIEW COMPARISON:  Chest x-ray dated 03/14/2014 FINDINGS: Patient is status post median sternotomy, with sternotomy wires appearing intact and stable in alignment. Left chest wall pacemaker/AICD is stable in position. Cardiomegaly appears stable. Overall cardiomediastinal silhouette is unchanged in size or configuration. Mitral valve replacement hardware is stable in position. Dense opacity is again seen at the left lung base, compatible with previous report description of a chronic pleural thickening, possibly with overlying atelectasis or small adjacent chronic effusion. Chronic scarring/atelectasis again noted at the right lung base, with adjacent pleural thickening and/or small chronic pleural effusion. No new lung findings seen. No pneumothorax. IMPRESSION: Stable chest x-ray. No evidence of acute cardiopulmonary abnormality. Electronically Signed   By: Franki Cabot M.D.   On: 10/28/2015 09:49    ECG & Cardiac Imaging    A paced, 80, IVCD, no acute  st/t changes.  Assessment & Plan    1.  Nausea, Vomiting, Diarrhea: Pt presented to the ED on 3/2 with an 8 hr h/o n, v, d. Likely viral gastroenteritis.  IVF per IM - will have to watch volume status closely.  Currently feeling a little better.  2.  Midsternal chest pain/CAD:  After ~ 4 hrs on persistent n/v, pt developed midsternal chest pain, which he says was not like angina.  This was very mild and did not necessarily worsen with dry heaving or vomiting.  C/p resolved once nausea improved in ED.  ECG non-acute, trop neg so far.  Cont to cycle CE.  If CE remain negative, would not pursue further  ischemic eval @ this time.  As it's been nearly 11 years since his CABG and he hasn't had any ischemic eval in that time, we could consider outpt stress testing once he recovers from acute illness, understanding that it would be a two day study.  Cont asa, statin.  Plan to d/c ASA if trop remains nl as he is on eliquis.  3.  Chronic Systolic CHF/Mixed ICM/NICM:  Volume appears stable.  Wt has been stable @ home. Last documented EF was 25-30% by TEE in 2014.  Plan to resume home doses of  blocker, ARB, lasix as pressure stabilizes - currently soft in setting of probable dehydration/#1.  4.  CKD IV:  Creat relatively stable @ 2.67.  5.  DM II:  Per IM.  6. PAF:  In sinus. Cont amio/eliquis.  Signed, Murray Hodgkins, NP 10/28/2015, 1:05 PM

## 2015-10-28 NOTE — H&P (Signed)
[facility] NAME: Marco [name]    MR#:  [id number]  DATE OF BIRTH:  [birth year]  DATE OF ADMISSION:  10/28/2015  PRIMARY CARE PHYSICIAN: Wilhemena Durie, MD   REQUESTING/REFERRING PHYSICIAN: Dr. Joni Fears  CHIEF COMPLAINT:   Chief Complaint  Patient presents with  . Chest Pain    HISTORY OF PRESENT ILLNESS:  Marco [name]  is a 80 y.o. male with a known history of CAD, atrial fibrillation, chronic systolic CHF, AAA, AICD, insulin-dependent diabetes mellitus presents to the emergency room with nausea vomiting and chest tightness since yesterday night. Patient initially started with nausea and then started having multiple episodes of vomiting. No blood. His last episode of vomiting was at 4 AM but he continued to have chest tightness 2 he presented to the emergency room. This pain seems still have resolved after he had nitroglycerin spray by EMS. His blood pressure did drop from systolic of [id number] to 90 after the nitroglycerin spray. Now he is chest tightness free. He had one episode of diarrhea at night. And a large liquidy stool in the emergency room with which C. difficile test has been sent. No abdominal pain and no recent antibiotic use.  PAST MEDICAL HISTORY:   Past Medical History  Diagnosis Date  . Cardiomyopathy -mixed     EF 30-35% -- 7/13; Pulm HTN and modest RV dysfunction  . Coronary artery disease      The patient had stent to the right coronary artery in 1999.  The patient had unstable angina.  Non-Q-wave myocardial infarction in June 2006.  Coronary artery bypass graft surgery on February 01, 2005, with  mitral valve annuloplasty with   . Sick sinus syndrome ([place])   . Mitral regurgitation--s/p repair     placement of a 28-mm Edwards   ETlogix ring.     [name]      Generator replacement 10/13  . Diabetes mellitus   . Hypertension   . Hyperlipidemia   . BPH (benign prostatic hypertrophy)   .  Renal insufficiency Gd 4   . GERD (gastroesophageal reflux disease)   . Chronic systolic heart failure (HCC)     New York Heart Association class II-III   . Obesity   . DVT (deep venous thrombosis) ([place])   . Anemia   . Sleep apnea     Not tolerant of CPAP  . Skin cancer 2013    [name] x 2 back and left neck  . AAA (abdominal aortic aneurysm)repaired 2008    repair  . Atrial fibrillation/Flutter     newly identified 2014  . PVC (premature ventricular contraction)     s/p RFCA JAllred 2011  . Carotid artery occlusion   . History of shingles     PAST SURGICAL HISTORY:   Past Surgical History  Procedure Laterality Date  . Coronary artery bypass graft      CABG with a  LIMA to the LAD, SVG to circumflex, SVG to posterolateral June2006.  . Mitral valve repair  2006     mitral valve repair with a #28 Edwards logic ring  . Pacemaker placement    . Abdominal aortic aneurysm repair       May 2008,   . Cardiac catheterization  2006    @ [facility]  . Insert / replace / remove pacemaker  2006    Dr. [name]  . Skin cancer excision       x 6  .  Cataract extraction      right  . Skin biopsy      left wrist; right elbow  . Transesophageal echocardiogram    . Skin cancer excision    . Permanent pacemaker generator change N/A 06/26/2012    Procedure: PERMANENT PACEMAKER GENERATOR CHANGE;  Surgeon: Deboraha Sprang, MD;  Location: [facility] CATH LAB;  Service: Cardiovascular;  Laterality: N/A;  . Vasectomy      SOCIAL HISTORY:   Social History  Substance Use Topics  . Smoking status: Former Smoker -- 1.50 packs/day for 34 years    Types: Cigarettes    Quit date: 08/28/1987  . Smokeless tobacco: Never Used  . Alcohol Use: 0.6 oz/week    1 Cans of beer per week     Comment: Occasionally but none since his surgery in June    FAMILY HISTORY:   Family History  Problem Relation Age of Onset  . Heart attack Mother 65  . Diabetes Sister   . Hypertension Sister   . Heart attack  Sister   . Heart attack Brother 74    3 Brothers deceased from [place] in 48's or 51's  . Heart disease Brother     Heart Disease before age 56  . Heart attack Father 59  . Diabetes Father     Amputation: bilateral feet  . Heart disease Father   . Heart attack Sister   . Heart attack Sister   . Heart attack Brother 54  . Heart attack Brother 74  . Diabetes Son     DRUG ALLERGIES:   Allergies  Allergen Reactions  . Shellfish Allergy Anaphylaxis    REVIEW OF SYSTEMS:   Review of Systems  Constitutional: Positive for malaise/fatigue. Negative for fever, chills and weight loss.  HENT: Negative for hearing loss and nosebleeds.   Eyes: Negative for blurred vision, double vision and pain.  Respiratory: Positive for shortness of breath. Negative for cough, hemoptysis, sputum production and wheezing.   Cardiovascular: Positive for chest pain and orthopnea. Negative for palpitations and leg swelling.  Gastrointestinal: Negative for nausea, vomiting, abdominal pain, diarrhea and constipation.  Genitourinary: Negative for dysuria and hematuria.  Musculoskeletal: Negative for myalgias, back pain and falls.  Skin: Negative for rash.  Neurological: Positive for dizziness. Negative for tremors, sensory change, speech change, focal weakness, seizures and headaches.  Endo/Heme/Allergies: Does not bruise/bleed easily.  Psychiatric/Behavioral: Negative for depression and memory loss. The patient is not nervous/anxious.     MEDICATIONS AT HOME:   Prior to Admission medications   Medication Sig Start Date End Date Taking? Authorizing Provider  amiodarone (PACERONE) 200 MG tablet Take 200 mg by mouth daily.   Yes Historical Provider, MD  apixaban (ELIQUIS) 5 MG TABS tablet Take 5 mg by mouth 2 (two) times daily.    Yes Historical Provider, MD  calcitRIOL (ROCALTROL) 0.5 MCG capsule Take 0.5 mcg by mouth daily.   Yes Historical Provider, MD  carvedilol (COREG) 3.125 MG tablet Take 3.125 mg by mouth  daily.   Yes Historical Provider, MD  carvedilol (COREG) 6.25 MG tablet Take 6.25 mg by mouth every evening.   Yes Historical Provider, MD  cholecalciferol (VITAMIN D) 1000 UNITS tablet Take 1,000 Units by mouth daily.   Yes Historical Provider, MD  docusate sodium (COLACE) 100 MG capsule Take 100 mg by mouth 2 (two) times daily.     Yes Historical Provider, MD  finasteride (PROSCAR) 5 MG tablet Take 5 mg by mouth daily.   Yes Historical  Provider, MD  furosemide (LASIX) 40 MG tablet Take 40 mg by mouth 2 (two) times daily.   Yes Historical Provider, MD  insulin lispro protamine-lispro (HUMALOG 75/25 MIX) (75-25) 100 UNIT/ML SUSP injection Inject 15-16 Units into the skin 2 (two) times daily.  11/24/14  Yes Historical Provider, MD  losartan (COZAAR) 25 MG tablet Take 25 mg by mouth daily.  09/14/14  Yes Historical Provider, MD  omeprazole (PRILOSEC) 20 MG capsule Take 20 mg by mouth 3 (three) times a week.   Yes Historical Provider, MD  rosuvastatin (CRESTOR) 20 MG tablet Take 20 mg by mouth daily.   Yes Historical Provider, MD  Tamsulosin HCl (FLOMAX) 0.4 MG CAPS Take 0.4 mg by mouth at bedtime. 1 po daily   Yes Historical Provider, MD  amiodarone (PACERONE) 200 MG tablet take 1 tablet by mouth once daily 08/24/15   Minna Merritts, MD  carvedilol (COREG) 3.125 MG tablet take 1 tablet by mouth every morning Patient not taking: Reported on 10/28/2015 08/24/15   Minna Merritts, MD  furosemide (LASIX) 40 MG tablet Take 1 tablet (40 mg total) by mouth daily. 06/13/13   Darlin Coco, MD  Multiple Vitamins-Minerals (MULTI-VITAMIN GUMMIES) CHEW Chew by mouth daily.    Historical Provider, MD  omeprazole (PRILOSEC) 20 MG capsule take 1 capsule by mouth once daily if needed 10/19/15   [name], MD  rosuvastatin (CRESTOR) 20 MG tablet take 1 tablet by mouth at bedtime 10/04/15   Minna Merritts, MD     VITAL SIGNS:  Blood pressure 97/46, pulse 75, temperature 98 F (36.7 C), resp. rate 21,  height 6' (1.829 m), weight 117.935 kg (260 lb), SpO2 97 %.  PHYSICAL EXAMINATION:  Physical Exam  GENERAL:  80 y.o.-year-old patient lying in the bed with no acute distress. Obese EYES: Pupils equal, round, reactive to light and accommodation. No scleral icterus. Extraocular muscles intact.  HEENT: Head atraumatic, normocephalic. Oropharynx and nasopharynx clear. No oropharyngeal erythema, moist oral mucosa  NECK:  Supple, no jugular venous distention. No thyroid enlargement, no tenderness.  LUNGS: Normal breath sounds bilaterally, no wheezing, rales, rhonchi. No use of accessory muscles of respiration.  CARDIOVASCULAR: S1, S2 normal. No murmurs, rubs, or gallops.  ABDOMEN: Soft, nontender, nondistended. Bowel sounds present. No organomegaly or mass.  EXTREMITIES: No pedal edema, cyanosis, or clubbing. + 2 pedal & radial pulses b/l.   NEUROLOGIC: Cranial nerves II through XII are intact. No focal Motor or sensory deficits appreciated b/l PSYCHIATRIC: The patient is alert and oriented x 3. Good affect.  SKIN: No obvious rash, lesion, or ulcer.   LABORATORY PANEL:   CBC  Recent Labs Lab 10/28/15 0836  WBC 7.5  HGB 13.0  HCT 39.7*  PLT 176   ------------------------------------------------------------------------------------------------------------------  Chemistries   Recent Labs Lab 10/28/15 0836  NA 141  K 4.3  CL 104  CO2 33*  GLUCOSE 190*  BUN 55*  CREATININE 2.67*  CALCIUM 8.8*  MG 2.4   ------------------------------------------------------------------------------------------------------------------  Cardiac Enzymes  Recent Labs Lab 10/28/15 0836  TROPONINI <0.03   ------------------------------------------------------------------------------------------------------------------  RADIOLOGY:  Dg Chest Portable 1 View  10/28/2015  CLINICAL DATA:  Chest discomfort, substernal pain extending to the left side of chest, shortness of breath. EXAM: PORTABLE CHEST  1 VIEW COMPARISON:  Chest x-ray dated 03/14/2014 FINDINGS: Patient is status post median sternotomy, with sternotomy wires appearing intact and stable in alignment. Left chest wall pacemaker/AICD is stable in position. Cardiomegaly appears stable. Overall cardiomediastinal silhouette  is unchanged in size or configuration. Mitral valve replacement hardware is stable in position. Dense opacity is again seen at the left lung base, compatible with previous report description of a chronic pleural thickening, possibly with overlying atelectasis or small adjacent chronic effusion. Chronic scarring/atelectasis again noted at the right lung base, with adjacent pleural thickening and/or small chronic pleural effusion. No new lung findings seen. No pneumothorax. IMPRESSION: Stable chest x-ray. No evidence of acute cardiopulmonary abnormality. Electronically Signed   By: Franki Cabot M.D.   On: 10/28/2015 09:49     IMPRESSION AND PLAN:   * Chest tightness could be cardiac related or due to his GI issues. Initial troponin is normal which is comforting. We'll admit patient under observation on telemetry and check 2 more sets of troponin. Discussed case with Dr. Rockey Situ of cardiology who will be seeing the patient. Continue aspirin, statin, Coreg. Chest x-ray shows chronic left lower parenchymal scarring unchanged.  * Nausea, vomiting, diarrhea Likely viral gastroenteritis. No abdominal pain. C. difficile sent from emergency room.  * Chronic systolic CHF Will hold Lasix at this time due to  borderline low normal blood pressure and getting dizzy with standing up. Also has had poor oral intake and significant vomiting and diarrhea.  * Insulin-dependent diabetes mellitus Will decrease patient's 70/30 insulin to half the dose. Added sliding scale insulin. Diabetic diet.  * Paroxysmal atrial fibrillation. Continue amiodarone and Coreg. Patient is also on Apixaban.  * DVT prophylaxis Patient is on  Eliquis   All the records are reviewed and case discussed with ED provider. Management plans discussed with the patient, family and they are in agreement.  CODE STATUS: FULL  TOTAL TIME TAKING CARE OF THIS PATIENT: 40 minutes.   Hillary Bow R M.D on 10/28/2015 at 11:22 AM  Between 7am to 6pm - Pager - [phone]  After 6pm go to [email or web address] - password [email or web address] [facility]  Office  [phone]  CC: Primary care physician; Wilhemena Durie, MD  Note: This dictation was prepared with Dragon dictation along with smaller phrase technology. Any transcriptional errors that result from this process are unintentional.

## 2015-10-28 NOTE — ED Notes (Signed)
Brought in via ems from home with chest discomfort which started during the night  States pain is substernal and moving slightly to left side of chest   Around pacer site  Pos SOB

## 2015-10-29 ENCOUNTER — Telehealth: Payer: Self-pay | Admitting: Family Medicine

## 2015-10-29 DIAGNOSIS — K529 Noninfective gastroenteritis and colitis, unspecified: Secondary | ICD-10-CM | POA: Diagnosis not present

## 2015-10-29 DIAGNOSIS — I10 Essential (primary) hypertension: Secondary | ICD-10-CM | POA: Diagnosis not present

## 2015-10-29 DIAGNOSIS — R079 Chest pain, unspecified: Secondary | ICD-10-CM | POA: Diagnosis not present

## 2015-10-29 LAB — BASIC METABOLIC PANEL
Anion gap: 6 (ref 5–15)
BUN: 52 mg/dL — AB (ref 6–20)
CHLORIDE: 102 mmol/L (ref 101–111)
CO2: 32 mmol/L (ref 22–32)
Calcium: 8.6 mg/dL — ABNORMAL LOW (ref 8.9–10.3)
Creatinine, Ser: 2.62 mg/dL — ABNORMAL HIGH (ref 0.61–1.24)
GFR calc Af Amer: 25 mL/min — ABNORMAL LOW (ref >60)
GFR calc non Af Amer: 22 mL/min — ABNORMAL LOW (ref >60)
Glucose, Bld: 121 mg/dL — ABNORMAL HIGH (ref 65–99)
POTASSIUM: 4.2 mmol/L (ref 3.5–5.1)
SODIUM: 140 mmol/L (ref 135–145)

## 2015-10-29 LAB — CBC
HEMATOCRIT: 39.9 % — AB (ref 40.0–52.0)
Hemoglobin: 13.5 g/dL (ref 13.0–18.0)
MCH: 30.7 pg (ref 26.0–34.0)
MCHC: 33.7 g/dL (ref 32.0–36.0)
MCV: 90.9 fL (ref 80.0–100.0)
Platelets: 167 10*3/uL (ref 150–440)
RBC: 4.39 MIL/uL — AB (ref 4.40–5.90)
RDW: 14.4 % (ref 11.5–14.5)
WBC: 5.3 10*3/uL (ref 3.8–10.6)

## 2015-10-29 LAB — GLUCOSE, CAPILLARY: Glucose-Capillary: 106 mg/dL — ABNORMAL HIGH (ref 65–99)

## 2015-10-29 NOTE — Plan of Care (Signed)
Problem: Phase I Progression Outcomes Goal: Anginal pain relieved Outcome: Progressing No voiced complaints of pain this shift.  Slept most of shift after medication given for insomnia per request

## 2015-10-29 NOTE — Progress Notes (Signed)
No further N/V or diarrhea this shift.  Telemetry with Atrial Pacing, Ventricular pacing with ? failure to sense.  Telemetry leads repositioned with no further ectopics noted.  No changes in condition noted.

## 2015-10-29 NOTE — Telephone Encounter (Signed)
Pt is being discharged from [facility] for chest pain today. I have schedule a hospital follow up appointment/MW

## 2015-10-29 NOTE — Care Management Note (Signed)
Case Management Note  Patient Details  Name: Marco [name]. MRN: [id number] Date of Birth: 08-21-1936  Subjective/Objective:  CM consult for equipment. Met with patient at bedside. He states he lives at home with his wife. He uses a cane, walker and a transport chair when necessary. His wife provides transportation. Discussed patient's needs at home. He denies any need for equipment or home health. Provided CM contact information for further concerns.              Action/Plan: No anticipated needs. Case closed.   Expected Discharge Date:                  Expected Discharge Plan:  Home/Self Care  In-House Referral:     Discharge planning Services  CM Consult  Post Acute Care Choice:  Durable Medical Equipment Choice offered to:  Patient  DME Arranged:    DME Agency:     HH Arranged:  Patient Refused [place] Agency:     Status of Service:  Completed, signed off  Medicare Important Message Given:    Date Medicare IM Given:    Medicare IM give by:    Date Additional Medicare IM Given:    Additional Medicare Important Message give by:     If discussed at [place] of Stay Meetings, dates discussed:    Additional Comments:  Jolly Mango, RN 10/29/2015, 9:47 AM

## 2015-10-29 NOTE — Discharge Summary (Signed)
[facility] [place] NAME: Marco [name]    MR#:  [id number]  DATE OF BIRTH:  1936-03-19  DATE OF ADMISSION:  10/28/2015 ADMITTING PHYSICIAN: Hillary Bow, MD  DATE OF DISCHARGE: No discharge date for patient encounter.  PRIMARY CARE PHYSICIAN: Wilhemena Durie, MD    ADMISSION DIAGNOSIS:  Precordial chest pain [R07.2]  DISCHARGE DIAGNOSIS:  Chest pain, noncardiac Viral gastroenteritis GERD without esophagitis   SECONDARY DIAGNOSIS:   Past Medical History  Diagnosis Date  . Mixed Ischemic and Non-ischemic Cardiomyopathy     a. 02/2012 Echo: EF 30-35%, Pulm HTN, and modest RV dysfunction;  b. 10/2012 TEE: EF 25-30%, mild conc LVH, diff HK, mod RV dysfxn, mod dil RA/LA, mild MR, no MS, nl MVR, mod TR, Ao sclerosis.  . Coronary artery disease     a. 1999 PCI RCA;  b. 01/2005 NSTEMI: CABG x 3 (LIMA->LAD, VG->OM, VG->LPL) @ time of MVR.  [name] Sick sinus syndrome ([place])     a. 01/2005 s/p PPM 2/2 heart block following CABG;  b. 05/2012 Gen Change: MDT Adapta ADDRL1 DC PPM, ser # [id number].  . Mitral regurgitation--s/p repair     a. 01/2005 MVR: 28-mm Edwards ET [employer]ix ring annuloplasty; b. 10/2012 TEE: normal fxning MV ring with mild MR, no MS.  . Type II diabetes mellitus ([place])   . Hypertensive heart disease   . Hyperlipidemia   . BPH (benign prostatic hypertrophy)   . CKD (chronic kidney disease), stage IV ([place])   . GERD (gastroesophageal reflux disease)   . Chronic systolic heart failure ([place])     a. 02/2012 Echo: EF 30-35%, Pulm HTN, and modest RV dysfunction;  b. 10/2012 TEE: EF 25-30%, mild conc LVH, diff HK, mod RV dysfxn, mod dil RA/LA, mild MR, no MS, nl MVR, mod TR, Ao sclerosis.  . Obesity   . DVT (deep venous thrombosis) ([place])   . Anemia   . Sleep apnea     a. Intolerant of CPAP  . Basal cell carcinoma     a. 2013 x 2 back and left neck  . AAA (abdominal aortic aneurysm)repaired     a. 12/2006 s/p repair of 6cm infrarenal  AAA with 63mm Dacron graft.  . Atrial fibrillation/Flutter     a. Dx 2014;  b. 10/2012 s/p TEE/DCCV;  c. On amio and Eliquis (CHA2DS2VASc = 6).  [name] PVC (premature ventricular contraction)     a. 02/2009 s/p RFCA ([place]).  . Carotid artery occlusion     a. 11/2008 s/p R CEA.  [name] History of shingles     HOSPITAL COURSE:  Marco [name]  is a 80 y.o. male admitted 10/28/2015 with chief complaint of chest pain. Please see H&P performed by Dr. Darvin Neighbours for further information. He originally complained of chest pain as stated above, placed on telemetry without events, cardiac enzymes were trended and normal patient evaluated by cardiology who feels no further cardiac workup is necessary at this time. On the day of discharge patient's symptoms have completely resolved.  DISCHARGE CONDITIONS:   Improved  CONSULTS OBTAINED:  Treatment Team:  Minna Merritts, MD  DRUG ALLERGIES:   Allergies  Allergen Reactions  . Shellfish Allergy Anaphylaxis    DISCHARGE MEDICATIONS:  No changes in home medications upon discharge   Current Discharge Medication List    CONTINUE these medications which have NOT CHANGED   Details  !! amiodarone (PACERONE) 200 MG tablet Take 200 mg by mouth  daily.    apixaban (ELIQUIS) 5 MG TABS tablet Take 5 mg by mouth 2 (two) times daily.     calcitRIOL (ROCALTROL) 0.5 MCG capsule Take 0.5 mcg by mouth daily.    !! carvedilol (COREG) 3.125 MG tablet Take 3.125 mg by mouth daily.    !! carvedilol (COREG) 6.25 MG tablet Take 6.25 mg by mouth every evening.    cholecalciferol (VITAMIN D) 1000 UNITS tablet Take 1,000 Units by mouth daily.    docusate sodium (COLACE) 100 MG capsule Take 100 mg by mouth 2 (two) times daily.      finasteride (PROSCAR) 5 MG tablet Take 5 mg by mouth daily.    furosemide (LASIX) 40 MG tablet Take 40 mg by mouth 2 (two) times daily.    insulin lispro protamine-lispro (HUMALOG 75/25 MIX) (75-25) 100 UNIT/ML SUSP injection Inject 15-16  Units into the skin 2 (two) times daily.     losartan (COZAAR) 25 MG tablet Take 25 mg by mouth daily.  Refills: 0    Tamsulosin HCl (FLOMAX) 0.4 MG CAPS Take 0.4 mg by mouth at bedtime. 1 po daily    !! amiodarone (PACERONE) 200 MG tablet take 1 tablet by mouth once daily Qty: 30 tablet, Refills: 3    Multiple Vitamins-Minerals (MULTI-VITAMIN GUMMIES) CHEW Chew by mouth daily.    omeprazole (PRILOSEC) 20 MG capsule take 1 capsule by mouth once daily if needed Qty: 30 capsule, Refills: 12    rosuvastatin (CRESTOR) 20 MG tablet take 1 tablet by mouth at bedtime Qty: 90 tablet, Refills: 3     !! - Potential duplicate medications found. Please discuss with provider.       DISCHARGE INSTRUCTIONS:    DIET:  Cardiac diet  DISCHARGE CONDITION:  Stable  ACTIVITY:  Activity as tolerated  OXYGEN:  Home Oxygen: No.   Oxygen Delivery: room air  DISCHARGE LOCATION:  home   If you experience worsening of your admission symptoms, develop shortness of breath, life threatening emergency, suicidal or homicidal thoughts you must seek medical attention immediately by calling 911 or calling your MD immediately  if symptoms less severe.  You Must read complete instructions/literature along with all the possible adverse reactions/side effects for all the Medicines you take and that have been prescribed to you. Take any new Medicines after you have completely understood and accpet all the possible adverse reactions/side effects.   Please note  You were cared for by a hospitalist during your hospital stay. If you have any questions about your discharge medications or the care you received while you were in the hospital after you are discharged, you can call the unit and asked to speak with the hospitalist on call if the hospitalist that took care of you is not available. Once you are discharged, your primary care physician will handle any further medical issues. Please note that NO REFILLS  for any discharge medications will be authorized once you are discharged, as it is imperative that you return to your primary care physician (or establish a relationship with a primary care physician if you do not have one) for your aftercare needs so that they can reassess your need for medications and monitor your lab values.    On the day of Discharge:   VITAL SIGNS:  Blood pressure 107/56, pulse 60, temperature 98.4 F (36.9 C), temperature source Oral, resp. rate 22, height 6' (1.829 m), weight 119.477 kg (263 lb 6.4 oz), SpO2 96 %.  I/O:   Intake/Output Summary (Last  24 hours) at 10/29/15 0958 Last data filed at 10/28/15 2025  Gross per 24 hour  Intake    240 ml  Output    250 ml  Net    -10 ml    PHYSICAL EXAMINATION:  GENERAL:  80 y.o.-year-old patient lying in the bed with no acute distress.  EYES: Pupils equal, round, reactive to light and accommodation. No scleral icterus. Extraocular muscles intact.  HEENT: Head atraumatic, normocephalic. Oropharynx and nasopharynx clear.  NECK:  Supple, no jugular venous distention. No thyroid enlargement, no tenderness.  LUNGS: Normal breath sounds bilaterally, no wheezing, rales,rhonchi or crepitation. No use of accessory muscles of respiration.  CARDIOVASCULAR: S1, S2 normal. No murmurs, rubs, or gallops.  ABDOMEN: Soft, non-tender, non-distended. Bowel sounds present. No organomegaly or mass.  EXTREMITIES: No pedal edema, cyanosis, or clubbing.  NEUROLOGIC: Cranial nerves II through XII are intact. Muscle strength 5/5 in all extremities. Sensation intact. Gait not checked.  PSYCHIATRIC: The patient is alert and oriented x 3.  SKIN: No obvious rash, lesion, or ulcer.   DATA REVIEW:   CBC  Recent Labs Lab 10/29/15 0522  WBC 5.3  HGB 13.5  HCT 39.9*  PLT 167    Chemistries   Recent Labs Lab 10/28/15 0836 10/29/15 0522  NA 141 140  K 4.3 4.2  CL 104 102  CO2 33* 32  GLUCOSE 190* 121*  BUN 55* 52*  CREATININE  2.67* 2.62*  CALCIUM 8.8* 8.6*  MG 2.4  --     Cardiac Enzymes  Recent Labs Lab 10/28/15 2256  TROPONINI <0.03    Microbiology Results  Results for orders placed or performed during the hospital encounter of 10/28/15  C difficile quick scan w PCR reflex     Status: None   Collection Time: 10/28/15 10:52 AM  Result Value Ref Range Status   C Diff antigen NEGATIVE NEGATIVE Final   C Diff toxin NEGATIVE NEGATIVE Final   C Diff interpretation Negative for C. difficile  Final    RADIOLOGY:  Dg Chest Portable 1 View  10/28/2015  CLINICAL DATA:  Chest discomfort, substernal pain extending to the left side of chest, shortness of breath. EXAM: PORTABLE CHEST 1 VIEW COMPARISON:  Chest x-ray dated 03/14/2014 FINDINGS: Patient is status post median sternotomy, with sternotomy wires appearing intact and stable in alignment. Left chest wall pacemaker/AICD is stable in position. Cardiomegaly appears stable. Overall cardiomediastinal silhouette is unchanged in size or configuration. Mitral valve replacement hardware is stable in position. Dense opacity is again seen at the left lung base, compatible with previous report description of a chronic pleural thickening, possibly with overlying atelectasis or small adjacent chronic effusion. Chronic scarring/atelectasis again noted at the right lung base, with adjacent pleural thickening and/or small chronic pleural effusion. No new lung findings seen. No pneumothorax. IMPRESSION: Stable chest x-ray. No evidence of acute cardiopulmonary abnormality. Electronically Signed   By: Franki Cabot M.D.   On: 10/28/2015 09:49     Management plans discussed with the patient, family and they are in agreement.  CODE STATUS:     Code Status Orders        Start     Ordered   10/28/15 1113  Full code   Continuous     10/28/15 1114    Code Status History    Date Active Date Inactive Code Status Order ID Comments User Context   This patient has a current code  status but no historical code status.    Advance  Directive Documentation        Most Recent Value   Type of Advance Directive  Healthcare Power of Attorney   Pre-existing out of facility DNR order (yellow form or pink MOST form)     "MOST" Form in Place?        TOTAL TIME TAKING CARE OF THIS PATIENT: 28 minutes.    Hower,  Karenann Cai.D on 10/29/2015 at 9:58 AM  Between 7am to 6pm - Pager - [phone]  After 6pm go to [email or web address] - password [email or web address] [facility]  Office  [phone]  CC: Primary care physician; Wilhemena Durie, MD

## 2015-10-29 NOTE — Progress Notes (Signed)
CSW received a consult for DME. CSW informed RN Tourist information centre manager. RN Case Manager will arranged DME for patient. CSW will continue to follow and assist.   Ernest Pine, MSW, [facility] [place] Work Department [phone]

## 2015-10-29 NOTE — Progress Notes (Signed)
Pt. Discharged to home via wc. Discharge instructions and medication regimen reviewed at bedside with patient and spouse. Both verbalize understanding of instructions and medication regimen. Patient assessment unchanged from this morning. TELE and IV discontinued per policy.

## 2015-10-30 ENCOUNTER — Emergency Department: Payer: Medicare Other

## 2015-10-30 ENCOUNTER — Emergency Department
Admission: EM | Admit: 2015-10-30 | Discharge: 2015-10-30 | Disposition: A | Discharge status: Home / Self Care | Payer: Medicare Other | Attending: Student | Admitting: Student

## 2015-10-30 ENCOUNTER — Other Ambulatory Visit: Payer: Self-pay | Admitting: Family Medicine

## 2015-10-30 DIAGNOSIS — E1122 Type 2 diabetes mellitus with diabetic chronic kidney disease: Secondary | ICD-10-CM | POA: Insufficient documentation

## 2015-10-30 DIAGNOSIS — N390 Urinary tract infection, site not specified: Secondary | ICD-10-CM | POA: Insufficient documentation

## 2015-10-30 DIAGNOSIS — I5022 Chronic systolic (congestive) heart failure: Secondary | ICD-10-CM | POA: Insufficient documentation

## 2015-10-30 DIAGNOSIS — I130 Hypertensive heart and chronic kidney disease with heart failure and stage 1 through stage 4 chronic kidney disease, or unspecified chronic kidney disease: Secondary | ICD-10-CM | POA: Diagnosis not present

## 2015-10-30 DIAGNOSIS — N184 Chronic kidney disease, stage 4 (severe): Secondary | ICD-10-CM | POA: Insufficient documentation

## 2015-10-30 DIAGNOSIS — Z9981 Dependence on supplemental oxygen: Secondary | ICD-10-CM | POA: Diagnosis not present

## 2015-10-30 DIAGNOSIS — Z79899 Other long term (current) drug therapy: Secondary | ICD-10-CM | POA: Diagnosis not present

## 2015-10-30 DIAGNOSIS — Z87891 Personal history of nicotine dependence: Secondary | ICD-10-CM | POA: Insufficient documentation

## 2015-10-30 DIAGNOSIS — R112 Nausea with vomiting, unspecified: Secondary | ICD-10-CM | POA: Diagnosis not present

## 2015-10-30 DIAGNOSIS — R197 Diarrhea, unspecified: Secondary | ICD-10-CM | POA: Diagnosis not present

## 2015-10-30 DIAGNOSIS — Z794 Long term (current) use of insulin: Secondary | ICD-10-CM | POA: Diagnosis not present

## 2015-10-30 DIAGNOSIS — N200 Calculus of kidney: Secondary | ICD-10-CM | POA: Diagnosis not present

## 2015-10-30 LAB — COMPREHENSIVE METABOLIC PANEL
ALBUMIN: 3.2 g/dL — AB (ref 3.5–5.0)
ALT: 53 U/L (ref 17–63)
ANION GAP: 5 (ref 5–15)
AST: 46 U/L — ABNORMAL HIGH (ref 15–41)
Alkaline Phosphatase: 43 U/L (ref 38–126)
BUN: 50 mg/dL — ABNORMAL HIGH (ref 6–20)
CHLORIDE: 104 mmol/L (ref 101–111)
CO2: 30 mmol/L (ref 22–32)
Calcium: 8.5 mg/dL — ABNORMAL LOW (ref 8.9–10.3)
Creatinine, Ser: 2.53 mg/dL — ABNORMAL HIGH (ref 0.61–1.24)
GFR calc Af Amer: 27 mL/min — ABNORMAL LOW (ref >60)
GFR, EST NON AFRICAN AMERICAN: 23 mL/min — AB (ref >60)
Glucose, Bld: 127 mg/dL — ABNORMAL HIGH (ref 65–99)
POTASSIUM: 3.7 mmol/L (ref 3.5–5.1)
Sodium: 139 mmol/L (ref 135–145)
Total Bilirubin: 0.4 mg/dL (ref 0.3–1.2)
Total Protein: 7.1 g/dL (ref 6.5–8.1)

## 2015-10-30 LAB — URINALYSIS COMPLETE WITH MICROSCOPIC (ARMC ONLY)
BILIRUBIN URINE: NEGATIVE
Glucose, UA: 50 mg/dL — AB
KETONES UR: NEGATIVE mg/dL
Nitrite: NEGATIVE
Protein, ur: 30 mg/dL — AB
SPECIFIC GRAVITY, URINE: 1.016 (ref 1.005–1.030)
Squamous Epithelial / LPF: NONE SEEN
pH: 5 (ref 5.0–8.0)

## 2015-10-30 LAB — CBC WITH DIFFERENTIAL/PLATELET
BASOS PCT: 1 %
Basophils Absolute: 0 10*3/uL (ref 0–0.1)
EOS PCT: 2 %
Eosinophils Absolute: 0.1 10*3/uL (ref 0–0.7)
HEMATOCRIT: 40 % (ref 40.0–52.0)
Hemoglobin: 13.5 g/dL (ref 13.0–18.0)
Lymphocytes Relative: 16 %
Lymphs Abs: 0.8 10*3/uL — ABNORMAL LOW (ref 1.0–3.6)
MCH: 30.7 pg (ref 26.0–34.0)
MCHC: 33.7 g/dL (ref 32.0–36.0)
MCV: 91 fL (ref 80.0–100.0)
MONO ABS: 0.8 10*3/uL (ref 0.2–1.0)
MONOS PCT: 16 %
NEUTROS ABS: 3.3 10*3/uL (ref 1.4–6.5)
Neutrophils Relative %: 65 %
PLATELETS: 174 10*3/uL (ref 150–440)
RBC: 4.39 MIL/uL — ABNORMAL LOW (ref 4.40–5.90)
RDW: 14.2 % (ref 11.5–14.5)
WBC: 5.2 10*3/uL (ref 3.8–10.6)

## 2015-10-30 LAB — LIPASE, BLOOD: LIPASE: 12 U/L (ref 11–51)

## 2015-10-30 MED ORDER — ONDANSETRON 4 MG PO TBDP: 4.0000 mg | ORAL_TABLET | Freq: Three times a day (TID) | ORAL | Status: DC | PRN

## 2015-10-30 MED ORDER — CEPHALEXIN 500 MG PO CAPS: 500.0000 mg | ORAL_CAPSULE | Freq: Two times a day (BID) | ORAL | Status: AC

## 2015-10-30 MED ORDER — ONDANSETRON HCL 4 MG/2ML IJ SOLN
4.0000 mg | Freq: Once | INTRAMUSCULAR | Status: AC
  Administered 2015-10-30: 4 mg via INTRAVENOUS
  Filled 2015-10-30: qty 2

## 2015-10-30 MED ORDER — SODIUM CHLORIDE 0.9 % IV BOLUS (SEPSIS)
500.0000 mL | Freq: Once | INTRAVENOUS | Status: AC
  Administered 2015-10-30: 500 mL via INTRAVENOUS

## 2015-10-30 NOTE — ED Notes (Signed)
Pt normally wear 1.5L [place] at home when needed. Pt reports this is mostly at night. Pt was satting 90% r/a. Placed on 1.5 L [place].

## 2015-10-30 NOTE — ED Notes (Signed)
Pt came via EMS to the ED c/o nausea, vomiting, diarrhea. Pt was seen at the hospital Thursday for n/v/d and chest pain. Pt was released yesterday and began having diarrhea again. Pt denies chest pain presently.

## 2015-10-30 NOTE — ED Provider Notes (Signed)
[facility] Emergency Department Provider Note  ____________________________________________  Time seen: Approximately 7:20 AM  I have reviewed the triage vital signs and the nursing notes.   HISTORY  Chief Complaint Nausea; Emesis; and Diarrhea    HPI Marco Dembek Sr. is a 80 y.o. male with CAD, atrial fibrillation, chronic systolic CHF with 1.5 L home O2 requirement, AAA, AICD, insulin-dependent diabetes mellitus presents for evaluation of 2 days nausea, recurrent nonbloody nonbilious emesis and nonbloody diarrhea, gradual onset, constant since onset, currently moderate to severe. The patient was seen in this emergency department on 10/28/2015 for similar symptoms as well as chest pain which she is no longer having. He was admitted to the hospital with a working diagnosis of viral gastroenteritis, chest pain resolved and after several negative troponins, he was discharged home. He reports that he went home yesterday, ate a chicken [place] and drank a milkshake after which she became severely nauseated and though he has not vomited, his diarrhea persists. He feels dehydrated. He has no chest or abdominal pain and no fevers. No pain or burning with urination. His wife is ill at home with nausea vomiting and diarrhea.   Past Medical History  Diagnosis Date  . Mixed Ischemic and Non-ischemic Cardiomyopathy     a. 02/2012 Echo: EF 30-35%, Pulm HTN, and modest RV dysfunction;  b. 10/2012 TEE: EF 25-30%, mild conc LVH, diff HK, mod RV dysfxn, mod dil RA/LA, mild MR, no MS, nl MVR, mod TR, Ao sclerosis.  . Coronary artery disease     a. 1999 PCI RCA;  b. 01/2005 NSTEMI: CABG x 3 (LIMA->LAD, VG->OM, VG->LPL) @ time of MVR.  [name] Sick sinus syndrome ([place])     a. 01/2005 s/p PPM 2/2 heart block following CABG;  b. 05/2012 Gen Change: MDT Adapta ADDRL1 DC PPM, ser # [id number].  . Mitral regurgitation--s/p repair     a. 01/2005 MVR: 28-mm Edwards ET Logix ring annuloplasty; b.  10/2012 TEE: normal fxning MV ring with mild MR, no MS.  . Type II diabetes mellitus ([place])   . Hypertensive heart disease   . Hyperlipidemia   . BPH (benign prostatic hypertrophy)   . CKD (chronic kidney disease), stage IV ([place])   . GERD (gastroesophageal reflux disease)   . Chronic systolic heart failure ([place])     a. 02/2012 Echo: EF 30-35%, Pulm HTN, and modest RV dysfunction;  b. 10/2012 TEE: EF 25-30%, mild conc LVH, diff HK, mod RV dysfxn, mod dil RA/LA, mild MR, no MS, nl MVR, mod TR, Ao sclerosis.  . Obesity   . DVT (deep venous thrombosis) ([place])   . Anemia   . Sleep apnea     a. Intolerant of CPAP  . Basal cell carcinoma     a. 2013 x 2 back and left neck  . AAA (abdominal aortic aneurysm)repaired     a. 12/2006 s/p repair of 6cm infrarenal AAA with 83mm Dacron graft.  . Atrial fibrillation/Flutter     a. Dx 2014;  b. 10/2012 s/p TEE/DCCV;  c. On amio and Eliquis (CHA2DS2VASc = 6).  [name] PVC (premature ventricular contraction)     a. 02/2009 s/p RFCA ([place]).  . Carotid artery occlusion     a. 11/2008 s/p R CEA.  [name] History of shingles     Patient Active Problem List   Diagnosis Date Noted  . Chest pain 10/28/2015  . Gastroenteritis   . Diarrhea of infectious origin   . Emesis   .  S/P CABG (coronary artery bypass graft)   . Coronary artery disease involving native coronary artery of native heart without angina pectoris   . PAF (paroxysmal atrial fibrillation) ([place])   . Absolute anemia 01/28/2015  . Atherosclerosis of coronary artery 01/28/2015  . Barrett's esophagus 01/28/2015  . Basal cell carcinoma of face 01/28/2015  . Benign fibroma of prostate 01/28/2015  . CCF (congestive cardiac failure) ([place]) 01/28/2015  . Diabetes ([place]) 01/28/2015  . Gastro-esophageal reflux disease without esophagitis 01/28/2015  . Irritable colon 01/28/2015  . Cannot sleep 01/28/2015  . Pleural cavity effusion 01/28/2015  . Apnea, sleep 01/28/2015  . Hematuria 12/21/2014  . Left hand  weakness 09/23/2013  . Essential hypertension 06/23/2013  . Chronic systolic heart failure ([place]) 06/10/2013  . Hypotension, postural 06/10/2013  . Hyperkalemia 06/10/2013  . Adjustment of cardiac pacemaker 06/10/2013  . Atrial fibrillation and flutter ([place]) 10/22/2012  . Pacemaker-Medtronic 06/19/2012  . Pulmonary hypertension ([place]) 03/18/2012  . Occlusion and stenosis of carotid artery without mention of cerebral infarction 02/08/2012  . Morbid obesity ([place]) 08/05/2010  . Sinoatrial node dysfunction (HCC) 06/02/2010  . Stage 4 chronic renal impairment associated with type 2 diabetes mellitus ([place]) 08/09/2009  . AAA 04/21/2009  . Ischemic cardiomyopathy  s/p CABG 11/23/2008  . MITRAL REGURGITATION 11/22/2008  . CAD (coronary artery disease) 11/22/2008  . Hyperlipidemia 03/02/2008  . ALLERGIC RHINITIS 03/02/2008  . OSA (obstructive sleep apnea) 03/02/2008    Past Surgical History  Procedure Laterality Date  . Coronary artery bypass graft      CABG with a  LIMA to the LAD, SVG to circumflex, SVG to posterolateral June2006.  . Mitral valve repair  2006     mitral valve repair with a #28 Edwards logic ring  . Pacemaker placement    . Abdominal aortic aneurysm repair       May 2008,   . Cardiac catheterization  2006    @ [facility]  . Insert / replace / remove pacemaker  2006    Dr. [name]  . Skin cancer excision       x 6  . Cataract extraction      right  . Skin biopsy      left wrist; right elbow  . Transesophageal echocardiogram    . Skin cancer excision    . Permanent pacemaker generator change N/A 06/26/2012    Procedure: PERMANENT PACEMAKER GENERATOR CHANGE;  Surgeon: Deboraha Sprang, MD;  Location: [facility] CATH LAB;  Service: Cardiovascular;  Laterality: N/A;  . Vasectomy      Current Outpatient Rx  Name  Route  Sig  Dispense  Refill  . amiodarone (PACERONE) 200 MG tablet      take 1 tablet by mouth once daily   30 tablet   3   . amiodarone (PACERONE) 200 MG tablet    Oral   Take 200 mg by mouth daily.         [name] apixaban (ELIQUIS) 5 MG TABS tablet   Oral   Take 5 mg by mouth 2 (two) times daily.          . calcitRIOL (ROCALTROL) 0.5 MCG capsule   Oral   Take 0.5 mcg by mouth daily.         . carvedilol (COREG) 3.125 MG tablet   Oral   Take 3.125 mg by mouth daily.         . carvedilol (COREG) 6.25 MG tablet   Oral   Take 6.25 mg by  mouth every evening.         . cholecalciferol (VITAMIN D) 1000 UNITS tablet   Oral   Take 1,000 Units by mouth daily.         [name] docusate sodium (COLACE) 100 MG capsule   Oral   Take 100 mg by mouth 2 (two) times daily.           . finasteride (PROSCAR) 5 MG tablet   Oral   Take 5 mg by mouth daily.         . furosemide (LASIX) 40 MG tablet   Oral   Take 40 mg by mouth 2 (two) times daily.         . insulin lispro protamine-lispro (HUMALOG 75/25 MIX) (75-25) 100 UNIT/ML SUSP injection   Subcutaneous   Inject 15-16 Units into the skin 2 (two) times daily.          [name] losartan (COZAAR) 25 MG tablet   Oral   Take 25 mg by mouth daily.       0   . Multiple Vitamins-Minerals (MULTI-VITAMIN GUMMIES) CHEW   Oral   Chew by mouth daily.         [name] omeprazole (PRILOSEC) 20 MG capsule      take 1 capsule by mouth once daily if needed   30 capsule   12   . rosuvastatin (CRESTOR) 20 MG tablet      take 1 tablet by mouth at bedtime   90 tablet   3   . Tamsulosin HCl (FLOMAX) 0.4 MG CAPS   Oral   Take 0.4 mg by mouth at bedtime. 1 po daily           Allergies Shellfish allergy  Family History  Problem Relation Age of Onset  . Heart attack Mother 39  . Diabetes Sister   . Hypertension Sister   . Heart attack Sister   . Heart attack Brother 19    3 Brothers deceased from [place] in 92's or 32's  . Heart disease Brother     Heart Disease before age 43  . Heart attack Father 48  . Diabetes Father     Amputation: bilateral feet  . Heart disease Father   . Heart attack  Sister   . Heart attack Sister   . Heart attack Brother 11  . Heart attack Brother 57  . Diabetes Son     Social History Social History  Substance Use Topics  . Smoking status: Former Smoker -- 1.50 packs/day for 34 years    Types: Cigarettes    Quit date: 08/28/1987  . Smokeless tobacco: Never Used  . Alcohol Use: 0.6 oz/week    1 Cans of beer per week     Comment: Occasionally but none since his surgery in June    Review of Systems Constitutional: No fever/chills Eyes: No visual changes. ENT: No sore throat. Cardiovascular: Denies chest pain. Respiratory: Denies shortness of breath. Gastrointestinal: No abdominal pain.  + nausea, + vomiting.  +diarrhea.  No constipation. Genitourinary: Negative for dysuria. Musculoskeletal: Negative for back pain. Skin: Negative for rash. Neurological: Negative for headaches, focal weakness or numbness. {**Psychiatric:  10-point ROS otherwise negative.  ____________________________________________   PHYSICAL EXAM:  Filed Vitals:   10/30/15 0730 10/30/15 0845  BP: 137/92 128/68  Pulse: 80 87  Temp: 97.8 F (36.6 C)   TempSrc: Oral   Resp:  23  Height: 6' (1.829 m)   Weight: 273 lb 12.8 oz (124.195 kg)  SpO2: 95% 93%     Constitutional: Alert and oriented. Nontoxic- appearing and in no acute distress. Eyes: Conjunctivae are normal. PERRL. EOMI. Head: Atraumatic. Nose: No congestion/rhinnorhea. Mouth/Throat: Mucous membranes are dry.  Oropharynx non-erythematous. Neck: No stridor. Supple without meningismus. Cardiovascular: Normal rate, regular rhythm. Grossly normal heart sounds.  Good peripheral circulation. Respiratory: Normal respiratory effort.  No retractions. Lungs CTAB. Gastrointestinal: Soft and nontender with mild distention.  No CVA tenderness. Genitourinary: deferred Musculoskeletal: No lower extremity tenderness nor edema.  No joint effusions. Neurologic:  Normal speech and language. No gross focal  neurologic deficits are appreciated. No gait instability. Skin:  Skin is warm, dry and intact. No rash noted. Psychiatric: Mood and affect are normal. Speech and behavior are normal.  ____________________________________________   LABS (all labs ordered are listed, but only abnormal results are displayed)  Labs Reviewed  CBC WITH DIFFERENTIAL/PLATELET - Abnormal; Notable for the following:    RBC 4.39 (*)    Lymphs Abs 0.8 (*)    All other components within normal limits  COMPREHENSIVE METABOLIC PANEL - Abnormal; Notable for the following:    Glucose, Bld 127 (*)    BUN 50 (*)    Creatinine, Ser 2.53 (*)    Calcium 8.5 (*)    Albumin 3.2 (*)    AST 46 (*)    GFR calc non Af Amer 23 (*)    GFR calc Af Amer 27 (*)    All other components within normal limits  URINALYSIS COMPLETEWITH MICROSCOPIC ([facility] ONLY) - Abnormal; Notable for the following:    Color, Urine YELLOW (*)    APPearance HAZY (*)    Glucose, UA 50 (*)    Hgb urine dipstick 2+ (*)    Protein, ur 30 (*)    Leukocytes, UA TRACE (*)    Bacteria, UA RARE (*)    All other components within normal limits  URINE CULTURE  LIPASE, BLOOD   ____________________________________________  EKG  ED ECG REPORT I, Joanne Gavel, the attending physician, personally viewed and interpreted this ECG.   Date: 10/30/2015  EKG Time: 7:39  Rate: 82  Rhythm: Atrial paced rhythm  ____________________________________________  RADIOLOGY  CT abdomen and pelvis  IMPRESSION: 1. No explanation for patient's persistent nausea, vomiting and diarrhea. Specifically, no evidence of enteric or urinary obstruction. 2. Unchanged bilateral nonobstructing nephrolithiasis. 3. Unchanged atrophy of the bilateral kidneys compatible with provided history of renal insufficiency. 4. Cholelithiasis without evidence of cholecystitis on this noncontrast examination. 5. Chronic atelectasis/collapse of the left lower lobe with bibasilar pleural  parenchymal thickening, unchanged since the 02/2014 examination. ____________________________________________   PROCEDURES  Procedure(s) performed: None  Critical Care performed: No  ____________________________________________   INITIAL IMPRESSION / ASSESSMENT AND PLAN / ED COURSE  Pertinent labs & imaging results that were available during my care of the patient were reviewed by me and considered in my medical decision making (see chart for details).  Marco Maveryck Ally Sr. is a 80 y.o. male with CAD, atrial fibrillation, chronic systolic CHF, AAA, AICD, insulin-dependent diabetes mellitus presents for evaluation of 2 days nausea, recurrent nonbloody nonbilious emesis and nonbloody diarrhea. On exam, he is nontoxic appearing and in no acute distress, vital signs stable he is afebrile. He has mild intermittent tachypnea however that appears to be his baseline on chart review. His abdomen is soft and nontender but mildly distended. I suspect this may be continued viral gastroenteritis. His vomiting has resolved however his nausea and diarrhea persist. Will obtain screening  labs, treat his symptoms, likely obtain CT of the abdomen and pelvis given her continued symptoms, distention on exam and advanced age with significant abdominal surgical history. Reassess for disposition.   ----------------------------------------- 10:42 AM on 10/30/2015 -----------------------------------------  Labs reviewed. CBC generally unremarkable. CMP notable for creatinine elevation at 2.53 however this is chronic and appears to be the patient's baseline. AST is mildly elevated. Normal lipase. Urinalysis with trace leukocytes, several red blood cells and white blood cells though rare bacteria. Given that this was a catheterized specimen, we'll start Keflex for possible urinary tract infection while we await cultures. CT of the abdomen and pelvis shows no acute finding which would account for his vomiting and  diarrhea. Suspect continued viral illness. C. difficile was negative just 2 days ago. We'll DC with Zofran, we discussed return precautions, close PCP follow-up. He is comfortable with the discharge plan. ____________________________________________   FINAL CLINICAL IMPRESSION(S) / ED DIAGNOSES  Final diagnoses:  Nausea, vomiting and diarrhea  UTI (lower urinary tract infection)      Joanne Gavel, MD 10/30/15 1045

## 2015-11-01 ENCOUNTER — Other Ambulatory Visit: Payer: Self-pay | Admitting: *Deleted

## 2015-11-01 ENCOUNTER — Other Ambulatory Visit: Payer: Self-pay

## 2015-11-01 LAB — URINE CULTURE: Culture: >=100000

## 2015-11-01 MED ORDER — CARVEDILOL 6.25 MG PO TABS: 6.2500 mg | ORAL_TABLET | Freq: Every evening | ORAL | Status: DC

## 2015-11-01 NOTE — Telephone Encounter (Signed)
Spoke with patient. Patient states he also had severe diarrhea. Not having any more chest tightness and no more diarrhea, he also had vomiting. He states he is better with symptoms but feels weak. He also had UTI. Advised patient to rest and push fluids, blend diet. Patient does not need anything right now-aa

## 2015-11-08 ENCOUNTER — Ambulatory Visit: Payer: Medicare Other | Admitting: Family Medicine

## 2015-11-11 DIAGNOSIS — I509 Heart failure, unspecified: Secondary | ICD-10-CM | POA: Diagnosis not present

## 2015-11-22 ENCOUNTER — Ambulatory Visit (INDEPENDENT_AMBULATORY_CARE_PROVIDER_SITE_OTHER): Payer: Medicare Other | Admitting: Family Medicine

## 2015-11-22 ENCOUNTER — Encounter: Payer: Self-pay | Admitting: Family Medicine

## 2015-11-22 VITALS — BP 132/74 | HR 84 | Temp 98.5°F | Resp 16 | Wt 268.0 lb

## 2015-11-22 DIAGNOSIS — R197 Diarrhea, unspecified: Secondary | ICD-10-CM

## 2015-11-22 DIAGNOSIS — R531 Weakness: Secondary | ICD-10-CM

## 2015-11-22 DIAGNOSIS — R079 Chest pain, unspecified: Secondary | ICD-10-CM

## 2015-11-22 DIAGNOSIS — E1121 Type 2 diabetes mellitus with diabetic nephropathy: Secondary | ICD-10-CM | POA: Diagnosis not present

## 2015-11-22 DIAGNOSIS — Z9181 History of falling: Secondary | ICD-10-CM | POA: Diagnosis not present

## 2015-11-22 LAB — POCT GLYCOSYLATED HEMOGLOBIN (HGB A1C): Hemoglobin A1C: 6.5

## 2015-11-22 MED ORDER — INSULIN LISPRO PROT & LISPRO (75-25 MIX) 100 UNIT/ML ~~LOC~~ SUSP: 5.0000 [IU] | Freq: Two times a day (BID) | SUBCUTANEOUS | Status: DC

## 2015-11-22 NOTE — Progress Notes (Signed)
Patient ID: Marco Grasley Sr., male   DOB: 11-11-1935, 80 y.o.   MRN: [id number]        Patient: Marco Caraway Sr. Male    DOB: November 21, 1935   80 y.o.   MRN: [id number] Visit Date: 11/22/2015  Today's Provider: Wilhemena Durie, MD   Chief Complaint  Patient presents with  . Hospitalization Follow-up  . Diarrhea  . Chest Pain   Subjective:      Follow up Hospitalization  Patient was admitted to [facility] on 10/28/2015 and discharged on 10/29/2015. He was treated for Vomiting, Diarrhea, and Chest Pain. Treatment for this included Medications. He reports excellent compliance with treatment. He reports this condition is Improved.  ------------------------------------------------------------------------------------   Diarrhea  The current episode started 1 to 4 weeks ago. The problem has been resolved. Pertinent negatives include no abdominal pain, chills, fever or vomiting. Nothing aggravates the symptoms. Risk factors include ill contacts. The treatment provided significant relief.  Chest Pain  This is a new problem. The current episode started 1 to 4 weeks ago. The onset quality is sudden. The problem has been resolved. The pain does not radiate. Pertinent negatives include no abdominal pain, diaphoresis, fever, nausea, palpitations or vomiting. The pain is aggravated by movement. The treatment provided significant relief.  Emesis  This is a new problem. The current episode started 1 to 4 weeks ago. The problem has been resolved. There has been no fever. Associated symptoms include chest pain. Pertinent negatives include no abdominal pain, chills, diarrhea or fever. Risk factors include ill contacts. The treatment provided significant relief.    Diabetes Mellitus Type II, Follow-up:   Lab Results  Component Value Date   HGBA1C 6.5 11/22/2015   HGBA1C 6.5 08/16/2015   HGBA1C 6.8* 04/09/2015   Last seen for diabetes 7 months ago.  Management since then includes  Pt has decreased his insulin to 10 units twice a day secondary to hypoglycemic events. He reports excellent compliance with treatment. He is not having side effects.  Current symptoms include none and have been stable.  Episodes of hypoglycemia? yes -    Current Insulin Regimen: 10 Units twice a day  Weight trend: stable  ------------------------------------------------------------------------        Allergies  Allergen Reactions  . Shellfish Allergy Anaphylaxis   Previous Medications   ACCU-CHEK SMARTVIEW TEST STRIP       AMIODARONE (PACERONE) 200 MG TABLET    take 1 tablet by mouth once daily   APIXABAN (ELIQUIS) 5 MG TABS TABLET    Take 5 mg by mouth 2 (two) times daily.    CALCITRIOL (ROCALTROL) 0.5 MCG CAPSULE    Take 0.5 mcg by mouth daily.   CARVEDILOL (COREG) 3.125 MG TABLET    Take 3.125 mg by mouth daily.   CARVEDILOL (COREG) 6.25 MG TABLET    Take 1 tablet (6.25 mg total) by mouth every evening.   CHOLECALCIFEROL (VITAMIN D) 1000 UNITS TABLET    Take 1,000 Units by mouth daily.   DOCUSATE SODIUM (COLACE) 100 MG CAPSULE    Take 100 mg by mouth 2 (two) times daily.     FINASTERIDE (PROSCAR) 5 MG TABLET    Take 5 mg by mouth daily.   FUROSEMIDE (LASIX) 40 MG TABLET    Take 40 mg by mouth 2 (two) times daily.   INSULIN LISPRO PROTAMINE-LISPRO (HUMALOG 75/25 MIX) (75-25) 100 UNIT/ML SUSP INJECTION    Inject 15-16 Units into the skin 2 (two) times daily.  LOSARTAN (COZAAR) 25 MG TABLET    Take 25 mg by mouth daily.    MULTIPLE VITAMINS-MINERALS (MULTI-VITAMIN GUMMIES) CHEW    Chew by mouth daily.   OMEPRAZOLE (PRILOSEC) 20 MG CAPSULE    take 1 capsule by mouth once daily if needed   ONDANSETRON (ZOFRAN ODT) 4 MG DISINTEGRATING TABLET    Take 1 tablet (4 mg total) by mouth every 8 (eight) hours as needed for nausea or vomiting.   ROSUVASTATIN (CRESTOR) 20 MG TABLET    take 1 tablet by mouth at bedtime   TAMSULOSIN (FLOMAX) 0.4 MG CAPS CAPSULE    take 1 capsule by mouth  once daily    Review of Systems  Constitutional: Negative for fever, chills, diaphoresis, activity change, appetite change and unexpected weight change.  HENT: Negative.   Eyes: Negative.   Respiratory: Negative.   Cardiovascular: Positive for chest pain. Negative for palpitations and leg swelling.  Gastrointestinal: Negative for nausea, vomiting, abdominal pain, diarrhea, constipation, blood in stool, abdominal distention, anal bleeding and rectal pain.  Endocrine: Negative.   Allergic/Immunologic: Negative.   Neurological: Negative for light-headedness.  Hematological: Negative.   Psychiatric/Behavioral: Negative.     Social History  Substance Use Topics  . Smoking status: Former Smoker -- 1.50 packs/day for 34 years    Types: Cigarettes    Quit date: 08/28/1987  . Smokeless tobacco: Never Used  . Alcohol Use: 0.6 oz/week    1 Cans of beer per week     Comment: Occasionally but none since his surgery in June   Objective:   BP 132/74 mmHg  Pulse 84  Temp(Src) 98.5 F (36.9 C) (Oral)  Resp 16  Wt 268 lb (121.564 kg)  Physical Exam  Constitutional: He is oriented to person, place, and time. He appears well-developed and well-nourished.  Morbidly obese, chronically ill-appearing white male in no acute distress.  HENT:  Head: Normocephalic and atraumatic.  Right Ear: External ear normal.  Left Ear: External ear normal.  Nose: Nose normal.  Cardiovascular: Normal rate, regular rhythm and normal heart sounds.   Pulmonary/Chest: Effort normal and breath sounds normal.  Abdominal: Soft.  Musculoskeletal: He exhibits edema.  1+ lower extremity edema  Neurological: He is alert and oriented to person, place, and time.  Skin: Skin is warm and dry.  Psychiatric: He has a normal mood and affect. His behavior is normal. Judgment and thought content normal.        Assessment & Plan:     1. Diarrhea, unspecified type Resolved Likely viral gastroenteritis. 2. Chest pain,  unspecified chest pain type Resolved; likely secondary to his nausea and vomiting.   3. Type 2 diabetes mellitus with diabetic nephropathy, without long-term current use of insulin (HCC) A1C is at 6.5% today.  Pt's A1C goal is around 7.5% so he is a too low right now.  Will decrease Humalog 75/25 Mix to 5-8 units twice a day.  Recheck in about 3-4 months.  - POCT glycosylated hemoglobin (Hb A1C) - insulin lispro protamine-lispro (HUMALOG 75/25 MIX) (75-25) 100 UNIT/ML SUSP injection; Inject 5-8 Units into the skin 2 (two) times daily.  Dispense: 10 mL; Refill: 1  4. Weakness Worsening will refer to PT to evaluate and treat.   - Ambulatory referral to Physical Therapy  5. Risk for falls Worsening will refer to PT to evaluate and treat.   - Ambulatory referral to Physical Therapy      I have done the exam and reviewed the above chart and  it is accurate to the best of my knowledge.  Richard Cranford Mon, MD  [facility]

## 2015-11-23 DIAGNOSIS — M9903 Segmental and somatic dysfunction of lumbar region: Secondary | ICD-10-CM | POA: Diagnosis not present

## 2015-11-23 DIAGNOSIS — M5136 Other intervertebral disc degeneration, lumbar region: Secondary | ICD-10-CM | POA: Diagnosis not present

## 2015-11-23 DIAGNOSIS — M5033 Other cervical disc degeneration, cervicothoracic region: Secondary | ICD-10-CM | POA: Diagnosis not present

## 2015-11-23 DIAGNOSIS — M9901 Segmental and somatic dysfunction of cervical region: Secondary | ICD-10-CM | POA: Diagnosis not present

## 2015-11-25 DIAGNOSIS — R262 Difficulty in walking, not elsewhere classified: Secondary | ICD-10-CM | POA: Diagnosis not present

## 2015-11-25 DIAGNOSIS — M6281 Muscle weakness (generalized): Secondary | ICD-10-CM | POA: Diagnosis not present

## 2015-12-01 DIAGNOSIS — R262 Difficulty in walking, not elsewhere classified: Secondary | ICD-10-CM | POA: Diagnosis not present

## 2015-12-01 DIAGNOSIS — M6281 Muscle weakness (generalized): Secondary | ICD-10-CM | POA: Diagnosis not present

## 2015-12-03 ENCOUNTER — Emergency Department
Admission: EM | Admit: 2015-12-03 | Discharge: 2015-12-03 | Disposition: A | Discharge status: Home / Self Care | Payer: Medicare Other | Attending: Emergency Medicine | Admitting: Emergency Medicine

## 2015-12-03 ENCOUNTER — Encounter: Payer: Self-pay | Admitting: Emergency Medicine

## 2015-12-03 DIAGNOSIS — I2510 Atherosclerotic heart disease of native coronary artery without angina pectoris: Secondary | ICD-10-CM | POA: Insufficient documentation

## 2015-12-03 DIAGNOSIS — E785 Hyperlipidemia, unspecified: Secondary | ICD-10-CM | POA: Insufficient documentation

## 2015-12-03 DIAGNOSIS — E669 Obesity, unspecified: Secondary | ICD-10-CM | POA: Insufficient documentation

## 2015-12-03 DIAGNOSIS — I129 Hypertensive chronic kidney disease with stage 1 through stage 4 chronic kidney disease, or unspecified chronic kidney disease: Secondary | ICD-10-CM | POA: Diagnosis not present

## 2015-12-03 DIAGNOSIS — M6281 Muscle weakness (generalized): Secondary | ICD-10-CM | POA: Diagnosis not present

## 2015-12-03 DIAGNOSIS — Z87891 Personal history of nicotine dependence: Secondary | ICD-10-CM | POA: Insufficient documentation

## 2015-12-03 DIAGNOSIS — N400 Enlarged prostate without lower urinary tract symptoms: Secondary | ICD-10-CM | POA: Insufficient documentation

## 2015-12-03 DIAGNOSIS — R319 Hematuria, unspecified: Secondary | ICD-10-CM | POA: Diagnosis not present

## 2015-12-03 DIAGNOSIS — I110 Hypertensive heart disease with heart failure: Secondary | ICD-10-CM | POA: Insufficient documentation

## 2015-12-03 DIAGNOSIS — I052 Rheumatic mitral stenosis with insufficiency: Secondary | ICD-10-CM | POA: Insufficient documentation

## 2015-12-03 DIAGNOSIS — N184 Chronic kidney disease, stage 4 (severe): Secondary | ICD-10-CM | POA: Diagnosis not present

## 2015-12-03 DIAGNOSIS — Z79899 Other long term (current) drug therapy: Secondary | ICD-10-CM | POA: Insufficient documentation

## 2015-12-03 DIAGNOSIS — R300 Dysuria: Secondary | ICD-10-CM

## 2015-12-03 DIAGNOSIS — I5022 Chronic systolic (congestive) heart failure: Secondary | ICD-10-CM | POA: Diagnosis not present

## 2015-12-03 DIAGNOSIS — I480 Paroxysmal atrial fibrillation: Secondary | ICD-10-CM | POA: Diagnosis not present

## 2015-12-03 DIAGNOSIS — Z951 Presence of aortocoronary bypass graft: Secondary | ICD-10-CM | POA: Diagnosis not present

## 2015-12-03 DIAGNOSIS — Z86718 Personal history of other venous thrombosis and embolism: Secondary | ICD-10-CM | POA: Insufficient documentation

## 2015-12-03 DIAGNOSIS — Z950 Presence of cardiac pacemaker: Secondary | ICD-10-CM | POA: Insufficient documentation

## 2015-12-03 DIAGNOSIS — C44310 Basal cell carcinoma of skin of unspecified parts of face: Secondary | ICD-10-CM | POA: Insufficient documentation

## 2015-12-03 DIAGNOSIS — R262 Difficulty in walking, not elsewhere classified: Secondary | ICD-10-CM | POA: Diagnosis not present

## 2015-12-03 LAB — URINALYSIS COMPLETE WITH MICROSCOPIC (ARMC ONLY)
BILIRUBIN URINE: NEGATIVE
Bacteria, UA: NONE SEEN
GLUCOSE, UA: 50 mg/dL — AB
Ketones, ur: NEGATIVE mg/dL
NITRITE: NEGATIVE
PH: 6 (ref 5.0–8.0)
PROTEIN: NEGATIVE mg/dL
SPECIFIC GRAVITY, URINE: 1.01 (ref 1.005–1.030)

## 2015-12-03 NOTE — ED Notes (Signed)
Reports blood in urine x 2 days.  Reports recent uti and took antibiotics and it "got much better but now coming back"

## 2015-12-03 NOTE — ED Provider Notes (Signed)
[facility] Emergency Department Provider Note   ____________________________________________  Time seen: Approximately 2:10 PM I have reviewed the triage vital signs and the triage nursing note.  HISTORY  Chief Complaint Hematuria   Historian Patient and wife  HPI Marco [name]. 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 Patient states he came in for GI illness a few weeks ago and was treated with an antibiotic for urinary tract infection. He states he did not have imaging at that point in time. He does follow with the urologist, Dr. Tresa Moore, for history of "prostate issues. "He takes finasteride and tamsulosin. Patient states that he improved a little bit after taking antibiotics in terms of dysuria, but then on Wednesday, 2 days ago, he started having bloody urine again. His gastrointestinal symptoms are relieved.  No abdominal pain. He doesn't chronic low back pain due to degenerative disc disease, and does not report really any worsening or different tenderness.  No vomiting. No fever.    Past Medical History  Diagnosis Date  . Mixed Ischemic and Non-ischemic Cardiomyopathy     a. 02/2012 Echo: EF 30-35%, Pulm HTN, and modest RV dysfunction;  b. 10/2012 TEE: EF 25-30%, mild conc LVH, diff HK, mod RV dysfxn, mod dil RA/LA, mild MR, no MS, nl MVR, mod TR, Ao sclerosis.  . Coronary artery disease     a. 1999 PCI RCA;  b. 01/2005 NSTEMI: CABG x 3 (LIMA->LAD, VG->OM, VG->LPL) @ time of MVR.  [name] Sick sinus syndrome ([place])     a. 01/2005 s/p PPM 2/2 heart block following CABG;  b. 05/2012 Gen Change: MDT Adapta ADDRL1 DC PPM, ser # [id number].  . Mitral regurgitation--s/p repair     a. 01/2005 MVR: 28-mm Edwards ET Logix ring annuloplasty; b. 10/2012 TEE: normal fxning MV ring with mild MR, no MS.  . Type II diabetes mellitus ([place])   . Hypertensive heart disease   . Hyperlipidemia   . BPH (benign prostatic  hypertrophy)   . CKD (chronic kidney disease), stage IV ([place])   . GERD (gastroesophageal reflux disease)   . Chronic systolic heart failure ([place])     a. 02/2012 Echo: EF 30-35%, Pulm HTN, and modest RV dysfunction;  b. 10/2012 TEE: EF 25-30%, mild conc LVH, diff HK, mod RV dysfxn, mod dil RA/LA, mild MR, no MS, nl MVR, mod TR, Ao sclerosis.  . Obesity   . DVT (deep venous thrombosis) ([place])   . Anemia   . Sleep apnea     a. Intolerant of CPAP  . Basal cell carcinoma     a. 2013 x 2 back and left neck  . AAA (abdominal aortic aneurysm)repaired     a. 12/2006 s/p repair of 6cm infrarenal AAA with 63mm Dacron graft.  . Atrial fibrillation/Flutter     a. Dx 2014;  b. 10/2012 s/p TEE/DCCV;  c. On amio and Eliquis (CHA2DS2VASc = 6).  [name] PVC (premature ventricular contraction)     a. 02/2009 s/p RFCA ([place]).  . Carotid artery occlusion     a. 11/2008 s/p R CEA.  [name] History of shingles     Patient Active Problem List   Diagnosis Date Noted  . Chest pain 10/28/2015  . Gastroenteritis   . Diarrhea of infectious origin   . Emesis   . S/P CABG (coronary artery bypass graft)   . Coronary artery disease involving native coronary artery of native heart without  angina pectoris   . PAF (paroxysmal atrial fibrillation) ([place])   . Absolute anemia 01/28/2015  . Atherosclerosis of coronary artery 01/28/2015  . Barrett's esophagus 01/28/2015  . Basal cell carcinoma of face 01/28/2015  . Benign fibroma of prostate 01/28/2015  . CCF (congestive cardiac failure) ([place]) 01/28/2015  . Diabetes ([place]) 01/28/2015  . Gastro-esophageal reflux disease without esophagitis 01/28/2015  . Irritable colon 01/28/2015  . Cannot sleep 01/28/2015  . Pleural cavity effusion 01/28/2015  . Apnea, sleep 01/28/2015  . Hematuria 12/21/2014  . Left hand weakness 09/23/2013  . Essential hypertension 06/23/2013  . Chronic systolic heart failure ([place]) 06/10/2013  . Hypotension, postural 06/10/2013  . Hyperkalemia 06/10/2013   . Adjustment of cardiac pacemaker 06/10/2013  . Atrial fibrillation and flutter ([place]) 10/22/2012  . Pacemaker-Medtronic 06/19/2012  . Pulmonary hypertension ([place]) 03/18/2012  . Occlusion and stenosis of carotid artery without mention of cerebral infarction 02/08/2012  . Morbid obesity ([place]) 08/05/2010  . Sinoatrial node dysfunction ([facility]) 06/02/2010  . Stage 4 chronic renal impairment associated with type 2 diabetes mellitus ([place]) 08/09/2009  . AAA 04/21/2009  . Ischemic cardiomyopathy  s/p CABG 11/23/2008  . MITRAL REGURGITATION 11/22/2008  . CAD (coronary artery disease) 11/22/2008  . Hyperlipidemia 03/02/2008  . ALLERGIC RHINITIS 03/02/2008  . OSA (obstructive sleep apnea) 03/02/2008    Past Surgical History  Procedure Laterality Date  . Coronary artery bypass graft      CABG with a  LIMA to the LAD, SVG to circumflex, SVG to posterolateral June2006.  . Mitral valve repair  2006     mitral valve repair with a #28 Edwards logic ring  . Pacemaker placement    . Abdominal aortic aneurysm repair       May 2008,   . Cardiac catheterization  2006    @ [facility]  . Insert / replace / remove pacemaker  2006    Dr. [name]  . Skin cancer excision       x 6  . Cataract extraction      right  . Skin biopsy      left wrist; right elbow  . Transesophageal echocardiogram    . Skin cancer excision    . Permanent pacemaker generator change N/A 06/26/2012    Procedure: PERMANENT PACEMAKER GENERATOR CHANGE;  Surgeon: Deboraha Sprang, MD;  Location[facility] CATH LAB;  Service: Cardiovascular;  Laterality: N/A;  . Vasectomy      Current Outpatient Rx  Name  Route  Sig  Dispense  Refill  . ACCU-CHEK SMARTVIEW test strip            1     Dispense as written.   [name] amiodarone (PACERONE) 200 MG tablet      take 1 tablet by mouth once daily   30 tablet   3   . apixaban (ELIQUIS) 5 MG TABS tablet   Oral   Take 5 mg by mouth 2 (two) times daily.          . calcitRIOL (ROCALTROL) 0.5 MCG  capsule   Oral   Take 0.5 mcg by mouth daily.         . carvedilol (COREG) 3.125 MG tablet   Oral   Take 3.125 mg by mouth daily.         . carvedilol (COREG) 6.25 MG tablet   Oral   Take 1 tablet (6.25 mg total) by mouth every evening.   60 tablet   3   . cholecalciferol (VITAMIN D)  1000 UNITS tablet   Oral   Take 1,000 Units by mouth daily.         [name] docusate sodium (COLACE) 100 MG capsule   Oral   Take 100 mg by mouth 2 (two) times daily.           . finasteride (PROSCAR) 5 MG tablet   Oral   Take 5 mg by mouth daily.         . furosemide (LASIX) 40 MG tablet   Oral   Take 40 mg by mouth 2 (two) times daily.         . insulin lispro protamine-lispro (HUMALOG 75/25 MIX) (75-25) 100 UNIT/ML SUSP injection   Subcutaneous   Inject 5-8 Units into the skin 2 (two) times daily.   10 mL   1   . losartan (COZAAR) 25 MG tablet   Oral   Take 25 mg by mouth daily.       0   . Multiple Vitamins-Minerals (MULTI-VITAMIN GUMMIES) CHEW   Oral   Chew by mouth daily.         [name] omeprazole (PRILOSEC) 20 MG capsule      take 1 capsule by mouth once daily if needed   30 capsule   12   . ondansetron (ZOFRAN ODT) 4 MG disintegrating tablet   Oral   Take 1 tablet (4 mg total) by mouth every 8 (eight) hours as needed for nausea or vomiting. Patient not taking: Reported on 11/22/2015   12 tablet   0   . rosuvastatin (CRESTOR) 20 MG tablet      take 1 tablet by mouth at bedtime   90 tablet   3   . tamsulosin (FLOMAX) 0.4 MG CAPS capsule      take 1 capsule by mouth once daily   30 capsule   12     Allergies Shellfish allergy  Family History  Problem Relation Age of Onset  . Heart attack Mother 73  . Diabetes Sister   . Hypertension Sister   . Heart attack Sister   . Heart attack Brother 23    3 Brothers deceased from [place] in 10's or 18's  . Heart disease Brother     Heart Disease before age 48  . Heart attack Father 49  . Diabetes Father      Amputation: bilateral feet  . Heart disease Father   . Heart attack Sister   . Heart attack Sister   . Heart attack Brother 47  . Heart attack Brother 24  . Diabetes Son     Social History Social History  Substance Use Topics  . Smoking status: Former Smoker -- 1.50 packs/day for 34 years    Types: Cigarettes    Quit date: 08/28/1987  . Smokeless tobacco: Never Used  . Alcohol Use: 0.6 oz/week    1 Cans of beer per week     Comment: Occasionally but none since his surgery in June    Review of Systems  Constitutional: Negative for fever. Eyes: Negative for visual changes. ENT: Negative for sore throat. Cardiovascular: Negative for chest pain. Respiratory: Negative for shortness of breath. Gastrointestinal: Negative for abdominal pain, vomiting and diarrhea. Genitourinary: Positive for dysuria. Musculoskeletal: Positive for chronic and unchanged back pain. Skin: Negative for rash. Neurological: Negative for headache. 10 point Review of Systems otherwise negative ____________________________________________   PHYSICAL EXAM:  VITAL SIGNS: ED Triage Vitals  Enc Vitals Group     BP 12/03/15 1230 115/60  mmHg     Pulse Rate 12/03/15 1230 80     Resp 12/03/15 1230 20     Temp 12/03/15 1230 98.3 F (36.8 C)     Temp Source 12/03/15 1230 Oral     SpO2 12/03/15 1230 95 %     Weight 12/03/15 1230 256 lb (116.121 kg)     Height 12/03/15 1230 6' (1.829 m)     Head Cir --      Peak Flow --      Pain Score 12/03/15 1231 1     Pain Loc --      Pain Edu? --      Excl. in [place]? --      Constitutional: Alert and oriented. Well appearing and in no distress. HEENT   Head: Normocephalic and atraumatic.      Eyes: Conjunctivae are normal. PERRL. Normal extraocular movements.      Ears:         Nose: No congestion/rhinnorhea.   Mouth/Throat: Mucous membranes are moist.   Neck: No stridor. Cardiovascular/Chest: Normal rate, regular rhythm.  No murmurs, rubs, or  gallops. Respiratory: Normal respiratory effort without tachypnea nor retractions. Breath sounds are clear and equal bilaterally. No wheezes/rales/rhonchi. Gastrointestinal: Soft. No distention, no guarding, no rebound. Nontender. Slightly obese.  Genitourinary/rectal:Deferred Musculoskeletal: Nontender with normal range of motion in all extremities.  Neurologic:  Normal speech and language. No gross or focal neurologic deficits are appreciated. Skin:  Skin is warm, dry and intact. No rash noted. Psychiatric: Mood and affect are normal. Speech and behavior are normal. Patient exhibits appropriate insight and judgment.  ____________________________________________   EKG I, Lisa Roca, MD, the attending physician have personally viewed and interpreted all ECGs.  None ____________________________________________  LABS (pertinent positives/negatives)  Urinalysis 1+ leukocytes, too numerous to count red blood cells, 6-30 white blood cells, no bacteria. No ketones. Negative nitrites.  ____________________________________________  RADIOLOGY All Xrays were viewed by me. Imaging interpreted by Radiologist.  None __________________________________________  PROCEDURES  Procedure(s) performed: None  Critical Care performed: None  ____________________________________________   ED COURSE / ASSESSMENT AND PLAN  Pertinent labs & imaging results that were available during my care of the patient were reviewed by me and considered in my medical decision making (see chart for details).   This patient is here for dysuria and hematuria. He was recently treated for urinary tract infection and states that some of his symptoms have resolved, but now he slightly worse again. No vomiting or fevers. His urinalysis here does show too numerous count red blood cells, but without bacteria or nitrates, right now not really suspicious of a new acute urinary tract infection. A urine culture will be  sent.  I reviewed his prior visit in the emergency department on 10/30/15, he did have a CT scan abdomen at that point in time. Given he is not complaining of abdominal or new back symptoms, I do not feel he needs a CT today. I discussed course versus benefit and in chair decision-making we chose to hold off today. No neurologic symptoms or abdominal pain, not really suspicious for vascular emergency.  His hematuria may be due to the fact that he is on Eloquis. He does already follow with a urologist, and I will have him follow-up with the urology appointment.  We discussed stopping the Eloquis versus continuing on and. He is on it for history of CABG and pacemaker. We decided to go ahead and continue, at this point in time.   CONSULTATIONS:  None   Patient / Family / Caregiver informed of clinical course, medical decision-making process, and agree with plan.   I discussed return precautions, follow-up instructions, and discharged instructions with patient and/or family.   ___________________________________________   FINAL CLINICAL IMPRESSION(S) / ED DIAGNOSES   Final diagnoses:  Hematuria  Dysuria              Note: This dictation was prepared with Dragon dictation. Any transcriptional errors that result from this process are unintentional   Lisa Roca, MD 12/03/15 1454

## 2015-12-03 NOTE — ED Notes (Signed)
Pt reports blood in urine for  2-3 days, pt denies any other symptoms

## 2015-12-03 NOTE — Discharge Instructions (Signed)
We discussed, no certain cause was found for the blood in your urine, but I suspect it is likely because you are taking the blood thinner Eloquis.  Your urine showed no bacteria, but the sample is being sent for a culture and if it grows an infection, you will be called to start on a new antibiotic.  We discussed that for the time being I am okay with you continuing your blood thinner, but if you continue to have bleeding, a next step might be to stop the blood thinner for a few days.  Return to the emergency department for any worsening bleeding, any abdominal pain, any weakness or numbness in the legs, any chest pain, fever or vomiting, black or bloody stools, dizziness or passing out, or any other symptoms concerning to you.  Please make an appointment with your urologist this week.   Hematuria, Adult Hematuria is blood in your urine. It can be caused by a bladder infection, kidney infection, prostate infection, kidney stone, or cancer of your urinary tract. Infections can usually be treated with medicine, and a kidney stone usually will pass through your urine. If neither of these is the cause of your hematuria, further workup to find out the reason may be needed. It is very important that you tell your health care provider about any blood you see in your urine, even if the blood stops without treatment or happens without causing pain. Blood in your urine that happens and then stops and then happens again can be a symptom of a very serious condition. Also, pain is not a symptom in the initial stages of many urinary cancers. HOME CARE INSTRUCTIONS   Drink lots of fluid, 3-4 quarts a day. If you have been diagnosed with an infection, cranberry juice is especially recommended, in addition to large amounts of water.  Avoid caffeine, tea, and carbonated beverages because they tend to irritate the bladder.  Avoid alcohol because it may irritate the prostate.  Take all medicines as directed by your  health care provider.  If you were prescribed an antibiotic medicine, finish it all even if you start to feel better.  If you have been diagnosed with a kidney stone, follow your health care provider's instructions regarding straining your urine to catch the stone.  Empty your bladder often. Avoid holding urine for long periods of time.  After a bowel movement, women should cleanse front to back. Use each tissue only once.  Empty your bladder before and after sexual intercourse if you are a male. SEEK MEDICAL CARE IF:  You develop back pain.  You have a fever.  You have a feeling of sickness in your stomach (nausea) or vomiting.  Your symptoms are not better in 3 days. Return sooner if you are getting worse. SEEK IMMEDIATE MEDICAL CARE IF:   You develop severe vomiting and are unable to keep the medicine down.  You develop severe back or abdominal pain despite taking your medicines.  You begin passing a large amount of blood or clots in your urine.  You feel extremely weak or faint, or you pass out. MAKE SURE YOU:   Understand these instructions.  Will watch your condition.  Will get help right away if you are not doing well or get worse.   This information is not intended to replace advice given to you by your health care provider. Make sure you discuss any questions you have with your health care provider.   Document Released: 08/14/2005 Document Revised: 09/04/2014  Document Reviewed: 04/14/2013 Elsevier Interactive Patient Education 2016 Elsevier Inc[employer]  Dysuria Dysuria is pain or discomfort while urinating. The pain or discomfort may be felt in the tube that carries urine out of the bladder (urethra) or in the surrounding tissue of the genitals. The pain may also be felt in the groin area, lower abdomen, and lower back. You may have to urinate frequently or have the sudden feeling that you have to urinate (urgency). Dysuria can affect both men and women, but is more  common in women. Dysuria can be caused by many different things, including:  Urinary tract infection in women.  Infection of the kidney or bladder.  Kidney stones or bladder stones.  Certain sexually transmitted infections (STIs), such as chlamydia.  Dehydration.  Inflammation of the vagina.  Use of certain medicines.  Use of certain soaps or scented products that cause irritation. HOME CARE INSTRUCTIONS Watch your dysuria for any changes. The following actions may help to reduce any discomfort you are feeling:  Drink enough fluid to keep your urine clear or pale yellow.  Empty your bladder often. Avoid holding urine for long periods of time.  After a bowel movement or urination, women should cleanse from front to back, using each tissue only once.  Empty your bladder after sexual intercourse.  Take medicines only as directed by your health care provider.  If you were prescribed an antibiotic medicine, finish it all even if you start to feel better.  Avoid caffeine, tea, and alcohol. They can irritate the bladder and make dysuria worse. In men, alcohol may irritate the prostate.  Keep all follow-up visits as directed by your health care provider. This is important.  If you had any tests done to find the cause of dysuria, it is your responsibility to obtain your test results. Ask the lab or department performing the test when and how you will get your results. Talk with your health care provider if you have any questions about your results. SEEK MEDICAL CARE IF:  You develop pain in your back or sides.  You have a fever.  You have nausea or vomiting.  You have blood in your urine.  You are not urinating as often as you usually do. SEEK IMMEDIATE MEDICAL CARE IF:  You pain is severe and not relieved with medicines.  You are unable to hold down any fluids.  You or someone else notices a change in your mental function.  You have a rapid heartbeat at rest.  You  have shaking or chills.  You feel extremely weak.   This information is not intended to replace advice given to you by your health care provider. Make sure you discuss any questions you have with your health care provider.   Document Released: 05/12/2004 Document Revised: 09/04/2014 Document Reviewed: 04/09/2014 Elsevier Interactive Patient Education [employer].

## 2015-12-05 ENCOUNTER — Other Ambulatory Visit: Payer: Self-pay | Admitting: Family Medicine

## 2015-12-06 LAB — URINE CULTURE

## 2015-12-07 DIAGNOSIS — R262 Difficulty in walking, not elsewhere classified: Secondary | ICD-10-CM | POA: Diagnosis not present

## 2015-12-07 DIAGNOSIS — M6281 Muscle weakness (generalized): Secondary | ICD-10-CM | POA: Diagnosis not present

## 2015-12-09 DIAGNOSIS — R262 Difficulty in walking, not elsewhere classified: Secondary | ICD-10-CM | POA: Diagnosis not present

## 2015-12-09 DIAGNOSIS — M6281 Muscle weakness (generalized): Secondary | ICD-10-CM | POA: Diagnosis not present

## 2015-12-10 ENCOUNTER — Other Ambulatory Visit: Payer: Self-pay | Admitting: Cardiovascular Disease

## 2015-12-12 DIAGNOSIS — I509 Heart failure, unspecified: Secondary | ICD-10-CM | POA: Diagnosis not present

## 2015-12-13 ENCOUNTER — Ambulatory Visit: Payer: Medicare Other | Admitting: Family Medicine

## 2015-12-14 DIAGNOSIS — N184 Chronic kidney disease, stage 4 (severe): Secondary | ICD-10-CM | POA: Diagnosis not present

## 2015-12-14 DIAGNOSIS — I10 Essential (primary) hypertension: Secondary | ICD-10-CM | POA: Diagnosis not present

## 2015-12-14 DIAGNOSIS — E1122 Type 2 diabetes mellitus with diabetic chronic kidney disease: Secondary | ICD-10-CM | POA: Diagnosis not present

## 2015-12-14 DIAGNOSIS — N2581 Secondary hyperparathyroidism of renal origin: Secondary | ICD-10-CM | POA: Diagnosis not present

## 2015-12-15 DIAGNOSIS — R262 Difficulty in walking, not elsewhere classified: Secondary | ICD-10-CM | POA: Diagnosis not present

## 2015-12-15 DIAGNOSIS — E1140 Type 2 diabetes mellitus with diabetic neuropathy, unspecified: Secondary | ICD-10-CM | POA: Diagnosis not present

## 2015-12-15 DIAGNOSIS — M6281 Muscle weakness (generalized): Secondary | ICD-10-CM | POA: Diagnosis not present

## 2015-12-15 DIAGNOSIS — L851 Acquired keratosis [keratoderma] palmaris et plantaris: Secondary | ICD-10-CM | POA: Diagnosis not present

## 2015-12-15 DIAGNOSIS — Z794 Long term (current) use of insulin: Secondary | ICD-10-CM | POA: Diagnosis not present

## 2015-12-15 DIAGNOSIS — B351 Tinea unguium: Secondary | ICD-10-CM | POA: Diagnosis not present

## 2015-12-17 DIAGNOSIS — R262 Difficulty in walking, not elsewhere classified: Secondary | ICD-10-CM | POA: Diagnosis not present

## 2015-12-17 DIAGNOSIS — M6281 Muscle weakness (generalized): Secondary | ICD-10-CM | POA: Diagnosis not present

## 2015-12-21 DIAGNOSIS — M5033 Other cervical disc degeneration, cervicothoracic region: Secondary | ICD-10-CM | POA: Diagnosis not present

## 2015-12-21 DIAGNOSIS — M5136 Other intervertebral disc degeneration, lumbar region: Secondary | ICD-10-CM | POA: Diagnosis not present

## 2015-12-21 DIAGNOSIS — M9903 Segmental and somatic dysfunction of lumbar region: Secondary | ICD-10-CM | POA: Diagnosis not present

## 2015-12-21 DIAGNOSIS — M9901 Segmental and somatic dysfunction of cervical region: Secondary | ICD-10-CM | POA: Diagnosis not present

## 2015-12-22 ENCOUNTER — Encounter: Payer: Self-pay | Admitting: Cardiovascular Disease

## 2015-12-22 ENCOUNTER — Ambulatory Visit (INDEPENDENT_AMBULATORY_CARE_PROVIDER_SITE_OTHER): Payer: Medicare Other | Admitting: Cardiovascular Disease

## 2015-12-22 VITALS — BP 99/62 | HR 79 | Ht 72.0 in | Wt 265.0 lb

## 2015-12-22 DIAGNOSIS — I951 Orthostatic hypotension: Secondary | ICD-10-CM

## 2015-12-22 DIAGNOSIS — I2510 Atherosclerotic heart disease of native coronary artery without angina pectoris: Secondary | ICD-10-CM

## 2015-12-22 DIAGNOSIS — I4892 Unspecified atrial flutter: Secondary | ICD-10-CM

## 2015-12-22 DIAGNOSIS — I480 Paroxysmal atrial fibrillation: Secondary | ICD-10-CM | POA: Diagnosis not present

## 2015-12-22 DIAGNOSIS — I5022 Chronic systolic (congestive) heart failure: Secondary | ICD-10-CM

## 2015-12-22 DIAGNOSIS — M6281 Muscle weakness (generalized): Secondary | ICD-10-CM | POA: Diagnosis not present

## 2015-12-22 DIAGNOSIS — R262 Difficulty in walking, not elsewhere classified: Secondary | ICD-10-CM | POA: Diagnosis not present

## 2015-12-22 DIAGNOSIS — I4891 Unspecified atrial fibrillation: Secondary | ICD-10-CM

## 2015-12-22 NOTE — Progress Notes (Signed)
Patient ID: Marco Niemeyer Sr., male    DOB: 1936-06-08, 80 y.o.   MRN: [id number]  HPI Comments: Marco [name] is a very pleasant 80 year old gentleman with a history of coronary artery disease, CABG, atrial fibrillation and DVT, on warfarin, diabetes, previous smoking history PVD, carotid endarterectomy, sick sinus syndrome who  presented to the hospital at [facility] after being seen by Dr. Richardson Landry of ear nose throat with increasing shortness of breath, oxygen saturations to the mid 80s on room air. Cardiology was consulted for systolic and diastolic CHF.  Pacemaker is followed by Dr. [name] He presents today for follow-up of his coronary artery disease He has LIMA to the LAD, vein graft to the circumflex, vein graft to the PL in June 2006 Also has mitral valve replacement with #28 Edwards logic ring S/p Pacemaker placement AAA repair May 2008. Baseline creatinine 2.5  In follow-up today, he presents in a wheelchair He is doing PT 2 days per week at home, walks with a walker Feels very weak after recent hospitalization March 2017 for gastroenteritis Denies any leg edema, weight has been stable. Reports having some burning on urination, previous urine culture was unrevealing. He has follow-up with Dr. Rosanna Randy May 8 He does report having some dizziness at times, in particular when he is seen by chiropractor and he goes from a supine to sitting position General he does not have dizziness at home Systolic blood pressure today 99/62. On other clinic visits, systolic pressure typically 120 up to 130 Denies any problems with hematuria  EKG on today's visit shows normal sinus rhythm, poor R-wave progression to the anterior precordial leads, left axis deviation Lab work reviewed showing her cholesterol 145, LDL 77 Hemoglobin A1c 6.8  Other past medical history Previous episodes of near syncope, lightheadedness, improved symptoms by holding his hydralazine and isosorbide  left hand weakness  started in December 2014.  carotid angiogram showed patent right carotid endarterectomy site, moderate disease on the left etiology of his symptoms was unclear. carotid disease monitored by Dr. Ronalee Belts.  Echocardiogram in the hospital 03/12/2012 showed moderate right ventricular pressures 50-60 mmHg consistent with moderate pulmonary hypertension, ejection fraction 30-35%, moderately dilated left atrium, dilated left ventricle.  Allergies  Allergen Reactions  . Shellfish Allergy Anaphylaxis    Outpatient Encounter Prescriptions as of 12/22/2015  Medication Sig  . ACCU-CHEK SMARTVIEW test strip   . amiodarone (PACERONE) 200 MG tablet take 1 tablet by mouth once daily  . apixaban (ELIQUIS) 5 MG TABS tablet Take 5 mg by mouth 2 (two) times daily.   . calcitRIOL (ROCALTROL) 0.5 MCG capsule Take 0.5 mcg by mouth daily.  . carvedilol (COREG) 3.125 MG tablet Take 3.125 mg by mouth daily.  . carvedilol (COREG) 6.25 MG tablet Take 1 tablet (6.25 mg total) by mouth every evening.  . cholecalciferol (VITAMIN D) 1000 UNITS tablet Take 1,000 Units by mouth daily.  [name] docusate sodium (COLACE) 100 MG capsule Take 100 mg by mouth 2 (two) times daily.    . finasteride (PROSCAR) 5 MG tablet Take 5 mg by mouth daily.  . furosemide (LASIX) 40 MG tablet Take 1 tablet (40 mg total) by mouth daily.  [name] HUMALOG MIX 75/25 (75-25) 100 UNIT/ML SUSP injection inject 5 to 10 units subcutaneously twice a day  . losartan (COZAAR) 25 MG tablet Take 25 mg by mouth daily.   . Multiple Vitamins-Minerals (MULTI-VITAMIN GUMMIES) CHEW Chew by mouth daily.  [name] omeprazole (PRILOSEC) 20 MG capsule take 1 capsule by mouth  once daily if needed  . ondansetron (ZOFRAN ODT) 4 MG disintegrating tablet Take 1 tablet (4 mg total) by mouth every 8 (eight) hours as needed for nausea or vomiting.  . rosuvastatin (CRESTOR) 20 MG tablet take 1 tablet by mouth at bedtime  . tamsulosin (FLOMAX) 0.4 MG CAPS capsule take 1 capsule by mouth once  daily  . [DISCONTINUED] furosemide (LASIX) 40 MG tablet take 1 tablet by mouth twice a day   No facility-administered encounter medications on file as of 12/22/2015.    Past Medical History  Diagnosis Date  . Mixed Ischemic and Non-ischemic Cardiomyopathy     a. 02/2012 Echo: EF 30-35%, Pulm HTN, and modest RV dysfunction;  b. 10/2012 TEE: EF 25-30%, mild conc LVH, diff HK, mod RV dysfxn, mod dil RA/LA, mild MR, no MS, nl MVR, mod TR, Ao sclerosis.  . Coronary artery disease     a. 1999 PCI RCA;  b. 01/2005 NSTEMI: CABG x 3 (LIMA->LAD, VG->OM, VG->LPL) @ time of MVR.  [name] Sick sinus syndrome ([place])     a. 01/2005 s/p PPM 2/2 heart block following CABG;  b. 05/2012 Gen Change: MDT Adapta ADDRL1 DC PPM, ser # [id number].  . Mitral regurgitation--s/p repair     a. 01/2005 MVR: 28-mm Edwards ET Logix ring annuloplasty; b. 10/2012 TEE: normal fxning MV ring with mild MR, no MS.  . Type II diabetes mellitus ([place])   . Hypertensive heart disease   . Hyperlipidemia   . BPH (benign prostatic hypertrophy)   . CKD (chronic kidney disease), stage IV ([place])   . GERD (gastroesophageal reflux disease)   . Chronic systolic heart failure ([place])     a. 02/2012 Echo: EF 30-35%, Pulm HTN, and modest RV dysfunction;  b. 10/2012 TEE: EF 25-30%, mild conc LVH, diff HK, mod RV dysfxn, mod dil RA/LA, mild MR, no MS, nl MVR, mod TR, Ao sclerosis.  . Obesity   . DVT (deep venous thrombosis) ([place])   . Anemia   . Sleep apnea     a. Intolerant of CPAP  . Basal cell carcinoma     a. 2013 x 2 back and left neck  . AAA (abdominal aortic aneurysm)repaired     a. 12/2006 s/p repair of 6cm infrarenal AAA with 69mm Dacron graft.  . Atrial fibrillation/Flutter     a. Dx 2014;  b. 10/2012 s/p TEE/DCCV;  c. On amio and Eliquis (CHA2DS2VASc = 6).  [name] PVC (premature ventricular contraction)     a. 02/2009 s/p RFCA ([place]).  . Carotid artery occlusion     a. 11/2008 s/p R CEA.  [name] History of shingles     Past Surgical History   Procedure Laterality Date  . Coronary artery bypass graft      CABG with a  LIMA to the LAD, SVG to circumflex, SVG to posterolateral June2006.  . Mitral valve repair  2006     mitral valve repair with a #28 Edwards logic ring  . Pacemaker placement    . Abdominal aortic aneurysm repair       May 2008,   . Cardiac catheterization  2006    @ [facility]  . Insert / replace / remove pacemaker  2006    Dr. [name]  . Skin cancer excision       x 6  . Cataract extraction      right  . Skin biopsy      left wrist; right elbow  . Transesophageal echocardiogram    .  Skin cancer excision    . Permanent pacemaker generator change N/A 06/26/2012    Procedure: PERMANENT PACEMAKER GENERATOR CHANGE;  Surgeon: Deboraha Sprang, MD;  Location: [facility] C[facility] LAB;  Service: Cardiovascular;  Laterality: N/A;  . Vasectomy      Social History  reports that he quit smoking about 28 years ago. His smoking use included Cigarettes. He has a 51 pack-year smoking history. He has never used smokeless tobacco. He reports that he drinks about 0.6 oz of alcohol per week. He reports that he does not use illicit drugs.  Family History family history includes Diabetes in his father, sister, and son; Heart attack in his sister, sister, and sister; Heart attack (age of onset: 67) in his brother; Heart attack (age of onset: 42) in his brother; Heart attack (age of onset: 42) in his brother; Heart attack (age of onset: 80) in his father; Heart attack (age of onset: 10) in his mother; Heart disease in his brother and father; Hypertension in his sister.   Review of Systems  Respiratory: Negative.   Cardiovascular: Negative.   Gastrointestinal: Negative.   Musculoskeletal: Positive for gait problem.  Neurological: Positive for weakness.  Hematological: Negative.   Psychiatric/Behavioral: Negative.   All other systems reviewed and are negative.   BP 99/62 mmHg  Pulse 79  Ht 6' (1.829 m)  Wt 265 lb (120.203 kg)  BMI 35.93  kg/m2  Physical Exam  Constitutional: He is oriented to person, place, and time. He appears well-developed and well-nourished.  Obese, presenting a wheelchair  HENT:  Head: Normocephalic.  Nose: Nose normal.  Mouth/Throat: Oropharynx is clear and moist.  Eyes: Conjunctivae are normal. Pupils are equal, round, and reactive to light.  Neck: Normal range of motion. Neck supple. No JVD present.  Cardiovascular: Normal rate, regular rhythm, S1 normal, S2 normal, normal heart sounds and intact distal pulses.  Exam reveals no gallop and no friction rub.   No murmur heard. Pulmonary/Chest: Effort normal and breath sounds normal. No respiratory distress. He has no wheezes. He has no rales. He exhibits no tenderness.  Abdominal: Soft. Bowel sounds are normal. He exhibits no distension. There is no tenderness.  Musculoskeletal: Normal range of motion. He exhibits no edema or tenderness.  Lymphadenopathy:    He has no cervical adenopathy.  Neurological: He is alert and oriented to person, place, and time. Coordination normal.  Skin: Skin is warm and dry. No rash noted. No erythema.  Psychiatric: He has a normal mood and affect. His behavior is normal. Judgment and thought content normal.      Assessment and Plan   Nursing note and vitals reviewed.

## 2015-12-22 NOTE — Assessment & Plan Note (Signed)
Currently with no symptoms of angina. No further workup at this time. Continue current medication regimen. 

## 2015-12-22 NOTE — Assessment & Plan Note (Signed)
On anticoagulation, no hematuria, maintaining normal sinus rhythm He is on amiodarone, carvedilol twice a day

## 2015-12-22 NOTE — Patient Instructions (Signed)
You are doing well.  Consider moving the losartan to the morning  If you get dizzy, consider decreasing the coreg down to 3.125 mg twice a day  Please call us if you have new issues that need to be addressed before your next appt.  Your physician wants you to follow-up in: 6 months.  You will receive a reminder letter in the mail two months in advance. If you don't receive a letter, please call our office to schedule the follow-up appointment.

## 2015-12-22 NOTE — Assessment & Plan Note (Signed)
Prior ejection fraction by TEE several years ago was 25-30%, Appears relatively euvolemic on today's visit. In fact given renal dysfunction, unable to exclude prerenal state. He takes Lasix 40 mg daily. Monitored by nephrology. Started on losartan 25 mill grams daily.   Total encounter time more than 25 minutes  Greater than 50% was spent in counseling and coordination of care with the patient

## 2015-12-22 NOTE — Assessment & Plan Note (Signed)
He continues to have mild postural hypotension symptoms, Periodically blood pressure less than [id number] systolic Seems to happen more in the evenings, possibly related to his Flomax. Recommended he try to take his losartan in the morning. If symptoms persist, would decrease carvedilol down to 3.125 mg twice a day. He currently takes 6.25 mg in the evening

## 2015-12-23 ENCOUNTER — Ambulatory Visit: Payer: Medicare Other | Admitting: Cardiovascular Disease

## 2015-12-24 DIAGNOSIS — R262 Difficulty in walking, not elsewhere classified: Secondary | ICD-10-CM | POA: Diagnosis not present

## 2015-12-24 DIAGNOSIS — M6281 Muscle weakness (generalized): Secondary | ICD-10-CM | POA: Diagnosis not present

## 2015-12-28 DIAGNOSIS — R262 Difficulty in walking, not elsewhere classified: Secondary | ICD-10-CM | POA: Diagnosis not present

## 2015-12-28 DIAGNOSIS — M6281 Muscle weakness (generalized): Secondary | ICD-10-CM | POA: Diagnosis not present

## 2015-12-31 DIAGNOSIS — M6281 Muscle weakness (generalized): Secondary | ICD-10-CM | POA: Diagnosis not present

## 2015-12-31 DIAGNOSIS — R262 Difficulty in walking, not elsewhere classified: Secondary | ICD-10-CM | POA: Diagnosis not present

## 2016-01-04 ENCOUNTER — Ambulatory Visit (INDEPENDENT_AMBULATORY_CARE_PROVIDER_SITE_OTHER): Payer: Medicare Other | Admitting: *Deleted

## 2016-01-04 ENCOUNTER — Telehealth: Payer: Self-pay | Admitting: Cardiology

## 2016-01-04 DIAGNOSIS — I495 Sick sinus syndrome: Secondary | ICD-10-CM

## 2016-01-04 NOTE — Telephone Encounter (Signed)
Spoke with pt and reminded pt of remote transmission that is due today. Pt verbalized understanding.   

## 2016-01-05 NOTE — Progress Notes (Signed)
Remote pacemaker transmission.   

## 2016-01-11 DIAGNOSIS — I509 Heart failure, unspecified: Secondary | ICD-10-CM | POA: Diagnosis not present

## 2016-01-14 ENCOUNTER — Other Ambulatory Visit: Payer: Self-pay | Admitting: Internal Medicine

## 2016-01-17 DIAGNOSIS — I6523 Occlusion and stenosis of bilateral carotid arteries: Secondary | ICD-10-CM | POA: Diagnosis not present

## 2016-01-18 DIAGNOSIS — M5136 Other intervertebral disc degeneration, lumbar region: Secondary | ICD-10-CM | POA: Diagnosis not present

## 2016-01-18 DIAGNOSIS — M5033 Other cervical disc degeneration, cervicothoracic region: Secondary | ICD-10-CM | POA: Diagnosis not present

## 2016-01-18 DIAGNOSIS — M9901 Segmental and somatic dysfunction of cervical region: Secondary | ICD-10-CM | POA: Diagnosis not present

## 2016-01-18 DIAGNOSIS — M9903 Segmental and somatic dysfunction of lumbar region: Secondary | ICD-10-CM | POA: Diagnosis not present

## 2016-01-31 ENCOUNTER — Ambulatory Visit: Payer: Medicare Other | Admitting: Urology

## 2016-02-09 ENCOUNTER — Encounter: Payer: Self-pay | Admitting: Cardiology

## 2016-02-09 LAB — CUP PACEART REMOTE DEVICE CHECK
Battery Impedance: 204 Ohm
Battery Remaining Longevity: 112 mo
Brady Statistic AS VS Percent: 0 %
Date Time Interrogation Session: 20170509224841
Implantable Lead Implant Date: 20060615
Implantable Lead Implant Date: 20060615
Implantable Lead Location: 753859
Lead Channel Impedance Value: 454 Ohm
Lead Channel Sensing Intrinsic Amplitude: 8 mV
Lead Channel Setting Pacing Amplitude: 2 V
Lead Channel Setting Pacing Pulse Width: 0.4 ms
MDC IDC LEAD LOCATION: 753860
MDC IDC MSMT BATTERY VOLTAGE: 2.79 V
MDC IDC MSMT LEADCHNL RA PACING THRESHOLD AMPLITUDE: 0.5 V
MDC IDC MSMT LEADCHNL RA PACING THRESHOLD PULSEWIDTH: 0.4 ms
MDC IDC MSMT LEADCHNL RV IMPEDANCE VALUE: 439 Ohm
MDC IDC MSMT LEADCHNL RV PACING THRESHOLD AMPLITUDE: 1.125 V
MDC IDC MSMT LEADCHNL RV PACING THRESHOLD PULSEWIDTH: 0.4 ms
MDC IDC SET LEADCHNL RV PACING AMPLITUDE: 2.5 V
MDC IDC SET LEADCHNL RV SENSING SENSITIVITY: 5.6 mV
MDC IDC STAT BRADY AP VP PERCENT: 4 %
MDC IDC STAT BRADY AP VS PERCENT: 94 %
MDC IDC STAT BRADY AS VP PERCENT: 1 %

## 2016-02-11 DIAGNOSIS — I509 Heart failure, unspecified: Secondary | ICD-10-CM | POA: Diagnosis not present

## 2016-02-22 ENCOUNTER — Encounter: Payer: Self-pay | Admitting: Family Medicine

## 2016-02-22 ENCOUNTER — Ambulatory Visit (INDEPENDENT_AMBULATORY_CARE_PROVIDER_SITE_OTHER): Payer: Medicare Other | Admitting: Family Medicine

## 2016-02-22 VITALS — BP 116/82 | HR 80 | Temp 98.9°F | Resp 12 | Ht 72.0 in | Wt 268.0 lb

## 2016-02-22 DIAGNOSIS — Z1211 Encounter for screening for malignant neoplasm of colon: Secondary | ICD-10-CM | POA: Diagnosis not present

## 2016-02-22 DIAGNOSIS — Z0000 Encounter for general adult medical examination without abnormal findings: Secondary | ICD-10-CM

## 2016-02-22 NOTE — Progress Notes (Signed)
Visit Date: 02/22/2016  Today's Provider: Wilhemena Durie, MD   Chief Complaint  Patient presents with  . Medicare Wellness   Subjective:   Marco Andreoni Sr. is a 80 y.o. male who presents today for his Subsequent Annual Wellness Visit. He feels fairly well. He reports exercising no. He reports he is sleeping poorly. Immunization History  Administered Date(s) Administered  . Influenza Split 06/28/2012  . Influenza,inj,Quad PF,36+ Mos 06/21/2015  . Influenza-Unspecified 05/28/2013  . Pneumococcal Conjugate-13 06/28/2012  . Pneumococcal Polysaccharide-23 11/24/2014   Patient states he has had 2 colonoscopy at least 5 years ago around 2012, no records at this time. Review of Systems  Constitutional: Positive for fatigue.  HENT: Positive for sinus pressure and sneezing.   Eyes: Negative.   Respiratory: Positive for shortness of breath.   Cardiovascular: Negative.   Gastrointestinal: Negative.   Endocrine: Negative.   Genitourinary: Negative.   Musculoskeletal: Positive for gait problem.  Skin: Negative.   Allergic/Immunologic: Negative.   Neurological: Positive for weakness.  Hematological: Negative.   Psychiatric/Behavioral: Negative.     Patient Active Problem List   Diagnosis Date Noted  . Chest pain 10/28/2015  . Gastroenteritis   . Diarrhea of infectious origin   . Emesis   . S/P CABG (coronary artery bypass graft)   . Coronary artery disease involving native coronary artery of native heart without angina pectoris   . PAF (paroxysmal atrial fibrillation) ([place])   . Absolute anemia 01/28/2015  . Atherosclerosis of coronary artery 01/28/2015  . Barrett's esophagus 01/28/2015  . Basal cell carcinoma of face 01/28/2015  . Benign fibroma of prostate 01/28/2015  . CCF (congestive cardiac failure) ([place]) 01/28/2015  . Diabetes ([place]) 01/28/2015  . Gastro-esophageal reflux disease without esophagitis 01/28/2015  . Irritable colon 01/28/2015  . Cannot sleep  01/28/2015  . Pleural cavity effusion 01/28/2015  . Apnea, sleep 01/28/2015  . Hematuria 12/21/2014  . Left hand weakness 09/23/2013  . Essential hypertension 06/23/2013  . Chronic systolic heart failure ([place]) 06/10/2013  . Hypotension, postural 06/10/2013  . Hyperkalemia 06/10/2013  . Adjustment of cardiac pacemaker 06/10/2013  . Atrial fibrillation and flutter ([place]) 10/22/2012  . Pacemaker-Medtronic 06/19/2012  . Pulmonary hypertension ([place]) 03/18/2012  . Occlusion and stenosis of carotid artery without mention of cerebral infarction 02/08/2012  . Morbid obesity ([place]) 08/05/2010  . Sinoatrial node dysfunction ([facility]) 06/02/2010  . Stage 4 chronic renal impairment associated with type 2 diabetes mellitus ([place]) 08/09/2009  . AAA 04/21/2009  . Ischemic cardiomyopathy  s/p CABG 11/23/2008  . MITRAL REGURGITATION 11/22/2008  . CAD (coronary artery disease) 11/22/2008  . Hyperlipidemia 03/02/2008  . ALLERGIC RHINITIS 03/02/2008  . OSA (obstructive sleep apnea) 03/02/2008    Social History   Social History  . Marital Status: Married    Spouse Name: N/A  . Number of Children: N/A  . Years of Education: N/A   Occupational History  . Retired     Engineer, structural in [place]  .     Social History Main Topics  . Smoking status: Former Smoker -- 1.50 packs/day for 34 years    Types: Cigarettes    Quit date: 08/28/1987  . Smokeless tobacco: Never Used  . Alcohol Use: 0.6 oz/week    1 Cans of beer per week     Comment: Occasionally but none since his surgery in June  . Drug Use: No  . Sexual Activity: No   Other Topics Concern  . Not on file   Social History  Narrative    Past Surgical History  Procedure Laterality Date  . Coronary artery bypass graft      CABG with a  LIMA to the LAD, SVG to circumflex, SVG to posterolateral June2006.  . Mitral valve repair  2006     mitral valve repair with a #28 Edwards logic ring  . Pacemaker placement    . Abdominal aortic aneurysm  repair       May 2008,   . Cardiac catheterization  2006    @ [facility]  . Insert / replace / remove pacemaker  2006    Dr. [name]  . Skin cancer excision       x 6  . Cataract extraction      right  . Skin biopsy      left wrist; right elbow  . Transesophageal echocardiogram    . Skin cancer excision    . Permanent pacemaker generator change N/A 06/26/2012    Procedure: PERMANENT PACEMAKER GENERATOR CHANGE;  Surgeon: Deboraha Sprang, MD;  Location: [facility] CATH LAB;  Service: Cardiovascular;  Laterality: N/A;  . Vasectomy      His family history includes Diabetes in his father, sister, and son; Heart attack in his sister, sister, and sister; Heart attack (age of onset: 48) in his brother; Heart attack (age of onset: 10) in his brother; Heart attack (age of onset: 62) in his brother; Heart attack (age of onset: 51) in his father; Heart attack (age of onset: 77) in his mother; Heart disease in his brother and father; Hypertension in his sister.    Outpatient Prescriptions Prior to Visit  Medication Sig Dispense Refill  . ACCU-CHEK SMARTVIEW test strip   1  . amiodarone (PACERONE) 200 MG tablet take 1 tablet by mouth once daily 30 tablet 3  . apixaban (ELIQUIS) 5 MG TABS tablet Take 5 mg by mouth 2 (two) times daily.     . calcitRIOL (ROCALTROL) 0.5 MCG capsule Take 0.5 mcg by mouth daily.    . carvedilol (COREG) 3.125 MG tablet Take 3.125 mg by mouth daily.    . carvedilol (COREG) 6.25 MG tablet Take 1 tablet (6.25 mg total) by mouth every evening. 60 tablet 3  . cholecalciferol (VITAMIN D) 1000 UNITS tablet Take 1,000 Units by mouth daily.    [name] docusate sodium (COLACE) 100 MG capsule Take 100 mg by mouth 2 (two) times daily.      [name] ELIQUIS 5 MG TABS tablet take 1 tablet by mouth twice a day 60 tablet 11  . finasteride (PROSCAR) 5 MG tablet Take 5 mg by mouth daily.    . furosemide (LASIX) 40 MG tablet Take 1 tablet (40 mg total) by mouth daily. 60 tablet 12  . HUMALOG MIX 75/25 (75-25) 100  UNIT/ML SUSP injection inject 5 to 10 units subcutaneously twice a day 10 vial 5  . losartan (COZAAR) 25 MG tablet Take 25 mg by mouth daily.   0  . Multiple Vitamins-Minerals (MULTI-VITAMIN GUMMIES) CHEW Chew by mouth daily.    [name] omeprazole (PRILOSEC) 20 MG capsule take 1 capsule by mouth once daily if needed 30 capsule 12  . rosuvastatin (CRESTOR) 20 MG tablet take 1 tablet by mouth at bedtime 90 tablet 3  . tamsulosin (FLOMAX) 0.4 MG CAPS capsule take 1 capsule by mouth once daily 30 capsule 12  . ondansetron (ZOFRAN ODT) 4 MG disintegrating tablet Take 1 tablet (4 mg total) by mouth every 8 (eight) hours as needed for nausea or  vomiting. 12 tablet 0   No facility-administered medications prior to visit.    Allergies  Allergen Reactions  . Shellfish Allergy Anaphylaxis    Patient Care Team: [name], MD as PCP - General (Unknown Physician Specialty) Minus Breeding, MD (Cardiology) Minna Merritts, MD as Consulting Physician (Cardiology)  Objective:   Vitals:  Filed Vitals:   02/22/16 0947  BP: 116/82  Pulse: 80  Temp: 98.9 F (37.2 C)  Resp: 12  Height: 6' (1.829 m)  Weight: 268 lb (121.564 kg)    Physical Exam  Constitutional: He is oriented to person, place, and time. He appears well-developed and well-nourished.  HENT:  Head: Normocephalic and atraumatic.  Right Ear: External ear normal.  Left Ear: External ear normal.  Nose: Nose normal.  Eyes: Conjunctivae are normal.  Neck: Neck supple.  Cardiovascular: Normal rate, regular rhythm and normal heart sounds.   Pulmonary/Chest: Effort normal and breath sounds normal.  Abdominal: Soft.  Musculoskeletal: He exhibits edema.  Trace edema with support hose.  Neurological: He is alert and oriented to person, place, and time.  Skin: Skin is warm and dry.  Psychiatric: He has a normal mood and affect. His behavior is normal. Judgment and thought content normal.    Activities of Daily Living In your  present state of health, do you have any difficulty performing the following activities: 02/22/2016 10/28/2015  Hearing? Y N  Vision? Y N  Difficulty concentrating or making decisions? Y N  Walking or climbing stairs? Y Y  Dressing or bathing? N N  Doing errands, shopping? Y N    Fall Risk Assessment Fall Risk  02/22/2016  Falls in the past year? No     Depression Screen PHQ 2/9 Scores 02/22/2016  PHQ - 2 Score 3  PHQ- 9 Score 9    Cognitive Testing - 6-CIT    Year: 0 4 points  Month: 0 3 points  Memorize "Pia Mau, [address], [place]"  Time (within 1 hour:) 0 3 points  Count backwards from 20: 0 2 4 points  Name months of year: 0 2 4 points  Repeat Address: 0 2 4 6 8 10  points   Total Score: 4/28  Interpretation : Normal (0-7) Abnormal (8-28)    Assessment & Plan:     Annual Wellness Visit  Reviewed patient's Family Medical History Reviewed and updated list of patient's medical providers Assessment of cognitive impairment was done Assessed patient's functional ability Established a written schedule for health screening [place] Completed and Reviewed  After review patient [facility].. Due to his overall poor health will not pursue any more screening colonoscopies. He has an ischemic cardiomyopathy with an EF of 30 at 35%. Everything appears to be stable today. His fall risk is elevated due to his size. Told him to use his walker 100% of the time on the even at home and his own bathroom. There is nothing else to stop him from falling other than not getting  up at all. He is morbidly obese and has never been willing to change his dietary habits. Discussed health benefits of physical activity, and encouraged him to engage in regular exercise appropriate for his age and condition.    Marco Aschoff MD [facility] 02/22/2016 9:50  AM  ------------------------------------------------------------------------------------------------------------

## 2016-02-23 ENCOUNTER — Encounter: Payer: Self-pay | Admitting: Cardiology

## 2016-02-23 DIAGNOSIS — M9903 Segmental and somatic dysfunction of lumbar region: Secondary | ICD-10-CM | POA: Diagnosis not present

## 2016-02-23 DIAGNOSIS — M5033 Other cervical disc degeneration, cervicothoracic region: Secondary | ICD-10-CM | POA: Diagnosis not present

## 2016-02-23 DIAGNOSIS — M5136 Other intervertebral disc degeneration, lumbar region: Secondary | ICD-10-CM | POA: Diagnosis not present

## 2016-02-23 DIAGNOSIS — M9901 Segmental and somatic dysfunction of cervical region: Secondary | ICD-10-CM | POA: Diagnosis not present

## 2016-03-06 DIAGNOSIS — I10 Essential (primary) hypertension: Secondary | ICD-10-CM | POA: Diagnosis not present

## 2016-03-06 DIAGNOSIS — E1122 Type 2 diabetes mellitus with diabetic chronic kidney disease: Secondary | ICD-10-CM | POA: Diagnosis not present

## 2016-03-06 DIAGNOSIS — R809 Proteinuria, unspecified: Secondary | ICD-10-CM | POA: Diagnosis not present

## 2016-03-06 DIAGNOSIS — N2581 Secondary hyperparathyroidism of renal origin: Secondary | ICD-10-CM | POA: Diagnosis not present

## 2016-03-06 DIAGNOSIS — Z940 Kidney transplant status: Secondary | ICD-10-CM | POA: Diagnosis not present

## 2016-03-06 DIAGNOSIS — N184 Chronic kidney disease, stage 4 (severe): Secondary | ICD-10-CM | POA: Diagnosis not present

## 2016-03-06 DIAGNOSIS — R600 Localized edema: Secondary | ICD-10-CM | POA: Diagnosis not present

## 2016-03-07 DIAGNOSIS — N2581 Secondary hyperparathyroidism of renal origin: Secondary | ICD-10-CM | POA: Diagnosis not present

## 2016-03-07 DIAGNOSIS — N184 Chronic kidney disease, stage 4 (severe): Secondary | ICD-10-CM | POA: Diagnosis not present

## 2016-03-07 DIAGNOSIS — I10 Essential (primary) hypertension: Secondary | ICD-10-CM | POA: Diagnosis not present

## 2016-03-12 DIAGNOSIS — I509 Heart failure, unspecified: Secondary | ICD-10-CM | POA: Diagnosis not present

## 2016-03-15 DIAGNOSIS — B351 Tinea unguium: Secondary | ICD-10-CM | POA: Diagnosis not present

## 2016-03-15 DIAGNOSIS — Z794 Long term (current) use of insulin: Secondary | ICD-10-CM | POA: Diagnosis not present

## 2016-03-15 DIAGNOSIS — E1140 Type 2 diabetes mellitus with diabetic neuropathy, unspecified: Secondary | ICD-10-CM | POA: Diagnosis not present

## 2016-03-21 DIAGNOSIS — M9903 Segmental and somatic dysfunction of lumbar region: Secondary | ICD-10-CM | POA: Diagnosis not present

## 2016-03-21 DIAGNOSIS — M9901 Segmental and somatic dysfunction of cervical region: Secondary | ICD-10-CM | POA: Diagnosis not present

## 2016-03-21 DIAGNOSIS — M5033 Other cervical disc degeneration, cervicothoracic region: Secondary | ICD-10-CM | POA: Diagnosis not present

## 2016-03-21 DIAGNOSIS — M5136 Other intervertebral disc degeneration, lumbar region: Secondary | ICD-10-CM | POA: Diagnosis not present

## 2016-04-02 ENCOUNTER — Other Ambulatory Visit: Payer: Self-pay | Admitting: Family Medicine

## 2016-04-03 NOTE — Progress Notes (Signed)
Patient Care Team: [name], MD as PCP - General (Unknown Physician Specialty) Minus Breeding, MD (Cardiology) Minna Merritts, MD as Consulting Physician (Cardiology)   HPI  Marco Few Guettler Sr. is a 80 y.o. male Seen in follow-up for a pacemaker implanted for tachybradycardia syndrome. He has a history of atrial arrhythmias both fibrillation and flutter. He is treated with amiodarone and  apixoban   He has  ischemic cardiomyopathy with prior bypass surgery and PCI. He also has remote mitral valve repair.   Most recent ejection fraction at the time of TEE 2/14--25-30% with evidence of pulmonary hypertension and biatrial enlargement     he has a history of renal insufficiency. Most recent creatinine on our record is 2.53 down from 3.7   He is followed by  Nephrology here in [place].   2/17 TSH normal 3/17 AST 46  creatinine 2.53  Creatinine per his nephrologist recently is stable  He remains significant only short of breath. He uses oxygen at home but not while he walks. He uses a walker. Edema is stable. He uses support stockings. He is on ARB therapy   He was seen at [facility] 4/17 for increasing dyspnea and is been seen by Dr. Deidre Ala in the interim. These notes were reviewed     Past Medical History:  Diagnosis Date  . AAA (abdominal aortic aneurysm)repaired    a. 12/2006 s/p repair of 6cm infrarenal AAA with 56mm Dacron graft.  . Anemia   . Atrial fibrillation/Flutter    a. Dx 2014;  b. 10/2012 s/p TEE/DCCV;  c. On amio and Eliquis (CHA2DS2VASc = 6).  . Basal cell carcinoma    a. 2013 x 2 back and left neck  . BPH (benign prostatic hypertrophy)   . Carotid artery occlusion    a. 11/2008 s/p R CEA.  . Chronic systolic heart failure ([place])    a. 02/2012 Echo: EF 30-35%, Pulm HTN, and modest RV dysfunction;  b. 10/2012 TEE: EF 25-30%, mild conc LVH, diff HK, mod RV dysfxn, mod dil RA/LA, mild MR, no MS, nl MVR, mod TR, Ao sclerosis.  . CKD (chronic kidney  disease), stage IV ([place])   . Coronary artery disease    a. 1999 PCI RCA;  b. 01/2005 NSTEMI: CABG x 3 (LIMA->LAD, VG->OM, VG->LPL) @ time of MVR.  [name] DVT (deep venous thrombosis) ([place])   . GERD (gastroesophageal reflux disease)   . History of shingles   . Hyperlipidemia   . Hypertensive heart disease   . Mitral regurgitation--s/p repair    a. 01/2005 MVR: 28-mm Edwards ET Logix ring annuloplasty; b. 10/2012 TEE: normal fxning MV ring with mild MR, no MS.  . Mixed Ischemic and Non-ischemic Cardiomyopathy    a. 02/2012 Echo: EF 30-35%, Pulm HTN, and modest RV dysfunction;  b. 10/2012 TEE: EF 25-30%, mild conc LVH, diff HK, mod RV dysfxn, mod dil RA/LA, mild MR, no MS, nl MVR, mod TR, Ao sclerosis.  . Obesity   . PVC (premature ventricular contraction)    a. 02/2009 s/p RFCA ([place]).  . Sick sinus syndrome ([place])    a. 01/2005 s/p PPM 2/2 heart block following CABG;  b. 05/2012 Gen Change: MDT Adapta ADDRL1 DC PPM, ser # [id number].  . Sleep apnea    a. Intolerant of CPAP  . Type II diabetes mellitus ([place])     Past Surgical History:  Procedure Laterality Date  . ABDOMINAL AORTIC ANEURYSM REPAIR  May 2008,   . CARDIAC CATHETERIZATION  2006   @ [facility]  . CATARACT EXTRACTION     right  . CORONARY ARTERY BYPASS GRAFT     CABG with a  LIMA to the LAD, SVG to circumflex, SVG to posterolateral June2006.  Randolm Idol / REPLACE / REMOVE PACEMAKER  2006   Dr. [name]  . MITRAL VALVE REPAIR  2006    mitral valve repair with a #28 Edwards logic ring  . PACEMAKER PLACEMENT    . PERMANENT PACEMAKER GENERATOR CHANGE N/A 06/26/2012   Procedure: PERMANENT PACEMAKER GENERATOR CHANGE;  Surgeon: Deboraha Sprang, MD;  Location[facility] C[facility] LAB;  Service: Cardiovascular;  Laterality: N/A;  . SKIN BIOPSY     left wrist; right elbow  . SKIN CANCER EXCISION      x 6  . SKIN CANCER EXCISION    . TRANSESOPHAGEAL ECHOCARDIOGRAM    . VASECTOMY      Current Outpatient Prescriptions  Medication Sig Dispense Refill    . ACCU-CHEK SMARTVIEW test strip   1  . amiodarone (PACERONE) 200 MG tablet take 1 tablet by mouth once daily 30 tablet 3  . apixaban (ELIQUIS) 5 MG TABS tablet Take 5 mg by mouth 2 (two) times daily.     . BD INSULIN SYRINGE ULTRAFINE 31G X 5/16" 0.3 ML MISC   1  . BD INSULIN SYRINGE ULTRAFINE 31G X 5/16" 0.3 ML MISC use twice a day as directed 100 each 12  . calcitRIOL (ROCALTROL) 0.5 MCG capsule Take 0.5 mcg by mouth daily.    . carvedilol (COREG) 3.125 MG tablet Take 3.125 mg by mouth daily.    . carvedilol (COREG) 6.25 MG tablet Take 1 tablet (6.25 mg total) by mouth every evening. 60 tablet 3  . cholecalciferol (VITAMIN D) 1000 UNITS tablet Take 1,000 Units by mouth daily.    [name] docusate sodium (COLACE) 100 MG capsule Take 100 mg by mouth 2 (two) times daily.      . finasteride (PROSCAR) 5 MG tablet Take 5 mg by mouth daily.    . furosemide (LASIX) 40 MG tablet Take 1 tablet (40 mg total) by mouth daily. 60 tablet 12  . HUMALOG MIX 75/25 (75-25) 100 UNIT/ML SUSP injection inject 5 to 10 units subcutaneously twice a day (Patient taking differently: inject 5 to 15 units subcutaneously twice a day) 10 vial 5  . losartan (COZAAR) 25 MG tablet Take 25 mg by mouth daily.   0  . Multiple Vitamins-Minerals (MULTI-VITAMIN GUMMIES) CHEW Chew by mouth daily.    [name] omeprazole (PRILOSEC) 20 MG capsule take 1 capsule by mouth once daily if needed 30 capsule 12  . rosuvastatin (CRESTOR) 20 MG tablet take 1 tablet by mouth at bedtime 90 tablet 3  . tamsulosin (FLOMAX) 0.4 MG CAPS capsule take 1 capsule by mouth once daily 30 capsule 12   No current facility-administered medications for this visit.     Allergies  Allergen Reactions  . Shellfish Allergy Anaphylaxis    Review of Systems negative except from HPI and PMH  Physical Exam BP 132/72 (BP Location: Left Arm, Patient Position: Sitting, Cuff Size: Large)   Pulse 83   Ht 6' (1.829 m)   Wt 268 lb 8 oz (121.8 kg)   BMI 36.42 kg/m  Well  developed and morbidly obese Caucasian male in no acute distress sitting in wheelchair HENT normal E scleral and icterus clear Neck Supple  Clear to ausculation Device pocket well healed; without  hematoma or erythema.  There is no tethering  regular rate and rhythm,   Soft with active bowel sounds No clubbing cyanosis 1+ Edema Alert and oriented, Skin Warm and Dry  ECG a pacing at 83 with intrinsic conduction Intervals 21/12/38 axis left -35     Assessment and  Plan  Atrial fibrillation/flutter  Chronotropic incompetence/sinus node dysfunction  Ischemic cardiomyopathy  Status post CABG  Congestive heart failure-chronic-systolic  Pacemaker-Medtronic The patient's device was interrogated.  The information was reviewed. No changes were made in the programming.     High risk medication surveillance  Dyspnea  Renal failure class 3-4  Hypertension   Blood pressure is well-controlled  Without symptoms of ischemia  With his dyspnea, I am concerned about amiodarone lung toxicity. We will obtain pulmonary function tests. We may need pulmonary consultation  I have also suggested that he use a 50 foot long cable for his oxygen so he can wear time as he desaturates when he walks  We will check amiodarone surveillance laboratories  With his edema, but his renal function will defer management of blood pressure as well as diuretics to his nephrologist

## 2016-04-04 ENCOUNTER — Encounter: Payer: Self-pay | Admitting: Internal Medicine

## 2016-04-04 ENCOUNTER — Ambulatory Visit (INDEPENDENT_AMBULATORY_CARE_PROVIDER_SITE_OTHER): Payer: Medicare Other | Admitting: Internal Medicine

## 2016-04-04 VITALS — BP 132/72 | HR 83 | Ht 72.0 in | Wt 268.5 lb

## 2016-04-04 DIAGNOSIS — I4892 Unspecified atrial flutter: Secondary | ICD-10-CM | POA: Diagnosis not present

## 2016-04-04 DIAGNOSIS — Z79899 Other long term (current) drug therapy: Secondary | ICD-10-CM

## 2016-04-04 DIAGNOSIS — I255 Ischemic cardiomyopathy: Secondary | ICD-10-CM

## 2016-04-04 DIAGNOSIS — I480 Paroxysmal atrial fibrillation: Secondary | ICD-10-CM | POA: Diagnosis not present

## 2016-04-04 DIAGNOSIS — I10 Essential (primary) hypertension: Secondary | ICD-10-CM

## 2016-04-04 DIAGNOSIS — I5022 Chronic systolic (congestive) heart failure: Secondary | ICD-10-CM | POA: Diagnosis not present

## 2016-04-04 DIAGNOSIS — Z950 Presence of cardiac pacemaker: Secondary | ICD-10-CM | POA: Diagnosis not present

## 2016-04-04 DIAGNOSIS — I495 Sick sinus syndrome: Secondary | ICD-10-CM | POA: Diagnosis not present

## 2016-04-04 NOTE — Patient Instructions (Addendum)
Medication Instructions: - Your physician recommends that you continue on your current medications as directed. Please refer to the Current Medication list given to you today.  Labwork: - Your physician recommends that you  lab work today: CBC/ Liver/ TSH  Procedures/Testing: - none  Follow-Up: - Your physician recommends that you schedule a follow-up appointment in: October with Dr. Rockey Situ  - Remote monitoring is used to monitor your Pacemaker of ICD from home. This monitoring reduces the number of office visits required to check your device to one time per year. It allows [place] to keep an eye on the functioning of your device to ensure it is working properly. You are scheduled for a device check from home on 07/04/16. You may send your transmission at any time that day. If you have a wireless device, the transmission will be sent automatically. After your physician reviews your transmission, you will receive a postcard with your next transmission date.  - Your physician wants you to follow-up in: 6 months with Dr. [name]. You will receive a reminder letter in the mail two months in advance. If you don't receive a letter, please call our office to schedule the follow-up appointment.  Any Additional Special Instructions Will Be Listed Below (If Applicable).     If you need a refill on your cardiac medications before your next appointment, please call your pharmacy.

## 2016-04-05 LAB — CBC WITH DIFFERENTIAL/PLATELET
BASOS ABS: 0 10*3/uL (ref 0.0–0.2)
Basos: 1 %
EOS (ABSOLUTE): 0.4 10*3/uL (ref 0.0–0.4)
Eos: 5 %
Hematocrit: 36.2 % — ABNORMAL LOW (ref 37.5–51.0)
Hemoglobin: 11.7 g/dL — ABNORMAL LOW (ref 12.6–17.7)
IMMATURE GRANS (ABS): 0 10*3/uL (ref 0.0–0.1)
IMMATURE GRANULOCYTES: 0 %
LYMPHS: 18 %
Lymphocytes Absolute: 1.2 10*3/uL (ref 0.7–3.1)
MCH: 30.3 pg (ref 26.6–33.0)
MCHC: 32.3 g/dL (ref 31.5–35.7)
MCV: 94 fL (ref 79–97)
MONOS ABS: 0.6 10*3/uL (ref 0.1–0.9)
Monocytes: 9 %
NEUTROS PCT: 67 %
Neutrophils Absolute: 4.7 10*3/uL (ref 1.4–7.0)
PLATELETS: 185 10*3/uL (ref 150–379)
RBC: 3.86 x10E6/uL — ABNORMAL LOW (ref 4.14–5.80)
RDW: 14.3 % (ref 12.3–15.4)
WBC: 6.9 10*3/uL (ref 3.4–10.8)

## 2016-04-05 LAB — HEPATIC FUNCTION PANEL
ALT: 16 IU/L (ref 0–44)
AST: 18 IU/L (ref 0–40)
Albumin: 3.6 g/dL (ref 3.5–4.8)
Alkaline Phosphatase: 51 IU/L (ref 39–117)
Bilirubin Total: 0.3 mg/dL (ref 0.0–1.2)
Bilirubin, Direct: 0.1 mg/dL (ref 0.00–0.40)
Total Protein: 7.1 g/dL (ref 6.0–8.5)

## 2016-04-05 LAB — TSH: TSH: 1.89 u[IU]/mL (ref 0.450–4.500)

## 2016-04-07 ENCOUNTER — Telehealth: Payer: Self-pay

## 2016-04-07 NOTE — Telephone Encounter (Signed)
Pt is are is aware of lab results and is agreeable to plan to send to Dr. Rosanna Randy Pt had no questions and understood clearly

## 2016-04-10 ENCOUNTER — Encounter: Payer: Self-pay | Admitting: Internal Medicine

## 2016-04-12 DIAGNOSIS — I509 Heart failure, unspecified: Secondary | ICD-10-CM | POA: Diagnosis not present

## 2016-04-18 DIAGNOSIS — M5033 Other cervical disc degeneration, cervicothoracic region: Secondary | ICD-10-CM | POA: Diagnosis not present

## 2016-04-18 DIAGNOSIS — M9903 Segmental and somatic dysfunction of lumbar region: Secondary | ICD-10-CM | POA: Diagnosis not present

## 2016-04-18 DIAGNOSIS — M9901 Segmental and somatic dysfunction of cervical region: Secondary | ICD-10-CM | POA: Diagnosis not present

## 2016-04-18 DIAGNOSIS — M5136 Other intervertebral disc degeneration, lumbar region: Secondary | ICD-10-CM | POA: Diagnosis not present

## 2016-05-02 ENCOUNTER — Telehealth: Payer: Self-pay | Admitting: Cardiovascular Disease

## 2016-05-02 MED ORDER — CARVEDILOL 3.125 MG PO TABS: 3.1250 mg | ORAL_TABLET | Freq: Two times a day (BID) | ORAL | Status: DC

## 2016-05-02 NOTE — Telephone Encounter (Signed)
Pt c/o BP issue: STAT if pt c/o blurred vision, one-sided weakness or slurred speech Pt called EMT on Saturday due to BP being low 109/54 1. What are your last 5 BP readings?  9/2-114/80-when EMT arrived 9/1-101/59 R arm 9/4-143/84 R arm States when he stands up his BP drops  2. Are you having any other symptoms (ex. Dizziness, headache, blurred vision, passed out)?   3. What is your BP issue? Too low  Pt wife also states he is having a "little " pain around his pacemaker pain level is 5. States when he sat down and has his oxygen, it eased off.

## 2016-05-02 NOTE — Telephone Encounter (Signed)
Remote transmission reviewed by [facility]. No abnormalities noted on remote check. Reviewed symptoms with Dr. [name]. Decrease coreg to 3.125 mg BID. Hold losartan. Will need follow up with the PA in [place] in the next day or so.  I have notified the patient's wife of Dr. Olin Pia recommendations. She is aware I will ask the [place] office to call her with a time to come in to see the PA. The patient should have a CBC/ BMP checked per Dr. [name].

## 2016-05-02 NOTE — Telephone Encounter (Signed)
I called and spoke with the patient. He states that he called EMS out on Saturday due to dizziness- BP was 114/84. He reports a low BP over the last several days- worse with standing- and symptoms of dizziness. BP was 97/50 prior to today. Today he has been 123/66, 99-57, 159/84 (around 11am). He reports symptoms of chest pain today around his PPM site. Per the patient, the site itself looks fine.  He has no radiating symptoms and denies palpitations.  He denies syncope. Medications reviewed with the patient- he has taken his morning medications as usual. I advised the patient to send a transmission in for review and I will review symptoms with Dr. [name] and call them back this afternoon.  The patient and his wife are agreeable.

## 2016-05-03 ENCOUNTER — Other Ambulatory Visit
Admission: RE | Admit: 2016-05-03 | Discharge: 2016-05-03 | Disposition: A | Discharge status: Home / Self Care | Payer: Medicare Other | Source: Ambulatory Visit | Attending: Cardiovascular Disease | Admitting: Cardiovascular Disease

## 2016-05-03 ENCOUNTER — Other Ambulatory Visit: Payer: Self-pay

## 2016-05-03 ENCOUNTER — Encounter: Payer: Self-pay | Admitting: Cardiovascular Disease

## 2016-05-03 ENCOUNTER — Ambulatory Visit (INDEPENDENT_AMBULATORY_CARE_PROVIDER_SITE_OTHER): Payer: Medicare Other | Admitting: Cardiovascular Disease

## 2016-05-03 VITALS — BP 80/50 | HR 80 | Ht 72.0 in | Wt 265.8 lb

## 2016-05-03 DIAGNOSIS — R5381 Other malaise: Secondary | ICD-10-CM | POA: Diagnosis not present

## 2016-05-03 DIAGNOSIS — I4892 Unspecified atrial flutter: Secondary | ICD-10-CM | POA: Diagnosis not present

## 2016-05-03 DIAGNOSIS — R399 Unspecified symptoms and signs involving the genitourinary system: Secondary | ICD-10-CM | POA: Diagnosis not present

## 2016-05-03 DIAGNOSIS — N179 Acute kidney failure, unspecified: Secondary | ICD-10-CM

## 2016-05-03 DIAGNOSIS — I4891 Unspecified atrial fibrillation: Secondary | ICD-10-CM

## 2016-05-03 DIAGNOSIS — I429 Cardiomyopathy, unspecified: Secondary | ICD-10-CM

## 2016-05-03 DIAGNOSIS — I959 Hypotension, unspecified: Secondary | ICD-10-CM

## 2016-05-03 DIAGNOSIS — N189 Chronic kidney disease, unspecified: Secondary | ICD-10-CM

## 2016-05-03 LAB — URINALYSIS COMPLETE WITH MICROSCOPIC (ARMC ONLY)
BACTERIA UA: NONE SEEN
BILIRUBIN URINE: NEGATIVE
Glucose, UA: 50 mg/dL — AB
Ketones, ur: NEGATIVE mg/dL
NITRITE: NEGATIVE
PH: 6 (ref 5.0–8.0)
Protein, ur: NEGATIVE mg/dL
SPECIFIC GRAVITY, URINE: 1.01 (ref 1.005–1.030)

## 2016-05-03 LAB — BASIC METABOLIC PANEL
Anion gap: 8 (ref 5–15)
BUN: 56 mg/dL — ABNORMAL HIGH (ref 6–20)
CHLORIDE: 98 mmol/L — AB (ref 101–111)
CO2: 36 mmol/L — AB (ref 22–32)
CREATININE: 3.08 mg/dL — AB (ref 0.61–1.24)
Calcium: 10 mg/dL (ref 8.9–10.3)
GFR calc non Af Amer: 18 mL/min — ABNORMAL LOW (ref >60)
GFR, EST AFRICAN AMERICAN: 21 mL/min — AB (ref >60)
Glucose, Bld: 131 mg/dL — ABNORMAL HIGH (ref 65–99)
POTASSIUM: 4.5 mmol/L (ref 3.5–5.1)
SODIUM: 142 mmol/L (ref 135–145)

## 2016-05-03 MED ORDER — FUROSEMIDE 40 MG PO TABS: 40.0000 mg | ORAL_TABLET | ORAL | 12 refills | Status: DC
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
## 2016-05-03 NOTE — Patient Instructions (Addendum)
Medication Instructions:   Please hold the lasix for now Continue to hold losartan Dont take coreg for blood pressure less than 110  Labwork:  We  Will check urine analysis and urine culture, Also a BMP  Testing/Procedures:  We will schedule an echocardiogram for hypotension, malaise, cardiomyopathy Echocardiography is a painless test that uses sound waves to create images of your heart. It provides your doctor with information about the size and shape of your heart and how well your heart's chambers and valves are working. This procedure takes approximately one hour. There are no restrictions for this procedure.  Follow-Up: It was a pleasure seeing you in the office today. Please call us if you have new issues that need to be addressed before your next appt.  [phone]  Your physician wants you to follow-up in: 1 month.   Recommended he continue to hold losartan  If you need a refill on your cardiac medications before your next appointment, please call your pharmacy.    Echocardiogram An echocardiogram, or echocardiography, uses sound waves (ultrasound) to produce an image of your heart. The echocardiogram is simple, painless, obtained within a short period of time, and offers valuable information to your health care provider. The images from an echocardiogram can provide information such as:  Evidence of coronary artery disease (CAD).  Heart size.  Heart muscle function.  Heart valve function.  Aneurysm detection.  Evidence of a past heart attack.  Fluid buildup around the heart.  Heart muscle thickening.  Assess heart valve function. LET [facility] CARE PROVIDER KNOW ABOUT:  Any allergies you have.  All medicines you are taking, including vitamins, herbs, eye drops, creams, and over-the-counter medicines.  Previous problems you or members of your family have had with the use of anesthetics.  Any blood disorders you have.  Previous surgeries you have  had.  Medical conditions you have.  Possibility of pregnancy, if this applies. BEFORE THE PROCEDURE  No special preparation is needed. Eat and drink normally.  PROCEDURE   In order to produce an image of your heart, gel will be applied to your chest and a wand-like tool (transducer) will be moved over your chest. The gel will help transmit the sound waves from the transducer. The sound waves will harmlessly bounce off your heart to allow the heart images to be captured in real-time motion. These images will then be recorded.  You may need an IV to receive a medicine that improves the quality of the pictures. AFTER THE PROCEDURE You may return to your normal schedule including diet, activities, and medicines, unless your health care provider tells you otherwise.   This information is not intended to replace advice given to you by your health care provider. Make sure you discuss any questions you have with your health care provider.   Document Released: 08/11/2000 Document Revised: 09/04/2014 Document Reviewed: 04/21/2013 Elsevier Interactive Patient Education [employer].

## 2016-05-04 LAB — URINE CULTURE

## 2016-05-05 LAB — CUP PACEART INCLINIC DEVICE CHECK
Implantable Lead Implant Date: 20060615
Implantable Lead Location: 753860
MDC IDC LEAD IMPLANT DT: 20060615
MDC IDC LEAD LOCATION: 753859
MDC IDC SESS DTM: 20170908164533

## 2016-05-07 ENCOUNTER — Other Ambulatory Visit: Payer: Self-pay | Admitting: Cardiovascular Disease

## 2016-05-13 DIAGNOSIS — I509 Heart failure, unspecified: Secondary | ICD-10-CM | POA: Diagnosis not present

## 2016-05-16 DIAGNOSIS — M9901 Segmental and somatic dysfunction of cervical region: Secondary | ICD-10-CM | POA: Diagnosis not present

## 2016-05-16 DIAGNOSIS — M5136 Other intervertebral disc degeneration, lumbar region: Secondary | ICD-10-CM | POA: Diagnosis not present

## 2016-05-16 DIAGNOSIS — M5033 Other cervical disc degeneration, cervicothoracic region: Secondary | ICD-10-CM | POA: Diagnosis not present

## 2016-05-16 DIAGNOSIS — M9903 Segmental and somatic dysfunction of lumbar region: Secondary | ICD-10-CM | POA: Diagnosis not present

## 2016-05-23 ENCOUNTER — Other Ambulatory Visit: Payer: Self-pay

## 2016-05-23 ENCOUNTER — Ambulatory Visit (INDEPENDENT_AMBULATORY_CARE_PROVIDER_SITE_OTHER): Payer: Medicare Other

## 2016-05-23 DIAGNOSIS — R5381 Other malaise: Secondary | ICD-10-CM

## 2016-05-23 DIAGNOSIS — I959 Hypotension, unspecified: Secondary | ICD-10-CM

## 2016-05-23 DIAGNOSIS — I4892 Unspecified atrial flutter: Secondary | ICD-10-CM

## 2016-05-23 DIAGNOSIS — I4891 Unspecified atrial fibrillation: Secondary | ICD-10-CM

## 2016-05-23 DIAGNOSIS — I429 Cardiomyopathy, unspecified: Secondary | ICD-10-CM | POA: Diagnosis not present

## 2016-05-29 ENCOUNTER — Ambulatory Visit (INDEPENDENT_AMBULATORY_CARE_PROVIDER_SITE_OTHER): Payer: Medicare Other | Admitting: Family Medicine

## 2016-05-29 VITALS — BP 104/52 | HR 80 | Temp 98.0°F | Resp 18

## 2016-05-29 DIAGNOSIS — I255 Ischemic cardiomyopathy: Secondary | ICD-10-CM | POA: Diagnosis not present

## 2016-05-29 DIAGNOSIS — I951 Orthostatic hypotension: Secondary | ICD-10-CM

## 2016-05-29 DIAGNOSIS — I10 Essential (primary) hypertension: Secondary | ICD-10-CM

## 2016-05-29 DIAGNOSIS — N184 Chronic kidney disease, stage 4 (severe): Secondary | ICD-10-CM | POA: Diagnosis not present

## 2016-05-29 DIAGNOSIS — E785 Hyperlipidemia, unspecified: Secondary | ICD-10-CM | POA: Diagnosis not present

## 2016-05-29 DIAGNOSIS — E1121 Type 2 diabetes mellitus with diabetic nephropathy: Secondary | ICD-10-CM | POA: Diagnosis not present

## 2016-05-29 DIAGNOSIS — Z23 Encounter for immunization: Secondary | ICD-10-CM

## 2016-05-29 NOTE — Progress Notes (Signed)
Marco Jaci Standard Tunney Sr.  MRN: [id number] DOB: 1936/01/15  Subjective:  HPI   The patient is a 80 year old male who presents for follow up of his chronic conditions.  He was last seen on 11/22/15.  His A1C was done at that time and it was 6.5.  He checks his glucose regularly and he has been getting range of 8-147.  He states he gets some symptoms associated with hypoglycemia.  He has had 2 episodes in the last month where he got hot and sweaty.  He has not checked his glucose during these episodes and said that he did not have any dizziness associated or near syncope associated with the episode. Lab Results  Component Value Date   HGBA1C 6.5 11/22/2015    The patient is also here for his hypertension check.  He states that his BP has been doing well.  He checks it regularly at home and has been getting readings of 120-130 over 70's with an occasional low pressure.  He states that when it is low he does feel weak and washed out. Lab Results  Component Value Date   CREATININE 3.08 (H) 05/03/2016   BUN 56 (H) 05/03/2016   NA 142 05/03/2016   K 4.5 05/03/2016   CL 98 (L) 05/03/2016   CO2 36 (H) 05/03/2016   Lab Results  Component Value Date   CREATININE 3.08 (H) 05/03/2016   CREATININE 2.53 (H) 10/30/2015   CREATININE 2.62 (H) 10/29/2015     Patient is also due to have his lipids checked today.  His last test was in August 2016.  Lab Results  Component Value Date   CHOL 145 04/09/2015   CHOL 140 07/02/2014   CHOL 149 09/23/2013   Lab Results  Component Value Date   HDL 47 04/09/2015   HDL 45 07/02/2014   HDL 42 09/23/2013   Lab Results  Component Value Date   LDLCALC 77 04/09/2015   LDLCALC 74 07/02/2014   LDLCALC 92 09/23/2013   Lab Results  Component Value Date   TRIG 104 04/09/2015   TRIG 105 07/02/2014   TRIG 76 09/23/2013   Lab Results  Component Value Date   CHOLHDL 3.5 09/23/2013   No results found for: LDLDIRECT   Patient also states that he has  been experiencing some blood in his urine after having an episode where he had slight trauma to his genitals.  The patient was at the beach and when he went to sit in the hot tub he hit the seat hard and had pain and blood in his urine.  He states it is better now.  Patient Active Problem List   Diagnosis Date Noted  . Chest pain 10/28/2015  . Gastroenteritis   . Diarrhea of infectious origin   . Emesis   . S/P CABG (coronary artery bypass graft)   . Coronary artery disease involving native coronary artery of native heart without angina pectoris   . PAF (paroxysmal atrial fibrillation) ([place])   . Absolute anemia 01/28/2015  . Atherosclerosis of coronary artery 01/28/2015  . Barrett's esophagus 01/28/2015  . Basal cell carcinoma of face 01/28/2015  . Benign fibroma of prostate 01/28/2015  . CCF (congestive cardiac failure) ([place]) 01/28/2015  . Diabetes ([place]) 01/28/2015  . Gastro-esophageal reflux disease without esophagitis 01/28/2015  . Irritable colon 01/28/2015  . Cannot sleep 01/28/2015  . Pleural cavity effusion 01/28/2015  . Apnea, sleep 01/28/2015  . Hematuria 12/21/2014  . Left hand weakness 09/23/2013  .  Essential hypertension 06/23/2013  . Chronic systolic heart failure ([place]) 06/10/2013  . Hypotension, postural 06/10/2013  . Hyperkalemia 06/10/2013  . Adjustment of cardiac pacemaker 06/10/2013  . Atrial fibrillation and flutter ([place]) 10/22/2012  . Pacemaker-Medtronic 06/19/2012  . Pulmonary hypertension 03/18/2012  . Occlusion and stenosis of carotid artery without mention of cerebral infarction 02/08/2012  . Morbid obesity ([place]) 08/05/2010  . Sinoatrial node dysfunction (HCC) 06/02/2010  . Stage 4 chronic renal impairment associated with type 2 diabetes mellitus ([place]) 08/09/2009  . AAA 04/21/2009  . Ischemic cardiomyopathy  s/p CABG 11/23/2008  . MITRAL REGURGITATION 11/22/2008  . CAD (coronary artery disease) 11/22/2008  . Hyperlipidemia 03/02/2008  . ALLERGIC  RHINITIS 03/02/2008  . OSA (obstructive sleep apnea) 03/02/2008    Past Medical History:  Diagnosis Date  . AAA (abdominal aortic aneurysm)repaired    a. 12/2006 s/p repair of 6cm infrarenal AAA with 53mm Dacron graft.  . Anemia   . Atrial fibrillation/Flutter    a. Dx 2014;  b. 10/2012 s/p TEE/DCCV;  c. On amio and Eliquis (CHA2DS2VASc = 6).  . Basal cell carcinoma    a. 2013 x 2 back and left neck  . BPH (benign prostatic hypertrophy)   . Carotid artery occlusion    a. 11/2008 s/p R CEA.  . Chronic systolic heart failure ([place])    a. 02/2012 Echo: EF 30-35%, Pulm HTN, and modest RV dysfunction;  b. 10/2012 TEE: EF 25-30%, mild conc LVH, diff HK, mod RV dysfxn, mod dil RA/LA, mild MR, no MS, nl MVR, mod TR, Ao sclerosis.  . CKD (chronic kidney disease), stage IV ([place])   . Coronary artery disease    a. 1999 PCI RCA;  b. 01/2005 NSTEMI: CABG x 3 (LIMA->LAD, VG->OM, VG->LPL) @ time of MVR.  [name] DVT (deep venous thrombosis) ([place])   . GERD (gastroesophageal reflux disease)   . History of shingles   . Hyperlipidemia   . Hypertensive heart disease   . Mitral regurgitation--s/p repair    a. 01/2005 MVR: 28-mm Edwards ET Logix ring annuloplasty; b. 10/2012 TEE: normal fxning MV ring with mild MR, no MS.  . Mixed Ischemic and Non-ischemic Cardiomyopathy    a. 02/2012 Echo: EF 30-35%, Pulm HTN, and modest RV dysfunction;  b. 10/2012 TEE: EF 25-30%, mild conc LVH, diff HK, mod RV dysfxn, mod dil RA/LA, mild MR, no MS, nl MVR, mod TR, Ao sclerosis.  . Obesity   . PVC (premature ventricular contraction)    a. 02/2009 s/p RFCA ([place]).  . Sick sinus syndrome ([place])    a. 01/2005 s/p PPM 2/2 heart block following CABG;  b. 05/2012 Gen Change: MDT Adapta ADDRL1 DC PPM, ser # [id number].  . Sleep apnea    a. Intolerant of CPAP  . Type II diabetes mellitus ([place])     Social History   Social History  . Marital status: Married    Spouse name: N/A  . Number of children: N/A  . Years of education: N/A    Occupational History  . Retired     Engineer, structural in [place]  .  Retired   Social History Main Topics  . Smoking status: Former Smoker    Packs/day: 1.50    Years: 34.00    Types: Cigarettes    Quit date: 08/28/1987  . Smokeless tobacco: Never Used  . Alcohol use 0.6 oz/week    1 Cans of beer per week     Comment: Occasionally but none since his surgery in  June  . Drug use: No  . Sexual activity: No   Other Topics Concern  . Not on file   Social History Narrative  . No narrative on file    Outpatient Encounter Prescriptions as of 05/29/2016  Medication Sig Note  . ACCU-CHEK SMARTVIEW test strip  11/22/2015: Received from: External Pharmacy  . amiodarone (PACERONE) 200 MG tablet take 1 tablet by mouth once daily   . apixaban (ELIQUIS) 5 MG TABS tablet Take 5 mg by mouth 2 (two) times daily.    . BD INSULIN SYRINGE ULTRAFINE 31G X 5/16" 0.3 ML MISC  02/22/2016: Received from: External Pharmacy  . BD INSULIN SYRINGE ULTRAFINE 31G X 5/16" 0.3 ML MISC use twice a day as directed   . calcitRIOL (ROCALTROL) 0.5 MCG capsule Take 0.5 mcg by mouth daily.   . carvedilol (COREG) 3.125 MG tablet Take 1 tablet (3.125 mg total) by mouth 2 (two) times daily with a meal.   . cholecalciferol (VITAMIN D) 1000 UNITS tablet Take 1,000 Units by mouth daily.   [name] docusate sodium (COLACE) 100 MG capsule Take 100 mg by mouth 2 (two) times daily.     . finasteride (PROSCAR) 5 MG tablet Take 5 mg by mouth daily.   . furosemide (LASIX) 40 MG tablet Take 1 tablet (40 mg total) by mouth every other day.   [name] HUMALOG MIX 75/25 (75-25) 100 UNIT/ML SUSP injection inject 5 to 10 units subcutaneously twice a day (Patient taking differently: inject 5 to 15 units subcutaneously twice a day)   . losartan (COZAAR) 25 MG tablet Take 25 mg by mouth daily.    . Multiple Vitamins-Minerals (MULTI-VITAMIN GUMMIES) CHEW Chew by mouth daily.   [name] omeprazole (PRILOSEC) 20 MG capsule take 1 capsule by mouth once daily if  needed   . rosuvastatin (CRESTOR) 20 MG tablet take 1 tablet by mouth at bedtime   . tamsulosin (FLOMAX) 0.4 MG CAPS capsule take 1 capsule by mouth once daily    No facility-administered encounter medications on file as of 05/29/2016.     Allergies  Allergen Reactions  . Shellfish Allergy Anaphylaxis    Review of Systems  Constitutional: Positive for malaise/fatigue. Negative for fever.  Respiratory: Positive for shortness of breath (chronic and unchanged). Negative for cough, hemoptysis, sputum production and wheezing.   Cardiovascular: Positive for orthopnea (chronic and unchanged) and leg swelling (chronic and unchanged). Negative for chest pain, palpitations and claudication.  Gastrointestinal: Negative.   Neurological: Positive for dizziness and weakness. Negative for headaches.  Endo/Heme/Allergies: Negative.   Psychiatric/Behavioral: Negative.    Objective:  BP (!) 104/52   Pulse 80   Temp 98 F (36.7 C) (Oral)   Resp 18   Physical Exam  Constitutional: He is oriented to person, place, and time and well-developed, well-nourished, and in no distress.  Morbidly obese white male in no acute distress sitting in a wheelchair in the exam room.  HENT:  Head: Normocephalic and atraumatic.  Right Ear: External ear normal.  Left Ear: External ear normal.  Nose: Nose normal.  Eyes: Conjunctivae are normal. No scleral icterus.  Neck: No thyromegaly present.  Cardiovascular: Normal rate and regular rhythm.   Pulmonary/Chest: Effort normal and breath sounds normal.  Abdominal: Soft.  Musculoskeletal: He exhibits edema.  Neurological: He is alert and oriented to person, place, and time.  Skin: Skin is warm and dry.  Psychiatric: Mood, affect and judgment normal.    Assessment and Plan :  1. Need for influenza  vaccination  - Flu vaccine HIGH DOSE PF (Fluzone High dose)  2. Essential hypertension   3. Hypotension, postural With ischemic cardiomyopathy and renal failure  volume depletion  is a tricky issue.  4. Type 2 diabetes mellitus with diabetic nephropathy, without long-term current use of insulin (HCC)  - Hemoglobin A1c  5. Hyperlipidemia, unspecified hyperlipidemia type  - Lipid Panel With LDL/HDL Ratio  6. Ischemic cardiomyopathy  - Home Health  7. Chronic renal failure, stage 4 (severe) (HCC)  - Home Health   HPI, Exam and A&P Transcribed under the direction and in the presence of Miguel Aschoff, Brooke Bonito., MD. Electronically Signed: Althea Charon, RMA I have done the exam and reviewed the chart and it is accurate to the best of my knowledge. Miguel Aschoff M.D. [facility]

## 2016-05-30 DIAGNOSIS — R600 Localized edema: Secondary | ICD-10-CM | POA: Diagnosis not present

## 2016-05-30 DIAGNOSIS — I10 Essential (primary) hypertension: Secondary | ICD-10-CM | POA: Diagnosis not present

## 2016-05-30 DIAGNOSIS — N184 Chronic kidney disease, stage 4 (severe): Secondary | ICD-10-CM | POA: Diagnosis not present

## 2016-05-30 DIAGNOSIS — N2581 Secondary hyperparathyroidism of renal origin: Secondary | ICD-10-CM | POA: Diagnosis not present

## 2016-05-30 DIAGNOSIS — E1122 Type 2 diabetes mellitus with diabetic chronic kidney disease: Secondary | ICD-10-CM | POA: Diagnosis not present

## 2016-06-02 ENCOUNTER — Telehealth: Payer: Self-pay | Admitting: Family Medicine

## 2016-06-02 DIAGNOSIS — E785 Hyperlipidemia, unspecified: Secondary | ICD-10-CM | POA: Diagnosis not present

## 2016-06-02 DIAGNOSIS — E1121 Type 2 diabetes mellitus with diabetic nephropathy: Secondary | ICD-10-CM | POA: Diagnosis not present

## 2016-06-02 NOTE — Telephone Encounter (Signed)
Pt wife [name] called saying she thhinks Jiles Garter may have tried to call her regarding some PT that Dr. Rosanna Randy is trying to set up.  She would like [place] to call her back [phone]  Thanks [place]

## 2016-06-03 LAB — LIPID PANEL WITH LDL/HDL RATIO
CHOLESTEROL TOTAL: 141 mg/dL (ref 100–199)
HDL: 42 mg/dL (ref >39)
LDL Calculated: 78 mg/dL (ref 0–99)
LDl/HDL Ratio: 1.9 ratio units (ref 0.0–3.6)
Triglycerides: 104 mg/dL (ref 0–149)
VLDL Cholesterol Cal: 21 mg/dL (ref 5–40)

## 2016-06-03 LAB — HEMOGLOBIN A1C
ESTIMATED AVERAGE GLUCOSE: 151 mg/dL
Hgb A1c MFr Bld: 6.9 % — ABNORMAL HIGH (ref 4.8–5.6)

## 2016-06-05 ENCOUNTER — Ambulatory Visit (INDEPENDENT_AMBULATORY_CARE_PROVIDER_SITE_OTHER): Payer: Medicare Other | Admitting: Cardiovascular Disease

## 2016-06-05 ENCOUNTER — Encounter: Payer: Self-pay | Admitting: Cardiovascular Disease

## 2016-06-05 VITALS — BP 98/50 | HR 78 | Ht 72.0 in | Wt 257.0 lb

## 2016-06-05 DIAGNOSIS — E1122 Type 2 diabetes mellitus with diabetic chronic kidney disease: Secondary | ICD-10-CM

## 2016-06-05 DIAGNOSIS — I739 Peripheral vascular disease, unspecified: Secondary | ICD-10-CM | POA: Insufficient documentation

## 2016-06-05 DIAGNOSIS — E785 Hyperlipidemia, unspecified: Secondary | ICD-10-CM

## 2016-06-05 DIAGNOSIS — I255 Ischemic cardiomyopathy: Secondary | ICD-10-CM

## 2016-06-05 DIAGNOSIS — I495 Sick sinus syndrome: Secondary | ICD-10-CM | POA: Diagnosis not present

## 2016-06-05 DIAGNOSIS — Z951 Presence of aortocoronary bypass graft: Secondary | ICD-10-CM

## 2016-06-05 DIAGNOSIS — N184 Chronic kidney disease, stage 4 (severe): Secondary | ICD-10-CM

## 2016-06-05 DIAGNOSIS — I2510 Atherosclerotic heart disease of native coronary artery without angina pectoris: Secondary | ICD-10-CM

## 2016-06-05 MED ORDER — CARVEDILOL 3.125 MG PO TABS: 3.1250 mg | ORAL_TABLET | Freq: Two times a day (BID) | ORAL | 3 refills | Status: DC

## 2016-06-05 NOTE — Telephone Encounter (Signed)
Home health referral for PT and Social worker Thanks ED

## 2016-06-05 NOTE — Telephone Encounter (Signed)
Judson Roch I think this may actually be for you. Thanks [employer]

## 2016-06-05 NOTE — Telephone Encounter (Signed)
I do not see where Dr Rosanna Randy has ordered PT.Please advise

## 2016-06-05 NOTE — Progress Notes (Signed)
Cardiology Office Note  Date:  06/05/2016   ID:  Marco Lutes Sr., DOB 16-Nov-1935, MRN [id number]  PCP:  Wilhemena Durie, MD   Chief Complaint  Patient presents with  . OTHER    1 month f/u. Meds reviewed verbally with pt.    HPI:  Marco [name] is a very pleasant 80 year old gentleman with a history of coronary artery disease, CABG, atrial fibrillation and DVT, on warfarin, diabetes, previous smoking history PVD, carotid endarterectomy, sick sinus syndrome, Previous history of systolic and diastolic CHF Pacemaker is followed by Dr. [name] He presents today for follow-up of his coronary artery disease He has LIMA to the LAD, vein graft to the circumflex, vein graft to the PL in June 2006 Also has mitral valve replacement with #28 Edwards logic ring S/p Pacemaker placement AAA repair May 2008. Baseline creatinine 2.5, now up to 3 Echo 04/2016 with EF 25 to 30%  On his last clinic visit, blood pressure was low, systolic of 80 It was recommended he decrease Lasix to every other day, carvedilol to 3.25 mg, hold losartan In follow-up today, he has restarted losartan every other day Systolic pressure in the office around 100 Reports that he feels well with no complaints No significant lower extremity edema, blood pressure stable Seen by kidney doctor, lateef Rosanna Randy ordered home PT, legs do not want a work Weight unclear, did not wake accurately on today's visit  EKG on today's visit shows normal sinus rhythm, poor R-wave progression to the anterior precordial leads, left axis deviation, intraventricular conduction delay  Other past medical history reviewed Echo 04/2016 with EF 25 to 30% MV replacement, moderately elevated RVSP  hospitalization March 2017 for gastroenteritis  cholesterol 145, LDL 77 Hemoglobin A1c 6.8  Previous episodes of near syncope, lightheadedness, improved symptoms by holding his hydralazine and isosorbide  left hand weakness started in December  2014.  carotid angiogram showed patent right carotid endarterectomy site, moderate disease on the left etiology of his symptoms was unclear. carotid disease monitored by Dr. Ronalee Belts.  Echocardiogram in the hospital 03/12/2012 showed moderate right ventricular pressures 50-60 mmHg consistent with moderate pulmonary hypertension, ejection fraction 30-35%, moderately dilated left atrium, dilated left ventricle.   PMH:   has a past medical history of AAA (abdominal aortic aneurysm)repaired; Anemia; Atrial fibrillation/Flutter; Basal cell carcinoma; BPH (benign prostatic hypertrophy); Carotid artery occlusion; Chronic systolic heart failure (HCC); CKD (chronic kidney disease), stage IV ([place]); Coronary artery disease; DVT (deep venous thrombosis) ([place]); GERD (gastroesophageal reflux disease); History of shingles; Hyperlipidemia; Hypertensive heart disease; Mitral regurgitation--s/p repair; Mixed Ischemic and Non-ischemic Cardiomyopathy; Obesity; PVC (premature ventricular contraction); Sick sinus syndrome ([place]); Sleep apnea; and Type II diabetes mellitus ([place]).  PSH:    Past Surgical History:  Procedure Laterality Date  . ABDOMINAL AORTIC ANEURYSM REPAIR      May 2008,   . CARDIAC CATHETERIZATION  2006   @ [facility]  . CATARACT EXTRACTION     right  . CORONARY ARTERY BYPASS GRAFT     CABG with a  LIMA to the LAD, SVG to circumflex, SVG to posterolateral June2006.  Randolm Idol / REPLACE / REMOVE PACEMAKER  2006   Dr. [name]  . MITRAL VALVE REPAIR  2006    mitral valve repair with a #28 Edwards logic ring  . PACEMAKER PLACEMENT    . PERMANENT PACEMAKER GENERATOR CHANGE N/A 06/26/2012   Procedure: PERMANENT PACEMAKER GENERATOR CHANGE;  Surgeon: Deboraha Sprang, MD;  Location: [facility] CATH LAB;  Service: Cardiovascular;  Laterality: N/A;  . SKIN BIOPSY     left wrist; right elbow  . SKIN CANCER EXCISION      x 6  . SKIN CANCER EXCISION    . TRANSESOPHAGEAL ECHOCARDIOGRAM    . VASECTOMY      Current  Outpatient Prescriptions  Medication Sig Dispense Refill  . ACCU-CHEK SMARTVIEW test strip   1  . amiodarone (PACERONE) 200 MG tablet take 1 tablet by mouth once daily 30 tablet 3  . apixaban (ELIQUIS) 5 MG TABS tablet Take 5 mg by mouth 2 (two) times daily.     . BD INSULIN SYRINGE ULTRAFINE 31G X 5/16" 0.3 ML MISC   1  . BD INSULIN SYRINGE ULTRAFINE 31G X 5/16" 0.3 ML MISC use twice a day as directed 100 each 12  . calcitRIOL (ROCALTROL) 0.5 MCG capsule Take 0.5 mcg by mouth daily.    . carvedilol (COREG) 3.125 MG tablet Take 1 tablet (3.125 mg total) by mouth 2 (two) times daily with a meal.    . cholecalciferol (VITAMIN D) 1000 UNITS tablet Take 1,000 Units by mouth daily.    [name] docusate sodium (COLACE) 100 MG capsule Take 100 mg by mouth 2 (two) times daily.      . finasteride (PROSCAR) 5 MG tablet Take 5 mg by mouth daily.    . furosemide (LASIX) 40 MG tablet Take 1 tablet (40 mg total) by mouth every other day. 60 tablet 12  . HUMALOG MIX 75/25 (75-25) 100 UNIT/ML SUSP injection inject 5 to 10 units subcutaneously twice a day (Patient taking differently: inject 5 to 15 units subcutaneously twice a day) 10 vial 5  . losartan (COZAAR) 25 MG tablet Take 25 mg by mouth daily.   0  . Multiple Vitamins-Minerals (MULTI-VITAMIN GUMMIES) CHEW Chew by mouth daily.    [name] omeprazole (PRILOSEC) 20 MG capsule take 1 capsule by mouth once daily if needed 30 capsule 12  . rosuvastatin (CRESTOR) 20 MG tablet take 1 tablet by mouth at bedtime 90 tablet 3  . tamsulosin (FLOMAX) 0.4 MG CAPS capsule take 1 capsule by mouth once daily 30 capsule 12   No current facility-administered medications for this visit.      Allergies:   Shellfish allergy   Social History:  The patient  reports that he quit smoking about 28 years ago. His smoking use included Cigarettes. He has a 51.00 pack-year smoking history. He has never used smokeless tobacco. He reports that he drinks about 0.6 oz of alcohol per week . He  reports that he does not use drugs.   Family History:   family history includes Diabetes in his father, sister, and son; Heart attack in his sister, sister, and sister; Heart attack (age of onset: 35) in his brother; Heart attack (age of onset: 25) in his brother; Heart attack (age of onset: 86) in his brother; Heart attack (age of onset: 75) in his father; Heart attack (age of onset: 37) in his mother; Heart disease in his brother and father; Hypertension in his sister.    Review of Systems: Review of Systems  Constitutional: Negative.   Respiratory: Negative.   Cardiovascular: Negative.   Gastrointestinal: Negative.   Musculoskeletal: Negative.        Gait instability  Neurological: Negative.   Psychiatric/Behavioral: Negative.   All other systems reviewed and are negative.    PHYSICAL EXAM: VS:  BP (!) 98/50 (BP Location: Right Arm, Patient Position: Sitting, Cuff Size: Large)   Pulse 78  Ht 6' (1.829 m)   Wt 257 lb (116.6 kg)   BMI 34.86 kg/m  , BMI Body mass index is 34.86 kg/m. GEN: Well nourished, well developed, in no acute distress, obese  HEENT: normal  Neck: no JVD, carotid bruits, or masses Cardiac: RRR; 2+ SEM LSB, no rubs, or gallops,no edema  Respiratory:  clear to auscultation bilaterally, normal work of breathing GI: soft, nontender, nondistended, + BS MS: no deformity or atrophy  Skin: warm and dry, no rash Neuro:  Strength and sensation are intact Psych: euthymic mood, full affect    Recent Labs: 10/28/2015: Magnesium 2.4 10/30/2015: Hemoglobin 13.5 04/04/2016: ALT 16; Platelets 185; TSH 1.890 05/03/2016: BUN 56; Creatinine, Ser 3.08; Potassium 4.5; Sodium 142    Lipid Panel Lab Results  Component Value Date   CHOL 141 06/02/2016   HDL 42 06/02/2016   LDLCALC 78 06/02/2016   TRIG 104 06/02/2016      Wt Readings from Last 3 Encounters:  06/05/16 257 lb (116.6 kg) (patient unable to stand, this is home weight )  05/03/16 265 lb 12 oz (120.5 kg)   04/04/16 268 lb 8 oz (121.8 kg)      ASSESSMENT AND PLAN:  Hyperlipidemia, unspecified hyperlipidemia type - Plan: EKG 12-Lead Cholesterol is at goal on the current lipid regimen. No changes to the medications were made.  Coronary artery disease involving native coronary artery of native heart without angina pectoris - Plan: EKG 12-Lead Currently with no symptoms of angina. No further workup at this time. Continue current medication regimen.  Ischemic cardiomyopathy  s/p CABG - Plan: EKG 12-Lead Medication options limited given low blood pressure Recommended he start losartan 25 mg daily rather than every other day We'll continue Coreg 3.125 mg twice a day Lasix every other day, extra Lasix for ankle swelling  Sinoatrial node dysfunction (HCC) - Plan: EKG 12-Lead  Morbid obesity ([place]) - Plan: EKG 12-Lead Unable to exercise, discussed strict low carbohydrate diet  S/P CABG (coronary artery bypass graft) - Plan: EKG 12-Lead  Stage 4 chronic renal impairment associated with type 2 diabetes mellitus ([place]) - Plan: EKG 12-Lead Followed by renal, recent climb in creatinine up to 3  PAD (peripheral artery disease) ([place]) - Plan: EKG 12-Lead   Total encounter time more than 25 minutes  Greater than 50% was spent in counseling and coordination of care with the patient   Disposition:   F/U  6 months   Orders Placed This Encounter  Procedures  . EKG 12-Lead     Signed, Esmond Plants, M.D., Ph.D. 06/05/2016  [place], [place]

## 2016-06-05 NOTE — Patient Instructions (Addendum)
Medication Instructions:   Ok to increase the losartan up to one a day Extra lasix for ankle swelling  Labwork:  No new labs needed  Testing/Procedures:  No further testing at this time   Follow-Up: It was a pleasure seeing you in the office today. Please call us if you have new issues that need to be addressed before your next appt.  [phone]  Your physician wants you to follow-up in: 6 months.  You will receive a reminder letter in the mail two months in advance. If you don't receive a letter, please call our office to schedule the follow-up appointment.  If you need a refill on your cardiac medications before your next appointment, please call your pharmacy.

## 2016-06-06 ENCOUNTER — Telehealth: Payer: Self-pay

## 2016-06-06 NOTE — Telephone Encounter (Signed)
I spoke with Carlean Purl from [facility] who states they have reached out to pt and services will begin 06/07/16

## 2016-06-06 NOTE — Telephone Encounter (Signed)
-----   Message from [name], MD sent at 06/06/2016  8:37 AM EDT ----- Labs stable.

## 2016-06-06 NOTE — Telephone Encounter (Signed)
Pt advised.   Thanks,   -Durwood Dittus  

## 2016-06-07 DIAGNOSIS — Z9114 Patient's other noncompliance with medication regimen: Secondary | ICD-10-CM | POA: Diagnosis not present

## 2016-06-07 DIAGNOSIS — I480 Paroxysmal atrial fibrillation: Secondary | ICD-10-CM | POA: Diagnosis not present

## 2016-06-07 DIAGNOSIS — Z7901 Long term (current) use of anticoagulants: Secondary | ICD-10-CM | POA: Diagnosis not present

## 2016-06-07 DIAGNOSIS — I5022 Chronic systolic (congestive) heart failure: Secondary | ICD-10-CM | POA: Diagnosis not present

## 2016-06-07 DIAGNOSIS — D649 Anemia, unspecified: Secondary | ICD-10-CM | POA: Diagnosis not present

## 2016-06-08 ENCOUNTER — Telehealth: Payer: Self-pay

## 2016-06-08 DIAGNOSIS — D649 Anemia, unspecified: Secondary | ICD-10-CM | POA: Diagnosis not present

## 2016-06-08 DIAGNOSIS — I5022 Chronic systolic (congestive) heart failure: Secondary | ICD-10-CM | POA: Diagnosis not present

## 2016-06-08 DIAGNOSIS — Z9114 Patient's other noncompliance with medication regimen: Secondary | ICD-10-CM | POA: Diagnosis not present

## 2016-06-08 DIAGNOSIS — Z7901 Long term (current) use of anticoagulants: Secondary | ICD-10-CM | POA: Diagnosis not present

## 2016-06-08 DIAGNOSIS — I480 Paroxysmal atrial fibrillation: Secondary | ICD-10-CM | POA: Diagnosis not present

## 2016-06-08 NOTE — Telephone Encounter (Signed)
ok 

## 2016-06-08 NOTE — Telephone Encounter (Signed)
Lemuel with [facility] is requesting a verbal order for PT. 1 time a week for 1 week. 2 times a week for 3 weeks. Working on lower extremity strengthening, improving and extending balance.

## 2016-06-08 NOTE — Telephone Encounter (Signed)
Please review-aa 

## 2016-06-09 DIAGNOSIS — N184 Chronic kidney disease, stage 4 (severe): Secondary | ICD-10-CM | POA: Diagnosis not present

## 2016-06-09 NOTE — Telephone Encounter (Signed)
Lemeul advised. Renaldo Fiddler, CMA

## 2016-06-10 ENCOUNTER — Inpatient Hospital Stay
Admission: EM | Admit: 2016-06-10 | Discharge: 2016-06-12 | DRG: 392 | Disposition: A | Discharge status: Home Health | Payer: Medicare Other | Attending: Internal Medicine | Admitting: Internal Medicine

## 2016-06-10 ENCOUNTER — Encounter: Payer: Self-pay | Admitting: Emergency Medicine

## 2016-06-10 DIAGNOSIS — I130 Hypertensive heart and chronic kidney disease with heart failure and stage 1 through stage 4 chronic kidney disease, or unspecified chronic kidney disease: Secondary | ICD-10-CM | POA: Diagnosis present

## 2016-06-10 DIAGNOSIS — D649 Anemia, unspecified: Secondary | ICD-10-CM | POA: Diagnosis not present

## 2016-06-10 DIAGNOSIS — G4733 Obstructive sleep apnea (adult) (pediatric): Secondary | ICD-10-CM | POA: Diagnosis not present

## 2016-06-10 DIAGNOSIS — N400 Enlarged prostate without lower urinary tract symptoms: Secondary | ICD-10-CM | POA: Diagnosis not present

## 2016-06-10 DIAGNOSIS — Z79899 Other long term (current) drug therapy: Secondary | ICD-10-CM

## 2016-06-10 DIAGNOSIS — K922 Gastrointestinal hemorrhage, unspecified: Secondary | ICD-10-CM

## 2016-06-10 DIAGNOSIS — I252 Old myocardial infarction: Secondary | ICD-10-CM | POA: Diagnosis not present

## 2016-06-10 DIAGNOSIS — J9610 Chronic respiratory failure, unspecified whether with hypoxia or hypercapnia: Secondary | ICD-10-CM | POA: Diagnosis not present

## 2016-06-10 DIAGNOSIS — E1122 Type 2 diabetes mellitus with diabetic chronic kidney disease: Secondary | ICD-10-CM | POA: Diagnosis not present

## 2016-06-10 DIAGNOSIS — I272 Pulmonary hypertension, unspecified: Secondary | ICD-10-CM | POA: Diagnosis not present

## 2016-06-10 DIAGNOSIS — I482 Chronic atrial fibrillation: Secondary | ICD-10-CM | POA: Diagnosis not present

## 2016-06-10 DIAGNOSIS — N184 Chronic kidney disease, stage 4 (severe): Secondary | ICD-10-CM | POA: Diagnosis present

## 2016-06-10 DIAGNOSIS — D62 Acute posthemorrhagic anemia: Secondary | ICD-10-CM

## 2016-06-10 DIAGNOSIS — Z9114 Patient's other noncompliance with medication regimen: Secondary | ICD-10-CM | POA: Diagnosis not present

## 2016-06-10 DIAGNOSIS — K219 Gastro-esophageal reflux disease without esophagitis: Secondary | ICD-10-CM | POA: Diagnosis not present

## 2016-06-10 DIAGNOSIS — K5900 Constipation, unspecified: Secondary | ICD-10-CM | POA: Diagnosis not present

## 2016-06-10 DIAGNOSIS — K921 Melena: Secondary | ICD-10-CM | POA: Diagnosis present

## 2016-06-10 DIAGNOSIS — K5730 Diverticulosis of large intestine without perforation or abscess without bleeding: Secondary | ICD-10-CM | POA: Diagnosis present

## 2016-06-10 DIAGNOSIS — Z950 Presence of cardiac pacemaker: Secondary | ICD-10-CM

## 2016-06-10 DIAGNOSIS — G4730 Sleep apnea, unspecified: Secondary | ICD-10-CM | POA: Diagnosis not present

## 2016-06-10 DIAGNOSIS — E669 Obesity, unspecified: Secondary | ICD-10-CM | POA: Diagnosis present

## 2016-06-10 DIAGNOSIS — Z9861 Coronary angioplasty status: Secondary | ICD-10-CM | POA: Diagnosis not present

## 2016-06-10 DIAGNOSIS — I4892 Unspecified atrial flutter: Secondary | ICD-10-CM | POA: Diagnosis present

## 2016-06-10 DIAGNOSIS — Z8249 Family history of ischemic heart disease and other diseases of the circulatory system: Secondary | ICD-10-CM

## 2016-06-10 DIAGNOSIS — I5022 Chronic systolic (congestive) heart failure: Secondary | ICD-10-CM | POA: Diagnosis present

## 2016-06-10 DIAGNOSIS — Z9981 Dependence on supplemental oxygen: Secondary | ICD-10-CM

## 2016-06-10 DIAGNOSIS — I2510 Atherosclerotic heart disease of native coronary artery without angina pectoris: Secondary | ICD-10-CM | POA: Diagnosis present

## 2016-06-10 DIAGNOSIS — Z8679 Personal history of other diseases of the circulatory system: Secondary | ICD-10-CM

## 2016-06-10 DIAGNOSIS — K5909 Other constipation: Secondary | ICD-10-CM | POA: Diagnosis not present

## 2016-06-10 DIAGNOSIS — K625 Hemorrhage of anus and rectum: Secondary | ICD-10-CM | POA: Diagnosis not present

## 2016-06-10 DIAGNOSIS — J9611 Chronic respiratory failure with hypoxia: Secondary | ICD-10-CM | POA: Diagnosis not present

## 2016-06-10 DIAGNOSIS — E1121 Type 2 diabetes mellitus with diabetic nephropathy: Secondary | ICD-10-CM

## 2016-06-10 DIAGNOSIS — I509 Heart failure, unspecified: Secondary | ICD-10-CM | POA: Diagnosis not present

## 2016-06-10 DIAGNOSIS — Z85828 Personal history of other malignant neoplasm of skin: Secondary | ICD-10-CM

## 2016-06-10 DIAGNOSIS — Z951 Presence of aortocoronary bypass graft: Secondary | ICD-10-CM

## 2016-06-10 DIAGNOSIS — E785 Hyperlipidemia, unspecified: Secondary | ICD-10-CM | POA: Diagnosis present

## 2016-06-10 DIAGNOSIS — I480 Paroxysmal atrial fibrillation: Secondary | ICD-10-CM | POA: Diagnosis not present

## 2016-06-10 DIAGNOSIS — K649 Unspecified hemorrhoids: Secondary | ICD-10-CM | POA: Diagnosis present

## 2016-06-10 DIAGNOSIS — Z7901 Long term (current) use of anticoagulants: Secondary | ICD-10-CM

## 2016-06-10 DIAGNOSIS — Z87891 Personal history of nicotine dependence: Secondary | ICD-10-CM

## 2016-06-10 DIAGNOSIS — E1165 Type 2 diabetes mellitus with hyperglycemia: Secondary | ICD-10-CM | POA: Diagnosis not present

## 2016-06-10 DIAGNOSIS — Z8619 Personal history of other infectious and parasitic diseases: Secondary | ICD-10-CM

## 2016-06-10 DIAGNOSIS — Z794 Long term (current) use of insulin: Secondary | ICD-10-CM

## 2016-06-10 DIAGNOSIS — Z833 Family history of diabetes mellitus: Secondary | ICD-10-CM

## 2016-06-10 LAB — COMPREHENSIVE METABOLIC PANEL
ALT: 17 U/L (ref 17–63)
ANION GAP: 7 (ref 5–15)
AST: 18 U/L (ref 15–41)
Albumin: 3.2 g/dL — ABNORMAL LOW (ref 3.5–5.0)
Alkaline Phosphatase: 51 U/L (ref 38–126)
BILIRUBIN TOTAL: 0.4 mg/dL (ref 0.3–1.2)
BUN: 47 mg/dL — AB (ref 6–20)
CALCIUM: 9.7 mg/dL (ref 8.9–10.3)
CO2: 33 mmol/L — ABNORMAL HIGH (ref 22–32)
Chloride: 99 mmol/L — ABNORMAL LOW (ref 101–111)
Creatinine, Ser: 2.81 mg/dL — ABNORMAL HIGH (ref 0.61–1.24)
GFR calc Af Amer: 23 mL/min — ABNORMAL LOW (ref >60)
GFR, EST NON AFRICAN AMERICAN: 20 mL/min — AB (ref >60)
Glucose, Bld: 136 mg/dL — ABNORMAL HIGH (ref 65–99)
POTASSIUM: 4.2 mmol/L (ref 3.5–5.1)
Sodium: 139 mmol/L (ref 135–145)
Total Protein: 6.9 g/dL (ref 6.5–8.1)

## 2016-06-10 LAB — PROTIME-INR
INR: 1.1
Prothrombin Time: 14.2 seconds (ref 11.4–15.2)

## 2016-06-10 LAB — URINALYSIS COMPLETE WITH MICROSCOPIC (ARMC ONLY)
BILIRUBIN URINE: NEGATIVE
Bacteria, UA: NONE SEEN
Glucose, UA: 150 mg/dL — AB
HGB URINE DIPSTICK: NEGATIVE
Ketones, ur: NEGATIVE mg/dL
Leukocytes, UA: NEGATIVE
Nitrite: NEGATIVE
Protein, ur: NEGATIVE mg/dL
SPECIFIC GRAVITY, URINE: 1.01 (ref 1.005–1.030)
pH: 6 (ref 5.0–8.0)

## 2016-06-10 LAB — HEMOGLOBIN: HEMOGLOBIN: 12.4 g/dL — AB (ref 13.0–18.0)

## 2016-06-10 LAB — TYPE AND SCREEN
ABO/RH(D): O POS
ANTIBODY SCREEN: NEGATIVE

## 2016-06-10 LAB — CBC
HEMATOCRIT: 36.9 % — AB (ref 40.0–52.0)
HEMOGLOBIN: 12.3 g/dL — AB (ref 13.0–18.0)
MCH: 30.8 pg (ref 26.0–34.0)
MCHC: 33.3 g/dL (ref 32.0–36.0)
MCV: 92.4 fL (ref 80.0–100.0)
Platelets: 146 10*3/uL — ABNORMAL LOW (ref 150–440)
RBC: 3.99 MIL/uL — ABNORMAL LOW (ref 4.40–5.90)
RDW: 14.2 % (ref 11.5–14.5)
WBC: 7.2 10*3/uL (ref 3.8–10.6)

## 2016-06-10 LAB — GLUCOSE, CAPILLARY: GLUCOSE-CAPILLARY: 153 mg/dL — AB (ref 65–99)

## 2016-06-10 MED ORDER — FUROSEMIDE 40 MG PO TABS
40.0000 mg | ORAL_TABLET | ORAL | Status: DC
  Administered 2016-06-12: 40 mg via ORAL
  Filled 2016-06-10: qty 1

## 2016-06-10 MED ORDER — ADULT MULTIVITAMIN W/MINERALS CH
ORAL_TABLET | Freq: Every day | ORAL | Status: DC
  Administered 2016-06-11: 2 via ORAL
  Administered 2016-06-12: 1 via ORAL
  Filled 2016-06-10 (×2): qty 1

## 2016-06-10 MED ORDER — ONDANSETRON HCL 4 MG/2ML IJ SOLN
4.0000 mg | Freq: Four times a day (QID) | INTRAMUSCULAR | Status: DC | PRN
  Administered 2016-06-11: 4 mg via INTRAVENOUS
  Filled 2016-06-10: qty 2

## 2016-06-10 MED ORDER — INSULIN ASPART 100 UNIT/ML ~~LOC~~ SOLN
0.0000 [IU] | Freq: Three times a day (TID) | SUBCUTANEOUS | Status: DC
  Administered 2016-06-11: 2 [IU] via SUBCUTANEOUS
  Administered 2016-06-11 – 2016-06-12 (×3): 1 [IU] via SUBCUTANEOUS
  Filled 2016-06-10 (×2): qty 1
  Filled 2016-06-10 (×2): qty 2

## 2016-06-10 MED ORDER — HYDROCODONE-ACETAMINOPHEN 5-325 MG PO TABS
1.0000 | ORAL_TABLET | ORAL | Status: DC | PRN
  Administered 2016-06-11 (×2): 1 via ORAL
  Administered 2016-06-12: 2 via ORAL
  Filled 2016-06-10: qty 2
  Filled 2016-06-10 (×2): qty 1

## 2016-06-10 MED ORDER — POLYETHYLENE GLYCOL 3350 17 G PO PACK: 17.0000 g | PACK | Freq: Every day | ORAL | Status: DC | PRN

## 2016-06-10 MED ORDER — PANTOPRAZOLE SODIUM 40 MG PO TBEC
40.0000 mg | DELAYED_RELEASE_TABLET | ORAL | Status: DC
  Administered 2016-06-12: 40 mg via ORAL
  Filled 2016-06-10: qty 1

## 2016-06-10 MED ORDER — SODIUM CHLORIDE 0.9% FLUSH
3.0000 mL | Freq: Two times a day (BID) | INTRAVENOUS | Status: DC
  Administered 2016-06-10 – 2016-06-12 (×3): 3 mL via INTRAVENOUS

## 2016-06-10 MED ORDER — CARVEDILOL 6.25 MG PO TABS
3.1250 mg | ORAL_TABLET | Freq: Two times a day (BID) | ORAL | Status: DC
  Administered 2016-06-11 – 2016-06-12 (×3): 3.125 mg via ORAL
  Filled 2016-06-10 (×3): qty 1

## 2016-06-10 MED ORDER — LOSARTAN POTASSIUM 50 MG PO TABS
25.0000 mg | ORAL_TABLET | Freq: Every day | ORAL | Status: DC
  Administered 2016-06-11 – 2016-06-12 (×2): 25 mg via ORAL
  Filled 2016-06-10 (×2): qty 1

## 2016-06-10 MED ORDER — METRONIDAZOLE 500 MG PO TABS
500.0000 mg | ORAL_TABLET | Freq: Three times a day (TID) | ORAL | Status: DC
  Administered 2016-06-10 – 2016-06-11 (×2): 500 mg via ORAL
  Filled 2016-06-10 (×2): qty 1

## 2016-06-10 MED ORDER — ONDANSETRON HCL 4 MG PO TABS: 4.0000 mg | ORAL_TABLET | Freq: Four times a day (QID) | ORAL | Status: DC | PRN

## 2016-06-10 MED ORDER — ACETAMINOPHEN 650 MG RE SUPP: 650.0000 mg | Freq: Four times a day (QID) | RECTAL | Status: DC | PRN

## 2016-06-10 MED ORDER — FINASTERIDE 5 MG PO TABS
5.0000 mg | ORAL_TABLET | Freq: Every day | ORAL | Status: DC
  Administered 2016-06-11 – 2016-06-12 (×2): 5 mg via ORAL
  Filled 2016-06-10 (×2): qty 1

## 2016-06-10 MED ORDER — SODIUM CHLORIDE 0.9 % IV SOLN
INTRAVENOUS | Status: DC
  Administered 2016-06-10 – 2016-06-11 (×2): via INTRAVENOUS

## 2016-06-10 MED ORDER — CALCITRIOL 0.5 MCG PO CAPS
0.5000 ug | ORAL_CAPSULE | Freq: Every day | ORAL | Status: DC
  Administered 2016-06-11 – 2016-06-12 (×2): 0.5 ug via ORAL
  Filled 2016-06-10 (×2): qty 1

## 2016-06-10 MED ORDER — METRONIDAZOLE IN NACL 5-0.79 MG/ML-% IV SOLN: 500.0000 mg | Freq: Three times a day (TID) | INTRAVENOUS | Status: DC

## 2016-06-10 MED ORDER — ROSUVASTATIN CALCIUM 20 MG PO TABS
20.0000 mg | ORAL_TABLET | Freq: Every day | ORAL | Status: DC
  Administered 2016-06-10 – 2016-06-11 (×2): 20 mg via ORAL
  Filled 2016-06-10 (×2): qty 1

## 2016-06-10 MED ORDER — HYDROMORPHONE HCL 1 MG/ML IJ SOLN
1.0000 mg | INTRAMUSCULAR | Status: DC | PRN
  Administered 2016-06-11: 1 mg via INTRAVENOUS
  Filled 2016-06-10: qty 1

## 2016-06-10 MED ORDER — ACETAMINOPHEN 325 MG PO TABS: 650.0000 mg | ORAL_TABLET | Freq: Four times a day (QID) | ORAL | Status: DC | PRN

## 2016-06-10 MED ORDER — AMIODARONE HCL 200 MG PO TABS
200.0000 mg | ORAL_TABLET | Freq: Every day | ORAL | Status: DC
  Administered 2016-06-11 – 2016-06-12 (×2): 200 mg via ORAL
  Filled 2016-06-10 (×2): qty 1

## 2016-06-10 MED ORDER — CIPROFLOXACIN IN D5W 400 MG/200ML IV SOLN
400.0000 mg | INTRAVENOUS | Status: DC
  Administered 2016-06-10: 400 mg via INTRAVENOUS
  Filled 2016-06-10 (×2): qty 200

## 2016-06-10 MED ORDER — VITAMIN D 1000 UNITS PO TABS
1000.0000 [IU] | ORAL_TABLET | Freq: Every day | ORAL | Status: DC
  Administered 2016-06-11 – 2016-06-12 (×2): 1000 [IU] via ORAL
  Filled 2016-06-10 (×2): qty 1

## 2016-06-10 MED ORDER — FAMOTIDINE IN NACL 20-0.9 MG/50ML-% IV SOLN
20.0000 mg | Freq: Two times a day (BID) | INTRAVENOUS | Status: DC
  Administered 2016-06-10 – 2016-06-11 (×2): 20 mg via INTRAVENOUS
  Filled 2016-06-10 (×4): qty 50

## 2016-06-10 MED ORDER — TAMSULOSIN HCL 0.4 MG PO CAPS
0.4000 mg | ORAL_CAPSULE | Freq: Every day | ORAL | Status: DC
  Administered 2016-06-10 – 2016-06-12 (×3): 0.4 mg via ORAL
  Filled 2016-06-10 (×3): qty 1

## 2016-06-10 NOTE — ED Notes (Signed)
Patient states that he has had having bright red bleeding since this morning, bleeding started after bowel movement. Patient denies abdominal pain or rectal pain.  Patient states that he feels more weak and tired than normal. Patient denies dizziness or lightheadedness.

## 2016-06-10 NOTE — ED Triage Notes (Signed)
Patient brought in by [facility] from home for rectal bleeding that started this morning. Patient states that he started having bright red blood from the rectum after having a bowel movement this morning. Patient states that blood was dripping out when he was sitting on the toilet. Patient does not appear to be in any distress at this time.

## 2016-06-10 NOTE — ED Notes (Signed)
Hemoccult positive 

## 2016-06-10 NOTE — ED Provider Notes (Signed)
[facility] Emergency Department Provider Note  ____________________________________________  Time seen: Approximately 3:53 PM  I have reviewed the triage vital signs and the nursing notes.   HISTORY  Chief Complaint Rectal Bleeding   HPI Marco Tainter Sr. is a 80 y.o. male with h/o AAA repaired in 2008, afib on Eliquis, anemia, CHF, CKD, CAD s/p CABG, DM, and diverticulosis who presents for evaluation of bright red blood per rectum. Patient reports that he had a normal bowel movement earlier today and noticed bright red blood in the toilet. He went back to the bathroom a few times throughout the day and every time noticed bright red blood dripping from his rectum. He denies clots, melena, prior history of GI bleed, abdominal pain, syncope, dizziness, chest pain, shortness of breath. He does endorse worsening fatigue today. Patient reports compliance with his Eliquis. Last colonoscopy 10 years ago showing polyps only according to patient. Patient had a CT abdomen and pelvis without contrast in March 2017 that showed diverticulosis.   Past Medical History:  Diagnosis Date  . AAA (abdominal aortic aneurysm)repaired    a. 12/2006 s/p repair of 6cm infrarenal AAA with 65mm Dacron graft.  . Anemia   . Atrial fibrillation/Flutter    a. Dx 2014;  b. 10/2012 s/p TEE/DCCV;  c. On amio and Eliquis (CHA2DS2VASc = 6).  . Basal cell carcinoma    a. 2013 x 2 back and left neck  . BPH (benign prostatic hypertrophy)   . Carotid artery occlusion    a. 11/2008 s/p R CEA.  . Chronic systolic heart failure ([place])    a. 02/2012 Echo: EF 30-35%, Pulm HTN, and modest RV dysfunction;  b. 10/2012 TEE: EF 25-30%, mild conc LVH, diff HK, mod RV dysfxn, mod dil RA/LA, mild MR, no MS, nl MVR, mod TR, Ao sclerosis.  . CKD (chronic kidney disease), stage IV ([place])   . Coronary artery disease    a. 1999 PCI RCA;  b. 01/2005 NSTEMI: CABG x 3 (LIMA->LAD, VG->OM, VG->LPL) @ time of MVR.  [name]  DVT (deep venous thrombosis) ([place])   . GERD (gastroesophageal reflux disease)   . History of shingles   . Hyperlipidemia   . Hypertensive heart disease   . Mitral regurgitation--s/p repair    a. 01/2005 MVR: 28-mm Edwards ET Logix ring annuloplasty; b. 10/2012 TEE: normal fxning MV ring with mild MR, no MS.  . Mixed Ischemic and Non-ischemic Cardiomyopathy    a. 02/2012 Echo: EF 30-35%, Pulm HTN, and modest RV dysfunction;  b. 10/2012 TEE: EF 25-30%, mild conc LVH, diff HK, mod RV dysfxn, mod dil RA/LA, mild MR, no MS, nl MVR, mod TR, Ao sclerosis.  . Obesity   . PVC (premature ventricular contraction)    a. 02/2009 s/p RFCA ([place]).  . Sick sinus syndrome ([place])    a. 01/2005 s/p PPM 2/2 heart block following CABG;  b. 05/2012 Gen Change: MDT Adapta ADDRL1 DC PPM, ser # [id number].  . Sleep apnea    a. Intolerant of CPAP  . Type II diabetes mellitus [facility])     Patient Active Problem List   Diagnosis Date Noted  . LGI bleed 06/10/2016  . Acute blood loss anemia 06/10/2016  . Chronic respiratory failure with hypoxia, on home O2 therapy ([place]) 06/10/2016  . PAD (peripheral artery disease) ([place]) 06/05/2016  . Chest pain 10/28/2015  . Gastroenteritis   . Diarrhea of infectious origin   . Emesis   . S/P CABG (coronary  artery bypass graft)   . Coronary artery disease involving native coronary artery of native heart without angina pectoris   . PAF (paroxysmal atrial fibrillation) ([place])   . Absolute anemia 01/28/2015  . Atherosclerosis of coronary artery 01/28/2015  . Barrett's esophagus 01/28/2015  . Basal cell carcinoma of face 01/28/2015  . Benign fibroma of prostate 01/28/2015  . CCF (congestive cardiac failure) ([place]) 01/28/2015  . Diabetes ([place]) 01/28/2015  . Gastro-esophageal reflux disease without esophagitis 01/28/2015  . Irritable colon 01/28/2015  . Cannot sleep 01/28/2015  . Pleural cavity effusion 01/28/2015  . Apnea, sleep 01/28/2015  . Hematuria 12/21/2014  . Left hand  weakness 09/23/2013  . Essential hypertension 06/23/2013  . Chronic systolic heart failure ([place]) 06/10/2013  . Hypotension, postural 06/10/2013  . Hyperkalemia 06/10/2013  . Adjustment of cardiac pacemaker 06/10/2013  . Atrial fibrillation and flutter ([place]) 10/22/2012  . Pacemaker-Medtronic 06/19/2012  . Pulmonary hypertension 03/18/2012  . Occlusion and stenosis of carotid artery without mention of cerebral infarction 02/08/2012  . Morbid obesity ([place]) 08/05/2010  . Sinoatrial node dysfunction ([facility]) 06/02/2010  . Stage 4 chronic renal impairment associated with type 2 diabetes mellitus ([place]) 08/09/2009  . AAA 04/21/2009  . Ischemic cardiomyopathy  s/p CABG 11/23/2008  . MITRAL REGURGITATION 11/22/2008  . CAD (coronary artery disease) 11/22/2008  . Hyperlipidemia 03/02/2008  . ALLERGIC RHINITIS 03/02/2008  . OSA (obstructive sleep apnea) 03/02/2008    Past Surgical History:  Procedure Laterality Date  . ABDOMINAL AORTIC ANEURYSM REPAIR      May 2008,   . CARDIAC CATHETERIZATION  2006   @ [facility]  . CATARACT EXTRACTION     right  . CORONARY ARTERY BYPASS GRAFT     CABG with a  LIMA to the LAD, SVG to circumflex, SVG to posterolateral June2006.  Randolm Idol / REPLACE / REMOVE PACEMAKER  2006   Dr. [name]  . MITRAL VALVE REPAIR  2006    mitral valve repair with a #28 Edwards logic ring  . PACEMAKER PLACEMENT    . PERMANENT PACEMAKER GENERATOR CHANGE N/A 06/26/2012   Procedure: PERMANENT PACEMAKER GENERATOR CHANGE;  Surgeon: Deboraha Sprang, MD;  Location: [facility] CATH LAB;  Service: Cardiovascular;  Laterality: N/A;  . SKIN BIOPSY     left wrist; right elbow  . SKIN CANCER EXCISION      x 6  . SKIN CANCER EXCISION    . TRANSESOPHAGEAL ECHOCARDIOGRAM    . VASECTOMY      Prior to Admission medications   Medication Sig Start Date End Date Taking? Authorizing Provider  amiodarone (PACERONE) 200 MG tablet take 1 tablet by mouth once daily 05/08/16  Yes Minna Merritts, MD  apixaban  (ELIQUIS) 5 MG TABS tablet Take 5 mg by mouth 2 (two) times daily.    Yes Historical Provider, MD  calcitRIOL (ROCALTROL) 0.5 MCG capsule Take 0.5 mcg by mouth daily.   Yes Historical Provider, MD  carvedilol (COREG) 3.125 MG tablet Take 1 tablet (3.125 mg total) by mouth 2 (two) times daily with a meal. 06/05/16  Yes Minna Merritts, MD  cholecalciferol (VITAMIN D) 1000 UNITS tablet Take 1,000 Units by mouth daily.   Yes Historical Provider, MD  docusate sodium (COLACE) 100 MG capsule Take 100 mg by mouth 2 (two) times daily.     Yes Historical Provider, MD  finasteride (PROSCAR) 5 MG tablet Take 5 mg by mouth daily.   Yes Historical Provider, MD  furosemide (LASIX) 40 MG tablet Take 1 tablet (  40 mg total) by mouth every other day. 05/03/16  Yes Minna Merritts, MD  HUMALOG MIX 75/25 (75-25) 100 UNIT/ML SUSP injection inject 5 to 10 units subcutaneously twice a day Patient taking differently: inject 5 to 15 units subcutaneously twice a day 12/06/15  Yes Richard L Cranford Mon., MD  losartan (COZAAR) 25 MG tablet Take 25 mg by mouth daily.  09/14/14  Yes Historical Provider, MD  Multiple Vitamins-Minerals (MULTI-VITAMIN GUMMIES) CHEW Chew by mouth daily.   Yes Historical Provider, MD  omeprazole (PRILOSEC) 20 MG capsule Take 20 mg by mouth daily. Monday, Wednesday, friday   Yes Historical Provider, MD  rosuvastatin (CRESTOR) 20 MG tablet take 1 tablet by mouth at bedtime 10/04/15  Yes Minna Merritts, MD  tamsulosin (FLOMAX) 0.4 MG CAPS capsule Take 0.4 mg by mouth daily.   Yes Historical Provider, MD  ACCU-CHEK SMARTVIEW test strip  11/02/15   Historical Provider, MD  BD INSULIN SYRINGE ULTRAFINE 31G X 5/16" 0.3 ML MISC  02/12/16   Historical Provider, MD  BD INSULIN SYRINGE ULTRAFINE 31G X 5/16" 0.3 ML MISC use twice a day as directed 04/03/16   [name], MD    Allergies Shellfish allergy  Family History  Problem Relation Age of Onset  . Heart attack Mother 69  . Diabetes Sister   .  Hypertension Sister   . Heart attack Sister   . Heart attack Brother 46    3 Brothers deceased from [place] in 76's or 74's  . Heart disease Brother     Heart Disease before age 55  . Heart attack Father 23  . Diabetes Father     Amputation: bilateral feet  . Heart disease Father   . Heart attack Sister   . Heart attack Sister   . Heart attack Brother 18  . Heart attack Brother 50  . Diabetes Son     Social History Social History  Substance Use Topics  . Smoking status: Former Smoker    Packs/day: 1.50    Years: 34.00    Types: Cigarettes    Quit date: 08/28/1987  . Smokeless tobacco: Never Used  . Alcohol use 0.6 oz/week    1 Cans of beer per week     Comment: Occasionally but none since his surgery in June    Review of Systems  Constitutional: Negative for fever. Eyes: Negative for visual changes. ENT: Negative for sore throat. Cardiovascular: Negative for chest pain. Respiratory: Negative for shortness of breath. Gastrointestinal: Negative for abdominal pain, vomiting or diarrhea. Genitourinary: Negative for dysuria. + BRBPR Musculoskeletal: Negative for back pain. Skin: Negative for rash. Neurological: Negative for headaches, weakness or numbness.  ____________________________________________   PHYSICAL EXAM:  VITAL SIGNS: Vitals:   06/10/16 1800 06/10/16 1956  BP: (!) 167/91 (!) 154/85  Pulse: 74 75  Resp:  18  Temp:     Constitutional: Alert and oriented. Well appearing and in no apparent distress. HEENT:      Head: Normocephalic and atraumatic.         Eyes: Conjunctivae are normal. Sclera is non-icteric. EOMI. PERRL      Mouth/Throat: Mucous membranes are moist.       Neck: Supple with no signs of meningismus. Cardiovascular: Regular rate and rhythm. No murmurs, gallops, or rubs. 2+ symmetrical distal pulses are present in all extremities. No JVD. Respiratory: Normal respiratory effort. Lungs are clear to auscultation bilaterally. No wheezes,  crackles, or rhonchi.  Gastrointestinal: Soft, non tender, and non  distended with positive bowel sounds. No rebound or guarding. Genitourinary: No CVA tenderness.Rectal exam showing bright red blood, no stool in the vault. Musculoskeletal: Nontender with normal range of motion in all extremities. No edema, cyanosis, or erythema of extremities. Neurologic: Normal speech and language. Face is symmetric. Moving all extremities. No gross focal neurologic deficits are appreciated. Skin: Skin is warm, dry and intact. No rash noted. Psychiatric: Mood and affect are normal. Speech and behavior are normal.  ____________________________________________   LABS (all labs ordered are listed, but only abnormal results are displayed)  Labs Reviewed  COMPREHENSIVE METABOLIC PANEL - Abnormal; Notable for the following:       Result Value   Chloride 99 (*)    CO2 33 (*)    Glucose, Bld 136 (*)    BUN 47 (*)    Creatinine, Ser 2.81 (*)    Albumin 3.2 (*)    GFR calc non Af Amer 20 (*)    GFR calc Af Amer 23 (*)    All other components within normal limits  CBC - Abnormal; Notable for the following:    RBC 3.99 (*)    Hemoglobin 12.3 (*)    HCT 36.9 (*)    Platelets 146 (*)    All other components within normal limits  PROTIME-INR  URINALYSIS COMPLETEWITH MICROSCOPIC ([facility] ONLY)  POC OCCULT BLOOD, ED  TYPE AND SCREEN   ____________________________________________  EKG  ED ECG REPORT I, Rudene Re, the attending physician, personally viewed and interpreted this ECG.  Atrial paced rhythm, rate of 77, prolonged QTC, normal axis, no ST elevations or depressions. ____________________________________________  RADIOLOGY  none  ____________________________________________   PROCEDURES  Procedure(s) performed: None Procedures Critical Care performed:  None ____________________________________________   INITIAL IMPRESSION / ASSESSMENT AND PLAN / ED COURSE   80 y.o. male with  h/o AAA repaired in 2008, afib on Eliquis, anemia, CHF, CKD, CAD s/p CABG, DM, and diverticulosis who presents for evaluation of bright red blood per rectum since this morning. Patient on Eliquis and h/o diverticulosis. Rectal exam showing BRBPR with no melena. No ttp of the abdomen. HD stable. Probably diverticular bleed. Will observe on telemetry, chec basic labs and admit to medicine  Clinical Course    Pertinent labs & imaging results that were available during my care of the patient were reviewed by me and considered in my medical decision making (see chart for details).    ____________________________________________   FINAL CLINICAL IMPRESSION(S) / ED DIAGNOSES  Final diagnoses:  Lower GI bleeding      NEW MEDICATIONS STARTED DURING THIS VISIT:  New Prescriptions   No medications on file     Note:  This document was prepared using Dragon voice recognition software and may include unintentional dictation errors.    Rudene Re, MD 06/10/16 2044

## 2016-06-10 NOTE — ED Notes (Signed)
Second call for reports att, RN and charge in other pt's room, no RN available to take report per Network engineer

## 2016-06-10 NOTE — ED Notes (Signed)
Admitting MD at bedside.

## 2016-06-10 NOTE — ED Notes (Addendum)
Notified room ready att, first call to report, Luan Moore, RN to call back this RN in 5 min

## 2016-06-10 NOTE — ED Notes (Signed)
"  Pt placement" called to say that pt is to be "reassigned a room" d/t 1C "declining pt and not telling anyone"

## 2016-06-10 NOTE — H&P (Signed)
History and Physical    Marco Seegars Sr. [id number] DOB: 1936-04-21 DOA: 06/10/2016  Referring physician: Dr. Alfred Levins PCP: Wilhemena Durie, MD  Specialists: Dr. Rockey Situ  Chief Complaint: rectal bleeding  HPI: Marco Shawley Sr. is a 80 y.o. male has a past medical history significant for ASCVD, a-fib on Eliquis, and chronic respiratory failure on home O2 now with BRBPR. Feels weak. Denies chest or abdominal pain. SOB/respiratory status is stable. In ER, stool is guaiac positive and hgb is low. Denies hx of GI bleed. Denies hx of diverticulosis. He is now admitted.  Review of Systems: The patient denies anorexia, fever, weight loss,, vision loss, decreased hearing, hoarseness, chest pain, syncope, dyspnea on exertion, peripheral edema, balance deficits, hemoptysis, abdominal pain, melena,  severe indigestion/heartburn, hematuria, incontinence, genital sores, muscle weakness, suspicious skin lesions, transient blindness, difficulty walking, depression, unusual weight change, abnormal bleeding, enlarged lymph nodes, angioedema, and breast masses.   Past Medical History:  Diagnosis Date  . AAA (abdominal aortic aneurysm)repaired    a. 12/2006 s/p repair of 6cm infrarenal AAA with 76mm Dacron graft.  . Anemia   . Atrial fibrillation/Flutter    a. Dx 2014;  b. 10/2012 s/p TEE/DCCV;  c. On amio and Eliquis (CHA2DS2VASc = 6).  . Basal cell carcinoma    a. 2013 x 2 back and left neck  . BPH (benign prostatic hypertrophy)   . Carotid artery occlusion    a. 11/2008 s/p R CEA.  . Chronic systolic heart failure ([place])    a. 02/2012 Echo: EF 30-35%, Pulm HTN, and modest RV dysfunction;  b. 10/2012 TEE: EF 25-30%, mild conc LVH, diff HK, mod RV dysfxn, mod dil RA/LA, mild MR, no MS, nl MVR, mod TR, Ao sclerosis.  . CKD (chronic kidney disease), stage IV ([place])   . Coronary artery disease    a. 1999 PCI RCA;  b. 01/2005 NSTEMI: CABG x 3 (LIMA->LAD, VG->OM, VG->LPL) @ time of MVR.  [name]  DVT (deep venous thrombosis) ([place])   . GERD (gastroesophageal reflux disease)   . History of shingles   . Hyperlipidemia   . Hypertensive heart disease   . Mitral regurgitation--s/p repair    a. 01/2005 MVR: 28-mm Edwards ET Logix ring annuloplasty; b. 10/2012 TEE: normal fxning MV ring with mild MR, no MS.  . Mixed Ischemic and Non-ischemic Cardiomyopathy    a. 02/2012 Echo: EF 30-35%, Pulm HTN, and modest RV dysfunction;  b. 10/2012 TEE: EF 25-30%, mild conc LVH, diff HK, mod RV dysfxn, mod dil RA/LA, mild MR, no MS, nl MVR, mod TR, Ao sclerosis.  . Obesity   . PVC (premature ventricular contraction)    a. 02/2009 s/p RFCA (J Allred).  . Sick sinus syndrome ([place])    a. 01/2005 s/p PPM 2/2 heart block following CABG;  b. 05/2012 Gen Change: MDT Adapta ADDRL1 DC PPM, ser # [id number] H.  . Sleep apnea    a. Intolerant of CPAP  . Type II diabetes mellitus ([place])    Past Surgical History:  Procedure Laterality Date  . ABDOMINAL AORTIC ANEURYSM REPAIR      May 2008,   . CARDIAC CATHETERIZATION  2006   @ [facility]  . CATARACT EXTRACTION     right  . CORONARY ARTERY BYPASS GRAFT     CABG with a  LIMA to the LAD, SVG to circumflex, SVG to posterolateral June2006.  Randolm Idol / REPLACE / REMOVE PACEMAKER  2006   Dr. [name]  . MITRAL VALVE  REPAIR  2006    mitral valve repair with a #28 Edwards logic ring  . PACEMAKER PLACEMENT    . PERMANENT PACEMAKER GENERATOR CHANGE N/A 06/26/2012   Procedure: PERMANENT PACEMAKER GENERATOR CHANGE;  Surgeon: Deboraha Sprang, MD;  Location: [facility] CATH LAB;  Service: Cardiovascular;  Laterality: N/A;  . SKIN BIOPSY     left wrist; right elbow  . SKIN CANCER EXCISION      x 6  . SKIN CANCER EXCISION    . TRANSESOPHAGEAL ECHOCARDIOGRAM    . VASECTOMY     Social History:  reports that he quit smoking about 28 years ago. His smoking use included Cigarettes. He has a 51.00 pack-year smoking history. He has never used smokeless tobacco. He reports that he drinks about 0.6 oz  of alcohol per week . He reports that he does not use drugs.  Allergies  Allergen Reactions  . Shellfish Allergy Anaphylaxis    Family History  Problem Relation Age of Onset  . Heart attack Mother 14  . Diabetes Sister   . Hypertension Sister   . Heart attack Sister   . Heart attack Brother 8    3 Brothers deceased from [place] in 23's or 77's  . Heart disease Brother     Heart Disease before age 24  . Heart attack Father 30  . Diabetes Father     Amputation: bilateral feet  . Heart disease Father   . Heart attack Sister   . Heart attack Sister   . Heart attack Brother 64  . Heart attack Brother 13  . Diabetes Son     Prior to Admission medications   Medication Sig Start Date End Date Taking? Authorizing Provider  ACCU-CHEK SMARTVIEW test strip  11/02/15   Historical Provider, MD  amiodarone (PACERONE) 200 MG tablet take 1 tablet by mouth once daily 05/08/16   Minna Merritts, MD  apixaban (ELIQUIS) 5 MG TABS tablet Take 5 mg by mouth 2 (two) times daily.     Historical Provider, MD  BD INSULIN SYRINGE ULTRAFINE 31G X 5/16" 0.3 ML MISC  02/12/16   Historical Provider, MD  BD INSULIN SYRINGE ULTRAFINE 31G X 5/16" 0.3 ML MISC use twice a day as directed 04/03/16   [name], MD  calcitRIOL (ROCALTROL) 0.5 MCG capsule Take 0.5 mcg by mouth daily.    Historical Provider, MD  carvedilol (COREG) 3.125 MG tablet Take 1 tablet (3.125 mg total) by mouth 2 (two) times daily with a meal. 06/05/16   Minna Merritts, MD  cholecalciferol (VITAMIN D) 1000 UNITS tablet Take 1,000 Units by mouth daily.    Historical Provider, MD  docusate sodium (COLACE) 100 MG capsule Take 100 mg by mouth 2 (two) times daily.      Historical Provider, MD  finasteride (PROSCAR) 5 MG tablet Take 5 mg by mouth daily.    Historical Provider, MD  furosemide (LASIX) 40 MG tablet Take 1 tablet (40 mg total) by mouth every other day. 05/03/16   Minna Merritts, MD  HUMALOG MIX 75/25 (75-25) 100 UNIT/ML SUSP  injection inject 5 to 10 units subcutaneously twice a day Patient taking differently: inject 5 to 15 units subcutaneously twice a day 12/06/15   [name], MD  losartan (COZAAR) 25 MG tablet Take 25 mg by mouth daily.  09/14/14   Historical Provider, MD  Multiple Vitamins-Minerals (MULTI-VITAMIN GUMMIES) CHEW Chew by mouth daily.    Historical Provider, MD  omeprazole ([place])  20 MG capsule take 1 capsule by mouth once daily if needed 10/19/15   [name], MD  rosuvastatin (CRESTOR) 20 MG tablet take 1 tablet by mouth at bedtime 10/04/15   Minna Merritts, MD  tamsulosin [facility]) 0.4 MG CAPS capsule take 1 capsule by mouth once daily 11/01/15   [name], MD   Physical Exam: Vitals:   06/10/16 1529  BP: (!) 152/75  Pulse: 86  Resp: 16  Temp: 98.1 F (36.7 C)  SpO2: 91%     General:  No apparent distress, WDWN, [place]/AT  Eyes: PERRL, EOMI, no scleral icterus, conjunctiva clear  ENT: moist oropharynx without exudate, TM's benign, dentition good  Neck: supple, no lymphadenopathy. No bruits or thyromegaly  Cardiovascular: irregularly irregular without MRG; 2+ peripheral pulses, no JVD, trace peripheral edema  Respiratory: CTA biL, good air movement without wheezing, rhonchi or crackled. Respiratory effort normal  Abdomen: soft, non tender to palpation, positive bowel sounds, no guarding, no rebound  Skin: no rashes or lesions  Musculoskeletal: normal bulk and tone, no joint swelling  Psychiatric: normal mood and affect, A&OX3  Neurologic: CN 2-12 grossly intact, Motor strength 5/5 in all 4 groups with symmetric DTR's and non-focal sensory exam  Labs on Admission:  Basic Metabolic Panel:  Recent Labs Lab 06/10/16 1539  NA 139  K 4.2  CL 99*  CO2 33*  GLUCOSE 136*  BUN 47*  CREATININE 2.81*  CALCIUM 9.7   Liver Function Tests:  Recent Labs Lab 06/10/16 1539  AST 18  ALT 17  ALKPHOS 51  BILITOT 0.4  PROT 6.9  ALBUMIN 3.2*   No  results for input(s): LIPASE, AMYLASE in the last 168 hours. No results for input(s): AMMONIA in the last 168 hours. CBC:  Recent Labs Lab 06/10/16 1539  WBC 7.2  HGB 12.3*  HCT 36.9*  MCV 92.4  PLT 146*   Cardiac Enzymes: No results for input(s): CKTOTAL, CKMB, CKMBINDEX, TROPONINI in the last 168 hours.  BNP (last 3 results) No results for input(s): BNP in the last 8760 hours.  ProBNP (last 3 results) No results for input(s): PROBNP in the last 8760 hours.  CBG: No results for input(s): GLUCAP in the last 168 hours.  Radiological Exams on Admission: No results found.  EKG: Independently reviewed.  Assessment/Plan Principal Problem:   LGI bleed Active Problems:   CAD (coronary artery disease)   Acute blood loss anemia   Chronic respiratory failure with hypoxia, on home O2 therapy ([place])   Will admit to floor and begin IV fluids and Cipro/Flagyl. Telemetry. Consult GI and Cardiology. Hold Eliquis. Follow hgb closely.  Diet: clear liquid Fluids: NS@100  DVT Prophylaxis: none  Code Status: FULL  Family Communication: yes  Disposition Plan: home  Time spent: 50 min

## 2016-06-11 LAB — COMPREHENSIVE METABOLIC PANEL
ALK PHOS: 37 U/L — AB (ref 38–126)
ALT: 15 U/L — AB (ref 17–63)
AST: 15 U/L (ref 15–41)
Albumin: 2.8 g/dL — ABNORMAL LOW (ref 3.5–5.0)
Anion gap: 4 — ABNORMAL LOW (ref 5–15)
BUN: 40 mg/dL — AB (ref 6–20)
CALCIUM: 8.8 mg/dL — AB (ref 8.9–10.3)
CO2: 35 mmol/L — AB (ref 22–32)
CREATININE: 2.74 mg/dL — AB (ref 0.61–1.24)
Chloride: 101 mmol/L (ref 101–111)
GFR, EST AFRICAN AMERICAN: 24 mL/min — AB (ref >60)
GFR, EST NON AFRICAN AMERICAN: 21 mL/min — AB (ref >60)
Glucose, Bld: 137 mg/dL — ABNORMAL HIGH (ref 65–99)
Potassium: 3.9 mmol/L (ref 3.5–5.1)
SODIUM: 140 mmol/L (ref 135–145)
Total Bilirubin: 0.4 mg/dL (ref 0.3–1.2)
Total Protein: 6.2 g/dL — ABNORMAL LOW (ref 6.5–8.1)

## 2016-06-11 LAB — CBC
HCT: 33.8 % — ABNORMAL LOW (ref 40.0–52.0)
Hemoglobin: 11.2 g/dL — ABNORMAL LOW (ref 13.0–18.0)
MCH: 31.1 pg (ref 26.0–34.0)
MCHC: 33 g/dL (ref 32.0–36.0)
MCV: 94.3 fL (ref 80.0–100.0)
PLATELETS: 134 10*3/uL — AB (ref 150–440)
RBC: 3.59 MIL/uL — AB (ref 4.40–5.90)
RDW: 14 % (ref 11.5–14.5)
WBC: 5.1 10*3/uL (ref 3.8–10.6)

## 2016-06-11 LAB — GLUCOSE, CAPILLARY
GLUCOSE-CAPILLARY: 129 mg/dL — AB (ref 65–99)
GLUCOSE-CAPILLARY: 138 mg/dL — AB (ref 65–99)
Glucose-Capillary: 125 mg/dL — ABNORMAL HIGH (ref 65–99)
Glucose-Capillary: 197 mg/dL — ABNORMAL HIGH (ref 65–99)

## 2016-06-11 NOTE — Consult Note (Signed)
Consultation  Referring Provider:      Primary Care Physician:  Wilhemena Durie, MD Primary Gastroenterologist:    San Jetty MD     Reason for Consultation:    Hematochezia      Impression / Plan:   Hematochezia on Eliquis: Suspect hemorrhoidal bleed given the complaints of constipation and resolution in 12 hrs.  Differential also includes diverticular but unlikely from a colonic neoplasm or polyp.    For constipation recommend daily Miralax since all ready on Colace. If no recurrent bleeding, then no need for endoscopic intervention given clinical scenario and comorbidities. Consider restarting Eliquis in 48 hrs if no recurrent bleeding.           HPI:   Marco Bolerjack Sr. is a 80 y.o. male  male has a past medical history significant for ASCVD, A-fib on Eliquis, and chronic respiratory failure on home O2 presented to ER with one day of hematochezia.  Patient reports developed rectal bleeding after having a hard stool yesterday morning. Past CT scan of abdomen in 10-2015 noted scattered colonic diverticulosis. Patient also reports a history hemorrhoids.  Eliquis was stopped on admission. HGB dropped from 12.5 to 11.2 in past 24 hours. This morning patient report that his last BM was normal without bleeding. Past colonoscopy was > 10 years ago. Tolerating diet at this time.   Past Medical History:  Diagnosis Date  . AAA (abdominal aortic aneurysm)repaired    a. 12/2006 s/p repair of 6cm infrarenal AAA with 6mm Dacron graft.  . Anemia   . Atrial fibrillation/Flutter    a. Dx 2014;  b. 10/2012 s/p TEE/DCCV;  c. On amio and Eliquis (CHA2DS2VASc = 6).  . Basal cell carcinoma    a. 2013 x 2 back and left neck  . BPH (benign prostatic hypertrophy)   . Carotid artery occlusion    a. 11/2008 s/p R CEA.  . Chronic systolic heart failure ([place])    a. 02/2012 Echo: EF 30-35%, Pulm HTN, and modest RV dysfunction;  b. 10/2012 TEE: EF 25-30%, mild conc LVH, diff HK, mod RV dysfxn, mod dil  RA/LA, mild MR, no MS, nl MVR, mod TR, Ao sclerosis.  . CKD (chronic kidney disease), stage IV ([place])   . Coronary artery disease    a. 1999 PCI RCA;  b. 01/2005 NSTEMI: CABG x 3 (LIMA->LAD, VG->OM, VG->LPL) @ time of MVR.  [name] DVT (deep venous thrombosis) ([place])   . GERD (gastroesophageal reflux disease)   . History of shingles   . Hyperlipidemia   . Hypertensive heart disease   . Mitral regurgitation--s/p repair    a. 01/2005 MVR: 28-mm Edwards ET Logix ring annuloplasty; b. 10/2012 TEE: normal fxning MV ring with mild MR, no MS.  . Mixed Ischemic and Non-ischemic Cardiomyopathy    a. 02/2012 Echo: EF 30-35%, Pulm HTN, and modest RV dysfunction;  b. 10/2012 TEE: EF 25-30%, mild conc LVH, diff HK, mod RV dysfxn, mod dil RA/LA, mild MR, no MS, nl MVR, mod TR, Ao sclerosis.  . Obesity   . PVC (premature ventricular contraction)    a. 02/2009 s/p RFCA (J Allred).  . Sick sinus syndrome ([place])    a. 01/2005 s/p PPM 2/2 heart block following CABG;  b. 05/2012 Gen Change: MDT Adapta ADDRL1 DC PPM, ser # [id number] H.  . Sleep apnea    a. Intolerant of CPAP  . Type II diabetes mellitus ([place])     Past Surgical History:  Procedure Laterality Date  .  ABDOMINAL AORTIC ANEURYSM REPAIR      May 2008,   . CARDIAC CATHETERIZATION  2006   @ [facility]  . CATARACT EXTRACTION     right  . CORONARY ARTERY BYPASS GRAFT     CABG with a  LIMA to the LAD, SVG to circumflex, SVG to posterolateral June2006.  Randolm Idol / REPLACE / REMOVE PACEMAKER  2006   Dr. [name]  . MITRAL VALVE REPAIR  2006    mitral valve repair with a #28 Edwards logic ring  . PACEMAKER PLACEMENT    . PERMANENT PACEMAKER GENERATOR CHANGE N/A 06/26/2012   Procedure: PERMANENT PACEMAKER GENERATOR CHANGE;  Surgeon: Deboraha Sprang, MD;  Location: [facility] C[facility] LAB;  Service: Cardiovascular;  Laterality: N/A;  . SKIN BIOPSY     left wrist; right elbow  . SKIN CANCER EXCISION      x 6  . SKIN CANCER EXCISION    . TRANSESOPHAGEAL ECHOCARDIOGRAM    .  VASECTOMY      Family History  Problem Relation Age of Onset  . Heart attack Mother 9  . Diabetes Sister   . Hypertension Sister   . Heart attack Sister   . Heart attack Brother 62    3 Brothers deceased from [place] in 63's or 73's  . Heart disease Brother     Heart Disease before age 24  . Heart attack Father 93  . Diabetes Father     Amputation: bilateral feet  . Heart disease Father   . Heart attack Sister   . Heart attack Sister   . Heart attack Brother 78  . Heart attack Brother 106  . Diabetes Son       Social History  Substance Use Topics  . Smoking status: Former Smoker    Packs/day: 1.50    Years: 34.00    Types: Cigarettes    Quit date: 08/28/1987  . Smokeless tobacco: Never Used  . Alcohol use 0.6 oz/week    1 Cans of beer per week     Comment: Occasionally but none since his surgery in June    Prior to Admission medications   Medication Sig Start Date End Date Taking? Authorizing Provider  amiodarone (PACERONE) 200 MG tablet take 1 tablet by mouth once daily 05/08/16  Yes Minna Merritts, MD  apixaban (ELIQUIS) 5 MG TABS tablet Take 5 mg by mouth 2 (two) times daily.    Yes Historical Provider, MD  calcitRIOL (ROCALTROL) 0.5 MCG capsule Take 0.5 mcg by mouth daily.   Yes Historical Provider, MD  carvedilol (COREG) 3.125 MG tablet Take 1 tablet (3.125 mg total) by mouth 2 (two) times daily with a meal. 06/05/16  Yes Minna Merritts, MD  cholecalciferol (VITAMIN D) 1000 UNITS tablet Take 1,000 Units by mouth daily.   Yes Historical Provider, MD  docusate sodium (COLACE) 100 MG capsule Take 100 mg by mouth 2 (two) times daily.     Yes Historical Provider, MD  finasteride (PROSCAR) 5 MG tablet Take 5 mg by mouth daily.   Yes Historical Provider, MD  furosemide (LASIX) 40 MG tablet Take 1 tablet (40 mg total) by mouth every other day. 05/03/16  Yes Minna Merritts, MD  HUMALOG MIX 75/25 (75-25) 100 UNIT/ML SUSP injection inject 5 to 10 units subcutaneously twice a  day Patient taking differently: inject 5 to 15 units subcutaneously twice a day 12/06/15  Yes Richard Maceo Pro., MD  losartan (COZAAR) 25 MG tablet Take 25 mg by  mouth daily.  09/14/14  Yes Historical Provider, MD  Multiple Vitamins-Minerals (MULTI-VITAMIN GUMMIES) CHEW Chew by mouth daily.   Yes Historical Provider, MD  omeprazole (PRILOSEC) 20 MG capsule Take 20 mg by mouth daily. Monday, Wednesday, friday   Yes Historical Provider, MD  rosuvastatin (CRESTOR) 20 MG tablet take 1 tablet by mouth at bedtime 10/04/15  Yes Minna Merritts, MD  tamsulosin (FLOMAX) 0.4 MG CAPS capsule Take 0.4 mg by mouth daily.   Yes Historical Provider, MD  ACCU-CHEK SMARTVIEW test strip  11/02/15   Historical Provider, MD  BD INSULIN SYRINGE ULTRAFINE 31G X 5/16" 0.3 ML MISC  02/12/16   Historical Provider, MD  BD INSULIN SYRINGE ULTRAFINE 31G X 5/16" 0.3 ML MISC use twice a day as directed 04/03/16   [name], MD    Current Facility-Administered Medications  Medication Dose Route Frequency Provider Last Rate Last Dose  . 0.9 %  sodium chloride infusion   Intravenous Continuous Idelle Crouch, MD 100 mL/hr at 06/11/16 0820    . acetaminophen (TYLENOL) tablet 650 mg  650 mg Oral Q6H PRN Idelle Crouch, MD       Or  . acetaminophen (TYLENOL) suppository 650 mg  650 mg Rectal Q6H PRN Idelle Crouch, MD      . amiodarone (PACERONE) tablet 200 mg  200 mg Oral Daily Idelle Crouch, MD      . calcitRIOL (ROCALTROL) capsule 0.5 mcg  0.5 mcg Oral Daily Idelle Crouch, MD      . carvedilol (COREG) tablet 3.125 mg  3.125 mg Oral BID WC Idelle Crouch, MD   3.125 mg at 06/11/16 0824  . cholecalciferol (VITAMIN D) tablet 1,000 Units  1,000 Units Oral Daily Idelle Crouch, MD      . ciprofloxacin (CIPRO) IVPB 400 mg  400 mg Intravenous Q24H Idelle Crouch, MD   Stopped at 06/10/16 1942  . famotidine (PEPCID) IVPB 20 mg premix  20 mg Intravenous Q12H Idelle Crouch, MD   20 mg at 06/10/16 2300  .  finasteride (PROSCAR) tablet 5 mg  5 mg Oral Daily Idelle Crouch, MD      . Derrill Memo ON 06/12/2016] furosemide (LASIX) tablet 40 mg  40 mg Oral QODAY Idelle Crouch, MD      . HYDROcodone-acetaminophen (NORCO/VICODIN) 5-325 MG per tablet 1-2 tablet  1-2 tablet Oral Q4H PRN Idelle Crouch, MD   1 tablet at 06/11/16 0526  . HYDROmorphone (DILAUDID) injection 1 mg  1 mg Intravenous Q2H PRN Idelle Crouch, MD   1 mg at 06/11/16 0012  . insulin aspart (novoLOG) injection 0-9 Units  0-9 Units Subcutaneous TID WC Idelle Crouch, MD   1 Units at 06/11/16 [phone]  . losartan (COZAAR) tablet 25 mg  25 mg Oral Daily Idelle Crouch, MD      . metroNIDAZOLE (FLAGYL) tablet 500 mg  500 mg Oral Q8H Idelle Crouch, MD   500 mg at 06/11/16 0509  . multivitamin with minerals tablet   Oral Daily Idelle Crouch, MD      . ondansetron [facility]) tablet 4 mg  4 mg Oral Q6H PRN Idelle Crouch, MD       Or  . ondansetron [facility]) injection 4 mg  4 mg Intravenous Q6H PRN Idelle Crouch, MD      . Derrill Memo ON 06/12/2016] pantoprazole (PROTONIX) EC tablet 40 mg  40 mg Oral Once per day on  Mon Wed Fri Idelle Crouch, MD      . polyethylene glycol [facility] Floria Raveling) packet 17 g  17 g Oral Daily PRN Idelle Crouch, MD      . rosuvastatin (CRESTOR) tablet 20 mg  20 mg Oral QHS Idelle Crouch, MD   20 mg at 06/10/16 2301  . sodium chloride flush (NS) 0.9 % injection 3 mL  3 mL Intravenous Q12H Idelle Crouch, MD   3 mL at 06/10/16 2300  . tamsulosin (FLOMAX) capsule 0.4 mg  0.4 mg Oral Daily Idelle Crouch, MD   0.4 mg at 06/10/16 2301    Allergies as of 06/10/2016 - Review Complete 06/10/2016  Allergen Reaction Noted  . Shellfish allergy Anaphylaxis 01/24/2011     Review of Systems:    This is positive for those things mentioned in the HPI, . All other review of systems are negative.       Physical Exam:  Vital signs in last 24 hours: Temp:  [97.8 F (36.6 C)-98.1 F (36.7 C)] 97.8 F  (36.6 C) (10/15 0508) Pulse Rate:  [59-86] 59 (10/15 0508) Resp:  [16-20] 20 (10/15 0508) BP: (113-167)/(42-91) 118/60 (10/15 0824) SpO2:  [91 %-100 %] 100 % (10/15 0508) Weight:  [114.3 kg (252 lb)-119.7 kg (264 lb)] 114.3 kg (252 lb) (10/15 0442) Last BM Date: 06/10/16  General:  Well-developed, well-nourished and in no acute distress Eyes:  anicteric. ENT:   Mouth and posterior pharynx free of lesions.  Neck:   supple w/o thyromegaly or mass.  Lungs: Clear to auscultation bilaterally. Heart:  S1S2, no rubs, murmurs, gallops. Abdomen:  soft, non-tender, no hepatosplenomegaly or hernia, rounded with bulging flanks, and BS+.  Rectal: Lymph:  no cervical or supraclavicular adenopathy. Extremities:   no edema Skin   no rash. Neuro:  A&O x 3.  Psych:  appropriate mood and  Affect.   Data Reviewed:   LAB RESULTS:  Recent Labs  06/10/16 1539 06/10/16 2126 06/11/16 0343  WBC 7.2  --  5.1  HGB 12.3* 12.4* 11.2*  HCT 36.9*  --  33.8*  PLT 146*  --  134*   BMET  Recent Labs  06/10/16 1539 06/11/16 0343  NA 139 140  K 4.2 3.9  CL 99* 101  CO2 33* 35*  GLUCOSE 136* 137*  BUN 47* 40*  CREATININE 2.81* 2.74*  CALCIUM 9.7 8.8*   LFT  Recent Labs  06/11/16 0343  PROT 6.2*  ALBUMIN 2.8*  AST 15  ALT 15*  ALKPHOS 37*  BILITOT 0.4   PT/INR  Recent Labs  06/10/16 1539  LABPROT 14.2  INR 1.10    STUDIES: No results found.   PREVIOUS ENDOSCOPIES:               Thanks   LOS: 1 day   San Jetty MD  @  06/11/2016, 10:35 AM

## 2016-06-11 NOTE — Care Management Important Message (Signed)
Important Message  Patient Details  Name: Marco Beissel Sr. MRN: [id number] Date of Birth: [birth year]   Medicare Important Message Given:  Yes    [name], RN 06/11/2016, 2:52 PM

## 2016-06-11 NOTE — Progress Notes (Addendum)
MD gave orders to discontinue cardiology consult and telemetry. Diet changed to full liquid.  IVF discontinued

## 2016-06-11 NOTE — Care Management Note (Addendum)
Case Management Note  Patient Details  Name: Marco Wysong Sr. MRN: [id number] Date of Birth: 1936/08/13  Subjective/Objective:         80yo Mr Marco [name] was admitted per rectal bleeding, Hgb=11.2. Guaiac positive. Hx: diverticulosis. Chronic Eliquis and chronic home oxygen. Uses a walker and has a rolling walker, cane, and transport wheelchair at home. Wife provides transportation. PCP= Dr Rosanna Randy. Pharmacy= [facility] in [place].  Case management will follow for discharge planning.          Action/Plan:   Expected Discharge Date:                  Expected Discharge Plan:     In-House Referral:     Discharge planning Services     Post Acute Care Choice:    Choice offered to:     DME Arranged:    DME Agency:     HH Arranged:    HH Agency:     Status of Service:     If discussed at [employer] of Stay Meetings, dates discussed:    Additional Comments:  Lilymae Swiech A, RN 06/11/2016, 1:14 PM

## 2016-06-11 NOTE — Progress Notes (Addendum)
[facility] [place] NAME: Marco [name]    MR#:  [id number]  DATE OF BIRTH:  [birth year]  SUBJECTIVE:  Came in with bloody stool small amount after being constipated  REVIEW OF SYSTEMS:   Review of Systems  Constitutional: Negative for chills, fever and weight loss.  HENT: Negative for ear discharge, ear pain and nosebleeds.   Eyes: Negative for blurred vision, pain and discharge.  Respiratory: Negative for sputum production, shortness of breath, wheezing and stridor.   Cardiovascular: Negative for chest pain, palpitations, orthopnea and PND.  Gastrointestinal: Positive for blood in stool. Negative for abdominal pain, diarrhea, nausea and vomiting.  Genitourinary: Negative for frequency and urgency.  Musculoskeletal: Negative for back pain and joint pain.  Neurological: Positive for weakness. Negative for sensory change, speech change and focal weakness.  Psychiatric/Behavioral: Negative for depression and hallucinations. The patient is not nervous/anxious.    Tolerating Diet:CLD Tolerating PT: pending  DRUG ALLERGIES:   Allergies  Allergen Reactions  . Shellfish Allergy Anaphylaxis    VITALS:  Blood pressure 118/60, pulse (!) 59, temperature 97.8 F (36.6 C), temperature source Oral, resp. rate 20, height 6' (1.829 m), weight 114.3 kg (252 lb), SpO2 100 %.  PHYSICAL EXAMINATION:   Physical Exam  GENERAL:  80 y.o.-year-old patient lying in the bed with no acute distress.  EYES: Pupils equal, round, reactive to light and accommodation. No scleral icterus. Extraocular muscles intact.  HEENT: Head atraumatic, normocephalic. Oropharynx and nasopharynx clear.  NECK:  Supple, no jugular venous distention. No thyroid enlargement, no tenderness.  LUNGS: Normal breath sounds bilaterally, no wheezing, rales, rhonchi. No use of accessory muscles of respiration.  CARDIOVASCULAR: S1, S2 normal. No murmurs, rubs, or gallops.   ABDOMEN: Soft, nontender, nondistended. Bowel sounds present. No organomegaly or mass.  EXTREMITIES: No cyanosis, clubbing or edema b/l.    NEUROLOGIC: Cranial nerves II through XII are intact. No focal Motor or sensory deficits b/l.   PSYCHIATRIC:  patient is alert and oriented x 3.  SKIN: No obvious rash, lesion, or ulcer.   LABORATORY PANEL:  CBC  Recent Labs Lab 06/11/16 0343  WBC 5.1  HGB 11.2*  HCT 33.8*  PLT 134*    Chemistries   Recent Labs Lab 06/11/16 0343  NA 140  K 3.9  CL 101  CO2 35*  GLUCOSE 137*  BUN 40*  CREATININE 2.74*  CALCIUM 8.8*  AST 15  ALT 15*  ALKPHOS 37*  BILITOT 0.4    ASSESSMENT AND PLAN:   Danney Hanney Sr. is a 80 y.o. male has a past medical history significant for ASCVD, a-fib on Eliquis, and chronic respiratory failure on home O2 now with BRBPR. Feels weak. Denies chest or abdominal pain. SOB/respiratory status is stable. In ER, stool is guaiac positive and hgb is low. Denies hx of GI bleed  1. Hematochezia due to constipation and straining to have BM with pt being on eliquis -seen by GI -no further bleeding so far -d/c empiric abxs -Eliquis on hold --no active bleeding -hold eliquis today  2.Chronic Afib -rate controlled -holding eliquis  3. Chronic constipation -stool softners and po miralax  4.CKD-IV Followed by Dr Holley Raring. Last GFR per wife with office labs was 15 cc/min -creat at baseline -avoid nephrotoxins   Case discussed with Care Management/Social Worker. Management plans discussed with the patient, family and they are in agreement.  CODE STATUS: full  DVT Prophylaxis: SCD/teds TOTAL TIME TAKING CARE OF THIS  PATIENT: 30 minutes.  >50% time spent on counselling and coordination of care pt and wife  POSSIBLE D/C IN 1-2 DAYS, DEPENDING ON CLINICAL CONDITION.  Note: This dictation was prepared with Dragon dictation along with smaller phrase technology. Any transcriptional errors that result from  this process are unintentional.  Matina Rodier M.D on 06/11/2016 at 11:17 AM  Between 7am to 6pm - Pager - [phone]  After 6pm go to [email or web address] - password [email or web address] [facility]  Office  [phone]  CC: Primary care physician; Wilhemena Durie, MD

## 2016-06-12 DIAGNOSIS — E1165 Type 2 diabetes mellitus with hyperglycemia: Secondary | ICD-10-CM

## 2016-06-12 DIAGNOSIS — Z9114 Patient's other noncompliance with medication regimen: Secondary | ICD-10-CM | POA: Diagnosis not present

## 2016-06-12 DIAGNOSIS — I5022 Chronic systolic (congestive) heart failure: Secondary | ICD-10-CM

## 2016-06-12 DIAGNOSIS — Z7901 Long term (current) use of anticoagulants: Secondary | ICD-10-CM | POA: Diagnosis not present

## 2016-06-12 DIAGNOSIS — D649 Anemia, unspecified: Secondary | ICD-10-CM | POA: Diagnosis not present

## 2016-06-12 DIAGNOSIS — I480 Paroxysmal atrial fibrillation: Secondary | ICD-10-CM | POA: Diagnosis not present

## 2016-06-12 DIAGNOSIS — G4733 Obstructive sleep apnea (adult) (pediatric): Secondary | ICD-10-CM

## 2016-06-12 LAB — GLUCOSE, CAPILLARY: Glucose-Capillary: 149 mg/dL — ABNORMAL HIGH (ref 65–99)

## 2016-06-12 LAB — HEMOGLOBIN: Hemoglobin: 11.6 g/dL — ABNORMAL LOW (ref 13.0–18.0)

## 2016-06-12 MED ORDER — APIXABAN 5 MG PO TABS: 5.0000 mg | ORAL_TABLET | Freq: Two times a day (BID) | ORAL | 0 refills | Status: DC

## 2016-06-12 MED ORDER — POLYETHYLENE GLYCOL 3350 17 G PO PACK: 17.0000 g | PACK | Freq: Every day | ORAL | 0 refills | Status: AC | PRN

## 2016-06-12 NOTE — Progress Notes (Signed)
Patient discharge teaching given, including activity, diet, follow-up appoints, and medications. Patient verbalized understanding of all discharge instructions. IV access was d/c'd. Vitals are stable. Skin is intact except as charted in most recent assessments. Pt to be escorted out by NT, to be driven home by family.  Marco [name]  

## 2016-06-12 NOTE — Discharge Instructions (Signed)
° °  Gastrointestinal Bleeding Gastrointestinal bleeding is bleeding somewhere along the path that food travels through the body (digestive tract). This path is anywhere between the mouth and the opening of the butt (anus). You may have blood in your throw up (vomit) or in your poop (stools). If there is a lot of bleeding, you may need to stay in the hospital. [place]  Only take medicine as told by your doctor.  Eat foods with fiber such as whole grains, fruits, and vegetables. You can also try eating 1 to 3 prunes a day.  Drink enough fluids to keep your pee (urine) clear or pale yellow. GET HELP RIGHT AWAY IF:   Your bleeding gets worse.  You feel dizzy, weak, or you pass out (faint).  You have bad cramps in your back or belly (abdomen).  You have large blood clumps (clots) in your poop.  Your problems are getting worse. MAKE SURE YOU:   Understand these instructions.  Will watch your condition.  Will get help right away if you are not doing well or get worse.   This information is not intended to replace advice given to you by your health care provider. Make sure you discuss any questions you have with your health care provider.   Document Released: 05/23/2008 Document Revised: 07/31/2012 Document Reviewed: 02/01/2015 Elsevier Interactive Patient Education 2016 [place] [facility]

## 2016-06-12 NOTE — Progress Notes (Signed)
Pt was alert and talkative.  Pt answered many questions about his family, career, etc.  Pt does not attend church due to health challenges but watches worship services on Tv.  He said he had requested a visit from a [facility] but no order had been placed.  Nurses entered to administer meds and take blood pressure and check osygen levels. Nurse indicated Pt would be ready for discharge as soon as paperwork was checked.  CH said prayer for Pt. CH is available for follow up if needed before discharge today.   06/12/16 1055  Clinical Encounter Type  Visited With Patient  Visit Type Initial;Spiritual support  Referral From Nurse  Spiritual Encounters  Spiritual Needs Prayer;Emotional  Stress Factors  Patient Stress Factors None identified

## 2016-06-12 NOTE — Discharge Summary (Signed)
[employer] [facility] [place] NAME: Marco [name]    MR#:  [id number]  DATE OF BIRTH:  1935/10/05  DATE OF ADMISSION:  06/10/2016 ADMITTING PHYSICIAN: Idelle Crouch, MD  DATE OF DISCHARGE: 06/12/16  PRIMARY CARE PHYSICIAN: Wilhemena Durie, MD    ADMISSION DIAGNOSIS:  Lower GI bleeding [K92.2]  DISCHARGE DIAGNOSIS:  Hematochezia due to constipation-resolved Chronic afib on eliquis(to be resumed from 06/14/16) CKD-III/IV  SECONDARY DIAGNOSIS:   Past Medical History:  Diagnosis Date  . AAA (abdominal aortic aneurysm)repaired    a. 12/2006 s/p repair of 6cm infrarenal AAA with 34mm Dacron graft.  . Anemia   . Atrial fibrillation/Flutter    a. Dx 2014;  b. 10/2012 s/p TEE/DCCV;  c. On amio and Eliquis (CHA2DS2VASc = 6).  . Basal cell carcinoma    a. 2013 x 2 back and left neck  . BPH (benign prostatic hypertrophy)   . Carotid artery occlusion    a. 11/2008 s/p R CEA.  . Chronic systolic heart failure ([place])    a. 02/2012 Echo: EF 30-35%, Pulm HTN, and modest RV dysfunction;  b. 10/2012 TEE: EF 25-30%, mild conc LVH, diff HK, mod RV dysfxn, mod dil RA/LA, mild MR, no MS, nl MVR, mod TR, Ao sclerosis.  . CKD (chronic kidney disease), stage IV ([place])   . Coronary artery disease    a. 1999 PCI RCA;  b. 01/2005 NSTEMI: CABG x 3 (LIMA->LAD, VG->OM, VG->LPL) @ time of MVR.  [name] DVT (deep venous thrombosis) ([place])   . GERD (gastroesophageal reflux disease)   . History of shingles   . Hyperlipidemia   . Hypertensive heart disease   . Mitral regurgitation--s/p repair    a. 01/2005 MVR: 28-mm Edwards ET Logix ring annuloplasty; b. 10/2012 TEE: normal fxning MV ring with mild MR, no MS.  . Mixed Ischemic and Non-ischemic Cardiomyopathy    a. 02/2012 Echo: EF 30-35%, Pulm HTN, and modest RV dysfunction;  b. 10/2012 TEE: EF 25-30%, mild conc LVH, diff HK, mod RV dysfxn, mod dil RA/LA, mild MR, no MS, nl MVR, mod TR, Ao sclerosis.  . Obesity   . PVC  (premature ventricular contraction)    a. 02/2009 s/p RFCA ([facility]).  . Sick sinus syndrome ([place])    a. 01/2005 s/p PPM 2/2 heart block following CABG;  b. 05/2012 Gen Change: MDT Adapta ADDRL1 DC PPM, ser # [id number].  . Sleep apnea    a. Intolerant of CPAP  . Type II diabetes mellitus [facility])     HOSPITAL COURSE:    Marco Thompsonis a 80 y.o.malehas a past medical history significant for ASCVD, a-fib on Eliquis, and chronic respiratory failure on home O2 now with BRBPR. Feels weak. Denies chest or abdominal pain. SOB/respiratory status is stable. In ER, stool is guaiac positive and hgb is low. Denies hx of GI bleed  1. Hematochezia due to constipation and straining to have BM with pt being on eliquis -seen by GI -no further bleeding so far -d/c empiric abxs -Eliquis on hold till 06/14/16---d/w GI and pt and wife. If recurs then d/w cardiology about it --no active bleeding. hgb stable at 11.6 -hold eliquis today  2.Chronic Afib -rate controlled -holding eliquis  3. Chronic constipation -stool softners and po miralax  4.CKD-IV Followed by Dr Holley Raring. Last GFR per wife with office labs was 15 cc/min -creat at baseline -avoid nephrotoxins CONSULTS OBTAINED:  Treatment Team:  San Jetty, MD  DRUG  ALLERGIES:   Allergies  Allergen Reactions  . Shellfish Allergy Anaphylaxis    DISCHARGE MEDICATIONS:   Current Discharge Medication List    START taking these medications   Details  polyethylene glycol (MIRALAX / GLYCOLAX) packet Take 17 g by mouth daily as needed for mild constipation. Qty: 14 each, Refills: 0      CONTINUE these medications which have CHANGED   Details  apixaban (ELIQUIS) 5 MG TABS tablet Take 1 tablet (5 mg total) by mouth 2 (two) times daily. Qty: 60 tablet, Refills: 0      CONTINUE these medications which have NOT CHANGED   Details  amiodarone (PACERONE) 200 MG tablet take 1 tablet by mouth once daily Qty: 30 tablet, Refills:  3    calcitRIOL (ROCALTROL) 0.5 MCG capsule Take 0.5 mcg by mouth daily.    carvedilol (COREG) 3.125 MG tablet Take 1 tablet (3.125 mg total) by mouth 2 (two) times daily with a meal. Qty: 180 tablet, Refills: 3    cholecalciferol (VITAMIN D) 1000 UNITS tablet Take 1,000 Units by mouth daily.    docusate sodium (COLACE) 100 MG capsule Take 100 mg by mouth 2 (two) times daily.      finasteride (PROSCAR) 5 MG tablet Take 5 mg by mouth daily.    furosemide (LASIX) 40 MG tablet Take 1 tablet (40 mg total) by mouth every other day. Qty: 60 tablet, Refills: 12    HUMALOG MIX 75/25 (75-25) 100 UNIT/ML SUSP injection inject 5 to 10 units subcutaneously twice a day Qty: 10 vial, Refills: 5    losartan (COZAAR) 25 MG tablet Take 25 mg by mouth daily.  Refills: 0    Multiple Vitamins-Minerals (MULTI-VITAMIN GUMMIES) CHEW Chew by mouth daily.    omeprazole (PRILOSEC) 20 MG capsule Take 20 mg by mouth daily. Monday, Wednesday, friday    rosuvastatin (CRESTOR) 20 MG tablet take 1 tablet by mouth at bedtime Qty: 90 tablet, Refills: 3    tamsulosin (FLOMAX) 0.4 MG CAPS capsule Take 0.4 mg by mouth daily.    ACCU-CHEK SMARTVIEW test strip Refills: 1    !! BD INSULIN SYRINGE ULTRAFINE 31G X 5/16" 0.3 ML MISC Refills: 1    !! BD INSULIN SYRINGE ULTRAFINE 31G X 5/16" 0.3 ML MISC use twice a day as directed Qty: 100 each, Refills: 12     !! - Potential duplicate medications found. Please discuss with provider.      If you experience worsening of your admission symptoms, develop shortness of breath, life threatening emergency, suicidal or homicidal thoughts you must seek medical attention immediately by calling 911 or calling your MD immediately  if symptoms less severe.  You Must read complete instructions/literature along with all the possible adverse reactions/side effects for all the Medicines you take and that have been prescribed to you. Take any new Medicines after you have completely  understood and accept all the possible adverse reactions/side effects.   Please note  You were cared for by a hospitalist during your hospital stay. If you have any questions about your discharge medications or the care you received while you were in the hospital after you are discharged, you can call the unit and asked to speak with the hospitalist on call if the hospitalist that took care of you is not available. Once you are discharged, your primary care physician will handle any further medical issues. Please note that NO REFILLS for any discharge medications will be authorized once you are discharged, as it is imperative  that you return to your primary care physician (or establish a relationship with a primary care physician if you do not have one) for your aftercare needs so that they can reassess your need for medications and monitor your lab values. Today   SUBJECTIVE   I feel better  VITAL SIGNS:  Blood pressure (!) 115/57, pulse 75, temperature 98.3 F (36.8 C), temperature source Axillary, resp. rate 20, height 6' (1.829 m), weight 116.8 kg (257 lb 9.6 oz), SpO2 97 %.  I/O:   Intake/Output Summary (Last 24 hours) at 06/12/16 1018 Last data filed at 06/12/16 1009  Gross per 24 hour  Intake              460 ml  Output              550 ml  Net              -90 ml    PHYSICAL EXAMINATION:  GENERAL:  80 y.o.-year-old patient lying in the bed with no acute distress. obese EYES: Pupils equal, round, reactive to light and accommodation. No scleral icterus. Extraocular muscles intact.  HEENT: Head atraumatic, normocephalic. Oropharynx and nasopharynx clear.  NECK:  Supple, no jugular venous distention. No thyroid enlargement, no tenderness.  LUNGS: Normal breath sounds bilaterally, no wheezing, rales,rhonchi or crepitation. No use of accessory muscles of respiration.  CARDIOVASCULAR: S1, S2 normal. No murmurs, rubs, or gallops.  ABDOMEN: Soft, non-tender, non-distended. Bowel sounds  present. No organomegaly or mass.  EXTREMITIES: No pedal edema, cyanosis, or clubbing.  NEUROLOGIC: Cranial nerves II through XII are intact. Muscle strength 5/5 in all extremities. Sensation intact. Gait not checked.  PSYCHIATRIC: The patient is alert and oriented x 3.  SKIN: No obvious rash, lesion, or ulcer.   DATA REVIEW:   CBC   Recent Labs Lab 06/11/16 0343 06/12/16 0405  WBC 5.1  --   HGB 11.2* 11.6*  HCT 33.8*  --   PLT 134*  --     Chemistries   Recent Labs Lab 06/11/16 0343  NA 140  K 3.9  CL 101  CO2 35*  GLUCOSE 137*  BUN 40*  CREATININE 2.74*  CALCIUM 8.8*  AST 15  ALT 15*  ALKPHOS 37*  BILITOT 0.4    Microbiology Results   No results found for this or any previous visit (from the past 240 hour(s)).  RADIOLOGY:  No results found.   Management plans discussed with the patient, family and they are in agreement.  CODE STATUS:     Code Status Orders        Start     Ordered   06/10/16 2110  Full code  Continuous     06/10/16 2109    Code Status History    Date Active Date Inactive Code Status Order ID Comments User Context   10/28/2015 11:14 AM 10/29/2015  3:01 PM Full Code [id number]  Hillary Bow, MD ED    Advance Directive Documentation   Flowsheet Row Most Recent Value  Type of Advance Directive  Healthcare Power of Attorney, Living will  Pre-existing out of facility DNR order (yellow form or pink MOST form)  No data  "MOST" Form in Place?  No data      TOTAL TIME TAKING CARE OF THIS PATIENT: 40 minutes.    Marco [name] M.D on 06/12/2016 at 10:18 AM  Between 7am to 6pm - Pager - [phone] After 6pm go to [email or web address] - password [email or web address] [facility]  Office  [phone]  CC: Primary care physician; Wilhemena Durie, MD

## 2016-06-12 NOTE — Care Management (Signed)
Patient to discharge home today.  Patient open with Marco [name] for RN, PT, and SW.  MD to order resumption of home health.  Christie [facility] notified of discharge plan.  RNCM signing off

## 2016-06-13 ENCOUNTER — Ambulatory Visit: Payer: Medicare Other | Admitting: Urology

## 2016-06-14 ENCOUNTER — Telehealth: Payer: Self-pay

## 2016-06-14 DIAGNOSIS — I480 Paroxysmal atrial fibrillation: Secondary | ICD-10-CM | POA: Diagnosis not present

## 2016-06-14 DIAGNOSIS — Z9114 Patient's other noncompliance with medication regimen: Secondary | ICD-10-CM | POA: Diagnosis not present

## 2016-06-14 DIAGNOSIS — I5022 Chronic systolic (congestive) heart failure: Secondary | ICD-10-CM | POA: Diagnosis not present

## 2016-06-14 DIAGNOSIS — Z7901 Long term (current) use of anticoagulants: Secondary | ICD-10-CM | POA: Diagnosis not present

## 2016-06-14 DIAGNOSIS — D649 Anemia, unspecified: Secondary | ICD-10-CM | POA: Diagnosis not present

## 2016-06-14 NOTE — Telephone Encounter (Signed)
[facility] @ [facility] is requesting verbal order for PT 1 time a week for 1 week and 2 times a week for 4 weeks. CB# [phone]

## 2016-06-14 NOTE — Telephone Encounter (Signed)
Please review. Marco [name], CMA  

## 2016-06-15 NOTE — Telephone Encounter (Signed)
Marco [name] advised-aa 

## 2016-06-15 NOTE — Telephone Encounter (Signed)
ok 

## 2016-06-16 DIAGNOSIS — Z9114 Patient's other noncompliance with medication regimen: Secondary | ICD-10-CM | POA: Diagnosis not present

## 2016-06-16 DIAGNOSIS — I5022 Chronic systolic (congestive) heart failure: Secondary | ICD-10-CM | POA: Diagnosis not present

## 2016-06-16 DIAGNOSIS — Z7901 Long term (current) use of anticoagulants: Secondary | ICD-10-CM | POA: Diagnosis not present

## 2016-06-16 DIAGNOSIS — I480 Paroxysmal atrial fibrillation: Secondary | ICD-10-CM | POA: Diagnosis not present

## 2016-06-16 DIAGNOSIS — D649 Anemia, unspecified: Secondary | ICD-10-CM | POA: Diagnosis not present

## 2016-06-20 DIAGNOSIS — I5022 Chronic systolic (congestive) heart failure: Secondary | ICD-10-CM | POA: Diagnosis not present

## 2016-06-20 DIAGNOSIS — D649 Anemia, unspecified: Secondary | ICD-10-CM | POA: Diagnosis not present

## 2016-06-20 DIAGNOSIS — Z7901 Long term (current) use of anticoagulants: Secondary | ICD-10-CM | POA: Diagnosis not present

## 2016-06-20 DIAGNOSIS — Z9114 Patient's other noncompliance with medication regimen: Secondary | ICD-10-CM | POA: Diagnosis not present

## 2016-06-20 DIAGNOSIS — I480 Paroxysmal atrial fibrillation: Secondary | ICD-10-CM | POA: Diagnosis not present

## 2016-06-21 DIAGNOSIS — Z7901 Long term (current) use of anticoagulants: Secondary | ICD-10-CM | POA: Diagnosis not present

## 2016-06-21 DIAGNOSIS — I5022 Chronic systolic (congestive) heart failure: Secondary | ICD-10-CM | POA: Diagnosis not present

## 2016-06-21 DIAGNOSIS — Z9114 Patient's other noncompliance with medication regimen: Secondary | ICD-10-CM | POA: Diagnosis not present

## 2016-06-21 DIAGNOSIS — D649 Anemia, unspecified: Secondary | ICD-10-CM | POA: Diagnosis not present

## 2016-06-21 DIAGNOSIS — I480 Paroxysmal atrial fibrillation: Secondary | ICD-10-CM | POA: Diagnosis not present

## 2016-06-23 DIAGNOSIS — I480 Paroxysmal atrial fibrillation: Secondary | ICD-10-CM | POA: Diagnosis not present

## 2016-06-23 DIAGNOSIS — D649 Anemia, unspecified: Secondary | ICD-10-CM | POA: Diagnosis not present

## 2016-06-23 DIAGNOSIS — I5022 Chronic systolic (congestive) heart failure: Secondary | ICD-10-CM | POA: Diagnosis not present

## 2016-06-23 DIAGNOSIS — Z7901 Long term (current) use of anticoagulants: Secondary | ICD-10-CM | POA: Diagnosis not present

## 2016-06-23 DIAGNOSIS — Z9114 Patient's other noncompliance with medication regimen: Secondary | ICD-10-CM | POA: Diagnosis not present

## 2016-06-26 ENCOUNTER — Encounter: Payer: Self-pay | Admitting: Family Medicine

## 2016-06-26 ENCOUNTER — Ambulatory Visit (INDEPENDENT_AMBULATORY_CARE_PROVIDER_SITE_OTHER): Payer: Medicare Other | Admitting: Family Medicine

## 2016-06-26 VITALS — BP 100/54 | HR 80 | Temp 98.2°F | Resp 16

## 2016-06-26 DIAGNOSIS — I10 Essential (primary) hypertension: Secondary | ICD-10-CM

## 2016-06-26 DIAGNOSIS — G479 Sleep disorder, unspecified: Secondary | ICD-10-CM | POA: Diagnosis not present

## 2016-06-26 DIAGNOSIS — K922 Gastrointestinal hemorrhage, unspecified: Secondary | ICD-10-CM | POA: Diagnosis not present

## 2016-06-26 NOTE — Patient Instructions (Signed)
Check with your pharmacist if it is okay to take OTC Melatonin with amiodarone for sleep disturbance.

## 2016-06-26 NOTE — Progress Notes (Signed)
Patient: Marco [name]. Male    DOB: June 19, 1936   80 y.o.   MRN: [id number] Visit Date: 06/26/2016  Today's Provider: Wilhemena Durie, MD   Chief Complaint  Patient presents with  . Hospitalization Follow-up   Subjective:    HPI     Follow up Hospitalization  Patient was admitted to [facility] on 06/11/2015 and discharged on 06/12/2016. He was treated for hematochezia due to constipation (lower GI bleed). Treatment for this included Glycolax. He reports excellent compliance with treatment. He reports this condition is Improved. Pt reports he has not noticed any blood in the last 2 weeks. Is c/o weakness.  ------------------------------------------------------------------------------------  Sleep Disturbance Pt reports he wakes up the night to urinate (sometimes twice, one at 0230 and again at 0400), and finds it difficult to fall back asleep. Is requesting a sleep aid.He is very unsteady with walking with a walker and this is even worse at night.   Allergies  Allergen Reactions  . Shellfish Allergy Anaphylaxis     Current Outpatient Prescriptions:  .  ACCU-CHEK SMARTVIEW test strip, , Disp: , Rfl: 1 .  amiodarone (PACERONE) 200 MG tablet, take 1 tablet by mouth once daily, Disp: 30 tablet, Rfl: 3 .  apixaban (ELIQUIS) 5 MG TABS tablet, Take 1 tablet (5 mg total) by mouth 2 (two) times daily., Disp: 60 tablet, Rfl: 0 .  BD INSULIN SYRINGE ULTRAFINE 31G X 5/16" 0.3 ML MISC, , Disp: , Rfl: 1 .  BD INSULIN SYRINGE ULTRAFINE 31G X 5/16" 0.3 ML MISC, use twice a day as directed, Disp: 100 each, Rfl: 12 .  calcitRIOL (ROCALTROL) 0.5 MCG capsule, Take 0.5 mcg by mouth daily., Disp: , Rfl:  .  carvedilol (COREG) 3.125 MG tablet, Take 1 tablet (3.125 mg total) by mouth 2 (two) times daily with a meal., Disp: 180 tablet, Rfl: 3 .  cholecalciferol (VITAMIN D) 1000 UNITS tablet, Take 1,000 Units by mouth daily., Disp: , Rfl:  .  docusate sodium (COLACE) 100 MG  capsule, Take 100 mg by mouth 2 (two) times daily.  , Disp: , Rfl:  .  finasteride (PROSCAR) 5 MG tablet, Take 5 mg by mouth daily., Disp: , Rfl:  .  furosemide (LASIX) 40 MG tablet, Take 1 tablet (40 mg total) by mouth every other day., Disp: 60 tablet, Rfl: 12 .  HUMALOG MIX 75/25 (75-25) 100 UNIT/ML SUSP injection, inject 5 to 10 units subcutaneously twice a day (Patient taking differently: inject 5 to 15 units subcutaneously twice a day), Disp: 10 vial, Rfl: 5 .  losartan (COZAAR) 25 MG tablet, Take 25 mg by mouth daily. , Disp: , Rfl: 0 .  Multiple Vitamins-Minerals (MULTI-VITAMIN GUMMIES) CHEW, Chew by mouth daily., Disp: , Rfl:  .  omeprazole (PRILOSEC) 20 MG capsule, Take 20 mg by mouth daily. Monday, Wednesday, friday, Disp: , Rfl:  .  polyethylene glycol (MIRALAX / GLYCOLAX) packet, Take 17 g by mouth daily as needed for mild constipation., Disp: 14 each, Rfl: 0 .  rosuvastatin (CRESTOR) 20 MG tablet, take 1 tablet by mouth at bedtime, Disp: 90 tablet, Rfl: 3 .  tamsulosin (FLOMAX) 0.4 MG CAPS capsule, Take 0.4 mg by mouth daily., Disp: , Rfl:  .  triamcinolone (NASACORT ALLERGY 24HR) 55 MCG/ACT AERO nasal inhaler, Place 2 sprays into the nose daily., Disp: , Rfl:   Review of Systems  Constitutional: Positive for activity change and fatigue. Negative for appetite change, chills, diaphoresis, fever and unexpected  weight change.  Eyes: Negative.   Cardiovascular: Positive for leg swelling. Negative for chest pain and palpitations.  Gastrointestinal: Negative for abdominal pain, anal bleeding, blood in stool, constipation and rectal pain.  Endocrine: Positive for polyuria.  Genitourinary: Positive for frequency.  Allergic/Immunologic: Negative.   Neurological: Positive for weakness.  Psychiatric/Behavioral: Positive for sleep disturbance.    Social History  Substance Use Topics  . Smoking status: Former Smoker    Packs/day: 1.50    Years: 34.00    Types: Cigarettes    Quit date:  08/28/1987  . Smokeless tobacco: Never Used  . Alcohol use 0.6 oz/week    1 Cans of beer per week     Comment: Occasionally but none since his surgery in June   Objective:   BP (!) 100/54 (BP Location: Left Arm, Patient Position: Sitting, Cuff Size: Large)   Pulse 80   Temp 98.2 F (36.8 C) (Oral)   Resp 16   Physical Exam  Constitutional: He appears well-developed and well-nourished.  HENT:  Head: Normocephalic and atraumatic.  Right Ear: External ear normal.  Left Ear: External ear normal.  Nose: Nose normal.  Eyes: Conjunctivae are normal. No scleral icterus.  Neck: No thyromegaly present.  Cardiovascular: Normal rate, regular rhythm and normal heart sounds.   Pulmonary/Chest: Effort normal and breath sounds normal. No respiratory distress.  Abdominal: Soft. There is no tenderness.  Neurological: He is alert. No cranial nerve deficit.  Psychiatric: He has a normal mood and affect. His behavior is normal. Judgment and thought content normal.        Assessment & Plan:     1. Sleep disturbance Advised pt sleep aid can increase risk of falls. Tried Trazodone, but interacts with amiodarone. Advised pt to ask pharmacist if OTC Melatonin would interact with amiodarone. If not, can try Melatonin for sleep.Never at length it is not worth the risk of Ambien causing falls for the patient to take this. Especially that he has trouble falling back asleep. This would not help with this issue.  2. Essential hypertension Is low today. Check labs as below. - Renal function panel  3. LGI bleed Stable. Check labs. - CBC with Differential/Platelet    4. CAD 5. Atrial fib Continue liquids distal bite GI bleed is benefit still outweigh risk. Recurrence of GI bleed might change this decision going forward 6.Morbid obesity Major issue for this patient as he is having great difficulty moving because of his size. I think the cause of this the patient will continue to have slowly failing  health. Unable to change his habits through the years. Patient seen and examined by Miguel Aschoff, MD, and note scribed by Renaldo Fiddler, CMA.  I have done the exam and reviewed the above chart and it is accurate to the best of my knowledge.  Aikam Hellickson Cranford Mon, MD  [facility]

## 2016-06-27 LAB — CBC WITH DIFFERENTIAL/PLATELET
BASOS ABS: 0 10*3/uL (ref 0.0–0.2)
Basos: 1 %
EOS (ABSOLUTE): 0.4 10*3/uL (ref 0.0–0.4)
Eos: 5 %
HEMOGLOBIN: 11.7 g/dL — AB (ref 12.6–17.7)
Hematocrit: 35.1 % — ABNORMAL LOW (ref 37.5–51.0)
Immature Grans (Abs): 0 10*3/uL (ref 0.0–0.1)
Immature Granulocytes: 0 %
LYMPHS ABS: 1.5 10*3/uL (ref 0.7–3.1)
LYMPHS: 19 %
MCH: 30.7 pg (ref 26.6–33.0)
MCHC: 33.3 g/dL (ref 31.5–35.7)
MCV: 92 fL (ref 79–97)
MONOCYTES: 9 %
Monocytes Absolute: 0.7 10*3/uL (ref 0.1–0.9)
Neutrophils Absolute: 5.3 10*3/uL (ref 1.4–7.0)
Neutrophils: 66 %
PLATELETS: 182 10*3/uL (ref 150–379)
RBC: 3.81 x10E6/uL — AB (ref 4.14–5.80)
RDW: 14.3 % (ref 12.3–15.4)
WBC: 7.9 10*3/uL (ref 3.4–10.8)

## 2016-06-27 LAB — RENAL FUNCTION PANEL
ALBUMIN: 3.3 g/dL — AB (ref 3.5–4.8)
BUN / CREAT RATIO: 15 (ref 10–24)
BUN: 43 mg/dL — ABNORMAL HIGH (ref 8–27)
CHLORIDE: 99 mmol/L (ref 96–106)
CO2: 36 mmol/L — AB (ref 18–29)
Calcium: 9.6 mg/dL (ref 8.6–10.2)
Creatinine, Ser: 2.82 mg/dL — ABNORMAL HIGH (ref 0.76–1.27)
GFR, EST AFRICAN AMERICAN: 24 mL/min/{1.73_m2} — AB (ref >59)
GFR, EST NON AFRICAN AMERICAN: 21 mL/min/{1.73_m2} — AB (ref >59)
GLUCOSE: 129 mg/dL — AB (ref 65–99)
POTASSIUM: 4.7 mmol/L (ref 3.5–5.2)
Phosphorus: 3 mg/dL (ref 2.5–4.5)
SODIUM: 145 mmol/L — AB (ref 134–144)

## 2016-06-28 DIAGNOSIS — Z7901 Long term (current) use of anticoagulants: Secondary | ICD-10-CM | POA: Diagnosis not present

## 2016-06-28 DIAGNOSIS — Z9114 Patient's other noncompliance with medication regimen: Secondary | ICD-10-CM | POA: Diagnosis not present

## 2016-06-28 DIAGNOSIS — D649 Anemia, unspecified: Secondary | ICD-10-CM | POA: Diagnosis not present

## 2016-06-28 DIAGNOSIS — I5022 Chronic systolic (congestive) heart failure: Secondary | ICD-10-CM | POA: Diagnosis not present

## 2016-06-28 DIAGNOSIS — I480 Paroxysmal atrial fibrillation: Secondary | ICD-10-CM | POA: Diagnosis not present

## 2016-06-30 DIAGNOSIS — I5022 Chronic systolic (congestive) heart failure: Secondary | ICD-10-CM | POA: Diagnosis not present

## 2016-06-30 DIAGNOSIS — Z7901 Long term (current) use of anticoagulants: Secondary | ICD-10-CM | POA: Diagnosis not present

## 2016-06-30 DIAGNOSIS — Z9114 Patient's other noncompliance with medication regimen: Secondary | ICD-10-CM | POA: Diagnosis not present

## 2016-06-30 DIAGNOSIS — I480 Paroxysmal atrial fibrillation: Secondary | ICD-10-CM | POA: Diagnosis not present

## 2016-06-30 DIAGNOSIS — D649 Anemia, unspecified: Secondary | ICD-10-CM | POA: Diagnosis not present

## 2016-07-03 ENCOUNTER — Other Ambulatory Visit: Payer: Self-pay | Admitting: Family Medicine

## 2016-07-04 ENCOUNTER — Ambulatory Visit (INDEPENDENT_AMBULATORY_CARE_PROVIDER_SITE_OTHER): Payer: Medicare Other | Admitting: *Deleted

## 2016-07-04 ENCOUNTER — Telehealth: Payer: Self-pay | Admitting: Cardiology

## 2016-07-04 DIAGNOSIS — I495 Sick sinus syndrome: Secondary | ICD-10-CM

## 2016-07-04 NOTE — Telephone Encounter (Signed)
Spoke with pt and reminded pt of remote transmission that is due today. Pt verbalized understanding.   

## 2016-07-05 ENCOUNTER — Telehealth: Payer: Self-pay | Admitting: Cardiovascular Disease

## 2016-07-05 ENCOUNTER — Other Ambulatory Visit: Payer: Self-pay | Admitting: Family Medicine

## 2016-07-05 ENCOUNTER — Telehealth: Payer: Self-pay

## 2016-07-05 ENCOUNTER — Encounter: Payer: Self-pay | Admitting: Cardiology

## 2016-07-05 DIAGNOSIS — Z9114 Patient's other noncompliance with medication regimen: Secondary | ICD-10-CM | POA: Diagnosis not present

## 2016-07-05 DIAGNOSIS — I480 Paroxysmal atrial fibrillation: Secondary | ICD-10-CM | POA: Diagnosis not present

## 2016-07-05 DIAGNOSIS — Z7901 Long term (current) use of anticoagulants: Secondary | ICD-10-CM | POA: Diagnosis not present

## 2016-07-05 DIAGNOSIS — D649 Anemia, unspecified: Secondary | ICD-10-CM | POA: Diagnosis not present

## 2016-07-05 DIAGNOSIS — I5022 Chronic systolic (congestive) heart failure: Secondary | ICD-10-CM | POA: Diagnosis not present

## 2016-07-05 NOTE — Telephone Encounter (Signed)
Patients wife reports that physical therapy is currently there and requested that I speak with him. I spoke with Skeet Simmer PT and he states that patient's blood pressure was 110/60 while sitting and then standing was 80/50. He was dizzy with no energy. He states that he did range of motion exercises with him today while in the home. Let him know that I would forward to Dr. Rockey Situ to make him aware and if there are any changes I will give his wife a call back. Instructed them to take precautions when transitioning from different positions. He verbalized understanding and had no further questions at this time.

## 2016-07-05 NOTE — Telephone Encounter (Signed)
Stop Losartan   

## 2016-07-05 NOTE — Progress Notes (Signed)
Remote pacemaker transmission.   

## 2016-07-05 NOTE — Telephone Encounter (Signed)
Pt's home health nurse with [facility], called because pt is experiencing low blood pressures upon standing. Bp was 80/49 last night. Nurse checked his BP today, and it went from 116/60 (sitting) to 80/50 after standing. Pt still taking all medications, including Losartan 25 mg and carvedilol 3.125 mg BID. Lamuel requested we call pt back with advise. Please advise. Renaldo Fiddler, CMA

## 2016-07-05 NOTE — Telephone Encounter (Signed)
Pt wife states the PT is at the home now, and pt BP is dropping. BP is 110/60 sitting, HR 74  80/50 when standing. Please call.

## 2016-07-06 NOTE — Telephone Encounter (Signed)
Pt advised-aa 

## 2016-07-08 DIAGNOSIS — Z7901 Long term (current) use of anticoagulants: Secondary | ICD-10-CM | POA: Diagnosis not present

## 2016-07-08 DIAGNOSIS — I480 Paroxysmal atrial fibrillation: Secondary | ICD-10-CM | POA: Diagnosis not present

## 2016-07-08 DIAGNOSIS — D649 Anemia, unspecified: Secondary | ICD-10-CM | POA: Diagnosis not present

## 2016-07-08 DIAGNOSIS — I5022 Chronic systolic (congestive) heart failure: Secondary | ICD-10-CM | POA: Diagnosis not present

## 2016-07-08 DIAGNOSIS — Z9114 Patient's other noncompliance with medication regimen: Secondary | ICD-10-CM | POA: Diagnosis not present

## 2016-07-08 NOTE — Telephone Encounter (Signed)
For low blood pressure/orthostasis Would recommend he decrease carvedilol down to 3.125 mg a.m. only, no p.m. Dose Lasix every third day not every other day Losartan every other day Would monitor blood pressure sitting, standing

## 2016-07-10 DIAGNOSIS — M9903 Segmental and somatic dysfunction of lumbar region: Secondary | ICD-10-CM | POA: Diagnosis not present

## 2016-07-10 DIAGNOSIS — M9901 Segmental and somatic dysfunction of cervical region: Secondary | ICD-10-CM | POA: Diagnosis not present

## 2016-07-10 DIAGNOSIS — M5033 Other cervical disc degeneration, cervicothoracic region: Secondary | ICD-10-CM | POA: Diagnosis not present

## 2016-07-10 DIAGNOSIS — M5136 Other intervertebral disc degeneration, lumbar region: Secondary | ICD-10-CM | POA: Diagnosis not present

## 2016-07-10 NOTE — Telephone Encounter (Signed)
Left message for [name] to call back. 

## 2016-07-10 NOTE — Telephone Encounter (Signed)
Spoke w/ [name].  Advised her of Dr. Donivan Scull recommendation.  She verbalizes understanding and will continue to monitor.   Asked her to call back w/ any other questions or concerns.

## 2016-07-13 ENCOUNTER — Ambulatory Visit (INDEPENDENT_AMBULATORY_CARE_PROVIDER_SITE_OTHER): Payer: Medicare Other | Admitting: Family Medicine

## 2016-07-13 ENCOUNTER — Encounter: Payer: Self-pay | Admitting: Family Medicine

## 2016-07-13 VITALS — BP 92/56 | HR 80 | Temp 98.2°F | Resp 16 | Wt 249.0 lb

## 2016-07-13 DIAGNOSIS — I509 Heart failure, unspecified: Secondary | ICD-10-CM | POA: Diagnosis not present

## 2016-07-13 DIAGNOSIS — G479 Sleep disorder, unspecified: Secondary | ICD-10-CM

## 2016-07-13 DIAGNOSIS — R531 Weakness: Secondary | ICD-10-CM

## 2016-07-13 DIAGNOSIS — N184 Chronic kidney disease, stage 4 (severe): Secondary | ICD-10-CM | POA: Diagnosis not present

## 2016-07-13 DIAGNOSIS — I951 Orthostatic hypotension: Secondary | ICD-10-CM

## 2016-07-13 DIAGNOSIS — E1121 Type 2 diabetes mellitus with diabetic nephropathy: Secondary | ICD-10-CM

## 2016-07-13 NOTE — Progress Notes (Signed)
Rajvir Jaci Standard Debord Sr.  MRN: [id number] DOB: [birth year]  Subjective:  HPI  Patient is here for follow up after last visit on 06/26/16. For sleep patient is not taking anything right now, they have not checked with pharmacy if Melatonin would be safe with his other medications. He was hypotensive last time and since then and they called Dr Rockey Situ and changed some medication directions-take Lasix 3 times a week, Carvedilol take 1 tablet in the morning only and 1 in the evening every other day, Losartan to take 1 tablet every other day. His b/p yesterday sitting was 107/52 and standing 104/50. He feels better when b/p is not so low. Pt kicked PT out last week--he is unwilling to do the work to get stronger. BP Readings from Last 3 Encounters:  07/13/16 (!) 92/56  06/26/16 (!) 100/54  06/12/16 (!) 116/49    Patient Active Problem List   Diagnosis Date Noted  . LGI bleed 06/10/2016  . Acute blood loss anemia 06/10/2016  . Chronic respiratory failure with hypoxia, on home O2 therapy ([place]) 06/10/2016  . PAD (peripheral artery disease) ([place]) 06/05/2016  . Chest pain 10/28/2015  . Gastroenteritis   . Diarrhea of infectious origin   . Emesis   . S/P CABG (coronary artery bypass graft)   . Coronary artery disease involving native coronary artery of native heart without angina pectoris   . PAF (paroxysmal atrial fibrillation) ([place])   . Absolute anemia 01/28/2015  . Atherosclerosis of coronary artery 01/28/2015  . Barrett's esophagus 01/28/2015  . Basal cell carcinoma of face 01/28/2015  . Benign fibroma of prostate 01/28/2015  . CCF (congestive cardiac failure) ([place]) 01/28/2015  . Diabetes ([place]) 01/28/2015  . Gastro-esophageal reflux disease without esophagitis 01/28/2015  . Irritable colon 01/28/2015  . Cannot sleep 01/28/2015  . Pleural cavity effusion 01/28/2015  . Apnea, sleep 01/28/2015  . Hematuria 12/21/2014  . Left hand weakness 09/23/2013  . Essential hypertension  06/23/2013  . Chronic systolic heart failure ([place]) 06/10/2013  . Hypotension, postural 06/10/2013  . Hyperkalemia 06/10/2013  . Adjustment of cardiac pacemaker 06/10/2013  . Atrial fibrillation and flutter ([place]) 10/22/2012  . Pacemaker-Medtronic 06/19/2012  . Pulmonary hypertension 03/18/2012  . Occlusion and stenosis of carotid artery without mention of cerebral infarction 02/08/2012  . Morbid obesity ([place]) 08/05/2010  . Sinoatrial node dysfunction (HCC) 06/02/2010  . Stage 4 chronic renal impairment associated with type 2 diabetes mellitus ([place]) 08/09/2009  . AAA 04/21/2009  . Ischemic cardiomyopathy  s/p CABG 11/23/2008  . MITRAL REGURGITATION 11/22/2008  . CAD (coronary artery disease) 11/22/2008  . Hyperlipidemia 03/02/2008  . ALLERGIC RHINITIS 03/02/2008  . OSA (obstructive sleep apnea) 03/02/2008    Past Medical History:  Diagnosis Date  . AAA (abdominal aortic aneurysm)repaired    a. 12/2006 s/p repair of 6cm infrarenal AAA with 34mm Dacron graft.  . Anemia   . Atrial fibrillation/Flutter    a. Dx 2014;  b. 10/2012 s/p TEE/DCCV;  c. On amio and Eliquis (CHA2DS2VASc = 6).  . Basal cell carcinoma    a. 2013 x 2 back and left neck  . BPH (benign prostatic hypertrophy)   . Carotid artery occlusion    a. 11/2008 s/p R CEA.  . Chronic systolic heart failure ([place])    a. 02/2012 Echo: EF 30-35%, Pulm HTN, and modest RV dysfunction;  b. 10/2012 TEE: EF 25-30%, mild conc LVH, diff HK, mod RV dysfxn, mod dil RA/LA, mild MR, no MS, nl MVR, mod TR, Ao  sclerosis.  . CKD (chronic kidney disease), stage IV ([place])   . Coronary artery disease    a. 1999 PCI RCA;  b. 01/2005 NSTEMI: CABG x 3 (LIMA->LAD, VG->OM, VG->LPL) @ time of MVR.  [name] DVT (deep venous thrombosis) ([place])   . GERD (gastroesophageal reflux disease)   . History of shingles   . Hyperlipidemia   . Hypertensive heart disease   . Mitral regurgitation--s/p repair    a. 01/2005 MVR: 28-mm Edwards ET Logix ring annuloplasty; b.  10/2012 TEE: normal fxning MV ring with mild MR, no MS.  . Mixed Ischemic and Non-ischemic Cardiomyopathy    a. 02/2012 Echo: EF 30-35%, Pulm HTN, and modest RV dysfunction;  b. 10/2012 TEE: EF 25-30%, mild conc LVH, diff HK, mod RV dysfxn, mod dil RA/LA, mild MR, no MS, nl MVR, mod TR, Ao sclerosis.  . Obesity   . PVC (premature ventricular contraction)    a. 02/2009 s/p RFCA (J Allred).  . Sick sinus syndrome ([place])    a. 01/2005 s/p PPM 2/2 heart block following CABG;  b. 05/2012 Gen Change: MDT Adapta ADDRL1 DC PPM, ser # [id number] H.  . Sleep apnea    a. Intolerant of CPAP  . Type II diabetes mellitus ([place])     Social History   Social History  . Marital status: Married    Spouse name: N/A  . Number of children: N/A  . Years of education: N/A   Occupational History  . Retired     Engineer, structural in [place]  .  Retired   Social History Main Topics  . Smoking status: Former Smoker    Packs/day: 1.50    Years: 34.00    Types: Cigarettes    Quit date: 08/28/1987  . Smokeless tobacco: Never Used  . Alcohol use 0.6 oz/week    1 Cans of beer per week     Comment: Occasionally but none since his surgery in June  . Drug use: No  . Sexual activity: No   Other Topics Concern  . Not on file   Social History Narrative  . No narrative on file    Outpatient Encounter Prescriptions as of 07/13/2016  Medication Sig  . ACCU-CHEK FASTCLIX LANCETS MISC TEST twice a day if needed  . ACCU-CHEK SMARTVIEW test strip   . ACCU-CHEK SMARTVIEW test strip TEST twice a day  . amiodarone (PACERONE) 200 MG tablet take 1 tablet by mouth once daily  . apixaban (ELIQUIS) 5 MG TABS tablet Take 1 tablet (5 mg total) by mouth 2 (two) times daily.  . BD INSULIN SYRINGE ULTRAFINE 31G X 5/16" 0.3 ML MISC   . BD INSULIN SYRINGE ULTRAFINE 31G X 5/16" 0.3 ML MISC use twice a day as directed  . calcitRIOL (ROCALTROL) 0.5 MCG capsule take 1 capsule by mouth once daily  . carvedilol (COREG) 3.125 MG tablet  Take 1 tablet (3.125 mg total) by mouth 2 (two) times daily with a meal.  . cholecalciferol (VITAMIN D) 1000 UNITS tablet Take 1,000 Units by mouth daily.  [name] docusate sodium (COLACE) 100 MG capsule Take 100 mg by mouth 2 (two) times daily.    . finasteride (PROSCAR) 5 MG tablet Take 5 mg by mouth daily.  . furosemide (LASIX) 40 MG tablet Take 1 tablet (40 mg total) by mouth every other day.  [name] HUMALOG MIX 75/25 (75-25) 100 UNIT/ML SUSP injection inject 5 to 10 units subcutaneously twice a day (Patient taking differently: inject 5 to 15 units subcutaneously twice  a day)  . Multiple Vitamins-Minerals (MULTI-VITAMIN GUMMIES) CHEW Chew by mouth daily.  [name] omeprazole (PRILOSEC) 20 MG capsule Take 20 mg by mouth daily. Monday, Wednesday, friday  . polyethylene glycol (MIRALAX / GLYCOLAX) packet Take 17 g by mouth daily as needed for mild constipation.  . rosuvastatin (CRESTOR) 20 MG tablet take 1 tablet by mouth at bedtime  . tamsulosin (FLOMAX) 0.4 MG CAPS capsule Take 0.4 mg by mouth daily.  [name] triamcinolone (NASACORT ALLERGY 24HR) 55 MCG/ACT AERO nasal inhaler Place 2 sprays into the nose daily.   No facility-administered encounter medications on file as of 07/13/2016.     Allergies  Allergen Reactions  . Shellfish Allergy Anaphylaxis    Review of Systems  Constitutional: Positive for malaise/fatigue.  HENT: Negative.   Respiratory: Negative.   Cardiovascular: Negative.   Gastrointestinal: Negative.   Musculoskeletal: Positive for back pain.       Unsteady on his feet some.  Skin: Negative.   Neurological: Positive for dizziness and weakness.  Endo/Heme/Allergies: Negative.   Psychiatric/Behavioral: The patient does not have insomnia.     Objective:  BP (!) 92/56   Pulse 80   Temp 98.2 F (36.8 C)   Resp 16   Wt 249 lb (112.9 kg) Comment: per patient on 07/12/16  BMI 33.77 kg/m   Physical Exam  Constitutional: He is oriented to person, place, and time and well-developed,  well-nourished, and in no distress.  Morbidly Obese WM NAD sitting in wheelchair.  HENT:  Head: Normocephalic and atraumatic.  Right Ear: External ear normal.  Left Ear: External ear normal.  Nose: Nose normal.  Cardiovascular: Normal rate, regular rhythm, normal heart sounds and intact distal pulses.   No murmur heard. Pulmonary/Chest: Effort normal and breath sounds normal. No respiratory distress. He has no wheezes.  Abdominal: Soft.  Musculoskeletal: He exhibits edema.  Neurological: He is alert and oriented to person, place, and time.  Skin: Skin is warm and dry.  Psychiatric: Mood, memory, affect and judgment normal.    Assessment and Plan :  1. Hypotension, postural Follow. Patient see Dr Rockey Situ. No medication changes today.  2. Sleep disturbance Stable off the medication for now. Follow.  3. Type 2 diabetes mellitus with diabetic nephropathy, without long-term current use of insulin ([place]) Check on last visit.  4. Chronic renal failure, stage 4 (severe) (HCC)/Diabetic Nephrolpathy Follows Dr Holley Raring. 5. Weakness Discussed in details that patient needs to stay active and physical therapy would be beneficial, patient states he can not perform certain exercises. Weight is an issue here too. Explained the importance of continuing to move around and walk.More than 50% of 25 minute visit spent in counselling regarding these measures. 6.Morbid Obesity Through the years pt has been unwilling to make lifestyle changes,he is only willing to make meds. 7.Ischemic Cardiomyopathy HPI, Exam and A&P transcribed under direction and in the presence of Miguel Aschoff, MD. I have done the exam and reviewed the chart and it is accurate to the best of my knowledge. Development worker, community has been used and  any errors in dictation or transcription are unintentional. Miguel Aschoff M.D. [facility]

## 2016-07-17 DIAGNOSIS — Z794 Long term (current) use of insulin: Secondary | ICD-10-CM | POA: Diagnosis not present

## 2016-07-17 DIAGNOSIS — B351 Tinea unguium: Secondary | ICD-10-CM | POA: Diagnosis not present

## 2016-07-17 DIAGNOSIS — L97521 Non-pressure chronic ulcer of other part of left foot limited to breakdown of skin: Secondary | ICD-10-CM | POA: Diagnosis not present

## 2016-07-17 DIAGNOSIS — E1140 Type 2 diabetes mellitus with diabetic neuropathy, unspecified: Secondary | ICD-10-CM | POA: Diagnosis not present

## 2016-07-18 ENCOUNTER — Other Ambulatory Visit (INDEPENDENT_AMBULATORY_CARE_PROVIDER_SITE_OTHER): Payer: Self-pay | Admitting: Vascular Surgery

## 2016-07-18 DIAGNOSIS — I6523 Occlusion and stenosis of bilateral carotid arteries: Secondary | ICD-10-CM

## 2016-07-24 ENCOUNTER — Encounter (INDEPENDENT_AMBULATORY_CARE_PROVIDER_SITE_OTHER): Payer: Self-pay

## 2016-07-24 ENCOUNTER — Ambulatory Visit (INDEPENDENT_AMBULATORY_CARE_PROVIDER_SITE_OTHER): Payer: Self-pay | Admitting: Vascular Surgery

## 2016-07-24 ENCOUNTER — Other Ambulatory Visit (INDEPENDENT_AMBULATORY_CARE_PROVIDER_SITE_OTHER): Payer: Self-pay | Admitting: Vascular Surgery

## 2016-07-24 DIAGNOSIS — I6523 Occlusion and stenosis of bilateral carotid arteries: Secondary | ICD-10-CM

## 2016-07-31 ENCOUNTER — Other Ambulatory Visit: Payer: Self-pay | Admitting: Family Medicine

## 2016-08-01 LAB — CUP PACEART REMOTE DEVICE CHECK
Battery Remaining Longevity: 112 mo
Battery Voltage: 2.79 V
Brady Statistic AP VS Percent: 93 %
Brady Statistic AS VS Percent: 0 %
Implantable Lead Location: 753860
Implantable Lead Model: 5076
Lead Channel Impedance Value: 447 Ohm
Lead Channel Pacing Threshold Pulse Width: 0.4 ms
Lead Channel Pacing Threshold Pulse Width: 0.4 ms
Lead Channel Setting Pacing Amplitude: 2 V
Lead Channel Setting Pacing Pulse Width: 0.4 ms
Lead Channel Setting Sensing Sensitivity: 4 mV
MDC IDC LEAD IMPLANT DT: 20060615
MDC IDC LEAD IMPLANT DT: 20060615
MDC IDC LEAD LOCATION: 753859
MDC IDC MSMT BATTERY IMPEDANCE: 204 Ohm
MDC IDC MSMT LEADCHNL RA IMPEDANCE VALUE: 435 Ohm
MDC IDC MSMT LEADCHNL RA PACING THRESHOLD AMPLITUDE: 0.5 V
MDC IDC MSMT LEADCHNL RV PACING THRESHOLD AMPLITUDE: 1.125 V
MDC IDC PG IMPLANT DT: 20131030
MDC IDC SESS DTM: 20171107184906
MDC IDC SET LEADCHNL RV PACING AMPLITUDE: 2.5 V
MDC IDC STAT BRADY AP VP PERCENT: 6 %
MDC IDC STAT BRADY AS VP PERCENT: 1 %

## 2016-08-07 DIAGNOSIS — L97521 Non-pressure chronic ulcer of other part of left foot limited to breakdown of skin: Secondary | ICD-10-CM | POA: Diagnosis not present

## 2016-08-12 DIAGNOSIS — I509 Heart failure, unspecified: Secondary | ICD-10-CM | POA: Diagnosis not present

## 2016-08-13 ENCOUNTER — Other Ambulatory Visit: Payer: Self-pay | Admitting: Family Medicine

## 2016-08-18 DIAGNOSIS — H16121 Filamentary keratitis, right eye: Secondary | ICD-10-CM | POA: Diagnosis not present

## 2016-08-18 LAB — HM DIABETES EYE EXAM

## 2016-08-23 DIAGNOSIS — S0501XA Injury of conjunctiva and corneal abrasion without foreign body, right eye, initial encounter: Secondary | ICD-10-CM | POA: Diagnosis not present

## 2016-08-24 DIAGNOSIS — N2581 Secondary hyperparathyroidism of renal origin: Secondary | ICD-10-CM | POA: Diagnosis not present

## 2016-08-24 DIAGNOSIS — I10 Essential (primary) hypertension: Secondary | ICD-10-CM | POA: Diagnosis not present

## 2016-08-24 DIAGNOSIS — N184 Chronic kidney disease, stage 4 (severe): Secondary | ICD-10-CM | POA: Diagnosis not present

## 2016-08-24 DIAGNOSIS — R600 Localized edema: Secondary | ICD-10-CM | POA: Diagnosis not present

## 2016-08-24 DIAGNOSIS — D631 Anemia in chronic kidney disease: Secondary | ICD-10-CM | POA: Diagnosis not present

## 2016-08-24 DIAGNOSIS — E1122 Type 2 diabetes mellitus with diabetic chronic kidney disease: Secondary | ICD-10-CM | POA: Diagnosis not present

## 2016-08-25 DIAGNOSIS — N184 Chronic kidney disease, stage 4 (severe): Secondary | ICD-10-CM | POA: Diagnosis not present

## 2016-08-31 ENCOUNTER — Ambulatory Visit: Payer: Medicare Other | Admitting: Family Medicine

## 2016-08-31 ENCOUNTER — Telehealth: Payer: Self-pay | Admitting: Cardiovascular Disease

## 2016-08-31 NOTE — Telephone Encounter (Signed)
Received cardiac clearance request for pt to proceed w/ eye surgery on 10/04/16.  Pt will be undergoing cataract w/ IOL w/ IVA block w/ Dr. Sandra Cockayne.  Please route clearance to [phone].

## 2016-09-04 ENCOUNTER — Other Ambulatory Visit: Payer: Self-pay | Admitting: Cardiovascular Disease

## 2016-09-04 DIAGNOSIS — L97521 Non-pressure chronic ulcer of other part of left foot limited to breakdown of skin: Secondary | ICD-10-CM | POA: Diagnosis not present

## 2016-09-04 NOTE — Telephone Encounter (Signed)
Acceptable risk for eye surgery, Would stop eliquis 2 days prior to surgery Would wait at least 24 hours if not 48 hours before restarting eliquis Would discuss timing of restarting blood thinner with by Doctor

## 2016-09-04 NOTE — Telephone Encounter (Signed)
Routed to fax # provided. 

## 2016-09-12 DIAGNOSIS — H2512 Age-related nuclear cataract, left eye: Secondary | ICD-10-CM | POA: Diagnosis not present

## 2016-09-12 DIAGNOSIS — I509 Heart failure, unspecified: Secondary | ICD-10-CM | POA: Diagnosis not present

## 2016-09-13 ENCOUNTER — Ambulatory Visit (INDEPENDENT_AMBULATORY_CARE_PROVIDER_SITE_OTHER): Payer: Self-pay | Admitting: Vascular Surgery

## 2016-09-13 ENCOUNTER — Encounter (INDEPENDENT_AMBULATORY_CARE_PROVIDER_SITE_OTHER): Payer: Medicare Other

## 2016-09-18 ENCOUNTER — Encounter: Payer: Self-pay | Admitting: *Deleted

## 2016-09-26 DIAGNOSIS — M9901 Segmental and somatic dysfunction of cervical region: Secondary | ICD-10-CM | POA: Diagnosis not present

## 2016-09-26 DIAGNOSIS — M9903 Segmental and somatic dysfunction of lumbar region: Secondary | ICD-10-CM | POA: Diagnosis not present

## 2016-09-26 DIAGNOSIS — M5033 Other cervical disc degeneration, cervicothoracic region: Secondary | ICD-10-CM | POA: Diagnosis not present

## 2016-09-26 DIAGNOSIS — M5136 Other intervertebral disc degeneration, lumbar region: Secondary | ICD-10-CM | POA: Diagnosis not present

## 2016-10-01 ENCOUNTER — Other Ambulatory Visit: Payer: Self-pay | Admitting: Cardiovascular Disease

## 2016-10-02 ENCOUNTER — Encounter: Payer: Self-pay | Admitting: Urology

## 2016-10-02 ENCOUNTER — Ambulatory Visit: Payer: Medicare Other | Admitting: Urology

## 2016-10-02 VITALS — BP 110/72 | HR 83 | Ht 72.0 in | Wt 248.0 lb

## 2016-10-02 DIAGNOSIS — R310 Gross hematuria: Secondary | ICD-10-CM | POA: Diagnosis not present

## 2016-10-02 NOTE — Progress Notes (Signed)
10/02/2016 11:22 AM   Marco Lutes Sr. 26-Feb-1936 [id number]  Referring provider: [name], MD [address] [place], [place] [address]  Chief Complaint  Patient presents with  . Hematuria    New Patient    HPI: 81 year old male with a host of medical problems who presents today for further evaluation of gross hematuria. The patient has had ongoing gross hematuria for several years now, and has been completely evaluated for this by Dr. [name]. His last follow-up visit was in May 2016. At that time, he had undergone a CT scan noncontrast of his abdomen and pelvis as well as cystoscopy. The patient was noted to have bilateral nonobstructing stones but his workup was otherwise negative, and the etiology of his hematuria was assumed to be from the nonobstructing stones.  The patient states that he has had several more episodes of gross hematuria, most notably in the fall of 2017 the patient was brought to the emergency room in [place] by EMS for gross hematuria. The workup was largely unremarkable, he did not undergo cystoscopy at that time. He has had intermittent hematuria since then. Currently he is not bleeding.    In addition, the patient has a history of urinary retention and obstructive voiding symptoms. He is taking finasteride and Flomax. Symptoms of been stable. He denies any burning or dysuria. Denies any fevers or chills. His head and the flank pain.  The patient is scheduled for cataract surgery this Wednesday. He continues to take antiplatelet therapy for his vascular disease. No needed PMH: Past Medical History:  Diagnosis Date  . AAA (abdominal aortic aneurysm)repaired    a. 12/2006 s/p repair of 6cm infrarenal AAA with 98mm Dacron graft.  . Anemia   . Anginal pain ([place])   . Atrial fibrillation/Flutter    a. Dx 2014;  b. 10/2012 s/p TEE/DCCV;  c. On amio and Eliquis (CHA2DS2VASc = 6).  . Basal cell carcinoma    a. 2013 x 2  back and left neck  . BPH (benign prostatic hypertrophy)   . Carotid artery occlusion    a. 11/2008 s/p R CEA.  . Chronic systolic heart failure ([place])    a. 02/2012 Echo: EF 30-35%, Pulm HTN, and modest RV dysfunction;  b. 10/2012 TEE: EF 25-30%, mild conc LVH, diff HK, mod RV dysfxn, mod dil RA/LA, mild MR, no MS, nl MVR, mod TR, Ao sclerosis.  . CKD (chronic kidney disease), stage IV ([place])   . Coronary artery disease    a. 1999 PCI RCA;  b. 01/2005 NSTEMI: CABG x 3 (LIMA->LAD, VG->OM, VG->LPL) @ time of MVR.  [name] DVT (deep venous thrombosis) ([place])   . Dysrhythmia   . GERD (gastroesophageal reflux disease)   . History of hiatal hernia   . History of shingles   . Hyperlipidemia   . Hypertension   . Hypertensive heart disease   . Mitral regurgitation--s/p repair    a. 01/2005 MVR: 28-mm Edwards ET Logix ring annuloplasty; b. 10/2012 TEE: normal fxning MV ring with mild MR, no MS.  . Mixed Ischemic and Non-ischemic Cardiomyopathy    a. 02/2012 Echo: EF 30-35%, Pulm HTN, and modest RV dysfunction;  b. 10/2012 TEE: EF 25-30%, mild conc LVH, diff HK, mod RV dysfxn, mod dil RA/LA, mild MR, no MS, nl MVR, mod TR, Ao sclerosis.  . Myocardial infarction    2006  . Obesity   . Orthopnea   . Oxygen deficit    HS  .  Presence of permanent cardiac pacemaker   . PVC (premature ventricular contraction)    a. 02/2009 s/p RFCA (J Allred).  . Sick sinus syndrome ([place])    a. 01/2005 s/p PPM 2/2 heart block following CABG;  b. 05/2012 Gen Change: MDT Adapta ADDRL1 DC PPM, ser # [id number] H.  . Sleep apnea    a. Intolerant of CPAP  . Type II diabetes mellitus ([place])     Surgical History: Past Surgical History:  Procedure Laterality Date  . ABDOMINAL AORTIC ANEURYSM REPAIR      May 2008,   . CARDIAC CATHETERIZATION  2006   @ [facility]  . CATARACT EXTRACTION     right  . CORONARY ARTERY BYPASS GRAFT     CABG with a  LIMA to the LAD, SVG to circumflex, SVG to posterolateral June2006.  Randolm Idol / REPLACE / REMOVE  PACEMAKER  2006   Dr. [name]  . MITRAL VALVE REPAIR  2006    mitral valve repair with a #28 Edwards logic ring  . PACEMAKER PLACEMENT    . PERMANENT PACEMAKER GENERATOR CHANGE N/A 06/26/2012   Procedure: PERMANENT PACEMAKER GENERATOR CHANGE;  Surgeon: Deboraha Sprang, MD;  [facility] C[facility] LAB;  Service: Cardiovascular;  Laterality: N/A;  . SKIN BIOPSY     left wrist; right elbow  . SKIN CANCER EXCISION      x 6  . SKIN CANCER EXCISION    . TRANSESOPHAGEAL ECHOCARDIOGRAM    . VASECTOMY      Home Medications:  Allergies as of 10/02/2016      Reactions   Shellfish Allergy Anaphylaxis      Medication List       Accurate as of 10/02/16 11:22 AM. Always use your most recent med list.          ACCU-CHEK FASTCLIX LANCETS Misc TEST twice a day if needed   ACCU-CHEK SMARTVIEW test strip Generic drug:  glucose blood   ACCU-CHEK SMARTVIEW test strip Generic drug:  glucose blood TEST twice a day   amiodarone 200 MG tablet Commonly known as:  PACERONE take 1 tablet by mouth once daily   BD INSULIN SYRINGE ULTRAFINE 31G X 5/16" 0.3 ML Misc Generic drug:  Insulin Syringe-Needle U-100   BD INSULIN SYRINGE ULTRAFINE 31G X 5/16" 0.3 ML Misc Generic drug:  Insulin Syringe-Needle U-100 use twice a day as directed   calcitRIOL 0.5 MCG capsule Commonly known as:  ROCALTROL take 1 capsule by mouth once daily   carvedilol 3.125 MG tablet Commonly known as:  COREG Take 1 tablet (3.125 mg total) by mouth 2 (two) times daily with a meal.   cholecalciferol 1000 units tablet Commonly known as:  VITAMIN D Take 1,000 Units by mouth daily.   docusate sodium 100 MG capsule Commonly known as:  COLACE Take 100 mg by mouth 2 (two) times daily.   ELIQUIS 5 MG Tabs tablet Generic drug:  apixaban take 1 tablet by mouth twice a day   finasteride 5 MG tablet Commonly known as:  PROSCAR Take 5 mg by mouth daily.   furosemide 40 MG tablet Commonly known as:  LASIX Take 1 tablet (40 mg  total) by mouth every other day.   HUMALOG MIX 75/25 (75-25) 100 UNIT/ML Susp injection Generic drug:  insulin lispro protamine-lispro inject 5 to 10 units subcutaneously twice a day   losartan 25 MG tablet Commonly known as:  COZAAR Take 25 mg by mouth daily.   MULTI-VITAMIN GUMMIES Chew Chew by mouth daily.  NASACORT ALLERGY 24HR 55 MCG/ACT Aero nasal inhaler Generic drug:  triamcinolone Place 2 sprays into the nose daily.   omeprazole 20 MG capsule Commonly known as:  PRILOSEC Take 20 mg by mouth daily. Monday, Wednesday, friday   polyethylene glycol packet Commonly known as:  MIRALAX / GLYCOLAX Take 17 g by mouth daily as needed for mild constipation.   rosuvastatin 20 MG tablet Commonly known as:  CRESTOR take 1 tablet by mouth at bedtime   tamsulosin 0.4 MG Caps capsule Commonly known as:  FLOMAX Take 0.4 mg by mouth daily.       Allergies:  Allergies  Allergen Reactions  . Shellfish Allergy Anaphylaxis    Family History: Family History  Problem Relation Age of Onset  . Heart attack Mother 12  . Diabetes Sister   . Hypertension Sister   . Heart attack Sister   . Heart attack Brother 54    3 Brothers deceased from [place] in 76's or 41's  . Heart disease Brother     Heart Disease before age 79  . Heart attack Father 75  . Diabetes Father     Amputation: bilateral feet  . Heart disease Father   . Heart attack Sister   . Heart attack Sister   . Heart attack Brother 81  . Heart attack Brother 85  . Diabetes Son     Social History:  reports that he quit smoking about 29 years ago. His smoking use included Cigarettes. He has a 51.00 pack-year smoking history. He has never used smokeless tobacco. He reports that he drinks about 0.6 oz of alcohol per week . He reports that he does not use drugs.  ROS: UROLOGY Frequent Urination?: No Hard to postpone urination?: No Burning/pain with urination?: No Get up at night to urinate?: Yes Leakage of urine?:  No Urine stream starts and stops?: No Trouble starting stream?: Yes Do you have to strain to urinate?: Yes Blood in urine?: Yes Urinary tract infection?: No Sexually transmitted disease?: No Injury to kidneys or bladder?: No Painful intercourse?: No Weak stream?: Yes Erection problems?: Yes Penile pain?: No  Gastrointestinal Nausea?: No Vomiting?: No Indigestion/heartburn?: No Diarrhea?: No Constipation?: No  Constitutional Fever: No Night sweats?: No Weight loss?: No Fatigue?: No  Skin Skin rash/lesions?: No Itching?: No  Eyes Blurred vision?: Yes Double vision?: No  Ears/Nose/Throat Sore throat?: No Sinus problems?: Yes  Hematologic/Lymphatic Swollen glands?: No Easy bruising?: Yes  Cardiovascular Leg swelling?: Yes Chest pain?: No  Respiratory Cough?: No Shortness of breath?: Yes  Endocrine Excessive thirst?: Yes  Musculoskeletal Back pain?: No Joint pain?: No  Neurological Headaches?: No Dizziness?: No  Psychologic Depression?: No Anxiety?: No  Physical Exam: BP 110/72   Pulse 83   Ht 6' (1.829 m)   Wt 112.5 kg (248 lb)   BMI 33.63 kg/m   Constitutional:  Alert and oriented, No acute distress. HEENT: [place] AT, moist mucus membranes.  Trachea midline, no masses. Cardiovascular: No clubbing, cyanosis, or edema. Respiratory: Normal respiratory effort, no increased work of breathing. GI: Abdomen is soft, nontender, nondistended, no abdominal masses Skin: No rashes, bruises or suspicious lesions. Lymph: No cervical or inguinal adenopathy. Neurologic: Grossly intact, no focal deficits, moving all 4 extremities. Psychiatric: Normal mood and affect.  Laboratory Data: Lab Results  Component Value Date   WBC 7.9 06/26/2016   HGB 11.6 (L) 06/12/2016   HCT 35.1 (L) 06/26/2016   MCV 92 06/26/2016   PLT 182 06/26/2016    Lab Results  Component Value Date   CREATININE 2.82 (H) 06/26/2016    Lab Results  Component Value Date   PSA  0.5 10/01/2014    No results found for: TESTOSTERONE  Lab Results  Component Value Date   HGBA1C 6.9 (H) 06/02/2016    Urinalysis    Component Value Date/Time   COLORURINE YELLOW (A) 06/10/2016 2105   APPEARANCEUR CLEAR (A) 06/10/2016 2105   APPEARANCEUR Clear 03/13/2014 1317   LABSPEC 1.010 06/10/2016 2105   LABSPEC 1.014 03/13/2014 1317   PHURINE 6.0 06/10/2016 2105   GLUCOSEU 150 (A) 06/10/2016 2105   GLUCOSEU 50 mg/dL 03/13/2014 1317   HGBUR NEGATIVE 06/10/2016 2105   BILIRUBINUR NEGATIVE 06/10/2016 2105   BILIRUBINUR Negative 03/13/2014 1317   [place] 06/10/2016 2105   PROTEINUR NEGATIVE 06/10/2016 2105   UROBILINOGEN 1.0 12/11/2008 1041   NITRITE NEGATIVE 06/10/2016 2105   LEUKOCYTESUR NEGATIVE 06/10/2016 2105   LEUKOCYTESUR Negative 03/13/2014 1317    Pertinent Imaging: I have reviewed the patient's CT scan from October 2017 which demonstrates bilateral nonobstructing stones, atrophic kidneys, but otherwise unremarkable. It is unchanged from his prior CT scans.  Assessment & Plan:  The patient has gross hematuria, this is chronic and no etiology has been established aside from the nonobstructing stones in the setting of antiplatelet medication. His last cystoscopic evaluation was more than 18 months ago.  1. Gross hematuria At this point, our plan is to obtain cystoscopy to evaluate the patient's bladder. Beyond that, I don't think the patient needs repeat upper tract imaging. We will plan to get cystoscopy scheduled in the near future. - Urinalysis, Complete   No Follow-up on file.  Ardis Hughs, [facility] [address] [place], [place] [address] [phone]

## 2016-10-03 NOTE — H&P (Signed)
See scanned note.

## 2016-10-04 ENCOUNTER — Ambulatory Visit: Payer: Medicare Other | Admitting: Certified Registered Nurse Anesthetist

## 2016-10-04 ENCOUNTER — Ambulatory Visit
Admission: RE | Admit: 2016-10-04 | Discharge: 2016-10-04 | Disposition: A | Discharge status: Home / Self Care | Payer: Medicare Other | Source: Ambulatory Visit | Attending: Ophthalmology | Admitting: Ophthalmology

## 2016-10-04 ENCOUNTER — Encounter: Payer: Self-pay | Admitting: *Deleted

## 2016-10-04 ENCOUNTER — Encounter
Admission: RE | Disposition: A | Discharge status: Home / Self Care | Payer: Self-pay | Source: Ambulatory Visit | Attending: Ophthalmology

## 2016-10-04 DIAGNOSIS — E1136 Type 2 diabetes mellitus with diabetic cataract: Secondary | ICD-10-CM | POA: Diagnosis not present

## 2016-10-04 DIAGNOSIS — I252 Old myocardial infarction: Secondary | ICD-10-CM | POA: Insufficient documentation

## 2016-10-04 DIAGNOSIS — I509 Heart failure, unspecified: Secondary | ICD-10-CM | POA: Insufficient documentation

## 2016-10-04 DIAGNOSIS — Z79899 Other long term (current) drug therapy: Secondary | ICD-10-CM | POA: Insufficient documentation

## 2016-10-04 DIAGNOSIS — K219 Gastro-esophageal reflux disease without esophagitis: Secondary | ICD-10-CM | POA: Diagnosis not present

## 2016-10-04 DIAGNOSIS — I2510 Atherosclerotic heart disease of native coronary artery without angina pectoris: Secondary | ICD-10-CM | POA: Diagnosis not present

## 2016-10-04 DIAGNOSIS — I739 Peripheral vascular disease, unspecified: Secondary | ICD-10-CM | POA: Insufficient documentation

## 2016-10-04 DIAGNOSIS — K449 Diaphragmatic hernia without obstruction or gangrene: Secondary | ICD-10-CM | POA: Insufficient documentation

## 2016-10-04 DIAGNOSIS — H2512 Age-related nuclear cataract, left eye: Secondary | ICD-10-CM | POA: Diagnosis not present

## 2016-10-04 DIAGNOSIS — G4730 Sleep apnea, unspecified: Secondary | ICD-10-CM | POA: Insufficient documentation

## 2016-10-04 DIAGNOSIS — Z87891 Personal history of nicotine dependence: Secondary | ICD-10-CM | POA: Insufficient documentation

## 2016-10-04 DIAGNOSIS — I110 Hypertensive heart disease with heart failure: Secondary | ICD-10-CM | POA: Diagnosis not present

## 2016-10-04 HISTORY — PX: CATARACT EXTRACTION W/PHACO: SHX586

## 2016-10-04 HISTORY — DX: Presence of cardiac pacemaker: Z95.0

## 2016-10-04 HISTORY — DX: Personal history of other diseases of the digestive system: Z87.19

## 2016-10-04 HISTORY — DX: Acute myocardial infarction, unspecified: I21.9

## 2016-10-04 HISTORY — DX: Angina pectoris, unspecified: I20.9

## 2016-10-04 HISTORY — DX: Hypoxemia: R09.02

## 2016-10-04 HISTORY — DX: Orthopnea: R06.01

## 2016-10-04 LAB — GLUCOSE, CAPILLARY: GLUCOSE-CAPILLARY: 122 mg/dL — AB (ref 65–99)

## 2016-10-04 SURGERY — PHACOEMULSIFICATION, CATARACT, WITH IOL INSERTION
Anesthesia: Monitor Anesthesia Care | Site: Eye | Laterality: Left | Wound class: Clean

## 2016-10-04 MED ORDER — TETRACAINE HCL 0.5 % OP SOLN
OPHTHALMIC | Status: AC
  Filled 2016-10-04: qty 2

## 2016-10-04 MED ORDER — LIDOCAINE HCL (PF) 4 % IJ SOLN
INTRAMUSCULAR | Status: AC
  Filled 2016-10-04: qty 5

## 2016-10-04 MED ORDER — CEFUROXIME OPHTHALMIC INJECTION 1 MG/0.1 ML
INJECTION | OPHTHALMIC | Status: DC | PRN
  Administered 2016-10-04: .1 mL via INTRACAMERAL

## 2016-10-04 MED ORDER — MIDAZOLAM HCL 2 MG/2ML IJ SOLN
INTRAMUSCULAR | Status: AC
  Filled 2016-10-04: qty 2

## 2016-10-04 MED ORDER — NA CHONDROIT SULF-NA HYALURON 40-17 MG/ML IO SOLN
INTRAOCULAR | Status: AC
  Filled 2016-10-04: qty 1

## 2016-10-04 MED ORDER — BUPIVACAINE HCL (PF) 0.75 % IJ SOLN
INTRAMUSCULAR | Status: AC
  Filled 2016-10-04: qty 10

## 2016-10-04 MED ORDER — NA CHONDROIT SULF-NA HYALURON 40-17 MG/ML IO SOLN
INTRAOCULAR | Status: DC | PRN
  Administered 2016-10-04: 1 mL via INTRAOCULAR

## 2016-10-04 MED ORDER — EPINEPHRINE PF 1 MG/ML IJ SOLN
INTRAOCULAR | Status: DC | PRN
  Administered 2016-10-04: 1 mL via OPHTHALMIC

## 2016-10-04 MED ORDER — MOXIFLOXACIN HCL 0.5 % OP SOLN
OPHTHALMIC | Status: AC
  Administered 2016-10-04: 1 [drp] via OPHTHALMIC
  Filled 2016-10-04: qty 3

## 2016-10-04 MED ORDER — MOXIFLOXACIN HCL 0.5 % OP SOLN
1.0000 [drp] | OPHTHALMIC | Status: AC
  Administered 2016-10-04 (×2): 1 [drp] via OPHTHALMIC

## 2016-10-04 MED ORDER — ALFENTANIL 500 MCG/ML IJ INJ
INJECTION | INTRAMUSCULAR | Status: DC | PRN
  Administered 2016-10-04: 250 ug via INTRAVENOUS

## 2016-10-04 MED ORDER — MOXIFLOXACIN HCL 0.5 % OP SOLN
OPHTHALMIC | Status: DC | PRN
  Administered 2016-10-04: 1 [drp] via OPHTHALMIC

## 2016-10-04 MED ORDER — HYALURONIDASE HUMAN 150 UNIT/ML IJ SOLN
INTRAMUSCULAR | Status: AC
  Filled 2016-10-04: qty 1

## 2016-10-04 MED ORDER — CEFUROXIME OPHTHALMIC INJECTION 1 MG/0.1 ML
INJECTION | OPHTHALMIC | Status: AC
  Filled 2016-10-04: qty 0.1

## 2016-10-04 MED ORDER — LIDOCAINE HCL (PF) 4 % IJ SOLN
INTRAMUSCULAR | Status: DC | PRN
  Administered 2016-10-04: 4 mL via OPHTHALMIC

## 2016-10-04 MED ORDER — CYCLOPENTOLATE HCL 2 % OP SOLN
OPHTHALMIC | Status: AC
  Administered 2016-10-04: 1 [drp] via OPHTHALMIC
  Filled 2016-10-04: qty 2

## 2016-10-04 MED ORDER — LIDOCAINE HCL (PF) 4 % IJ SOLN
INTRAMUSCULAR | Status: DC | PRN
  Administered 2016-10-04: 2.25 mL via OPHTHALMIC

## 2016-10-04 MED ORDER — CARBACHOL 0.01 % IO SOLN
INTRAOCULAR | Status: DC | PRN
  Administered 2016-10-04: .5 mL via INTRAOCULAR

## 2016-10-04 MED ORDER — POVIDONE-IODINE 5 % OP SOLN
OPHTHALMIC | Status: DC | PRN
  Administered 2016-10-04: 1 via OPHTHALMIC

## 2016-10-04 MED ORDER — PHENYLEPHRINE HCL 10 % OP SOLN
1.0000 [drp] | OPHTHALMIC | Status: AC
  Administered 2016-10-04 (×2): 1 [drp] via OPHTHALMIC

## 2016-10-04 MED ORDER — TETRACAINE HCL 0.5 % OP SOLN
OPHTHALMIC | Status: DC | PRN
  Administered 2016-10-04: 1 [drp] via OPHTHALMIC

## 2016-10-04 MED ORDER — CYCLOPENTOLATE HCL 2 % OP SOLN
1.0000 [drp] | OPHTHALMIC | Status: AC
  Administered 2016-10-04 (×2): 1 [drp] via OPHTHALMIC

## 2016-10-04 MED ORDER — EPINEPHRINE PF 1 MG/ML IJ SOLN
INTRAMUSCULAR | Status: AC
  Filled 2016-10-04: qty 1

## 2016-10-04 MED ORDER — SODIUM CHLORIDE 0.9 % IV SOLN
INTRAVENOUS | Status: DC
  Administered 2016-10-04: 07:00:00 via INTRAVENOUS

## 2016-10-04 MED ORDER — POVIDONE-IODINE 5 % OP SOLN
OPHTHALMIC | Status: AC
  Filled 2016-10-04: qty 30

## 2016-10-04 MED ORDER — PHENYLEPHRINE HCL 10 % OP SOLN
OPHTHALMIC | Status: AC
  Administered 2016-10-04: 1 [drp] via OPHTHALMIC
  Filled 2016-10-04: qty 5

## 2016-10-04 SURGICAL SUPPLY — 29 items

## 2016-10-04 NOTE — Op Note (Signed)
Date of Surgery: 10/04/2016 Date of Dictation: 10/04/2016 8:18 AM Pre-operative Diagnosis:  Nuclear Sclerotic Cataract left Eye Post-operative Diagnosis: same Procedure performed: Extra-capsular Cataract Extraction (ECCE) with placement of a posterior chamber intraocular lens (IOL) left Eye IOL:  Implant Name Type Inv. Item Serial No. Manufacturer Lot No. LRB No. Used  LENS IOL ACRYSOF IQ 20.5 - [id number] Intraocular Lens LENS IOL ACRYSOF IQ 20.5 [id number] ALCON   Left 1   Anesthesia: 2% Lidocaine and 4% Marcaine in a 50/50 mixture with 10 unites/ml of Hylenex given as a peribulbar Anesthesiologist: Anesthesiologist: Molli Barrows, MD CRNA: Darlyne Russian, CRNA Complications: none Estimated Blood Loss: less than 1 ml  Description of procedure:  The patient was given anesthesia and sedation via intravenous access. The patient was then prepped and draped in the usual fashion. A 25-gauge needle was bent for initiating the capsulorhexis. A 5-0 silk suture was placed through the conjunctiva superior and inferiorly to serve as bridle sutures. Hemostasis was obtained at the superior limbus using an eraser cautery. A partial thickness groove was made at the anterior surgical limbus with a 64 Beaver blade and this was dissected anteriorly with an Avaya. The anterior chamber was entered at 10 o'clock with a 1.0 mm paracentesis knife and through the lamellar dissection with a 2.6 mm Alcon keratome. Epi-Shugarcaine 0.5 CC [9 cc BSS Plus (Alcon), 3 cc 4% preservative-free lidocaine (Hospira) and 4 cc 1:1000 preservative-free, bisulfite-free epinephrine] was injected into the anterior chamber via the paracentesis tract. Epi-Shugarcaine 0.5 CC [9 cc BSS Plus (Alcon), 3 cc 4% preservative-free lidocaine (Hospira) and 4 cc 1:1000 preservative-free, bisulfite-free epinephrine] was injected into the anterior chamber via the paracentesis tract. DiscoVisc was injected to replace the aqueous and a  continuous tear curvilinear capsulorhexis was performed using a bent 25-gauge needle.  Balance salt on a syringe was used to perform hydro-dissection and phacoemulsification was carried out using a divide and conquer technique. Procedure(s) with comments: CATARACT EXTRACTION PHACO AND INTRAOCULAR LENS PLACEMENT (I[facility]) (Left) - [place] 01:21 AP% 25.9 CDE 39.08 Fluid pack lot # [id number] . Irrigation/aspiration was used to remove the residual cortex and the capsular bag was inflated with DiscoVisc. The intraocular lens was inserted into the capsular bag using a pre-loaded UltraSert Delivery System. Irrigation/aspiration was used to remove the residual DiscoVisc. The wound was inflated with balanced salt and checked for leaks. None were found. Miostat was injected via the paracentesis track and 0.1 ml of cefuroxime containing 1 mg of drug  was injected via the paracentesis track. The wound was checked for leaks again and none were found.   The bridal sutures were removed and two drops of Vigamox were placed on the eye. An eye shield was placed to protect the eye and the patient was discharged to the recovery area in good condition.   Marco Luscher MD

## 2016-10-04 NOTE — Anesthesia Preprocedure Evaluation (Signed)
Anesthesia Evaluation  Patient identified by MRN, date of birth, ID band Patient awake    Reviewed: Allergy & Precautions, H&P , NPO status , Patient's Chart, lab work & pertinent test results, reviewed documented beta blocker date and time   Airway Mallampati: II   Neck ROM: full    Dental  (+) Poor Dentition   Pulmonary neg pulmonary ROS, sleep apnea , former smoker,    Pulmonary exam normal        Cardiovascular hypertension, + angina at rest + CAD, + Past MI, + Peripheral Vascular Disease, +CHF and + Orthopnea  negative cardio ROS Normal cardiovascular exam+ dysrhythmias + pacemaker  Rhythm:regular Rate:Normal     Neuro/Psych negative neurological ROS  negative psych ROS   GI/Hepatic negative GI ROS, Neg liver ROS, hiatal hernia, GERD  Medicated,  Endo/Other  negative endocrine ROSdiabetes  Renal/GU Renal diseasenegative Renal ROS  negative genitourinary   Musculoskeletal   Abdominal   Peds  Hematology negative hematology ROS (+) anemia ,   Anesthesia Other Findings Past Medical History: No date: AAA (abdominal aortic aneurysm)repaired     Comment: a. 12/2006 s/p repair of 6cm infrarenal AAA               with 76mm Dacron graft. No date: Anemia No date: Anginal pain ([place]) No date: Atrial fibrillation/Flutter     Comment: a. Dx 2014;  b. 10/2012 s/p TEE/DCCV;  c. On               amio and Eliquis (CHA2DS2VASc = 6). No date: Basal cell carcinoma     Comment: a. 2013 x 2 back and left neck No date: BPH (benign prostatic hypertrophy) No date: Carotid artery occlusion     Comment: a. 11/2008 s/p R CEA. No date: Chronic systolic heart failure ([place])     Comment: a. 02/2012 Echo: EF 30-35%, Pulm HTN, and               modest RV dysfunction;  b. 10/2012 TEE: EF               25-30%, mild conc LVH, diff HK, mod RV dysfxn,               mod dil RA/LA, mild MR, no MS, nl MVR, mod TR,               Ao sclerosis. No  date: CKD (chronic kidney disease), stage IV (HCC) No date: Coronary artery disease     Comment: a. 1999 PCI RCA;  b. 01/2005 NSTEMI: CABG x 3               (LIMA->LAD, VG->OM, VG->LPL) @ time of MVR. No date: DVT (deep venous thrombosis) (HCC) No date: Dysrhythmia No date: GERD (gastroesophageal reflux disease) No date: History of hiatal hernia No date: History of shingles No date: Hyperlipidemia No date: Hypertension No date: Hypertensive heart disease No date: Mitral regurgitation--s/p repair     Comment: a. 01/2005 MVR: 28-mm Edwards ET Logix ring               annuloplasty; b. 10/2012 TEE: normal fxning MV               ring with mild MR, no MS. No date: Mixed Ischemic and Non-ischemic Cardiomyopathy     Comment: a. 02/2012 Echo: EF 30-35%, Pulm HTN, and               modest RV dysfunction;  b. 10/2012  TEE: EF               25-30%, mild conc LVH, diff HK, mod RV dysfxn,               mod dil RA/LA, mild MR, no MS, nl MVR, mod TR,               Ao sclerosis. No date: Myocardial infarction     Comment: 2006 No date: Obesity No date: Orthopnea No date: Oxygen deficit     Comment: HS No date: Presence of permanent cardiac pacemaker No date: PVC (premature ventricular contraction)     Comment: a. 02/2009 s/p RFCA (J Allred). No date: Sick sinus syndrome ([place])     Comment: a. 01/2005 s/p PPM 2/2 heart block following               CABG;  b. 05/2012 Gen Change: MDT Adapta ADDRL1              DC PPM, ser # [id number] H. No date: Sleep apnea     Comment: a. Intolerant of CPAP No date: Type II diabetes mellitus ([place]) Past Surgical History: No date: ABDOMINAL AORTIC ANEURYSM REPAIR     Comment:  May 2008,  2006: CARDIAC CATHETERIZATION     Comment: @ [place] No date: CATARACT EXTRACTION     Comment: right No date: CORONARY ARTERY BYPASS GRAFT     Comment: CABG with a  LIMA to the LAD, SVG to               circumflex, SVG to posterolateral June2006. 2006: INSERT / REPLACE / REMOVE  PACEMAKER     Comment: Dr. [name] 2006: MITRAL VALVE REPAIR     Comment:  mitral valve repair with a #28 Edwards logic               ring No date: PACEMAKER PLACEMENT 06/26/2012: PERMANENT PACEMAKER GENERATOR CHANGE N/A     Comment: Procedure: PERMANENT PACEMAKER GENERATOR               CHANGE;  Surgeon: Deboraha Sprang, MD;                Location[facility] CATH LAB;  Service:               Cardiovascular;  Laterality: N/A; No date: SKIN BIOPSY     Comment: left wrist; right elbow No date: SKIN CANCER EXCISION     Comment:  x 6 No date: SKIN CANCER EXCISION No date: TRANSESOPHAGEAL ECHOCARDIOGRAM No date: VASECTOMY BMI    Body Mass Index:  33.63 kg/m     Reproductive/Obstetrics negative OB ROS                             Anesthesia Physical Anesthesia Plan  ASA: IV  Anesthesia Plan: General   Post-op Pain Management:    Induction:   Airway Management Planned:   Additional Equipment:   Intra-op Plan:   Post-operative Plan:   Informed Consent: I have reviewed the patients History and Physical, chart, labs and discussed the procedure including the risks, benefits and alternatives for the proposed anesthesia with the patient or authorized representative who has indicated his/her understanding and acceptance.   Dental Advisory Given  Plan Discussed with: CRNA  Anesthesia Plan Comments:         Anesthesia Quick Evaluation

## 2016-10-04 NOTE — Interval H&P Note (Signed)
History and Physical Interval Note:  10/04/2016 7:26 AM  Marco Lutes Sr.  has presented today for surgery, with the diagnosis of cataract  The various methods of treatment have been discussed with the patient and family. After consideration of risks, benefits and other options for treatment, the patient has consented to  Procedure(s): CATARACT EXTRACTION PHACO AND INTRAOCULAR LENS PLACEMENT ([place]) (Left) as a surgical intervention .  The patient's history has been reviewed, patient examined, no change in status, stable for surgery.  I have reviewed the patient's chart and labs.  Questions were answered to the patient's satisfaction.     Marco [name]

## 2016-10-04 NOTE — Anesthesia Post-op Follow-up Note (Cosign Needed)
Anesthesia QCDR form completed.        

## 2016-10-04 NOTE — Anesthesia Postprocedure Evaluation (Signed)
Anesthesia Post Note  Patient: Marco Kampf Vanhecke Sr.  Procedure(s) Performed: Procedure(s) (LRB): CATARACT EXTRACTION PHACO AND INTRAOCULAR LENS PLACEMENT ([place]) (Left)  Patient location during evaluation: PACU Anesthesia Type: MAC Level of consciousness: awake and awake and alert Pain management: pain level controlled Vital Signs Assessment: post-procedure vital signs reviewed and stable Respiratory status: spontaneous breathing and nonlabored ventilation Cardiovascular status: blood pressure returned to baseline and stable Postop Assessment: no headache, no signs of nausea or vomiting and no backache Anesthetic complications: no     Last Vitals:  Vitals:   10/04/16 0820 10/04/16 0823  BP: (!) 169/78 (!) 168/75  Pulse: 82 82  Resp: 16   Temp: (!) 35.4 C     Last Pain:  Vitals:   10/04/16 0823  TempSrc: Tympanic  PainSc:                  Darlyne Russian

## 2016-10-04 NOTE — Discharge Instructions (Addendum)
Eye Surgery Discharge Instructions  Expect mild scratchy sensation or mild soreness. DO NOT RUB YOUR EYE!  The day of surgery:  Minimal physical activity, but bed rest is not required  No reading, computer work, or close hand work  No bending, lifting, or straining.  May watch TV  For 24 hours:  No driving, legal decisions, or alcoholic beverages  Safety precautions  Eat anything you prefer: It is better to start with liquids, then soup then solid foods.  _____ Eye patch should be worn until postoperative exam tomorrow.  ____ Solar shield eyeglasses should be worn for comfort in the sunlight/patch while sleeping  Resume all regular medications including aspirin or Coumadin if these were discontinued prior to surgery. You may shower, bathe, shave, or wash your hair. Tylenol may be taken for mild discomfort.  Call your doctor if you experience significant pain, nausea, or vomiting, fever > 101 or other signs of infection. [phone] or [phone] Specific instructions:  Follow-up Information    DINGELDEIN,STEVEN, MD Follow up.   Specialty:  Ophthalmology Why:  10/05/16 at 10:45 Contact information: [place] [address] [phone]          Eye Surgery Discharge Instructions  Expect mild scratchy sensation or mild soreness. DO NOT RUB YOUR EYE!  The day of surgery:  Minimal physical activity, but bed rest is not required  No reading, computer work, or close hand work  No bending, lifting, or straining.  May watch TV  For 24 hours:  No driving, legal decisions, or alcoholic beverages  Safety precautions  Eat anything you prefer: It is better to start with liquids, then soup then solid foods.  _____ Eye patch should be worn until postoperative exam tomorrow.  ____ Solar shield eyeglasses should be worn for comfort in the sunlight/patch while sleeping  Resume all regular medications including aspirin or Coumadin if these  were discontinued prior to surgery. You may shower, bathe, shave, or wash your hair. Tylenol may be taken for mild discomfort.  Call your doctor if you experience significant pain, nausea, or vomiting, fever > 101 or other signs of infection. [phone] or [phone] Specific instructions:  Follow-up Information    DINGELDEIN,STEVEN, MD Follow up.   Specialty:  Ophthalmology Why:  10/05/16 at 10:45 Contact information: [address]   [place] [address] [phone]         See handout.

## 2016-10-04 NOTE — Transfer of Care (Signed)
Immediate Anesthesia Transfer of Care Note  Patient: Marco Walls Duplantis Sr.  Procedure(s) Performed: Procedure(s) with comments: CATARACT EXTRACTION PHACO AND INTRAOCULAR LENS PLACEMENT (I[facility]) (Left) - [place] 01:21 AP% 25.9 CDE 39.08 Fluid pack lot # [id number]   Patient Location: PACU  Anesthesia Type:MAC  Level of Consciousness: awake, alert , oriented and patient cooperative  Airway & Oxygen Therapy: Patient Spontanous Breathing  Post-op Assessment: Report given to RN and Post -op Vital signs reviewed and stable  Post vital signs: Reviewed and stable  Last Vitals:  Vitals:   10/04/16 0635  BP: (!) 142/75  Pulse: 74  Resp: 16  Temp: 36.6 C    Last Pain:  Vitals:   10/04/16 0635  TempSrc: Oral  PainSc: 3          Complications: No apparent anesthesia complications

## 2016-10-10 ENCOUNTER — Ambulatory Visit (INDEPENDENT_AMBULATORY_CARE_PROVIDER_SITE_OTHER): Payer: Medicare Other | Admitting: Internal Medicine

## 2016-10-10 ENCOUNTER — Encounter: Payer: Self-pay | Admitting: Internal Medicine

## 2016-10-10 ENCOUNTER — Encounter: Payer: Self-pay | Admitting: *Deleted

## 2016-10-10 VITALS — BP 98/68 | HR 78 | Ht 72.0 in | Wt 256.5 lb

## 2016-10-10 DIAGNOSIS — Z79899 Other long term (current) drug therapy: Secondary | ICD-10-CM

## 2016-10-10 DIAGNOSIS — I255 Ischemic cardiomyopathy: Secondary | ICD-10-CM

## 2016-10-10 DIAGNOSIS — I480 Paroxysmal atrial fibrillation: Secondary | ICD-10-CM

## 2016-10-10 DIAGNOSIS — Z950 Presence of cardiac pacemaker: Secondary | ICD-10-CM

## 2016-10-10 DIAGNOSIS — I5022 Chronic systolic (congestive) heart failure: Secondary | ICD-10-CM

## 2016-10-10 DIAGNOSIS — I495 Sick sinus syndrome: Secondary | ICD-10-CM

## 2016-10-10 LAB — CUP PACEART INCLINIC DEVICE CHECK
Battery Impedance: 252 Ohm
Battery Remaining Longevity: 106 mo
Battery Voltage: 2.79 V
Brady Statistic AP VP Percent: 6 %
Brady Statistic AP VS Percent: 93 %
Brady Statistic AS VP Percent: 1 %
Implantable Lead Implant Date: 20060615
Implantable Lead Location: 753859
Implantable Lead Model: 5076
Implantable Pulse Generator Implant Date: 20131030
Lead Channel Impedance Value: 452 Ohm
Lead Channel Impedance Value: 453 Ohm
Lead Channel Setting Pacing Amplitude: 2 V
Lead Channel Setting Pacing Amplitude: 2.5 V
MDC IDC LEAD IMPLANT DT: 20060615
MDC IDC LEAD LOCATION: 753860
MDC IDC MSMT LEADCHNL RA PACING THRESHOLD AMPLITUDE: 0.5 V
MDC IDC MSMT LEADCHNL RA PACING THRESHOLD PULSEWIDTH: 0.4 ms
MDC IDC MSMT LEADCHNL RV PACING THRESHOLD AMPLITUDE: 1 V
MDC IDC MSMT LEADCHNL RV PACING THRESHOLD PULSEWIDTH: 0.4 ms
MDC IDC MSMT LEADCHNL RV SENSING INTR AMPL: 8 mV
MDC IDC SESS DTM: 20180213112151
MDC IDC SET LEADCHNL RV PACING PULSEWIDTH: 0.4 ms
MDC IDC SET LEADCHNL RV SENSING SENSITIVITY: 4 mV
MDC IDC STAT BRADY AS VS PERCENT: 0 %

## 2016-10-10 NOTE — Progress Notes (Signed)
Patient Care Team: [name], MD as PCP - General (Unknown Physician Specialty) Minus Breeding, MD (Cardiology) Minna Merritts, MD as Consulting Physician (Cardiology)   HPI  Marco Few Dallman Sr. is a 81 y.o. male Seen in follow-up for a pacemaker implanted for tachybradycardia syndrome. He has a history of atrial arrhythmias both fibrillation and flutter. He is treated with amiodarone and  apixoban   He has  ischemic cardiomyopathy with prior bypass surgery and PCI. He also has remote mitral valve repair.    DATE TEST    2/14    TEE   EF 25-30 %   9/17    TTE   EF 20-25 %           2/17 TSH normal 3/17 AST 46  creatinine 2.53  Creatinine per his nephrologist recently is stable Date TSH LFTs Crs  8/17  1.89 19    10/17      2.82      He remains  significantly short of breath. He uses oxygen at home but not while he walks. He uses a walker. Edema is stable. He uses support stockings. He is on ARB therapy       Past Medical History:  Diagnosis Date  . AAA (abdominal aortic aneurysm)repaired    a. 12/2006 s/p repair of 6cm infrarenal AAA with 46mm Dacron graft.  . Anemia   . Anginal pain ([place])   . Atrial fibrillation/Flutter    a. Dx 2014;  b. 10/2012 s/p TEE/DCCV;  c. On amio and Eliquis (CHA2DS2VASc = 6).  . Basal cell carcinoma    a. 2013 x 2 back and left neck  . BPH (benign prostatic hypertrophy)   . Carotid artery occlusion    a. 11/2008 s/p R CEA.  . Chronic systolic heart failure ([place])    a. 02/2012 Echo: EF 30-35%, Pulm HTN, and modest RV dysfunction;  b. 10/2012 TEE: EF 25-30%, mild conc LVH, diff HK, mod RV dysfxn, mod dil RA/LA, mild MR, no MS, nl MVR, mod TR, Ao sclerosis.  . CKD (chronic kidney disease), stage IV ([place])   . Coronary artery disease    a. 1999 PCI RCA;  b. 01/2005 NSTEMI: CABG x 3 (LIMA->LAD, VG->OM, VG->LPL) @ time of MVR.  [name] DVT (deep venous thrombosis) ([place])   . Dysrhythmia   . GERD (gastroesophageal reflux  disease)   . History of hiatal hernia   . History of shingles   . Hyperlipidemia   . Hypertension   . Hypertensive heart disease   . Mitral regurgitation--s/p repair    a. 01/2005 MVR: 28-mm Edwards ET Logix ring annuloplasty; b. 10/2012 TEE: normal fxning MV ring with mild MR, no MS.  . Mixed Ischemic and Non-ischemic Cardiomyopathy    a. 02/2012 Echo: EF 30-35%, Pulm HTN, and modest RV dysfunction;  b. 10/2012 TEE: EF 25-30%, mild conc LVH, diff HK, mod RV dysfxn, mod dil RA/LA, mild MR, no MS, nl MVR, mod TR, Ao sclerosis.  . Myocardial infarction    2006  . Obesity   . Orthopnea   . Oxygen deficit    HS  . Presence of permanent cardiac pacemaker   . PVC (premature ventricular contraction)    a. 02/2009 s/p RFCA (J Allred).  . Sick sinus syndrome ([place])    a. 01/2005 s/p PPM 2/2 heart block following CABG;  b. 05/2012 Gen Change: MDT Adapta ADDRL1 DC PPM, ser # [id number] H.  . Sleep apnea  a. Intolerant of CPAP  . Type II diabetes mellitus ([place])     Past Surgical History:  Procedure Laterality Date  . ABDOMINAL AORTIC ANEURYSM REPAIR      May 2008,   . CARDIAC CATHETERIZATION  2006   @ [facility]  . CATARACT EXTRACTION     right  . CATARACT EXTRACTION W/PHACO Left 10/04/2016   Procedure: CATARACT EXTRACTION PHACO AND INTRAOCULAR LENS PLACEMENT ([facility]);  Surgeon: Estill Cotta, MD;  Location: [facility] ORS;  Service: Ophthalmology;  Laterality: Left;  [place] 01:21 AP% 25.9 CDE 39.08 Fluid pack lot # [id number]   . CORONARY ARTERY BYPASS GRAFT     CABG with a  LIMA to the LAD, SVG to circumflex, SVG to posterolateral [id number].  Randolm Idol / REPLACE / REMOVE PACEMAKER  2006   Dr. [name]  . MITRAL VALVE REPAIR  2006    mitral valve repair with a #28 Edwards logic ring  . PACEMAKER PLACEMENT    . PERMANENT PACEMAKER GENERATOR CHANGE N/A 06/26/2012   Procedure: PERMANENT PACEMAKER GENERATOR CHANGE;  Surgeon: Deboraha Sprang, MD;  Location: [facility] CATH LAB;  Service: Cardiovascular;  Laterality: N/A;    . SKIN BIOPSY     left wrist; right elbow  . SKIN CANCER EXCISION      x 6  . SKIN CANCER EXCISION    . TRANSESOPHAGEAL ECHOCARDIOGRAM    . VASECTOMY      Current Outpatient Prescriptions  Medication Sig Dispense Refill  . ACCU-CHEK FASTCLIX LANCETS MISC TEST twice a day if needed 102 each 12  . ACCU-CHEK SMARTVIEW test strip   1  . ACCU-CHEK SMARTVIEW test strip TEST twice a day 300 each 12  . amiodarone (PACERONE) 200 MG tablet take 1 tablet by mouth once daily 30 tablet 3  . BD INSULIN SYRINGE ULTRAFINE 31G X 5/16" 0.3 ML MISC   1  . BD INSULIN SYRINGE ULTRAFINE 31G X 5/16" 0.3 ML MISC use twice a day as directed 100 each 12  . calcitRIOL (ROCALTROL) 0.5 MCG capsule take 1 capsule by mouth once daily 90 capsule 3  . carvedilol (COREG) 3.125 MG tablet Take 1 tablet (3.125 mg total) by mouth 2 (two) times daily with a meal. 180 tablet 3  . cholecalciferol (VITAMIN D) 1000 UNITS tablet Take 1,000 Units by mouth daily.    [name] docusate sodium (COLACE) 100 MG capsule Take 100 mg by mouth 2 (two) times daily.      [name] ELIQUIS 5 MG TABS tablet take 1 tablet by mouth twice a day 60 tablet 11  . finasteride (PROSCAR) 5 MG tablet Take 5 mg by mouth daily.    . furosemide (LASIX) 40 MG tablet Take 1 tablet (40 mg total) by mouth every other day. 60 tablet 12  . HUMALOG MIX 75/25 (75-25) 100 UNIT/ML SUSP injection inject 5 to 10 units subcutaneously twice a day 10 vial 5  . losartan (COZAAR) 25 MG tablet Take 25 mg by mouth daily.    . Multiple Vitamins-Minerals (MULTI-VITAMIN GUMMIES) CHEW Chew by mouth daily.    [name] omeprazole (PRILOSEC) 20 MG capsule Take 20 mg by mouth daily. Monday, Wednesday, friday    . polyethylene glycol (MIRALAX / GLYCOLAX) packet Take 17 g by mouth daily as needed for mild constipation. 14 each 0  . rosuvastatin (CRESTOR) 20 MG tablet take 1 tablet by mouth at bedtime 90 tablet 3  . tamsulosin (FLOMAX) 0.4 MG CAPS capsule Take 0.4 mg by mouth daily.    [name]  triamcinolone  (NASACORT ALLERGY 24HR) 55 MCG/ACT AERO nasal inhaler Place 2 sprays into the nose daily.     No current facility-administered medications for this visit.     Allergies  Allergen Reactions  . Shellfish Allergy Anaphylaxis    Review of Systems negative except from HPI and PMH  Physical Exam BP 98/68 (BP Location: Left Arm, Patient Position: Sitting, Cuff Size: Normal)   Pulse 78   Ht 6' (1.829 m)   Wt 256 lb 8 oz (116.3 kg)   BMI 34.79 kg/m  Well developed and morbidly obese Caucasian male in no acute distress sitting in wheelchair HENT normal E scleral and icterus clear Neck Supple  Clear to ausculation Device pocket well healed; without hematoma or erythema.  There is no tethering  regular rate and rhythm,   Soft with active bowel sounds No clubbing cyanosis 1+ Edema Alert and oriented, Skin Warm and Dry  ECG Apacing at 78 with intrinsic conduction Intervals 21/12/38 axis left -34    Assessment and  Plan  Atrial fibrillation/flutter  Chronotropic incompetence/sinus node dysfunction  Ischemic cardiomyopathy  Status post CABG  Congestive heart failure-chronic-systolic  Pacemaker-Medtronic The patient's device was interrogated.  The information was reviewed. No changes were made in the programming.     High risk medication surveillance   Renal failure class 3-4  Hypotension   Blood pressure is well-controlled in the low; I will defer management of this to his PCP.  Without symptoms of ischemia  On Anticoagulation;  No bleeding issues   As his age approaches 48 at which time with his creatinine is ELIQUIS dose with the appropriately down titrated  With his dyspnea,was  concerned about amiodarone lung toxicity; we had ordered  pulmonary function tests.They were never done    have suggested he try a humidifier for his oxygen   We will check amiodarone surveillance laboratories  With his edema, but his renal function will defer management of blood pressure  as well as diuretics to his nephrologist

## 2016-10-10 NOTE — Patient Instructions (Addendum)
Medication Instructions: - Your physician recommends that you continue on your current medications as directed. Please refer to the Current Medication list given to you today.  Labwork: - Your physician recommends that you have lab work today: TSH/ Liver  Procedures/Testing: - Your physician has recommended that you have a pulmonary function test (order was placed in August)  Pulmonary Function Tests are a group of tests that measure how well air moves in and out of your lungs.  Follow-Up: - Remote monitoring is used to monitor your Pacemaker of ICD from home. This monitoring reduces the number of office visits required to check your device to one time per year. It allows [place] to keep an eye on the functioning of your device to ensure it is working properly. You are scheduled for a device check from home on 01/09/17. You may send your transmission at any time that day. If you have a wireless device, the transmission will be sent automatically. After your physician reviews your transmission, you will receive a postcard with your next transmission date.  - Your physician wants you to follow-up in: 1 year with Dr. [name]. You will receive a reminder letter in the mail two months in advance. If you don't receive a letter, please call our office to schedule the follow-up appointment.  Any Additional Special Instructions Will Be Listed Below (If Applicable).  ** Call Medtr[employer] Patient Records at [phone] to order a new ID card **  If you need a refill on your cardiac medications before your next appointment, please call your pharmacy.

## 2016-10-11 LAB — HEPATIC FUNCTION PANEL
ALK PHOS: 54 IU/L (ref 39–117)
ALT: 13 IU/L (ref 0–44)
AST: 13 IU/L (ref 0–40)
Albumin: 3.6 g/dL (ref 3.5–4.8)
Bilirubin Total: 0.2 mg/dL (ref 0.0–1.2)
Bilirubin, Direct: 0.12 mg/dL (ref 0.00–0.40)
Total Protein: 6.9 g/dL (ref 6.0–8.5)

## 2016-10-11 LAB — TSH: TSH: 2.06 u[IU]/mL (ref 0.450–4.500)

## 2016-10-13 DIAGNOSIS — I509 Heart failure, unspecified: Secondary | ICD-10-CM | POA: Diagnosis not present

## 2016-10-17 ENCOUNTER — Telehealth: Payer: Self-pay | Admitting: Internal Medicine

## 2016-10-17 NOTE — Telephone Encounter (Signed)
I called and spoke with Mrs. Shoemaker. She is aware that the patient's PFT's are scheduled at [facility] on 11/02/16 at 9:30 am (the patient will need to arrive at 9:00 am). Letter of instructions mailed to the patient.

## 2016-10-18 ENCOUNTER — Other Ambulatory Visit (INDEPENDENT_AMBULATORY_CARE_PROVIDER_SITE_OTHER): Payer: Self-pay | Admitting: Vascular Surgery

## 2016-10-18 ENCOUNTER — Encounter: Payer: Self-pay | Admitting: Family Medicine

## 2016-10-18 ENCOUNTER — Ambulatory Visit (INDEPENDENT_AMBULATORY_CARE_PROVIDER_SITE_OTHER): Payer: Medicare Other | Admitting: Family Medicine

## 2016-10-18 VITALS — BP 128/70 | HR 82 | Temp 98.4°F | Resp 18 | Wt 258.0 lb

## 2016-10-18 DIAGNOSIS — I6523 Occlusion and stenosis of bilateral carotid arteries: Secondary | ICD-10-CM

## 2016-10-18 DIAGNOSIS — Z48812 Encounter for surgical aftercare following surgery on the circulatory system: Secondary | ICD-10-CM

## 2016-10-18 DIAGNOSIS — E1121 Type 2 diabetes mellitus with diabetic nephropathy: Secondary | ICD-10-CM

## 2016-10-18 LAB — POCT GLYCOSYLATED HEMOGLOBIN (HGB A1C)
Est. average glucose Bld gHb Est-mCnc: 137
Hemoglobin A1C: 6.4

## 2016-10-18 NOTE — Progress Notes (Signed)
Patient: Marco Fairfax Sr. Male    DOB: May 05, 1936   81 y.o.   MRN: [id number] Visit Date: 10/18/2016  Today's Provider: Wilhemena Durie, MD   Chief Complaint  Patient presents with  . Diabetes   Subjective:    HPI      Diabetes Mellitus Type II, Follow-up:   Lab Results  Component Value Date   HGBA1C 6.9 (H) 06/02/2016   HGBA1C 6.5 11/22/2015   HGBA1C 6.5 08/16/2015    Last seen for diabetes 4 months ago.  Management since then includes none. He reports excellent compliance with treatment. He is not having side effects.  Current symptoms include paresthesia of the feet and polydipsia and have been stable. Home blood sugar records: fasting range: 111 this morning  Episodes of hypoglycemia? yes - rarely   Current Insulin Regimen: 10-15 IU in the morning and at night Most Recent Eye Exam: 2 weeks ago- cataract surgery Weight trend: increasing steadily Prior visit with dietician: no Current diet: in general, a "healthy" diet   Current exercise: works with 5 pounds weights and leg weights every night  Pertinent Labs:    Component Value Date/Time   CHOL 141 06/02/2016 0839   TRIG 104 06/02/2016 0839   TRIG 94 11/04/2008 0000   HDL 42 06/02/2016 0839   LDLCALC 78 06/02/2016 0839   CREATININE 2.82 (H) 06/26/2016 1159   CREATININE 2.68 (H) 03/15/2014 0514    Wt Readings from Last 3 Encounters:  10/18/16 258 lb (117 kg)  10/10/16 256 lb 8 oz (116.3 kg)  10/04/16 248 lb (112.5 kg)    ------------------------------------------------------------------------   Allergies  Allergen Reactions  . Shellfish Allergy Anaphylaxis     Current Outpatient Prescriptions:  .  ACCU-CHEK FASTCLIX LANCETS MISC, TEST twice a day if needed, Disp: 102 each, Rfl: 12 .  ACCU-CHEK SMARTVIEW test strip, , Disp: , Rfl: 1 .  ACCU-CHEK SMARTVIEW test strip, TEST twice a day, Disp: 300 each, Rfl: 12 .  amiodarone (PACERONE) 200 MG tablet, take 1 tablet by mouth  once daily, Disp: 30 tablet, Rfl: 3 .  BD INSULIN SYRINGE ULTRAFINE 31G X 5/16" 0.3 ML MISC, , Disp: , Rfl: 1 .  BD INSULIN SYRINGE ULTRAFINE 31G X 5/16" 0.3 ML MISC, use twice a day as directed, Disp: 100 each, Rfl: 12 .  calcitRIOL (ROCALTROL) 0.5 MCG capsule, take 1 capsule by mouth once daily, Disp: 90 capsule, Rfl: 3 .  carvedilol (COREG) 3.125 MG tablet, Take 1 tablet (3.125 mg total) by mouth 2 (two) times daily with a meal., Disp: 180 tablet, Rfl: 3 .  cholecalciferol (VITAMIN D) 1000 UNITS tablet, Take 1,000 Units by mouth daily., Disp: , Rfl:  .  docusate sodium (COLACE) 100 MG capsule, Take 100 mg by mouth 2 (two) times daily.  , Disp: , Rfl:  .  ELIQUIS 5 MG TABS tablet, take 1 tablet by mouth twice a day, Disp: 60 tablet, Rfl: 11 .  finasteride (PROSCAR) 5 MG tablet, Take 5 mg by mouth daily., Disp: , Rfl:  .  furosemide (LASIX) 40 MG tablet, Take 1 tablet (40 mg total) by mouth every other day., Disp: 60 tablet, Rfl: 12 .  HUMALOG MIX 75/25 (75-25) 100 UNIT/ML SUSP injection, inject 5 to 10 units subcutaneously twice a day, Disp: 10 vial, Rfl: 5 .  losartan (COZAAR) 25 MG tablet, Take 25 mg by mouth daily., Disp: , Rfl:  .  Multiple Vitamins-Minerals (MULTI-VITAMIN GUMMIES) CHEW, Chew  by mouth daily., Disp: , Rfl:  .  omeprazole (PRILOSEC) 20 MG capsule, Take 20 mg by mouth daily. Monday, Wednesday, friday, Disp: , Rfl:  .  polyethylene glycol (MIRALAX / GLYCOLAX) packet, Take 17 g by mouth daily as needed for mild constipation., Disp: 14 each, Rfl: 0 .  rosuvastatin (CRESTOR) 20 MG tablet, take 1 tablet by mouth at bedtime, Disp: 90 tablet, Rfl: 3 .  tamsulosin (FLOMAX) 0.4 MG CAPS capsule, Take 0.4 mg by mouth daily., Disp: , Rfl:  .  triamcinolone (NASACORT ALLERGY 24HR) 55 MCG/ACT AERO nasal inhaler, Place 2 sprays into the nose daily., Disp: , Rfl:   Review of Systems  Constitutional: Negative for activity change, appetite change, chills, diaphoresis, fatigue, fever and  unexpected weight change.  HENT: Negative.   Eyes: Negative.   Respiratory: Negative.   Cardiovascular: Negative for chest pain, palpitations and leg swelling.  Gastrointestinal: Negative.   Endocrine: Positive for polydipsia. Negative for polyphagia and polyuria.  Allergic/Immunologic: Negative.   Hematological: Negative.   Psychiatric/Behavioral: Negative.     Social History  Substance Use Topics  . Smoking status: Former Smoker    Packs/day: 1.50    Years: 34.00    Types: Cigarettes    Quit date: 08/28/1987  . Smokeless tobacco: Never Used  . Alcohol use 0.6 oz/week    1 Cans of beer per week     Comment: Occasionally but none since his surgery in June   Objective:   BP 128/70 (BP Location: Left Arm, Patient Position: Sitting, Cuff Size: Large)   Pulse 82   Temp 98.4 F (36.9 C) (Oral)   Resp 18   Wt 258 lb (117 kg)   SpO2 92%   BMI 34.99 kg/m   Physical Exam  Constitutional: He is oriented to person, place, and time. He appears well-developed and well-nourished.  Morbidly obese white male sitting in a wheelchair. He is in no acute distress.  HENT:  Head: Normocephalic and atraumatic.  Right Ear: External ear normal.  Left Ear: External ear normal.  Nose: Nose normal.  Eyes: Conjunctivae are normal. No scleral icterus.  Cardiovascular: Normal rate, regular rhythm and normal heart sounds.   Pulmonary/Chest: Effort normal and breath sounds normal.  Abdominal: Soft.  Lymphadenopathy:    He has no cervical adenopathy.  Neurological: He is alert and oriented to person, place, and time.  Skin: Skin is warm and dry.  Psychiatric: He has a normal mood and affect. His behavior is normal. Judgment and thought content normal.        Assessment & Plan:     1. Type 2 diabetes mellitus with diabetic nephropathy, without long-term current use of insulin (HCC)  - POCT glycosylated hemoglobin (Hb A1C)--6.4 today. 2.CAD 3.HTN 4.ESRD 5Morbid Obesity 6.Afib 7.HLD      Patient seen and examined by Miguel Aschoff, MD, and note scribed by Renaldo Fiddler, CMA. I have done the exam and reviewed the above chart and it is accurate to the best of my knowledge. Development worker, community has been used in this note in any air is in the dictation or transcription are unintentional.  Wilhemena Durie, MD  [place]

## 2016-10-19 ENCOUNTER — Ambulatory Visit (INDEPENDENT_AMBULATORY_CARE_PROVIDER_SITE_OTHER): Payer: Self-pay | Admitting: Vascular Surgery

## 2016-10-19 ENCOUNTER — Ambulatory Visit (INDEPENDENT_AMBULATORY_CARE_PROVIDER_SITE_OTHER): Payer: Medicare Other

## 2016-10-19 DIAGNOSIS — Z48812 Encounter for surgical aftercare following surgery on the circulatory system: Secondary | ICD-10-CM

## 2016-10-19 DIAGNOSIS — I6523 Occlusion and stenosis of bilateral carotid arteries: Secondary | ICD-10-CM | POA: Diagnosis not present

## 2016-10-21 ENCOUNTER — Other Ambulatory Visit: Payer: Self-pay | Admitting: Family Medicine

## 2016-10-24 ENCOUNTER — Encounter: Payer: Self-pay | Admitting: Urology

## 2016-10-24 ENCOUNTER — Ambulatory Visit: Payer: Medicare Other | Admitting: Urology

## 2016-10-24 VITALS — BP 146/83 | HR 79 | Ht 72.0 in | Wt 260.5 lb

## 2016-10-24 DIAGNOSIS — R310 Gross hematuria: Secondary | ICD-10-CM

## 2016-10-24 MED ORDER — CIPROFLOXACIN HCL 500 MG PO TABS
500.0000 mg | ORAL_TABLET | Freq: Once | ORAL | Status: AC
  Administered 2016-10-24: 500 mg via ORAL

## 2016-10-24 MED ORDER — LIDOCAINE HCL 2 % EX GEL
1.0000 "application " | Freq: Once | CUTANEOUS | Status: AC
  Administered 2016-10-24: 1 via URETHRAL

## 2016-10-24 NOTE — Progress Notes (Signed)
   10/24/16  CC:  Chief Complaint  Patient presents with  . Cysto    gross hematuria    HPI:  Blood pressure (!) 146/83, pulse 79, height 6' (1.829 m), weight 118.2 kg (260 lb 8 oz). NED. A&Ox3.   No respiratory distress   Abd soft, NT, ND Normal phallus with bilateral descended testicles  Cystoscopy Procedure Note  Patient identification was confirmed, informed consent was obtained, and patient was prepped using Betadine solution.  Lidocaine jelly was administered per urethral meatus.    Preoperative abx where received prior to procedure.     Pre-Procedure: - Inspection reveals a normal caliber ureteral meatus.  Procedure: The flexible cystoscope was introduced without difficulty - No urethral strictures/lesions are present. - Enlarged prostate  - Normal bladder neck - Bilateral ureteral orifices identified - Bladder mucosa  reveals no ulcers, tumors, or lesions - No bladder stones - No trabeculation  Retroflexion shows normal bladder neck   Post-Procedure: - Patient tolerated the procedure well  Assessment/ Plan:  Hematuria likely from prostatic origin.  No follow-up necessary.

## 2016-10-30 ENCOUNTER — Encounter (INDEPENDENT_AMBULATORY_CARE_PROVIDER_SITE_OTHER): Payer: Self-pay | Admitting: Vascular Surgery

## 2016-11-01 DIAGNOSIS — I10 Essential (primary) hypertension: Secondary | ICD-10-CM | POA: Diagnosis not present

## 2016-11-01 DIAGNOSIS — N184 Chronic kidney disease, stage 4 (severe): Secondary | ICD-10-CM | POA: Diagnosis not present

## 2016-11-01 DIAGNOSIS — D631 Anemia in chronic kidney disease: Secondary | ICD-10-CM | POA: Diagnosis not present

## 2016-11-01 DIAGNOSIS — N2581 Secondary hyperparathyroidism of renal origin: Secondary | ICD-10-CM | POA: Diagnosis not present

## 2016-11-02 ENCOUNTER — Ambulatory Visit: Payer: Medicare Other

## 2016-11-10 DIAGNOSIS — I509 Heart failure, unspecified: Secondary | ICD-10-CM | POA: Diagnosis not present

## 2016-11-13 DIAGNOSIS — Z794 Long term (current) use of insulin: Secondary | ICD-10-CM | POA: Diagnosis not present

## 2016-11-13 DIAGNOSIS — B351 Tinea unguium: Secondary | ICD-10-CM | POA: Diagnosis not present

## 2016-11-13 DIAGNOSIS — L97521 Non-pressure chronic ulcer of other part of left foot limited to breakdown of skin: Secondary | ICD-10-CM | POA: Diagnosis not present

## 2016-11-13 DIAGNOSIS — E1140 Type 2 diabetes mellitus with diabetic neuropathy, unspecified: Secondary | ICD-10-CM | POA: Diagnosis not present

## 2016-11-21 DIAGNOSIS — M9901 Segmental and somatic dysfunction of cervical region: Secondary | ICD-10-CM | POA: Diagnosis not present

## 2016-11-21 DIAGNOSIS — M5136 Other intervertebral disc degeneration, lumbar region: Secondary | ICD-10-CM | POA: Diagnosis not present

## 2016-11-21 DIAGNOSIS — M9903 Segmental and somatic dysfunction of lumbar region: Secondary | ICD-10-CM | POA: Diagnosis not present

## 2016-11-21 DIAGNOSIS — M5033 Other cervical disc degeneration, cervicothoracic region: Secondary | ICD-10-CM | POA: Diagnosis not present

## 2016-11-23 ENCOUNTER — Ambulatory Visit: Payer: Medicare Other | Attending: Internal Medicine

## 2016-11-23 DIAGNOSIS — I480 Paroxysmal atrial fibrillation: Secondary | ICD-10-CM | POA: Diagnosis not present

## 2016-11-23 DIAGNOSIS — Z79899 Other long term (current) drug therapy: Secondary | ICD-10-CM

## 2016-11-23 MED ORDER — ALBUTEROL SULFATE (2.5 MG/3ML) 0.083% IN NEBU
2.5000 mg | INHALATION_SOLUTION | Freq: Once | RESPIRATORY_TRACT | Status: AC
  Administered 2016-11-23: 2.5 mg via RESPIRATORY_TRACT
  Filled 2016-11-23: qty 3

## 2016-11-30 ENCOUNTER — Other Ambulatory Visit: Payer: Self-pay | Admitting: Family Medicine

## 2016-11-30 NOTE — Telephone Encounter (Signed)
Pharmacy states that Dr. Rosanna Randy last prescribed this on 11/01/2015. This prescription has now expired and they need a new script sent in.

## 2016-12-02 NOTE — Progress Notes (Signed)
Cardiology Office Note  Date:  12/04/2016   ID:  Reginal Lutes Sr., DOB 1936-02-17, MRN [id number]  PCP:  Wilhemena Durie, MD   Chief Complaint  Patient presents with  . other    6 month follow up. Meds reviewed by the pt. verbally. "doing well."     HPI:  Mr. Teuscher is a very pleasant 81 year old gentleman with a history of  coronary artery disease, CABG, MVR Echo 04/2016 with EF 25 to 30%, RVSP 47 Paroxysmal atrial fibrillation  DVT, on warfarin,  diabetes,  smoking history  PVD, carotid endarterectomy, AAA repair May 2008 sick sinus syndrome, Pacemaker is followed by Dr. [name] systolic and diastolic CHF Baseline creatinine 2.5, now up to 3 He presents today for follow-up of his coronary artery disease  LIMA to the LAD, vein graft to the circumflex, vein graft to the PL in June 2006  mitral valve replacement with #28 Edwards logic ring S/p Pacemaker placement  blood pressure was low on previous office visit  recommended he decrease Lasix to QOD, carvedilol to 3.25 mg, hold losartan  he restarted losartan every other day Systolic pressure in the office around 100  On today's visit he is taking losartan every day Lasix every day Taking carvedilol 3.125 mg daily, alternating with twice a day every other day Reports episode recently where room was going black, had to sit down  Blood pressure on today's visit checked by myself 80 systolic bilaterally Nurses got 90/50 He was dizzy on the exam table, had to get back in his wheelchair Legs very weak, needing assistance to get up on the table Wife reports he uses walker at home  No significant lower extremity edema,  Seen by nephrology, Dr Holley Raring Leg weakness, previously did home PT  Labs reviewed cholesterol 141, LDL 77 (02/2016) Hemoglobin A1c 6.4 in 2/18  EKG personally reviewed by myself on todays visit shows Rate 80 bpm, Atrial paced rhythm   Other past medical history reviewed hospitalization March  2017 for gastroenteritis  Previous episodes of near syncope, lightheadedness, improved symptoms by holding his hydralazine and isosorbide  left hand weakness started in December 2014. carotid angiogram showed patent right carotid endarterectomy site, moderate disease on the left etiology of his symptoms was unclear. carotid disease monitored by Dr. Ronalee Belts.  Echocardiogram in the hospital 03/12/2012 showed moderate right ventricular pressures 50-60 mmHg consistent with moderate pulmonary hypertension, ejection fraction 30-35%, moderately dilated left atrium, dilated left ventricle.   PMH:   has a past medical history of AAA (abdominal aortic aneurysm)repaired; Anemia; Anginal pain ([place]); Atrial fibrillation/Flutter; Basal cell carcinoma; BPH (benign prostatic hypertrophy); Carotid artery occlusion; Chronic systolic heart failure (HCC); CKD (chronic kidney disease), stage IV ([place]); Coronary artery disease; DVT (deep venous thrombosis) ([place]); Dysrhythmia; GERD (gastroesophageal reflux disease); History of hiatal hernia; History of shingles; Hyperlipidemia; Hypertension; Hypertensive heart disease; Mitral regurgitation--s/p repair; Mixed Ischemic and Non-ischemic Cardiomyopathy; Myocardial infarction; Obesity; Orthopnea; Oxygen deficit; Presence of permanent cardiac pacemaker; PVC (premature ventricular contraction); Sick sinus syndrome ([place]); Sleep apnea; and Type II diabetes mellitus ([place]).  PSH:    Past Surgical History:  Procedure Laterality Date  . ABDOMINAL AORTIC ANEURYSM REPAIR      May 2008,   . CARDIAC CATHETERIZATION  2006   @ [facility]  . CATARACT EXTRACTION     right  . CATARACT EXTRACTION W/PHACO Left 10/04/2016   Procedure: CATARACT EXTRACTION PHACO AND INTRAOCULAR LENS PLACEMENT (IOC);  Surgeon: Estill Cotta, MD;  Location: [facility] ORS;  Service: Ophthalmology;  Laterality: Left;  [place] 01:21 AP% 25.9 CDE 39.08 Fluid pack lot # [id number]   . CORONARY ARTERY BYPASS GRAFT     CABG  with a  LIMA to the LAD, SVG to circumflex, SVG to posterolateral [id number].  Randolm Idol / REPLACE / REMOVE PACEMAKER  2006   Dr. [name]  . MITRAL VALVE REPAIR  2006    mitral valve repair with a #28 Edwards logic ring  . PACEMAKER PLACEMENT    . PERMANENT PACEMAKER GENERATOR CHANGE N/A 06/26/2012   Procedure: PERMANENT PACEMAKER GENERATOR CHANGE;  Surgeon: Deboraha Sprang, MD;  Location: [facility] CATH LAB;  Service: Cardiovascular;  Laterality: N/A;  . SKIN BIOPSY     left wrist; right elbow  . SKIN CANCER EXCISION      x 6  . SKIN CANCER EXCISION    . TRANSESOPHAGEAL ECHOCARDIOGRAM    . VASECTOMY      Current Outpatient Prescriptions  Medication Sig Dispense Refill  . ACCU-CHEK FASTCLIX LANCETS MISC TEST twice a day if needed 102 each 12  . ACCU-CHEK SMARTVIEW test strip   1  . ACCU-CHEK SMARTVIEW test strip TEST twice a day 300 each 12  . amiodarone (PACERONE) 200 MG tablet take 1 tablet by mouth once daily 30 tablet 3  . BD INSULIN SYRINGE ULTRAFINE 31G X 5/16" 0.3 ML MISC   1  . BD INSULIN SYRINGE ULTRAFINE 31G X 5/16" 0.3 ML MISC use twice a day as directed 100 each 12  . calcitRIOL (ROCALTROL) 0.5 MCG capsule take 1 capsule by mouth once daily 90 capsule 3  . carvedilol (COREG) 3.125 MG tablet Take 1 tablet (3.125 mg total) by mouth 2 (two) times daily with a meal. 180 tablet 3  . cholecalciferol (VITAMIN D) 1000 UNITS tablet Take 1,000 Units by mouth daily.    [name] docusate sodium (COLACE) 100 MG capsule Take 100 mg by mouth 2 (two) times daily.      [name] ELIQUIS 5 MG TABS tablet take 1 tablet by mouth twice a day 60 tablet 11  . finasteride (PROSCAR) 5 MG tablet Take 5 mg by mouth daily.    . furosemide (LASIX) 40 MG tablet Take 1 tablet (40 mg total) by mouth every other day. 60 tablet 12  . HUMALOG MIX 75/25 (75-25) 100 UNIT/ML SUSP injection inject 5 to 10 units subcutaneously twice a day 10 vial 5  . losartan (COZAAR) 25 MG tablet Take 25 mg by mouth daily.    . Multiple  Vitamins-Minerals (MULTI-VITAMIN GUMMIES) CHEW Chew by mouth daily.    [name] omeprazole (PRILOSEC) 20 MG capsule Take 20 mg by mouth daily. Monday, Wednesday, friday    . OXYGEN Inhale 1.5 L into the lungs.    . polyethylene glycol (MIRALAX / GLYCOLAX) packet Take 17 g by mouth daily as needed for mild constipation. 14 each 0  . rosuvastatin (CRESTOR) 20 MG tablet take 1 tablet by mouth at bedtime 90 tablet 3  . tamsulosin (FLOMAX) 0.4 MG CAPS capsule take 1 capsule by mouth once daily 30 capsule 0  . triamcinolone (NASACORT ALLERGY 24HR) 55 MCG/ACT AERO nasal inhaler Place 2 sprays into the nose daily.     No current facility-administered medications for this visit.      Allergies:   Shellfish allergy   Social History:  The patient  reports that he quit smoking about 29 years ago. His smoking use included Cigarettes. He has a 51.00 pack-year smoking history. He has never used smokeless tobacco. He  reports that he drinks about 0.6 oz of alcohol per week . He reports that he does not use drugs.   Family History:   family history includes Diabetes in his father, sister, and son; Heart attack in his sister, sister, and sister; Heart attack (age of onset: 49) in his brother; Heart attack (age of onset: 25) in his brother; Heart attack (age of onset: 43) in his brother; Heart attack (age of onset: 49) in his father; Heart attack (age of onset: 25) in his mother; Heart disease in his brother and father; Hypertension in his sister.    Review of Systems: Review of Systems  Constitutional: Negative.   Respiratory: Negative.   Cardiovascular: Negative.   Gastrointestinal: Negative.   Musculoskeletal: Negative.        Gait instability, leg weakness  Neurological: Positive for dizziness.  Psychiatric/Behavioral: Negative.   All other systems reviewed and are negative.    PHYSICAL EXAM: VS:  BP (!) 90/50 (BP Location: Left Arm, Patient Position: Sitting, Cuff Size: Normal)   Pulse 80   Ht 6'  (1.829 m)   Wt 256 lb 8 oz (116.3 kg)   BMI 34.79 kg/m  , BMI Body mass index is 34.79 kg/m. GEN: Well nourished, well developed, in no acute distress  HEENT: normal  Neck: no JVD, carotid bruits, or masses Cardiac: RRR; no murmurs, rubs, or gallops,no edema  Respiratory:  clear to auscultation bilaterally, normal work of breathing GI: soft, nontender, nondistended, + BS MS: no deformity or atrophy  Skin: warm and dry, no rash Neuro:  Strength and sensation are intact Psych: euthymic mood, full affect    Recent Labs: 06/12/2016: Hemoglobin 11.6 06/26/2016: BUN 43; Creatinine, Ser 2.82; Platelets 182; Potassium 4.7; Sodium 145 10/10/2016: ALT 13; TSH 2.060    Lipid Panel Lab Results  Component Value Date   CHOL 141 06/02/2016   HDL 42 06/02/2016   LDLCALC 78 06/02/2016   TRIG 104 06/02/2016      Wt Readings from Last 3 Encounters:  12/04/16 256 lb 8 oz (116.3 kg)  10/24/16 260 lb 8 oz (118.2 kg)  10/18/16 258 lb (117 kg)       ASSESSMENT AND PLAN:  Mixed hyperlipidemia - Plan: EKG 12-Lead /lipidsok  Coronary artery disease of native artery of native heart with stable angina pectoris ([place]) - Plan: EKG 12-Lead Currently with no symptoms of angina. No further workup at this time. Continue current medication regimen.  Ischemic cardiomyopathy  s/p CABG - Plan: EKG 12-Lead No room to advance his medical therapy given hypotension  PAF (paroxysmal atrial fibrillation) ([place]) - Plan: EKG 12-Lead On anticoagulation, paced rhythm High fall risk, will need to monitor for falls  Chronic systolic heart failure ([place]) - Plan: EKG 12-Lead Appears euvolemic. No recent lab work available Could consider Lasix every other day given hypotension  Morbid obesity ([place]) Unable to exercise, he is trying to watch his diet  Pacemaker-Medtronic - Plan: EKG 12-Lead  S/P CABG (coronary artery bypass graft) - Plan: EKG 12-Lead  Stage 4 chronic renal impairment associated with type 2  diabetes mellitus ([place]) Followed by nephrology  Orthostasis Significantly low blood pressure on today's visit 80 systolic on my check bilaterally Recommended he stop tamsulosin, decrease carvedilol down to 3.125 mg daily Consider Lasix every other day On days like today with very low blood pressure and asymptomatic, recommended he stop all of his medications until symptoms improve including Lasix, tamsulosin, Coreg, losartan We did discuss medication such as midodrine if  he becomes increasingly symptomatic  Disposition:   F/U  6 months   Total encounter time more than 25 minutes  Greater than 50% was spent in counseling and coordination of care with the patient    Orders Placed This Encounter  Procedures  . EKG 12-Lead     Signed, Esmond Plants, M.D., Ph.D. 12/04/2016  [place], [place]

## 2016-12-04 ENCOUNTER — Ambulatory Visit (INDEPENDENT_AMBULATORY_CARE_PROVIDER_SITE_OTHER): Payer: Medicare Other | Admitting: Cardiovascular Disease

## 2016-12-04 ENCOUNTER — Encounter: Payer: Self-pay | Admitting: Cardiovascular Disease

## 2016-12-04 VITALS — BP 90/50 | HR 80 | Ht 72.0 in | Wt 256.5 lb

## 2016-12-04 DIAGNOSIS — N184 Chronic kidney disease, stage 4 (severe): Secondary | ICD-10-CM

## 2016-12-04 DIAGNOSIS — E1122 Type 2 diabetes mellitus with diabetic chronic kidney disease: Secondary | ICD-10-CM

## 2016-12-04 DIAGNOSIS — Z950 Presence of cardiac pacemaker: Secondary | ICD-10-CM | POA: Diagnosis not present

## 2016-12-04 DIAGNOSIS — I5022 Chronic systolic (congestive) heart failure: Secondary | ICD-10-CM

## 2016-12-04 DIAGNOSIS — I255 Ischemic cardiomyopathy: Secondary | ICD-10-CM | POA: Diagnosis not present

## 2016-12-04 DIAGNOSIS — I951 Orthostatic hypotension: Secondary | ICD-10-CM | POA: Insufficient documentation

## 2016-12-04 DIAGNOSIS — E782 Mixed hyperlipidemia: Secondary | ICD-10-CM | POA: Diagnosis not present

## 2016-12-04 DIAGNOSIS — Z951 Presence of aortocoronary bypass graft: Secondary | ICD-10-CM

## 2016-12-04 DIAGNOSIS — I25118 Atherosclerotic heart disease of native coronary artery with other forms of angina pectoris: Secondary | ICD-10-CM | POA: Diagnosis not present

## 2016-12-04 DIAGNOSIS — I480 Paroxysmal atrial fibrillation: Secondary | ICD-10-CM | POA: Diagnosis not present

## 2016-12-04 NOTE — Patient Instructions (Addendum)
Medication Instructions:   Please take losartan once a day Take coreg one a day   Lasix and tamsulosin can drop pressures  If pressure runs low, dizzy Hold the coreg, losartan, lasix, tamsulosin  Labwork:  No new labs needed  Testing/Procedures:  No further testing at this time   I recommend watching educational videos on topics of interest to you at:       [email or web address].com  Enter code: HEARTCARE    Follow-Up: It was a pleasure seeing you in the office today. Please call us if you have new issues that need to be addressed before your next appt.  [phone]  Your physician wants you to follow-up in: 6 months.  You will receive a reminder letter in the mail two months in advance. If you don't receive a letter, please call our office to schedule the follow-up appointment.  If you need a refill on your cardiac medications before your next appointment, please call your pharmacy.

## 2016-12-11 DIAGNOSIS — I509 Heart failure, unspecified: Secondary | ICD-10-CM | POA: Diagnosis not present

## 2016-12-12 DIAGNOSIS — M9903 Segmental and somatic dysfunction of lumbar region: Secondary | ICD-10-CM | POA: Diagnosis not present

## 2016-12-12 DIAGNOSIS — M5136 Other intervertebral disc degeneration, lumbar region: Secondary | ICD-10-CM | POA: Diagnosis not present

## 2016-12-12 DIAGNOSIS — M9901 Segmental and somatic dysfunction of cervical region: Secondary | ICD-10-CM | POA: Diagnosis not present

## 2016-12-12 DIAGNOSIS — M5033 Other cervical disc degeneration, cervicothoracic region: Secondary | ICD-10-CM | POA: Diagnosis not present

## 2016-12-19 ENCOUNTER — Encounter: Payer: Self-pay | Admitting: Internal Medicine

## 2016-12-19 NOTE — Progress Notes (Signed)
DLCO is low    Needs high resolution  CT to look for evidence of amio lung toxicity

## 2016-12-27 ENCOUNTER — Telehealth: Payer: Self-pay | Admitting: Internal Medicine

## 2016-12-27 DIAGNOSIS — N289 Disorder of kidney and ureter, unspecified: Secondary | ICD-10-CM

## 2016-12-27 DIAGNOSIS — R942 Abnormal results of pulmonary function studies: Secondary | ICD-10-CM

## 2016-12-27 DIAGNOSIS — R0600 Dyspnea, unspecified: Secondary | ICD-10-CM

## 2016-12-27 NOTE — Telephone Encounter (Signed)
I called and spoke with the patient and advised him that Dr. [name] had reviewed his PFT results- this shows that his DLCO is low and he will need to have a high resolution ct of the chest.   I have notified the patient of this and advised him I will arrange for this to be done at [facility].  He is agreeable. I will call him back with a date and time.

## 2016-12-27 NOTE — Telephone Encounter (Signed)
I called CT scheduling at [facility]- [phone].  High resolution chest CT scheduled for 01/08/17 at 10:00 am- patient to arrive at 9:45 am NPO for 4 hours prior Repeat BMP  I have notified the patient and his wife. The patient will come on 01/04/17 to the [place] office for labs.  He is agreeable with the date and time for the procedure.  The patient's wife verbalizes understanding of the above instructions.

## 2016-12-31 ENCOUNTER — Other Ambulatory Visit: Payer: Self-pay | Admitting: Physician Assistant

## 2017-01-04 ENCOUNTER — Other Ambulatory Visit (INDEPENDENT_AMBULATORY_CARE_PROVIDER_SITE_OTHER): Payer: Medicare Other

## 2017-01-04 DIAGNOSIS — N289 Disorder of kidney and ureter, unspecified: Secondary | ICD-10-CM | POA: Diagnosis not present

## 2017-01-05 LAB — BASIC METABOLIC PANEL
BUN / CREAT RATIO: 18 (ref 10–24)
BUN: 51 mg/dL — AB (ref 8–27)
CHLORIDE: 97 mmol/L (ref 96–106)
CO2: 34 mmol/L — AB (ref 18–29)
Calcium: 9.6 mg/dL (ref 8.6–10.2)
Creatinine, Ser: 2.82 mg/dL — ABNORMAL HIGH (ref 0.76–1.27)
GFR calc Af Amer: 24 mL/min/{1.73_m2} — ABNORMAL LOW (ref >59)
GFR calc non Af Amer: 20 mL/min/{1.73_m2} — ABNORMAL LOW (ref >59)
GLUCOSE: 87 mg/dL (ref 65–99)
Potassium: 4.1 mmol/L (ref 3.5–5.2)
SODIUM: 144 mmol/L (ref 134–144)

## 2017-01-07 ENCOUNTER — Other Ambulatory Visit: Payer: Self-pay | Admitting: Cardiovascular Disease

## 2017-01-08 ENCOUNTER — Ambulatory Visit
Admission: RE | Admit: 2017-01-08 | Discharge: 2017-01-08 | Disposition: A | Discharge status: Home / Self Care | Payer: Medicare Other | Source: Ambulatory Visit | Attending: Internal Medicine | Admitting: Internal Medicine

## 2017-01-08 DIAGNOSIS — J479 Bronchiectasis, uncomplicated: Secondary | ICD-10-CM | POA: Insufficient documentation

## 2017-01-08 DIAGNOSIS — J439 Emphysema, unspecified: Secondary | ICD-10-CM | POA: Diagnosis not present

## 2017-01-08 DIAGNOSIS — J90 Pleural effusion, not elsewhere classified: Secondary | ICD-10-CM | POA: Insufficient documentation

## 2017-01-08 DIAGNOSIS — Z951 Presence of aortocoronary bypass graft: Secondary | ICD-10-CM | POA: Insufficient documentation

## 2017-01-08 DIAGNOSIS — R942 Abnormal results of pulmonary function studies: Secondary | ICD-10-CM | POA: Insufficient documentation

## 2017-01-08 DIAGNOSIS — R0600 Dyspnea, unspecified: Secondary | ICD-10-CM | POA: Insufficient documentation

## 2017-01-08 DIAGNOSIS — I700 Atherosclerosis of aorta: Secondary | ICD-10-CM | POA: Diagnosis not present

## 2017-01-08 DIAGNOSIS — I2510 Atherosclerotic heart disease of native coronary artery without angina pectoris: Secondary | ICD-10-CM | POA: Insufficient documentation

## 2017-01-09 ENCOUNTER — Telehealth: Payer: Self-pay | Admitting: Cardiology

## 2017-01-09 ENCOUNTER — Ambulatory Visit (INDEPENDENT_AMBULATORY_CARE_PROVIDER_SITE_OTHER): Payer: Medicare Other | Admitting: *Deleted

## 2017-01-09 DIAGNOSIS — I495 Sick sinus syndrome: Secondary | ICD-10-CM | POA: Diagnosis not present

## 2017-01-09 NOTE — Progress Notes (Signed)
Remote pacemaker transmission.   

## 2017-01-09 NOTE — Telephone Encounter (Signed)
Spoke with pt and reminded pt of remote transmission that is due today. Pt verbalized understanding.   

## 2017-01-10 ENCOUNTER — Encounter: Payer: Self-pay | Admitting: Cardiology

## 2017-01-10 DIAGNOSIS — I509 Heart failure, unspecified: Secondary | ICD-10-CM | POA: Diagnosis not present

## 2017-01-10 LAB — CUP PACEART REMOTE DEVICE CHECK
Battery Impedance: 275 Ohm
Battery Remaining Longevity: 101 mo
Battery Voltage: 2.79 V
Brady Statistic AP VS Percent: 89 %
Date Time Interrogation Session: 20180515150442
Implantable Lead Implant Date: 20060615
Implantable Lead Location: 753860
Implantable Lead Model: 5076
Lead Channel Pacing Threshold Pulse Width: 0.4 ms
Lead Channel Pacing Threshold Pulse Width: 0.4 ms
Lead Channel Setting Pacing Amplitude: 2 V
Lead Channel Setting Pacing Pulse Width: 0.4 ms
MDC IDC LEAD IMPLANT DT: 20060615
MDC IDC LEAD LOCATION: 753859
MDC IDC MSMT LEADCHNL RA IMPEDANCE VALUE: 435 Ohm
MDC IDC MSMT LEADCHNL RA PACING THRESHOLD AMPLITUDE: 0.5 V
MDC IDC MSMT LEADCHNL RV IMPEDANCE VALUE: 428 Ohm
MDC IDC MSMT LEADCHNL RV PACING THRESHOLD AMPLITUDE: 1.125 V
MDC IDC PG IMPLANT DT: 20131030
MDC IDC SET LEADCHNL RV PACING AMPLITUDE: 2.5 V
MDC IDC SET LEADCHNL RV SENSING SENSITIVITY: 4 mV
MDC IDC STAT BRADY AP VP PERCENT: 10 %
MDC IDC STAT BRADY AS VP PERCENT: 1 %
MDC IDC STAT BRADY AS VS PERCENT: 1 %

## 2017-01-11 ENCOUNTER — Other Ambulatory Visit: Payer: Self-pay | Admitting: *Deleted

## 2017-01-11 DIAGNOSIS — R0609 Other forms of dyspnea: Principal | ICD-10-CM

## 2017-01-11 NOTE — Progress Notes (Signed)
am

## 2017-01-21 ENCOUNTER — Other Ambulatory Visit: Payer: Self-pay | Admitting: Family Medicine

## 2017-01-29 ENCOUNTER — Telehealth: Payer: Self-pay | Admitting: Urology

## 2017-01-29 NOTE — Telephone Encounter (Signed)
Pt's wife called and wants to know if pt can get a refill on finasteride.  Please give pt a call at [phone].

## 2017-01-29 NOTE — Telephone Encounter (Addendum)
Marco [name] from [facility] called office stating that she has researched Marco [name] meds and chart and states that Marco [name] didn't start the Finasteride. States she found in his chart that Marco [name] started pt on the medication Finasteride.

## 2017-01-29 NOTE — Telephone Encounter (Signed)
Spoke to spouse. Advised  Showing Finasteride last managed by Dr. Rosanna Randy. Spouse says she will contact PCP's office.

## 2017-01-30 MED ORDER — FINASTERIDE 5 MG PO TABS: 5.0000 mg | ORAL_TABLET | Freq: Every day | ORAL | 5 refills | Status: DC

## 2017-01-30 NOTE — Telephone Encounter (Signed)
Spoke to patient. Apologized for the misunderstanding. Verified pharmacy. Sent refill. Pt was grateful.

## 2017-01-30 NOTE — Addendum Note (Signed)
Addended by: Milta Deiters on: 01/30/2017 10:33 AM   Modules accepted: Orders

## 2017-01-31 DIAGNOSIS — B029 Zoster without complications: Secondary | ICD-10-CM | POA: Diagnosis not present

## 2017-01-31 DIAGNOSIS — L570 Actinic keratosis: Secondary | ICD-10-CM | POA: Diagnosis not present

## 2017-01-31 DIAGNOSIS — D485 Neoplasm of uncertain behavior of skin: Secondary | ICD-10-CM | POA: Diagnosis not present

## 2017-01-31 DIAGNOSIS — C44521 Squamous cell carcinoma of skin of breast: Secondary | ICD-10-CM | POA: Diagnosis not present

## 2017-02-01 ENCOUNTER — Emergency Department: Payer: Medicare Other

## 2017-02-01 ENCOUNTER — Inpatient Hospital Stay
Admission: EM | Admit: 2017-02-01 | Discharge: 2017-02-07 | DRG: 312 | Disposition: A | Discharge status: Skilled Nursing Facility | Payer: Medicare Other | Attending: Internal Medicine | Admitting: Internal Medicine

## 2017-02-01 ENCOUNTER — Encounter: Payer: Self-pay | Admitting: *Deleted

## 2017-02-01 DIAGNOSIS — E860 Dehydration: Secondary | ICD-10-CM | POA: Diagnosis not present

## 2017-02-01 DIAGNOSIS — N401 Enlarged prostate with lower urinary tract symptoms: Secondary | ICD-10-CM | POA: Diagnosis present

## 2017-02-01 DIAGNOSIS — Z951 Presence of aortocoronary bypass graft: Secondary | ICD-10-CM

## 2017-02-01 DIAGNOSIS — I482 Chronic atrial fibrillation: Secondary | ICD-10-CM | POA: Diagnosis not present

## 2017-02-01 DIAGNOSIS — R0602 Shortness of breath: Secondary | ICD-10-CM

## 2017-02-01 DIAGNOSIS — R42 Dizziness and giddiness: Secondary | ICD-10-CM | POA: Diagnosis not present

## 2017-02-01 DIAGNOSIS — R809 Proteinuria, unspecified: Secondary | ICD-10-CM | POA: Diagnosis not present

## 2017-02-01 DIAGNOSIS — Z961 Presence of intraocular lens: Secondary | ICD-10-CM | POA: Diagnosis present

## 2017-02-01 DIAGNOSIS — R55 Syncope and collapse: Secondary | ICD-10-CM

## 2017-02-01 DIAGNOSIS — N390 Urinary tract infection, site not specified: Secondary | ICD-10-CM

## 2017-02-01 DIAGNOSIS — I447 Left bundle-branch block, unspecified: Secondary | ICD-10-CM | POA: Diagnosis not present

## 2017-02-01 DIAGNOSIS — R319 Hematuria, unspecified: Secondary | ICD-10-CM

## 2017-02-01 DIAGNOSIS — Z79899 Other long term (current) drug therapy: Secondary | ICD-10-CM

## 2017-02-01 DIAGNOSIS — I129 Hypertensive chronic kidney disease with stage 1 through stage 4 chronic kidney disease, or unspecified chronic kidney disease: Secondary | ICD-10-CM | POA: Diagnosis not present

## 2017-02-01 DIAGNOSIS — J440 Chronic obstructive pulmonary disease with acute lower respiratory infection: Secondary | ICD-10-CM | POA: Diagnosis not present

## 2017-02-01 DIAGNOSIS — I130 Hypertensive heart and chronic kidney disease with heart failure and stage 1 through stage 4 chronic kidney disease, or unspecified chronic kidney disease: Secondary | ICD-10-CM | POA: Diagnosis present

## 2017-02-01 DIAGNOSIS — N3001 Acute cystitis with hematuria: Secondary | ICD-10-CM | POA: Diagnosis not present

## 2017-02-01 DIAGNOSIS — Z794 Long term (current) use of insulin: Secondary | ICD-10-CM

## 2017-02-01 DIAGNOSIS — E1122 Type 2 diabetes mellitus with diabetic chronic kidney disease: Secondary | ICD-10-CM | POA: Diagnosis not present

## 2017-02-01 DIAGNOSIS — I6523 Occlusion and stenosis of bilateral carotid arteries: Secondary | ICD-10-CM | POA: Diagnosis not present

## 2017-02-01 DIAGNOSIS — I429 Cardiomyopathy, unspecified: Secondary | ICD-10-CM | POA: Diagnosis present

## 2017-02-01 DIAGNOSIS — R6889 Other general symptoms and signs: Secondary | ICD-10-CM | POA: Diagnosis not present

## 2017-02-01 DIAGNOSIS — E784 Other hyperlipidemia: Secondary | ICD-10-CM | POA: Diagnosis not present

## 2017-02-01 DIAGNOSIS — Z6833 Body mass index (BMI) 33.0-33.9, adult: Secondary | ICD-10-CM

## 2017-02-01 DIAGNOSIS — Z7401 Bed confinement status: Secondary | ICD-10-CM | POA: Diagnosis not present

## 2017-02-01 DIAGNOSIS — J9610 Chronic respiratory failure, unspecified whether with hypoxia or hypercapnia: Secondary | ICD-10-CM | POA: Diagnosis present

## 2017-02-01 DIAGNOSIS — L8990 Pressure ulcer of unspecified site, unspecified stage: Secondary | ICD-10-CM | POA: Insufficient documentation

## 2017-02-01 DIAGNOSIS — I951 Orthostatic hypotension: Principal | ICD-10-CM | POA: Diagnosis present

## 2017-02-01 DIAGNOSIS — D631 Anemia in chronic kidney disease: Secondary | ICD-10-CM | POA: Diagnosis not present

## 2017-02-01 DIAGNOSIS — N179 Acute kidney failure, unspecified: Secondary | ICD-10-CM | POA: Diagnosis not present

## 2017-02-01 DIAGNOSIS — N184 Chronic kidney disease, stage 4 (severe): Secondary | ICD-10-CM | POA: Diagnosis present

## 2017-02-01 DIAGNOSIS — N2581 Secondary hyperparathyroidism of renal origin: Secondary | ICD-10-CM | POA: Diagnosis present

## 2017-02-01 DIAGNOSIS — R2681 Unsteadiness on feet: Secondary | ICD-10-CM | POA: Diagnosis not present

## 2017-02-01 DIAGNOSIS — I5022 Chronic systolic (congestive) heart failure: Secondary | ICD-10-CM | POA: Diagnosis not present

## 2017-02-01 DIAGNOSIS — R531 Weakness: Secondary | ICD-10-CM

## 2017-02-01 DIAGNOSIS — Z85828 Personal history of other malignant neoplasm of skin: Secondary | ICD-10-CM

## 2017-02-01 DIAGNOSIS — J449 Chronic obstructive pulmonary disease, unspecified: Secondary | ICD-10-CM | POA: Diagnosis present

## 2017-02-01 DIAGNOSIS — N189 Chronic kidney disease, unspecified: Secondary | ICD-10-CM

## 2017-02-01 DIAGNOSIS — Z86718 Personal history of other venous thrombosis and embolism: Secondary | ICD-10-CM

## 2017-02-01 DIAGNOSIS — Z8249 Family history of ischemic heart disease and other diseases of the circulatory system: Secondary | ICD-10-CM

## 2017-02-01 DIAGNOSIS — K219 Gastro-esophageal reflux disease without esophagitis: Secondary | ICD-10-CM | POA: Diagnosis present

## 2017-02-01 DIAGNOSIS — E870 Hyperosmolality and hypernatremia: Secondary | ICD-10-CM | POA: Diagnosis not present

## 2017-02-01 DIAGNOSIS — Z833 Family history of diabetes mellitus: Secondary | ICD-10-CM

## 2017-02-01 DIAGNOSIS — E785 Hyperlipidemia, unspecified: Secondary | ICD-10-CM | POA: Diagnosis not present

## 2017-02-01 DIAGNOSIS — Z91013 Allergy to seafood: Secondary | ICD-10-CM

## 2017-02-01 DIAGNOSIS — N138 Other obstructive and reflux uropathy: Secondary | ICD-10-CM | POA: Diagnosis present

## 2017-02-01 DIAGNOSIS — I2510 Atherosclerotic heart disease of native coronary artery without angina pectoris: Secondary | ICD-10-CM | POA: Diagnosis not present

## 2017-02-01 DIAGNOSIS — R498 Other voice and resonance disorders: Secondary | ICD-10-CM | POA: Diagnosis not present

## 2017-02-01 DIAGNOSIS — R339 Retention of urine, unspecified: Secondary | ICD-10-CM | POA: Diagnosis not present

## 2017-02-01 DIAGNOSIS — I5031 Acute diastolic (congestive) heart failure: Secondary | ICD-10-CM | POA: Diagnosis not present

## 2017-02-01 DIAGNOSIS — Z9981 Dependence on supplemental oxygen: Secondary | ICD-10-CM

## 2017-02-01 DIAGNOSIS — N400 Enlarged prostate without lower urinary tract symptoms: Secondary | ICD-10-CM | POA: Diagnosis not present

## 2017-02-01 DIAGNOSIS — Z9842 Cataract extraction status, left eye: Secondary | ICD-10-CM

## 2017-02-01 DIAGNOSIS — I252 Old myocardial infarction: Secondary | ICD-10-CM

## 2017-02-01 DIAGNOSIS — Z7901 Long term (current) use of anticoagulants: Secondary | ICD-10-CM

## 2017-02-01 DIAGNOSIS — Z87891 Personal history of nicotine dependence: Secondary | ICD-10-CM

## 2017-02-01 DIAGNOSIS — I495 Sick sinus syndrome: Secondary | ICD-10-CM | POA: Diagnosis not present

## 2017-02-01 DIAGNOSIS — I959 Hypotension, unspecified: Secondary | ICD-10-CM | POA: Diagnosis not present

## 2017-02-01 DIAGNOSIS — G4709 Other insomnia: Secondary | ICD-10-CM | POA: Diagnosis not present

## 2017-02-01 DIAGNOSIS — M6281 Muscle weakness (generalized): Secondary | ICD-10-CM | POA: Diagnosis not present

## 2017-02-01 DIAGNOSIS — I10 Essential (primary) hypertension: Secondary | ICD-10-CM | POA: Diagnosis not present

## 2017-02-01 DIAGNOSIS — K210 Gastro-esophageal reflux disease with esophagitis: Secondary | ICD-10-CM | POA: Diagnosis not present

## 2017-02-01 DIAGNOSIS — I4891 Unspecified atrial fibrillation: Secondary | ICD-10-CM | POA: Diagnosis not present

## 2017-02-01 DIAGNOSIS — I371 Nonrheumatic pulmonary valve insufficiency: Secondary | ICD-10-CM | POA: Diagnosis not present

## 2017-02-01 HISTORY — DX: Orthostatic hypotension: I95.1

## 2017-02-01 LAB — CBC WITH DIFFERENTIAL/PLATELET
Basophils Absolute: 0.1 10*3/uL (ref 0–0.1)
Basophils Relative: 1 %
EOS PCT: 4 %
Eosinophils Absolute: 0.3 10*3/uL (ref 0–0.7)
HCT: 37.2 % — ABNORMAL LOW (ref 40.0–52.0)
Hemoglobin: 12.3 g/dL — ABNORMAL LOW (ref 13.0–18.0)
LYMPHS PCT: 16 %
Lymphs Abs: 1.2 10*3/uL (ref 1.0–3.6)
MCH: 30.3 pg (ref 26.0–34.0)
MCHC: 33 g/dL (ref 32.0–36.0)
MCV: 91.8 fL (ref 80.0–100.0)
Monocytes Absolute: 0.8 10*3/uL (ref 0.2–1.0)
Monocytes Relative: 11 %
NEUTROS PCT: 68 %
Neutro Abs: 4.7 10*3/uL (ref 1.4–6.5)
PLATELETS: 152 10*3/uL (ref 150–440)
RBC: 4.05 MIL/uL — AB (ref 4.40–5.90)
RDW: 14.2 % (ref 11.5–14.5)
WBC: 7.1 10*3/uL (ref 3.8–10.6)

## 2017-02-01 LAB — URINALYSIS, COMPLETE (UACMP) WITH MICROSCOPIC
BACTERIA UA: NONE SEEN
Bilirubin Urine: NEGATIVE
Glucose, UA: 150 mg/dL — AB
Ketones, ur: NEGATIVE mg/dL
Nitrite: NEGATIVE
PROTEIN: 30 mg/dL — AB
Specific Gravity, Urine: 1.012 (ref 1.005–1.030)
Squamous Epithelial / LPF: NONE SEEN
pH: 6 (ref 5.0–8.0)

## 2017-02-01 LAB — COMPREHENSIVE METABOLIC PANEL
ALT: 18 U/L (ref 17–63)
AST: 18 U/L (ref 15–41)
Albumin: 3.2 g/dL — ABNORMAL LOW (ref 3.5–5.0)
Alkaline Phosphatase: 55 U/L (ref 38–126)
Anion gap: 9 (ref 5–15)
BUN: 57 mg/dL — AB (ref 6–20)
CHLORIDE: 99 mmol/L — AB (ref 101–111)
CO2: 34 mmol/L — ABNORMAL HIGH (ref 22–32)
CREATININE: 2.93 mg/dL — AB (ref 0.61–1.24)
Calcium: 9.6 mg/dL (ref 8.9–10.3)
GFR calc Af Amer: 22 mL/min — ABNORMAL LOW (ref >60)
GFR, EST NON AFRICAN AMERICAN: 19 mL/min — AB (ref >60)
Glucose, Bld: 157 mg/dL — ABNORMAL HIGH (ref 65–99)
Potassium: 3.5 mmol/L (ref 3.5–5.1)
SODIUM: 142 mmol/L (ref 135–145)
Total Bilirubin: 0.6 mg/dL (ref 0.3–1.2)
Total Protein: 7 g/dL (ref 6.5–8.1)

## 2017-02-01 LAB — TROPONIN I: Troponin I: <0.03 ng/mL (ref <0.03)

## 2017-02-01 MED ORDER — SODIUM CHLORIDE 0.9 % IV SOLN
Freq: Once | INTRAVENOUS | Status: AC
  Administered 2017-02-01: 23:00:00 via INTRAVENOUS

## 2017-02-01 MED ORDER — DEXTROSE 5 % IV SOLN
1.0000 g | Freq: Once | INTRAVENOUS | Status: AC
  Administered 2017-02-01: 1 g via INTRAVENOUS
  Filled 2017-02-01: qty 10

## 2017-02-01 NOTE — ED Triage Notes (Signed)
Pt was sitting at computer in house, became weak, generalized weakness and dizziness, pt reports symptoms went away when EMS arrived, pt denies pain and any other symptoms, no neuro deficits, pt denies pain

## 2017-02-01 NOTE — ED Notes (Signed)
Family leaving at this time, family taking pt's belongings home

## 2017-02-01 NOTE — ED Provider Notes (Signed)
[facility] Emergency Department Provider Note       Time seen: ----------------------------------------- 9:18 PM on 02/01/2017 -----------------------------------------     I have reviewed the triage vital signs and the nursing notes.   HISTORY   Chief Complaint No chief complaint on file.    HPI Gilmore List Sr. is a 81 y.o. male who presents to the ED for near syncope. Patient states he was at home and in his normal health when he began to feel lightheaded and feel dizzy. Patient states he's had similar episodes but never this severe. He denies fevers, chills, chest pain, shortness of breath, vomiting or diarrhea. Patient states he has not had any recent significant changes in his medications. He has been eating and drinking normally.   Past Medical History:  Diagnosis Date  . AAA (abdominal aortic aneurysm)repaired    a. 12/2006 s/p repair of 6cm infrarenal AAA with 7mm Dacron graft.  . Anemia   . Anginal pain ([place])   . Atrial fibrillation/Flutter    a. Dx 2014;  b. 10/2012 s/p TEE/DCCV;  c. On amio and Eliquis (CHA2DS2VASc = 6).  . Basal cell carcinoma    a. 2013 x 2 back and left neck  . BPH (benign prostatic hypertrophy)   . Carotid artery occlusion    a. 11/2008 s/p R CEA.  . Chronic systolic heart failure ([place])    a. 02/2012 Echo: EF 30-35%, Pulm HTN, and modest RV dysfunction;  b. 10/2012 TEE: EF 25-30%, mild conc LVH, diff HK, mod RV dysfxn, mod dil RA/LA, mild MR, no MS, nl MVR, mod TR, Ao sclerosis.  . CKD (chronic kidney disease), stage IV ([place])   . Coronary artery disease    a. 1999 PCI RCA;  b. 01/2005 NSTEMI: CABG x 3 (LIMA->LAD, VG->OM, VG->LPL) @ time of MVR.  [name] DVT (deep venous thrombosis) ([place])   . Dysrhythmia   . GERD (gastroesophageal reflux disease)   . History of hiatal hernia   . History of shingles   . Hyperlipidemia   . Hypertension   . Hypertensive heart disease   . Mitral regurgitation--s/p repair    a.  01/2005 MVR: 28-mm Edwards ET Logix ring annuloplasty; b. 10/2012 TEE: normal fxning MV ring with mild MR, no MS.  . Mixed Ischemic and Non-ischemic Cardiomyopathy    a. 02/2012 Echo: EF 30-35%, Pulm HTN, and modest RV dysfunction;  b. 10/2012 TEE: EF 25-30%, mild conc LVH, diff HK, mod RV dysfxn, mod dil RA/LA, mild MR, no MS, nl MVR, mod TR, Ao sclerosis.  . Myocardial infarction ([place])    2006  . Obesity   . Orthopnea   . Oxygen deficit    HS  . Presence of permanent cardiac pacemaker   . PVC (premature ventricular contraction)    a. 02/2009 s/p RFCA ([place]).  . Sick sinus syndrome ([place])    a. 01/2005 s/p PPM 2/2 heart block following CABG;  b. 05/2012 Gen Change: MDT Adapta ADDRL1 DC PPM, ser # [id number].  . Sleep apnea    a. Intolerant of CPAP  . Type II diabetes mellitus [facility])     Patient Active Problem List   Diagnosis Date Noted  . Orthostasis 12/04/2016  . LGI bleed 06/10/2016  . Acute blood loss anemia 06/10/2016  . Chronic respiratory failure with hypoxia, on home O2 therapy ([place]) 06/10/2016  . PAD (peripheral artery disease) ([place]) 06/05/2016  . Chest pain 10/28/2015  . Gastroenteritis   . Diarrhea of infectious  origin   . Emesis   . S/P CABG (coronary artery bypass graft)   . Coronary artery disease involving native coronary artery of native heart without angina pectoris   . PAF (paroxysmal atrial fibrillation) ([place])   . Absolute anemia 01/28/2015  . Atherosclerosis of coronary artery 01/28/2015  . Barrett's esophagus 01/28/2015  . Basal cell carcinoma of face 01/28/2015  . Benign fibroma of prostate 01/28/2015  . CCF (congestive cardiac failure) ([place]) 01/28/2015  . Diabetes ([place]) 01/28/2015  . Gastro-esophageal reflux disease without esophagitis 01/28/2015  . Irritable colon 01/28/2015  . Cannot sleep 01/28/2015  . Pleural cavity effusion 01/28/2015  . Apnea, sleep 01/28/2015  . Hematuria 12/21/2014  . Left hand weakness 09/23/2013  . Essential hypertension  06/23/2013  . Chronic systolic heart failure ([place]) 06/10/2013  . Hypotension, postural 06/10/2013  . Hyperkalemia 06/10/2013  . Adjustment of cardiac pacemaker 06/10/2013  . Atrial fibrillation and flutter ([place]) 10/22/2012  . Pacemaker-Medtronic 06/19/2012  . Pulmonary hypertension ([place]) 03/18/2012  . Occlusion and stenosis of carotid artery without mention of cerebral infarction 02/08/2012  . Morbid obesity ([place]) 08/05/2010  . Sinoatrial node dysfunction ([facility]) 06/02/2010  . Stage 4 chronic renal impairment associated with type 2 diabetes mellitus ([place]) 08/09/2009  . AAA 04/21/2009  . Ischemic cardiomyopathy  s/p CABG 11/23/2008  . MITRAL REGURGITATION 11/22/2008  . CAD (coronary artery disease) 11/22/2008  . Hyperlipidemia 03/02/2008  . ALLERGIC RHINITIS 03/02/2008  . OSA (obstructive sleep apnea) 03/02/2008    Past Surgical History:  Procedure Laterality Date  . ABDOMINAL AORTIC ANEURYSM REPAIR      May 2008,   . CARDIAC CATHETERIZATION  2006   @ [facility]  . CATARACT EXTRACTION     right  . CATARACT EXTRACTION W/PHACO Left 10/04/2016   Procedure: CATARACT EXTRACTION PHACO AND INTRAOCULAR LENS PLACEMENT (IOC);  Surgeon: Estill Cotta, MD;  Location: [facility] ORS;  Service: Ophthalmology;  Laterality: Left;  [place] 01:21 AP% 25.9 CDE 39.08 Fluid pack lot # [id number]   . CORONARY ARTERY BYPASS GRAFT     CABG with a  LIMA to the LAD, SVG to circumflex, SVG to posterolateral [id number].  Randolm Idol / REPLACE / REMOVE PACEMAKER  2006   Dr. [name]  . MITRAL VALVE REPAIR  2006    mitral valve repair with a #28 Edwards logic ring  . PACEMAKER PLACEMENT    . PERMANENT PACEMAKER GENERATOR CHANGE N/A 06/26/2012   Procedure: PERMANENT PACEMAKER GENERATOR CHANGE;  Surgeon: Deboraha Sprang, MD;  Location: [facility] CATH LAB;  Service: Cardiovascular;  Laterality: N/A;  . SKIN BIOPSY     left wrist; right elbow  . SKIN CANCER EXCISION      x 6  . SKIN CANCER EXCISION    . TRANSESOPHAGEAL ECHOCARDIOGRAM     . VASECTOMY      Allergies Shellfish allergy  Social History Social History  Substance Use Topics  . Smoking status: Former Smoker    Packs/day: 1.50    Years: 34.00    Types: Cigarettes    Quit date: 08/28/1987  . Smokeless tobacco: Never Used  . Alcohol use 0.6 oz/week    1 Cans of beer per week     Comment: Occasionally but none since his surgery in June    Review of Systems Constitutional: Negative for fever. Eyes: Negative for vision changes ENT:  Negative for congestion, sore throat Cardiovascular: Negative for chest pain. Respiratory: Negative for shortness of breath. Gastrointestinal: Negative for abdominal pain, vomiting and diarrhea. Genitourinary: Negative  for dysuria. Musculoskeletal: Negative for back pain. Skin: Negative for rash. Neurological: Positive for generalized weakness  All systems negative/normal/unremarkable except as stated in the HPI  ____________________________________________   PHYSICAL EXAM:  VITAL SIGNS: ED Triage Vitals  Enc Vitals Group     BP      Pulse      Resp      Temp      Temp src      SpO2      Weight      Height      Head Circumference      Peak Flow      Pain Score      Pain Loc      Pain Edu?      Excl. in [place]?     Constitutional: Alert and oriented. Well appearing and in no distress. Eyes: Conjunctivae are normal. Normal extraocular movements. ENT   Head: Normocephalic and atraumatic.   Nose: No congestion/rhinnorhea.   Mouth/Throat: Mucous membranes are moist.   Neck: No stridor. Cardiovascular: Normal rate, regular rhythm. No murmurs, rubs, or gallops. Respiratory: Normal respiratory effort without tachypnea nor retractions. Mild wheezing bilaterally Gastrointestinal: Soft and nontender. Normal bowel sounds Musculoskeletal: Nontender with normal range of motion in extremities. No lower extremity tenderness nor edema. Neurologic:  Normal speech and language. No gross focal neurologic  deficits are appreciated.  Skin:  Skin is warm, dry and intact. No rash noted. Psychiatric: Mood and affect are normal. Speech and behavior are normal.  ____________________________________________  EKG: Interpreted by me. Atrial paced rhythm with a rate of 86 bpm. Left bundle branch block pattern  ____________________________________________  ED COURSE:  Pertinent labs & imaging results that were available during my care of the patient were reviewed by me and considered in my medical decision making (see chart for details). Patient presents for weakness and near syncope, we will assess with labs and imaging as indicated.   Procedures ____________________________________________   LABS (pertinent positives/negatives)  Labs Reviewed  CBC WITH DIFFERENTIAL/PLATELET - Abnormal; Notable for the following:       Result Value   RBC 4.05 (*)    Hemoglobin 12.3 (*)    HCT 37.2 (*)    All other components within normal limits  COMPREHENSIVE METABOLIC PANEL - Abnormal; Notable for the following:    Chloride 99 (*)    CO2 34 (*)    Glucose, Bld 157 (*)    BUN 57 (*)    Creatinine, Ser 2.93 (*)    Albumin 3.2 (*)    GFR calc non Af Amer 19 (*)    GFR calc Af Amer 22 (*)    All other components within normal limits  TROPONIN I  URINALYSIS, COMPLETE (UACMP) WITH MICROSCOPIC  ____________________________________________  FINAL ASSESSMENT AND PLAN  Weakness, near syncope, Chronic kidney disease  Plan: Patient's labs and imaging were dictated above. Patient had presented for worsening weakness. Patient states he's never felt this week before. He does have chronic kidney disease which appears slightly worse than his normal. Patient states he is so weak he can't hold himself up. We have started him on gentle saline infusion, he would benefit from nephrology consult and likely observation.   Earleen Newport, MD   Note: This note was generated in part or whole with voice  recognition software. Voice recognition is usually quite accurate but there are transcription errors that can and very often do occur. I apologize for any typographical errors that were not detected and  corrected.     Earleen Newport, MD 02/01/17 2235

## 2017-02-02 ENCOUNTER — Inpatient Hospital Stay: Payer: Medicare Other

## 2017-02-02 ENCOUNTER — Inpatient Hospital Stay (HOSPITAL_COMMUNITY)
Admit: 2017-02-02 | Discharge: 2017-02-02 | Disposition: A | Discharge status: Home / Self Care | Payer: Medicare Other | Attending: Family Medicine | Admitting: Family Medicine

## 2017-02-02 ENCOUNTER — Inpatient Hospital Stay: Admit: 2017-02-02 | Payer: Medicare Other

## 2017-02-02 DIAGNOSIS — N138 Other obstructive and reflux uropathy: Secondary | ICD-10-CM | POA: Diagnosis present

## 2017-02-02 DIAGNOSIS — R55 Syncope and collapse: Secondary | ICD-10-CM | POA: Diagnosis not present

## 2017-02-02 DIAGNOSIS — I5022 Chronic systolic (congestive) heart failure: Secondary | ICD-10-CM | POA: Diagnosis present

## 2017-02-02 DIAGNOSIS — D631 Anemia in chronic kidney disease: Secondary | ICD-10-CM | POA: Diagnosis present

## 2017-02-02 DIAGNOSIS — I951 Orthostatic hypotension: Secondary | ICD-10-CM | POA: Diagnosis present

## 2017-02-02 DIAGNOSIS — J449 Chronic obstructive pulmonary disease, unspecified: Secondary | ICD-10-CM | POA: Diagnosis present

## 2017-02-02 DIAGNOSIS — I2510 Atherosclerotic heart disease of native coronary artery without angina pectoris: Secondary | ICD-10-CM | POA: Diagnosis present

## 2017-02-02 DIAGNOSIS — N2581 Secondary hyperparathyroidism of renal origin: Secondary | ICD-10-CM | POA: Diagnosis present

## 2017-02-02 DIAGNOSIS — I130 Hypertensive heart and chronic kidney disease with heart failure and stage 1 through stage 4 chronic kidney disease, or unspecified chronic kidney disease: Secondary | ICD-10-CM | POA: Diagnosis present

## 2017-02-02 DIAGNOSIS — I482 Chronic atrial fibrillation: Secondary | ICD-10-CM | POA: Diagnosis present

## 2017-02-02 DIAGNOSIS — I4891 Unspecified atrial fibrillation: Secondary | ICD-10-CM | POA: Diagnosis not present

## 2017-02-02 DIAGNOSIS — E785 Hyperlipidemia, unspecified: Secondary | ICD-10-CM | POA: Diagnosis present

## 2017-02-02 DIAGNOSIS — R531 Weakness: Secondary | ICD-10-CM | POA: Diagnosis present

## 2017-02-02 DIAGNOSIS — E870 Hyperosmolality and hypernatremia: Secondary | ICD-10-CM | POA: Diagnosis present

## 2017-02-02 DIAGNOSIS — N179 Acute kidney failure, unspecified: Secondary | ICD-10-CM | POA: Diagnosis present

## 2017-02-02 DIAGNOSIS — Z961 Presence of intraocular lens: Secondary | ICD-10-CM | POA: Diagnosis present

## 2017-02-02 DIAGNOSIS — I429 Cardiomyopathy, unspecified: Secondary | ICD-10-CM | POA: Diagnosis present

## 2017-02-02 DIAGNOSIS — I495 Sick sinus syndrome: Secondary | ICD-10-CM | POA: Diagnosis not present

## 2017-02-02 DIAGNOSIS — E860 Dehydration: Secondary | ICD-10-CM | POA: Diagnosis present

## 2017-02-02 DIAGNOSIS — J9610 Chronic respiratory failure, unspecified whether with hypoxia or hypercapnia: Secondary | ICD-10-CM | POA: Diagnosis present

## 2017-02-02 DIAGNOSIS — I371 Nonrheumatic pulmonary valve insufficiency: Secondary | ICD-10-CM | POA: Diagnosis not present

## 2017-02-02 DIAGNOSIS — K219 Gastro-esophageal reflux disease without esophagitis: Secondary | ICD-10-CM | POA: Diagnosis present

## 2017-02-02 DIAGNOSIS — N189 Chronic kidney disease, unspecified: Secondary | ICD-10-CM | POA: Diagnosis not present

## 2017-02-02 DIAGNOSIS — N184 Chronic kidney disease, stage 4 (severe): Secondary | ICD-10-CM | POA: Diagnosis present

## 2017-02-02 DIAGNOSIS — N401 Enlarged prostate with lower urinary tract symptoms: Secondary | ICD-10-CM | POA: Diagnosis present

## 2017-02-02 DIAGNOSIS — L8990 Pressure ulcer of unspecified site, unspecified stage: Secondary | ICD-10-CM | POA: Insufficient documentation

## 2017-02-02 DIAGNOSIS — I447 Left bundle-branch block, unspecified: Secondary | ICD-10-CM | POA: Diagnosis present

## 2017-02-02 DIAGNOSIS — E1122 Type 2 diabetes mellitus with diabetic chronic kidney disease: Secondary | ICD-10-CM | POA: Diagnosis present

## 2017-02-02 DIAGNOSIS — N3001 Acute cystitis with hematuria: Secondary | ICD-10-CM | POA: Diagnosis present

## 2017-02-02 LAB — LIPID PANEL
Cholesterol: 114 mg/dL (ref 0–200)
HDL: 37 mg/dL — ABNORMAL LOW (ref >40)
LDL CALC: 66 mg/dL (ref 0–99)
TRIGLYCERIDES: 56 mg/dL (ref <150)
Total CHOL/HDL Ratio: 3.1 RATIO
VLDL: 11 mg/dL (ref 0–40)

## 2017-02-02 LAB — BASIC METABOLIC PANEL
Anion gap: 7 (ref 5–15)
BUN: 54 mg/dL — ABNORMAL HIGH (ref 6–20)
CALCIUM: 9.2 mg/dL (ref 8.9–10.3)
CO2: 34 mmol/L — AB (ref 22–32)
CREATININE: 2.77 mg/dL — AB (ref 0.61–1.24)
Chloride: 100 mmol/L — ABNORMAL LOW (ref 101–111)
GFR calc non Af Amer: 20 mL/min — ABNORMAL LOW (ref >60)
GFR, EST AFRICAN AMERICAN: 24 mL/min — AB (ref >60)
Glucose, Bld: 130 mg/dL — ABNORMAL HIGH (ref 65–99)
Potassium: 3.7 mmol/L (ref 3.5–5.1)
SODIUM: 141 mmol/L (ref 135–145)

## 2017-02-02 LAB — CBC
HCT: 34.2 % — ABNORMAL LOW (ref 40.0–52.0)
Hemoglobin: 11.3 g/dL — ABNORMAL LOW (ref 13.0–18.0)
MCH: 30.1 pg (ref 26.0–34.0)
MCHC: 33.1 g/dL (ref 32.0–36.0)
MCV: 91.1 fL (ref 80.0–100.0)
PLATELETS: 154 10*3/uL (ref 150–440)
RBC: 3.76 MIL/uL — AB (ref 4.40–5.90)
RDW: 14.1 % (ref 11.5–14.5)
WBC: 6 10*3/uL (ref 3.8–10.6)

## 2017-02-02 LAB — TROPONIN I
Troponin I: <0.03 ng/mL (ref <0.03)
Troponin I: <0.03 ng/mL (ref <0.03)
Troponin I: <0.03 ng/mL (ref <0.03)

## 2017-02-02 LAB — GLUCOSE, CAPILLARY
GLUCOSE-CAPILLARY: 125 mg/dL — AB (ref 65–99)
Glucose-Capillary: 127 mg/dL — ABNORMAL HIGH (ref 65–99)
Glucose-Capillary: 138 mg/dL — ABNORMAL HIGH (ref 65–99)
Glucose-Capillary: 150 mg/dL — ABNORMAL HIGH (ref 65–99)
Glucose-Capillary: 229 mg/dL — ABNORMAL HIGH (ref 65–99)

## 2017-02-02 LAB — TSH: TSH: 2.049 u[IU]/mL (ref 0.350–4.500)

## 2017-02-02 MED ORDER — ONDANSETRON HCL 4 MG/2ML IJ SOLN: 4.0000 mg | Freq: Four times a day (QID) | INTRAMUSCULAR | Status: DC | PRN

## 2017-02-02 MED ORDER — VITAMIN D 1000 UNITS PO TABS
1000.0000 [IU] | ORAL_TABLET | Freq: Every day | ORAL | Status: DC
  Administered 2017-02-02 – 2017-02-07 (×6): 1000 [IU] via ORAL
  Filled 2017-02-02 (×6): qty 1

## 2017-02-02 MED ORDER — DOCUSATE SODIUM 100 MG PO CAPS
100.0000 mg | ORAL_CAPSULE | Freq: Two times a day (BID) | ORAL | Status: DC
  Administered 2017-02-02 – 2017-02-07 (×12): 100 mg via ORAL
  Filled 2017-02-02 (×12): qty 1

## 2017-02-02 MED ORDER — ORAL CARE MOUTH RINSE
15.0000 mL | Freq: Two times a day (BID) | OROMUCOSAL | Status: DC
  Administered 2017-02-02 – 2017-02-07 (×8): 15 mL via OROMUCOSAL

## 2017-02-02 MED ORDER — LOSARTAN POTASSIUM 25 MG PO TABS
25.0000 mg | ORAL_TABLET | Freq: Every day | ORAL | Status: DC
  Administered 2017-02-02 – 2017-02-03 (×2): 25 mg via ORAL
  Filled 2017-02-02 (×3): qty 1

## 2017-02-02 MED ORDER — SENNOSIDES-DOCUSATE SODIUM 8.6-50 MG PO TABS
1.0000 | ORAL_TABLET | Freq: Every evening | ORAL | Status: DC | PRN
  Administered 2017-02-04: 1 via ORAL
  Filled 2017-02-02: qty 1

## 2017-02-02 MED ORDER — FUROSEMIDE 40 MG PO TABS
40.0000 mg | ORAL_TABLET | ORAL | Status: DC
  Administered 2017-02-02: 40 mg via ORAL
  Filled 2017-02-02: qty 1

## 2017-02-02 MED ORDER — AMIODARONE HCL 200 MG PO TABS
200.0000 mg | ORAL_TABLET | Freq: Every day | ORAL | Status: DC
  Administered 2017-02-02 – 2017-02-07 (×6): 200 mg via ORAL
  Filled 2017-02-02 (×6): qty 1

## 2017-02-02 MED ORDER — PANTOPRAZOLE SODIUM 40 MG PO TBEC
40.0000 mg | DELAYED_RELEASE_TABLET | ORAL | Status: DC
  Administered 2017-02-02 – 2017-02-07 (×3): 40 mg via ORAL
  Filled 2017-02-02 (×4): qty 1

## 2017-02-02 MED ORDER — CEFTRIAXONE SODIUM 1 G IJ SOLR
1.0000 g | INTRAMUSCULAR | Status: DC
  Administered 2017-02-02 – 2017-02-04 (×3): 1 g via INTRAVENOUS
  Filled 2017-02-02 (×4): qty 10

## 2017-02-02 MED ORDER — APIXABAN 5 MG PO TABS
5.0000 mg | ORAL_TABLET | Freq: Two times a day (BID) | ORAL | Status: DC
  Administered 2017-02-02 – 2017-02-07 (×12): 5 mg via ORAL
  Filled 2017-02-02 (×12): qty 1

## 2017-02-02 MED ORDER — ALBUTEROL SULFATE (2.5 MG/3ML) 0.083% IN NEBU
2.5000 mg | INHALATION_SOLUTION | Freq: Four times a day (QID) | RESPIRATORY_TRACT | Status: DC | PRN
  Administered 2017-02-05: 2.5 mg via RESPIRATORY_TRACT

## 2017-02-02 MED ORDER — FINASTERIDE 5 MG PO TABS
5.0000 mg | ORAL_TABLET | Freq: Every day | ORAL | Status: DC
  Administered 2017-02-02 – 2017-02-07 (×6): 5 mg via ORAL
  Filled 2017-02-02 (×6): qty 1

## 2017-02-02 MED ORDER — INSULIN ASPART 100 UNIT/ML ~~LOC~~ SOLN
0.0000 [IU] | Freq: Three times a day (TID) | SUBCUTANEOUS | Status: DC
  Administered 2017-02-02 – 2017-02-03 (×6): 3 [IU] via SUBCUTANEOUS
  Administered 2017-02-04: 4 [IU] via SUBCUTANEOUS
  Administered 2017-02-04 – 2017-02-05 (×4): 3 [IU] via SUBCUTANEOUS
  Administered 2017-02-05 – 2017-02-06 (×2): 4 [IU] via SUBCUTANEOUS
  Administered 2017-02-06 – 2017-02-07 (×2): 3 [IU] via SUBCUTANEOUS
  Administered 2017-02-07: 4 [IU] via SUBCUTANEOUS
  Filled 2017-02-02: qty 1
  Filled 2017-02-02: qty 3
  Filled 2017-02-02: qty 4
  Filled 2017-02-02 (×3): qty 3
  Filled 2017-02-02: qty 4
  Filled 2017-02-02: qty 3
  Filled 2017-02-02 (×2): qty 1
  Filled 2017-02-02: qty 4
  Filled 2017-02-02 (×4): qty 3

## 2017-02-02 MED ORDER — CALCITRIOL 0.25 MCG PO CAPS
0.5000 ug | ORAL_CAPSULE | Freq: Every day | ORAL | Status: DC
  Administered 2017-02-02 – 2017-02-07 (×6): 0.5 ug via ORAL
  Filled 2017-02-02 (×7): qty 2

## 2017-02-02 MED ORDER — OXYCODONE HCL 5 MG PO TABS: 5.0000 mg | ORAL_TABLET | ORAL | Status: DC | PRN

## 2017-02-02 MED ORDER — ACETAMINOPHEN 650 MG RE SUPP: 650.0000 mg | Freq: Four times a day (QID) | RECTAL | Status: DC | PRN

## 2017-02-02 MED ORDER — ONDANSETRON HCL 4 MG PO TABS: 4.0000 mg | ORAL_TABLET | Freq: Four times a day (QID) | ORAL | Status: DC | PRN

## 2017-02-02 MED ORDER — IPRATROPIUM BROMIDE 0.02 % IN SOLN
0.5000 mg | Freq: Four times a day (QID) | RESPIRATORY_TRACT | Status: DC | PRN
  Filled 2017-02-02: qty 2.5

## 2017-02-02 MED ORDER — ROSUVASTATIN CALCIUM 20 MG PO TABS
20.0000 mg | ORAL_TABLET | Freq: Every day | ORAL | Status: DC
  Administered 2017-02-02: 20 mg via ORAL
  Filled 2017-02-02: qty 1

## 2017-02-02 MED ORDER — SODIUM CHLORIDE 0.9 % IV SOLN
INTRAVENOUS | Status: DC
  Administered 2017-02-02 – 2017-02-03 (×3): via INTRAVENOUS

## 2017-02-02 MED ORDER — MAGNESIUM CITRATE PO SOLN
1.0000 | Freq: Once | ORAL | Status: DC | PRN
  Filled 2017-02-02: qty 296

## 2017-02-02 MED ORDER — POLYETHYLENE GLYCOL 3350 17 G PO PACK: 17.0000 g | PACK | Freq: Every day | ORAL | Status: DC | PRN

## 2017-02-02 MED ORDER — VALACYCLOVIR HCL 500 MG PO TABS
1000.0000 mg | ORAL_TABLET | Freq: Three times a day (TID) | ORAL | Status: DC
  Administered 2017-02-02 – 2017-02-03 (×4): 1000 mg via ORAL
  Filled 2017-02-02 (×4): qty 2

## 2017-02-02 MED ORDER — TRIAMCINOLONE ACETONIDE 55 MCG/ACT NA AERO
2.0000 | INHALATION_SPRAY | Freq: Every day | NASAL | Status: DC
  Administered 2017-02-02 – 2017-02-07 (×6): 2 via NASAL
  Filled 2017-02-02: qty 21.6

## 2017-02-02 MED ORDER — DEXTROSE 5 % IV SOLN: 1.0000 g | INTRAVENOUS | Status: DC

## 2017-02-02 MED ORDER — CARVEDILOL 3.125 MG PO TABS
3.1250 mg | ORAL_TABLET | Freq: Two times a day (BID) | ORAL | Status: DC
  Administered 2017-02-02 – 2017-02-03 (×3): 3.125 mg via ORAL
  Filled 2017-02-02 (×4): qty 1

## 2017-02-02 MED ORDER — MOMETASONE FUROATE 0.1 % EX CREA
1.0000 "application " | TOPICAL_CREAM | Freq: Two times a day (BID) | CUTANEOUS | Status: DC
  Administered 2017-02-02 – 2017-02-07 (×11): 1 via TOPICAL
  Filled 2017-02-02: qty 15

## 2017-02-02 MED ORDER — MULTI-VITAMIN GUMMIES PO CHEW: CHEWABLE_TABLET | Freq: Every day | ORAL | Status: DC

## 2017-02-02 MED ORDER — ROSUVASTATIN CALCIUM 10 MG PO TABS
10.0000 mg | ORAL_TABLET | Freq: Every day | ORAL | Status: DC
  Administered 2017-02-02 – 2017-02-06 (×5): 10 mg via ORAL
  Filled 2017-02-02 (×5): qty 1

## 2017-02-02 MED ORDER — ACETAMINOPHEN 325 MG PO TABS
650.0000 mg | ORAL_TABLET | Freq: Four times a day (QID) | ORAL | Status: DC | PRN
  Administered 2017-02-04: 650 mg via ORAL
  Filled 2017-02-02: qty 2

## 2017-02-02 MED ORDER — INSULIN ASPART 100 UNIT/ML ~~LOC~~ SOLN
0.0000 [IU] | Freq: Every day | SUBCUTANEOUS | Status: DC
  Administered 2017-02-02: 2 [IU] via SUBCUTANEOUS
  Filled 2017-02-02: qty 2

## 2017-02-02 MED ORDER — BISACODYL 5 MG PO TBEC: 5.0000 mg | DELAYED_RELEASE_TABLET | Freq: Every day | ORAL | Status: DC | PRN

## 2017-02-02 MED ORDER — TAMSULOSIN HCL 0.4 MG PO CAPS
0.4000 mg | ORAL_CAPSULE | Freq: Every day | ORAL | Status: DC
  Administered 2017-02-02: 0.4 mg via ORAL
  Filled 2017-02-02 (×2): qty 1

## 2017-02-02 MED ORDER — ADULT MULTIVITAMIN W/MINERALS CH
1.0000 | ORAL_TABLET | Freq: Every day | ORAL | Status: DC
  Administered 2017-02-02 – 2017-02-07 (×6): 1 via ORAL
  Filled 2017-02-02 (×6): qty 1

## 2017-02-02 NOTE — Progress Notes (Signed)
[place] [facility] [place] NAME: Marco [name]    MR#:  [id number]  DATE OF BIRTH:  [birth year]  SUBJECTIVE:   Patient here due to presyncope and noted to be orthostatic. Also noted to have mild acute on chronic renal failure with creatinine back to baseline today. Also noted to have urinary tract infection.  REVIEW OF SYSTEMS:    Review of Systems  Constitutional: Negative for chills and fever.  HENT: Negative for congestion and tinnitus.   Eyes: Negative for blurred vision and double vision.  Respiratory: Negative for cough, shortness of breath and wheezing.   Cardiovascular: Negative for chest pain, orthopnea and PND.  Gastrointestinal: Negative for abdominal pain, diarrhea, nausea and vomiting.  Genitourinary: Negative for dysuria and hematuria.  Neurological: Positive for weakness. Negative for dizziness, sensory change and focal weakness.  All other systems reviewed and are negative.   Nutrition: Heart healthy/Carb modified Tolerating Diet: yes Tolerating PT: Await Eval.    DRUG ALLERGIES:   Allergies  Allergen Reactions  . Shellfish Allergy Anaphylaxis    VITALS:  Blood pressure (!) 156/66, pulse 72, temperature 99.3 F (37.4 C), temperature source Oral, resp. rate (!) 24, height 6' (1.829 m), weight 113.4 kg (250 lb), SpO2 97 %.  PHYSICAL EXAMINATION:   Physical Exam  GENERAL:  81 y.o.-year-old obese patient lying in bed in no acute distress.  EYES: Pupils equal, round, reactive to light and accommodation. No scleral icterus. Extraocular muscles intact.  HEENT: Head atraumatic, normocephalic. Oropharynx and nasopharynx clear.  NECK:  Supple, no jugular venous distention. No thyroid enlargement, no tenderness.  LUNGS: Normal breath sounds bilaterally, no wheezing, rales, rhonchi. No use of accessory muscles of respiration.  CARDIOVASCULAR: S1, S2 normal. No murmurs, rubs, or gallops.  ABDOMEN: Soft, nontender, nondistended.  Bowel sounds present. No organomegaly or mass.  EXTREMITIES: No cyanosis, clubbing or edema b/l.    NEUROLOGIC: Cranial nerves II through XII are intact. No focal Motor or sensory deficits b/l. Globally weak.  PSYCHIATRIC: The patient is alert and oriented x 3.  SKIN: No obvious rash, lesion, or ulcer.    LABORATORY PANEL:   CBC  Recent Labs Lab 02/02/17 0201  WBC 6.0  HGB 11.3*  HCT 34.2*  PLT 154   ------------------------------------------------------------------------------------------------------------------  Chemistries   Recent Labs Lab 02/01/17 2122 02/02/17 0201  NA 142 141  K 3.5 3.7  CL 99* 100*  CO2 34* 34*  GLUCOSE 157* 130*  BUN 57* 54*  CREATININE 2.93* 2.77*  CALCIUM 9.6 9.2  AST 18  --   ALT 18  --   ALKPHOS 55  --   BILITOT 0.6  --    ------------------------------------------------------------------------------------------------------------------  Cardiac Enzymes  Recent Labs Lab 02/02/17 0751  TROPONINI <0.03   ------------------------------------------------------------------------------------------------------------------  RADIOLOGY:  Dg Chest 1 View  Result Date: 02/01/2017 CLINICAL DATA:  Weakness and dizziness. EXAM: CHEST 1 VIEW COMPARISON:  October 28, 2015 FINDINGS: The left-sided effusion with underlying opacity is again identified and not significantly changed. The cardiomediastinal silhouette is stable. Haziness over the right base likely represent is effusion and opacity, also essentially stable in the interval. No acute interval changes. IMPRESSION: Bilateral pleural effusions and underlying atelectasis, not significantly changed since previous studies. Electronically Signed   By: Dorise Bullion III M.D   On: 02/01/2017 21:42   US Carotid Bilateral  Result Date: 02/02/2017 CLINICAL DATA:  Carotid stent EXAM: BILATERAL CAROTID DUPLEX ULTRASOUND TECHNIQUE: Pearline Cables scale imaging, color Doppler and duplex ultrasound  were performed of  bilateral carotid and vertebral arteries in the neck. COMPARISON:  None. FINDINGS: Criteria: Quantification of carotid stenosis is based on velocity parameters that correlate the residual internal carotid diameter with NASCET-based stenosis levels, using the diameter of the distal internal carotid lumen as the denominator for stenosis measurement. The following velocity measurements were obtained: RIGHT ICA:  172 cm/sec CCA:  89 cm/sec SYSTOLIC ICA/CCA RATIO:  1.9 DIASTOLIC ICA/CCA RATIO:  3.0 ECA:  83 cm/sec LEFT ICA:  248 cm/sec CCA:  694 cm/sec SYSTOLIC ICA/CCA RATIO:  2.3 DIASTOLIC ICA/CCA RATIO:  3.3 ECA:  195 cm/sec RIGHT CAROTID ARTERY: Mild scattered calcified plaque in the mid and upper common carotid artery. No significant plaque in the bulb. Low resistance internal carotid Doppler pattern. There is scattered plaque in the mid internal carotid artery. RIGHT VERTEBRAL ARTERY:  Antegrade. LEFT CAROTID ARTERY: There is significant irregular plaque in the bulb which shadows the lumen of the internal carotid artery. Low resistance internal carotid Doppler pattern is preserved. LEFT VERTEBRAL ARTERY:  Antegrade. IMPRESSION: Less than 50% stenosis in the right internal carotid artery. Greater than 70% stenosis in the left internal carotid artery. Electronically Signed   By: Marybelle Killings M.D.   On: 02/02/2017 10:53     ASSESSMENT AND PLAN:   81 year old male past medical history of chronic systolic CHF, chronic kidney disease stage IV, previous history of DVT, hypertension, hyperlipidemia, obesity, status post pacemaker, diabetes, history of coronary artery disease who presents to the hospital due to a syncopal episode.  1. Syncope/Generalized weakness-most likely related to orthostasis. Patient's carotid duplex studies show 70% stenosis and left carotid although that's unlikely to cause of the patient's syncope. Continue gentle IV fluid hydration, follow orthostatics. -Await echocardiogram results. No  alarms on telemetry. -Troponins 3 have been negative.  2. Acute on chronic renal failure-patient baseline creatinine around 2.5-2.7. Patient's creatinine today is back to baseline with some IV fluid hydration. Appreciate nephrology input and no acute further intervention presently.  3. Urinary tract infection-continue IV Rocephin, follow urine cultures.  4. BPH-no evidence of urinary retention. Continue Proscar, Flomax.  5. Hyperlipidemia-continue Crestor.  6. History of chronic atrial fibrillation-rate controlled. Continue Coreg, amiodarone. - cont. Eliquis  7. GERD-continue omeprazole.  8. Essential hypertension-continue Cozaar, Coreg, Lasix  9. DM - cont. SSI and follow BS.  -Await physical therapy evaluation.     All the records are reviewed and case discussed with Care Management/Social Worker. Management plans discussed with the patient, family and they are in agreement.  CODE STATUS: Full code  DVT Prophylaxis: Eliquis  TOTAL TIME TAKING CARE OF THIS PATIENT: 30 minutes.   POSSIBLE D/C IN 1-2 DAYS, DEPENDING ON CLINICAL CONDITION.   Henreitta Leber M.D on 02/02/2017 at 2:11 PM  Between 7am to 6pm - Pager - [phone]  After 6pm go to [email or web address] - password [email or web address] [facility]  Office  [phone]  CC: Primary care physician; [name], MD

## 2017-02-02 NOTE — Care Management Important Message (Signed)
Important Message  Patient Details  Name: Marco Mccorkel Sr. MRN: [id number] Date of Birth: June 03, 1936   Medicare Important Message Given:  Yes Signed IM notice given    Katrina Stack, RN 02/02/2017, 8:56 AM

## 2017-02-02 NOTE — H&P (Signed)
History and Physical   [facility] - [place] @ [facility] Admission History and Physical [employer], D.O.    Patient Name: Marco [name] MR#: [id number] Date of Birth: [birth year] Date of Admission: 02/01/2017  Referring MD/NP/PA: Dr. Jimmye Norman Primary Care Physician: [name], MD Patient coming from: Home Outpatient Specialists:  Dr. [name] (cardi), Dr. Louis Meckel (Urology)  Chief Complaint:  Chief Complaint  Patient presents with  . Fatigue    HPI: Marco [name] is a 81 y.o. male with a known history of Diabetes, CKD, BPH, hypertension, hyperlipidemia, atrial fibrillation, GERD presents to the emergency department for evaluation of Syncope.  Patient was in a usual state of health until this afternoon he was using the bathroom, tending to urinate when he developed lightheadedness, dizziness and states that he blacked out momentarily. He did not fall, no head trauma, no loss of consciousness.[name] He reports generalized weakness for the last few days. He has seen nephrology in the past.  Patient denies fevers/chills, chest pain, shortness of breath, N/V/C/D, abdominal pain, dysuria/frequency, changes in mental status.    Otherwise there has been no change in status. Patient has been taking medication as prescribed and there has been no recent change in medication or diet.  No recent antibiotics.  There has been no recent illness, hospitalizations, travel or sick contacts.    EMS/ED Course: Patient received Rocephin, NS.  Review of Systems:  CONSTITUTIONAL: No fever/chills, fatigue, weakness, weight gain/loss, headache. EYES: No blurry or double vision. ENT: No tinnitus, postnasal drip, redness or soreness of the oropharynx. RESPIRATORY: No cough, dyspnea, wheeze.  No hemoptysis.  CARDIOVASCULAR: No chest pain, palpitations, syncope, orthopnea. No lower extremity edema.  GASTROINTESTINAL: No nausea, vomiting, abdominal pain, diarrhea, constipation.  No hematemesis,  melena or hematochezia. GENITOURINARY: No dysuria, frequency, hematuria. ENDOCRINE: No polyuria or nocturia. No heat or cold intolerance. HEMATOLOGY: No anemia, bruising, bleeding. INTEGUMENTARY: No rashes, ulcers, lesions. MUSCULOSKELETAL: No arthritis, gout, dyspnea. NEUROLOGIC: No numbness, tingling, ataxia, seizure-type activity, weakness. PSYCHIATRIC: No anxiety, depression, insomnia.   Past Medical History:  Diagnosis Date  . AAA (abdominal aortic aneurysm)repaired    a. 12/2006 s/p repair of 6cm infrarenal AAA with 51mm Dacron graft.  . Anemia   . Anginal pain ([place])   . Atrial fibrillation/Flutter    a. Dx 2014;  b. 10/2012 s/p TEE/DCCV;  c. On amio and Eliquis (CHA2DS2VASc = 6).  . Basal cell carcinoma    a. 2013 x 2 back and left neck  . BPH (benign prostatic hypertrophy)   . Carotid artery occlusion    a. 11/2008 s/p R CEA.  . Chronic systolic heart failure ([place])    a. 02/2012 Echo: EF 30-35%, Pulm HTN, and modest RV dysfunction;  b. 10/2012 TEE: EF 25-30%, mild conc LVH, diff HK, mod RV dysfxn, mod dil RA/LA, mild MR, no MS, nl MVR, mod TR, Ao sclerosis.  . CKD (chronic kidney disease), stage IV ([place])   . Coronary artery disease    a. 1999 PCI RCA;  b. 01/2005 NSTEMI: CABG x 3 (LIMA->LAD, VG->OM, VG->LPL) @ time of MVR.  [name] DVT (deep venous thrombosis) ([place])   . Dysrhythmia   . GERD (gastroesophageal reflux disease)   . History of hiatal hernia   . History of shingles   . Hyperlipidemia   . Hypertension   . Hypertensive heart disease   . Mitral regurgitation--s/p repair    a. 01/2005 MVR: 28-mm Edwards ET Logix ring annuloplasty; b. 10/2012 TEE: normal fxning MV ring  with mild MR, no MS.  . Mixed Ischemic and Non-ischemic Cardiomyopathy    a. 02/2012 Echo: EF 30-35%, Pulm HTN, and modest RV dysfunction;  b. 10/2012 TEE: EF 25-30%, mild conc LVH, diff HK, mod RV dysfxn, mod dil RA/LA, mild MR, no MS, nl MVR, mod TR, Ao sclerosis.  . Myocardial infarction ([place])    2006  .  Obesity   . Orthopnea   . Oxygen deficit    HS  . Presence of permanent cardiac pacemaker   . PVC (premature ventricular contraction)    a. 02/2009 s/p RFCA ([place]).  . Sick sinus syndrome ([place])    a. 01/2005 s/p PPM 2/2 heart block following CABG;  b. 05/2012 Gen Change: MDT Adapta ADDRL1 DC PPM, ser # [id number] H.  . Sleep apnea    a. Intolerant of CPAP  . Type II diabetes mellitus ([place])     Past Surgical History:  Procedure Laterality Date  . ABDOMINAL AORTIC ANEURYSM REPAIR      May 2008,   . CARDIAC CATHETERIZATION  2006   @ [facility]  . CATARACT EXTRACTION     right  . CATARACT EXTRACTION W/PHACO Left 10/04/2016   Procedure: CATARACT EXTRACTION PHACO AND INTRAOCULAR LENS PLACEMENT (IOC);  Surgeon: Estill Cotta, MD;  Location: [facility] ORS;  Service: Ophthalmology;  Laterality: Left;  [place] 01:21 AP% 25.9 CDE 39.08 Fluid pack lot # [id number]   . CORONARY ARTERY BYPASS GRAFT     CABG with a  LIMA to the LAD, SVG to circumflex, SVG to posterolateral [id number].  Randolm Idol / REPLACE / REMOVE PACEMAKER  2006   Dr. [name]  . MITRAL VALVE REPAIR  2006    mitral valve repair with a #28 Edwards logic ring  . PACEMAKER PLACEMENT    . PERMANENT PACEMAKER GENERATOR CHANGE N/A 06/26/2012   Procedure: PERMANENT PACEMAKER GENERATOR CHANGE;  Surgeon: Deboraha Sprang, MD;  Location: [facility] CATH LAB;  Service: Cardiovascular;  Laterality: N/A;  . SKIN BIOPSY     left wrist; right elbow  . SKIN CANCER EXCISION      x 6  . SKIN CANCER EXCISION    . TRANSESOPHAGEAL ECHOCARDIOGRAM    . VASECTOMY       reports that he quit smoking about 29 years ago. His smoking use included Cigarettes. He has a 51.00 pack-year smoking history. He has never used smokeless tobacco. He reports that he drinks about 0.6 oz of alcohol per week . He reports that he does not use drugs.  Allergies  Allergen Reactions  . Shellfish Allergy Anaphylaxis    Family History  Problem Relation Age of Onset  . Heart attack Mother  79  . Diabetes Sister   . Hypertension Sister   . Heart attack Sister   . Heart attack Brother 52       3 Brothers deceased from [place] in 67's or 21's  . Heart disease Brother        Heart Disease before age 14  . Heart attack Father 26  . Diabetes Father        Amputation: bilateral feet  . Heart disease Father   . Heart attack Sister   . Heart attack Sister   . Heart attack Brother 37  . Heart attack Brother 90  . Diabetes Son     Prior to Admission medications   Medication Sig Start Date End Date Taking? Authorizing Provider  ACCU-CHEK FASTCLIX LANCETS MISC TEST twice a day if needed 07/04/16  Yes  [name], MD  ACCU-CHEK SMARTVIEW test strip  11/02/15  Yes [provider]  ACCU-CHEK SMARTVIEW test strip TEST twice a day 07/05/16  Yes [name], MD  amiodarone (PACERONE) 200 MG tablet take 1 tablet by mouth once daily 01/08/17  Yes Gollan, Kathlene November, MD  BD INSULIN SYRINGE ULTRAFINE 31G X 5/16" 0.3 ML MISC  02/12/16  Yes [provider]  BD INSULIN SYRINGE ULTRAFINE 31G X 5/16" 0.3 ML MISC use twice a day as directed 04/03/16  Yes [name], MD  calcitRIOL (ROCALTROL) 0.5 MCG capsule take 1 capsule by mouth once daily 07/04/16  Yes [name], MD  carvedilol (COREG) 3.125 MG tablet Take 1 tablet (3.125 mg total) by mouth 2 (two) times daily with a meal. 06/05/16  Yes Gollan, Kathlene November, MD  cholecalciferol (VITAMIN D) 1000 UNITS tablet Take 1,000 Units by mouth daily.   Yes [provider]  docusate sodium (COLACE) 100 MG capsule Take 100 mg by mouth 2 (two) times daily.     Yes [provider]  ELIQUIS 5 MG TABS tablet take 1 tablet by mouth twice a day 07/31/16  Yes [name], MD  finasteride (PROSCAR) 5 MG tablet Take 1 tablet (5 mg total) by mouth daily. 01/30/17  Yes Ardis Hughs, MD  furosemide (LASIX) 40 MG tablet Take 1 tablet (40 mg total) by mouth every other day. 05/03/16  Yes  Minna Merritts, MD  HUMALOG MIX 75/25 (75-25) 100 UNIT/ML SUSP injection inject 5 to 10 units subcutaneously twice a day 08/14/16  Yes [name], MD  losartan (COZAAR) 25 MG tablet Take 25 mg by mouth daily.   Yes [provider]  mometasone (ELOCON) 0.1 % ointment Apply 1 application topically 2 (two) times daily. 01/31/17  Yes [provider]  Multiple Vitamins-Minerals (MULTI-VITAMIN GUMMIES) CHEW Chew by mouth daily.   Yes [provider]  omeprazole (PRILOSEC) 20 MG capsule Take 20 mg by mouth daily. Monday, Wednesday, friday   Yes [provider]  OXYGEN Inhale 1.5 L into the lungs.   Yes [provider]  polyethylene glycol (MIRALAX / GLYCOLAX) packet Take 17 g by mouth daily as needed for mild constipation. 06/12/16  Yes Fritzi Mandes, MD  rosuvastatin (CRESTOR) 20 MG tablet take 1 tablet by mouth at bedtime 10/02/16  Yes Minna Merritts, MD  tamsulosin [facility]) 0.4 MG CAPS capsule take 1 capsule by mouth once daily 01/01/17  Yes [name], MD  triamcinolone (NASACORT ALLERGY 24HR) 55 MCG/ACT AERO nasal inhaler Place 2 sprays into the nose daily.   Yes [provider]  valACYclovir (VALTREX) 1000 MG tablet Take 1 tablet by mouth 3 (three) times daily. 01/31/17  Yes [provider]    Physical Exam: Vitals:   02/01/17 2229 02/01/17 2230 02/01/17 2231 02/01/17 2310  BP:  (!) 143/80  130/63  Pulse: 77 77 77 75  Resp: 17 20 14 18   Temp:      TempSrc:      SpO2: 99% 100% 100% 100%  Weight:      Height:        GENERAL: 81 y.o.-year-old male patient, well-developed, well-nourished lying in the bed in no acute distress.  Pleasant and cooperative.   HEENT: Head atraumatic, normocephalic. Pupils equal, round, reactive to light and accommodation. No scleral icterus. Extraocular muscles intact. Nares are patent. Oropharynx is clear. Mucus membranes moist. NECK:  Supple, full range of motion. No JVD, no bruit  heard. No thyroid enlargement, no tenderness, no cervical lymphadenopathy. CHEST: Normal breath sounds bilaterally. No wheezing, rales, rhonchi or crackles. No use of accessory muscles of respiration.  No reproducible chest wall tenderness.  CARDIOVASCULAR: S1, S2 normal. No murmurs, rubs, or gallops. Cap refill <2 seconds. Pulses intact distally.  ABDOMEN: Soft, nondistended, nontender. No rebound, guarding, rigidity. Normoactive bowel sounds present in all four quadrants. No organomegaly or mass. EXTREMITIES: No pedal edema, cyanosis, or clubbing. No calf tenderness or Homan's sign.  NEUROLOGIC: The patient is alert and oriented x 3. Cranial nerves II through XII are grossly intact with no focal sensorimotor deficit. Muscle strength 5/5 in all extremities. Sensation intact. Gait not checked. PSYCHIATRIC:  Normal affect, mood, thought content. SKIN: Warm, dry, and intact without obvious rash, lesion, or ulcer.    Labs on Admission:  CBC:  Recent Labs Lab 02/01/17 2122  WBC 7.1  NEUTROABS 4.7  HGB 12.3*  HCT 37.2*  MCV 91.8  PLT 462   Basic Metabolic Panel:  Recent Labs Lab 02/01/17 2122  NA 142  K 3.5  CL 99*  CO2 34*  GLUCOSE 157*  BUN 57*  CREATININE 2.93*  CALCIUM 9.6   GFR: Estimated Creatinine Clearance: 27 mL/min (A) (by C-G formula based on SCr of 2.93 mg/dL (H)). Liver Function Tests:  Recent Labs Lab 02/01/17 2122  AST 18  ALT 18  ALKPHOS 55  BILITOT 0.6  PROT 7.0  ALBUMIN 3.2*   No results for input(s): LIPASE, AMYLASE in the last 168 hours. No results for input(s): AMMONIA in the last 168 hours. Coagulation Profile: No results for input(s): INR, PROTIME in the last 168 hours. Cardiac Enzymes:  Recent Labs Lab 02/01/17 2122  TROPONINI <0.03   BNP (last 3 results) No results for input(s): PROBNP in the last 8760 hours. HbA1C: No results for input(s): HGBA1C in the last 72 hours. CBG: No results for input(s): GLUCAP in the last 168  hours. Lipid Profile: No results for input(s): CHOL, HDL, LDLCALC, TRIG, CHOLHDL, LDLDIRECT in the last 72 hours. Thyroid Function Tests: No results for input(s): TSH, T4TOTAL, FREET4, T3FREE, THYROIDAB in the last 72 hours. Anemia Panel: No results for input(s): VITAMINB12, FOLATE, FERRITIN, TIBC, IRON, RETICCTPCT in the last 72 hours. Urine analysis:    Component Value Date/Time   COLORURINE YELLOW (A) 02/01/2017 2211   APPEARANCEUR HAZY (A) 02/01/2017 2211   APPEARANCEUR Clear 03/13/2014 1317   LABSPEC 1.012 02/01/2017 2211   LABSPEC 1.014 03/13/2014 1317   PHURINE 6.0 02/01/2017 2211   GLUCOSEU 150 (A) 02/01/2017 2211   GLUCOSEU 50 mg/dL 03/13/2014 1317   HGBUR LARGE (A) 02/01/2017 2211   BILIRUBINUR NEGATIVE 02/01/2017 2211   BILIRUBINUR Negative 03/13/2014 1317   KETONESUR NEGATIVE 02/01/2017 2211   PROTEINUR 30 (A) 02/01/2017 2211   UROBILINOGEN 1.0 12/11/2008 1041   NITRITE NEGATIVE 02/01/2017 2211   LEUKOCYTESUR MODERATE (A) 02/01/2017 2211   LEUKOCYTESUR Negative 03/13/2014 1317   Sepsis Labs: @LABRCNTIP (procalcitonin:4,lacticidven:4) )No results found for this or any previous visit (from the past 240 hour(s)).   Radiological Exams on Admission: Dg Chest 1 View  Result Date: 02/01/2017 CLINICAL DATA:  Weakness and dizziness. EXAM: CHEST 1 VIEW COMPARISON:  October 28, 2015 FINDINGS: The left-sided effusion with underlying opacity is again identified and not significantly changed. The cardiomediastinal silhouette is stable. Haziness over the right base likely represent is effusion and opacity, also essentially stable in the interval. No acute interval  changes. IMPRESSION: Bilateral pleural effusions and underlying atelectasis, not significantly changed since previous studies. Electronically Signed   By: Dorise Bullion III M.D   On: 02/01/2017 21:42    EKG: Atrial paced at 86 bpm with normal axis, LBBB and nonspecific ST-T wave changes.   Assessment/Plan  This is a 81  y.o. male with a history of Diabetes, CKD, BPH, hypertension, hyperlipidemia, atrial fibrillation, GERD now being admitted with:  #. Near syncope / generalized weakness - Admit observation with telemetry monitoring - IV fluid hydration - Check orthostatics - Check echo, carotids - Trend trops, check TSH, lipids - Consider cardio consult  #. Acute kidney injury on CKD - IV fluids and repeat BMP in AM.  - Avoid nephrotoxic medications - Bladder scan and place foley catheter if evidence of urinary retention - Nephrology consultation requested  #. UTI - IV Rocephin - Follow up cultures  #. History of BPH - Continue Proscar, Flomax  #. History of hyperlipidemia - Continue Crestor  #. History of atrial fibrillation - Continue amiodarone, Eliquis, Coreg  #. History of hypertension - Continue Cozaar, Coreg, Lasix  #. History of GERD - Continue Prilosec  #. H/o Diabetes - Accuchecks achs with RISS coverage - Heart healthy, carb controlled diet  Admission status: Inpatient IV Fluids: Normal saline Diet/Nutrition: Heart healthy, carb controlled Consults called: Nephrology  DVT Px: Eliquis SCDs and early ambulation. Code Status: Full Code  Disposition Plan: To home in 2-3 days  All the records are reviewed and case discussed with ED provider. Management plans discussed with the patient and/or family who express understanding and agree with plan of care.  Jadalynn Burr D.O. on 02/02/2017 at 12:03 AM Between 7am to 6pm - Pager - [phone] After 6pm go to [email or web address] - [employer] [facility]  Hospitalists Office [phone] CC: Primary care physician; [name], MD   02/02/2017, 12:03 AM

## 2017-02-02 NOTE — Progress Notes (Signed)
Pharmacy Antibiotic Note  Manraj Yeo. is a 81 y.o. male admitted on 02/01/2017 with UTI.  Pharmacy has been consulted for ceftriaxone dosing.  Plan: Will continue ceftriaxone 1g IV daily  Height: 6' (182.9 cm) Weight: 250 lb (113.4 kg) IBW/kg (Calculated) : 77.6  Temp (24hrs), Avg:98.2 F (36.8 C), Min:98 F (36.7 C), Max:98.3 F (36.8 C)   Recent Labs Lab 02/01/17 2122  WBC 7.1  CREATININE 2.93*    Estimated Creatinine Clearance: 27 mL/min (A) (by C-G formula based on SCr of 2.93 mg/dL (H)).    Allergies  Allergen Reactions  . Shellfish Allergy Anaphylaxis    Thank you for allowing pharmacy to be a part of this patient's care.  Tobie Lords, PharmD, BCPS Clinical Pharmacist 02/02/2017

## 2017-02-02 NOTE — Progress Notes (Signed)
[facility]  ROUNDING NOTE   Subjective:  Patient well known to [place] from the office. We follow him for underlying chronic kidney disease stage IV. He was last seen in our office in March. At that point in time his creatinine was 2.7 with an EGFR 22. The patient has had intermittent periods of hematuria at home. Yesterday he was lightheaded, dizzy and felt presyncopal.   Objective:  Vital signs in last 24 hours:  Temp:  [98 F (36.7 C)-99.3 F (37.4 C)] 99.3 F (37.4 C) (06/08 1225) Pulse Rate:  [62-81] 72 (06/08 1225) Resp:  [14-28] 24 (06/08 1225) BP: (94-156)/(43-82) 156/66 (06/08 1225) SpO2:  [94 %-100 %] 97 % (06/08 0836) Weight:  [113.4 kg (250 lb)] 113.4 kg (250 lb) (06/07 2124)  Weight change:  Filed Weights   02/01/17 2124  Weight: 113.4 kg (250 lb)    Intake/Output: I/O last 3 completed shifts: In: 191.3 [I.V.:141.3; IV Piggyback:50] Out: -    Intake/Output this shift:  Total I/O In: 120 [P.O.:120] Out: -   Physical Exam: General: No acute distress  Head: Normocephalic, atraumatic. Moist oral mucosal membranes  Eyes: Anicteric  Neck: Supple, trachea midline  Lungs:  Clear to auscultation, normal effort  Heart: S1S2 no rubs  Abdomen:  Soft, nontender, bowel sounds present  Extremities: Trace peripheral edema.  Neurologic: Awake, alert, following commands  Skin: No lesions       Basic Metabolic Panel:  Recent Labs Lab 02/01/17 2122 02/02/17 0201  NA 142 141  K 3.5 3.7  CL 99* 100*  CO2 34* 34*  GLUCOSE 157* 130*  BUN 57* 54*  CREATININE 2.93* 2.77*  CALCIUM 9.6 9.2    Liver Function Tests:  Recent Labs Lab 02/01/17 2122  AST 18  ALT 18  ALKPHOS 55  BILITOT 0.6  PROT 7.0  ALBUMIN 3.2*   No results for input(s): LIPASE, AMYLASE in the last 168 hours. No results for input(s): AMMONIA in the last 168 hours.  CBC:  Recent Labs Lab 02/01/17 2122 02/02/17 0201  WBC 7.1 6.0  NEUTROABS 4.7  --   HGB 12.3* 11.3*   HCT 37.2* 34.2*  MCV 91.8 91.1  PLT 152 154    Cardiac Enzymes:  Recent Labs Lab 02/01/17 2122 02/02/17 0201 02/02/17 0751  TROPONINI <0.03 <0.03 <0.03    BNP: Invalid input(s): POCBNP  CBG:  Recent Labs Lab 02/02/17 0236 02/02/17 0742 02/02/17 1138  GLUCAP 125* 127* 138*    Microbiology: Results for orders placed or performed during the hospital encounter of 05/03/16  Urine culture     Status: Abnormal   Collection Time: 05/03/16 12:00 PM  Result Value Ref Range Status   Specimen Description URINE, RANDOM  Final   Special Requests NONE  Final   Culture MULTIPLE SPECIES PRESENT, SUGGEST RECOLLECTION (A)  Final   Report Status 05/04/2016 FINAL  Final    Coagulation Studies: No results for input(s): LABPROT, INR in the last 72 hours.  Urinalysis:  Recent Labs  02/01/17 2211  COLORURINE YELLOW*  LABSPEC 1.012  PHURINE 6.0  GLUCOSEU 150*  HGBUR LARGE*  BILIRUBINUR NEGATIVE  KETONESUR NEGATIVE  PROTEINUR 30*  NITRITE NEGATIVE  LEUKOCYTESUR MODERATE*      Imaging: Dg Chest 1 View  Result Date: 02/01/2017 CLINICAL DATA:  Weakness and dizziness. EXAM: CHEST 1 VIEW COMPARISON:  October 28, 2015 FINDINGS: The left-sided effusion with underlying opacity is again identified and not significantly changed. The cardiomediastinal silhouette is stable. Haziness over the right  base likely represent is effusion and opacity, also essentially stable in the interval. No acute interval changes. IMPRESSION: Bilateral pleural effusions and underlying atelectasis, not significantly changed since previous studies. Electronically Signed   By: Dorise Bullion III M.D   On: 02/01/2017 21:42   US Carotid Bilateral  Result Date: 02/02/2017 CLINICAL DATA:  Carotid stent EXAM: BILATERAL CAROTID DUPLEX ULTRASOUND TECHNIQUE: Pearline Cables scale imaging, color Doppler and duplex ultrasound were performed of bilateral carotid and vertebral arteries in the neck. COMPARISON:  None. FINDINGS: Criteria:  Quantification of carotid stenosis is based on velocity parameters that correlate the residual internal carotid diameter with NASCET-based stenosis levels, using the diameter of the distal internal carotid lumen as the denominator for stenosis measurement. The following velocity measurements were obtained: RIGHT ICA:  172 cm/sec CCA:  89 cm/sec SYSTOLIC ICA/CCA RATIO:  1.9 DIASTOLIC ICA/CCA RATIO:  3.0 ECA:  83 cm/sec LEFT ICA:  248 cm/sec CCA:  982 cm/sec SYSTOLIC ICA/CCA RATIO:  2.3 DIASTOLIC ICA/CCA RATIO:  3.3 ECA:  195 cm/sec RIGHT CAROTID ARTERY: Mild scattered calcified plaque in the mid and upper common carotid artery. No significant plaque in the bulb. Low resistance internal carotid Doppler pattern. There is scattered plaque in the mid internal carotid artery. RIGHT VERTEBRAL ARTERY:  Antegrade. LEFT CAROTID ARTERY: There is significant irregular plaque in the bulb which shadows the lumen of the internal carotid artery. Low resistance internal carotid Doppler pattern is preserved. LEFT VERTEBRAL ARTERY:  Antegrade. IMPRESSION: Less than 50% stenosis in the right internal carotid artery. Greater than 70% stenosis in the left internal carotid artery. Electronically Signed   By: Marybelle Killings M.D.   On: 02/02/2017 10:53     Medications:   . sodium chloride 75 mL/hr at 02/02/17 0307  . cefTRIAXone (ROCEPHIN) IVPB 1 gram/50 mL D5W     . amiodarone  200 mg Oral Daily  . apixaban  5 mg Oral BID  . calcitRIOL  0.5 mcg Oral Daily  . carvedilol  3.125 mg Oral BID WC  . cholecalciferol  1,000 Units Oral Daily  . docusate sodium  100 mg Oral BID  . finasteride  5 mg Oral Daily  . furosemide  40 mg Oral QODAY  . insulin aspart  0-20 Units Subcutaneous TID WC  . insulin aspart  0-5 Units Subcutaneous QHS  . losartan  25 mg Oral Daily  . mouth rinse  15 mL Mouth Rinse BID  . mometasone  1 application Topical BID  . multivitamin with minerals  1 tablet Oral Daily  . pantoprazole  40 mg Oral Q M,W,F   . rosuvastatin  20 mg Oral QHS  . tamsulosin  0.4 mg Oral Daily  . triamcinolone  2 spray Nasal Daily  . valACYclovir  1,000 mg Oral TID   acetaminophen **OR** acetaminophen, albuterol, bisacodyl, ipratropium, magnesium citrate, ondansetron **OR** ondansetron (ZOFRAN) IV, oxyCODONE, polyethylene glycol, senna-docusate  Assessment/ Plan:  81 y.o. male with hypertension, diabetes mellitus, hyperlipidemia, chronic diastolic heart failure, chronic kidney disease stage IV, coronary disease status post CABG in 2006, abdominal aortic aneurysm repair, and permanent pacemaker placement admitted for presynocpe.  Has also had intermittent gross hematuria.    1. Chronic kidney disease stage IV/diabetes mellitus type 2 with chronic kidney disease/proteinuria. Renal function at the moment appears to be relatively stable from the office.  Continue IV fluid hydration for now as the patient was presyncopal prior to his arrival here.   2. Hypertension. Continue carvedilol and losartan.   3. Secondary  hyperparathyroidism. Continue calcitriol 0.5 g by mouth daily. Periodically monitor PTH, phosphorus, calcium.   4. Anemia chronic and disease.  Hemoglobin currently 11.3. No indication for Procrit.  5. Intermittent gross hematuria. The patient has had a cystoscopy this past year. Given the fact that his hematuria has been persistent we will obtain CT scan of the abdomen and pelvis without contrast for further evaluation.  6.  Presyncope:  As per hospitalist. Follow with Dr. Rockey Situ as outpt.     LOS: 0 Jaquay Posthumus 6/8/20181:28 PM

## 2017-02-02 NOTE — Progress Notes (Signed)
Patient admitted for syncope and light-headedness. Patient takes coreg 3.125 mg bid and lasix 40 mg at home.  Called MD to recommend holding 2 doses of coreg and one dose of lasix. BP in the 120's HR in 42 - 57's MD agrees with plan.  Tobie Lords, PharmD, BCPS Clinical Pharmacist 02/02/2017

## 2017-02-02 NOTE — Care Management (Signed)
Admitted with near syncope. Acute on chronic kidney failure.  Appears baseline creatinine around 2.74 and BUN 40 - 51. Neprohology consult. Echo and carotid ultrasound pending.  IVF.  Independent in all adls, denies issues accessing medical care, obtaining medications or with transportation.  Current with her PCP.

## 2017-02-02 NOTE — Evaluation (Signed)
Physical Therapy Evaluation Patient Details Name: Marco Caporale Sr. MRN: [id number] DOB: 28-Aug-1936 Today's Date: 02/02/2017   History of Present Illness  Pt is a 81 y/o M who presented for evaluation of syncope and generalized weakness.  Orthostatics have been positive in the hospital. Pt found to have UTI. Pt's PMH includes AAA and repair, a-fib/flutter, chronic systolic heart failure, CKD, DVT, MI, obesity, PVC, CABG.    Clinical Impression  Pt admitted with above diagnosis. Pt currently with functional limitations due to the deficits listed below (see PT Problem List). Marco [name] is from home with his wife who provides assist for dressing and some assist with other ADLs and IADLs.  Pt ambulates with his RW and is limited to ambulating household distances due to fatigue. He currently requires close min guard assist for transfers due to +orthostatics.  BP taken in supine 135/57, sitting 134/56 with +dizziness 6/10 that clears completely after ~1 minute, standing 94/47 +dizzines 4/10 which reduces but does not clear completely, sitting at end of session 111/54 and pt denies any symptoms of dizziness/lightheadedness.  Pt will benefit from skilled PT to increase their independence and safety with mobility and to improve ambulatory endurance to allow discharge to the venue listed below.      Follow Up Recommendations Home health PT    Equipment Recommendations  None recommended by PT    Recommendations for Other Services       Precautions / Restrictions Precautions Precautions: Fall;Other (comment) Precaution Comments: +orthostatic, monitor O2 Restrictions Weight Bearing Restrictions: No      Mobility  Bed Mobility Overal bed mobility: Needs Assistance Bed Mobility: Supine to Sit     Supine to sit: Min assist;HOB elevated     General bed mobility comments: Assist to elevate trunk.  Pt sleeps in recliner at home.  Transfers Overall transfer level: Needs  assistance Equipment used: Rolling walker (2 wheeled) Transfers: Sit to/from [facility] Sit to Stand: Min guard Stand pivot transfers: Min guard       General transfer comment: Close min guard for sit<>stand and stand pivot transfers due to orthostasis and dizziness.  Pt does not demonstrate any unsteadiness.  SpO2 remains at or above 93% on 1.5 L O2.  Ambulation/Gait             General Gait Details: Not safe to attempt at this time due to orthostasis.  Stairs            Wheelchair Mobility    Modified Rankin (Stroke Patients Only)       Balance Overall balance assessment: Needs assistance Sitting-balance support: No upper extremity supported;Feet supported Sitting balance-Leahy Scale: Normal     Standing balance support: No upper extremity supported;During functional activity Standing balance-Leahy Scale: Fair Standing balance comment: Pt able to stand statically without UE support but requires UE support for dynamic activities.                             Pertinent Vitals/Pain Pain Assessment: No/denies pain    Home Living Family/patient expects to be discharged to:: Private residence Living Arrangements: Spouse/significant other Available Help at Discharge: Family;Available 24 hours/day Type of Home: House Home Access: Stairs to enter Entrance Stairs-Rails: Left;Right;Can reach both Entrance Stairs-Number of Steps: 6   Home Equipment: Walker - 2 wheels;Cane - quad;Grab bars - tub/shower;Hand held shower head;Shower seat - built in;Transport chair      Prior Function Level of Independence: Needs  assistance   Gait / Transfers Assistance Needed: Uses transport chair when going to doctor appointments.  Uses RW majority of the time he is ambualting but uses quad cane at times but he does not feel steady with the cane.  Limited to ambulating household distances due to fatigue.  He reports one fall in the past 6 months (bruising  on RUE, was able to catch himself).    ADL's / Homemaking Assistance Needed: Pt's wife "sets up" the shower before he showers independently.  He requires assist to dry off.  His wife assists with donning socks and occasionally helps with donning pants and shirts.  Wife does the driving, cooking, cleaning.  Comments: Uses 1.5 O2 at home.     Hand Dominance   Dominant Hand: Right    Extremity/Trunk Assessment   Upper Extremity Assessment Upper Extremity Assessment: Overall WFL for tasks assessed (bruising RUE due to recent fall )    Lower Extremity Assessment Lower Extremity Assessment: LLE deficits/detail LLE Deficits / Details: L 2nd toe small ulcer with green color.  Pt is following podiatrist for this.  Pt reports he regularly inspects his feet for changes.  LLE strength grossly 5/5.       Communication   Communication: No difficulties  Cognition Arousal/Alertness: Awake/alert Behavior During Therapy: WFL for tasks assessed/performed Overall Cognitive Status: Within Functional Limits for tasks assessed                                        General Comments General comments (skin integrity, edema, etc.): Pt discloses to this therapist that he does not wear O2 when he leaves the home.  He says at times his oxygen gets as low as 85% and he does not realize this is an issue. Educated pt on the consequences of not using O2 as prescribed and encouraged pt to use O2 as prescribed by his doctor.  Pt verbalized understanding and was appreciative.  Also discussed the importance of taking transitions slowly (i.e. supine>sit, sit) and to never begin walking when he feels dizzy. BP taken in supine 135/57, sitting 134/56 with +dizziness 6/10 that clears completely after ~1 minute, standing 94/47 +dizzines 4/10 which reduces but does not clear completely, sitting at end of session 111/54 and pt denies any symptoms of dizziness/lightheadedness.    Exercises     Assessment/Plan     PT Assessment Patient needs continued PT services  PT Problem List Decreased strength;Decreased activity tolerance;Decreased balance;Decreased safety awareness;Cardiopulmonary status limiting activity       PT Treatment Interventions DME instruction;Gait training;Stair training;Functional mobility training;Therapeutic activities;Therapeutic exercise;Balance training;Neuromuscular re-education;Patient/family education;Other (comment) (Energy conservation techniques)    PT Goals (Current goals can be found in the Care Plan section)  Acute Rehab PT Goals Patient Stated Goal: to return home PT Goal Formulation: With patient Time For Goal Achievement: 02/16/17 Potential to Achieve Goals: Good    Frequency Min 2X/week   Barriers to discharge        Co-evaluation               AM-PAC PT "6 Clicks" Daily Activity  Outcome Measure Difficulty turning over in bed (including adjusting bedclothes, sheets and blankets)?: Total Difficulty moving from lying on back to sitting on the side of the bed? : Total Difficulty sitting down on and standing up from a chair with arms (e.g., wheelchair, bedside commode, etc,.)?: A Little Help needed  moving to and from a bed to chair (including a wheelchair)?: A Little Help needed walking in hospital room?: A Little Help needed climbing 3-5 steps with a railing? : A Little 6 Click Score: 14    End of Session Equipment Utilized During Treatment: Gait belt;Oxygen Activity Tolerance: Patient tolerated treatment well;Treatment limited secondary to medical complications (Comment) (orthostasis) Patient left: in chair;with call bell/phone within reach;with chair alarm set Nurse Communication: Mobility status;Other (comment) (SpO2, BP readings) PT Visit Diagnosis: History of falling (Z91.81);Unsteadiness on feet (R26.81)    Time: 6222-9798 PT Time Calculation (min) (ACUTE ONLY): 36 min   Charges:   PT Evaluation $PT Eval Low Complexity: 1  Procedure PT Treatments $Therapeutic Activity: 8-22 mins   PT G Codes:        Collie Siad PT, DPT 02/02/2017, 3:29 PM

## 2017-02-03 LAB — BASIC METABOLIC PANEL
Anion gap: 7 (ref 5–15)
BUN: 49 mg/dL — AB (ref 6–20)
CHLORIDE: 104 mmol/L (ref 101–111)
CO2: 33 mmol/L — ABNORMAL HIGH (ref 22–32)
Calcium: 9 mg/dL (ref 8.9–10.3)
Creatinine, Ser: 2.99 mg/dL — ABNORMAL HIGH (ref 0.61–1.24)
GFR, EST AFRICAN AMERICAN: 22 mL/min — AB (ref >60)
GFR, EST NON AFRICAN AMERICAN: 19 mL/min — AB (ref >60)
Glucose, Bld: 159 mg/dL — ABNORMAL HIGH (ref 65–99)
POTASSIUM: 3.7 mmol/L (ref 3.5–5.1)
SODIUM: 144 mmol/L (ref 135–145)

## 2017-02-03 LAB — ECHOCARDIOGRAM COMPLETE
HEIGHTINCHES: 72 in
WEIGHTICAEL: 4000 [oz_av]

## 2017-02-03 LAB — HEMOGLOBIN A1C
Hgb A1c MFr Bld: 6.1 % — ABNORMAL HIGH (ref 4.8–5.6)
MEAN PLASMA GLUCOSE: 128 mg/dL

## 2017-02-03 LAB — URINE CULTURE: Special Requests: NORMAL

## 2017-02-03 LAB — GLUCOSE, CAPILLARY
GLUCOSE-CAPILLARY: 146 mg/dL — AB (ref 65–99)
GLUCOSE-CAPILLARY: 150 mg/dL — AB (ref 65–99)
Glucose-Capillary: 127 mg/dL — ABNORMAL HIGH (ref 65–99)
Glucose-Capillary: 159 mg/dL — ABNORMAL HIGH (ref 65–99)

## 2017-02-03 MED ORDER — TAMSULOSIN HCL 0.4 MG PO CAPS
0.4000 mg | ORAL_CAPSULE | Freq: Every day | ORAL | Status: DC
  Administered 2017-02-03: 0.4 mg via ORAL
  Filled 2017-02-03: qty 1

## 2017-02-03 MED ORDER — IPRATROPIUM BROMIDE 0.06 % NA SOLN
2.0000 | Freq: Two times a day (BID) | NASAL | Status: DC
  Administered 2017-02-03 – 2017-02-07 (×8): 2 via NASAL
  Filled 2017-02-03: qty 15

## 2017-02-03 MED ORDER — VALACYCLOVIR HCL 500 MG PO TABS
1000.0000 mg | ORAL_TABLET | Freq: Two times a day (BID) | ORAL | Status: DC
  Administered 2017-02-03 – 2017-02-07 (×8): 1000 mg via ORAL
  Filled 2017-02-03 (×8): qty 2

## 2017-02-03 NOTE — Progress Notes (Signed)
Requesting Nasocort nasal spray, explained to pt that it was daily.  Call out to Dr. Leslye Peer for nasal spray.  New orders received.

## 2017-02-03 NOTE — Progress Notes (Signed)
Patient ID: Marco Mckeehan Sr., male   DOB: 02/11/1936, 81 y.o.   MRN: [id number]  [facility] PROGRESS NOTE  Marco [place] Sr. CWC:[id number] DOB: [birth year] DOA: 02/01/2017 PCP: Marco [name]., MD  HPI/Subjective: Patient feeling okay and asked to go home. He was still orthostatic this morning's blood pressure check.  Objective: Vitals:   02/03/17 0744 02/03/17 1134  BP: (!) 122/51 (!) 146/66  Pulse: 79 75  Resp:  18  Temp:  98.1 F (36.7 C)    Filed Weights   02/01/17 2124  Weight: 113.4 kg (250 lb)    ROS: Review of Systems  Constitutional: Negative for chills and fever.  Eyes: Negative for blurred vision.  Respiratory: Positive for shortness of breath. Negative for cough.   Cardiovascular: Negative for chest pain.  Gastrointestinal: Negative for abdominal pain, constipation, diarrhea, nausea and vomiting.  Genitourinary: Negative for dysuria.  Musculoskeletal: Negative for joint pain.  Neurological: Negative for dizziness and headaches.   Exam: Physical Exam  Constitutional: He is oriented to person, place, and time.  HENT:  Nose: No mucosal edema.  Mouth/Throat: No oropharyngeal exudate or posterior oropharyngeal edema.  Eyes: Conjunctivae, EOM and lids are normal. Pupils are equal, round, and reactive to light.  Neck: No JVD present. Carotid bruit is not present. No edema present. No thyroid mass and no thyromegaly present.  Cardiovascular: S1 normal and S2 normal.  Exam reveals no gallop.   No murmur heard. Pulses:      Dorsalis pedis pulses are 2+ on the right side, and 2+ on the left side.  Respiratory: No respiratory distress. He has decreased breath sounds in the right lower field and the left lower field. He has no wheezes. He has no rhonchi. He has no rales.  GI: Soft. Bowel sounds are normal. There is no tenderness.  Musculoskeletal:       Right ankle: He exhibits swelling.       Left ankle: He exhibits swelling.   Lymphadenopathy:    He has no cervical adenopathy.  Neurological: He is alert and oriented to person, place, and time. No cranial nerve deficit.  Skin: Skin is warm. No rash noted. Nails show no clubbing.  Psychiatric: He has a normal mood and affect.      Data Reviewed: Basic Metabolic Panel:  Recent Labs Lab 02/01/17 2122 02/02/17 0201 02/03/17 0447  NA 142 141 144  K 3.5 3.7 3.7  CL 99* 100* 104  CO2 34* 34* 33*  GLUCOSE 157* 130* 159*  BUN 57* 54* 49*  CREATININE 2.93* 2.77* 2.99*  CALCIUM 9.6 9.2 9.0   Liver Function Tests:  Recent Labs Lab 02/01/17 2122  AST 18  ALT 18  ALKPHOS 55  BILITOT 0.6  PROT 7.0  ALBUMIN 3.2*   CBC:  Recent Labs Lab 02/01/17 2122 02/02/17 0201  WBC 7.1 6.0  NEUTROABS 4.7  --   HGB 12.3* 11.3*  HCT 37.2* 34.2*  MCV 91.8 91.1  PLT 152 154   Cardiac Enzymes:  Recent Labs Lab 02/01/17 2122 02/02/17 0201 02/02/17 0751 02/02/17 1517  TROPONINI <0.03 <0.03 <0.03 <0.03    CBG:  Recent Labs Lab 02/02/17 1138 02/02/17 1657 02/02/17 2023 02/03/17 0737 02/03/17 1119  GLUCAP 138* 150* 229* 146* 150*    Recent Results (from the past 240 hour(s))  Urine culture     Status: Abnormal   Collection Time: 02/01/17 10:11 PM  Result Value Ref Range Status   Specimen Description URINE, CLEAN CATCH  Final   Special Requests Normal  Final   Culture MULTIPLE SPECIES PRESENT, SUGGEST RECOLLECTION (A)  Final   Report Status 02/03/2017 FINAL  Final     Studies: Ct Abdomen Pelvis Wo Contrast  Result Date: 02/02/2017 CLINICAL DATA:  Hematuria and chronic back pain EXAM: CT ABDOMEN AND PELVIS WITHOUT CONTRAST TECHNIQUE: Multidetector CT imaging of the abdomen and pelvis was performed following the standard protocol without IV contrast. COMPARISON:  10/30/2015, 01/12/2015, 03/12/2014 FINDINGS: Lower chest: Lung bases demonstrate scarring within both lung bases. Chronic consolidation in the left lower lobe. Stable chronic pleural  collections with calcifications on the left, also unchanged. Partially visualized cardiac pacing leads. Extensive coronary artery calcifications. Post CABG changes. Cardiomegaly. Hepatobiliary: No focal hepatic abnormality. Multiple calcified gallstones. No biliary dilatation. Pancreas: Unremarkable. No pancreatic ductal dilatation or surrounding inflammatory changes. Spleen: Normal in size without focal abnormality. Adrenals/Urinary Tract: Adrenal glands are within normal limits. Multiple calcifications within both kidneys, some of which are intravascular. Largest focal calcification on the left is seen in the upper pole and measures 9 mm. Largest focal calcification on the right is seen in the lower pole and measures 8 mm. Kidneys are atrophic. No hydronephrosis or ureteral stone. The bladder is normal. Stomach/Bowel: Stomach is within normal limits. Appendix appears normal. No evidence of bowel wall thickening, distention, or inflammatory changes. Vascular/Lymphatic: Extensive atherosclerotic calcifications of the aorta. Mild aneurysmal dilatation of the distal aorta measuring up to 3.8 cm, not significantly changed. No significantly enlarged abdominal or pelvic lymph nodes. Reproductive: Prostate calcification. Other: No free air or free fluid.  Fat in the inguinal regions. Musculoskeletal: Post sternotomy changes. Stable chronic pars defect at L5 with 14 mm anterolisthesis of L5 on S1. Degenerative changes of the lumbar spine. IMPRESSION: 1. Multiple intrarenal calcifications likely reflect combination of intrarenal vascular calcification and multiple stones. Atrophic kidneys. No hydronephrosis or evidence for ureteral stone. 2. Stable chronic pleuroparenchymal disease in the bilateral lung bases 3. Cardiomegaly with post CABG changes and coronary calcifications 4. Gallstones 5. Extensive atherosclerotic vascular disease of the aorta with stable aneurysmal dilatation of the distal infrarenal abdominal aorta 6.  Stable chronic pars defect at L5 with 14 mm anterolisthesis of L5 on S1 Electronically Signed   By: Donavan Foil M.D.   On: 02/02/2017 14:49   Dg Chest 1 View  Result Date: 02/01/2017 CLINICAL DATA:  Weakness and dizziness. EXAM: CHEST 1 VIEW COMPARISON:  October 28, 2015 FINDINGS: The left-sided effusion with underlying opacity is again identified and not significantly changed. The cardiomediastinal silhouette is stable. Haziness over the right base likely represent is effusion and opacity, also essentially stable in the interval. No acute interval changes. IMPRESSION: Bilateral pleural effusions and underlying atelectasis, not significantly changed since previous studies. Electronically Signed   By: Dorise Bullion III M.D   On: 02/01/2017 21:42   US Carotid Bilateral  Result Date: 02/02/2017 CLINICAL DATA:  Carotid stent EXAM: BILATERAL CAROTID DUPLEX ULTRASOUND TECHNIQUE: Pearline Cables scale imaging, color Doppler and duplex ultrasound were performed of bilateral carotid and vertebral arteries in the neck. COMPARISON:  None. FINDINGS: Criteria: Quantification of carotid stenosis is based on velocity parameters that correlate the residual internal carotid diameter with NASCET-based stenosis levels, using the diameter of the distal internal carotid lumen as the denominator for stenosis measurement. The following velocity measurements were obtained: RIGHT ICA:  172 cm/sec CCA:  89 cm/sec SYSTOLIC ICA/CCA RATIO:  1.9 DIASTOLIC ICA/CCA RATIO:  3.0 ECA:  83 cm/sec LEFT ICA:  248 cm/sec  CCA:  025 cm/sec SYSTOLIC ICA/CCA RATIO:  2.3 DIASTOLIC ICA/CCA RATIO:  3.3 ECA:  195 cm/sec RIGHT CAROTID ARTERY: Mild scattered calcified plaque in the mid and upper common carotid artery. No significant plaque in the bulb. Low resistance internal carotid Doppler pattern. There is scattered plaque in the mid internal carotid artery. RIGHT VERTEBRAL ARTERY:  Antegrade. LEFT CAROTID ARTERY: There is significant irregular plaque in the bulb  which shadows the lumen of the internal carotid artery. Low resistance internal carotid Doppler pattern is preserved. LEFT VERTEBRAL ARTERY:  Antegrade. IMPRESSION: Less than 50% stenosis in the right internal carotid artery. Greater than 70% stenosis in the left internal carotid artery. Electronically Signed   By: Marybelle Killings M.D.   On: 02/02/2017 10:53    Scheduled Meds: . amiodarone  200 mg Oral Daily  . apixaban  5 mg Oral BID  . calcitRIOL  0.5 mcg Oral Daily  . carvedilol  3.125 mg Oral BID WC  . cholecalciferol  1,000 Units Oral Daily  . docusate sodium  100 mg Oral BID  . finasteride  5 mg Oral Daily  . furosemide  40 mg Oral QODAY  . insulin aspart  0-20 Units Subcutaneous TID WC  . insulin aspart  0-5 Units Subcutaneous QHS  . mouth rinse  15 mL Mouth Rinse BID  . mometasone  1 application Topical BID  . multivitamin with minerals  1 tablet Oral Daily  . pantoprazole  40 mg Oral Q M,W,F  . rosuvastatin  10 mg Oral QHS  . tamsulosin  0.4 mg Oral QPC supper  . triamcinolone  2 spray Nasal Daily  . valACYclovir  1,000 mg Oral Q12H   Continuous Infusions: . sodium chloride 75 mL/hr at 02/03/17 0734  . cefTRIAXone (ROCEPHIN) IVPB 1 gram/50 mL D5W 1 g (02/02/17 1713)    Assessment/Plan:  1. Orthostatic hypotension. Near syncope and generalized weakness. Continue IV fluid hydration. Start TED hose. Change Flomax to evening dosing. Discontinue Cozaar. Potentially need to get rid of Coreg also 2. Acute on chronic kidney disease. Continue IV fluid hydration 3. Acute cystitis with hematuria. Empiric Rocephin. Culture multiple organisms. 4. Atrial fibrillation history on Eliquis and amiodarone and Coreg. 5. BPH on Flomax changed evening dose on Proscar 6. Hyperlipidemia unspecified on Crestor 7. GERD on omeprazole  Code Status:     Code Status Orders        Start     Ordered   02/02/17 0153  Full code  Continuous     02/02/17 0152    Code Status History    Date Active  Date Inactive Code Status Order ID Comments User Context   06/10/2016  9:09 PM 06/12/2016  3:20 PM Full Code [id number]  Idelle Crouch, MD Inpatient   10/28/2015 11:14 AM 10/29/2015  3:01 PM Full Code [id number]  Hillary Bow, MD ED    Advance Directive Documentation     Most Recent Value  Type of Advance Directive  Living will, Healthcare Power of Attorney  Pre-existing out of facility DNR order (yellow form or pink MOST form)  --  "MOST" Form in Place?  --     Family Communication: Wife at bedside Disposition Plan: Home with home health physical therapy once less orthostatic  Consultants:  Nephrology  Antibiotics: - Rocephin  Time spent: 28 minutes  [place], [place]

## 2017-02-03 NOTE — Progress Notes (Signed)
Pt lt nare started to bleed after using nasal spray.  Will monitor pt.

## 2017-02-03 NOTE — Progress Notes (Signed)
PHARMACY NOTE:  ANTIMICROBIAL RENAL DOSAGE ADJUSTMENT  Current antimicrobial regimen includes a mismatch between antimicrobial dosage and estimated renal function.  As per policy approved by the Pharmacy & Therapeutics and Medical Executive Committees, the antimicrobial dosage will be adjusted accordingly.  Current antimicrobial dosage:  Valacyclovir 1gm Q8H  Renal Function:  Estimated Creatinine Clearance: 26.5 mL/min (A) (by C-G formula based on SCr of 2.99 mg/dL (H)). []      On intermittent HD, scheduled: []      On CRRT    Antimicrobial dosage has been changed to:  Valacyclovir 1gm Q12H   Additional comments: Patient started 1 week course on 6/6 per pharmacy records.  Thank you for allowing pharmacy to be a part of this patient's care.  Cheri Guppy, [facility] 02/03/2017 10:40 AM

## 2017-02-03 NOTE — Progress Notes (Signed)
[facility]  ROUNDING NOTE   Subjective:  Renal function slightly worse today with a BUN of 49 and creatinine of 2.99. Urine output was recorded as 850 cc over the preceding 24 hours. Patient has had periods of orthostasis.   Objective:  Vital signs in last 24 hours:  Temp:  [97.6 F (36.4 C)-99.3 F (37.4 C)] 98.1 F (36.7 C) (06/09 1134) Pulse Rate:  [60-79] 75 (06/09 1134) Resp:  [16-24] 18 (06/09 1134) BP: (122-159)/(51-74) 146/66 (06/09 1134) SpO2:  [98 %-99 %] 99 % (06/09 1134)  Weight change:  Filed Weights   02/01/17 2124  Weight: 113.4 kg (250 lb)    Intake/Output: I/O last 3 completed shifts: In: 2521.3 [P.O.:480; I.V.:1941.3; IV Piggyback:100] Out: 850 [Urine:850]   Intake/Output this shift:  Total I/O In: 110 [P.O.:110] Out: 700 [Urine:700]  Physical Exam: General: No acute distress  Head: Normocephalic, atraumatic. Moist oral mucosal membranes  Eyes: Anicteric  Neck: Supple, trachea midline  Lungs:  Clear to auscultation, normal effort  Heart: S1S2 no rubs  Abdomen:  Soft, nontender, bowel sounds present  Extremities: Trace peripheral edema.  Neurologic: Awake, alert, following commands  Skin: No lesions       Basic Metabolic Panel:  Recent Labs Lab 02/01/17 2122 02/02/17 0201 02/03/17 0447  NA 142 141 144  K 3.5 3.7 3.7  CL 99* 100* 104  CO2 34* 34* 33*  GLUCOSE 157* 130* 159*  BUN 57* 54* 49*  CREATININE 2.93* 2.77* 2.99*  CALCIUM 9.6 9.2 9.0    Liver Function Tests:  Recent Labs Lab 02/01/17 2122  AST 18  ALT 18  ALKPHOS 55  BILITOT 0.6  PROT 7.0  ALBUMIN 3.2*   No results for input(s): LIPASE, AMYLASE in the last 168 hours. No results for input(s): AMMONIA in the last 168 hours.  CBC:  Recent Labs Lab 02/01/17 2122 02/02/17 0201  WBC 7.1 6.0  NEUTROABS 4.7  --   HGB 12.3* 11.3*  HCT 37.2* 34.2*  MCV 91.8 91.1  PLT 152 154    Cardiac Enzymes:  Recent Labs Lab 02/01/17 2122 02/02/17 0201  02/02/17 0751 02/02/17 1517  TROPONINI <0.03 <0.03 <0.03 <0.03    BNP: Invalid input(s): POCBNP  CBG:  Recent Labs Lab 02/02/17 1138 02/02/17 1657 02/02/17 2023 02/03/17 0737 02/03/17 1119  GLUCAP 138* 150* 229* 146* 150*    Microbiology: Results for orders placed or performed during the hospital encounter of 02/01/17  Urine culture     Status: Abnormal   Collection Time: 02/01/17 10:11 PM  Result Value Ref Range Status   Specimen Description URINE, CLEAN CATCH  Final   Special Requests Normal  Final   Culture MULTIPLE SPECIES PRESENT, SUGGEST RECOLLECTION (A)  Final   Report Status 02/03/2017 FINAL  Final    Coagulation Studies: No results for input(s): LABPROT, INR in the last 72 hours.  Urinalysis:  Recent Labs  02/01/17 2211  COLORURINE YELLOW*  LABSPEC 1.012  PHURINE 6.0  GLUCOSEU 150*  HGBUR LARGE*  BILIRUBINUR NEGATIVE  KETONESUR NEGATIVE  PROTEINUR 30*  NITRITE NEGATIVE  LEUKOCYTESUR MODERATE*      Imaging: Ct Abdomen Pelvis Wo Contrast  Result Date: 02/02/2017 CLINICAL DATA:  Hematuria and chronic back pain EXAM: CT ABDOMEN AND PELVIS WITHOUT CONTRAST TECHNIQUE: Multidetector CT imaging of the abdomen and pelvis was performed following the standard protocol without IV contrast. COMPARISON:  10/30/2015, 01/12/2015, 03/12/2014 FINDINGS: Lower chest: Lung bases demonstrate scarring within both lung bases. Chronic consolidation in the left  lower lobe. Stable chronic pleural collections with calcifications on the left, also unchanged. Partially visualized cardiac pacing leads. Extensive coronary artery calcifications. Post CABG changes. Cardiomegaly. Hepatobiliary: No focal hepatic abnormality. Multiple calcified gallstones. No biliary dilatation. Pancreas: Unremarkable. No pancreatic ductal dilatation or surrounding inflammatory changes. Spleen: Normal in size without focal abnormality. Adrenals/Urinary Tract: Adrenal glands are within normal limits.  Multiple calcifications within both kidneys, some of which are intravascular. Largest focal calcification on the left is seen in the upper pole and measures 9 mm. Largest focal calcification on the right is seen in the lower pole and measures 8 mm. Kidneys are atrophic. No hydronephrosis or ureteral stone. The bladder is normal. Stomach/Bowel: Stomach is within normal limits. Appendix appears normal. No evidence of bowel wall thickening, distention, or inflammatory changes. Vascular/Lymphatic: Extensive atherosclerotic calcifications of the aorta. Mild aneurysmal dilatation of the distal aorta measuring up to 3.8 cm, not significantly changed. No significantly enlarged abdominal or pelvic lymph nodes. Reproductive: Prostate calcification. Other: No free air or free fluid.  Fat in the inguinal regions. Musculoskeletal: Post sternotomy changes. Stable chronic pars defect at L5 with 14 mm anterolisthesis of L5 on S1. Degenerative changes of the lumbar spine. IMPRESSION: 1. Multiple intrarenal calcifications likely reflect combination of intrarenal vascular calcification and multiple stones. Atrophic kidneys. No hydronephrosis or evidence for ureteral stone. 2. Stable chronic pleuroparenchymal disease in the bilateral lung bases 3. Cardiomegaly with post CABG changes and coronary calcifications 4. Gallstones 5. Extensive atherosclerotic vascular disease of the aorta with stable aneurysmal dilatation of the distal infrarenal abdominal aorta 6. Stable chronic pars defect at L5 with 14 mm anterolisthesis of L5 on S1 Electronically Signed   By: Donavan Foil M.D.   On: 02/02/2017 14:49   Dg Chest 1 View  Result Date: 02/01/2017 CLINICAL DATA:  Weakness and dizziness. EXAM: CHEST 1 VIEW COMPARISON:  October 28, 2015 FINDINGS: The left-sided effusion with underlying opacity is again identified and not significantly changed. The cardiomediastinal silhouette is stable. Haziness over the right base likely represent is effusion  and opacity, also essentially stable in the interval. No acute interval changes. IMPRESSION: Bilateral pleural effusions and underlying atelectasis, not significantly changed since previous studies. Electronically Signed   By: Dorise Bullion III M.D   On: 02/01/2017 21:42   US Carotid Bilateral  Result Date: 02/02/2017 CLINICAL DATA:  Carotid stent EXAM: BILATERAL CAROTID DUPLEX ULTRASOUND TECHNIQUE: Pearline Cables scale imaging, color Doppler and duplex ultrasound were performed of bilateral carotid and vertebral arteries in the neck. COMPARISON:  None. FINDINGS: Criteria: Quantification of carotid stenosis is based on velocity parameters that correlate the residual internal carotid diameter with NASCET-based stenosis levels, using the diameter of the distal internal carotid lumen as the denominator for stenosis measurement. The following velocity measurements were obtained: RIGHT ICA:  172 cm/sec CCA:  89 cm/sec SYSTOLIC ICA/CCA RATIO:  1.9 DIASTOLIC ICA/CCA RATIO:  3.0 ECA:  83 cm/sec LEFT ICA:  248 cm/sec CCA:  063 cm/sec SYSTOLIC ICA/CCA RATIO:  2.3 DIASTOLIC ICA/CCA RATIO:  3.3 ECA:  195 cm/sec RIGHT CAROTID ARTERY: Mild scattered calcified plaque in the mid and upper common carotid artery. No significant plaque in the bulb. Low resistance internal carotid Doppler pattern. There is scattered plaque in the mid internal carotid artery. RIGHT VERTEBRAL ARTERY:  Antegrade. LEFT CAROTID ARTERY: There is significant irregular plaque in the bulb which shadows the lumen of the internal carotid artery. Low resistance internal carotid Doppler pattern is preserved. LEFT VERTEBRAL ARTERY:  Antegrade. IMPRESSION: Less  than 50% stenosis in the right internal carotid artery. Greater than 70% stenosis in the left internal carotid artery. Electronically Signed   By: Marybelle Killings M.D.   On: 02/02/2017 10:53     Medications:   . sodium chloride 75 mL/hr at 02/03/17 0734  . cefTRIAXone (ROCEPHIN) IVPB 1 gram/50 mL D5W 1 g  (02/02/17 1713)   . amiodarone  200 mg Oral Daily  . apixaban  5 mg Oral BID  . calcitRIOL  0.5 mcg Oral Daily  . carvedilol  3.125 mg Oral BID WC  . cholecalciferol  1,000 Units Oral Daily  . docusate sodium  100 mg Oral BID  . finasteride  5 mg Oral Daily  . furosemide  40 mg Oral QODAY  . insulin aspart  0-20 Units Subcutaneous TID WC  . insulin aspart  0-5 Units Subcutaneous QHS  . mouth rinse  15 mL Mouth Rinse BID  . mometasone  1 application Topical BID  . multivitamin with minerals  1 tablet Oral Daily  . pantoprazole  40 mg Oral Q M,W,F  . rosuvastatin  10 mg Oral QHS  . tamsulosin  0.4 mg Oral QPC supper  . triamcinolone  2 spray Nasal Daily  . valACYclovir  1,000 mg Oral Q12H   acetaminophen **OR** acetaminophen, albuterol, bisacodyl, ipratropium, magnesium citrate, ondansetron **OR** ondansetron (ZOFRAN) IV, oxyCODONE, polyethylene glycol, senna-docusate  Assessment/ Plan:  81 y.o. male with hypertension, diabetes mellitus, hyperlipidemia, chronic diastolic heart failure, chronic kidney disease stage IV, coronary disease status post CABG in 2006, abdominal aortic aneurysm repair, and permanent pacemaker placement admitted for presynocpe.  Has also had intermittent gross hematuria.    1. Chronic kidney disease stage IV/diabetes mellitus type 2 with chronic kidney disease/proteinuria.  -  Creatinine slightly higher at 2.99 today. Continue IV fluid hydration for now. Follow-up renal function in a.m.   2. Hypertension. Case discussed with hospitalist. We are considering discontinuing losartan given periods of orthostasis.   3. Secondary hyperparathyroidism. Maintain the patient on calcitriol as prescribed.   4. Anemia chronic and disease.  Continue to periodically monitor hemoglobin. Most recent hemoglobin was 11.3.  5. Intermittent gross hematuria. The patient has had a cystoscopy this past year. CT scan abdomen and pelvis performed and was negative for source of  bleeding.  6.  Presyncope:  Discontinue losartan as above.    LOS: 1 Zaynah Chawla 6/9/201812:03 PM

## 2017-02-04 ENCOUNTER — Inpatient Hospital Stay: Payer: Medicare Other

## 2017-02-04 LAB — BASIC METABOLIC PANEL
Anion gap: 4 — ABNORMAL LOW (ref 5–15)
BUN: 43 mg/dL — AB (ref 6–20)
CALCIUM: 9.1 mg/dL (ref 8.9–10.3)
CO2: 33 mmol/L — ABNORMAL HIGH (ref 22–32)
CREATININE: 2.43 mg/dL — AB (ref 0.61–1.24)
Chloride: 108 mmol/L (ref 101–111)
GFR calc Af Amer: 28 mL/min — ABNORMAL LOW (ref >60)
GFR, EST NON AFRICAN AMERICAN: 24 mL/min — AB (ref >60)
Glucose, Bld: 141 mg/dL — ABNORMAL HIGH (ref 65–99)
Potassium: 4 mmol/L (ref 3.5–5.1)
SODIUM: 145 mmol/L (ref 135–145)

## 2017-02-04 LAB — GLUCOSE, CAPILLARY
GLUCOSE-CAPILLARY: 131 mg/dL — AB (ref 65–99)
Glucose-Capillary: 176 mg/dL — ABNORMAL HIGH (ref 65–99)
Glucose-Capillary: 138 mg/dL — ABNORMAL HIGH (ref 65–99)
Glucose-Capillary: 131 mg/dL — ABNORMAL HIGH (ref 65–99)

## 2017-02-04 MED ORDER — MIDODRINE HCL 5 MG PO TABS
5.0000 mg | ORAL_TABLET | Freq: Three times a day (TID) | ORAL | Status: DC
  Administered 2017-02-04 – 2017-02-05 (×3): 5 mg via ORAL
  Filled 2017-02-04 (×3): qty 1

## 2017-02-04 MED ORDER — ALPRAZOLAM 0.25 MG PO TABS
0.2500 mg | ORAL_TABLET | Freq: Once | ORAL | Status: AC
  Administered 2017-02-04: 0.25 mg via ORAL
  Filled 2017-02-04: qty 1

## 2017-02-04 MED ORDER — NYSTATIN 100000 UNIT/GM EX POWD
Freq: Two times a day (BID) | CUTANEOUS | Status: DC
  Administered 2017-02-04 – 2017-02-07 (×7): via TOPICAL
  Filled 2017-02-04: qty 15

## 2017-02-04 MED ORDER — FUROSEMIDE 10 MG/ML IJ SOLN
40.0000 mg | Freq: Once | INTRAMUSCULAR | Status: AC
  Administered 2017-02-04: 40 mg via INTRAVENOUS
  Filled 2017-02-04: qty 4

## 2017-02-04 NOTE — Progress Notes (Signed)
[facility]  ROUNDING NOTE   Subjective:  Patient developed urinary retention yesterday. Thereafter Foley catheter was placed. Creatinine has dropped to 2.43. Patient continues to have periods of orthostasis however.   Objective:  Vital signs in last 24 hours:  Temp:  [97.6 F (36.4 C)-98.2 F (36.8 C)] 97.6 F (36.4 C) (06/10 1138) Pulse Rate:  [66-76] 76 (06/10 1138) Resp:  [15-18] 16 (06/10 0800) BP: (97-161)/(51-69) 161/68 (06/10 1138) SpO2:  [95 %-100 %] 99 % (06/10 1138)  Weight change:  Filed Weights   02/01/17 2124  Weight: 113.4 kg (250 lb)    Intake/Output: I/O last 3 completed shifts: In: 3223.8 [P.O.:460; I.V.:2713.8; IV Piggyback:50] Out: 9485 [Urine:1550]   Intake/Output this shift:  Total I/O In: 1833.8 [P.O.:240; I.V.:193.8; Other:1400] Out: -   Physical Exam: General: No acute distress  Head: Normocephalic, atraumatic. Moist oral mucosal membranes  Eyes: Anicteric  Neck: Supple, trachea midline  Lungs:  Clear to auscultation, normal effort  Heart: S1S2 no rubs  Abdomen:  Soft, nontender, bowel sounds present  Extremities: Trace peripheral edema.  Neurologic: Awake, alert, following commands  Skin: No lesions       Basic Metabolic Panel:  Recent Labs Lab 02/01/17 2122 02/02/17 0201 02/03/17 0447 02/04/17 0421  NA 142 141 144 145  K 3.5 3.7 3.7 4.0  CL 99* 100* 104 108  CO2 34* 34* 33* 33*  GLUCOSE 157* 130* 159* 141*  BUN 57* 54* 49* 43*  CREATININE 2.93* 2.77* 2.99* 2.43*  CALCIUM 9.6 9.2 9.0 9.1    Liver Function Tests:  Recent Labs Lab 02/01/17 2122  AST 18  ALT 18  ALKPHOS 55  BILITOT 0.6  PROT 7.0  ALBUMIN 3.2*   No results for input(s): LIPASE, AMYLASE in the last 168 hours. No results for input(s): AMMONIA in the last 168 hours.  CBC:  Recent Labs Lab 02/01/17 2122 02/02/17 0201  WBC 7.1 6.0  NEUTROABS 4.7  --   HGB 12.3* 11.3*  HCT 37.2* 34.2*  MCV 91.8 91.1  PLT 152 154    Cardiac  Enzymes:  Recent Labs Lab 02/01/17 2122 02/02/17 0201 02/02/17 0751 02/02/17 1517  TROPONINI <0.03 <0.03 <0.03 <0.03    BNP: Invalid input(s): POCBNP  CBG:  Recent Labs Lab 02/03/17 1119 02/03/17 1640 02/03/17 2031 02/04/17 0738 02/04/17 1154  GLUCAP 150* 127* 159* 131* 176*    Microbiology: Results for orders placed or performed during the hospital encounter of 02/01/17  Urine culture     Status: Abnormal   Collection Time: 02/01/17 10:11 PM  Result Value Ref Range Status   Specimen Description URINE, CLEAN CATCH  Final   Special Requests Normal  Final   Culture MULTIPLE SPECIES PRESENT, SUGGEST RECOLLECTION (A)  Final   Report Status 02/03/2017 FINAL  Final    Coagulation Studies: No results for input(s): LABPROT, INR in the last 72 hours.  Urinalysis:  Recent Labs  02/01/17 2211  COLORURINE YELLOW*  LABSPEC 1.012  PHURINE 6.0  GLUCOSEU 150*  HGBUR LARGE*  BILIRUBINUR NEGATIVE  KETONESUR NEGATIVE  PROTEINUR 30*  NITRITE NEGATIVE  LEUKOCYTESUR MODERATE*      Imaging: Ct Abdomen Pelvis Wo Contrast  Result Date: 02/02/2017 CLINICAL DATA:  Hematuria and chronic back pain EXAM: CT ABDOMEN AND PELVIS WITHOUT CONTRAST TECHNIQUE: Multidetector CT imaging of the abdomen and pelvis was performed following the standard protocol without IV contrast. COMPARISON:  10/30/2015, 01/12/2015, 03/12/2014 FINDINGS: Lower chest: Lung bases demonstrate scarring within both lung bases. Chronic consolidation  in the left lower lobe. Stable chronic pleural collections with calcifications on the left, also unchanged. Partially visualized cardiac pacing leads. Extensive coronary artery calcifications. Post CABG changes. Cardiomegaly. Hepatobiliary: No focal hepatic abnormality. Multiple calcified gallstones. No biliary dilatation. Pancreas: Unremarkable. No pancreatic ductal dilatation or surrounding inflammatory changes. Spleen: Normal in size without focal abnormality.  Adrenals/Urinary Tract: Adrenal glands are within normal limits. Multiple calcifications within both kidneys, some of which are intravascular. Largest focal calcification on the left is seen in the upper pole and measures 9 mm. Largest focal calcification on the right is seen in the lower pole and measures 8 mm. Kidneys are atrophic. No hydronephrosis or ureteral stone. The bladder is normal. Stomach/Bowel: Stomach is within normal limits. Appendix appears normal. No evidence of bowel wall thickening, distention, or inflammatory changes. Vascular/Lymphatic: Extensive atherosclerotic calcifications of the aorta. Mild aneurysmal dilatation of the distal aorta measuring up to 3.8 cm, not significantly changed. No significantly enlarged abdominal or pelvic lymph nodes. Reproductive: Prostate calcification. Other: No free air or free fluid.  Fat in the inguinal regions. Musculoskeletal: Post sternotomy changes. Stable chronic pars defect at L5 with 14 mm anterolisthesis of L5 on S1. Degenerative changes of the lumbar spine. IMPRESSION: 1. Multiple intrarenal calcifications likely reflect combination of intrarenal vascular calcification and multiple stones. Atrophic kidneys. No hydronephrosis or evidence for ureteral stone. 2. Stable chronic pleuroparenchymal disease in the bilateral lung bases 3. Cardiomegaly with post CABG changes and coronary calcifications 4. Gallstones 5. Extensive atherosclerotic vascular disease of the aorta with stable aneurysmal dilatation of the distal infrarenal abdominal aorta 6. Stable chronic pars defect at L5 with 14 mm anterolisthesis of L5 on S1 Electronically Signed   By: Donavan Foil M.D.   On: 02/02/2017 14:49   Dg Chest Port 1 View  Result Date: 02/04/2017 CLINICAL DATA:  Hypotension EXAM: PORTABLE CHEST 1 VIEW COMPARISON:  Chest radiograph dated 02/01/2017. CT chest dated 01/08/2017. FINDINGS: Left lower lobe opacity, likely a combination of atelectasis and pleural effusion when  correlating with prior CT. Small layering right pleural effusion with associated right lower lobe scarring, also unchanged from prior CT. Increased interstitial markings without frank interstitial edema. Cardiomegaly. Prostatic valve. Postsurgical changes related to prior CABG. Left subclavian pacemaker. Median sternotomy. IMPRESSION: Chronic changes in the bilateral lower lobes, unchanged from prior CT. No evidence of acute cardiopulmonary disease. Electronically Signed   By: Julian Hy M.D.   On: 02/04/2017 10:42     Medications:   . cefTRIAXone (ROCEPHIN) IVPB 1 gram/50 mL D5W Stopped (02/03/17 1734)   . amiodarone  200 mg Oral Daily  . apixaban  5 mg Oral BID  . calcitRIOL  0.5 mcg Oral Daily  . cholecalciferol  1,000 Units Oral Daily  . docusate sodium  100 mg Oral BID  . finasteride  5 mg Oral Daily  . insulin aspart  0-20 Units Subcutaneous TID WC  . insulin aspart  0-5 Units Subcutaneous QHS  . ipratropium  2 spray Each Nare BID  . mouth rinse  15 mL Mouth Rinse BID  . midodrine  5 mg Oral TID WC  . mometasone  1 application Topical BID  . multivitamin with minerals  1 tablet Oral Daily  . nystatin   Topical BID  . pantoprazole  40 mg Oral Q M,W,F  . rosuvastatin  10 mg Oral QHS  . triamcinolone  2 spray Nasal Daily  . valACYclovir  1,000 mg Oral Q12H   acetaminophen **OR** acetaminophen, albuterol, bisacodyl, ipratropium,  magnesium citrate, ondansetron **OR** ondansetron (ZOFRAN) IV, oxyCODONE, polyethylene glycol, senna-docusate  Assessment/ Plan:  81 y.o. male with hypertension, diabetes mellitus, hyperlipidemia, chronic diastolic heart failure, chronic kidney disease stage IV, coronary disease status post CABG in 2006, abdominal aortic aneurysm repair, and permanent pacemaker placement admitted for presynocpe.  Has also had intermittent gross hematuria.    1. Chronic kidney disease stage IV/diabetes mellitus type 2 with chronic kidney disease/proteinuria.  -   Patient developed some urinary retention yesterday. Foley catheter placed. Creatinine down to 2.4. Continue to monitor renal function daily.   2. Hypertension. Patient with hypertension in the office but it appears that he also has a component of orthostasis. His blood pressure does drop significantly when standing. Agree with discontinuation of losartan and adding midodrine for now.   3. Secondary hyperparathyroidism. Continue calcitriol.   4. Anemia chronic and disease.  No indication for Epogen at the moment. Most recent hemoglobin was 11.3.  5. Intermittent gross hematuria. The patient has had a cystoscopy this past year. CT scan abdomen and pelvis performed and was negative for source of bleeding.      LOS: 2 Shanay Woolman 6/10/20181:08 PM

## 2017-02-04 NOTE — Progress Notes (Signed)
Pt complaining of being unable to void, stated has not voided since last night.  Intake and output shows last void at 2320.  Bladder scan shows >754ml in bladder.  Page out to Dr. Leslye Peer.

## 2017-02-04 NOTE — Progress Notes (Signed)
Notified Dr. Almyra Free patient complained of extreme anxiety, feeling he was seeing things that weren't there and that he was going to "black out". Notified Dr. Almyra Free that bp has been up and down at 97/51 and going up to 161/61. A one time order for xanax given. Will continue to monitor.

## 2017-02-04 NOTE — Plan of Care (Signed)
Problem: Safety: Goal: Ability to remain free from injury will improve Outcome: Progressing Fall precautions in place, non skid socks when oob  Problem: Skin Integrity: Goal: Risk for impaired skin integrity will decrease Outcome: Not Progressing MASD to groin, dressing change to lt buttock bid  Problem: Activity: Goal: Risk for activity intolerance will decrease Outcome: Progressing TED hose in place  Problem: Education: Goal: Knowledge of treatment and prevention of UTI/Pyleonephritis will improve Outcome: Progressing On IV antibiotics

## 2017-02-04 NOTE — Progress Notes (Signed)
Patient ID: Marco Bretado Sr., male   DOB: Feb 02, 1936, 81 y.o.   MRN: [id number]  [facility] PROGRESS NOTE  Marco Kluth [name] GQQ:[id number] DOB: Dec 14, 1935 DOA: 02/01/2017 PCP: Marco Banana., MD  HPI/Subjective: Patient had a rough night overnight. Did not sleep very well.  Objective: Vitals:   02/04/17 0800 02/04/17 1138  BP:  (!) 161/68  Pulse:  76  Resp: 16   Temp: 98.2 F (36.8 C) 97.6 F (36.4 C)    Filed Weights   02/01/17 2124  Weight: 113.4 kg (250 lb)    ROS: Review of Systems  Constitutional: Negative for chills and fever.  Eyes: Negative for blurred vision.  Respiratory: Positive for shortness of breath. Negative for cough.   Cardiovascular: Negative for chest pain.  Gastrointestinal: Positive for abdominal pain. Negative for constipation, diarrhea, nausea and vomiting.  Genitourinary: Negative for dysuria.  Musculoskeletal: Negative for joint pain.  Neurological: Negative for dizziness and headaches.   Exam: Physical Exam  Constitutional: He is oriented to person, place, and time.  HENT:  Nose: No mucosal edema.  Mouth/Throat: No oropharyngeal exudate or posterior oropharyngeal edema.  Eyes: Conjunctivae, EOM and lids are normal. Pupils are equal, round, and reactive to light.  Neck: No JVD present. Carotid bruit is not present. No edema present. No thyroid mass and no thyromegaly present.  Cardiovascular: S1 normal and S2 normal.  Exam reveals no gallop.   No murmur heard. Pulses:      Dorsalis pedis pulses are 2+ on the right side, and 2+ on the left side.  Respiratory: No respiratory distress. He has decreased breath sounds in the right lower field and the left lower field. He has wheezes in the right middle field. He has rhonchi in the right lower field and the left lower field. He has no rales.  GI: Soft. Bowel sounds are normal. He exhibits distension. There is tenderness in the suprapubic area.  Musculoskeletal:        Right ankle: He exhibits swelling.       Left ankle: He exhibits swelling.  Lymphadenopathy:    He has no cervical adenopathy.  Neurological: He is alert and oriented to person, place, and time. No cranial nerve deficit.  Skin: Skin is warm. No rash noted. Nails show no clubbing.  Psychiatric: He has a normal mood and affect.      Data Reviewed: Basic Metabolic Panel:  Recent Labs Lab 02/01/17 2122 02/02/17 0201 02/03/17 0447 02/04/17 0421  NA 142 141 144 145  K 3.5 3.7 3.7 4.0  CL 99* 100* 104 108  CO2 34* 34* 33* 33*  GLUCOSE 157* 130* 159* 141*  BUN 57* 54* 49* 43*  CREATININE 2.93* 2.77* 2.99* 2.43*  CALCIUM 9.6 9.2 9.0 9.1   Liver Function Tests:  Recent Labs Lab 02/01/17 2122  AST 18  ALT 18  ALKPHOS 55  BILITOT 0.6  PROT 7.0  ALBUMIN 3.2*   CBC:  Recent Labs Lab 02/01/17 2122 02/02/17 0201  WBC 7.1 6.0  NEUTROABS 4.7  --   HGB 12.3* 11.3*  HCT 37.2* 34.2*  MCV 91.8 91.1  PLT 152 154   Cardiac Enzymes:  Recent Labs Lab 02/01/17 2122 02/02/17 0201 02/02/17 0751 02/02/17 1517  TROPONINI <0.03 <0.03 <0.03 <0.03    CBG:  Recent Labs Lab 02/03/17 1119 02/03/17 1640 02/03/17 2031 02/04/17 0738 02/04/17 1154  GLUCAP 150* 127* 159* 131* 176*    Recent Results (from the past 240 hour(s))  Urine  culture     Status: Abnormal   Collection Time: 02/01/17 10:11 PM  Result Value Ref Range Status   Specimen Description URINE, CLEAN CATCH  Final   Special Requests Normal  Final   Culture MULTIPLE SPECIES PRESENT, SUGGEST RECOLLECTION (A)  Final   Report Status 02/03/2017 FINAL  Final     Studies: Ct Abdomen Pelvis Wo Contrast  Result Date: 02/02/2017 CLINICAL DATA:  Hematuria and chronic back pain EXAM: CT ABDOMEN AND PELVIS WITHOUT CONTRAST TECHNIQUE: Multidetector CT imaging of the abdomen and pelvis was performed following the standard protocol without IV contrast. COMPARISON:  10/30/2015, 01/12/2015, 03/12/2014 FINDINGS: Lower chest:  Lung bases demonstrate scarring within both lung bases. Chronic consolidation in the left lower lobe. Stable chronic pleural collections with calcifications on the left, also unchanged. Partially visualized cardiac pacing leads. Extensive coronary artery calcifications. Post CABG changes. Cardiomegaly. Hepatobiliary: No focal hepatic abnormality. Multiple calcified gallstones. No biliary dilatation. Pancreas: Unremarkable. No pancreatic ductal dilatation or surrounding inflammatory changes. Spleen: Normal in size without focal abnormality. Adrenals/Urinary Tract: Adrenal glands are within normal limits. Multiple calcifications within both kidneys, some of which are intravascular. Largest focal calcification on the left is seen in the upper pole and measures 9 mm. Largest focal calcification on the right is seen in the lower pole and measures 8 mm. Kidneys are atrophic. No hydronephrosis or ureteral stone. The bladder is normal. Stomach/Bowel: Stomach is within normal limits. Appendix appears normal. No evidence of bowel wall thickening, distention, or inflammatory changes. Vascular/Lymphatic: Extensive atherosclerotic calcifications of the aorta. Mild aneurysmal dilatation of the distal aorta measuring up to 3.8 cm, not significantly changed. No significantly enlarged abdominal or pelvic lymph nodes. Reproductive: Prostate calcification. Other: No free air or free fluid.  Fat in the inguinal regions. Musculoskeletal: Post sternotomy changes. Stable chronic pars defect at L5 with 14 mm anterolisthesis of L5 on S1. Degenerative changes of the lumbar spine. IMPRESSION: 1. Multiple intrarenal calcifications likely reflect combination of intrarenal vascular calcification and multiple stones. Atrophic kidneys. No hydronephrosis or evidence for ureteral stone. 2. Stable chronic pleuroparenchymal disease in the bilateral lung bases 3. Cardiomegaly with post CABG changes and coronary calcifications 4. Gallstones 5. Extensive  atherosclerotic vascular disease of the aorta with stable aneurysmal dilatation of the distal infrarenal abdominal aorta 6. Stable chronic pars defect at L5 with 14 mm anterolisthesis of L5 on S1 Electronically Signed   By: Donavan Foil M.D.   On: 02/02/2017 14:49   Dg Chest Port 1 View  Result Date: 02/04/2017 CLINICAL DATA:  Hypotension EXAM: PORTABLE CHEST 1 VIEW COMPARISON:  Chest radiograph dated 02/01/2017. CT chest dated 01/08/2017. FINDINGS: Left lower lobe opacity, likely a combination of atelectasis and pleural effusion when correlating with prior CT. Small layering right pleural effusion with associated right lower lobe scarring, also unchanged from prior CT. Increased interstitial markings without frank interstitial edema. Cardiomegaly. Prostatic valve. Postsurgical changes related to prior CABG. Left subclavian pacemaker. Median sternotomy. IMPRESSION: Chronic changes in the bilateral lower lobes, unchanged from prior CT. No evidence of acute cardiopulmonary disease. Electronically Signed   By: Julian Hy M.D.   On: 02/04/2017 10:42    Scheduled Meds: . amiodarone  200 mg Oral Daily  . apixaban  5 mg Oral BID  . calcitRIOL  0.5 mcg Oral Daily  . cholecalciferol  1,000 Units Oral Daily  . docusate sodium  100 mg Oral BID  . finasteride  5 mg Oral Daily  . insulin aspart  0-20 Units Subcutaneous  TID WC  . insulin aspart  0-5 Units Subcutaneous QHS  . ipratropium  2 spray Each Nare BID  . mouth rinse  15 mL Mouth Rinse BID  . midodrine  5 mg Oral TID WC  . mometasone  1 application Topical BID  . multivitamin with minerals  1 tablet Oral Daily  . nystatin   Topical BID  . pantoprazole  40 mg Oral Q M,W,F  . rosuvastatin  10 mg Oral QHS  . triamcinolone  2 spray Nasal Daily  . valACYclovir  1,000 mg Oral Q12H   Continuous Infusions: . cefTRIAXone (ROCEPHIN) IVPB 1 gram/50 mL D5W Stopped (02/03/17 1734)    Assessment/Plan:  1. Orthostatic hypotension. Near syncope and  generalized weakness.  Start TED hose. Holding Flomax, Cozaar and Coreg. Start midodrine. 2. Acute urinary retention. Foley catheter placed and 1400 mL came out. Unfortunately need to hold Flomax at this time with orthostatic hypotension. 3. Rhonchi heard in the lungs. Chest x-ray negative for fluid. I discontinued IV fluids and give a dose of Lasix earlier today. 4. Acute on chronic kidney disease. Creatinine improved to 2.43. Hold IV fluids at this time 5. Acute cystitis with hematuria. Empiric Rocephin. Culture multiple organisms. 6. Atrial fibrillation history on Eliquis and amiodarone and Coreg. 7. BPH on Proscar. Flomax held. Foley placed for urinary retention 8. Hyperlipidemia unspecified on Crestor 9. GERD on omeprazole 10. Weakness. Physical therapy recommended home with home health couple days ago but nursing states that the patient needs 2 people to assist him up.  Code Status:     Code Status Orders        Start     Ordered   02/02/17 0153  Full code  Continuous     02/02/17 0152    Code Status History    Date Active Date Inactive Code Status Order ID Comments User Context   06/10/2016  9:09 PM 06/12/2016  3:20 PM Full Code [id number]  Idelle Crouch, MD Inpatient   10/28/2015 11:14 AM 10/29/2015  3:01 PM Full Code [id number]  Hillary Bow, MD ED    Advance Directive Documentation     Most Recent Value  Type of Advance Directive  Living will, Healthcare Power of Attorney  Pre-existing out of facility DNR order (yellow form or pink MOST form)  -  "MOST" Form in Place?  -     Family Communication: Wife Yesterday Disposition Plan: To be determined based on clinical course  Consultants:  Nephrology  Antibiotics: - Rocephin  Time spent: 26 minutes  [place], [place]

## 2017-02-05 ENCOUNTER — Institutional Professional Consult (permissible substitution): Payer: Medicare Other | Admitting: Internal Medicine

## 2017-02-05 LAB — BASIC METABOLIC PANEL
ANION GAP: 6 (ref 5–15)
BUN: 43 mg/dL — AB (ref 6–20)
CHLORIDE: 110 mmol/L (ref 101–111)
CO2: 33 mmol/L — ABNORMAL HIGH (ref 22–32)
Calcium: 9.2 mg/dL (ref 8.9–10.3)
Creatinine, Ser: 2.82 mg/dL — ABNORMAL HIGH (ref 0.61–1.24)
GFR, EST AFRICAN AMERICAN: 23 mL/min — AB (ref >60)
GFR, EST NON AFRICAN AMERICAN: 20 mL/min — AB (ref >60)
Glucose, Bld: 137 mg/dL — ABNORMAL HIGH (ref 65–99)
POTASSIUM: 3.8 mmol/L (ref 3.5–5.1)
SODIUM: 149 mmol/L — AB (ref 135–145)

## 2017-02-05 LAB — GLUCOSE, CAPILLARY
GLUCOSE-CAPILLARY: 124 mg/dL — AB (ref 65–99)
GLUCOSE-CAPILLARY: 194 mg/dL — AB (ref 65–99)
GLUCOSE-CAPILLARY: 110 mg/dL — AB (ref 65–99)
Glucose-Capillary: 130 mg/dL — ABNORMAL HIGH (ref 65–99)

## 2017-02-05 MED ORDER — SODIUM CHLORIDE 0.9% FLUSH
3.0000 mL | Freq: Two times a day (BID) | INTRAVENOUS | Status: DC
  Administered 2017-02-05 – 2017-02-07 (×4): 3 mL via INTRAVENOUS

## 2017-02-05 MED ORDER — MIDODRINE HCL 5 MG PO TABS
10.0000 mg | ORAL_TABLET | Freq: Three times a day (TID) | ORAL | Status: DC
  Administered 2017-02-05 – 2017-02-07 (×7): 10 mg via ORAL
  Filled 2017-02-05 (×8): qty 2

## 2017-02-05 MED ORDER — SALINE SPRAY 0.65 % NA SOLN
1.0000 | NASAL | Status: DC | PRN
  Administered 2017-02-05: 1 via NASAL
  Filled 2017-02-05 (×2): qty 44

## 2017-02-05 MED ORDER — PHENOL 1.4 % MT LIQD
1.0000 | OROMUCOSAL | Status: DC | PRN
  Administered 2017-02-05: 1 via OROMUCOSAL
  Filled 2017-02-05 (×2): qty 177

## 2017-02-05 MED ORDER — TRAZODONE HCL 50 MG PO TABS
50.0000 mg | ORAL_TABLET | Freq: Every day | ORAL | Status: DC
  Administered 2017-02-05 – 2017-02-06 (×2): 50 mg via ORAL
  Filled 2017-02-05 (×2): qty 1

## 2017-02-05 MED ORDER — SODIUM CHLORIDE 0.9% FLUSH: 3.0000 mL | INTRAVENOUS | Status: DC | PRN

## 2017-02-05 MED ORDER — ADALIMUMAB 40 MG/0.8ML ~~LOC~~ PSKT: 40.0000 mg | PREFILLED_SYRINGE | Freq: Once | SUBCUTANEOUS | Status: DC

## 2017-02-05 NOTE — Progress Notes (Signed)
Patient scheduled to receive 10 mg of midodrine. BP was elevated at 167/72 with a pulse of 75. Dr. Leslye Peer notified for clarification for patient to receive midodrine with this bp. He stated this is ok he would prefer his BP to bed elevated versus low due to orthostasis. Will proceed.

## 2017-02-05 NOTE — Progress Notes (Signed)
[facility]  ROUNDING NOTE   Subjective:   Wife at bedside. Foley placed.  Orthostatic hypotension with symptoms Started on midodrine. Holding home medications.   Objective:  Vital signs in last 24 hours:  Temp:  [98 F (36.7 C)-99.1 F (37.3 C)] 98.2 F (36.8 C) (06/11 1230) Pulse Rate:  [59-77] 76 (06/11 1230) Resp:  [17-19] 17 (06/11 1230) BP: (147-157)/(58-72) 157/69 (06/11 1230) SpO2:  [90 %-98 %] 97 % (06/11 1230)  Weight change:  Filed Weights   02/01/17 2124  Weight: 113.4 kg (250 lb)    Intake/Output: I/O last 3 completed shifts: In: 3241.3 [P.O.:720; I.V.:1071.3; Other:1400; IV Piggyback:50] Out: 3200 [Urine:3200]   Intake/Output this shift:  No intake/output data recorded.  Physical Exam: General: No acute distress  Head: Normocephalic, atraumatic. Moist oral mucosal membranes  Eyes: Anicteric  Neck: Supple, trachea midline  Lungs:  Clear to auscultation, normal effort  Heart: S1S2 no rubs  Abdomen:  Soft, nontender, bowel sounds present  Extremities: Trace peripheral edema.  Neurologic: Awake, alert, following commands  Skin: No lesions       Basic Metabolic Panel:  Recent Labs Lab 02/01/17 2122 02/02/17 0201 02/03/17 0447 02/04/17 0421 02/05/17 0302  NA 142 141 144 145 149*  K 3.5 3.7 3.7 4.0 3.8  CL 99* 100* 104 108 110  CO2 34* 34* 33* 33* 33*  GLUCOSE 157* 130* 159* 141* 137*  BUN 57* 54* 49* 43* 43*  CREATININE 2.93* 2.77* 2.99* 2.43* 2.82*  CALCIUM 9.6 9.2 9.0 9.1 9.2    Liver Function Tests:  Recent Labs Lab 02/01/17 2122  AST 18  ALT 18  ALKPHOS 55  BILITOT 0.6  PROT 7.0  ALBUMIN 3.2*   No results for input(s): LIPASE, AMYLASE in the last 168 hours. No results for input(s): AMMONIA in the last 168 hours.  CBC:  Recent Labs Lab 02/01/17 2122 02/02/17 0201  WBC 7.1 6.0  NEUTROABS 4.7  --   HGB 12.3* 11.3*  HCT 37.2* 34.2*  MCV 91.8 91.1  PLT 152 154    Cardiac Enzymes:  Recent Labs Lab  02/01/17 2122 02/02/17 0201 02/02/17 0751 02/02/17 1517  TROPONINI <0.03 <0.03 <0.03 <0.03    BNP: Invalid input(s): POCBNP  CBG:  Recent Labs Lab 02/04/17 1154 02/04/17 1653 02/04/17 2114 02/05/17 0728 02/05/17 1148  GLUCAP 176* 138* 131* 124* 194*    Microbiology: Results for orders placed or performed during the hospital encounter of 02/01/17  Urine culture     Status: Abnormal   Collection Time: 02/01/17 10:11 PM  Result Value Ref Range Status   Specimen Description URINE, CLEAN CATCH  Final   Special Requests Normal  Final   Culture MULTIPLE SPECIES PRESENT, SUGGEST RECOLLECTION (A)  Final   Report Status 02/03/2017 FINAL  Final    Coagulation Studies: No results for input(s): LABPROT, INR in the last 72 hours.  Urinalysis: No results for input(s): COLORURINE, LABSPEC, PHURINE, GLUCOSEU, HGBUR, BILIRUBINUR, KETONESUR, PROTEINUR, UROBILINOGEN, NITRITE, LEUKOCYTESUR in the last 72 hours.  Invalid input(s): APPERANCEUR    Imaging: Dg Chest Port 1 View  Result Date: 02/04/2017 CLINICAL DATA:  Hypotension EXAM: PORTABLE CHEST 1 VIEW COMPARISON:  Chest radiograph dated 02/01/2017. CT chest dated 01/08/2017. FINDINGS: Left lower lobe opacity, likely a combination of atelectasis and pleural effusion when correlating with prior CT. Small layering right pleural effusion with associated right lower lobe scarring, also unchanged from prior CT. Increased interstitial markings without frank interstitial edema. Cardiomegaly. Prostatic valve. Postsurgical changes related  to prior CABG. Left subclavian pacemaker. Median sternotomy. IMPRESSION: Chronic changes in the bilateral lower lobes, unchanged from prior CT. No evidence of acute cardiopulmonary disease. Electronically Signed   By: Julian Hy M.D.   On: 02/04/2017 10:42     Medications:   . cefTRIAXone (ROCEPHIN) IVPB 1 gram/50 mL D5W Stopped (02/04/17 1740)   . amiodarone  200 mg Oral Daily  . apixaban  5 mg  Oral BID  . calcitRIOL  0.5 mcg Oral Daily  . cholecalciferol  1,000 Units Oral Daily  . docusate sodium  100 mg Oral BID  . finasteride  5 mg Oral Daily  . insulin aspart  0-20 Units Subcutaneous TID WC  . insulin aspart  0-5 Units Subcutaneous QHS  . ipratropium  2 spray Each Nare BID  . mouth rinse  15 mL Mouth Rinse BID  . midodrine  10 mg Oral TID WC  . mometasone  1 application Topical BID  . multivitamin with minerals  1 tablet Oral Daily  . nystatin   Topical BID  . pantoprazole  40 mg Oral Q M,W,F  . rosuvastatin  10 mg Oral QHS  . traZODone  50 mg Oral QHS  . triamcinolone  2 spray Nasal Daily  . valACYclovir  1,000 mg Oral Q12H   acetaminophen **OR** acetaminophen, albuterol, bisacodyl, ipratropium, magnesium citrate, ondansetron **OR** ondansetron (ZOFRAN) IV, oxyCODONE, phenol, polyethylene glycol, senna-docusate, sodium chloride  Assessment/ Plan:  81 y.o.white male with hypertension, diabetes mellitus type II, hyperlipidemia, chronic diastolic heart failure, coronary disease status post CABG in 2006, abdominal aortic aneurysm repair, and permanent pacemaker placement admitted for presynocpe.  Has also had intermittent gross hematuria.   1. Chronic kidney disease stage IV with proteinuria: baseline creatinine of 2.7, GFR of 22 on 11/01/16. Chronic kidney disease is secondary to diabetes mellitus type 2 and obstructive uropathy.  - Foley catheter placed. Nonoliguric urine output.   2. Hypertension: with orthostatic symptoms. Holding home blood pressure medications - Increased midodrine.  - Holding tamsulosin.   3. Secondary hyperparathyroidism: PTH 51 on 11/01/16. Calcium at goal.  - continue calcitriol  4. Anemia chronic kidney disease: hemoglobin 11.3.   5. Intermittent gross hematuria. The patient has had a cystoscopy this past year. CT scan abdomen and pelvis performed and was negative for source of bleeding. - Follow up with urology      LOS: [place],  [place] 6/11/20181:03 PM

## 2017-02-05 NOTE — Progress Notes (Signed)
SATURATION QUALIFICATIONS: (This note is used to comply with regulatory documentation for home oxygen)  Patient Saturations on Room Air at Rest = 88%  Patient Saturations on Room Air while Ambulating = 85%  Patient Saturations on 2.5 Liters of oxygen while Ambulating = 94%  Please briefly explain why patient needs home oxygen: spo2 drops with exertion

## 2017-02-05 NOTE — Care Management (Signed)
Discussed need for home 02 assessment during progression.  Current oxygen requirement is acute.  Home health recommended home with home health but due to orthostasis, ambulation was not attempted.  Is now requiring foley for retention. Adding midodrine for orthostasis.

## 2017-02-05 NOTE — Progress Notes (Signed)
Patient ID: Marco Kohlbeck Sr., male   DOB: October 15, 1935, 81 y.o.   MRN: [id number]  [facility] PROGRESS NOTE  Marco [place] Sr. ALP:[id number] DOB: 1935/11/25 DOA: 02/01/2017 PCP: [name], MD  HPI/Subjective: Wife stated that he is more confused than usual. Patient is answering questions appropriately. He states he's feeling better and a little stronger. Always has some shortness of breath. Abdominal pain from yesterday disappeared once Foley catheter was placed.  Objective: Vitals:   02/05/17 0747 02/05/17 1230  BP: (!) 157/68 (!) 157/69  Pulse: 77 76  Resp: 19 17  Temp: 98 F (36.7 C) 98.2 F (36.8 C)    Filed Weights   02/01/17 2124  Weight: 113.4 kg (250 lb)    ROS: Review of Systems  Constitutional: Negative for chills and fever.  Eyes: Negative for blurred vision.  Respiratory: Positive for shortness of breath. Negative for cough.   Cardiovascular: Negative for chest pain.  Gastrointestinal: Negative for abdominal pain, constipation, diarrhea, nausea and vomiting.  Genitourinary: Negative for dysuria.  Musculoskeletal: Negative for joint pain.  Neurological: Negative for dizziness and headaches.   Exam: Physical Exam  Constitutional: He is oriented to person, place, and time.  HENT:  Nose: No mucosal edema.  Mouth/Throat: No oropharyngeal exudate or posterior oropharyngeal edema.  Eyes: Conjunctivae, EOM and lids are normal. Pupils are equal, round, and reactive to light.  Neck: No JVD present. Carotid bruit is not present. No edema present. No thyroid mass and no thyromegaly present.  Cardiovascular: S1 normal and S2 normal.  Exam reveals no gallop.   No murmur heard. Pulses:      Dorsalis pedis pulses are 2+ on the right side, and 2+ on the left side.  Respiratory: No respiratory distress. He has decreased breath sounds in the right lower field and the left lower field. He has wheezes in the right middle field. He has rhonchi in  the right lower field and the left lower field. He has no rales.  GI: Soft. Bowel sounds are normal. He exhibits distension. There is tenderness in the suprapubic area.  Musculoskeletal:       Right ankle: He exhibits swelling.       Left ankle: He exhibits swelling.  Lymphadenopathy:    He has no cervical adenopathy.  Neurological: He is alert and oriented to person, place, and time. No cranial nerve deficit.  Skin: Skin is warm. No rash noted. Nails show no clubbing.  Psychiatric: He has a normal mood and affect.      Data Reviewed: Basic Metabolic Panel:  Recent Labs Lab 02/01/17 2122 02/02/17 0201 02/03/17 0447 02/04/17 0421 02/05/17 0302  NA 142 141 144 145 149*  K 3.5 3.7 3.7 4.0 3.8  CL 99* 100* 104 108 110  CO2 34* 34* 33* 33* 33*  GLUCOSE 157* 130* 159* 141* 137*  BUN 57* 54* 49* 43* 43*  CREATININE 2.93* 2.77* 2.99* 2.43* 2.82*  CALCIUM 9.6 9.2 9.0 9.1 9.2   Liver Function Tests:  Recent Labs Lab 02/01/17 2122  AST 18  ALT 18  ALKPHOS 55  BILITOT 0.6  PROT 7.0  ALBUMIN 3.2*   CBC:  Recent Labs Lab 02/01/17 2122 02/02/17 0201  WBC 7.1 6.0  NEUTROABS 4.7  --   HGB 12.3* 11.3*  HCT 37.2* 34.2*  MCV 91.8 91.1  PLT 152 154   Cardiac Enzymes:  Recent Labs Lab 02/01/17 2122 02/02/17 0201 02/02/17 0751 02/02/17 1517  TROPONINI <0.03 <0.03 <0.03 <0.03  CBG:  Recent Labs Lab 02/04/17 1154 02/04/17 1653 02/04/17 2114 02/05/17 0728 02/05/17 1148  GLUCAP 176* 138* 131* 124* 194*    Recent Results (from the past 240 hour(s))  Urine culture     Status: Abnormal   Collection Time: 02/01/17 10:11 PM  Result Value Ref Range Status   Specimen Description URINE, CLEAN CATCH  Final   Special Requests Normal  Final   Culture MULTIPLE SPECIES PRESENT, SUGGEST RECOLLECTION (A)  Final   Report Status 02/03/2017 FINAL  Final     Studies: Dg Chest Port 1 View  Result Date: 02/04/2017 CLINICAL DATA:  Hypotension EXAM: PORTABLE CHEST 1 VIEW  COMPARISON:  Chest radiograph dated 02/01/2017. CT chest dated 01/08/2017. FINDINGS: Left lower lobe opacity, likely a combination of atelectasis and pleural effusion when correlating with prior CT. Small layering right pleural effusion with associated right lower lobe scarring, also unchanged from prior CT. Increased interstitial markings without frank interstitial edema. Cardiomegaly. Prostatic valve. Postsurgical changes related to prior CABG. Left subclavian pacemaker. Median sternotomy. IMPRESSION: Chronic changes in the bilateral lower lobes, unchanged from prior CT. No evidence of acute cardiopulmonary disease. Electronically Signed   By: Julian Hy M.D.   On: 02/04/2017 10:42    Scheduled Meds: . amiodarone  200 mg Oral Daily  . apixaban  5 mg Oral BID  . calcitRIOL  0.5 mcg Oral Daily  . cholecalciferol  1,000 Units Oral Daily  . docusate sodium  100 mg Oral BID  . finasteride  5 mg Oral Daily  . insulin aspart  0-20 Units Subcutaneous TID WC  . insulin aspart  0-5 Units Subcutaneous QHS  . ipratropium  2 spray Each Nare BID  . mouth rinse  15 mL Mouth Rinse BID  . midodrine  10 mg Oral TID WC  . mometasone  1 application Topical BID  . multivitamin with minerals  1 tablet Oral Daily  . nystatin   Topical BID  . pantoprazole  40 mg Oral Q M,W,F  . rosuvastatin  10 mg Oral QHS  . traZODone  50 mg Oral QHS  . triamcinolone  2 spray Nasal Daily  . valACYclovir  1,000 mg Oral Q12H    Assessment/Plan:  1. Orthostatic hypotension. Near syncope and generalized weakness.  Continue TED hose. Holding Flomax, Cozaar and Coreg. Increase midodrine to 10 mg 3 times a day. 2. Acute urinary retention. Foley catheter placed yesterday and 1400 mL came out. Unfortunately need to hold Flomax at this time with orthostatic hypotension.  Continue Proscar 3. Acute on chronic kidney disease. Creatinine worsened to 2.8. Hold IV fluids at this time, hold diuretics at this time 4. Acute cystitis  with hematuria. Culture multiple organisms, so antibiotics will be discontinued 5. Atrial fibrillation history on Eliquis and amiodarone and Coreg. 6. BPH on Proscar. Flomax held. Foley placed for urinary retention 7. Hyperlipidemia unspecified on Crestor 8. GERD on omeprazole 9. Weakness. Physical therapy recommended home with home health couple days ago but nursing states that the patient needs 2 people to assist him up. Physical therapy reevaluation needed  Code Status:     Code Status Orders        Start     Ordered   02/02/17 0153  Full code  Continuous     02/02/17 0152    Code Status History    Date Active Date Inactive Code Status Order ID Comments User Context   06/10/2016  9:09 PM 06/12/2016  3:20 PM Full Code [id number]  Idelle Crouch, MD Inpatient   10/28/2015 11:14 AM 10/29/2015  3:01 PM Full Code [id number]  Hillary Bow, MD ED    Advance Directive Documentation     Most Recent Value  Type of Advance Directive  Living will, Healthcare Power of Attorney  Pre-existing out of facility DNR order (yellow form or pink MOST form)  -  "MOST" Form in Place?  -     Family Communication: Wife Today at the bedside Disposition Plan: Hopefully tomorrow patient will be less orthostatic  Consultants:  Nephrology  Time spent: 24 minutes  Loletha Grayer  [employer]

## 2017-02-05 NOTE — Progress Notes (Signed)
Physical Therapy Treatment Patient Details Name: Marco Stahnke Sr. MRN: [id number] DOB: [birth year] Today's Date: 02/05/2017    History of Present Illness Pt is a 81 y/o M who presented for evaluation of syncope and generalized weakness.  Orthostatics have been positive in the hospital. Pt found to have UTI. Pt's PMH includes AAA and repair, a-fib/flutter, chronic systolic heart failure, CKD, DVT, MI, obesity, PVC, CABG.    PT Comments    Pt agreeable to PT. Denies pain. Pt notes blood pressure continues to be low. Agreeable to seated exercises. Participates with seated exercises with rest periods needed between sets to manage mild dizziness with sit. Pt requires assist up in and returned to bed and manages repositioning upward with use of trapeze. Pt winded post session with O2 saturation at 90%. Encouraged deep pursed lip breathing. Continue PT to progress strength, endurance and tolerance upright position to improve function.    Follow Up Recommendations  Home health PT     Equipment Recommendations       Recommendations for Other Services       Precautions / Restrictions Precautions Precautions: Fall;Other (comment) Restrictions Weight Bearing Restrictions: No    Mobility  Bed Mobility Overal bed mobility: Needs Assistance Bed Mobility: Supine to Sit;Sit to Supine     Supine to sit: Mod assist;HOB elevated Sit to supine: Mod assist   General bed mobility comments: assist with trunk; increased time to accomplish. Assist for LEs as well returning to supine  Transfers                    Ambulation/Gait                 Stairs            Wheelchair Mobility    Modified Rankin (Stroke Patients Only)       Balance Overall balance assessment: Needs assistance Sitting-balance support: Bilateral upper extremity supported;Feet supported Sitting balance-Leahy Scale: Good                                      Cognition  Arousal/Alertness: Awake/alert Behavior During Therapy: WFL for tasks assessed/performed Overall Cognitive Status: Within Functional Limits for tasks assessed                                        Exercises General Exercises - Lower Extremity Long Arc Quad: AROM;Both;10 reps;Seated (2 sets) Hip ABduction/ADduction: AROM;Both;10 reps;Seated (2 sets) Hip Flexion/Marching: AROM;Both;10 reps;Seated (2 sets) Toe Raises: AROM;Both;20 reps;Seated Heel Raises: AROM;Both;20 reps;Seated    General Comments        Pertinent Vitals/Pain Pain Assessment: No/denies pain    Home Living                      Prior Function            PT Goals (current goals can now be found in the care plan section) Progress towards PT goals: Progressing toward goals    Frequency    Min 2X/week      PT Plan Current plan remains appropriate (unless unable to ambulate; hope to improve with BP improved)    Co-evaluation              AM-PAC PT "6 Clicks" Daily Activity  Outcome Measure  Difficulty turning over in bed (including adjusting bedclothes, sheets and blankets)?: Total Difficulty moving from lying on back to sitting on the side of the bed? : Total Difficulty sitting down on and standing up from a chair with arms (e.g., wheelchair, bedside commode, etc,.)?: Total Help needed moving to and from a bed to chair (including a wheelchair)?: A Lot Help needed walking in hospital room?: A Lot Help needed climbing 3-5 steps with a railing? : A Lot 6 Click Score: 9    End of Session Equipment Utilized During Treatment: Oxygen Activity Tolerance: Other (comment) (mild dizziness wth seated exercises that rest helps) Patient left: in bed;with call bell/phone within reach;with bed alarm set   PT Visit Diagnosis: History of falling (Z91.81);Unsteadiness on feet (R26.81)     Time: 6286-3817 PT Time Calculation (min) (ACUTE ONLY): 24 min  Charges:  $Therapeutic  Exercise: 23-37 mins                    G Codes:        Larae Grooms, PTA 02/05/2017, 3:19 PM

## 2017-02-06 ENCOUNTER — Encounter: Payer: Self-pay | Admitting: Nurse Practitioner

## 2017-02-06 DIAGNOSIS — R55 Syncope and collapse: Secondary | ICD-10-CM

## 2017-02-06 DIAGNOSIS — I2510 Atherosclerotic heart disease of native coronary artery without angina pectoris: Secondary | ICD-10-CM

## 2017-02-06 DIAGNOSIS — I4891 Unspecified atrial fibrillation: Secondary | ICD-10-CM

## 2017-02-06 DIAGNOSIS — I495 Sick sinus syndrome: Secondary | ICD-10-CM

## 2017-02-06 DIAGNOSIS — I5022 Chronic systolic (congestive) heart failure: Secondary | ICD-10-CM

## 2017-02-06 LAB — GLUCOSE, CAPILLARY
GLUCOSE-CAPILLARY: 164 mg/dL — AB (ref 65–99)
Glucose-Capillary: 113 mg/dL — ABNORMAL HIGH (ref 65–99)
Glucose-Capillary: 148 mg/dL — ABNORMAL HIGH (ref 65–99)
Glucose-Capillary: 136 mg/dL — ABNORMAL HIGH (ref 65–99)

## 2017-02-06 MED ORDER — FLUDROCORTISONE ACETATE 0.1 MG PO TABS
0.2000 mg | ORAL_TABLET | Freq: Every day | ORAL | Status: DC
  Administered 2017-02-06 – 2017-02-07 (×2): 0.2 mg via ORAL
  Filled 2017-02-06 (×2): qty 2

## 2017-02-06 MED ORDER — ADULT MULTIVITAMIN W/MINERALS CH
ORAL_TABLET | ORAL | Status: AC
  Filled 2017-02-06: qty 1

## 2017-02-06 MED ORDER — DOCUSATE SODIUM 100 MG PO CAPS
ORAL_CAPSULE | ORAL | Status: AC
  Filled 2017-02-06: qty 1

## 2017-02-06 MED ORDER — AMIODARONE HCL 200 MG PO TABS
ORAL_TABLET | ORAL | Status: AC
  Filled 2017-02-06: qty 1

## 2017-02-06 MED ORDER — FINASTERIDE 5 MG PO TABS
ORAL_TABLET | ORAL | Status: AC
  Filled 2017-02-06: qty 1

## 2017-02-06 MED ORDER — APIXABAN 5 MG PO TABS
ORAL_TABLET | ORAL | Status: AC
  Filled 2017-02-06: qty 1

## 2017-02-06 MED ORDER — VITAMIN D 1000 UNITS PO TABS
ORAL_TABLET | ORAL | Status: AC
  Filled 2017-02-06: qty 1

## 2017-02-06 NOTE — NC FL2 (Signed)
[place] LEVEL OF CARE SCREENING TOOL     IDENTIFICATION  Patient Name: Marco Matlock Sr. Birthdate: 1935/10/29 Sex: male Admission Date (Current Location): 02/01/2017  [place] and [place] Number:  Engineering geologist and Address:  [facility], [address] [place], [place] [address]      Provider Number: [id number]  Attending Physician Name and Address:  Loletha Grayer, MD  Relative Name and Phone Number:  Teren, [place] [phone]  [phone]     Current Level of Care: Hospital Recommended Level of Care: [place] Prior Approval Number:    Date Approved/Denied:   PASRR Number: [id number]  Discharge Plan: SNF    Current Diagnoses: Patient Active Problem List   Diagnosis Date Noted  . Near syncope 02/02/2017  . Pressure injury of skin 02/02/2017  . Orthostasis 12/04/2016  . LGI bleed 06/10/2016  . Acute blood loss anemia 06/10/2016  . Chronic respiratory failure with hypoxia, on home O2 therapy ([place]) 06/10/2016  . PAD (peripheral artery disease) ([place]) 06/05/2016  . Chest pain 10/28/2015  . Gastroenteritis   . Diarrhea of infectious origin   . Emesis   . S/P CABG (coronary artery bypass graft)   . Coronary artery disease involving native coronary artery of native heart without angina pectoris   . PAF (paroxysmal atrial fibrillation) ([place])   . Absolute anemia 01/28/2015  . Atherosclerosis of coronary artery 01/28/2015  . Barrett's esophagus 01/28/2015  . Basal cell carcinoma of face 01/28/2015  . Benign fibroma of prostate 01/28/2015  . CCF (congestive cardiac failure) ([place]) 01/28/2015  . Diabetes ([place]) 01/28/2015  . Gastro-esophageal reflux disease without esophagitis 01/28/2015  . Irritable colon 01/28/2015  . Cannot sleep 01/28/2015  . Pleural cavity effusion 01/28/2015  . Apnea, sleep 01/28/2015  . Hematuria 12/21/2014  . Left hand weakness 09/23/2013  . Essential  hypertension 06/23/2013  . Chronic systolic heart failure ([place]) 06/10/2013  . Hypotension, postural 06/10/2013  . Hyperkalemia 06/10/2013  . Adjustment of cardiac pacemaker 06/10/2013  . Atrial fibrillation and flutter ([place]) 10/22/2012  . Pacemaker-Medtronic 06/19/2012  . Pulmonary hypertension ([place]) 03/18/2012  . Occlusion and stenosis of carotid artery without mention of cerebral infarction 02/08/2012  . Morbid obesity ([place]) 08/05/2010  . Sinoatrial node dysfunction ([facility]) 06/02/2010  . Stage 4 chronic renal impairment associated with type 2 diabetes mellitus ([place]) 08/09/2009  . AAA 04/21/2009  . Ischemic cardiomyopathy  s/p CABG 11/23/2008  . MITRAL REGURGITATION 11/22/2008  . CAD (coronary artery disease) 11/22/2008  . Hyperlipidemia 03/02/2008  . ALLERGIC RHINITIS 03/02/2008  . OSA (obstructive sleep apnea) 03/02/2008    Orientation RESPIRATION BLADDER Height & Weight     Self, Time, Situation, Place  O2 (2L) Indwelling catheter Weight: 250 lb (113.4 kg) Height:  6' (182.9 cm)  BEHAVIORAL SYMPTOMS/MOOD NEUROLOGICAL BOWEL NUTRITION STATUS      Continent Diet (Carb Modied)  AMBULATORY STATUS COMMUNICATION OF NEEDS Skin   Limited Assist Verbally PU Stage and Appropriate Care   PU Stage 2 Dressing: Daily                   Personal Care Assistance Level of Assistance  Bathing, Feeding, Dressing Bathing Assistance: Limited assistance Feeding assistance: Independent Dressing Assistance: Limited assistance     Functional Limitations Info  Sight, Hearing, Speech Sight Info: Adequate Hearing Info: Adequate Speech Info: Adequate    SPECIAL CARE FACTORS FREQUENCY  PT (By licensed PT)     PT Frequency: 5x a week  Contractures Contractures Info: Not present    Additional Factors Info  Code Status, Allergies, Insulin Sliding Scale Code Status Info: Full Code Allergies Info: [place] Allergy   Insulin Sliding Scale Info: insulin aspart (novoLOG)  injection 0-20 Units 3x a day with meals       Current Medications (02/06/2017):  This is the current hospital active medication list Current Facility-Administered Medications  Medication Dose Route Frequency Provider Last Rate Last Dose  . acetaminophen (TYLENOL) tablet 650 mg  650 mg Oral Q6H PRN Hugelmeyer, Alexis, DO   650 mg at 02/04/17 0601   Or  . acetaminophen (TYLENOL) suppository 650 mg  650 mg Rectal Q6H PRN Hugelmeyer, Alexis, DO      . albuterol (PROVENTIL) (2.5 MG/3ML) 0.083% nebulizer solution 2.5 mg  2.5 mg Nebulization Q6H PRN Hugelmeyer, Alexis, DO   2.5 mg at 02/05/17 0753  . amiodarone (PACERONE) 200 MG tablet           . amiodarone (PACERONE) tablet 200 mg  200 mg Oral Daily Hugelmeyer, Alexis, DO   200 mg at 02/06/17 0853  . apixaban (ELIQUIS) 5 MG tablet           . apixaban (ELIQUIS) tablet 5 mg  5 mg Oral BID Hugelmeyer, Alexis, DO   5 mg at 02/06/17 0853  . bisacodyl (DULCOLAX) EC tablet 5 mg  5 mg Oral Daily PRN Hugelmeyer, Alexis, DO      . calcitRIOL (ROCALTROL) capsule 0.5 mcg  0.5 mcg Oral Daily Hugelmeyer, Alexis, DO   0.5 mcg at 02/06/17 0936  . cholecalciferol (VITAMIN D) 1000 units tablet           . cholecalciferol (VITAMIN D) tablet 1,000 Units  1,000 Units Oral Daily Hugelmeyer, Alexis, DO   1,000 Units at 02/06/17 0853  . docusate sodium (COLACE) 100 MG capsule           . docusate sodium (COLACE) capsule 100 mg  100 mg Oral BID Hugelmeyer, Alexis, DO   100 mg at 02/06/17 0853  . finasteride (PROSCAR) 5 MG tablet           . finasteride (PROSCAR) tablet 5 mg  5 mg Oral Daily Hugelmeyer, Alexis, DO   5 mg at 02/06/17 0853  . fludrocortisone (FLORINEF) tablet 0.2 mg  0.2 mg Oral Daily Kolluru, Sarath, MD      . insulin aspart (novoLOG) injection 0-20 Units  0-20 Units Subcutaneous TID WC Hugelmeyer, Alexis, DO   3 Units at 02/05/17 1731  . insulin aspart (novoLOG) injection 0-5 Units  0-5 Units Subcutaneous QHS Hugelmeyer, Alexis, DO   2 Units at 02/02/17  2039  . ipratropium (ATROVENT) 0.06 % nasal spray 2 spray  2 spray Each Nare BID Loletha Grayer, MD   2 spray at 02/06/17 1027  . ipratropium (ATROVENT) nebulizer solution 0.5 mg  0.5 mg Nebulization Q6H PRN Hugelmeyer, Alexis, DO      . magnesium citrate solution 1 Bottle  1 Bottle Oral Once PRN Hugelmeyer, Alexis, DO      . MEDLINE mouth rinse  15 mL Mouth Rinse BID Hugelmeyer, Alexis, DO   15 mL at 02/06/17 0854  . midodrine (PROAMATINE) tablet 10 mg  10 mg Oral TID WC Loletha Grayer, MD   10 mg at 02/06/17 0935  . mometasone (ELOCON) 0.1 % cream 1 application  1 application Topical BID Hugelmeyer, Alexis, DO   1 application at 25/36/64 0936  . multivitamin with minerals tablet 1 tablet  1 tablet Oral  Daily Hugelmeyer, Alexis, DO   1 tablet at 02/06/17 0854  . multivitamin with minerals tablet           . nystatin (MYCOSTATIN/NYSTOP) topical powder   Topical BID Wieting, Richard, MD      . ondansetron [facility]) tablet 4 mg  4 mg Oral Q6H PRN Hugelmeyer, Alexis, DO       Or  . ondansetron (ZOFRAN) injection 4 mg  4 mg Intravenous Q6H PRN Hugelmeyer, Alexis, DO      . oxyCODONE (Oxy IR/ROXICODONE) immediate release tablet 5 mg  5 mg Oral Q4H PRN Hugelmeyer, Alexis, DO      . pantoprazole (PROTONIX) EC tablet 40 mg  40 mg Oral Q M,W,F Hugelmeyer, Alexis, DO   40 mg at 02/05/17 0840  . phenol (CHLORASEPTIC) mouth spray 1 spray  1 spray Mouth/Throat PRN Loletha Grayer, MD   1 spray at 02/05/17 0225  . polyethylene glycol (MIRALAX / GLYCOLAX) packet 17 g  17 g Oral Daily PRN Hugelmeyer, Alexis, DO      . rosuvastatin (CRESTOR) tablet 10 mg  10 mg Oral QHS Henreitta Leber, MD   10 mg at 02/05/17 2149  . senna-docusate (Senokot-S) tablet 1 tablet  1 tablet Oral QHS PRN Hugelmeyer, Alexis, DO   1 tablet at 02/04/17 2113  . sodium chloride (OCEAN) 0.65 % nasal spray 1 spray  1 spray Each Nare PRN Loletha Grayer, MD   1 spray at 02/05/17 0225  . sodium chloride flush (NS) 0.9 % injection 3 mL  3  mL Intravenous Q12H Loletha Grayer, MD   3 mL at 02/06/17 0859  . sodium chloride flush (NS) 0.9 % injection 3 mL  3 mL Intravenous PRN Wieting, Richard, MD      . traZODone (DESYREL) tablet 50 mg  50 mg Oral QHS Loletha Grayer, MD   50 mg at 02/05/17 2149  . triamcinolone (NASACORT) nasal inhaler 2 spray  2 spray Nasal Daily Hugelmeyer, Alexis, DO   2 spray at 02/06/17 0852  . valACYclovir (VALTREX) tablet 1,000 mg  1,000 mg Oral Q12H Loletha Grayer, MD   1,000 mg at 02/06/17 6440     Discharge Medications: Please see discharge summary for a list of discharge medications.  Relevant Imaging Results:  Relevant Lab Results:   Additional Information SSN [id number]  Ross Ludwig, [place]

## 2017-02-06 NOTE — Progress Notes (Signed)
[facility]  ROUNDING NOTE   Subjective:   Wife at bedside.  Too dizzy to stand up for orthostatic vital signs  No labs today  Objective:  Vital signs in last 24 hours:  Temp:  [98.2 F (36.8 C)-98.7 F (37.1 C)] 98.4 F (36.9 C) (06/12 0749) Pulse Rate:  [60-78] 78 (06/12 1016) Resp:  [17-18] 17 (06/12 0749) BP: (103-167)/(51-72) 103/55 (06/12 1016) SpO2:  [92 %-98 %] 95 % (06/12 0749)  Weight change:  Filed Weights   02/01/17 2124  Weight: 113.4 kg (250 lb)    Intake/Output: I/O last 3 completed shifts: In: 480 [P.O.:480] Out: 1650 [Urine:1650]   Intake/Output this shift:  Total I/O In: 243 [P.O.:240; I.V.:3] Out: -   Physical Exam: General: No acute distress  Head: Normocephalic, atraumatic. Moist oral mucosal membranes  Eyes: Anicteric  Neck: Supple, trachea midline  Lungs:  Clear to auscultation, normal effort  Heart: S1S2 no rubs  Abdomen:  Soft, nontender, bowel sounds present  Extremities: Trace peripheral edema.  Neurologic: Awake, alert, following commands  Skin: No lesions       Basic Metabolic Panel:  Recent Labs Lab 02/01/17 2122 02/02/17 0201 02/03/17 0447 02/04/17 0421 02/05/17 0302  NA 142 141 144 145 149*  K 3.5 3.7 3.7 4.0 3.8  CL 99* 100* 104 108 110  CO2 34* 34* 33* 33* 33*  GLUCOSE 157* 130* 159* 141* 137*  BUN 57* 54* 49* 43* 43*  CREATININE 2.93* 2.77* 2.99* 2.43* 2.82*  CALCIUM 9.6 9.2 9.0 9.1 9.2    Liver Function Tests:  Recent Labs Lab 02/01/17 2122  AST 18  ALT 18  ALKPHOS 55  BILITOT 0.6  PROT 7.0  ALBUMIN 3.2*   No results for input(s): LIPASE, AMYLASE in the last 168 hours. No results for input(s): AMMONIA in the last 168 hours.  CBC:  Recent Labs Lab 02/01/17 2122 02/02/17 0201  WBC 7.1 6.0  NEUTROABS 4.7  --   HGB 12.3* 11.3*  HCT 37.2* 34.2*  MCV 91.8 91.1  PLT 152 154    Cardiac Enzymes:  Recent Labs Lab 02/01/17 2122 02/02/17 0201 02/02/17 0751 02/02/17 1517   TROPONINI <0.03 <0.03 <0.03 <0.03    BNP: Invalid input(s): POCBNP  CBG:  Recent Labs Lab 02/05/17 0728 02/05/17 1148 02/05/17 1648 02/05/17 2113 02/06/17 0734  GLUCAP 124* 194* 130* 110* 113*    Microbiology: Results for orders placed or performed during the hospital encounter of 02/01/17  Urine culture     Status: Abnormal   Collection Time: 02/01/17 10:11 PM  Result Value Ref Range Status   Specimen Description URINE, CLEAN CATCH  Final   Special Requests Normal  Final   Culture MULTIPLE SPECIES PRESENT, SUGGEST RECOLLECTION (A)  Final   Report Status 02/03/2017 FINAL  Final    Coagulation Studies: No results for input(s): LABPROT, INR in the last 72 hours.  Urinalysis: No results for input(s): COLORURINE, LABSPEC, PHURINE, GLUCOSEU, HGBUR, BILIRUBINUR, KETONESUR, PROTEINUR, UROBILINOGEN, NITRITE, LEUKOCYTESUR in the last 72 hours.  Invalid input(s): APPERANCEUR    Imaging: No results found.   Medications:    . amiodarone      . amiodarone  200 mg Oral Daily  . apixaban      . apixaban  5 mg Oral BID  . calcitRIOL  0.5 mcg Oral Daily  . cholecalciferol      . cholecalciferol  1,000 Units Oral Daily  . docusate sodium      . docusate sodium  100 mg Oral BID  . finasteride      . finasteride  5 mg Oral Daily  . fludrocortisone  0.2 mg Oral Daily  . insulin aspart  0-20 Units Subcutaneous TID WC  . insulin aspart  0-5 Units Subcutaneous QHS  . ipratropium  2 spray Each Nare BID  . mouth rinse  15 mL Mouth Rinse BID  . midodrine  10 mg Oral TID WC  . mometasone  1 application Topical BID  . multivitamin with minerals  1 tablet Oral Daily  . multivitamin with minerals      . nystatin   Topical BID  . pantoprazole  40 mg Oral Q M,W,F  . rosuvastatin  10 mg Oral QHS  . sodium chloride flush  3 mL Intravenous Q12H  . traZODone  50 mg Oral QHS  . triamcinolone  2 spray Nasal Daily  . valACYclovir  1,000 mg Oral Q12H   acetaminophen **OR**  acetaminophen, albuterol, bisacodyl, ipratropium, magnesium citrate, ondansetron **OR** ondansetron (ZOFRAN) IV, oxyCODONE, phenol, polyethylene glycol, senna-docusate, sodium chloride, sodium chloride flush  Assessment/ Plan:  81 y.o.white male with hypertension, diabetes mellitus type II, hyperlipidemia, chronic diastolic heart failure, coronary disease status post CABG in 2006, abdominal aortic aneurysm repair, and permanent pacemaker placement admitted for presynocpe.  Has also had intermittent gross hematuria.   1. Chronic kidney disease stage IV with proteinuria: baseline creatinine of 2.7, GFR of 22 on 11/01/16. Chronic kidney disease is secondary to diabetes mellitus type 2 and obstructive uropathy.  - Foley catheter placed. Nonoliguric urine output.  - labs for tomorrow.   2. Hypertension: with orthostatic symptoms. Holding home blood pressure medications - Increased midodrine.  - Holding tamsulosin.  - Start fludrocortisone - Consult cardiology for presyncope, congestive heart failure and hypotension  3. Secondary hyperparathyroidism: PTH 51 on 11/01/16. Calcium at goal.  - continue calcitriol  4. Anemia chronic kidney disease: hemoglobin 11.3.   5. Intermittent gross hematuria. Now with foley catheter.  - Follow up with urology  6. Diabetes mellitus type II with chronic kidney disease: concern for autonomic neuropathy - continue glucose control     LOS: [place], [place] 6/12/201811:19 AM

## 2017-02-06 NOTE — Plan of Care (Signed)
Problem: Safety: Goal: Ability to remain free from injury will improve Outcome: Progressing Fall precautions in place, non skid socks when oob  Problem: Skin Integrity: Goal: Risk for impaired skin integrity will decrease Outcome: Not Progressing MASD to groin, dressing change to buttocks bid  Problem: Activity: Goal: Risk for activity intolerance will decrease Outcome: Progressing Working with PT  Problem: Urinary Elimination: Goal: Signs and symptoms of infection will decrease Outcome: Not Progressing Remains on IV antibiotics

## 2017-02-06 NOTE — Progress Notes (Signed)
Patient ID: Marco Kauffman Sr., male   DOB: 18-Jan-1936, 81 y.o.   MRN: [id number]  [facility] PROGRESS NOTE  Marco Eble Sterling Sr. LOV:[id number] DOB: [birth year] DOA: 02/01/2017 PCP: [name], MD  HPI/Subjective: Patient feels weak. States he would be interested in rehabilitation. Blood pressure still dropping down when he stood up. The nurse this morning he fell back into the bed when he stood up. Patient unable to sit up in the bed for me for me to listen to his lungs.  Objective: Vitals:   02/06/17 1016 02/06/17 1414  BP: (!) 103/55 (!) 119/54  Pulse: 78 75  Resp:  16  Temp:  98.5 F (36.9 C)    Filed Weights   02/01/17 2124 02/06/17 1300  Weight: 113.4 kg (250 lb) 112.4 kg (247 lb 14.4 oz)    ROS: Review of Systems  Constitutional: Negative for chills and fever.  Eyes: Negative for blurred vision.  Respiratory: Positive for shortness of breath. Negative for cough.   Cardiovascular: Negative for chest pain.  Gastrointestinal: Negative for abdominal pain, constipation, diarrhea, nausea and vomiting.  Genitourinary: Negative for dysuria.  Musculoskeletal: Negative for joint pain.  Neurological: Negative for dizziness and headaches.   Exam: Physical Exam  Constitutional: He is oriented to person, place, and time.  HENT:  Nose: No mucosal edema.  Mouth/Throat: No oropharyngeal exudate or posterior oropharyngeal edema.  Eyes: Conjunctivae, EOM and lids are normal. Pupils are equal, round, and reactive to light.  Neck: No JVD present. Carotid bruit is not present. No edema present. No thyroid mass and no thyromegaly present.  Cardiovascular: S1 normal and S2 normal.  Exam reveals no gallop.   No murmur heard. Pulses:      Dorsalis pedis pulses are 2+ on the right side, and 2+ on the left side.  Respiratory: No respiratory distress. He has decreased breath sounds in the right lower field and the left lower field. He has wheezes in the right  middle field. He has rhonchi in the right lower field and the left lower field. He has no rales.  GI: Soft. Bowel sounds are normal. He exhibits distension. There is tenderness in the suprapubic area.  Musculoskeletal:       Right ankle: He exhibits swelling.       Left ankle: He exhibits swelling.  Lymphadenopathy:    He has no cervical adenopathy.  Neurological: He is alert and oriented to person, place, and time. No cranial nerve deficit.  Skin: Skin is warm. No rash noted. Nails show no clubbing.  Psychiatric: He has a normal mood and affect.      Data Reviewed: Basic Metabolic Panel:  Recent Labs Lab 02/01/17 2122 02/02/17 0201 02/03/17 0447 02/04/17 0421 02/05/17 0302  NA 142 141 144 145 149*  K 3.5 3.7 3.7 4.0 3.8  CL 99* 100* 104 108 110  CO2 34* 34* 33* 33* 33*  GLUCOSE 157* 130* 159* 141* 137*  BUN 57* 54* 49* 43* 43*  CREATININE 2.93* 2.77* 2.99* 2.43* 2.82*  CALCIUM 9.6 9.2 9.0 9.1 9.2   Liver Function Tests:  Recent Labs Lab 02/01/17 2122  AST 18  ALT 18  ALKPHOS 55  BILITOT 0.6  PROT 7.0  ALBUMIN 3.2*   CBC:  Recent Labs Lab 02/01/17 2122 02/02/17 0201  WBC 7.1 6.0  NEUTROABS 4.7  --   HGB 12.3* 11.3*  HCT 37.2* 34.2*  MCV 91.8 91.1  PLT 152 154   Cardiac Enzymes:  Recent  Labs Lab 02/01/17 2122 02/02/17 0201 02/02/17 0751 02/02/17 1517  TROPONINI <0.03 <0.03 <0.03 <0.03    CBG:  Recent Labs Lab 02/05/17 1148 02/05/17 1648 02/05/17 2113 02/06/17 0734 02/06/17 1221  GLUCAP 194* 130* 110* 113* 148*    Recent Results (from the past 240 hour(s))  Urine culture     Status: Abnormal   Collection Time: 02/01/17 10:11 PM  Result Value Ref Range Status   Specimen Description URINE, CLEAN CATCH  Final   Special Requests Normal  Final   Culture MULTIPLE SPECIES PRESENT, SUGGEST RECOLLECTION (A)  Final   Report Status 02/03/2017 FINAL  Final     Scheduled Meds: . amiodarone  200 mg Oral Daily  . apixaban  5 mg Oral BID  .  calcitRIOL  0.5 mcg Oral Daily  . cholecalciferol  1,000 Units Oral Daily  . docusate sodium  100 mg Oral BID  . finasteride  5 mg Oral Daily  . fludrocortisone  0.2 mg Oral Daily  . insulin aspart  0-20 Units Subcutaneous TID WC  . insulin aspart  0-5 Units Subcutaneous QHS  . ipratropium  2 spray Each Nare BID  . mouth rinse  15 mL Mouth Rinse BID  . midodrine  10 mg Oral TID WC  . mometasone  1 application Topical BID  . multivitamin with minerals  1 tablet Oral Daily  . nystatin   Topical BID  . pantoprazole  40 mg Oral Q M,W,F  . rosuvastatin  10 mg Oral QHS  . sodium chloride flush  3 mL Intravenous Q12H  . traZODone  50 mg Oral QHS  . triamcinolone  2 spray Nasal Daily  . valACYclovir  1,000 mg Oral Q12H    Assessment/Plan:  1. Orthostatic hypotension. Near syncope and generalized weakness.  Continue TED hose. Holding Flomax, Cozaar and Coreg. Increased midodrine to 10 mg 3 times a day yesterday. Added Florinef today 2. Acute urinary retention. Foley catheter placed 2 days ago and 1400 mL came out. Unfortunately need to hold Flomax at this time with orthostatic hypotension.  Continue Proscar. 3. Acute on chronic kidney disease. Creatinine worsened to 2.8. Hold IV fluids at this time, hold diuretics at this time 4. Acute cystitis with hematuria. Culture multiple organisms, so antibiotics will be discontinued 5. Atrial fibrillation history on Eliquis and amiodarone and Coreg. 6. BPH on Proscar. Flomax held. Foley placed for urinary retention 7. Hyperlipidemia unspecified on Crestor 8. GERD on omeprazole 9. Weakness. Patient interested in rehabilitation if needed. Wife would like to have him go to rehabilitation.  Code Status:     Code Status Orders        Start     Ordered   02/02/17 0153  Full code  Continuous     02/02/17 0152    Code Status History    Date Active Date Inactive Code Status Order ID Comments User Context   06/10/2016  9:09 PM 06/12/2016  3:20 PM  Full Code [id number]  Idelle Crouch, MD Inpatient   10/28/2015 11:14 AM 10/29/2015  3:01 PM Full Code [id number]  Hillary Bow, MD ED    Advance Directive Documentation     Most Recent Value  Type of Advance Directive  Living will, Healthcare Power of Attorney  Pre-existing out of facility DNR order (yellow form or pink MOST form)  -  "MOST" Form in Place?  -     Family Communication: Wife Today at the bedside Disposition Plan: Hopefully can get to rehabilitation in  the next few days depending on blood pressure.  Consultants:  Nephrology  Time spent: 24 minutes  [place], [place]

## 2017-02-06 NOTE — Clinical Social Work Note (Signed)
CSW presented bed offers to patient and his wife, they chose [facility] [place] [facility].  CSW contacted [facility] and they can accept patient once he is medically ready for discharge.  CSW to continue to follow patient's progress throughout discharge planning.  Jones Broom. [place], MSW, [place]  02/06/2017 4:03 PM

## 2017-02-06 NOTE — Plan of Care (Signed)
Problem: Skin Integrity: Goal: Risk for impaired skin integrity will decrease Outcome: Not Progressing Patient declined to hourly repositioning, explained to patient about risk for pressure ulcer.

## 2017-02-06 NOTE — Progress Notes (Signed)
Physical Therapy Treatment Patient Details Name: Marco Belcher Sr. MRN: [id number] DOB: 03/23/1936 Today's Date: 02/06/2017    History of Present Illness Pt is a 81 y/o M who presented for evaluation of syncope and generalized weakness.  Orthostatics have been positive in the hospital. Pt found to have UTI. Pt's PMH includes AAA and repair, a-fib/flutter, chronic systolic heart failure, CKD, DVT, MI, obesity, PVC, CABG.    PT Comments    Pt agreeable to PT; denies pain. Continues with fatigue, weakness and some shortness of breath. Pt continues to require Mod A for bed mobility supine to/from sit. Orthostatic BP's taken, but pt unable to tolerate standing x 3 minutes for final reading. Pt does experience decrease in BP to 107/55 in stand with progressing weakness, but denies dizziness this session. Pt fatigued at this point and refuses further attempts at stand, mobility or up in the chair. Continue PT to progress upright tolerance, strength and endurance to improve functional mobility. Spoke with SW regarding discharge recommendations.    Follow Up Recommendations  SNF     Equipment Recommendations  Rolling walker with 5" wheels    Recommendations for Other Services       Precautions / Restrictions Precautions Precautions: Fall;Other (comment) Restrictions Weight Bearing Restrictions: No    Mobility  Bed Mobility Overal bed mobility: Needs Assistance Bed Mobility: Supine to Sit;Sit to Supine     Supine to sit: Mod assist;HOB elevated Sit to supine: Mod assist   General bed mobility comments: assist with trunk; increased time to accomplish. Assist for LEs as well returning to supine  Transfers Overall transfer level: Needs assistance Equipment used: Rolling walker (2 wheeled) Transfers: Sit to/from Stand Sit to Stand: Min assist         General transfer comment: Several attempts; rocking motion to initiate  Ambulation/Gait             General Gait  Details: pt refused any attempt. Tolerated stand for orthostatic BP's    Stairs            Wheelchair Mobility    Modified Rankin (Stroke Patients Only)       Balance Overall balance assessment: Needs assistance Sitting-balance support: Bilateral upper extremity supported;Feet supported Sitting balance-Leahy Scale: Good     Standing balance support: Bilateral upper extremity supported Standing balance-Leahy Scale: Fair                              Cognition Arousal/Alertness: Awake/alert Behavior During Therapy: WFL for tasks assessed/performed Overall Cognitive Status: Within Functional Limits for tasks assessed                                        Exercises      General Comments        Pertinent Vitals/Pain Pain Assessment: No/denies pain    Home Living                      Prior Function            PT Goals (current goals can now be found in the care plan section) Progress towards PT goals: Progressing toward goals (slowly)    Frequency    Min 2X/week      PT Plan Discharge plan needs to be updated    Co-evaluation  AM-PAC PT "6 Clicks" Daily Activity  Outcome Measure  Difficulty turning over in bed (including adjusting bedclothes, sheets and blankets)?: A Little Difficulty moving from lying on back to sitting on the side of the bed? : Total Difficulty sitting down on and standing up from a chair with arms (e.g., wheelchair, bedside commode, etc,.)?: Total Help needed moving to and from a bed to chair (including a wheelchair)?: A Lot Help needed walking in hospital room?: A Lot Help needed climbing 3-5 steps with a railing? : A Lot 6 Click Score: 11    End of Session Equipment Utilized During Treatment: Gait belt;Oxygen Activity Tolerance: Patient limited by fatigue Patient left: in bed;with call bell/phone within reach;with bed alarm set;with family/visitor present   PT Visit  Diagnosis: History of falling (Z91.81);Unsteadiness on feet (R26.81)     Time: 2353-6144 PT Time Calculation (min) (ACUTE ONLY): 23 min  Charges:  $Therapeutic Activity: 23-37 mins                    G CodesLarae Grooms, PTA 02/06/2017, 3:29 PM

## 2017-02-06 NOTE — Care Management (Signed)
Barrier- patient still significantly orthostatic.  Wife is verbalizing concerns that patient has not been able to walk with therapy and there is a recommendation of going home with home health.  Physical therapy will reassess. Midodrine dose increased, coreg discontinued and started on florinef today in hopes of improving blood pressures.

## 2017-02-06 NOTE — Consult Note (Signed)
Cardiology Consult    Patient ID: Marco [name] MRN: [id number], DOB/AGE: [birth year]   Admit date: 02/01/2017 Date of Consult: 02/06/2017  Primary Physician: [name], MD Primary Cardiologist: Johnny Bridge, MD / S. [name], MD  Requesting Provider: Assunta Gambles  Patient Profile    Marco Krise Sr. is a 81 y.o. male with a history of CAD s/p CABG (2006), mixed ICM/NICM, chronic systolic CHF (EF prev as low as 25-30%  40-45% by echo 01/2017), MR s/p MVR (2006), Carotid dzs s/p R CEA, Afib/flutter s/p DCCV in 2014 and on chronic amio/eliquis, SSS s/p MDT PPM, AAA s/p repair (2008), CKD IV, HTN, HL, obesity, and COPD on supplemental O2, who is being seen today for the evaluation of orthostatic hypotension and presyncope at the request of Dr. Juleen China.  Past Medical History   Past Medical History:  Diagnosis Date  . AAA (abdominal aortic aneurysm)repaired    a. 12/2006 s/p repair of 6cm infrarenal AAA with 24mm Dacron graft.  . Anemia   . Anginal pain ([place])   . Atrial fibrillation/Flutter    a. Dx 2014;  b. 10/2012 s/p TEE/DCCV;  c. On amio and Eliquis (CHA2DS2VASc = 6).  . Basal cell carcinoma    a. 2013 x 2 back and left neck  . BPH (benign prostatic hypertrophy)   . Carotid artery occlusion    a. 11/2008 s/p R CEA; b. 01/2017 Carotid U/S: RICA <38%, LICA >93%.  . Chronic systolic heart failure ([place])    a. 02/2012 Echo: EF 30-35%, Pulm HTN, and modest RV dysfunction;  b. 10/2012 TEE: EF 25-30%, mild conc LVH, diff HK, mod RV dysfxn, mod dil RA/LA, mild MR, no MS, nl MVR, mod TR, Ao sclerosis; c. 01/2017 Echo: EF 45-50%, diff HK, triv MR, mod dil LA.  . CKD (chronic kidney disease), stage IV ([place])   . Coronary artery disease    a. 1999 PCI RCA;  b. 01/2005 NSTEMI: CABG x 3 (LIMA->LAD, VG->OM, VG->LPL) @ time of MVR.  [name] DVT (deep venous thrombosis) ([place])   . GERD (gastroesophageal reflux disease)   . History of hiatal hernia   . History of shingles   .  Hyperlipidemia   . Hypertensive heart disease   . Mitral regurgitation--s/p repair    a. 01/2005 MVR: 28-mm Edwards ET Logix ring annuloplasty; b. 10/2012 TEE: normal fxning MV ring with mild MR, no MS; 01/2017 Echo: Triv MR.  . Mixed Ischemic and Non-ischemic Cardiomyopathy    a. 02/2012 Echo: EF 30-35%, Pulm HTN, and modest RV dysfunction;  b. 10/2012 TEE: EF 25-30%, mild conc LVH, diff HK, mod RV dysfxn, mod dil RA/LA, mild MR, no MS, nl MVR, mod TR, Ao sclerosis; c. 01/2017 Echo: EF 45-50%.  . Myocardial infarction ([place])    2006  . Obesity   . Orthopnea   . Orthostatic hypotension    a. 11/2016 -> meds reduced;  b. 01/2017 admitted w/ presyncope and orthostasis->Meds held.  . Oxygen deficit    a. Wears 2 lpm most of the day and @ HS.  . Presence of permanent cardiac pacemaker   . PVC (premature ventricular contraction)    a. 02/2009 s/p RFCA ([place]).  . Sick sinus syndrome ([place])    a. 01/2005 s/p PPM 2/2 heart block following CABG;  b. 05/2012 Gen Change: MDT Adapta ADDRL1 DC PPM, ser # [id number].  . Sleep apnea    a. Intolerant of CPAP  . Type II diabetes mellitus (  [facility])     Past Surgical History:  Procedure Laterality Date  . ABDOMINAL AORTIC ANEURYSM REPAIR      May 2008,   . CARDIAC CATHETERIZATION  2006   @ [facility]  . CATARACT EXTRACTION     right  . CATARACT EXTRACTION W/PHACO Left 10/04/2016   Procedure: CATARACT EXTRACTION PHACO AND INTRAOCULAR LENS PLACEMENT (IOC);  Surgeon: Estill Cotta, MD;  Location: [facility] ORS;  Service: Ophthalmology;  Laterality: Left;  [place] 01:21 AP% 25.9 CDE 39.08 Fluid pack lot # [id number]   . CORONARY ARTERY BYPASS GRAFT     CABG with a  LIMA to the LAD, SVG to circumflex, SVG to posterolateral [id number].  Randolm Idol / REPLACE / REMOVE PACEMAKER  2006   Dr. [name]  . MITRAL VALVE REPAIR  2006    mitral valve repair with a #28 Edwards logic ring  . PACEMAKER PLACEMENT    . PERMANENT PACEMAKER GENERATOR CHANGE N/A 06/26/2012   Procedure: PERMANENT  PACEMAKER GENERATOR CHANGE;  Surgeon: Deboraha Sprang, MD;  Location: [facility] CATH LAB;  Service: Cardiovascular;  Laterality: N/A;  . SKIN BIOPSY     left wrist; right elbow  . SKIN CANCER EXCISION      x 6  . SKIN CANCER EXCISION    . TRANSESOPHAGEAL ECHOCARDIOGRAM    . VASECTOMY       Allergies  Allergies  Allergen Reactions  . Shellfish Allergy Anaphylaxis    History of Present Illness     81 y.o. ? with the above complex PMH including CAD s/p MI & CABG with MV repair in 2006, mixed ICM/NICM & chronic systolic CHF (EF prev as low as 25-30%  40-45% by echo 01/2017), Carotid dzs s/p R CEA, Afib/flutter s/p DCCV in 2014  chronic amio/eliquis, SSS s/p MDT PPM, AAA s/p repair (2008), CKD IV, HTN, HL, obesity, and COPD on supplemental O2.  He lives locally with his wife.  He is a retired Production manager.  He is very sedentary and sits most of the day.  He ambulates with a walker. He has not fallen.  His dry wt is ~ 250 lbs and notes that it has been stable.  He was seen in clinic by Johnny Bridge, MD in April and was noted to be orthostatic with BPs in the 80's.  He was dizzy with standing and his coreg was reduced to 3.125 mg daily, while lasix was reduced to every other day.  He remained on flomax and losartan.  He says that he did well over the better part of the subsequent 2 months.  He checked his BP regularly and he was not having any significant drops or presyncope.  On 6/7, he went to the bathroom to void.  While sitting on the toilet, he began to feel lightheaded.  He was not straining and was not holding his bladder for a prolonged time prior to going.  He was able to stand and use his walker to walk into his office.  He sat there and started feeling a little bit better so then he got up again and walked into the living room.  While sitting there, he again noted worsening of lightheadedness.  At times it felt like his vision was blackening but he never lost consciousness.  His wife called  EMS and he says that symptoms resolved by the time they got to the ED.  In the ED, blood pressures were normal to high.  ECG was A paced.  Troponins were nl.  He also reported having had hematuria.  CT abd/pelvis notable for renal stones/calcifications, but no acute findings.  Urine was hazy w/ mod leukocytes and many WBC/RBC.  He was treated with rocephin and admitted for further eval.  Since admission, in the setting of recurrent orthostatic hypotension and dizziness, all of his antihypertensives have been held and he has been placed on both midodrine and now florinef.  Echo showed improvement in LV fxn - 40-45%.  Carotid U/S showed stable L>R ICA dzs.  He has received IVF and is net +2.5L for this admission.  He denies any recent chest pain, change in chronic dyspnea, orthopnea, pnd, edema, or early satiety.  He doesn't think that he hydrates well @ home.  Inpatient Medications    . amiodarone      . amiodarone  200 mg Oral Daily  . apixaban      . apixaban  5 mg Oral BID  . calcitRIOL  0.5 mcg Oral Daily  . cholecalciferol      . cholecalciferol  1,000 Units Oral Daily  . docusate sodium      . docusate sodium  100 mg Oral BID  . finasteride      . finasteride  5 mg Oral Daily  . fludrocortisone  0.2 mg Oral Daily  . insulin aspart  0-20 Units Subcutaneous TID WC  . insulin aspart  0-5 Units Subcutaneous QHS  . ipratropium  2 spray Each Nare BID  . mouth rinse  15 mL Mouth Rinse BID  . midodrine  10 mg Oral TID WC  . mometasone  1 application Topical BID  . multivitamin with minerals  1 tablet Oral Daily  . multivitamin with minerals      . nystatin   Topical BID  . pantoprazole  40 mg Oral Q M,W,F  . rosuvastatin  10 mg Oral QHS  . sodium chloride flush  3 mL Intravenous Q12H  . traZODone  50 mg Oral QHS  . triamcinolone  2 spray Nasal Daily  . valACYclovir  1,000 mg Oral Q12H    Family History    Family History  Problem Relation Age of Onset  . Heart attack Mother 61  .  Diabetes Sister   . Hypertension Sister   . Heart attack Sister   . Heart attack Brother 37       3 Brothers deceased from [place] in 44's or 65's  . Heart disease Brother        Heart Disease before age 70  . Heart attack Father 82  . Diabetes Father        Amputation: bilateral feet  . Heart disease Father   . Heart attack Sister   . Heart attack Sister   . Heart attack Brother 43  . Heart attack Brother 19  . Diabetes Son     Social History    Social History   Social History  . Marital status: Married    Spouse name: N/A  . Number of children: N/A  . Years of education: N/A   Occupational History  . Retired     Engineer, structural in [place]  .  Retired   Social History Main Topics  . Smoking status: Former Smoker    Packs/day: 1.50    Years: 34.00    Types: Cigarettes    Quit date: 08/28/1987  . Smokeless tobacco: Never Used  . Alcohol use 0.6 oz/week    1 Cans of beer per week  Comment: Occasionally but none since his surgery in June  . Drug use: No  . Sexual activity: No   Other Topics Concern  . Not on file   Social History Narrative   Lives locally with wife.  Sedentary - sits most of the day.  Ambulates some around his home - uses a walker.     Review of Systems    General:  No chills, fever, night sweats or weight changes.  Cardiovascular:  No chest pain, +++ chronic dyspnea on exertion, no edema, orthopnea, palpitations, paroxysmal nocturnal dyspnea.  +++ presyncope and orthostasis. Dermatological: No rash, lesions/masses Respiratory: No cough, +++ chronic dyspnea Urologic: +++ hematuria on admission.  No dysuria Abdominal:   No nausea, vomiting, diarrhea, bright red blood per rectum, melena, or hematemesis Neurologic:  No visual changes, wkns, changes in mental status. All other systems reviewed and are otherwise negative except as noted above.  Physical Exam    Blood pressure (!) 103/55, pulse 78, temperature 98.4 F (36.9 C), temperature  source Oral, resp. rate 17, height 6' (1.829 m), weight 250 lb (113.4 kg), SpO2 95 %.  Orthostatic VS for the past 24 hrs:  BP- Lying Pulse- Lying BP- Sitting Pulse- Sitting BP- Standing at 0 minutes Pulse- Standing at 0 minutes  02/06/17 0749 115/56 67 144/68 71 155/81 80    General: Pleasant, NAD Psych: Normal affect. Neuro: Alert and oriented X 3. Moves all extremities spontaneously. HEENT: Normal  Neck: Supple without bruits.  JVP ~ 12 cm. Lungs:  Resp regular and unlabored, bibasilar crackles. Heart: RRR no s3, s4, or murmurs. Abdomen: Soft, obese, non-tender, non-distended, BS + x 4.  Extremities: No clubbing, cyanosis or edema. DP/PT/Radials 1+ and equal bilaterally.  Labs     Lab Results  Component Value Date   WBC 6.0 02/02/2017   HGB 11.3 (L) 02/02/2017   HCT 34.2 (L) 02/02/2017   MCV 91.1 02/02/2017   PLT 154 02/02/2017    Recent Labs Lab 02/01/17 2122  02/05/17 0302  NA 142  < > 149*  K 3.5  < > 3.8  CL 99*  < > 110  CO2 34*  < > 33*  BUN 57*  < > 43*  CREATININE 2.93*  < > 2.82*  CALCIUM 9.6  < > 9.2  PROT 7.0  --   --   BILITOT 0.6  --   --   ALKPHOS 55  --   --   ALT 18  --   --   AST 18  --   --   GLUCOSE 157*  < > 137*  < > = values in this interval not displayed. Lab Results  Component Value Date   CHOL 114 02/02/2017   HDL 37 (L) 02/02/2017   LDLCALC 66 02/02/2017   TRIG 56 02/02/2017    Radiology Studies    Ct Abdomen Pelvis Wo Contrast  Result Date: 02/02/2017 CLINICAL DATA:  Hematuria and chronic back pain EXAM: CT ABDOMEN AND PELVIS WITHOUT CONTRAST TECHNIQUE: Multidetector CT imaging of the abdomen and pelvis was performed following the standard protocol without IV contrast. COMPARISON:  10/30/2015, 01/12/2015, 03/12/2014 FINDINGS: Lower chest: Lung bases demonstrate scarring within both lung bases. Chronic consolidation in the left lower lobe. Stable chronic pleural collections with calcifications on the left, also unchanged. Partially  visualized cardiac pacing leads. Extensive coronary artery calcifications. Post CABG changes. Cardiomegaly. Hepatobiliary: No focal hepatic abnormality. Multiple calcified gallstones. No biliary dilatation. Pancreas: Unremarkable. No pancreatic ductal dilatation or surrounding  inflammatory changes. Spleen: Normal in size without focal abnormality. Adrenals/Urinary Tract: Adrenal glands are within normal limits. Multiple calcifications within both kidneys, some of which are intravascular. Largest focal calcification on the left is seen in the upper pole and measures 9 mm. Largest focal calcification on the right is seen in the lower pole and measures 8 mm. Kidneys are atrophic. No hydronephrosis or ureteral stone. The bladder is normal. Stomach/Bowel: Stomach is within normal limits. Appendix appears normal. No evidence of bowel wall thickening, distention, or inflammatory changes. Vascular/Lymphatic: Extensive atherosclerotic calcifications of the aorta. Mild aneurysmal dilatation of the distal aorta measuring up to 3.8 cm, not significantly changed. No significantly enlarged abdominal or pelvic lymph nodes. Reproductive: Prostate calcification. Other: No free air or free fluid.  Fat in the inguinal regions. Musculoskeletal: Post sternotomy changes. Stable chronic pars defect at L5 with 14 mm anterolisthesis of L5 on S1. Degenerative changes of the lumbar spine. IMPRESSION: 1. Multiple intrarenal calcifications likely reflect combination of intrarenal vascular calcification and multiple stones. Atrophic kidneys. No hydronephrosis or evidence for ureteral stone. 2. Stable chronic pleuroparenchymal disease in the bilateral lung bases 3. Cardiomegaly with post CABG changes and coronary calcifications 4. Gallstones 5. Extensive atherosclerotic vascular disease of the aorta with stable aneurysmal dilatation of the distal infrarenal abdominal aorta 6. Stable chronic pars defect at L5 with 14 mm anterolisthesis of L5 on S1  Electronically Signed   By: Donavan Foil M.D.   On: 02/02/2017 14:49   Dg Chest 1 View  Result Date: 02/01/2017 CLINICAL DATA:  Weakness and dizziness. EXAM: CHEST 1 VIEW COMPARISON:  October 28, 2015 FINDINGS: The left-sided effusion with underlying opacity is again identified and not significantly changed. The cardiomediastinal silhouette is stable. Haziness over the right base likely represent is effusion and opacity, also essentially stable in the interval. No acute interval changes. IMPRESSION: Bilateral pleural effusions and underlying atelectasis, not significantly changed since previous studies. Electronically Signed   By: Dorise Bullion III M.D   On: 02/01/2017 21:42   Ct Chest High Resolution  Result Date: 01/08/2017 CLINICAL DATA:  81 year old male former smoker (quit in 1988) with dyspnea and abnormal pulmonary function tests. EXAM: CT CHEST WITHOUT CONTRAST TECHNIQUE: Multidetector CT imaging of the chest was performed following the standard protocol without intravenous contrast. High resolution imaging of the lungs, as well as inspiratory and expiratory imaging, was performed. COMPARISON:  No priors. FINDINGS: Cardiovascular: Heart size is normal. There is no significant pericardial fluid, thickening or pericardial calcification. There is aortic atherosclerosis, as well as atherosclerosis of the great vessels of the mediastinum and the coronary arteries, including calcified atherosclerotic plaque in the left main, left anterior descending, left circumflex and right coronary arteries. Calcifications of the aortic valve. Status post mitral annuloplasty. Status post median sternotomy for CABG including LIMA to the LAD. Left-sided pacemaker device in place with lead tips terminating in the right atrial appendage and right ventricular apex. Mediastinum/Nodes: No pathologically enlarged mediastinal or hilar lymph nodes. Please note that accurate exclusion of hilar adenopathy is limited on noncontrast  CT scans. Esophagus is unremarkable in appearance. No axillary lymphadenopathy. Lungs/Pleura: Small right and small to moderate left pleural effusions are chronic and very similar to remote prior study 08/27/2011. There is pleural thickening bilaterally (left greater than right). Multifocal pleural calcifications are noted in the left hemithorax. No right-sided pleural calcifications are noted. There are peripheral subpleural bands in the right lung, very similar to remote prior study 08/27/2011. High-resolution images demonstrate no areas  of significant subpleural reticulation or frank honeycombing. Some very mild cylindrical bronchiectasis is noted in the right middle lobe and right lower lobe. Inspiratory and expiratory imaging is unremarkable. Mild diffuse bronchial wall thickening with mild centrilobular and paraseptal emphysema. Upper Abdomen: Aortic atherosclerosis. Musculoskeletal: Median sternotomy wires. There are no aggressive appearing lytic or blastic lesions noted in the visualized portions of the skeleton. IMPRESSION: 1. No evidence of interstitial lung disease. 2. There are chronic pleural effusions and areas of pleural thickening bilaterally, with some associated pleuroparenchymal scarring, very similar to the prior examination. This is presumably sequela of prior infection. 3. Chronic pleural calcifications in the left hemithorax, presumably sequela of prior left-sided pleural infection or pleural hemorrhage. 4. Mild cylindrical bronchiectasis in the right lower and right middle lobes, presumably sequela of remote infection. 5. Mild diffuse bronchial wall thickening with mild centrilobular and paraseptal emphysema; imaging findings suggestive of underlying COPD. 6. Aortic atherosclerosis, in addition to left main and 3 vessel coronary artery disease. Status post median sternotomy for CABG including LIMA to the LAD. 7. There are calcifications of the aortic valve. Echocardiographic correlation for  evaluation of potential valvular dysfunction may be warranted if clinically indicated. 8. Additional incidental findings, as above. Electronically Signed   By: Vinnie Langton M.D.   On: 01/08/2017 11:11   US Carotid Bilateral  Result Date: 02/02/2017 CLINICAL DATA:  Carotid stent EXAM: BILATERAL CAROTID DUPLEX ULTRASOUND TECHNIQUE: Pearline Cables scale imaging, color Doppler and duplex ultrasound were performed of bilateral carotid and vertebral arteries in the neck. COMPARISON:  None. FINDINGS: Criteria: Quantification of carotid stenosis is based on velocity parameters that correlate the residual internal carotid diameter with NASCET-based stenosis levels, using the diameter of the distal internal carotid lumen as the denominator for stenosis measurement. The following velocity measurements were obtained: RIGHT ICA:  172 cm/sec CCA:  89 cm/sec SYSTOLIC ICA/CCA RATIO:  1.9 DIASTOLIC ICA/CCA RATIO:  3.0 ECA:  83 cm/sec LEFT ICA:  248 cm/sec CCA:  416 cm/sec SYSTOLIC ICA/CCA RATIO:  2.3 DIASTOLIC ICA/CCA RATIO:  3.3 ECA:  195 cm/sec RIGHT CAROTID ARTERY: Mild scattered calcified plaque in the mid and upper common carotid artery. No significant plaque in the bulb. Low resistance internal carotid Doppler pattern. There is scattered plaque in the mid internal carotid artery. RIGHT VERTEBRAL ARTERY:  Antegrade. LEFT CAROTID ARTERY: There is significant irregular plaque in the bulb which shadows the lumen of the internal carotid artery. Low resistance internal carotid Doppler pattern is preserved. LEFT VERTEBRAL ARTERY:  Antegrade. IMPRESSION: Less than 50% stenosis in the right internal carotid artery. Greater than 70% stenosis in the left internal carotid artery. Electronically Signed   By: Marybelle Killings M.D.   On: 02/02/2017 10:53   Dg Chest Port 1 View  Result Date: 02/04/2017 CLINICAL DATA:  Hypotension EXAM: PORTABLE CHEST 1 VIEW COMPARISON:  Chest radiograph dated 02/01/2017. CT chest dated 01/08/2017. FINDINGS: Left  lower lobe opacity, likely a combination of atelectasis and pleural effusion when correlating with prior CT. Small layering right pleural effusion with associated right lower lobe scarring, also unchanged from prior CT. Increased interstitial markings without frank interstitial edema. Cardiomegaly. Prostatic valve. Postsurgical changes related to prior CABG. Left subclavian pacemaker. Median sternotomy. IMPRESSION: Chronic changes in the bilateral lower lobes, unchanged from prior CT. No evidence of acute cardiopulmonary disease. Electronically Signed   By: Julian Hy M.D.   On: 02/04/2017 10:42    ECG & Cardiac Imaging    ECG: A paced, V sensed, 86, LBBB.  _____________   2D Echocardiogram 6.8.2018  Conclusions   - Left ventricle: Poor image quality even with definity. The cavity   size was normal. Wall thickness was increased in a pattern of   moderate LVH. Systolic function was mildly reduced. The estimated   ejection fraction was in the range of 45% to 50%. Diffuse   hypokinesis. The study is not technically sufficient to allow   evaluation of LV diastolic function. - Mitral valve: Post repair with annuloplasty ring trivial residual   MR normal diastolic gradients. Valve area by pressure half-time:   1.79 cm^2. - Left atrium: The atrium was moderately dilated.   Assessment & Plan    1.  Orthostatic hypotension/Presyncope:  Pt with a h/o orthostasis resulting in reduction of outpt coreg and lasix doses in April, admitted 6/7 after prolonged period of presyncope.  Found to be orthostatic throughout admission.  All antihypertensives on hold.  Net + 2.5L for admission.  He was not orthostatic by BP this am (HR did rise some).  Echo showed improvement in LV fxn while Carotid U/S showed stable L>R ICA dzs.  He is now on both midodrine and florinef.  With history of CHF, we will need to be careful that he doesn't tip in the other direction.  He does have JVD on exam.  Only mild TR on  echo.  Will ask that he be weighed.  Would likely benefit from abdominal binder and we would favor this over adding florinef if possible/tolerated.  Will continue to hold previous doses of  blocker, arb, and lasix.  2.  CAD:  S/p CABG.  No recent c/p.   blocker, ARB, on hold in setting of above.  Cont statin. No ASA in the setting of chronic eliquis.  3.  Chronic systolic CHF/Mixed ICM & NICM:  EF 40-45% by echo this admission, which is an improvement from 2014 (prev 20-25%).  JVD and crackles noted on exam.  As above, we will need to find the balance between euvolemia and orthostasis.  Lasix remains on hold.  He has not been weighed in several days - will ask for a standing weight today.  If wt stable, may consider gentle hydration.   blocker and ARB on hold in setting of #1.  4.  Tachybrady syndrome: s/p PPM.  No recent palpitations.  Will ask MDT rep to interrogate PPM.  5.  CKD IV:  Creat stable.  Nephrology following.  6.  Hematuria/UTI:  S/p abx.  Cleared.  7.  Carotid Arterial Dzs:  >79 LICA, > 50 RICA stenoses.  Stable compared to 09/2016 study.  Outpt vascular surgery f/u.  8.  Afib/Flutter: A paced.  Remains on amio/eliquis.  Recent abnl pft w/ reduced DLCO.  High Res CT w/o pulmonary fibrosis.    9.  MR s/p MR Repair:  Triv MR by echo this admission.  10.  Hypertensive Heart Dzs:  See #1. Off of all antihypertensives currently.  11.  HL:  Cont statin.  LDL 66 on 02/02/17.  Signed, Murray Hodgkins, NP 02/06/2017, 11:10 AM

## 2017-02-07 ENCOUNTER — Telehealth: Payer: Self-pay | Admitting: *Deleted

## 2017-02-07 DIAGNOSIS — Z7401 Bed confinement status: Secondary | ICD-10-CM | POA: Diagnosis not present

## 2017-02-07 DIAGNOSIS — I4891 Unspecified atrial fibrillation: Secondary | ICD-10-CM | POA: Diagnosis not present

## 2017-02-07 DIAGNOSIS — N179 Acute kidney failure, unspecified: Secondary | ICD-10-CM | POA: Diagnosis not present

## 2017-02-07 DIAGNOSIS — N184 Chronic kidney disease, stage 4 (severe): Secondary | ICD-10-CM | POA: Diagnosis not present

## 2017-02-07 DIAGNOSIS — R319 Hematuria, unspecified: Secondary | ICD-10-CM | POA: Diagnosis not present

## 2017-02-07 DIAGNOSIS — R531 Weakness: Secondary | ICD-10-CM | POA: Diagnosis not present

## 2017-02-07 DIAGNOSIS — R310 Gross hematuria: Secondary | ICD-10-CM | POA: Diagnosis not present

## 2017-02-07 DIAGNOSIS — E1140 Type 2 diabetes mellitus with diabetic neuropathy, unspecified: Secondary | ICD-10-CM | POA: Diagnosis not present

## 2017-02-07 DIAGNOSIS — N189 Chronic kidney disease, unspecified: Secondary | ICD-10-CM | POA: Diagnosis not present

## 2017-02-07 DIAGNOSIS — I509 Heart failure, unspecified: Secondary | ICD-10-CM | POA: Diagnosis not present

## 2017-02-07 DIAGNOSIS — E1122 Type 2 diabetes mellitus with diabetic chronic kidney disease: Secondary | ICD-10-CM | POA: Diagnosis not present

## 2017-02-07 DIAGNOSIS — I10 Essential (primary) hypertension: Secondary | ICD-10-CM | POA: Diagnosis not present

## 2017-02-07 DIAGNOSIS — R6889 Other general symptoms and signs: Secondary | ICD-10-CM | POA: Diagnosis not present

## 2017-02-07 DIAGNOSIS — M6281 Muscle weakness (generalized): Secondary | ICD-10-CM | POA: Diagnosis not present

## 2017-02-07 DIAGNOSIS — R809 Proteinuria, unspecified: Secondary | ICD-10-CM | POA: Diagnosis not present

## 2017-02-07 DIAGNOSIS — R498 Other voice and resonance disorders: Secondary | ICD-10-CM | POA: Diagnosis not present

## 2017-02-07 DIAGNOSIS — N401 Enlarged prostate with lower urinary tract symptoms: Secondary | ICD-10-CM | POA: Diagnosis not present

## 2017-02-07 DIAGNOSIS — D631 Anemia in chronic kidney disease: Secondary | ICD-10-CM | POA: Diagnosis not present

## 2017-02-07 DIAGNOSIS — I129 Hypertensive chronic kidney disease with stage 1 through stage 4 chronic kidney disease, or unspecified chronic kidney disease: Secondary | ICD-10-CM | POA: Diagnosis not present

## 2017-02-07 DIAGNOSIS — R339 Retention of urine, unspecified: Secondary | ICD-10-CM | POA: Diagnosis not present

## 2017-02-07 DIAGNOSIS — G4709 Other insomnia: Secondary | ICD-10-CM | POA: Diagnosis not present

## 2017-02-07 DIAGNOSIS — I951 Orthostatic hypotension: Secondary | ICD-10-CM | POA: Diagnosis not present

## 2017-02-07 DIAGNOSIS — E784 Other hyperlipidemia: Secondary | ICD-10-CM | POA: Diagnosis not present

## 2017-02-07 DIAGNOSIS — I5031 Acute diastolic (congestive) heart failure: Secondary | ICD-10-CM | POA: Diagnosis not present

## 2017-02-07 DIAGNOSIS — K210 Gastro-esophageal reflux disease with esophagitis: Secondary | ICD-10-CM | POA: Diagnosis not present

## 2017-02-07 DIAGNOSIS — N2581 Secondary hyperparathyroidism of renal origin: Secondary | ICD-10-CM | POA: Diagnosis not present

## 2017-02-07 DIAGNOSIS — E119 Type 2 diabetes mellitus without complications: Secondary | ICD-10-CM | POA: Diagnosis not present

## 2017-02-07 DIAGNOSIS — Z794 Long term (current) use of insulin: Secondary | ICD-10-CM | POA: Diagnosis not present

## 2017-02-07 DIAGNOSIS — R55 Syncope and collapse: Secondary | ICD-10-CM | POA: Diagnosis not present

## 2017-02-07 DIAGNOSIS — N3001 Acute cystitis with hematuria: Secondary | ICD-10-CM | POA: Diagnosis not present

## 2017-02-07 DIAGNOSIS — R2681 Unsteadiness on feet: Secondary | ICD-10-CM | POA: Diagnosis not present

## 2017-02-07 DIAGNOSIS — R338 Other retention of urine: Secondary | ICD-10-CM | POA: Diagnosis not present

## 2017-02-07 DIAGNOSIS — J440 Chronic obstructive pulmonary disease with acute lower respiratory infection: Secondary | ICD-10-CM | POA: Diagnosis not present

## 2017-02-07 DIAGNOSIS — L97511 Non-pressure chronic ulcer of other part of right foot limited to breakdown of skin: Secondary | ICD-10-CM | POA: Diagnosis not present

## 2017-02-07 DIAGNOSIS — L97522 Non-pressure chronic ulcer of other part of left foot with fat layer exposed: Secondary | ICD-10-CM | POA: Diagnosis not present

## 2017-02-07 LAB — RENAL FUNCTION PANEL
ALBUMIN: 2.6 g/dL — AB (ref 3.5–5.0)
Anion gap: 4 — ABNORMAL LOW (ref 5–15)
BUN: 47 mg/dL — AB (ref 6–20)
CALCIUM: 9.2 mg/dL (ref 8.9–10.3)
CO2: 34 mmol/L — ABNORMAL HIGH (ref 22–32)
CREATININE: 2.67 mg/dL — AB (ref 0.61–1.24)
Chloride: 105 mmol/L (ref 101–111)
GFR, EST AFRICAN AMERICAN: 25 mL/min — AB (ref >60)
GFR, EST NON AFRICAN AMERICAN: 21 mL/min — AB (ref >60)
Glucose, Bld: 144 mg/dL — ABNORMAL HIGH (ref 65–99)
PHOSPHORUS: 4 mg/dL (ref 2.5–4.6)
Potassium: 4 mmol/L (ref 3.5–5.1)
SODIUM: 143 mmol/L (ref 135–145)

## 2017-02-07 LAB — CBC
HCT: 34 % — ABNORMAL LOW (ref 40.0–52.0)
Hemoglobin: 11.2 g/dL — ABNORMAL LOW (ref 13.0–18.0)
MCH: 30.7 pg (ref 26.0–34.0)
MCHC: 32.9 g/dL (ref 32.0–36.0)
MCV: 93.3 fL (ref 80.0–100.0)
PLATELETS: 163 10*3/uL (ref 150–440)
RBC: 3.64 MIL/uL — AB (ref 4.40–5.90)
RDW: 14.3 % (ref 11.5–14.5)
WBC: 7.1 10*3/uL (ref 3.8–10.6)

## 2017-02-07 LAB — GLUCOSE, CAPILLARY
Glucose-Capillary: 152 mg/dL — ABNORMAL HIGH (ref 65–99)
Glucose-Capillary: 133 mg/dL — ABNORMAL HIGH (ref 65–99)

## 2017-02-07 MED ORDER — FLUDROCORTISONE ACETATE 0.1 MG PO TABS: 0.1000 mg | ORAL_TABLET | Freq: Every day | ORAL | Status: DC

## 2017-02-07 MED ORDER — MIDODRINE HCL 10 MG PO TABS
10.0000 mg | ORAL_TABLET | Freq: Three times a day (TID) | ORAL | 0 refills | Status: DC
Start: 2017-02-07 — End: 2017-03-01

## 2017-02-07 MED ORDER — NYSTATIN 100000 UNIT/GM EX POWD: Freq: Two times a day (BID) | CUTANEOUS | 0 refills | Status: DC

## 2017-02-07 MED ORDER — TRAZODONE HCL 50 MG PO TABS: 50.0000 mg | ORAL_TABLET | Freq: Every evening | ORAL | 0 refills | Status: DC | PRN

## 2017-02-07 MED ORDER — INSULIN LISPRO PROT & LISPRO (75-25 MIX) 100 UNIT/ML ~~LOC~~ SUSP: 5.0000 [IU] | Freq: Two times a day (BID) | SUBCUTANEOUS | 0 refills | Status: AC

## 2017-02-07 MED ORDER — FLUDROCORTISONE ACETATE 0.1 MG PO TABS: 0.1000 mg | ORAL_TABLET | Freq: Every day | ORAL | 0 refills | Status: DC

## 2017-02-07 MED ORDER — FUROSEMIDE 40 MG PO TABS: ORAL_TABLET | ORAL | 0 refills | Status: DC

## 2017-02-07 MED ORDER — ALBUTEROL SULFATE (2.5 MG/3ML) 0.083% IN NEBU: 2.5000 mg | INHALATION_SOLUTION | Freq: Four times a day (QID) | RESPIRATORY_TRACT | 0 refills | Status: AC | PRN

## 2017-02-07 MED ORDER — GUAIFENESIN-DM 100-10 MG/5ML PO SYRP
5.0000 mL | ORAL_SOLUTION | ORAL | Status: DC | PRN
  Administered 2017-02-07: 5 mL via ORAL
  Filled 2017-02-07: qty 5

## 2017-02-07 MED ORDER — LOSARTAN POTASSIUM 25 MG PO TABS: ORAL_TABLET | ORAL | 0 refills | Status: DC

## 2017-02-07 NOTE — Telephone Encounter (Signed)
Currently Admitted

## 2017-02-07 NOTE — Telephone Encounter (Signed)
Patient contacted regarding discharge from [facility] on 02/07/17.  Left detailed voicemail message with appointment date, time, location, and instructions to call back if they have any questions regarding discharge instructions or medications.

## 2017-02-07 NOTE — Progress Notes (Signed)
[facility]  ROUNDING NOTE   Subjective:   Wife at bedside.   Objective:  Vital signs in last 24 hours:  Temp:  [98.2 F (36.8 C)-98.9 F (37.2 C)] 98.2 F (36.8 C) (06/13 1410) Pulse Rate:  [62-75] 75 (06/13 1410) Resp:  [17-20] 20 (06/13 1410) BP: (137-158)/(52-66) 137/61 (06/13 1410) SpO2:  [88 %-96 %] 96 % (06/13 1410) Weight:  [113.6 kg (250 lb 6.4 oz)] 113.6 kg (250 lb 6.4 oz) (06/13 0318)  Weight change:  Filed Weights   02/01/17 2124 02/06/17 1300 02/07/17 0318  Weight: 113.4 kg (250 lb) 112.4 kg (247 lb 14.4 oz) 113.6 kg (250 lb 6.4 oz)    Intake/Output: I/O last 3 completed shifts: In: 1 [P.O.:480; I.V.:3] Out: 1250 [Urine:1250]   Intake/Output this shift:  Total I/O In: 240 [P.O.:240] Out: -   Physical Exam: General: No acute distress  Head: Normocephalic, atraumatic. Moist oral mucosal membranes  Eyes: Anicteric  Neck: Supple, trachea midline  Lungs:  Clear to auscultation, normal effort  Heart: S1S2 no rubs  Abdomen:  Soft, nontender, bowel sounds present  Extremities: Trace peripheral edema.  Neurologic: Awake, alert, following commands  Skin: No lesions       Basic Metabolic Panel:  Recent Labs Lab 02/02/17 0201 02/03/17 0447 02/04/17 0421 02/05/17 0302 02/07/17 0343  NA 141 144 145 149* 143  K 3.7 3.7 4.0 3.8 4.0  CL 100* 104 108 110 105  CO2 34* 33* 33* 33* 34*  GLUCOSE 130* 159* 141* 137* 144*  BUN 54* 49* 43* 43* 47*  CREATININE 2.77* 2.99* 2.43* 2.82* 2.67*  CALCIUM 9.2 9.0 9.1 9.2 9.2  PHOS  --   --   --   --  4.0    Liver Function Tests:  Recent Labs Lab 02/01/17 2122 02/07/17 0343  AST 18  --   ALT 18  --   ALKPHOS 55  --   BILITOT 0.6  --   PROT 7.0  --   ALBUMIN 3.2* 2.6*   No results for input(s): LIPASE, AMYLASE in the last 168 hours. No results for input(s): AMMONIA in the last 168 hours.  CBC:  Recent Labs Lab 02/01/17 2122 02/02/17 0201 02/07/17 0343  WBC 7.1 6.0 7.1  NEUTROABS  4.7  --   --   HGB 12.3* 11.3* 11.2*  HCT 37.2* 34.2* 34.0*  MCV 91.8 91.1 93.3  PLT 152 154 163    Cardiac Enzymes:  Recent Labs Lab 02/01/17 2122 02/02/17 0201 02/02/17 0751 02/02/17 1517  TROPONINI <0.03 <0.03 <0.03 <0.03    BNP: Invalid input(s): POCBNP  CBG:  Recent Labs Lab 02/06/17 1221 02/06/17 1642 02/06/17 2112 02/07/17 0748 02/07/17 1142  GLUCAP 148* 164* 136* 152* 133*    Microbiology: Results for orders placed or performed during the hospital encounter of 02/01/17  Urine culture     Status: Abnormal   Collection Time: 02/01/17 10:11 PM  Result Value Ref Range Status   Specimen Description URINE, CLEAN CATCH  Final   Special Requests Normal  Final   Culture MULTIPLE SPECIES PRESENT, SUGGEST RECOLLECTION (A)  Final   Report Status 02/03/2017 FINAL  Final    Coagulation Studies: No results for input(s): LABPROT, INR in the last 72 hours.  Urinalysis: No results for input(s): COLORURINE, LABSPEC, PHURINE, GLUCOSEU, HGBUR, BILIRUBINUR, KETONESUR, PROTEINUR, UROBILINOGEN, NITRITE, LEUKOCYTESUR in the last 72 hours.  Invalid input(s): APPERANCEUR    Imaging: No results found.   Medications:    . amiodarone  200 mg Oral Daily  . apixaban  5 mg Oral BID  . calcitRIOL  0.5 mcg Oral Daily  . cholecalciferol  1,000 Units Oral Daily  . docusate sodium  100 mg Oral BID  . finasteride  5 mg Oral Daily  . [START ON 02/08/2017] fludrocortisone  0.1 mg Oral Daily  . insulin aspart  0-20 Units Subcutaneous TID WC  . insulin aspart  0-5 Units Subcutaneous QHS  . ipratropium  2 spray Each Nare BID  . mouth rinse  15 mL Mouth Rinse BID  . midodrine  10 mg Oral TID WC  . mometasone  1 application Topical BID  . multivitamin with minerals  1 tablet Oral Daily  . nystatin   Topical BID  . pantoprazole  40 mg Oral Q M,W,F  . rosuvastatin  10 mg Oral QHS  . sodium chloride flush  3 mL Intravenous Q12H  . traZODone  50 mg Oral QHS  . triamcinolone  2  spray Nasal Daily  . valACYclovir  1,000 mg Oral Q12H   acetaminophen **OR** acetaminophen, albuterol, bisacodyl, guaiFENesin-dextromethorphan, ipratropium, magnesium citrate, ondansetron **OR** ondansetron (ZOFRAN) IV, oxyCODONE, phenol, polyethylene glycol, senna-docusate, sodium chloride, sodium chloride flush  Assessment/ Plan:  81 y.o.white male with hypertension, diabetes mellitus type II, hyperlipidemia, chronic diastolic heart failure, coronary disease status post CABG in 2006, abdominal aortic aneurysm repair, and permanent pacemaker placement admitted for presynocpe.  Has also had intermittent gross hematuria.   1. Chronic kidney disease stage IV with proteinuria: baseline creatinine of 2.7, GFR of 22 on 11/01/16. Chronic kidney disease is secondary to diabetes mellitus type 2 and obstructive uropathy.  - Foley catheter placed. Nonoliguric urine output.  - labs stable. Creatinine at baseline.   2. Hypertension: with orthostatic symptoms. Holding home blood pressure medications - Increased midodrine.  - Holding tamsulosin.  - started on fludrocortisone - Appreciate cardiology input.   3. Secondary hyperparathyroidism: PTH 51 on 11/01/16. Calcium at goal.  - continue calcitriol  4. Anemia chronic kidney disease: hemoglobin 11.3.   5. Intermittent gross hematuria. Now with foley catheter.  - Follow up with urology  6. Diabetes mellitus type II with chronic kidney disease: concern for autonomic neuropathy - continue glucose control     LOS: Marco [name], Marco [name] 6/13/20182:22 PM

## 2017-02-07 NOTE — Consult Note (Signed)
   [facility] CM Inpatient Consult   02/07/2017  Marco Necaise Sr. 06-20-1936 [id number]   Patient screened for potential [place] Management services. Patient is on the [facility] registry as a benefit of their [employer] . Electronic medical record reveals patient's discharge plan is SNF.  [facility] Care Management services not appropriate at this time. If patient's post hospital needs change please place a [facility] Care Management consult. For questions please contact:   Aneita Kiger RN, [facility] Liaison  [phone]) Business Mobile [phone]) Toll free office

## 2017-02-07 NOTE — Clinical Social Work Note (Addendum)
Patient to be d/c'ed today to [facility] .  Patient and family agreeable to plans will transport via ems RN to call report to [facility] room [phone], packet next to patient's chart.  Evette Cristal, MSW, [place]

## 2017-02-07 NOTE — Care Management Important Message (Signed)
Important Message  Patient Details  Name: Marco Spahr Sr. MRN: [id number] Date of Birth: 19-Feb-1936   Medicare Important Message Given:  Yes    Katrina Stack, RN 02/07/2017, 10:25 AM

## 2017-02-07 NOTE — Progress Notes (Signed)
Patient Name: Marco [name] [facility] Sr. Date of Encounter: 02/07/2017  Primary Cardiologist: [facility] Problem List     Active Problems:   Near syncope   Pressure injury of skin     Subjective   Not longer orthostatic this morning. No dizziness with positional changes. Wants to go to rehab today.   Inpatient Medications    Scheduled Meds: . amiodarone  200 mg Oral Daily  . apixaban  5 mg Oral BID  . calcitRIOL  0.5 mcg Oral Daily  . cholecalciferol  1,000 Units Oral Daily  . docusate sodium  100 mg Oral BID  . finasteride  5 mg Oral Daily  . insulin aspart  0-20 Units Subcutaneous TID WC  . insulin aspart  0-5 Units Subcutaneous QHS  . ipratropium  2 spray Each Nare BID  . mouth rinse  15 mL Mouth Rinse BID  . midodrine  10 mg Oral TID WC  . mometasone  1 application Topical BID  . multivitamin with minerals  1 tablet Oral Daily  . nystatin   Topical BID  . pantoprazole  40 mg Oral Q M,W,F  . rosuvastatin  10 mg Oral QHS  . sodium chloride flush  3 mL Intravenous Q12H  . traZODone  50 mg Oral QHS  . triamcinolone  2 spray Nasal Daily  . valACYclovir  1,000 mg Oral Q12H   Continuous Infusions:  PRN Meds: acetaminophen **OR** acetaminophen, albuterol, bisacodyl, guaiFENesin-dextromethorphan, ipratropium, magnesium citrate, ondansetron **OR** ondansetron (ZOFRAN) IV, oxyCODONE, phenol, polyethylene glycol, senna-docusate, sodium chloride, sodium chloride flush   Vital Signs    Vitals:   02/06/17 1300 02/06/17 1414 02/06/17 2009 02/07/17 0318  BP:  (!) 119/54 (!) 158/66 (!) 141/52  Pulse:  75 75 62  Resp:  16 19 17   Temp:  98.5 F (36.9 C) 98.9 F (37.2 C) 98.2 F (36.8 C)  TempSrc:  Oral Oral Oral  SpO2:  93% 91% (!) 88%  Weight: 247 lb 14.4 oz (112.4 kg)   250 lb 6.4 oz (113.6 kg)  Height:        Intake/Output Summary (Last 24 hours) at 02/07/17 1023 Last data filed at 02/06/17 2306  Gross per 24 hour  Intake              240 ml  Output              1250 ml  Net            -1010 ml   Filed Weights   02/01/17 2124 02/06/17 1300 02/07/17 0318  Weight: 250 lb (113.4 kg) 247 lb 14.4 oz (112.4 kg) 250 lb 6.4 oz (113.6 kg)    Physical Exam    GEN: Well nourished, well developed, in no acute distress.  HEENT: Grossly normal.  Neck: Supple, no JVD, carotid bruits, or masses. Cardiac: RRR, no murmurs, rubs, or gallops. No clubbing, cyanosis, edema.  Radials/DP/PT 2+ and equal bilaterally.  Respiratory:  Decreased breath sounds bilaterally. GI: Soft, nontender, nondistended, BS + x 4. MS: no deformity or atrophy. Skin: warm and dry, no rash. Neuro:  Strength and sensation are intact. Psych: AAOx3.  Normal affect.  Labs    CBC  Recent Labs  02/07/17 0343  WBC 7.1  HGB 11.2*  HCT 34.0*  MCV 93.3  PLT 989   Basic Metabolic Panel  Recent Labs  02/05/17 0302 02/07/17 0343  NA 149* 143  K 3.8 4.0  CL 110 105  CO2 33*  34*  GLUCOSE 137* 144*  BUN 43* 47*  CREATININE 2.82* 2.67*  CALCIUM 9.2 9.2  PHOS  --  4.0   Liver Function Tests  Recent Labs  02/07/17 0343  ALBUMIN 2.6*   No results for input(s): LIPASE, AMYLASE in the last 72 hours. Cardiac Enzymes No results for input(s): CKTOTAL, CKMB, CKMBINDEX, TROPONINI in the last 72 hours. BNP Invalid input(s): POCBNP D-Dimer No results for input(s): DDIMER in the last 72 hours. Hemoglobin A1C No results for input(s): HGBA1C in the last 72 hours. Fasting Lipid Panel No results for input(s): CHOL, HDL, LDLCALC, TRIG, CHOLHDL, LDLDIRECT in the last 72 hours. Thyroid Function Tests No results for input(s): TSH, T4TOTAL, T3FREE, THYROIDAB in the last 72 hours.  Invalid input(s): FREET3  Telemetry    Paced, 60s bpm - Personally Reviewed  ECG    n/a - Personally Reviewed  Radiology    No results found.  Cardiac Studies   TTE 02/03/2017: Study Conclusions  - Left ventricle: Poor image quality even with definity. The cavity   size was normal.  Wall thickness was increased in a pattern of   moderate LVH. Systolic function was mildly reduced. The estimated   ejection fraction was in the range of 45% to 50%. Diffuse   hypokinesis. The study is not technically sufficient to allow   evaluation of LV diastolic function. - Mitral valve: Post repair with annuloplasty ring trivial residual   MR normal diastolic gradients. Valve area by pressure half-time:   1.79 cm^2. - Left atrium: The atrium was moderately dilated.  Patient Profile     81 y.o. male with history of CAD s/p CABG (2006), mixed ICM/NICM, chronic systolic CHF (EF prev as low as 25-30%  40-45% by echo 01/2017), MR s/p MVR (2006), Carotid dzs s/p R CEA, Afib/flutter s/p DCCV in 2014 and on chronic amio/eliquis, SSS s/p MDT PPM, AAA s/p repair (2008), CKD IV, HTN, HL, obesity, and COPD on supplemental O2, who was admitted for the evaluation of orthostatic hypotension and presyncope.  Assessment & Plan    1. Orthostatic hypotension: -Improved on midodrine 10 mg tid and Florinef 0.2 mg daily -No longer orthostatic -Given his CHF, will decrease Florinef to 0.1 mg daily -Continue midodrine 10 mg tid -Hold Coreg at this time, in the future could consider low dose Torpol XL in place of Coreg for less BP effect -PRN Lasix -Losartan 12.5 mg daily -Rehab -Consider abdominal binder  2. Chronic systolic CHF: -Improved EF as above -He does not appear grossly volume overloaded at this time -BB on hold as above -Losartan as above  3. CAD s/p CABG: -No symptoms concerning for angina -EF improved as above -No plans for inpatient ischemic evaluation -On Eliquis in place of ASA -Consider BB as above when able  4. PAF/Aflutter/tachybrady syndrome: -Status post PPM -Device appears to be functioning normally -BB on hold as above -Continue amiodarone  -Eliquis  5. Carotid artery disease: -RICA < 54% stenosis and LICA > 09% stenosis -Continue statin -Needs outpatient vascular  follow up  6. MR s/p MR repair: -Stable this admission by TTE  Signed, Christell Faith, PA-C [place] Pager: [phone] 02/07/2017, 10:23 AM   Attending Note Patient seen and examined, agree with detailed note above,  Patient presentation and plan discussed on rounds.   Patient known to me from the clinic Last 2 office visits with hypotension Medication recommendations had been made to cut back on some of his medications and doses. Unclear if there  is medication confusion or reluctance but he continue to take many of the medications  Presented this admission with hypotension, near syncope/orthostasis Medications have been held including Flomax, losartan, Coreg Tremendously deconditioned at baseline Blood pressure has responded to midodrine, fludrocortisone started by hospitalist service  Very lethargic on exam this morning, lungs clear to auscultation, no JVP, heart sounds regular with no murmurs appreciated, obese soft nontender abdomen, no significant lower extremity edema  Lab work reviewed showing creatinine 2.67, at his baseline He stable, hematocrit 34  ----Orthostatic hypotension Agree with holding medications as above Continue midodrine, consider holding Florinef  --- Chronic systolic CHF Ejection fraction 45-50% Unable to titrate medications at this time Consider holding fludrocortisone  ---CAD Continue statin Beta blockers on hold Not on aspirin as he is on eliquis  Greater than 50% was spent in counseling and coordination of care with patient Total encounter time 25 minutes or more   Signed: Esmond Plants  M.D., Ph.D. [facility]

## 2017-02-07 NOTE — Clinical Social Work Placement (Signed)
   CLINICAL SOCIAL WORK PLACEMENT  NOTE  Date:  02/07/2017  Patient Details  Name: Marco [name] [facility] Sr. MRN: [id number] Date of Birth: Apr 02, 1936  Clinical Social Work is seeking post-discharge placement for this patient at the [place] level of care (*CSW will initial, date and re-position this form in  chart as items are completed):  Yes   Patient/family provided with [place] Work Department's list of facilities offering this level of care within the geographic area requested by the patient (or if unable, by the patient's family).  Yes   Patient/family informed of their freedom to choose among providers that offer the needed level of care, that participate in Medicare, Medicaid or managed care program needed by the patient, have an available bed and are willing to accept the patient.  Yes   Patient/family informed of [place]'s ownership interest in [facility] and [facility], as well as of the fact that they are under no obligation to receive care at these facilities.  PASRR submitted to EDS on 02/06/17     PASRR number received on       Existing PASRR number confirmed on 02/06/17     FL2 transmitted to all facilities in geographic area requested by pt/family on 02/06/17     FL2 transmitted to all facilities within larger geographic area on       Patient informed that his/her managed care company has contracts with or will negotiate with certain facilities, including the following:        Yes   Patient/family informed of bed offers received.  Patient chooses bed at [facility]     Physician recommends and patient chooses bed at      Patient to be transferred to [facility] on 02/07/17.  Patient to be transferred to facility by [facility]     Patient family notified on 02/07/17 of transfer.  Name of family member notified:  Patient's wife [name]     PHYSICIAN Please sign FL2      Additional Comment:    _______________________________________________ [place] Ludwig, LCSWA 02/07/2017, 11:33 AM

## 2017-02-07 NOTE — Telephone Encounter (Signed)
-----   Message from Blain Pais sent at 02/07/2017 10:58 AM EDT ----- Regarding: tcm/ph DR. End  7/3 3:00

## 2017-02-07 NOTE — Progress Notes (Signed)
Report called to [place] at [facility].

## 2017-02-07 NOTE — Discharge Summary (Signed)
[facility] NAME: Marco [name]    MR#:  [id number]  DATE OF BIRTH:  1936/07/29  DATE OF ADMISSION:  02/01/2017 ADMITTING PHYSICIAN: Harvie Bridge, DO  DATE OF DISCHARGE: 02/07/2017  PRIMARY CARE PHYSICIAN: [name], MD    ADMISSION DIAGNOSIS:  Weakness [R53.1] Urinary tract infection with hematuria, site unspecified [N39.0, R31.9] Chronic kidney disease, unspecified CKD stage [N18.9]  DISCHARGE DIAGNOSIS:  Active Problems:   Near syncope   Pressure injury of skin   SECONDARY DIAGNOSIS:   Past Medical History:  Diagnosis Date  . AAA (abdominal aortic aneurysm)repaired    a. 12/2006 s/p repair of 6cm infrarenal AAA with 31mm Dacron graft.  . Anemia   . Anginal pain ([place])   . Atrial fibrillation/Flutter    a. Dx 2014;  b. 10/2012 s/p TEE/DCCV;  c. On amio and Eliquis (CHA2DS2VASc = 6).  . Basal cell carcinoma    a. 2013 x 2 back and left neck  . BPH (benign prostatic hypertrophy)   . Carotid artery occlusion    a. 11/2008 s/p R CEA; b. 01/2017 Carotid U/S: RICA <09%, LICA >38%.  . Chronic systolic heart failure ([place])    a. 02/2012 Echo: EF 30-35%, Pulm HTN, and modest RV dysfunction;  b. 10/2012 TEE: EF 25-30%, mild conc LVH, diff HK, mod RV dysfxn, mod dil RA/LA, mild MR, no MS, nl MVR, mod TR, Ao sclerosis; c. 01/2017 Echo: EF 45-50%, diff HK, triv MR, mod dil LA.  . CKD (chronic kidney disease), stage IV ([place])   . Coronary artery disease    a. 1999 PCI RCA;  b. 01/2005 NSTEMI: CABG x 3 (LIMA->LAD, VG->OM, VG->LPL) @ time of MVR.  [name] DVT (deep venous thrombosis) ([place])   . GERD (gastroesophageal reflux disease)   . History of hiatal hernia   . History of shingles   . Hyperlipidemia   . Hypertensive heart disease   . Mitral regurgitation--s/p repair    a. 01/2005 MVR: 28-mm Edwards ET Logix ring annuloplasty; b. 10/2012 TEE: normal fxning MV ring with mild MR, no MS; 01/2017 Echo: Triv MR.  . Mixed Ischemic and  Non-ischemic Cardiomyopathy    a. 02/2012 Echo: EF 30-35%, Pulm HTN, and modest RV dysfunction;  b. 10/2012 TEE: EF 25-30%, mild conc LVH, diff HK, mod RV dysfxn, mod dil RA/LA, mild MR, no MS, nl MVR, mod TR, Ao sclerosis; c. 01/2017 Echo: EF 45-50%.  . Myocardial infarction ([place])    2006  . Obesity   . Orthopnea   . Orthostatic hypotension    a. 11/2016 -> meds reduced;  b. 01/2017 admitted w/ presyncope and orthostasis->Meds held.  . Oxygen deficit    a. Wears 2 lpm most of the day and @ HS.  . Presence of permanent cardiac pacemaker   . PVC (premature ventricular contraction)    a. 02/2009 s/p RFCA ([place]).  . Sick sinus syndrome ([place])    a. 01/2005 s/p PPM 2/2 heart block following CABG;  b. 05/2012 Gen Change: MDT Adapta ADDRL1 DC PPM, ser # [id number].  . Sleep apnea    a. Intolerant of CPAP  . Type II diabetes mellitus ([place])     HOSPITAL COURSE:   1. Severe orthostatic hypotension. Near syncope and generalized weakness. Initially we had to hold Flomax Cozaar and Coreg. Patient was still orthostatic after this. I added midodrine 5 mg 3 times a day and then had to titrate up to 10  mg 3 times a day. Florinef was added. On the day of discharge the patient was not orthostatic. Nephrology wanted and back a half dose Cozaar. Cardiology wanted Lasix when necessary for weight gain. Dr. Candis Musa wants to be contacted for any changes for orthostasis or hypotension or weight gain. Phone number is [phone]. Hopefully the goal will be to come off the Florinef. Deconditioning likely playing a large role. TED hose ordered. Abdominal binder can be tried. 2. Acute urinary retention. Foley catheter placed 3 days ago and 1400 mL came out. Unfortunately need to hold Flomax at this time with orthostatic hypotension. Continue Proscar for BPH. Follow-up with Dr. Louis Meckel urology as outpatient for voiding trial. Unfortunately cannot do alpha blockers at this time with severe orthostatic hypotension. If the Foley  catheter remains in then it will have to be changed every 3-4 weeks. 3. Acute on chronic kidney disease stage III. Creatinine has fluctuated during the hospital course and currently at 2.67 at the time of discharge. Patient was seen by nephrology. They would like to restart half dose of losartan. 4. Acute cystitis with hematuria. Patient received a course of antibiotics while here in the hospital 5. Atrial fibrillation history on Eliquis and amiodarone. Coreg needed to be held with orthostatic hypotension. 6. BPH on Proscar. Flomax held. Foley catheter placed for urinary retention. 7. Hyperlipidemia on Crestor 8. GERD on omeprazole 9. Weakness. Patient will be transferred to rehabilitation today. 10. History of heart failure with mid range EF of 45-50%. No signs of congestive heart failure on this hospital stay. Unable to be on beta blocker secondary to orthostatic hypotension. Try to restart low-dose Cozaar. Spironolactone contraindicated with chronic kidney disease. Lasix as needed for weight gain of 3 pounds in 1 day or 5 pounds in a week. 11. Chronic respiratory failure on 2 L of oxygen chronically   DISCHARGE CONDITIONS:   Satisfactory  CONSULTS OBTAINED:  Treatment Team:  Anthonette Legato, MD End, Harrell Gave, MD  DRUG ALLERGIES:   Allergies  Allergen Reactions  . Shellfish Allergy Anaphylaxis    DISCHARGE MEDICATIONS:   Current Discharge Medication List    START taking these medications   Details  albuterol (PROVENTIL) (2.5 MG/3ML) 0.083% nebulizer solution Take 3 mLs (2.5 mg total) by nebulization every 6 (six) hours as needed for wheezing or shortness of breath. Qty: 75 mL, Refills: 0    fludrocortisone (FLORINEF) 0.1 MG tablet Take 1 tablet (0.1 mg total) by mouth daily. Qty: 30 tablet, Refills: 0    midodrine (PROAMATINE) 10 MG tablet Take 1 tablet (10 mg total) by mouth 3 (three) times daily with meals. Qty: 90 tablet, Refills: 0    nystatin (MYCOSTATIN/NYSTOP)  powder Apply topically 2 (two) times daily. Qty: 15 g, Refills: 0    traZODone (DESYREL) 50 MG tablet Take 1 tablet (50 mg total) by mouth at bedtime as needed for sleep. Qty: 30 tablet, Refills: 0      CONTINUE these medications which have CHANGED   Details  furosemide (LASIX) 40 MG tablet One tablet daily for weight gain of 3 pounds in one day or five pounds in one week Qty: 30 tablet, Refills: 0    insulin lispro protamine-lispro (HUMALOG MIX 75/25) (75-25) 100 UNIT/ML SUSP injection Inject 5 Units into the skin 2 (two) times daily with a meal. Qty: 10 mL, Refills: 0    losartan (COZAAR) 25 MG tablet 0.5 tablets daily orally Qty: 15 tablet, Refills: 0      CONTINUE these medications which have  NOT CHANGED   Details  ACCU-CHEK FASTCLIX LANCETS MISC TEST twice a day if needed Qty: 102 each, Refills: 12    !! ACCU-CHEK SMARTVIEW test strip Refills: 1    !! ACCU-CHEK SMARTVIEW test strip TEST twice a day Qty: 300 each, Refills: 12    amiodarone (PACERONE) 200 MG tablet take 1 tablet by mouth once daily Qty: 30 tablet, Refills: 3    !! BD INSULIN SYRINGE ULTRAFINE 31G X 5/16" 0.3 ML MISC Refills: 1    !! BD INSULIN SYRINGE ULTRAFINE 31G X 5/16" 0.3 ML MISC use twice a day as directed Qty: 100 each, Refills: 12    calcitRIOL (ROCALTROL) 0.5 MCG capsule take 1 capsule by mouth once daily Qty: 90 capsule, Refills: 3    cholecalciferol (VITAMIN D) 1000 UNITS tablet Take 1,000 Units by mouth daily.    docusate sodium (COLACE) 100 MG capsule Take 100 mg by mouth 2 (two) times daily.      ELIQUIS 5 MG TABS tablet take 1 tablet by mouth twice a day Qty: 60 tablet, Refills: 11    finasteride (PROSCAR) 5 MG tablet Take 1 tablet (5 mg total) by mouth daily. Qty: 30 tablet, Refills: 5    mometasone (ELOCON) 0.1 % ointment Apply 1 application topically 2 (two) times daily. Refills: 0    Multiple Vitamins-Minerals (MULTI-VITAMIN GUMMIES) CHEW Chew by mouth daily.    omeprazole  (PRILOSEC) 20 MG capsule Take 20 mg by mouth daily. Monday, Wednesday, friday    OXYGEN Inhale 1.5 L into the lungs.    polyethylene glycol (MIRALAX / GLYCOLAX) packet Take 17 g by mouth daily as needed for mild constipation. Qty: 14 each, Refills: 0    rosuvastatin (CRESTOR) 20 MG tablet take 1 tablet by mouth at bedtime Qty: 90 tablet, Refills: 3    triamcinolone (NASACORT ALLERGY 24HR) 55 MCG/ACT AERO nasal inhaler Place 2 sprays into the nose daily.     !! - Potential duplicate medications found. Please discuss with provider.    STOP taking these medications     carvedilol (COREG) 3.125 MG tablet      tamsulosin (FLOMAX) 0.4 MG CAPS capsule      valACYclovir (VALTREX) 1000 MG tablet          DISCHARGE INSTRUCTIONS:   Satisfactory  If you experience worsening of your admission symptoms, develop shortness of breath, life threatening emergency, suicidal or homicidal thoughts you must seek medical attention immediately by calling 911 or calling your MD immediately  if symptoms less severe.  You Must read complete instructions/literature along with all the possible adverse reactions/side effects for all the Medicines you take and that have been prescribed to you. Take any new Medicines after you have completely understood and accept all the possible adverse reactions/side effects.   Please note  You were cared for by a hospitalist during your hospital stay. If you have any questions about your discharge medications or the care you received while you were in the hospital after you are discharged, you can call the unit and asked to speak with the hospitalist on call if the hospitalist that took care of you is not available. Once you are discharged, your primary care physician will handle any further medical issues. Please note that NO REFILLS for any discharge medications will be authorized once you are discharged, as it is imperative that you return to your primary care physician  (or establish a relationship with a primary care physician if you do not have one)  for your aftercare needs so that they can reassess your need for medications and monitor your lab values.    Today   CHIEF COMPLAINT:   Chief Complaint  Patient presents with  . Fatigue    HISTORY OF PRESENT ILLNESS:  Marco [name]  is a 81 y.o. male presented with fatigue   VITAL SIGNS:  Blood pressure (!) 141/52, pulse 62, temperature 98.2 F (36.8 C), temperature source Oral, resp. rate 17, height 6' (1.829 m), weight 113.6 kg (250 lb 6.4 oz), SpO2 (!) 88 %.    PHYSICAL EXAMINATION:  GENERAL:  81 y.o.-year-old patient lying in the bed with no acute distress.  EYES: Pupils equal, round, reactive to light and accommodation. No scleral icterus. Extraocular muscles intact.  HEENT: Head atraumatic, normocephalic. Oropharynx and nasopharynx clear.  NECK:  Supple, no jugular venous distention. No thyroid enlargement, no tenderness.  LUNGS: Decreased breath sounds bilaterally, no wheezing, rales,rhonchi or crepitation. No use of accessory muscles of respiration.  CARDIOVASCULAR: S1, S2 normal. No murmurs, rubs, or gallops.  ABDOMEN: Soft, non-tender, non-distended. Bowel sounds present. No organomegaly or mass.  EXTREMITIES: 2+ edema, no cyanosis, or clubbing.  NEUROLOGIC: Cranial nerves II through XII are intact. Muscle strength 5/5 in all extremities. Sensation intact. Gait not checked.  PSYCHIATRIC: The patient is alert and oriented x 3.  SKIN: No ulcers seen  DATA REVIEW:   CBC  Recent Labs Lab 02/07/17 0343  WBC 7.1  HGB 11.2*  HCT 34.0*  PLT 163    Chemistries   Recent Labs Lab 02/01/17 2122  02/07/17 0343  NA 142  < > 143  K 3.5  < > 4.0  CL 99*  < > 105  CO2 34*  < > 34*  GLUCOSE 157*  < > 144*  BUN 57*  < > 47*  CREATININE 2.93*  < > 2.67*  CALCIUM 9.6  < > 9.2  AST 18  --   --   ALT 18  --   --   ALKPHOS 55  --   --   BILITOT 0.6  --   --   < > = values in this  interval not displayed.  Cardiac Enzymes  Recent Labs Lab 02/02/17 1517  TROPONINI <0.03    Microbiology Results  Results for orders placed or performed during the hospital encounter of 02/01/17  Urine culture     Status: Abnormal   Collection Time: 02/01/17 10:11 PM  Result Value Ref Range Status   Specimen Description URINE, CLEAN CATCH  Final   Special Requests Normal  Final   Culture MULTIPLE SPECIES PRESENT, SUGGEST RECOLLECTION (A)  Final   Report Status 02/03/2017 FINAL  Final       Management plans discussed with the patient, family and they are in agreement.  CODE STATUS:     Code Status Orders        Start     Ordered   02/02/17 0153  Full code  Continuous     02/02/17 0152    Code Status History    Date Active Date Inactive Code Status Order ID Comments User Context   06/10/2016  9:09 PM 06/12/2016  3:20 PM Full Code [id number]  Idelle Crouch, MD Inpatient   10/28/2015 11:14 AM 10/29/2015  3:01 PM Full Code [id number]  Hillary Bow, MD ED    Advance Directive Documentation     Most Recent Value  Type of Advance Directive  Living will, Healthcare Power of Attorney  Pre-existing  out of facility DNR order (yellow form or pink MOST form)  -  "MOST" Form in Place?  -      TOTAL TIME TAKING CARE OF THIS PATIENT: 35 minutes.    Loletha Grayer M.D on 02/07/2017 at 10:43 AM  Between 7am to 6pm - Pager - [phone]  After 6pm go to [email or web address] - password [email or web address] [facility]  [phone]  CC: Primary care physician; [name], MD

## 2017-02-07 NOTE — Clinical Social Work Note (Signed)
Clinical Social Work Assessment  Patient Details  Name: Marco Deviney Sr. MRN: [id number] Date of Birth: 1936/08/10  Date of referral:  02/06/17               Reason for consult:  Facility Placement                Permission sought to share information with:  Facility Sport and exercise psychologist, Family Supports Permission granted to share information::  Yes, Verbal Permission Granted  Name::     Seger, Jani [phone]  [phone]   Agency::  SNF admissions  Relationship::     Contact Information:     Housing/Transportation Living arrangements for the past 2 months:  [name] of Information:  Patient, Spouse Patient Interpreter Needed:  None Criminal Activity/Legal Involvement Pertinent to Current Situation/Hospitalization:  No - Comment as needed Significant Relationships:  Spouse Lives with:  Spouse Do you feel safe going back to the place where you live?  No Need for family participation in patient care:  No (Coment)  Care giving concerns:  Patient and family feel like he needs short term rehab before he is able to return back home.   Social Worker assessment / plan:  Patient is a 81 year old male who is alert and oriented x4.  Patient expressed that he likes to swim and watch TV.  Patient lives with his wife in a house, and stated that he has not been to SNF rehab before.  Patient was explained role of CSW and process for looking for SNF bed placement.  CSW informed patient and his wife how insurance will pay for stay.  Patient and wife are agreeable to have CSW begin bed search in [place].  Patient and family did not express any other questions or concerns about going to SNF.  Employment status:  Retired Nurse, adult PT Recommendations:  [place] / Referral to community resources:  [place]  Patient/Family's Response to care:  Patient and family are agreeable to  going to SNF for short term rehab.  Patient/Family's Understanding of and Emotional Response to Diagnosis, Current Treatment, and Prognosis:  Patient states he would like to go home but is aware that he needs to go to SNF for short term rehab before he is able to return home.  Patient is hopeful that he will not have to be at [facility] for very long.  Emotional Assessment Appearance:  Appears stated age Attitude/Demeanor/Rapport:    Affect (typically observed):  Appropriate, Calm, Pleasant Orientation:  Oriented to Place, Oriented to Self, Oriented to  Time, Oriented to Situation Alcohol / Substance use:  Not Applicable Psych involvement (Current and /or in the community):  No (Comment)  Discharge Needs  Concerns to be addressed:  Lack of Support Readmission within the last 30 days:  No Current discharge risk:  Lack of support system Barriers to Discharge:  No Barriers Identified   Ross Ludwig, LCSWA 02/07/2017, 11:01 AM

## 2017-02-10 DIAGNOSIS — M6281 Muscle weakness (generalized): Secondary | ICD-10-CM | POA: Diagnosis not present

## 2017-02-10 DIAGNOSIS — E119 Type 2 diabetes mellitus without complications: Secondary | ICD-10-CM | POA: Diagnosis not present

## 2017-02-10 DIAGNOSIS — I10 Essential (primary) hypertension: Secondary | ICD-10-CM | POA: Diagnosis not present

## 2017-02-10 DIAGNOSIS — I509 Heart failure, unspecified: Secondary | ICD-10-CM | POA: Diagnosis not present

## 2017-02-10 DIAGNOSIS — I4891 Unspecified atrial fibrillation: Secondary | ICD-10-CM | POA: Diagnosis not present

## 2017-02-12 ENCOUNTER — Other Ambulatory Visit
Admission: RE | Admit: 2017-02-12 | Discharge: 2017-02-12 | Disposition: A | Discharge status: Home / Self Care | Payer: Medicare Other | Source: Ambulatory Visit | Attending: Family Medicine | Admitting: Family Medicine

## 2017-02-12 DIAGNOSIS — I509 Heart failure, unspecified: Secondary | ICD-10-CM | POA: Insufficient documentation

## 2017-02-12 DIAGNOSIS — R531 Weakness: Secondary | ICD-10-CM | POA: Insufficient documentation

## 2017-02-12 DIAGNOSIS — E119 Type 2 diabetes mellitus without complications: Secondary | ICD-10-CM | POA: Insufficient documentation

## 2017-02-12 LAB — BRAIN NATRIURETIC PEPTIDE: B Natriuretic Peptide: 200 pg/mL — ABNORMAL HIGH (ref 0.0–100.0)

## 2017-02-13 ENCOUNTER — Telehealth: Payer: Self-pay | Admitting: Family Medicine

## 2017-02-14 ENCOUNTER — Other Ambulatory Visit: Payer: Self-pay | Admitting: *Deleted

## 2017-02-14 ENCOUNTER — Ambulatory Visit (INDEPENDENT_AMBULATORY_CARE_PROVIDER_SITE_OTHER): Payer: Medicare Other

## 2017-02-14 VITALS — BP 134/77 | HR 76

## 2017-02-14 DIAGNOSIS — R310 Gross hematuria: Secondary | ICD-10-CM

## 2017-02-14 NOTE — Patient Outreach (Signed)
[facility]) Care Management  02/14/2017  Marco Mcgaugh Sutton Sr. October 25, 1935 [id number]   Transition of Care Referral  Referral Date:02/14/17 Referral Source:[employer] High Risk Date of Discharge:02/07/17 Facility: [facility] Discharge Diagnosis: Weakness, UTI, CKD Insurance: [facility] Medicare  Outreach attempt #1 to patient. No answer. RN CM left HIPAA compliant message along with contact info.    Plan: RN CM will contact patient within the next business day.   Lake Bells, RN, BSN, MHA/MSL, [place] Telephonic Care Manager Coordinator [facility] Direct Phone: [phone] Toll Free: [phone] Fax: [phone]

## 2017-02-15 ENCOUNTER — Other Ambulatory Visit: Payer: Self-pay | Admitting: *Deleted

## 2017-02-15 ENCOUNTER — Ambulatory Visit: Payer: Self-pay | Admitting: *Deleted

## 2017-02-15 DIAGNOSIS — N2581 Secondary hyperparathyroidism of renal origin: Secondary | ICD-10-CM | POA: Diagnosis not present

## 2017-02-15 DIAGNOSIS — E1122 Type 2 diabetes mellitus with diabetic chronic kidney disease: Secondary | ICD-10-CM | POA: Diagnosis not present

## 2017-02-15 DIAGNOSIS — D631 Anemia in chronic kidney disease: Secondary | ICD-10-CM | POA: Diagnosis not present

## 2017-02-15 DIAGNOSIS — I5020 Unspecified systolic (congestive) heart failure: Secondary | ICD-10-CM

## 2017-02-15 DIAGNOSIS — N184 Chronic kidney disease, stage 4 (severe): Secondary | ICD-10-CM | POA: Diagnosis not present

## 2017-02-15 NOTE — Progress Notes (Signed)
Fill and Pull Catheter Removal  Patient is present today for a catheter removal.  Patient   stated he would not be able to urinate in the portable urinal after catheter removal for voiding trial. He said the only way he would be able to urinate would be sitting on a toilet. Removed catheter,  advised patient to return to facility, drink plenty of fluids, gave portable urinal to record amount, per Dr. Erlene Quan pt to return at 3pm.  Patient returned to office. Voided 63ml in urinal, wasted 20 ml in attempt to get to toilet per pt and spouse. Pt said he drank over 1 qt of fluids. Attempted bladder scan, but reading of 64ml thought to be inaccurate b/c of patient's position in wheelchair. Placed pt in lift. Transferred to reclining chair/bed. Placed catheter. Obtained 100 ml of urine. Removed catheter per Dr. Erlene Quan. Explained importance of cleaning penis and groin area, drinking water, and to contact office if having signs/sx(s) of urinary retention. Pt verbalized understanding.   Performed by: C. Corinna Capra, CMA and S. Pascal Lux, CMA

## 2017-02-15 NOTE — Patient Outreach (Signed)
[facility]) Care Management  02/15/2017  Marco Corvin Rawlings Sr. [birth year] [id number]   Met with patient at facility. Patient reports he just got in from a kidney specialist appointment.  Patient reports he has history of HF that "there is not much anybody can do for it". Also, has history of recent hypotension, UTI, AFIB, DM. Patient is on 2lpm O2.  Patient reports he is married to his wife Marco [name]. She assists patient with medication management and transportation. He reports he cannot see very well and no longer has a driver's license.  Patient reports he checks his weight and vital signs on M,W,F. 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   RNCM reviewed [facility] program. Patient would appreciate calls regarding his health, he is not sure if he needs or wants home visits as he is anticipating home care services.   Consent obtained at bedside. THN packet left at bedside.  Plan to refer to [place] for transition of care calls and to evaluation further for home visit needs.  Royetta Crochet. Laymond Purser, RN, BSN, [place] [phone]) Business Cell  [phone]) Toll Free Office

## 2017-02-15 NOTE — Addendum Note (Signed)
Addended by: Gabriel Cirri on: 02/15/2017 12:12 PM   Modules accepted: Orders

## 2017-02-17 DIAGNOSIS — R339 Retention of urine, unspecified: Secondary | ICD-10-CM | POA: Diagnosis not present

## 2017-02-17 DIAGNOSIS — E119 Type 2 diabetes mellitus without complications: Secondary | ICD-10-CM | POA: Diagnosis not present

## 2017-02-17 DIAGNOSIS — I4891 Unspecified atrial fibrillation: Secondary | ICD-10-CM | POA: Diagnosis not present

## 2017-02-17 DIAGNOSIS — M6281 Muscle weakness (generalized): Secondary | ICD-10-CM | POA: Diagnosis not present

## 2017-02-17 DIAGNOSIS — N401 Enlarged prostate with lower urinary tract symptoms: Secondary | ICD-10-CM | POA: Diagnosis not present

## 2017-02-19 ENCOUNTER — Ambulatory Visit: Payer: Medicare Other | Admitting: Family Medicine

## 2017-02-20 ENCOUNTER — Telehealth: Payer: Self-pay | Admitting: Interventional Cardiology

## 2017-02-20 ENCOUNTER — Telehealth: Payer: Self-pay

## 2017-02-20 ENCOUNTER — Telehealth: Payer: Self-pay | Admitting: Cardiovascular Disease

## 2017-02-20 NOTE — Telephone Encounter (Signed)
Called Shawn but he was gone for the day. Held for a while before someone picked up. Transfer to his voicemail ended up hanging up. Will call back tomorrow-aa

## 2017-02-20 NOTE — Telephone Encounter (Signed)
Patient is at [facility] for inpatient rehab syncope, weakness, orthostasis.  Patient would like out patient water therapy at [facility].  [facility] can not write this order it has to be from his PCP.  Are you willing to write for this.  Water therapy for strength and balance.  Twice weekly for 3 week.  Call back [place] [phone]. Fax order to [facility] rehab department

## 2017-02-20 NOTE — Telephone Encounter (Signed)
Spoke with nurse at [facility] and she reports patients weight gain over night. Reviewed weights that we have listed for him which is in the 256 lb range. She reports that his range there with her runs from 242-244. She reports that they already have an order for increasing furosemide for weight gain and that they did administer that. Instructed her to make sure he restricts fluids to 2 liters/day, wear TED hose, decrease salt intake, and to keep legs propped up when sitting. Let her know to also please continue monitoring his daily weights and to call back if they persistently stay higher than his normal ranges. Made her aware that I will make Dr. Rockey Situ aware and to call back if weight gain persists or worsens. She verbalized understanding and had no further questions at this time.

## 2017-02-20 NOTE — Telephone Encounter (Signed)
[place] Nurse at [facility] calling to let us know pt had an overnight weight change   242.6 and today 251.4 No swelling no SOB  Just would like to know what do  They will be giving him an extra 40 mg lasix   Please advise

## 2017-02-20 NOTE — Telephone Encounter (Signed)
I was called from the facility patient is staying now that he's been gaining weight for last few days and not responding to PO Lasix. He's being taking now 40 mg PO daily. His weight according to nursing staff is 252lbs from 241lbs on admission. It seems that our last documented weight was 250lbs on 6/13, weights might be different due to different scales. I was told that he's been gaining weight over last few days on their scale. They will do Lasix 80mg  IV bid for 3 days and monitor the response. Fluid restriction and lo salt diet. They will call also tomorrow cardiology office for further instructions.

## 2017-02-20 NOTE — Telephone Encounter (Signed)
Once he is out of rehab we can do this.

## 2017-02-21 ENCOUNTER — Ambulatory Visit (INDEPENDENT_AMBULATORY_CARE_PROVIDER_SITE_OTHER): Payer: Medicare Other | Admitting: Urology

## 2017-02-21 ENCOUNTER — Encounter: Payer: Self-pay | Admitting: Urology

## 2017-02-21 VITALS — BP 100/61 | HR 76 | Ht 72.0 in | Wt 249.4 lb

## 2017-02-21 DIAGNOSIS — N401 Enlarged prostate with lower urinary tract symptoms: Secondary | ICD-10-CM

## 2017-02-21 DIAGNOSIS — R338 Other retention of urine: Secondary | ICD-10-CM

## 2017-02-21 DIAGNOSIS — R310 Gross hematuria: Secondary | ICD-10-CM

## 2017-02-21 LAB — BLADDER SCAN AMB NON-IMAGING: Scan Result: 146

## 2017-02-21 NOTE — Telephone Encounter (Signed)
Spoke with nurse at [facility] and she reports that they weight patient last night and because his weight had increased after second dose of furosemide they called physician on call that ordered IV dose of Furosemide which they are not able to do there and so it was changed to IM dose but patient refused. She reports that patients weight is down today to 249 and yesterday he was 251.4. She just wanted to make [place] aware that patient did in fact refuse the IM dose of furosemide last night and would like to remove the order from his chart there. Will route this note over to them showing that we are aware of his refusal and that they will remove it from his list. Instructed her to give [place] a call back if they have any further needs.

## 2017-02-21 NOTE — Telephone Encounter (Signed)
Nurse from [facility] called stating they can't do IV for lasix on patient need [place] to change the order and resend it  Pt weight today is 249 yesterday was 251  Fax: [phone]

## 2017-02-21 NOTE — Telephone Encounter (Signed)
Telephone encounter routed over to fax number provided.

## 2017-02-21 NOTE — Telephone Encounter (Signed)
Marco [name]

## 2017-02-21 NOTE — Progress Notes (Signed)
02/21/2017 12:03 PM   Marco Lutes Sr. 04-Dec-1935 [id number]  Referring provider: [name], MD [address] [place], [place] [address]  Chief Complaint  Patient presents with  . Hematuria    HPI: The patient is a 81 year old gentleman with a past medical history of gross hematuria with workup revealing prostate varicoses and no malignancy and BPH on finasteride presents today for follow-up after his Foley catheter was removed 1 week ago. His PVR is 146 cc. The Foley catheter was placed for acute urinary retention while he was then the hospital. He had 1,400 cc in his bladder that time. He is currently on finasteride. He was unable to tolerate Flomax due to orthostatic hypotension. Due to this orthostatic hypotension, he was immobilized unable to use the bathroom except for while lying in bed. This is what caused him to be unable to urinate. Once he was cleared to and including, he reports that he voids well. He feels that he empties his bladder with a good stream. He has no other complaints at this time.   PMH: Past Medical History:  Diagnosis Date  . AAA (abdominal aortic aneurysm)repaired    a. 12/2006 s/p repair of 6cm infrarenal AAA with 44mm Dacron graft.  . Anemia   . Anginal pain ([place])   . Atrial fibrillation/Flutter    a. Dx 2014;  b. 10/2012 s/p TEE/DCCV;  c. On amio and Eliquis (CHA2DS2VASc = 6).  . Basal cell carcinoma    a. 2013 x 2 back and left neck  . BPH (benign prostatic hypertrophy)   . Carotid artery occlusion    a. 11/2008 s/p R CEA; b. 01/2017 Carotid U/S: RICA <94%, LICA >70%.  . Chronic systolic heart failure ([place])    a. 02/2012 Echo: EF 30-35%, Pulm HTN, and modest RV dysfunction;  b. 10/2012 TEE: EF 25-30%, mild conc LVH, diff HK, mod RV dysfxn, mod dil RA/LA, mild MR, no MS, nl MVR, mod TR, Ao sclerosis; c. 01/2017 Echo: EF 45-50%, diff HK, triv MR, mod dil LA.  . CKD (chronic kidney disease), stage IV ([place])   . Coronary  artery disease    a. 1999 PCI RCA;  b. 01/2005 NSTEMI: CABG x 3 (LIMA->LAD, VG->OM, VG->LPL) @ time of MVR.  [name] DVT (deep venous thrombosis) ([place])   . GERD (gastroesophageal reflux disease)   . History of hiatal hernia   . History of shingles   . Hyperlipidemia   . Hypertensive heart disease   . Mitral regurgitation--s/p repair    a. 01/2005 MVR: 28-mm Edwards ET Logix ring annuloplasty; b. 10/2012 TEE: normal fxning MV ring with mild MR, no MS; 01/2017 Echo: Triv MR.  . Mixed Ischemic and Non-ischemic Cardiomyopathy    a. 02/2012 Echo: EF 30-35%, Pulm HTN, and modest RV dysfunction;  b. 10/2012 TEE: EF 25-30%, mild conc LVH, diff HK, mod RV dysfxn, mod dil RA/LA, mild MR, no MS, nl MVR, mod TR, Ao sclerosis; c. 01/2017 Echo: EF 45-50%.  . Myocardial infarction ([place])    2006  . Obesity   . Orthopnea   . Orthostatic hypotension    a. 11/2016 -> meds reduced;  b. 01/2017 admitted w/ presyncope and orthostasis->Meds held.  . Oxygen deficit    a. Wears 2 lpm most of the day and @ HS.  . Presence of permanent cardiac pacemaker   . PVC (premature ventricular contraction)    a. 02/2009 s/p RFCA ([place]).  . Sick sinus syndrome ([place])  a. 01/2005 s/p PPM 2/2 heart block following CABG;  b. 05/2012 Gen Change: MDT Adapta ADDRL1 DC PPM, ser # [id number].  . Sleep apnea    a. Intolerant of CPAP  . Type II diabetes mellitus ([place])     Surgical History: Past Surgical History:  Procedure Laterality Date  . ABDOMINAL AORTIC ANEURYSM REPAIR      May 2008,   . CARDIAC CATHETERIZATION  2006   @ [facility]  . CATARACT EXTRACTION     right  . CATARACT EXTRACTION W/PHACO Left 10/04/2016   Procedure: CATARACT EXTRACTION PHACO AND INTRAOCULAR LENS PLACEMENT (IOC);  Surgeon: Estill Cotta, MD;  Location: [facility] ORS;  Service: Ophthalmology;  Laterality: Left;  [place] 01:21 AP% 25.9 CDE 39.08 Fluid pack lot # [id number]   . CORONARY ARTERY BYPASS GRAFT     CABG with a  LIMA to the LAD, SVG to circumflex, SVG to  posterolateral [id number].  Randolm Idol / REPLACE / REMOVE PACEMAKER  2006   Dr. [name]  . MITRAL VALVE REPAIR  2006    mitral valve repair with a #28 Edwards logic ring  . PACEMAKER PLACEMENT    . PERMANENT PACEMAKER GENERATOR CHANGE N/A 06/26/2012   Procedure: PERMANENT PACEMAKER GENERATOR CHANGE;  Surgeon: Deboraha Sprang, MD;  Location: [facility] CATH LAB;  Service: Cardiovascular;  Laterality: N/A;  . SKIN BIOPSY     left wrist; right elbow  . SKIN CANCER EXCISION      x 6  . SKIN CANCER EXCISION    . TRANSESOPHAGEAL ECHOCARDIOGRAM    . VASECTOMY      Home Medications:  Allergies as of 02/21/2017      Reactions   Shellfish Allergy Anaphylaxis      Medication List       Accurate as of 02/21/17 12:03 PM. Always use your most recent med list.          ACCU-CHEK FASTCLIX LANCETS Misc TEST twice a day if needed   ACCU-CHEK SMARTVIEW test strip Generic drug:  glucose blood   ACCU-CHEK SMARTVIEW test strip Generic drug:  glucose blood TEST twice a day   albuterol (2.5 MG/3ML) 0.083% nebulizer solution Commonly known as:  PROVENTIL Take 3 mLs (2.5 mg total) by nebulization every 6 (six) hours as needed for wheezing or shortness of breath.   amiodarone 200 MG tablet Commonly known as:  PACERONE take 1 tablet by mouth once daily   BD INSULIN SYRINGE ULTRAFINE 31G X 5/16" 0.3 ML Misc Generic drug:  Insulin Syringe-Needle U-100   BD INSULIN SYRINGE ULTRAFINE 31G X 5/16" 0.3 ML Misc Generic drug:  Insulin Syringe-Needle U-100 use twice a day as directed   calcitRIOL 0.5 MCG capsule Commonly known as:  ROCALTROL take 1 capsule by mouth once daily   cholecalciferol 1000 units tablet Commonly known as:  VITAMIN D Take 1,000 Units by mouth daily.   docusate sodium 100 MG capsule Commonly known as:  COLACE Take 100 mg by mouth 2 (two) times daily.   ELIQUIS 5 MG Tabs tablet Generic drug:  apixaban take 1 tablet by mouth twice a day   finasteride 5 MG tablet Commonly known  as:  PROSCAR Take 1 tablet (5 mg total) by mouth daily.   fludrocortisone 0.1 MG tablet Commonly known as:  FLORINEF Take 1 tablet (0.1 mg total) by mouth daily.   furosemide 40 MG tablet Commonly known as:  LASIX One tablet daily for weight gain of 3 pounds in one day or five pounds in  one week   insulin lispro protamine-lispro (75-25) 100 UNIT/ML Susp injection Commonly known as:  HUMALOG MIX 75/25 Inject 5 Units into the skin 2 (two) times daily with a meal.   losartan 25 MG tablet Commonly known as:  COZAAR 0.5 tablets daily orally   midodrine 10 MG tablet Commonly known as:  PROAMATINE Take 1 tablet (10 mg total) by mouth 3 (three) times daily with meals.   mometasone 0.1 % ointment Commonly known as:  ELOCON Apply 1 application topically 2 (two) times daily.   MULTI-VITAMIN GUMMIES Chew Chew by mouth daily.   NASACORT ALLERGY 24HR 55 MCG/ACT Aero nasal inhaler Generic drug:  triamcinolone Place 2 sprays into the nose daily.   nystatin powder Commonly known as:  MYCOSTATIN/NYSTOP Apply topically 2 (two) times daily.   omeprazole 20 MG capsule Commonly known as:  PRILOSEC Take 20 mg by mouth daily. Monday, Wednesday, friday   OXYGEN Inhale 1.5 L into the lungs.   polyethylene glycol packet Commonly known as:  MIRALAX / GLYCOLAX Take 17 g by mouth daily as needed for mild constipation.   rosuvastatin 20 MG tablet Commonly known as:  CRESTOR take 1 tablet by mouth at bedtime   traZODone 50 MG tablet Commonly known as:  DESYREL Take 1 tablet (50 mg total) by mouth at bedtime as needed for sleep.       Allergies:  Allergies  Allergen Reactions  . Shellfish Allergy Anaphylaxis    Family History: Family History  Problem Relation Age of Onset  . Heart attack Mother 51  . Diabetes Sister   . Hypertension Sister   . Heart attack Sister   . Heart attack Brother 51       3 Brothers deceased from [place] in 73's or 29's  . Heart disease Brother         Heart Disease before age 48  . Heart attack Father 45  . Diabetes Father        Amputation: bilateral feet  . Heart disease Father   . Heart attack Sister   . Heart attack Sister   . Heart attack Brother 12  . Heart attack Brother 27  . Diabetes Son     Social History:  reports that he quit smoking about 29 years ago. His smoking use included Cigarettes. He has a 51.00 pack-year smoking history. He has never used smokeless tobacco. He reports that he drinks about 0.6 oz of alcohol per week . He reports that he does not use drugs.  ROS: UROLOGY Frequent Urination?: Yes Hard to postpone urination?: No Burning/pain with urination?: No Get up at night to urinate?: Yes Leakage of urine?: No Urine stream starts and stops?: No Trouble starting stream?: Yes Do you have to strain to urinate?: No Blood in urine?: No Urinary tract infection?: No Sexually transmitted disease?: No Injury to kidneys or bladder?: No Painful intercourse?: No Weak stream?: Yes Erection problems?: No Penile pain?: No  Gastrointestinal Nausea?: No Vomiting?: No Indigestion/heartburn?: No Diarrhea?: No Constipation?: No  Constitutional Fever: No Night sweats?: No Weight loss?: No Fatigue?: No  Skin Skin rash/lesions?: No Itching?: No  Eyes Blurred vision?: No Double vision?: No  Ears/Nose/Throat Sore throat?: No Sinus problems?: No  Hematologic/Lymphatic Swollen glands?: No Easy bruising?: No  Cardiovascular Leg swelling?: No Chest pain?: No  Respiratory Cough?: No Shortness of breath?: Yes  Endocrine Excessive thirst?: Yes  Musculoskeletal Back pain?: No Joint pain?: No  Neurological Headaches?: No Dizziness?: No  Psychologic Depression?: No Anxiety?:  No  Physical Exam: BP 100/61   Pulse 76   Ht 6' (1.829 m)   Wt 249 lb 7.2 oz (113.1 kg)   BMI 33.83 kg/m   Constitutional:  Alert and oriented, No acute distress. HEENT: [place] AT, moist mucus membranes.  Trachea  midline, no masses. Cardiovascular: No clubbing, cyanosis, or edema. Respiratory: Normal respiratory effort, no increased work of breathing. GI: Abdomen is soft, nontender, nondistended, no abdominal masses GU: No CVA tenderness.  Skin: No rashes, bruises or suspicious lesions. Lymph: No cervical or inguinal adenopathy. Neurologic: Grossly intact, no focal deficits, moving all 4 extremities. Psychiatric: Normal mood and affect.  Laboratory Data: Lab Results  Component Value Date   WBC 7.1 02/07/2017   HGB 11.2 (L) 02/07/2017   HCT 34.0 (L) 02/07/2017   MCV 93.3 02/07/2017   PLT 163 02/07/2017    Lab Results  Component Value Date   CREATININE 2.67 (H) 02/07/2017    Lab Results  Component Value Date   PSA 0.5 10/01/2014    No results found for: TESTOSTERONE  Lab Results  Component Value Date   HGBA1C 6.1 (H) 02/02/2017    Urinalysis    Component Value Date/Time   COLORURINE YELLOW (A) 02/01/2017 2211   APPEARANCEUR HAZY (A) 02/01/2017 2211   APPEARANCEUR Clear 03/13/2014 1317   LABSPEC 1.012 02/01/2017 2211   LABSPEC 1.014 03/13/2014 1317   PHURINE 6.0 02/01/2017 2211   GLUCOSEU 150 (A) 02/01/2017 2211   GLUCOSEU 50 mg/dL 03/13/2014 1317   HGBUR LARGE (A) 02/01/2017 2211   BILIRUBINUR NEGATIVE 02/01/2017 2211   BILIRUBINUR Negative 03/13/2014 1317   KETONESUR NEGATIVE 02/01/2017 2211   PROTEINUR 30 (A) 02/01/2017 2211   UROBILINOGEN 1.0 12/11/2008 1041   NITRITE NEGATIVE 02/01/2017 2211   LEUKOCYTESUR MODERATE (A) 02/01/2017 2211   LEUKOCYTESUR Negative 03/13/2014 1317    Assessment & Plan:    1. BPH with history of urinary retention -Continue finasteride per PCP -Retention resolved. Likely was secondary to immobilization while in the hospital. Follow-up when necessary.  Return if symptoms worsen or fail to improve.  Nickie Retort, MD  [facility] [address] [place], [place] [address] [phone]

## 2017-02-22 ENCOUNTER — Other Ambulatory Visit: Payer: Self-pay | Admitting: *Deleted

## 2017-02-22 DIAGNOSIS — Z794 Long term (current) use of insulin: Secondary | ICD-10-CM | POA: Diagnosis not present

## 2017-02-22 DIAGNOSIS — L97522 Non-pressure chronic ulcer of other part of left foot with fat layer exposed: Secondary | ICD-10-CM | POA: Diagnosis not present

## 2017-02-22 DIAGNOSIS — L97511 Non-pressure chronic ulcer of other part of right foot limited to breakdown of skin: Secondary | ICD-10-CM | POA: Diagnosis not present

## 2017-02-22 DIAGNOSIS — E1140 Type 2 diabetes mellitus with diabetic neuropathy, unspecified: Secondary | ICD-10-CM | POA: Diagnosis not present

## 2017-02-22 NOTE — Telephone Encounter (Signed)
Would change lasix to torsemide Would take torsemide 40 mg for weight 248 lbs or over  Take torsemide 20 mg for weight <248

## 2017-02-22 NOTE — Patient Outreach (Signed)
[facility] [facility]) Care Management  02/22/2017  Cass Vandermeulen Sr. May 25, 1936 [id number]   Patient reports he is going home tomorrow. He will have [facility] to assist with wounds, hopes to go to outpatient therapy in a couple of weeks.   RNCM reminded patient that [facility] would be calling after he discharges home, he agrees to calls and confirms he wants to participate.  [place], SW know that patient will have [facility] program services in addition to home care.   Plan to let [place] know of discharge date for Transition of care [facility]. Laymond Purser, RN, BSN, [place] [phone]) Business Cell  [phone]) Toll Free Office

## 2017-02-23 MED ORDER — TORSEMIDE 20 MG PO TABS: 20.0000 mg | ORAL_TABLET | ORAL | 3 refills | Status: DC

## 2017-02-23 NOTE — Telephone Encounter (Signed)
Prescription would not send electronically so called in and spoke with pharmacist Amber to give verbal order with detailed instructions. She confirmed and read back all instructions and stated that she would review with patient and his wife when they come in to pick it up.

## 2017-02-23 NOTE — Telephone Encounter (Signed)
Spoke with [place] at [facility] and reviewed Dr. Donivan Scull recommendations to discontinue the Furosemide and start Torsemide 40 mg for weight 248 lbs or over and take Torsemide 20 mg for weight less than 248. She verbalized understanding and states that patient is being discharged today. Asked that she review this with patient and family and to call if any questions. She was appreciative for the call and had no further questions at this time.

## 2017-02-26 ENCOUNTER — Other Ambulatory Visit: Payer: Self-pay | Admitting: *Deleted

## 2017-02-26 ENCOUNTER — Encounter: Payer: Self-pay | Admitting: *Deleted

## 2017-02-26 ENCOUNTER — Telehealth: Payer: Self-pay | Admitting: Family Medicine

## 2017-02-26 DIAGNOSIS — R262 Difficulty in walking, not elsewhere classified: Secondary | ICD-10-CM | POA: Diagnosis not present

## 2017-02-26 DIAGNOSIS — R531 Weakness: Secondary | ICD-10-CM | POA: Diagnosis not present

## 2017-02-26 DIAGNOSIS — E11621 Type 2 diabetes mellitus with foot ulcer: Secondary | ICD-10-CM | POA: Diagnosis not present

## 2017-02-26 DIAGNOSIS — M6281 Muscle weakness (generalized): Secondary | ICD-10-CM | POA: Diagnosis not present

## 2017-02-26 DIAGNOSIS — L89302 Pressure ulcer of unspecified buttock, stage 2: Secondary | ICD-10-CM | POA: Diagnosis not present

## 2017-02-26 NOTE — Telephone Encounter (Signed)
Marco [name] with [facility] called states they have stated home health.  Pt will require RN nursing and physical therapy.  He also wanted to advise pt fell on Friday and has a skin tear on his left arm.  Marco [name] will fax o verbal order for signature.  CB#[phone]/MW

## 2017-02-26 NOTE — Telephone Encounter (Signed)
See below-aa 

## 2017-02-26 NOTE — Patient Outreach (Addendum)
[facility] Care Management  02/26/2017  Marco [name]. 06-16-1936 [id number]  Referral for Transition of care Discharged from [facility] on 6/29 to home. DX: Weakness, orthostatic hypotension, chronic heart failure .   Placed call to patient, he answered the phone and identification verified, he stated he was short of breath and stating he would have me speak with his wife [name]. She reports home health is visiting at this time, I offered to return call later today and she is agreeable.   [facility] [facility]) Care Management  [place]  02/26/2017   Marco [name]. April 14, 1936 [id number]   1600 Returned call to patient  Subjective:  Spoke with patient's wife , Malakie Balis [place].  She discussed patient tires easily and gets a little short of breath after walking. Patient uses oxygen at 2 liters, uses rolling walker when ambulating in home, without complaint of dizziness today's blood pressure 120/62 . Patient's weight today is 245, no increase in weight gain, wife states patient is working toward getting back on his schedule of weighing Monday, Wednesday and Friday, using [facility] scale system.   Mrs.Meals uses a pill organizer to prepare patient medication and denies concerns related to cost.  [facility] home health had initial home visit on today. Wife discussed wounds to toe dressing that she changes daily, and is on amoxicillin daily until next visit with podiatrist.  Patient monitors blood sugar daily today's reading 146.   Patient has scheduled visit with cardiologist on this week and with PCP on next week.    Encounter Medications:  Outpatient Encounter Prescriptions as of 02/26/2017  Medication Sig Note  . ACCU-CHEK FASTCLIX LANCETS MISC TEST twice a day if needed   . ACCU-CHEK SMARTVIEW test strip    . ACCU-CHEK SMARTVIEW test strip TEST twice a day   . amiodarone (PACERONE) 200 MG tablet take 1 tablet by mouth once  daily   . BD INSULIN SYRINGE ULTRAFINE 31G X 5/16" 0.3 ML MISC    . BD INSULIN SYRINGE ULTRAFINE 31G X 5/16" 0.3 ML MISC use twice a day as directed   . calcitRIOL (ROCALTROL) 0.5 MCG capsule take 1 capsule by mouth once daily   . cholecalciferol (VITAMIN D) 1000 UNITS tablet Take 1,000 Units by mouth daily.   [name] docusate sodium (COLACE) 100 MG capsule Take 100 mg by mouth 2 (two) times daily.     [name] ELIQUIS 5 MG TABS tablet take 1 tablet by mouth twice a day   . finasteride (PROSCAR) 5 MG tablet Take 1 tablet (5 mg total) by mouth daily.   . insulin lispro protamine-lispro (HUMALOG MIX 75/25) (75-25) 100 UNIT/ML SUSP injection Inject 5 Units into the skin 2 (two) times daily with a meal.   . losartan (COZAAR) 25 MG tablet 0.5 tablets daily orally   . mometasone (ELOCON) 0.1 % ointment Apply 1 application topically 2 (two) times daily.   . Multiple Vitamins-Minerals (MULTI-VITAMIN GUMMIES) CHEW Chew by mouth daily.   [name] nystatin (MYCOSTATIN/NYSTOP) powder Apply topically 2 (two) times daily.   [name] omeprazole (PRILOSEC) 20 MG capsule Take 20 mg by mouth daily. Monday, Wednesday, friday   . OXYGEN Inhale 1.5 L into the lungs.   . polyethylene glycol (MIRALAX / GLYCOLAX) packet Take 17 g by mouth daily as needed for mild constipation.   . rosuvastatin (CRESTOR) 20 MG tablet take 1 tablet by mouth at bedtime   . torsemide (DEMADEX) 20 MG tablet Take 1-2 tablets (  20-40 mg total) by mouth as directed. 20 mg once daily when weight is less than 248. Take 40 mg (2 tablets) once daily when weight is 248 or higher.   . triamcinolone (NASACORT ALLERGY 24HR) 55 MCG/ACT AERO nasal inhaler Place 2 sprays into the nose daily.   [name] albuterol (PROVENTIL) (2.5 MG/3ML) 0.083% nebulizer solution Take 3 mLs (2.5 mg total) by nebulization every 6 (six) hours as needed for wheezing or shortness of breath. (Patient not taking: Reported on 02/26/2017)   . fludrocortisone (FLORINEF) 0.1 MG tablet Take 1 tablet (0.1 mg total) by  mouth daily. (Patient not taking: Reported on 02/26/2017)   . midodrine (PROAMATINE) 10 MG tablet Take 1 tablet (10 mg total) by mouth 3 (three) times daily with meals. (Patient not taking: Reported on 02/26/2017)   . traZODone (DESYREL) 50 MG tablet Take 1 tablet (50 mg total) by mouth at bedtime as needed for sleep. (Patient not taking: Reported on 02/26/2017) 02/26/2017: Not taking    No facility-administered encounter medications on file as of 02/26/2017.   Patient was recently discharged from hospital and all medications have been reviewed.  Functional Status:  In your present state of health, do you have any difficulty performing the following activities: 02/02/2017 06/10/2016  Hearing? N N  Vision? N Y  Difficulty concentrating or making decisions? N N  Walking or climbing stairs? Y Y  Dressing or bathing? Y N  Doing errands, shopping? N Y  Some recent data might be hidden    Fall/Depression Screening: Fall Risk  02/22/2016  Falls in the past year? No   PHQ 2/9 Scores 02/22/2016  PHQ - 2 Score 3  PHQ- 9 Score 9    Assessment:  Patient and wife agreeable to weekly transition of care outreaches. Wife denies any new concerns at this time.   Plan:  Plan weekly transition of care outreach call in the next week. Contact information provided .  Reviewed symptoms of worsening heart failure to notify MD of or any new concerns .  Will send MD barrier letter.    [name], RN, [place] Management [phone]- Mobile [phone]- Toll Free Main Office

## 2017-02-27 ENCOUNTER — Telehealth: Payer: Self-pay

## 2017-02-27 ENCOUNTER — Ambulatory Visit: Payer: Medicare Other | Admitting: Internal Medicine

## 2017-02-27 DIAGNOSIS — R262 Difficulty in walking, not elsewhere classified: Secondary | ICD-10-CM | POA: Diagnosis not present

## 2017-02-27 DIAGNOSIS — M6281 Muscle weakness (generalized): Secondary | ICD-10-CM | POA: Diagnosis not present

## 2017-02-27 DIAGNOSIS — E11621 Type 2 diabetes mellitus with foot ulcer: Secondary | ICD-10-CM | POA: Diagnosis not present

## 2017-02-27 DIAGNOSIS — R531 Weakness: Secondary | ICD-10-CM | POA: Diagnosis not present

## 2017-02-27 DIAGNOSIS — L89302 Pressure ulcer of unspecified buttock, stage 2: Secondary | ICD-10-CM | POA: Diagnosis not present

## 2017-02-27 NOTE — Telephone Encounter (Signed)
ok 

## 2017-02-27 NOTE — Telephone Encounter (Signed)
Marco [name] with [facility] home [facility] called to let us know that patient's OT evaluation will be delayed for 1 day due to July 4th holiday, will see patient on Thursday 03/01/17. No call back is needed to this message -aa

## 2017-02-28 NOTE — Progress Notes (Signed)
Cardiology Office Note  Date:  03/01/2017   ID:  Marco Lutes Sr., DOB June 27, 1936, MRN [id number]  PCP:  [name], MD   Chief Complaint  Patient presents with  . other    Follow up from [facility]; decreased BP. Meds reviewed by the pt.'s med list. Pt. c/o shortness of breath.     HPI:  Marco [name] is a very pleasant 81 year old gentleman with a history of  coronary artery disease, CABG, MVR Echo 04/2016 with EF 25 to 30%, RVSP 47 Paroxysmal atrial fibrillation  DVT, on warfarin,  diabetes,  smoking history  PVD, carotid endarterectomy, AAA repair May 2008 sick sinus syndrome, Pacemaker is followed by Dr. [name] systolic and diastolic CHF Baseline creatinine 2.5, now up to 3 LIMA to the LAD, vein graft to the circumflex, vein graft to the PL in June 2006  mitral valve replacement with #28 Edwards logic ring S/p Pacemaker placement History of orthostasis He presents today for follow-up of Marco [name] coronary artery disease  Hospitalization early June 2018 for orthostasis Recently at [facility], now home 243.4 pounds at home We received a phone call that weight was up 10 [facility], he was changed from Lasix to torsemide 40 mg daily He is currently Taking torsemide 20 daily and lasix 20. Some reason did not stop the Lasix Weight stable Using compresison hose No significant leg edema on today's visit  Walking into the clinic today had some fatigue,  Initial blood pressure was low but on recheck was 272 systolic over 60  EKG personally reviewed by myself on todays visit Shows paced rhythm rate 76 bpm  Other past medical history reviewed blood pressure was low on previous office visit  recommended he decrease Lasix to QOD, carvedilol to 3.25 mg, hold losartan  he restarted losartan every other day Systolic pressure in the office around 100  Blood pressure on today's visit checked by myself 80 systolic bilaterally Nurses got 90/50 He was dizzy on  the exam table, had to get back in Marco [name] wheelchair Legs very weak, needing assistance to get up on the table Wife reports he uses walker at home  Seen by nephrology, Dr Holley Raring Leg weakness, previously did home PT  Labs reviewed cholesterol 141, LDL 77 (02/2016) Hemoglobin A1c 6.4 in 2/18  Other past medical history reviewed hospitalization March 2017 for gastroenteritis  Previous episodes of near syncope, lightheadedness, improved symptoms by holding Marco [name] hydralazine and isosorbide  left hand weakness started in December 2014. carotid angiogram showed patent right carotid endarterectomy site, moderate disease on the left etiology of Marco [name] symptoms was unclear. carotid disease monitored by Dr. Ronalee Belts.  Echocardiogram in the hospital 03/12/2012 showed moderate right ventricular pressures 50-60 mmHg consistent with moderate pulmonary hypertension, ejection fraction 30-35%, moderately dilated left atrium, dilated left ventricle.   PMH:   has a past medical history of AAA (abdominal aortic aneurysm)repaired; Anemia; Anginal pain ([place]); Atrial fibrillation/Flutter; Basal cell carcinoma; BPH (benign prostatic hypertrophy); Carotid artery occlusion; Chronic systolic heart failure (HCC); CKD (chronic kidney disease), stage IV ([place]); Coronary artery disease; DVT (deep venous thrombosis) ([place]); GERD (gastroesophageal reflux disease); History of hiatal hernia; History of shingles; Hyperlipidemia; Hypertensive heart disease; Mitral regurgitation--s/p repair; Mixed Ischemic and Non-ischemic Cardiomyopathy; Myocardial infarction ([place]); Obesity; Orthopnea; Orthostatic hypotension; Oxygen deficit; Presence of permanent cardiac pacemaker; PVC (premature ventricular contraction); Sick sinus syndrome ([place]); Sleep apnea; and Type II diabetes mellitus ([place]).  PSH:    Past Surgical History:  Procedure Laterality Date  .  ABDOMINAL AORTIC ANEURYSM REPAIR      May 2008,   . CARDIAC CATHETERIZATION  2006   @  [facility]  . CATARACT EXTRACTION     right  . CATARACT EXTRACTION W/PHACO Left 10/04/2016   Procedure: CATARACT EXTRACTION PHACO AND INTRAOCULAR LENS PLACEMENT (IOC);  Surgeon: Estill Cotta, MD;  Location: [facility] ORS;  Service: Ophthalmology;  Laterality: Left;  [place] 01:21 AP% 25.9 CDE 39.08 Fluid pack lot # [id number]   . CORONARY ARTERY BYPASS GRAFT     CABG with a  LIMA to the LAD, SVG to circumflex, SVG to posterolateral [id number].  Randolm Idol / REPLACE / REMOVE PACEMAKER  2006   Dr. [name]  . MITRAL VALVE REPAIR  2006    mitral valve repair with a #28 Edwards logic ring  . PACEMAKER PLACEMENT    . PERMANENT PACEMAKER GENERATOR CHANGE N/A 06/26/2012   Procedure: PERMANENT PACEMAKER GENERATOR CHANGE;  Surgeon: Deboraha Sprang, MD;  Location: [facility] CATH LAB;  Service: Cardiovascular;  Laterality: N/A;  . SKIN BIOPSY     left wrist; right elbow  . SKIN CANCER EXCISION      x 6  . SKIN CANCER EXCISION    . TRANSESOPHAGEAL ECHOCARDIOGRAM    . VASECTOMY      Current Outpatient Prescriptions  Medication Sig Dispense Refill  . ACCU-CHEK FASTCLIX LANCETS MISC TEST twice a day if needed 102 each 12  . ACCU-CHEK SMARTVIEW test strip   1  . ACCU-CHEK SMARTVIEW test strip TEST twice a day 300 each 12  . albuterol (PROVENTIL) (2.5 MG/3ML) 0.083% nebulizer solution Take 3 mLs (2.5 mg total) by nebulization every 6 (six) hours as needed for wheezing or shortness of breath. 75 mL 0  . amiodarone (PACERONE) 200 MG tablet take 1 tablet by mouth once daily 30 tablet 3  . amoxicillin-clavulanate (AUGMENTIN) 875-125 MG tablet Take 1 tablet by mouth 2 (two) times daily.     . BD INSULIN SYRINGE ULTRAFINE 31G X 5/16" 0.3 ML MISC   1  . BD INSULIN SYRINGE ULTRAFINE 31G X 5/16" 0.3 ML MISC use twice a day as directed 100 each 12  . calcitRIOL (ROCALTROL) 0.5 MCG capsule take 1 capsule by mouth once daily 90 capsule 3  . cholecalciferol (VITAMIN D) 1000 UNITS tablet Take 1,000 Units by mouth daily.    [name] docusate  sodium (COLACE) 100 MG capsule Take 100 mg by mouth 2 (two) times daily.      [name] ELIQUIS 5 MG TABS tablet take 1 tablet by mouth twice a day 60 tablet 11  . insulin lispro protamine-lispro (HUMALOG MIX 75/25) (75-25) 100 UNIT/ML SUSP injection Inject 5 Units into the skin 2 (two) times daily with a meal. 10 mL 0  . losartan (COZAAR) 25 MG tablet 0.5 tablets daily orally 15 tablet 0  . mometasone (ELOCON) 0.1 % ointment Apply 1 application topically 2 (two) times daily.  0  . Multiple Vitamins-Minerals (MULTI-VITAMIN GUMMIES) CHEW Chew by mouth daily.    [name] nystatin (MYCOSTATIN/NYSTOP) powder Apply topically 2 (two) times daily. 15 g 0  . omeprazole (PRILOSEC) 20 MG capsule Take 20 mg by mouth daily. Monday, Wednesday, friday    . OXYGEN Inhale 1.5 L into the lungs.    . polyethylene glycol (MIRALAX / GLYCOLAX) packet Take 17 g by mouth daily as needed for mild constipation. 14 each 0  . rosuvastatin (CRESTOR) 20 MG tablet take 1 tablet by mouth at bedtime 90 tablet 3  . torsemide (DEMADEX)  20 MG tablet Take 1-2 tablets (20-40 mg total) by mouth as directed. 20 mg once daily when weight is less than 248. Take 40 mg (2 tablets) once daily when weight is 248 or higher. 180 tablet 3  . triamcinolone (NASACORT ALLERGY 24HR) 55 MCG/ACT AERO nasal inhaler Place 2 sprays into the nose daily.     No current facility-administered medications for this visit.      Allergies:   Shellfish allergy   Social History:  The patient  reports that he quit smoking about 29 years ago. Marco [name] smoking use included Cigarettes. He has a 51.00 pack-year smoking history. He has never used smokeless tobacco. He reports that he drinks about 0.6 oz of alcohol per week . He reports that he does not use drugs.   Family History:   family history includes Diabetes in Marco [name] father, sister, and son; Heart attack in Marco [name] sister, sister, and sister; Heart attack (age of onset: 93) in Marco [name] brother; Heart attack (age of onset: 45) in Marco [name]  brother; Heart attack (age of onset: 55) in Marco [name] brother; Heart attack (age of onset: 83) in Marco [name] father; Heart attack (age of onset: 55) in Marco [name] mother; Heart disease in Marco [name] brother and father; Hypertension in Marco [name] sister.    Review of Systems: Review of Systems  Constitutional: Negative.   Respiratory: Negative.   Cardiovascular: Negative.   Gastrointestinal: Negative.   Musculoskeletal: Negative.        Gait instability, leg weakness  Neurological: Positive for dizziness.  Psychiatric/Behavioral: Negative.   All other systems reviewed and are negative.    PHYSICAL EXAM: VS:  BP 100/60 (BP Location: Right Arm, Patient Position: Sitting, Cuff Size: Normal)   Pulse 76   Ht 6' (1.829 m)   Wt 243 lb 5 oz (110.4 kg)   SpO2 94%   BMI 33.00 kg/m  , BMI Body mass index is 33 kg/m. GEN: Well nourished, well developed, in no acute distress, obese  HEENT: normal  Neck: no JVD, carotid bruits, or masses Cardiac: RRR; no murmurs, rubs, or gallops,no edema  Respiratory:  clear to auscultation bilaterally, normal work of breathing GI: soft, nontender, nondistended, + BS MS: no deformity or atrophy  Skin: warm and dry, no rash Neuro:  Strength and sensation are intact Psych: euthymic mood, full affect    Recent Labs: 02/01/2017: ALT 18 02/02/2017: TSH 2.049 02/07/2017: BUN 47; Creatinine, Ser 2.67; Hemoglobin 11.2; Platelets 163; Potassium 4.0; Sodium 143 02/12/2017: B Natriuretic Peptide 200.0    Lipid Panel Lab Results  Component Value Date   CHOL 114 02/02/2017   HDL 37 (L) 02/02/2017   LDLCALC 66 02/02/2017   TRIG 56 02/02/2017      Wt Readings from Last 3 Encounters:  03/01/17 243 lb 5 oz (110.4 kg)  02/21/17 249 lb 7.2 oz (113.1 kg)  02/07/17 250 lb 6.4 oz (113.6 kg)       ASSESSMENT AND PLAN:  Mixed hyperlipidemia - Plan: EKG 12-Lead Cholesterol is at goal on the current lipid regimen. No changes to the medications were made.  Coronary artery disease of native  artery of native heart with stable angina pectoris ([place]) - Plan: EKG 12-Lead Currently with no symptoms of angina. No further workup at this time. Continue current medication regimen.  Ischemic cardiomyopathy  s/p CABG - Plan: EKG 12-Lead No room to advance Marco [name] medical therapy given hypotension  PAF (paroxysmal atrial fibrillation) ([place]) - Plan: EKG 12-Lead On anticoagulation, paced rhythm High fall risk  Chronic  systolic heart failure ([place]) - Plan: EKG 12-Lead Appears euvolemic. Recommended he hold Lasix only take torsemide 20 mg daily Suggested he increase up to 20 mg twice a day for 5 pound weight gain Would hold torsemide for hypotension, orthostasis symptoms  Morbid obesity (HCC) Unable to exercise, he is trying to watch Marco [name] diet  Pacemaker-Medtronic - Plan: EKG 12-Lead Followed by Dr. [name]  S/P CABG (coronary artery bypass graft) - Plan: EKG 12-Lead Denies having anginal symptoms No further testing  Stage 4 chronic renal impairment associated with type 2 diabetes mellitus ([place]) Followed by nephrology   Disposition:   F/U  6 months   Total encounter time more than 25 minutes  Greater than 50% was spent in counseling and coordination of care with the patient    Orders Placed This Encounter  Procedures  . EKG 12-Lead     Signed, Esmond Plants, M.D., Ph.D. 03/01/2017  [place], [place]

## 2017-03-01 ENCOUNTER — Telehealth: Payer: Self-pay | Admitting: Emergency Medicine

## 2017-03-01 ENCOUNTER — Ambulatory Visit (INDEPENDENT_AMBULATORY_CARE_PROVIDER_SITE_OTHER): Payer: Medicare Other | Admitting: Cardiovascular Disease

## 2017-03-01 ENCOUNTER — Encounter: Payer: Self-pay | Admitting: Cardiovascular Disease

## 2017-03-01 VITALS — BP 100/60 | HR 76 | Ht 72.0 in | Wt 243.3 lb

## 2017-03-01 DIAGNOSIS — I272 Pulmonary hypertension, unspecified: Secondary | ICD-10-CM

## 2017-03-01 DIAGNOSIS — I4892 Unspecified atrial flutter: Secondary | ICD-10-CM | POA: Diagnosis not present

## 2017-03-01 DIAGNOSIS — Z951 Presence of aortocoronary bypass graft: Secondary | ICD-10-CM

## 2017-03-01 DIAGNOSIS — I4891 Unspecified atrial fibrillation: Secondary | ICD-10-CM

## 2017-03-01 DIAGNOSIS — E1122 Type 2 diabetes mellitus with diabetic chronic kidney disease: Secondary | ICD-10-CM

## 2017-03-01 DIAGNOSIS — I10 Essential (primary) hypertension: Secondary | ICD-10-CM | POA: Diagnosis not present

## 2017-03-01 DIAGNOSIS — N184 Chronic kidney disease, stage 4 (severe): Secondary | ICD-10-CM | POA: Diagnosis not present

## 2017-03-01 DIAGNOSIS — I5042 Chronic combined systolic (congestive) and diastolic (congestive) heart failure: Secondary | ICD-10-CM | POA: Diagnosis not present

## 2017-03-01 DIAGNOSIS — I2510 Atherosclerotic heart disease of native coronary artery without angina pectoris: Secondary | ICD-10-CM | POA: Diagnosis not present

## 2017-03-01 DIAGNOSIS — E1121 Type 2 diabetes mellitus with diabetic nephropathy: Secondary | ICD-10-CM

## 2017-03-01 NOTE — Telephone Encounter (Signed)
Spoke with Marco [name] he said they faxed over orders for below information-aa

## 2017-03-01 NOTE — Telephone Encounter (Signed)
[place] for verbal order for PT 2 times /week for 2 weeks then once weekly

## 2017-03-01 NOTE — Patient Instructions (Addendum)
Medication Instructions:   Stop the lasix Stay on torsemide one a day  For fluid retention, leg swelling, weight gain >248, take 2 torsemide  If you have dizziness with standing Drink some fluids If dizziness persists when standing Hold torsemide for a day  Stop eliquis 2 days before the skin procedure Restart 1 day later after procedure  Labwork:  No new labs needed  Testing/Procedures:  No further testing at this time   Follow-Up: It was a pleasure seeing you in the office today. Please call us if you have new issues that need to be addressed before your next appt.  [phone]  Your physician wants you to follow-up in: 3 months.  You will receive a reminder letter in the mail two months in advance. If you don't receive a letter, please call our office to schedule the follow-up appointment.  If you need a refill on your cardiac medications before your next appointment, please call your pharmacy.

## 2017-03-01 NOTE — Telephone Encounter (Signed)
Lemuel advised-aa 

## 2017-03-01 NOTE — Telephone Encounter (Signed)
Skeet Simmer from [facility] home [facility]. He is requesting verbal orders for PT for 2 times for 2 week then once weekly for 2 weeks. This is Marco [name] pt. And he is wanting approval before he gets back    [place] CB# [phone]

## 2017-03-02 DIAGNOSIS — R531 Weakness: Secondary | ICD-10-CM | POA: Diagnosis not present

## 2017-03-02 DIAGNOSIS — L89302 Pressure ulcer of unspecified buttock, stage 2: Secondary | ICD-10-CM | POA: Diagnosis not present

## 2017-03-02 DIAGNOSIS — R262 Difficulty in walking, not elsewhere classified: Secondary | ICD-10-CM | POA: Diagnosis not present

## 2017-03-02 DIAGNOSIS — M6281 Muscle weakness (generalized): Secondary | ICD-10-CM | POA: Diagnosis not present

## 2017-03-02 DIAGNOSIS — E11621 Type 2 diabetes mellitus with foot ulcer: Secondary | ICD-10-CM | POA: Diagnosis not present

## 2017-03-03 DIAGNOSIS — R531 Weakness: Secondary | ICD-10-CM | POA: Diagnosis not present

## 2017-03-03 DIAGNOSIS — R262 Difficulty in walking, not elsewhere classified: Secondary | ICD-10-CM | POA: Diagnosis not present

## 2017-03-03 DIAGNOSIS — M6281 Muscle weakness (generalized): Secondary | ICD-10-CM | POA: Diagnosis not present

## 2017-03-03 DIAGNOSIS — L89302 Pressure ulcer of unspecified buttock, stage 2: Secondary | ICD-10-CM | POA: Diagnosis not present

## 2017-03-03 DIAGNOSIS — E11621 Type 2 diabetes mellitus with foot ulcer: Secondary | ICD-10-CM | POA: Diagnosis not present

## 2017-03-05 ENCOUNTER — Encounter: Payer: Self-pay | Admitting: *Deleted

## 2017-03-05 ENCOUNTER — Other Ambulatory Visit: Payer: Self-pay | Admitting: *Deleted

## 2017-03-05 DIAGNOSIS — M6281 Muscle weakness (generalized): Secondary | ICD-10-CM | POA: Diagnosis not present

## 2017-03-05 DIAGNOSIS — E11621 Type 2 diabetes mellitus with foot ulcer: Secondary | ICD-10-CM | POA: Diagnosis not present

## 2017-03-05 DIAGNOSIS — R262 Difficulty in walking, not elsewhere classified: Secondary | ICD-10-CM | POA: Diagnosis not present

## 2017-03-05 DIAGNOSIS — R531 Weakness: Secondary | ICD-10-CM | POA: Diagnosis not present

## 2017-03-05 DIAGNOSIS — L89302 Pressure ulcer of unspecified buttock, stage 2: Secondary | ICD-10-CM | POA: Diagnosis not present

## 2017-03-05 NOTE — Patient Outreach (Signed)
[facility]) Care Management  03/05/2017  Marco [name] [place] Sr. 11-28-1935 [id number]   Transition of care call  Spoke with patient reports he still gets winded when walking in home.  Patient discussed he does not have increase in swelling, continues to wear oxygen, weigh 3 days a week . Today's weight is 243 and Friday weight was 242.  Patient states his blood pressure is 160/90 today he has taken all of his scheduled medication and plans to recheck it again in another and if it is elevated he plans to notify Dr.Gollan. Patient denies dizziness or any new symptoms    Patient reports he was able to participate in home health therapy on today. Patient discussed appointment with PCP in am.   Plan Will continue weekly telephone outreach as part of transition of care, discussed [name], RN will call in next week. Wife agreeable to home visit in the 2 weeks will schedule.  Patient will notify MD of weights gain of 3 pounds in a day and 5 in a week, increased shortness of breath/swelling follow PCP torsemide plan for weight gains.  [name], RN, [facility] [phone]- Mobile [phone]- Toll Free Main Office

## 2017-03-06 ENCOUNTER — Ambulatory Visit (INDEPENDENT_AMBULATORY_CARE_PROVIDER_SITE_OTHER): Payer: Medicare Other | Admitting: Family Medicine

## 2017-03-06 VITALS — BP 88/48 | HR 80 | Temp 98.7°F | Resp 18 | Wt 244.0 lb

## 2017-03-06 DIAGNOSIS — R531 Weakness: Secondary | ICD-10-CM

## 2017-03-06 DIAGNOSIS — Z23 Encounter for immunization: Secondary | ICD-10-CM | POA: Diagnosis not present

## 2017-03-06 DIAGNOSIS — L97524 Non-pressure chronic ulcer of other part of left foot with necrosis of bone: Secondary | ICD-10-CM | POA: Diagnosis not present

## 2017-03-06 DIAGNOSIS — Z09 Encounter for follow-up examination after completed treatment for conditions other than malignant neoplasm: Secondary | ICD-10-CM | POA: Diagnosis not present

## 2017-03-06 DIAGNOSIS — I951 Orthostatic hypotension: Secondary | ICD-10-CM

## 2017-03-06 DIAGNOSIS — Z794 Long term (current) use of insulin: Secondary | ICD-10-CM | POA: Diagnosis not present

## 2017-03-06 DIAGNOSIS — E11621 Type 2 diabetes mellitus with foot ulcer: Secondary | ICD-10-CM | POA: Diagnosis not present

## 2017-03-06 DIAGNOSIS — E1121 Type 2 diabetes mellitus with diabetic nephropathy: Secondary | ICD-10-CM | POA: Diagnosis not present

## 2017-03-06 DIAGNOSIS — G3184 Mild cognitive impairment, so stated: Secondary | ICD-10-CM

## 2017-03-06 DIAGNOSIS — S61412A Laceration without foreign body of left hand, initial encounter: Secondary | ICD-10-CM

## 2017-03-06 DIAGNOSIS — I255 Ischemic cardiomyopathy: Secondary | ICD-10-CM

## 2017-03-06 DIAGNOSIS — N184 Chronic kidney disease, stage 4 (severe): Secondary | ICD-10-CM | POA: Diagnosis not present

## 2017-03-06 LAB — GLUCOSE, POCT (MANUAL RESULT ENTRY): POC Glucose: 99 mg/dl (ref 70–99)

## 2017-03-06 NOTE — Progress Notes (Signed)
Marco [name].  MRN: [id number] DOB: 04-29-1936  Subjective:  HPI   Patient is here for hospital follow up and follow up after leaving [facility]. Hospital stay dates were 02/01/17-02/07/17. DX: Near syncope, pressure injury of skin, UTI, CKD Acute urinary retention: advised to follow up with Dr Louis Meckel urology as outpatient. Weakness: patient was transferred to rehab facility from the hospital. Patient left [facility] on 02/23/17 and is at home now. He has Physical therapy and nursing with Alvis Lemmings comes to his home at once a week now for nursing and PT 2 times a week. Patient states weakness overall is better, he does get short of breath. In the room today while checking patient in without oxygen his O2 is 89% at rest sitting. Patient is placed on oxygen at 2 liters right now and his O2 is now 98%. Patient uses Oxygen at home at 2 liters and at night. Patient also fell on 02/23/17 and skinned off his left hand. Patient had follow up with Dr Rockey Situ on 03/01/17. Lasix was stopped and patient was placed on Torsemide. Patient states he has a skin cancer on left breast and has to have that removed. Lab Results  Component Value Date   HGBA1C 6.1 (H) 02/02/2017   Medications were reconciled against the discharge medications and updated.  Wt Readings from Last 3 Encounters:  03/06/17 244 lb (110.7 kg)  03/01/17 243 lb 5 oz (110.4 kg)  02/21/17 249 lb 7.2 oz (113.1 kg)   BP Readings from Last 3 Encounters:  03/06/17 (!) 88/48  03/01/17 100/60  02/21/17 100/61   Patient Active Problem List   Diagnosis Date Noted  . Near syncope 02/02/2017  . Pressure injury of skin 02/02/2017  . Orthostasis 12/04/2016  . LGI bleed 06/10/2016  . Acute blood loss anemia 06/10/2016  . Chronic respiratory failure with hypoxia, on home O2 therapy ([place]) 06/10/2016  . PAD (peripheral artery disease) ([place]) 06/05/2016  . Chest pain 10/28/2015  . Gastroenteritis   . Diarrhea of infectious origin    . Emesis   . S/P CABG (coronary artery bypass graft)   . Coronary artery disease involving native coronary artery of native heart without angina pectoris   . PAF (paroxysmal atrial fibrillation) ([place])   . Absolute anemia 01/28/2015  . Atherosclerosis of coronary artery 01/28/2015  . Barrett's esophagus 01/28/2015  . Basal cell carcinoma of face 01/28/2015  . Benign fibroma of prostate 01/28/2015  . CCF (congestive cardiac failure) ([place]) 01/28/2015  . Diabetes ([place]) 01/28/2015  . Gastro-esophageal reflux disease without esophagitis 01/28/2015  . Irritable colon 01/28/2015  . Cannot sleep 01/28/2015  . Pleural cavity effusion 01/28/2015  . Apnea, sleep 01/28/2015  . Hematuria 12/21/2014  . Left hand weakness 09/23/2013  . Essential hypertension 06/23/2013  . Chronic combined systolic and diastolic CHF (congestive heart failure) ([place]) 06/10/2013  . Hypotension, postural 06/10/2013  . Hyperkalemia 06/10/2013  . Adjustment of cardiac pacemaker 06/10/2013  . Atrial fibrillation and flutter ([place]) 10/22/2012  . Pacemaker-Medtronic 06/19/2012  . Pulmonary hypertension ([place]) 03/18/2012  . Occlusion and stenosis of carotid artery without mention of cerebral infarction 02/08/2012  . Morbid obesity ([place]) 08/05/2010  . Sinoatrial node dysfunction (HCC) 06/02/2010  . Stage 4 chronic renal impairment associated with type 2 diabetes mellitus ([place]) 08/09/2009  . AAA 04/21/2009  . Ischemic cardiomyopathy  s/p CABG 11/23/2008  . MITRAL REGURGITATION 11/22/2008  . CAD (coronary artery disease) 11/22/2008  . Hyperlipidemia 03/02/2008  . ALLERGIC RHINITIS 03/02/2008  .  OSA (obstructive sleep apnea) 03/02/2008    Past Medical History:  Diagnosis Date  . AAA (abdominal aortic aneurysm)repaired    a. 12/2006 s/p repair of 6cm infrarenal AAA with 55mm Dacron graft.  . Anemia   . Anginal pain ([place])   . Atrial fibrillation/Flutter    a. Dx 2014;  b. 10/2012 s/p TEE/DCCV;  c. On amio and Eliquis  (CHA2DS2VASc = 6).  . Basal cell carcinoma    a. 2013 x 2 back and left neck  . BPH (benign prostatic hypertrophy)   . Carotid artery occlusion    a. 11/2008 s/p R CEA; b. 01/2017 Carotid U/S: RICA <27%, LICA >74%.  . Chronic systolic heart failure ([place])    a. 02/2012 Echo: EF 30-35%, Pulm HTN, and modest RV dysfunction;  b. 10/2012 TEE: EF 25-30%, mild conc LVH, diff HK, mod RV dysfxn, mod dil RA/LA, mild MR, no MS, nl MVR, mod TR, Ao sclerosis; c. 01/2017 Echo: EF 45-50%, diff HK, triv MR, mod dil LA.  . CKD (chronic kidney disease), stage IV ([place])   . Coronary artery disease    a. 1999 PCI RCA;  b. 01/2005 NSTEMI: CABG x 3 (LIMA->LAD, VG->OM, VG->LPL) @ time of MVR.  [name] DVT (deep venous thrombosis) ([place])   . GERD (gastroesophageal reflux disease)   . History of hiatal hernia   . History of shingles   . Hyperlipidemia   . Hypertensive heart disease   . Mitral regurgitation--s/p repair    a. 01/2005 MVR: 28-mm Edwards ET Logix ring annuloplasty; b. 10/2012 TEE: normal fxning MV ring with mild MR, no MS; 01/2017 Echo: Triv MR.  . Mixed Ischemic and Non-ischemic Cardiomyopathy    a. 02/2012 Echo: EF 30-35%, Pulm HTN, and modest RV dysfunction;  b. 10/2012 TEE: EF 25-30%, mild conc LVH, diff HK, mod RV dysfxn, mod dil RA/LA, mild MR, no MS, nl MVR, mod TR, Ao sclerosis; c. 01/2017 Echo: EF 45-50%.  . Myocardial infarction ([place])    2006  . Obesity   . Orthopnea   . Orthostatic hypotension    a. 11/2016 -> meds reduced;  b. 01/2017 admitted w/ presyncope and orthostasis->Meds held.  . Oxygen deficit    a. Wears 2 lpm most of the day and @ HS.  . Presence of permanent cardiac pacemaker   . PVC (premature ventricular contraction)    a. 02/2009 s/p RFCA ([place]).  . Sick sinus syndrome ([place])    a. 01/2005 s/p PPM 2/2 heart block following CABG;  b. 05/2012 Gen Change: MDT Adapta ADDRL1 DC PPM, ser # [id number] H.  . Sleep apnea    a. Intolerant of CPAP  . Type II diabetes mellitus ([place])     Social  History   Social History  . Marital status: Married    Spouse name: N/A  . Number of children: N/A  . Years of education: N/A   Occupational History  . Retired     Engineer, structural in [place]  .  Retired   Social History Main Topics  . Smoking status: Former Smoker    Packs/day: 1.50    Years: 34.00    Types: Cigarettes    Quit date: 08/28/1987  . Smokeless tobacco: Never Used  . Alcohol use 0.6 oz/week    1 Cans of beer per week     Comment: Occasionally but none since his surgery in June  . Drug use: No  . Sexual activity: No   Other Topics Concern  . Not  on file   Social History Narrative   Lives locally with wife.  Sedentary - sits most of the day.  Ambulates some around his home - uses a walker.    Outpatient Encounter Prescriptions as of 03/06/2017  Medication Sig  . ACCU-CHEK FASTCLIX LANCETS MISC TEST twice a day if needed  . ACCU-CHEK SMARTVIEW test strip   . ACCU-CHEK SMARTVIEW test strip TEST twice a day  . albuterol (PROVENTIL) (2.5 MG/3ML) 0.083% nebulizer solution Take 3 mLs (2.5 mg total) by nebulization every 6 (six) hours as needed for wheezing or shortness of breath.  [name] amiodarone (PACERONE) 200 MG tablet take 1 tablet by mouth once daily  . amoxicillin-clavulanate (AUGMENTIN) 875-125 MG tablet Take 1 tablet by mouth 2 (two) times daily.   . BD INSULIN SYRINGE ULTRAFINE 31G X 5/16" 0.3 ML MISC   . BD INSULIN SYRINGE ULTRAFINE 31G X 5/16" 0.3 ML MISC use twice a day as directed  . calcitRIOL (ROCALTROL) 0.5 MCG capsule take 1 capsule by mouth once daily  . cholecalciferol (VITAMIN D) 1000 UNITS tablet Take 1,000 Units by mouth daily.  [name] docusate sodium (COLACE) 100 MG capsule Take 100 mg by mouth 2 (two) times daily.    [name] ELIQUIS 5 MG TABS tablet take 1 tablet by mouth twice a day  . finasteride (PROSCAR) 5 MG tablet Take 5 mg by mouth daily.  . insulin lispro protamine-lispro (HUMALOG MIX 75/25) (75-25) 100 UNIT/ML SUSP injection Inject 5 Units into  the skin 2 (two) times daily with a meal.  . losartan (COZAAR) 25 MG tablet 0.5 tablets daily orally  . mometasone (ELOCON) 0.1 % ointment Apply 1 application topically 2 (two) times daily.  . Multiple Vitamins-Minerals (MULTI-VITAMIN GUMMIES) CHEW Chew by mouth daily.  [name] nystatin (MYCOSTATIN/NYSTOP) powder Apply topically 2 (two) times daily.  [name] omeprazole (PRILOSEC) 20 MG capsule Take 20 mg by mouth daily. Monday, Wednesday, friday  . OXYGEN Inhale 1.5 L into the lungs.  . polyethylene glycol (MIRALAX / GLYCOLAX) packet Take 17 g by mouth daily as needed for mild constipation.  . rosuvastatin (CRESTOR) 20 MG tablet take 1 tablet by mouth at bedtime  . torsemide (DEMADEX) 20 MG tablet Take 1-2 tablets (20-40 mg total) by mouth as directed. 20 mg once daily when weight is less than 248. Take 40 mg (2 tablets) once daily when weight is 248 or higher.  . triamcinolone (NASACORT ALLERGY 24HR) 55 MCG/ACT AERO nasal inhaler Place 2 sprays into the nose daily.   No facility-administered encounter medications on file as of 03/06/2017.     Allergies  Allergen Reactions  . Shellfish Allergy Anaphylaxis    Review of Systems  Constitutional: Positive for malaise/fatigue.  Respiratory: Positive for cough and shortness of breath. Negative for wheezing.   Cardiovascular: Positive for leg swelling (off and on). Negative for chest pain and palpitations.  Gastrointestinal: Negative.   Genitourinary: Negative for dysuria.       Urinary hesitency  Musculoskeletal: Positive for falls.  Skin:       Skin cancer on left breast. Also abrasion present on left hand.  Neurological: Positive for dizziness (sometimes), tingling (feet) and weakness (better).    Objective:  BP (!) 88/48   Pulse 80   Temp 98.7 F (37.1 C)   Resp 18   Wt 244 lb (110.7 kg)   SpO2 (!) 89% Comment: off oxygen on oxygen at 2 liters it is 98%  BMI 33.09 kg/m   Physical Exam  Constitutional:  He is oriented to person, place, and  time and well-developed, well-nourished, and in no distress.  HENT:  Head: Normocephalic and atraumatic.  Eyes: Conjunctivae are normal. Pupils are equal, round, and reactive to light.  Neck: Normal range of motion. Neck supple.  Cardiovascular: Normal rate, regular rhythm, normal heart sounds and intact distal pulses.  Exam reveals no gallop.   No murmur heard. Pulmonary/Chest: Effort normal and breath sounds normal. No respiratory distress. He has no wheezes.  Abdominal: Soft. There is no tenderness.  Musculoskeletal: He exhibits no edema.  Necrosis of the left second toe.  Neurological: He is alert and oriented to person, place, and time.  Using a wheelchair for ambulation.  Skin:   superficial tears that are healing nicely of the left lateral hand.   Assessment and Plan :  1. Hospital discharge follow-up Records reviewed, medications reconcilled againstthe discharge medications and current medication.  2. Hypotension, postural Advised patient to hold Torsemide tomorrow since patient took it today already. Follow. Re check in 6 to 8 weeks. - Renal Function Panel  3. Chronic renal failure, stage 4 (severe) (HCC) - Renal Function Panel  4. Weakness Hypotensive today.  - POCT Glucose (CBG)-99. - Renal Function Panel  5. Type 2 diabetes mellitus with diabetic nephropathy, with long-term current use of insulin (HCC) - POCT Glucose (CBG)  6. Ischemic cardiomyopathy 7. Skin ulcer of second toe of left foot with necrosis of bone (HCC) Changed dressing on the toe, applied antibiotic ointment to the area. Patient is following up with Dr Cleda Mccreedy. Palliative care may be appropriate in near future. 8. Skin tear of left hand without complication, initial encounter Re dressed today, applied antibiotic ointment to the area. Healing well. Not infected at this time. Follow as needed.  9. Need for Td vaccine Administered Td today. No record of tetanus immunization in the chart and patient  has abrasions and cut through the skin. 10. MCI MMSE today 24/30. Consider Donepezil in the future. Follow.  HPI, Exam and A&P transcribed by Theressa Millard, RMA under direction and in the presence of Miguel Aschoff, MD. I have done the exam and reviewed the chart and it is accurate to the best of my knowledge. Development worker, community has been used and  any errors in dictation or transcription are unintentional. Miguel Aschoff M.D. [facility]

## 2017-03-07 LAB — RENAL FUNCTION PANEL
Albumin: 3.6 g/dL (ref 3.5–4.8)
BUN / CREAT RATIO: 13 (ref 10–24)
BUN: 34 mg/dL — AB (ref 8–27)
CALCIUM: 9.9 mg/dL (ref 8.6–10.2)
CO2: 34 mmol/L — ABNORMAL HIGH (ref 20–29)
CREATININE: 2.62 mg/dL — AB (ref 0.76–1.27)
Chloride: 97 mmol/L (ref 96–106)
GFR calc non Af Amer: 22 mL/min/{1.73_m2} — ABNORMAL LOW (ref >59)
GFR, EST AFRICAN AMERICAN: 26 mL/min/{1.73_m2} — AB (ref >59)
Glucose: 105 mg/dL — ABNORMAL HIGH (ref 65–99)
Phosphorus: 2.9 mg/dL (ref 2.5–4.5)
Potassium: 4.5 mmol/L (ref 3.5–5.2)
SODIUM: 144 mmol/L (ref 134–144)

## 2017-03-08 ENCOUNTER — Encounter: Payer: Self-pay | Admitting: *Deleted

## 2017-03-08 ENCOUNTER — Emergency Department: Payer: Medicare Other

## 2017-03-08 ENCOUNTER — Emergency Department
Admission: EM | Admit: 2017-03-08 | Discharge: 2017-03-08 | Disposition: A | Discharge status: Home / Self Care | Payer: Medicare Other | Attending: Emergency Medicine | Admitting: Emergency Medicine

## 2017-03-08 ENCOUNTER — Telehealth: Payer: Self-pay | Admitting: Family Medicine

## 2017-03-08 DIAGNOSIS — I252 Old myocardial infarction: Secondary | ICD-10-CM | POA: Diagnosis not present

## 2017-03-08 DIAGNOSIS — Z950 Presence of cardiac pacemaker: Secondary | ICD-10-CM | POA: Insufficient documentation

## 2017-03-08 DIAGNOSIS — Z87891 Personal history of nicotine dependence: Secondary | ICD-10-CM | POA: Diagnosis not present

## 2017-03-08 DIAGNOSIS — Z794 Long term (current) use of insulin: Secondary | ICD-10-CM | POA: Insufficient documentation

## 2017-03-08 DIAGNOSIS — Z951 Presence of aortocoronary bypass graft: Secondary | ICD-10-CM | POA: Insufficient documentation

## 2017-03-08 DIAGNOSIS — I110 Hypertensive heart disease with heart failure: Secondary | ICD-10-CM | POA: Insufficient documentation

## 2017-03-08 DIAGNOSIS — I959 Hypotension, unspecified: Secondary | ICD-10-CM | POA: Diagnosis not present

## 2017-03-08 DIAGNOSIS — I2510 Atherosclerotic heart disease of native coronary artery without angina pectoris: Secondary | ICD-10-CM | POA: Insufficient documentation

## 2017-03-08 DIAGNOSIS — Z7401 Bed confinement status: Secondary | ICD-10-CM | POA: Diagnosis not present

## 2017-03-08 DIAGNOSIS — R42 Dizziness and giddiness: Secondary | ICD-10-CM | POA: Diagnosis not present

## 2017-03-08 DIAGNOSIS — E1122 Type 2 diabetes mellitus with diabetic chronic kidney disease: Secondary | ICD-10-CM | POA: Insufficient documentation

## 2017-03-08 DIAGNOSIS — I5042 Chronic combined systolic (congestive) and diastolic (congestive) heart failure: Secondary | ICD-10-CM | POA: Diagnosis not present

## 2017-03-08 DIAGNOSIS — N184 Chronic kidney disease, stage 4 (severe): Secondary | ICD-10-CM | POA: Insufficient documentation

## 2017-03-08 DIAGNOSIS — R531 Weakness: Secondary | ICD-10-CM

## 2017-03-08 DIAGNOSIS — Z7901 Long term (current) use of anticoagulants: Secondary | ICD-10-CM | POA: Diagnosis not present

## 2017-03-08 DIAGNOSIS — E860 Dehydration: Secondary | ICD-10-CM | POA: Diagnosis not present

## 2017-03-08 DIAGNOSIS — S0990XA Unspecified injury of head, initial encounter: Secondary | ICD-10-CM | POA: Diagnosis not present

## 2017-03-08 DIAGNOSIS — I129 Hypertensive chronic kidney disease with stage 1 through stage 4 chronic kidney disease, or unspecified chronic kidney disease: Secondary | ICD-10-CM | POA: Diagnosis not present

## 2017-03-08 LAB — URINALYSIS, COMPLETE (UACMP) WITH MICROSCOPIC
BACTERIA UA: NONE SEEN
BILIRUBIN URINE: NEGATIVE
Glucose, UA: 50 mg/dL — AB
Ketones, ur: NEGATIVE mg/dL
LEUKOCYTES UA: NEGATIVE
NITRITE: NEGATIVE
Protein, ur: 30 mg/dL — AB
SPECIFIC GRAVITY, URINE: 1.009 (ref 1.005–1.030)
Squamous Epithelial / LPF: NONE SEEN
pH: 6 (ref 5.0–8.0)

## 2017-03-08 LAB — BASIC METABOLIC PANEL
Anion gap: 9 (ref 5–15)
BUN: 39 mg/dL — AB (ref 6–20)
CALCIUM: 9.4 mg/dL (ref 8.9–10.3)
CO2: 32 mmol/L (ref 22–32)
CREATININE: 2.84 mg/dL — AB (ref 0.61–1.24)
Chloride: 100 mmol/L — ABNORMAL LOW (ref 101–111)
GFR calc Af Amer: 23 mL/min — ABNORMAL LOW (ref >60)
GFR, EST NON AFRICAN AMERICAN: 20 mL/min — AB (ref >60)
Glucose, Bld: 159 mg/dL — ABNORMAL HIGH (ref 65–99)
Potassium: 4 mmol/L (ref 3.5–5.1)
SODIUM: 141 mmol/L (ref 135–145)

## 2017-03-08 LAB — CBC
HCT: 35 % — ABNORMAL LOW (ref 40.0–52.0)
Hemoglobin: 11.7 g/dL — ABNORMAL LOW (ref 13.0–18.0)
MCH: 31.2 pg (ref 26.0–34.0)
MCHC: 33.4 g/dL (ref 32.0–36.0)
MCV: 93.4 fL (ref 80.0–100.0)
PLATELETS: 175 10*3/uL (ref 150–440)
RBC: 3.75 MIL/uL — ABNORMAL LOW (ref 4.40–5.90)
RDW: 15.5 % — AB (ref 11.5–14.5)
WBC: 6.7 10*3/uL (ref 3.8–10.6)

## 2017-03-08 LAB — HEPATIC FUNCTION PANEL
ALT: 17 U/L (ref 17–63)
AST: 27 U/L (ref 15–41)
Albumin: 2.9 g/dL — ABNORMAL LOW (ref 3.5–5.0)
Alkaline Phosphatase: 48 U/L (ref 38–126)
BILIRUBIN TOTAL: 0.6 mg/dL (ref 0.3–1.2)
Total Protein: 6.6 g/dL (ref 6.5–8.1)

## 2017-03-08 LAB — LIPASE, BLOOD: LIPASE: 23 U/L (ref 11–51)

## 2017-03-08 LAB — TROPONIN I: Troponin I: <0.03 ng/mL (ref <0.03)

## 2017-03-08 MED ORDER — SODIUM CHLORIDE 0.9 % IV BOLUS (SEPSIS)
1000.0000 mL | Freq: Once | INTRAVENOUS | Status: AC
  Administered 2017-03-08: 1000 mL via INTRAVENOUS

## 2017-03-08 NOTE — ED Notes (Signed)
Called EMS for transport  1500

## 2017-03-08 NOTE — ED Notes (Signed)
EDP at bedside  

## 2017-03-08 NOTE — ED Provider Notes (Signed)
[facility] Emergency Department Provider Note  Time seen: 10:30 AM  I have reviewed the triage vital signs and the nursing notes.   HISTORY  Chief Complaint Weakness    HPI Marco Tingler Sr. is a 81 y.o. male With multiple medical issues including AAA status post or pallor, anemia, atrial fibrillation on blood thinner, CHF, CK D, history of DVT, hypertension, hyperlipidemia, sick sinus syndrome status post CABG, status post pacemaker, who presents to the emergency department for weakness and hypotension. According to the patient he was feeling well yesterday however this morning he felt very weak. He went to use the restroom and was not able to bend down the left his pants up. He called for his wife. CT was feeling extremely weak he tried to walk back to his room but was too weak and slumped down to the ground. They called EMS at that time. Patient states his blood pressure was in the 80s per EMS. Denies any chest pain, shortness breath, nausea, vomiting, diarrhea, dysuria. Denies any chest or abdominal pain  Past Medical History:  Diagnosis Date  . AAA (abdominal aortic aneurysm)repaired    a. 12/2006 s/p repair of 6cm infrarenal AAA with 80mm Dacron graft.  . Anemia   . Anginal pain ([place])   . Atrial fibrillation/Flutter    a. Dx 2014;  b. 10/2012 s/p TEE/DCCV;  c. On amio and Eliquis (CHA2DS2VASc = 6).  . Basal cell carcinoma    a. 2013 x 2 back and left neck  . BPH (benign prostatic hypertrophy)   . Carotid artery occlusion    a. 11/2008 s/p R CEA; b. 01/2017 Carotid U/S: RICA <40%, LICA >98%.  . Chronic systolic heart failure ([place])    a. 02/2012 Echo: EF 30-35%, Pulm HTN, and modest RV dysfunction;  b. 10/2012 TEE: EF 25-30%, mild conc LVH, diff HK, mod RV dysfxn, mod dil RA/LA, mild MR, no MS, nl MVR, mod TR, Ao sclerosis; c. 01/2017 Echo: EF 45-50%, diff HK, triv MR, mod dil LA.  . CKD (chronic kidney disease), stage IV ([place])   . Coronary artery disease     a. 1999 PCI RCA;  b. 01/2005 NSTEMI: CABG x 3 (LIMA->LAD, VG->OM, VG->LPL) @ time of MVR.  [name] DVT (deep venous thrombosis) ([place])   . GERD (gastroesophageal reflux disease)   . History of hiatal hernia   . History of shingles   . Hyperlipidemia   . Hypertensive heart disease   . Mitral regurgitation--s/p repair    a. 01/2005 MVR: 28-mm Edwards ET Logix ring annuloplasty; b. 10/2012 TEE: normal fxning MV ring with mild MR, no MS; 01/2017 Echo: Triv MR.  . Mixed Ischemic and Non-ischemic Cardiomyopathy    a. 02/2012 Echo: EF 30-35%, Pulm HTN, and modest RV dysfunction;  b. 10/2012 TEE: EF 25-30%, mild conc LVH, diff HK, mod RV dysfxn, mod dil RA/LA, mild MR, no MS, nl MVR, mod TR, Ao sclerosis; c. 01/2017 Echo: EF 45-50%.  . Myocardial infarction ([place])    2006  . Obesity   . Orthopnea   . Orthostatic hypotension    a. 11/2016 -> meds reduced;  b. 01/2017 admitted w/ presyncope and orthostasis->Meds held.  . Oxygen deficit    a. Wears 2 lpm most of the day and @ HS.  . Presence of permanent cardiac pacemaker   . PVC (premature ventricular contraction)    a. 02/2009 s/p RFCA ([place]).  . Sick sinus syndrome ([place])    a. 01/2005  s/p PPM 2/2 heart block following CABG;  b. 05/2012 Gen Change: MDT Adapta ADDRL1 DC PPM, ser # [id number].  . Sleep apnea    a. Intolerant of CPAP  . Type II diabetes mellitus [facility])     Patient Active Problem List   Diagnosis Date Noted  . Near syncope 02/02/2017  . Pressure injury of skin 02/02/2017  . Orthostasis 12/04/2016  . LGI bleed 06/10/2016  . Acute blood loss anemia 06/10/2016  . Chronic respiratory failure with hypoxia, on home O2 therapy ([place]) 06/10/2016  . PAD (peripheral artery disease) ([place]) 06/05/2016  . Chest pain 10/28/2015  . Gastroenteritis   . Diarrhea of infectious origin   . Emesis   . S/P CABG (coronary artery bypass graft)   . Coronary artery disease involving native coronary artery of native heart without angina pectoris   . PAF  (paroxysmal atrial fibrillation) ([place])   . Absolute anemia 01/28/2015  . Atherosclerosis of coronary artery 01/28/2015  . Barrett's esophagus 01/28/2015  . Basal cell carcinoma of face 01/28/2015  . Benign fibroma of prostate 01/28/2015  . CCF (congestive cardiac failure) ([place]) 01/28/2015  . Diabetes ([place]) 01/28/2015  . Gastro-esophageal reflux disease without esophagitis 01/28/2015  . Irritable colon 01/28/2015  . Cannot sleep 01/28/2015  . Pleural cavity effusion 01/28/2015  . Apnea, sleep 01/28/2015  . Hematuria 12/21/2014  . Left hand weakness 09/23/2013  . Essential hypertension 06/23/2013  . Chronic combined systolic and diastolic CHF (congestive heart failure) ([place]) 06/10/2013  . Hypotension, postural 06/10/2013  . Hyperkalemia 06/10/2013  . Adjustment of cardiac pacemaker 06/10/2013  . Atrial fibrillation and flutter ([place]) 10/22/2012  . Pacemaker-Medtronic 06/19/2012  . Pulmonary hypertension ([place]) 03/18/2012  . Occlusion and stenosis of carotid artery without mention of cerebral infarction 02/08/2012  . Morbid obesity ([place]) 08/05/2010  . Sinoatrial node dysfunction ([facility]) 06/02/2010  . Stage 4 chronic renal impairment associated with type 2 diabetes mellitus ([place]) 08/09/2009  . AAA 04/21/2009  . Ischemic cardiomyopathy  s/p CABG 11/23/2008  . MITRAL REGURGITATION 11/22/2008  . CAD (coronary artery disease) 11/22/2008  . Hyperlipidemia 03/02/2008  . ALLERGIC RHINITIS 03/02/2008  . OSA (obstructive sleep apnea) 03/02/2008    Past Surgical History:  Procedure Laterality Date  . ABDOMINAL AORTIC ANEURYSM REPAIR      May 2008,   . CARDIAC CATHETERIZATION  2006   @ [facility]  . CATARACT EXTRACTION     right  . CATARACT EXTRACTION W/PHACO Left 10/04/2016   Procedure: CATARACT EXTRACTION PHACO AND INTRAOCULAR LENS PLACEMENT (IOC);  Surgeon: Estill Cotta, MD;  Location: [facility] ORS;  Service: Ophthalmology;  Laterality: Left;  [place] 01:21 AP% 25.9 CDE 39.08 Fluid pack lot #  [id number]   . CORONARY ARTERY BYPASS GRAFT     CABG with a  LIMA to the LAD, SVG to circumflex, SVG to posterolateral [id number].  Randolm Idol / REPLACE / REMOVE PACEMAKER  2006   Dr. [name]  . MITRAL VALVE REPAIR  2006    mitral valve repair with a #28 Edwards logic ring  . PACEMAKER PLACEMENT    . PERMANENT PACEMAKER GENERATOR CHANGE N/A 06/26/2012   Procedure: PERMANENT PACEMAKER GENERATOR CHANGE;  Surgeon: Deboraha Sprang, MD;  Location: [facility] CATH LAB;  Service: Cardiovascular;  Laterality: N/A;  . SKIN BIOPSY     left wrist; right elbow  . SKIN CANCER EXCISION      x 6  . SKIN CANCER EXCISION    . TRANSESOPHAGEAL ECHOCARDIOGRAM    . VASECTOMY  Prior to Admission medications   Medication Sig Start Date End Date Taking? Authorizing Provider  ACCU-CHEK FASTCLIX LANCETS MISC TEST twice a day if needed 07/04/16   [name], MD  ACCU-CHEK SMARTVIEW test strip  11/02/15   [provider]  ACCU-CHEK SMARTVIEW test strip TEST twice a day 07/05/16   [name], MD  albuterol (PROVENTIL) (2.5 MG/3ML) 0.083% nebulizer solution Take 3 mLs (2.5 mg total) by nebulization every 6 (six) hours as needed for wheezing or shortness of breath. 02/07/17   Loletha Grayer, MD  amiodarone (PACERONE) 200 MG tablet take 1 tablet by mouth once daily 01/08/17   Minna Merritts, MD  amoxicillin-clavulanate (AUGMENTIN) 875-125 MG tablet Take 1 tablet by mouth 2 (two) times daily.  02/22/17   [provider]  BD INSULIN SYRINGE ULTRAFINE 31G X 5/16" 0.3 ML MISC  02/12/16   [provider]  BD INSULIN SYRINGE ULTRAFINE 31G X 5/16" 0.3 ML MISC use twice a day as directed 04/03/16   [name], MD  calcitRIOL (ROCALTROL) 0.5 MCG capsule take 1 capsule by mouth once daily 07/04/16   [name], MD  cholecalciferol (VITAMIN D) 1000 UNITS tablet Take 1,000 Units by mouth daily.    [provider]  docusate sodium (COLACE) 100 MG capsule Take 100  mg by mouth 2 (two) times daily.      [provider]  ELIQUIS 5 MG TABS tablet take 1 tablet by mouth twice a day 07/31/16   [name], MD  finasteride (PROSCAR) 5 MG tablet Take 5 mg by mouth daily.    [provider]  insulin lispro protamine-lispro (HUMALOG MIX 75/25) (75-25) 100 UNIT/ML SUSP injection Inject 5 Units into the skin 2 (two) times daily with a meal. 02/07/17   Loletha Grayer, MD  losartan (COZAAR) 25 MG tablet 0.5 tablets daily orally 02/07/17   Wieting, Richard, MD  mometasone (ELOCON) 0.1 % ointment Apply 1 application topically 2 (two) times daily. 01/31/17   [provider]  Multiple Vitamins-Minerals (MULTI-VITAMIN GUMMIES) CHEW Chew by mouth daily.    [provider]  nystatin (MYCOSTATIN/NYSTOP) powder Apply topically 2 (two) times daily. 02/07/17   Loletha Grayer, MD  omeprazole (PRILOSEC) 20 MG capsule Take 20 mg by mouth daily. Monday, Wednesday, friday    [provider]  OXYGEN Inhale 1.5 L into the lungs.    [provider]  polyethylene glycol (MIRALAX / GLYCOLAX) packet Take 17 g by mouth daily as needed for mild constipation. 06/12/16   Fritzi Mandes, MD  rosuvastatin (CRESTOR) 20 MG tablet take 1 tablet by mouth at bedtime 10/02/16   Minna Merritts, MD  torsemide (DEMADEX) 20 MG tablet Take 1-2 tablets (20-40 mg total) by mouth as directed. 20 mg once daily when weight is less than 248. Take 40 mg (2 tablets) once daily when weight is 248 or higher. 02/23/17 05/24/17  Minna Merritts, MD  triamcinolone (NASACORT ALLERGY 24HR) 55 MCG/ACT AERO nasal inhaler Place 2 sprays into the nose daily.    [provider]    Allergies  Allergen Reactions  . Shellfish Allergy Anaphylaxis    Family History  Problem Relation Age of Onset  . Heart attack Mother 44  . Diabetes Sister   . Hypertension Sister   . Heart attack Sister   . Heart attack Brother 63       3 Brothers deceased from [place] in 64's  or  69's  . Heart disease Brother        Heart Disease before age 67  . Heart attack Father 96  . Diabetes Father        Amputation: bilateral feet  . Heart disease Father   . Heart attack Sister   . Heart attack Sister   . Heart attack Brother 32  . Heart attack Brother 44  . Diabetes Son     Social History Social History  Substance Use Topics  . Smoking status: Former Smoker    Packs/day: 1.50    Years: 34.00    Types: Cigarettes    Quit date: 08/28/1987  . Smokeless tobacco: Never Used  . Alcohol use 0.6 oz/week    1 Cans of beer per week     Comment: Occasionally but none since his surgery in June    Review of Systems Constitutional: Negative for fever Cardiovascular: Negative for chest pain. Respiratory: Negative for shortness of breath. Gastrointestinal: Negative for abdominal pain, vomiting and diarrhea. Genitourinary: Negative for dysuria. Musculoskeletal: Negative for back pain. Neurological: Negative for headachearea denies focal weakness or numbness. Positive for generalized fatigue/weakness All other ROS negative  ____________________________________________   PHYSICAL EXAM:  VITAL SIGNS: ED Triage Vitals  Enc Vitals Group     BP --      Pulse --      Resp --      Temp 03/08/17 0935 97.9 F (36.6 C)     Temp Source 03/08/17 0935 Oral     SpO2 --      Weight 03/08/17 0937 244 lb (110.7 kg)     Height 03/08/17 0937 6' (1.829 m)     Head Circumference --      Peak Flow --      Pain Score 03/08/17 0935 0     Pain Loc --      Pain Edu? --      Excl. in [place]? --     Constitutional: Alert and oriented. Well appearing and in no distress. Eyes: Normal exam ENT   Head: Normocephalic and atraumatic.   Mouth/Throat: Mucous membranes are moist. Cardiovascular: Normal rate, regular rhythm. Respiratory: Normal respiratory effort without tachypnea nor retractions. Breath sounds are clear and equal bilaterally. No  wheezes/rales/rhonchi. Gastrointestinal: Soft and nontender. No distention.  obese Musculoskeletal: Nontender with normal range of motion in all extremities.  Neurologic:  Normal speech and language. No gross focal neurologic deficits  Skin:  Skin is warm, dry and intact.  Psychiatric: Mood and affect are normal.   ____________________________________________    EKG  EKG reviewed and interpreted by myself shows a paced rhythm at 86 bpm with a widened QRS and nonspecific ST changes.  ____________________________________________    RADIOLOGY  CT head negative  ____________________________________________   INITIAL IMPRESSION / ASSESSMENT AND PLAN / ED COURSE  Pertinent labs & imaging results that were available during my care of the patient were reviewed by me and considered in my medical decision making (see chart for details).  patient presents to the emergency department for generalized fatigue/weakness. Patient states the same thing happen to him last month due to low blood pressure. Denies any infectious symptoms. States he slumped down to the ground denies hitting his head or any injuries from falling. Did not lose consciousness. On exam patient does appear somewhat fatigued and weak but is able to follow commands and moves all extremities well and no focal deficits identified. We will check labs, CT head as the patient is  on blood thinner, urinalysis, IV hydrate and continue to closely monitor in the emergency department while awaiting further history from his wife.  I discussed the patient with Dr.Arida, the patient has a long history of hypotension. I discussed the patient with his wife who states the patient has been feeling weak over the past weeks weeks or so has been at rehabilitation is now home has home health as well as home physical therapy. After discussed with cardiology he states there is very little that they can do as far as medication changes, etc. I discussed this  with the patient, he states he is feeling much better, his blood pressure responded very nicely currently 160/87. Denies any weakness at this time. He believes it is safe for him to be discharged home. I offered admission although likely observation admission. Patient states he would rather go home, wife is agreeable. We will discharge with EMS transport.  ____________________________________________   FINAL CLINICAL IMPRESSION(S) / ED DIAGNOSES  generalized weakness    Harvest Dark, MD 03/08/17 1454

## 2017-03-08 NOTE — ED Notes (Signed)
Pt resting in bed, denies any needs, watching tv

## 2017-03-08 NOTE — Telephone Encounter (Signed)
FYI below-aa 

## 2017-03-08 NOTE — ED Notes (Signed)
Pt assisted to use bathroom, helped back to bed, pt in no acute distress

## 2017-03-08 NOTE — ED Notes (Signed)
EMS at bedside for transport

## 2017-03-08 NOTE — ED Triage Notes (Addendum)
Pt arrives via EMS from home, pt was weak this AM and slipped and fell out of his recliner, upon EMS arrival BP was 80/50, 500 NS fluids given by EMS, pt denies any pain, pt on eliquis, denies hitting his head when he fell, awake and alert upon arrival, pale in color, states feeling general weakness, pt on 2L [place] chronic

## 2017-03-08 NOTE — ED Notes (Signed)
EMS called for transport, wife aware of pt being transported home

## 2017-03-08 NOTE — ED Notes (Signed)
Pt resting in bed, eyes closed, resp even and unlabored

## 2017-03-08 NOTE — ED Notes (Signed)
Patient transported to CT 

## 2017-03-08 NOTE — Telephone Encounter (Signed)
Lemuel with [facility] called to report a missed visit with pt.  Pt wife states pt was walking from the bathroom to the living room and started feeling dizzy.  Pt fell on the floor.  Pt wife called EMS and the EMS has taken pt to [facility] ER due to low blood pressure.  YX#[phone]/MQ

## 2017-03-09 ENCOUNTER — Telehealth: Payer: Self-pay | Admitting: Family Medicine

## 2017-03-09 DIAGNOSIS — E11621 Type 2 diabetes mellitus with foot ulcer: Secondary | ICD-10-CM | POA: Diagnosis not present

## 2017-03-09 DIAGNOSIS — R262 Difficulty in walking, not elsewhere classified: Secondary | ICD-10-CM | POA: Diagnosis not present

## 2017-03-09 DIAGNOSIS — L89302 Pressure ulcer of unspecified buttock, stage 2: Secondary | ICD-10-CM | POA: Diagnosis not present

## 2017-03-09 DIAGNOSIS — R531 Weakness: Secondary | ICD-10-CM | POA: Diagnosis not present

## 2017-03-09 DIAGNOSIS — M6281 Muscle weakness (generalized): Secondary | ICD-10-CM | POA: Diagnosis not present

## 2017-03-09 NOTE — Telephone Encounter (Signed)
fyi-aa 

## 2017-03-09 NOTE — Telephone Encounter (Signed)
Marco [name] with [facility] stated pt had a missed therapy visit today because pt had gone to the ER yesterday and didn't feel up to therapy today. Thanks TNP

## 2017-03-09 NOTE — Telephone Encounter (Signed)
Nehemiah Massed as below.

## 2017-03-09 NOTE — Telephone Encounter (Signed)
Denyse Amass with [facility] states pt a new skin tear on right forearm that happen when removing a dressing after a blood draw.  Denyse Amass is requesting a verbal order to cover the skin tear with a non stick dressing.  CB#[phone]/MW

## 2017-03-09 NOTE — Telephone Encounter (Signed)
OK 

## 2017-03-09 NOTE — Telephone Encounter (Signed)
Please review-aa 

## 2017-03-12 DIAGNOSIS — I509 Heart failure, unspecified: Secondary | ICD-10-CM | POA: Diagnosis not present

## 2017-03-14 ENCOUNTER — Other Ambulatory Visit: Payer: Self-pay

## 2017-03-14 NOTE — Patient Outreach (Signed)
Transition of care: Vitals:   03/14/17 1435  Weight: 245 lb (111.1 kg)   Placed call to patient who identified himself. Patient reports that he is doing okay. States that he feels weak all the time. Reports no chest pain.  States that his weight is about the same but reports difficulty with the scales that he has. Reports that his wife is able to weigh on the scale but that he struggles to weigh. Reports that he thinks it is because of the way he stands on the scale. Reviewed with patient to try different approaches to getting on scale. Reviewed with patient to ask home health nurse to assist him with trouble shooting the scale.  Patient agreed.    Patient reports that he is eating well.  Reports BP today while on the phone with me of  150/86 and 140/82.     Denies any other concerns.  PLAN: Will update assigned case Freight forwarder. Encouraged patient to attempt to stand on the scale better and to review concerns with home health nurse this week during visit.   [name], RN, BSN, CEN [facility] [employer] [phone]

## 2017-03-15 ENCOUNTER — Encounter: Payer: Self-pay | Admitting: Emergency Medicine

## 2017-03-15 ENCOUNTER — Telehealth: Payer: Self-pay

## 2017-03-15 ENCOUNTER — Emergency Department: Payer: Medicare Other

## 2017-03-15 ENCOUNTER — Observation Stay
Admission: EM | Admit: 2017-03-15 | Discharge: 2017-03-20 | Disposition: A | Discharge status: Skilled Nursing Facility | Payer: Medicare Other | Attending: Internal Medicine | Admitting: Internal Medicine

## 2017-03-15 DIAGNOSIS — Z79899 Other long term (current) drug therapy: Secondary | ICD-10-CM | POA: Insufficient documentation

## 2017-03-15 DIAGNOSIS — K219 Gastro-esophageal reflux disease without esophagitis: Secondary | ICD-10-CM | POA: Diagnosis present

## 2017-03-15 DIAGNOSIS — I6522 Occlusion and stenosis of left carotid artery: Secondary | ICD-10-CM | POA: Insufficient documentation

## 2017-03-15 DIAGNOSIS — I130 Hypertensive heart and chronic kidney disease with heart failure and stage 1 through stage 4 chronic kidney disease, or unspecified chronic kidney disease: Secondary | ICD-10-CM | POA: Insufficient documentation

## 2017-03-15 DIAGNOSIS — I340 Nonrheumatic mitral (valve) insufficiency: Secondary | ICD-10-CM | POA: Insufficient documentation

## 2017-03-15 DIAGNOSIS — L97512 Non-pressure chronic ulcer of other part of right foot with fat layer exposed: Secondary | ICD-10-CM | POA: Diagnosis not present

## 2017-03-15 DIAGNOSIS — I5042 Chronic combined systolic (congestive) and diastolic (congestive) heart failure: Secondary | ICD-10-CM | POA: Insufficient documentation

## 2017-03-15 DIAGNOSIS — E1122 Type 2 diabetes mellitus with diabetic chronic kidney disease: Secondary | ICD-10-CM | POA: Insufficient documentation

## 2017-03-15 DIAGNOSIS — G4733 Obstructive sleep apnea (adult) (pediatric): Secondary | ICD-10-CM | POA: Insufficient documentation

## 2017-03-15 DIAGNOSIS — I495 Sick sinus syndrome: Secondary | ICD-10-CM | POA: Insufficient documentation

## 2017-03-15 DIAGNOSIS — E785 Hyperlipidemia, unspecified: Secondary | ICD-10-CM | POA: Insufficient documentation

## 2017-03-15 DIAGNOSIS — I480 Paroxysmal atrial fibrillation: Secondary | ICD-10-CM | POA: Diagnosis not present

## 2017-03-15 DIAGNOSIS — J9602 Acute respiratory failure with hypercapnia: Secondary | ICD-10-CM | POA: Diagnosis not present

## 2017-03-15 DIAGNOSIS — I4891 Unspecified atrial fibrillation: Secondary | ICD-10-CM | POA: Diagnosis not present

## 2017-03-15 DIAGNOSIS — R531 Weakness: Secondary | ICD-10-CM | POA: Diagnosis not present

## 2017-03-15 DIAGNOSIS — I951 Orthostatic hypotension: Secondary | ICD-10-CM | POA: Diagnosis not present

## 2017-03-15 DIAGNOSIS — I447 Left bundle-branch block, unspecified: Secondary | ICD-10-CM | POA: Insufficient documentation

## 2017-03-15 DIAGNOSIS — I255 Ischemic cardiomyopathy: Secondary | ICD-10-CM | POA: Insufficient documentation

## 2017-03-15 DIAGNOSIS — I2510 Atherosclerotic heart disease of native coronary artery without angina pectoris: Secondary | ICD-10-CM | POA: Diagnosis not present

## 2017-03-15 DIAGNOSIS — E1151 Type 2 diabetes mellitus with diabetic peripheral angiopathy without gangrene: Secondary | ICD-10-CM | POA: Insufficient documentation

## 2017-03-15 DIAGNOSIS — W010XXA Fall on same level from slipping, tripping and stumbling without subsequent striking against object, initial encounter: Secondary | ICD-10-CM | POA: Diagnosis not present

## 2017-03-15 DIAGNOSIS — N189 Chronic kidney disease, unspecified: Secondary | ICD-10-CM

## 2017-03-15 DIAGNOSIS — I10 Essential (primary) hypertension: Secondary | ICD-10-CM | POA: Diagnosis present

## 2017-03-15 DIAGNOSIS — N184 Chronic kidney disease, stage 4 (severe): Secondary | ICD-10-CM | POA: Insufficient documentation

## 2017-03-15 DIAGNOSIS — Z86718 Personal history of other venous thrombosis and embolism: Secondary | ICD-10-CM | POA: Insufficient documentation

## 2017-03-15 DIAGNOSIS — Z7901 Long term (current) use of anticoagulants: Secondary | ICD-10-CM | POA: Diagnosis not present

## 2017-03-15 DIAGNOSIS — R339 Retention of urine, unspecified: Secondary | ICD-10-CM | POA: Diagnosis not present

## 2017-03-15 DIAGNOSIS — Z950 Presence of cardiac pacemaker: Secondary | ICD-10-CM | POA: Diagnosis not present

## 2017-03-15 DIAGNOSIS — I252 Old myocardial infarction: Secondary | ICD-10-CM | POA: Diagnosis not present

## 2017-03-15 DIAGNOSIS — I4892 Unspecified atrial flutter: Secondary | ICD-10-CM | POA: Insufficient documentation

## 2017-03-15 DIAGNOSIS — Z951 Presence of aortocoronary bypass graft: Secondary | ICD-10-CM | POA: Diagnosis not present

## 2017-03-15 DIAGNOSIS — Z6833 Body mass index (BMI) 33.0-33.9, adult: Secondary | ICD-10-CM | POA: Insufficient documentation

## 2017-03-15 DIAGNOSIS — Z794 Long term (current) use of insulin: Secondary | ICD-10-CM | POA: Insufficient documentation

## 2017-03-15 DIAGNOSIS — I129 Hypertensive chronic kidney disease with stage 1 through stage 4 chronic kidney disease, or unspecified chronic kidney disease: Secondary | ICD-10-CM | POA: Diagnosis not present

## 2017-03-15 DIAGNOSIS — R55 Syncope and collapse: Secondary | ICD-10-CM | POA: Diagnosis present

## 2017-03-15 DIAGNOSIS — Z87891 Personal history of nicotine dependence: Secondary | ICD-10-CM | POA: Insufficient documentation

## 2017-03-15 DIAGNOSIS — I5043 Acute on chronic combined systolic (congestive) and diastolic (congestive) heart failure: Secondary | ICD-10-CM | POA: Diagnosis present

## 2017-03-15 DIAGNOSIS — L97522 Non-pressure chronic ulcer of other part of left foot with fat layer exposed: Secondary | ICD-10-CM | POA: Diagnosis not present

## 2017-03-15 DIAGNOSIS — R0689 Other abnormalities of breathing: Secondary | ICD-10-CM

## 2017-03-15 LAB — URINALYSIS, COMPLETE (UACMP) WITH MICROSCOPIC
BACTERIA UA: NONE SEEN
Bilirubin Urine: NEGATIVE
GLUCOSE, UA: 50 mg/dL — AB
Ketones, ur: NEGATIVE mg/dL
LEUKOCYTES UA: NEGATIVE
NITRITE: NEGATIVE
PROTEIN: NEGATIVE mg/dL
SPECIFIC GRAVITY, URINE: 1.009 (ref 1.005–1.030)
pH: 6 (ref 5.0–8.0)

## 2017-03-15 LAB — CBC
HCT: 38 % — ABNORMAL LOW (ref 40.0–52.0)
Hemoglobin: 12.4 g/dL — ABNORMAL LOW (ref 13.0–18.0)
MCH: 30.9 pg (ref 26.0–34.0)
MCHC: 32.6 g/dL (ref 32.0–36.0)
MCV: 94.6 fL (ref 80.0–100.0)
PLATELETS: 182 10*3/uL (ref 150–440)
RBC: 4.02 MIL/uL — ABNORMAL LOW (ref 4.40–5.90)
RDW: 15.6 % — AB (ref 11.5–14.5)
WBC: 8.1 10*3/uL (ref 3.8–10.6)

## 2017-03-15 LAB — BASIC METABOLIC PANEL
Anion gap: 11 (ref 5–15)
BUN: 42 mg/dL — AB (ref 6–20)
CO2: 34 mmol/L — ABNORMAL HIGH (ref 22–32)
CREATININE: 3.12 mg/dL — AB (ref 0.61–1.24)
Calcium: 9.8 mg/dL (ref 8.9–10.3)
Chloride: 98 mmol/L — ABNORMAL LOW (ref 101–111)
GFR calc Af Amer: 20 mL/min — ABNORMAL LOW (ref >60)
GFR, EST NON AFRICAN AMERICAN: 18 mL/min — AB (ref >60)
GLUCOSE: 117 mg/dL — AB (ref 65–99)
Potassium: 3.8 mmol/L (ref 3.5–5.1)
SODIUM: 143 mmol/L (ref 135–145)

## 2017-03-15 LAB — BRAIN NATRIURETIC PEPTIDE: B NATRIURETIC PEPTIDE 5: 92 pg/mL (ref 0.0–100.0)

## 2017-03-15 LAB — GLUCOSE, CAPILLARY
GLUCOSE-CAPILLARY: 226 mg/dL — AB (ref 65–99)
Glucose-Capillary: 225 mg/dL — ABNORMAL HIGH (ref 65–99)

## 2017-03-15 LAB — TROPONIN I

## 2017-03-15 MED ORDER — POLYETHYLENE GLYCOL 3350 17 G PO PACK: 17.0000 g | PACK | Freq: Every day | ORAL | Status: DC | PRN

## 2017-03-15 MED ORDER — BACITRACIN-NEOMYCIN-POLYMYXIN 400-5-5000 EX OINT
TOPICAL_OINTMENT | CUTANEOUS | Status: DC | PRN
  Administered 2017-03-15: 1 via TOPICAL
  Filled 2017-03-15 (×2): qty 1

## 2017-03-15 MED ORDER — INSULIN ASPART 100 UNIT/ML ~~LOC~~ SOLN
0.0000 [IU] | Freq: Every day | SUBCUTANEOUS | Status: DC
  Administered 2017-03-15: 2 [IU] via SUBCUTANEOUS
  Filled 2017-03-15: qty 1

## 2017-03-15 MED ORDER — ONDANSETRON HCL 4 MG PO TABS: 4.0000 mg | ORAL_TABLET | Freq: Four times a day (QID) | ORAL | Status: DC | PRN

## 2017-03-15 MED ORDER — ACETAMINOPHEN 650 MG RE SUPP: 650.0000 mg | Freq: Four times a day (QID) | RECTAL | Status: DC | PRN

## 2017-03-15 MED ORDER — ROSUVASTATIN CALCIUM 10 MG PO TABS
20.0000 mg | ORAL_TABLET | Freq: Every day | ORAL | Status: DC
  Administered 2017-03-15: 20 mg via ORAL
  Filled 2017-03-15: qty 2

## 2017-03-15 MED ORDER — ZOLPIDEM TARTRATE 5 MG PO TABS
5.0000 mg | ORAL_TABLET | Freq: Every evening | ORAL | Status: DC | PRN
  Administered 2017-03-15 – 2017-03-19 (×5): 5 mg via ORAL
  Filled 2017-03-15 (×5): qty 1

## 2017-03-15 MED ORDER — IPRATROPIUM-ALBUTEROL 0.5-2.5 (3) MG/3ML IN SOLN
3.0000 mL | Freq: Once | RESPIRATORY_TRACT | Status: AC
  Administered 2017-03-15: 3 mL via RESPIRATORY_TRACT
  Filled 2017-03-15: qty 3

## 2017-03-15 MED ORDER — CALCITRIOL 0.5 MCG PO CAPS
0.5000 ug | ORAL_CAPSULE | Freq: Every day | ORAL | Status: DC
  Administered 2017-03-16 – 2017-03-20 (×5): 0.5 ug via ORAL
  Filled 2017-03-15 (×5): qty 1

## 2017-03-15 MED ORDER — FINASTERIDE 5 MG PO TABS
5.0000 mg | ORAL_TABLET | Freq: Every day | ORAL | Status: DC
  Administered 2017-03-16 – 2017-03-18 (×3): 5 mg via ORAL
  Filled 2017-03-15 (×3): qty 1

## 2017-03-15 MED ORDER — DOCUSATE SODIUM 100 MG PO CAPS
100.0000 mg | ORAL_CAPSULE | Freq: Two times a day (BID) | ORAL | Status: DC
  Administered 2017-03-16 – 2017-03-20 (×7): 100 mg via ORAL
  Filled 2017-03-15 (×8): qty 1

## 2017-03-15 MED ORDER — VITAMIN D 1000 UNITS PO TABS
1000.0000 [IU] | ORAL_TABLET | Freq: Every day | ORAL | Status: DC
  Administered 2017-03-16 – 2017-03-20 (×5): 1000 [IU] via ORAL
  Filled 2017-03-15 (×5): qty 1

## 2017-03-15 MED ORDER — ONDANSETRON HCL 4 MG/2ML IJ SOLN: 4.0000 mg | Freq: Four times a day (QID) | INTRAMUSCULAR | Status: DC | PRN

## 2017-03-15 MED ORDER — PANTOPRAZOLE SODIUM 40 MG PO TBEC
40.0000 mg | DELAYED_RELEASE_TABLET | Freq: Every day | ORAL | Status: DC
  Administered 2017-03-16 – 2017-03-20 (×5): 40 mg via ORAL
  Filled 2017-03-15 (×5): qty 1

## 2017-03-15 MED ORDER — ACETAMINOPHEN 325 MG PO TABS: 650.0000 mg | ORAL_TABLET | Freq: Four times a day (QID) | ORAL | Status: DC | PRN

## 2017-03-15 MED ORDER — AMIODARONE HCL 200 MG PO TABS
200.0000 mg | ORAL_TABLET | Freq: Every day | ORAL | Status: DC
Start: 2017-03-16 — End: 2017-03-20
  Administered 2017-03-16 – 2017-03-20 (×5): 200 mg via ORAL
  Filled 2017-03-15 (×5): qty 1

## 2017-03-15 MED ORDER — APIXABAN 5 MG PO TABS
5.0000 mg | ORAL_TABLET | Freq: Two times a day (BID) | ORAL | Status: DC
  Administered 2017-03-15 – 2017-03-20 (×10): 5 mg via ORAL
  Filled 2017-03-15 (×10): qty 1

## 2017-03-15 MED ORDER — INSULIN ASPART 100 UNIT/ML ~~LOC~~ SOLN
0.0000 [IU] | Freq: Three times a day (TID) | SUBCUTANEOUS | Status: DC
  Administered 2017-03-15: 3 [IU] via SUBCUTANEOUS
  Administered 2017-03-16 (×3): 2 [IU] via SUBCUTANEOUS
  Administered 2017-03-17: 1 [IU] via SUBCUTANEOUS
  Administered 2017-03-17: 2 [IU] via SUBCUTANEOUS
  Administered 2017-03-18 (×2): 1 [IU] via SUBCUTANEOUS
  Administered 2017-03-18: 2 [IU] via SUBCUTANEOUS
  Administered 2017-03-19: 1 [IU] via SUBCUTANEOUS
  Administered 2017-03-19 – 2017-03-20 (×3): 2 [IU] via SUBCUTANEOUS
  Administered 2017-03-20: 1 [IU] via SUBCUTANEOUS
  Filled 2017-03-15 (×13): qty 1

## 2017-03-15 MED ORDER — LOSARTAN POTASSIUM 25 MG PO TABS
12.5000 mg | ORAL_TABLET | Freq: Every day | ORAL | Status: DC
  Administered 2017-03-16 – 2017-03-17 (×2): 12.5 mg via ORAL
  Filled 2017-03-15 (×3): qty 1

## 2017-03-15 MED ORDER — TORSEMIDE 20 MG PO TABS: 20.0000 mg | ORAL_TABLET | ORAL | Status: DC

## 2017-03-15 NOTE — Progress Notes (Signed)
Patient arrived on unit via stretcher from the ED. Patient is AAOx3, IV intact, foley cathter intact. Urine draining clear yellow with a tinge of blood noted. VS obtained, patient oriented to room. Patient denies pain at this time. Plan of care discussed with patient. Will continue to monitor.

## 2017-03-15 NOTE — ED Triage Notes (Signed)
Pt sent from podiatrist's office with weakness, near syncope. Upon arrival, O2 sat 87% on RA. Pt placed on 2L. Pt states he uses 2L chronically at home, does not have portable tank. Pt reports recent hospitalization for the same.

## 2017-03-15 NOTE — ED Provider Notes (Signed)
[facility] Emergency Department Provider Note       Time seen: ----------------------------------------- 11:57 AM on 03/15/2017 -----------------------------------------     I have reviewed the triage vital signs and the nursing notes.   HISTORY   Chief Complaint Fall; Weakness; and Hypotension    HPI Marco Gilberg Sr. is a 81 y.o. male who presents to the ED for weakness and near syncope. Patient presents for the podiatry office for same after having these symptoms for the last week. Reportedly his oxygen saturations were low on arrival to 87%. He was placed on 2 L which she uses chronically. He denies fevers, chills, has shortness breath, denies vomiting or diarrhea.   Past Medical History:  Diagnosis Date  . AAA (abdominal aortic aneurysm)repaired    a. 12/2006 s/p repair of 6cm infrarenal AAA with 108mm Dacron graft.  . Anemia   . Anginal pain ([place])   . Atrial fibrillation/Flutter    a. Dx 2014;  b. 10/2012 s/p TEE/DCCV;  c. On amio and Eliquis (CHA2DS2VASc = 6).  . Basal cell carcinoma    a. 2013 x 2 back and left neck  . BPH (benign prostatic hypertrophy)   . Carotid artery occlusion    a. 11/2008 s/p R CEA; b. 01/2017 Carotid U/S: RICA <76%, LICA >54%.  . Chronic systolic heart failure ([place])    a. 02/2012 Echo: EF 30-35%, Pulm HTN, and modest RV dysfunction;  b. 10/2012 TEE: EF 25-30%, mild conc LVH, diff HK, mod RV dysfxn, mod dil RA/LA, mild MR, no MS, nl MVR, mod TR, Ao sclerosis; c. 01/2017 Echo: EF 45-50%, diff HK, triv MR, mod dil LA.  . CKD (chronic kidney disease), stage IV ([place])   . Coronary artery disease    a. 1999 PCI RCA;  b. 01/2005 NSTEMI: CABG x 3 (LIMA->LAD, VG->OM, VG->LPL) @ time of MVR.  [name] DVT (deep venous thrombosis) ([place])   . GERD (gastroesophageal reflux disease)   . History of hiatal hernia   . History of shingles   . Hyperlipidemia   . Hypertensive heart disease   . Mitral regurgitation--s/p repair    a. 01/2005  MVR: 28-mm Edwards ET Logix ring annuloplasty; b. 10/2012 TEE: normal fxning MV ring with mild MR, no MS; 01/2017 Echo: Triv MR.  . Mixed Ischemic and Non-ischemic Cardiomyopathy    a. 02/2012 Echo: EF 30-35%, Pulm HTN, and modest RV dysfunction;  b. 10/2012 TEE: EF 25-30%, mild conc LVH, diff HK, mod RV dysfxn, mod dil RA/LA, mild MR, no MS, nl MVR, mod TR, Ao sclerosis; c. 01/2017 Echo: EF 45-50%.  . Myocardial infarction ([place])    2006  . Obesity   . Orthopnea   . Orthostatic hypotension    a. 11/2016 -> meds reduced;  b. 01/2017 admitted w/ presyncope and orthostasis->Meds held.  . Oxygen deficit    a. Wears 2 lpm most of the day and @ HS.  . Presence of permanent cardiac pacemaker   . PVC (premature ventricular contraction)    a. 02/2009 s/p RFCA ([place]).  . Sick sinus syndrome ([place])    a. 01/2005 s/p PPM 2/2 heart block following CABG;  b. 05/2012 Gen Change: MDT Adapta ADDRL1 DC PPM, ser # [id number].  . Sleep apnea    a. Intolerant of CPAP  . Type II diabetes mellitus [facility])     Patient Active Problem List   Diagnosis Date Noted  . Near syncope 02/02/2017  . Pressure injury of skin 02/02/2017  .  Orthostasis 12/04/2016  . LGI bleed 06/10/2016  . Acute blood loss anemia 06/10/2016  . Chronic respiratory failure with hypoxia, on home O2 therapy ([place]) 06/10/2016  . PAD (peripheral artery disease) ([place]) 06/05/2016  . Chest pain 10/28/2015  . Gastroenteritis   . Diarrhea of infectious origin   . Emesis   . S/P CABG (coronary artery bypass graft)   . Coronary artery disease involving native coronary artery of native heart without angina pectoris   . PAF (paroxysmal atrial fibrillation) ([place])   . Absolute anemia 01/28/2015  . Atherosclerosis of coronary artery 01/28/2015  . Barrett's esophagus 01/28/2015  . Basal cell carcinoma of face 01/28/2015  . Benign fibroma of prostate 01/28/2015  . CCF (congestive cardiac failure) ([place]) 01/28/2015  . Diabetes ([place]) 01/28/2015  .  Gastro-esophageal reflux disease without esophagitis 01/28/2015  . Irritable colon 01/28/2015  . Cannot sleep 01/28/2015  . Pleural cavity effusion 01/28/2015  . Apnea, sleep 01/28/2015  . Hematuria 12/21/2014  . Left hand weakness 09/23/2013  . Essential hypertension 06/23/2013  . Chronic combined systolic and diastolic CHF (congestive heart failure) ([place]) 06/10/2013  . Hypotension, postural 06/10/2013  . Hyperkalemia 06/10/2013  . Adjustment of cardiac pacemaker 06/10/2013  . Atrial fibrillation and flutter ([place]) 10/22/2012  . Pacemaker-Medtronic 06/19/2012  . Pulmonary hypertension ([place]) 03/18/2012  . Occlusion and stenosis of carotid artery without mention of cerebral infarction 02/08/2012  . Morbid obesity ([place]) 08/05/2010  . Sinoatrial node dysfunction ([facility]) 06/02/2010  . Stage 4 chronic renal impairment associated with type 2 diabetes mellitus ([place]) 08/09/2009  . AAA 04/21/2009  . Ischemic cardiomyopathy  s/p CABG 11/23/2008  . MITRAL REGURGITATION 11/22/2008  . CAD (coronary artery disease) 11/22/2008  . Hyperlipidemia 03/02/2008  . ALLERGIC RHINITIS 03/02/2008  . OSA (obstructive sleep apnea) 03/02/2008    Past Surgical History:  Procedure Laterality Date  . ABDOMINAL AORTIC ANEURYSM REPAIR      May 2008,   . CARDIAC CATHETERIZATION  2006   @ [facility]  . CATARACT EXTRACTION     right  . CATARACT EXTRACTION W/PHACO Left 10/04/2016   Procedure: CATARACT EXTRACTION PHACO AND INTRAOCULAR LENS PLACEMENT (IOC);  Surgeon: Estill Cotta, MD;  Location: [facility] ORS;  Service: Ophthalmology;  Laterality: Left;  [place] 01:21 AP% 25.9 CDE 39.08 Fluid pack lot # [id number]   . CORONARY ARTERY BYPASS GRAFT     CABG with a  LIMA to the LAD, SVG to circumflex, SVG to posterolateral [id number].  Randolm Idol / REPLACE / REMOVE PACEMAKER  2006   Dr. [name]  . MITRAL VALVE REPAIR  2006    mitral valve repair with a #28 Edwards logic ring  . PACEMAKER PLACEMENT    . PERMANENT PACEMAKER GENERATOR  CHANGE N/A 06/26/2012   Procedure: PERMANENT PACEMAKER GENERATOR CHANGE;  Surgeon: Deboraha Sprang, MD;  Location: [facility] CATH LAB;  Service: Cardiovascular;  Laterality: N/A;  . SKIN BIOPSY     left wrist; right elbow  . SKIN CANCER EXCISION      x 6  . SKIN CANCER EXCISION    . TRANSESOPHAGEAL ECHOCARDIOGRAM    . VASECTOMY      Allergies Shellfish allergy  Social History Social History  Substance Use Topics  . Smoking status: Former Smoker    Packs/day: 1.50    Years: 34.00    Types: Cigarettes    Quit date: 08/28/1987  . Smokeless tobacco: Never Used  . Alcohol use 0.6 oz/week    1 Cans of beer per week  Comment: Occasionally but none since his surgery in June    Review of Systems Constitutional: Negative for fever. Eyes: Negative for vision changes ENT:  Negative for congestion, sore throat Cardiovascular: Negative for chest pain. Respiratory: Positive for shortness of breath Gastrointestinal: Negative for abdominal pain, vomiting and diarrhea. Genitourinary: Negative for dysuria. Musculoskeletal: Negative for back pain. Skin: Negative for rash. Neurological: Negative for headaches, positive for weakness  All systems negative/normal/unremarkable except as stated in the HPI  ____________________________________________   PHYSICAL EXAM:  VITAL SIGNS: ED Triage Vitals  Enc Vitals Group     BP --      Pulse Rate 03/15/17 1137 78     Resp 03/15/17 1137 (!) 22     Temp 03/15/17 1137 97.8 F (36.6 C)     Temp Source 03/15/17 1137 Oral     SpO2 03/15/17 1137 (!) 87 %     Weight 03/15/17 1140 245 lb (111.1 kg)     Height 03/15/17 1140 6' (1.829 m)     Head Circumference --      Peak Flow --      Pain Score 03/15/17 1137 0     Pain Loc --      Pain Edu? --      Excl. in [place]? --     Constitutional: Alert and oriented. Morbidly obese, no distress Eyes: Conjunctivae are normal. Normal extraocular movements. ENT   Head: Normocephalic and atraumatic.    Nose: No congestion/rhinnorhea.   Mouth/Throat: Mucous membranes are moist.   Neck: No stridor. Cardiovascular: Normal rate, regular rhythm. No murmurs, rubs, or gallops. Respiratory: Normal respiratory effort without tachypnea nor retractions. Breath sounds are clear and equal bilaterally. No wheezes/rales/rhonchi. Gastrointestinal: Soft and nontender. Normal bowel sounds Musculoskeletal: Nontender with normal range of motion in extremities. No lower extremity tenderness nor edema. Neurologic:  Normal speech and language. No gross focal neurologic deficits are appreciated.  Skin:  Skin is warm, dry and intact. No rash noted. Psychiatric: Mood and affect are normal. Speech and behavior are normal.  ____________________________________________  EKG: Interpreted by me. Atrial paced rhythm with prolonged conduction, rate is 81 bpm  ____________________________________________  ED COURSE:  Pertinent labs & imaging results that were available during my care of the patient were reviewed by me and considered in my medical decision making (see chart for details). Patient presents for weakness, we will assess with labs and imaging as indicated.   Procedures ____________________________________________   LABS (pertinent positives/negatives)  Labs Reviewed  BASIC METABOLIC PANEL - Abnormal; Notable for the following:       Result Value   Chloride 98 (*)    CO2 34 (*)    Glucose, Bld 117 (*)    BUN 42 (*)    Creatinine, Ser 3.12 (*)    GFR calc non Af Amer 18 (*)    GFR calc Af Amer 20 (*)    All other components within normal limits  CBC - Abnormal; Notable for the following:    RBC 4.02 (*)    Hemoglobin 12.4 (*)    HCT 38.0 (*)    RDW 15.6 (*)    All other components within normal limits  BLOOD GAS, VENOUS - Abnormal; Notable for the following:    pCO2, Ven 65 (*)    Bicarbonate 41.2 (*)    Acid-Base Excess 13.8 (*)    All other components within normal limits  TROPONIN I   URINALYSIS, COMPLETE (UACMP) WITH MICROSCOPIC  CBG MONITORING, ED  RADIOLOGY Images were viewed by me  Chest x-ray IMPRESSION: 1. Stable exam. 2. Cardiomegaly with hyperexpansion chronic interstitial opacity. 3. Bibasilar scarring stable appearance of tiny effusions. ____________________________________________  FINAL ASSESSMENT AND PLAN  Weakness, Chronic kidney disease, hypercarbia  Plan: Patient's labs and imaging were dictated above. Patient had presented for weakness and hypoxia off his typical 2 L of oxygen. He received appears weak and states he does not feel strong enough to go home. He states he is not sure how he would get into his house. Labs are slightly worse than his normal. He may benefit from some BiPAP or DuoNeb's to try to improve his hypercarbia which is likely due to obesity.   Earleen Newport, MD   Note: This note was generated in part or whole with voice recognition software. Voice recognition is usually quite accurate but there are transcription errors that can and very often do occur. I apologize for any typographical errors that were not detected and corrected.     Earleen Newport, MD 03/15/17 1336

## 2017-03-15 NOTE — ED Notes (Signed)
Admission MD at bedside.  

## 2017-03-15 NOTE — H&P (Signed)
[facility] [place] NAME: Marco [name]    MR#:  [id number]  DATE OF BIRTH:  November 30, 1935  DATE OF ADMISSION:  03/15/2017  PRIMARY CARE PHYSICIAN: [name], MD   REQUESTING/REFERRING PHYSICIAN: Jimmye Norman, MD  CHIEF COMPLAINT:   Chief Complaint  Patient presents with  . Fall  . Weakness  . Hypotension    HISTORY OF PRESENT ILLNESS:  Marco [name]  is a 81 y.o. male who presents with Near syncopal episode followed by subsequent weakness. Patient presents to the ED with shortness of breath as well. He had some mild hypoxia on admission to the ED, but he states that he is typically on 2 L of oxygen at home. His O2 sats corrected with supplemental oxygen via nasal cannula  PAST MEDICAL HISTORY:   Past Medical History:  Diagnosis Date  . AAA (abdominal aortic aneurysm)repaired    a. 12/2006 s/p repair of 6cm infrarenal AAA with 51mm Dacron graft.  . Anemia   . Anginal pain ([place])   . Atrial fibrillation/Flutter    a. Dx 2014;  b. 10/2012 s/p TEE/DCCV;  c. On amio and Eliquis (CHA2DS2VASc = 6).  . Basal cell carcinoma    a. 2013 x 2 back and left neck  . BPH (benign prostatic hypertrophy)   . Carotid artery occlusion    a. 11/2008 s/p R CEA; b. 01/2017 Carotid U/S: RICA <67%, LICA >20%.  . Chronic systolic heart failure ([place])    a. 02/2012 Echo: EF 30-35%, Pulm HTN, and modest RV dysfunction;  b. 10/2012 TEE: EF 25-30%, mild conc LVH, diff HK, mod RV dysfxn, mod dil RA/LA, mild MR, no MS, nl MVR, mod TR, Ao sclerosis; c. 01/2017 Echo: EF 45-50%, diff HK, triv MR, mod dil LA.  . CKD (chronic kidney disease), stage IV ([place])   . Coronary artery disease    a. 1999 PCI RCA;  b. 01/2005 NSTEMI: CABG x 3 (LIMA->LAD, VG->OM, VG->LPL) @ time of MVR.  [name] DVT (deep venous thrombosis) ([place])   . GERD (gastroesophageal reflux disease)   . History of hiatal hernia   . History of shingles   . Hyperlipidemia   . Hypertensive heart disease    . Mitral regurgitation--s/p repair    a. 01/2005 MVR: 28-mm Edwards ET Logix ring annuloplasty; b. 10/2012 TEE: normal fxning MV ring with mild MR, no MS; 01/2017 Echo: Triv MR.  . Mixed Ischemic and Non-ischemic Cardiomyopathy    a. 02/2012 Echo: EF 30-35%, Pulm HTN, and modest RV dysfunction;  b. 10/2012 TEE: EF 25-30%, mild conc LVH, diff HK, mod RV dysfxn, mod dil RA/LA, mild MR, no MS, nl MVR, mod TR, Ao sclerosis; c. 01/2017 Echo: EF 45-50%.  . Myocardial infarction ([place])    2006  . Obesity   . Orthopnea   . Orthostatic hypotension    a. 11/2016 -> meds reduced;  b. 01/2017 admitted w/ presyncope and orthostasis->Meds held.  . Oxygen deficit    a. Wears 2 lpm most of the day and @ HS.  . Presence of permanent cardiac pacemaker   . PVC (premature ventricular contraction)    a. 02/2009 s/p RFCA ([place]).  . Sick sinus syndrome ([place])    a. 01/2005 s/p PPM 2/2 heart block following CABG;  b. 05/2012 Gen Change: MDT Adapta ADDRL1 DC PPM, ser # [id number].  . Sleep apnea    a. Intolerant of CPAP  . Type II diabetes mellitus ([place])  PAST SURGICAL HISTORY:   Past Surgical History:  Procedure Laterality Date  . ABDOMINAL AORTIC ANEURYSM REPAIR      May 2008,   . CARDIAC CATHETERIZATION  2006   @ [facility]  . CATARACT EXTRACTION     right  . CATARACT EXTRACTION W/PHACO Left 10/04/2016   Procedure: CATARACT EXTRACTION PHACO AND INTRAOCULAR LENS PLACEMENT (IOC);  Surgeon: Estill Cotta, MD;  Location: [facility] ORS;  Service: Ophthalmology;  Laterality: Left;  [place] 01:21 AP% 25.9 CDE 39.08 Fluid pack lot # [id number]   . CORONARY ARTERY BYPASS GRAFT     CABG with a  LIMA to the LAD, SVG to circumflex, SVG to posterolateral [id number].  Randolm Idol / REPLACE / REMOVE PACEMAKER  2006   Dr. [name]  . MITRAL VALVE REPAIR  2006    mitral valve repair with a #28 Edwards logic ring  . PACEMAKER PLACEMENT    . PERMANENT PACEMAKER GENERATOR CHANGE N/A 06/26/2012   Procedure: PERMANENT PACEMAKER GENERATOR  CHANGE;  Surgeon: Deboraha Sprang, MD;  Location: [facility] CATH LAB;  Service: Cardiovascular;  Laterality: N/A;  . SKIN BIOPSY     left wrist; right elbow  . SKIN CANCER EXCISION      x 6  . SKIN CANCER EXCISION    . TRANSESOPHAGEAL ECHOCARDIOGRAM    . VASECTOMY      SOCIAL HISTORY:   Social History  Substance Use Topics  . Smoking status: Former Smoker    Packs/day: 1.50    Years: 34.00    Types: Cigarettes    Quit date: 08/28/1987  . Smokeless tobacco: Never Used  . Alcohol use 0.6 oz/week    1 Cans of beer per week     Comment: Occasionally but none since his surgery in June    FAMILY HISTORY:   Family History  Problem Relation Age of Onset  . Heart attack Mother 76  . Diabetes Sister   . Hypertension Sister   . Heart attack Sister   . Heart attack Brother 41       3 Brothers deceased from [place] in 52's or 57's  . Heart disease Brother        Heart Disease before age 21  . Heart attack Father 63  . Diabetes Father        Amputation: bilateral feet  . Heart disease Father   . Heart attack Sister   . Heart attack Sister   . Heart attack Brother 21  . Heart attack Brother 12  . Diabetes Son     DRUG ALLERGIES:   Allergies  Allergen Reactions  . Shellfish Allergy Anaphylaxis    MEDICATIONS AT HOME:   Prior to Admission medications   Medication Sig Start Date End Date Taking? Authorizing Provider  albuterol (PROVENTIL) (2.5 MG/3ML) 0.083% nebulizer solution Take 3 mLs (2.5 mg total) by nebulization every 6 (six) hours as needed for wheezing or shortness of breath. 02/07/17  Yes Wieting, Delfino Lovett, MD  amiodarone (PACERONE) 200 MG tablet take 1 tablet by mouth once daily 01/08/17  Yes Gollan, Kathlene November, MD  calcitRIOL (ROCALTROL) 0.5 MCG capsule take 1 capsule by mouth once daily 07/04/16  Yes [name], MD  cholecalciferol (VITAMIN D) 1000 UNITS tablet Take 1,000 Units by mouth daily.   Yes [provider]  docusate sodium (COLACE) 100 MG capsule  Take 100 mg by mouth 2 (two) times daily.     Yes [provider]  ELIQUIS 5 MG TABS tablet take 1  tablet by mouth twice a day 07/31/16  Yes [name], MD  finasteride (PROSCAR) 5 MG tablet Take 5 mg by mouth daily.   Yes [provider]  insulin lispro protamine-lispro (HUMALOG MIX 75/25) (75-25) 100 UNIT/ML SUSP injection Inject 5 Units into the skin 2 (two) times daily with a meal. 02/07/17  Yes Wieting, Richard, MD  losartan (COZAAR) 25 MG tablet 0.5 tablets daily orally 02/07/17  Yes Wieting, Richard, MD  Multiple Vitamins-Minerals (MULTI-VITAMIN GUMMIES) CHEW Chew by mouth daily.   Yes [provider]  omeprazole (PRILOSEC) 20 MG capsule Take 20 mg by mouth daily. Monday, Wednesday, friday   Yes [provider]  polyethylene glycol (MIRALAX / GLYCOLAX) packet Take 17 g by mouth daily as needed for mild constipation. 06/12/16  Yes Fritzi Mandes, MD  rosuvastatin (CRESTOR) 20 MG tablet take 1 tablet by mouth at bedtime 10/02/16  Yes Gollan, Kathlene November, MD  torsemide (DEMADEX) 20 MG tablet Take 1-2 tablets (20-40 mg total) by mouth as directed. 20 mg once daily when weight is less than 248. Take 40 mg (2 tablets) once daily when weight is 248 or higher. 02/23/17 05/24/17 Yes Gollan, Kathlene November, MD  triamcinolone (NASACORT ALLERGY 24HR) 55 MCG/ACT AERO nasal inhaler Place 2 sprays into the nose daily.   Yes [provider]  ACCU-CHEK FASTCLIX LANCETS MISC TEST twice a day if needed 07/04/16   [name], MD  ACCU-CHEK SMARTVIEW test strip TEST twice a day 07/05/16   [name], MD  BD INSULIN SYRINGE ULTRAFINE 31G X 5/16" 0.3 ML MISC use twice a day as directed 04/03/16   [name], MD  OXYGEN Inhale 1.5 L into the lungs.    [provider]    REVIEW OF SYSTEMS:  Review of Systems  Constitutional: Negative for chills, fever, malaise/fatigue and weight loss.  HENT: Negative for ear pain, hearing loss and  tinnitus.   Eyes: Negative for blurred vision, double vision, pain and redness.  Respiratory: Positive for shortness of breath. Negative for cough and hemoptysis.   Cardiovascular: Negative for chest pain, palpitations, orthopnea and leg swelling.  Gastrointestinal: Negative for abdominal pain, constipation, diarrhea, nausea and vomiting.  Genitourinary: Negative for dysuria, frequency and hematuria.  Musculoskeletal: Negative for back pain, joint pain and neck pain.  Skin:       No acne, rash, or lesions  Neurological: Positive for weakness. Negative for dizziness, tremors and focal weakness.       Near syncope  Endo/Heme/Allergies: Negative for polydipsia. Does not bruise/bleed easily.  Psychiatric/Behavioral: Negative for depression. The patient is not nervous/anxious and does not have insomnia.      VITAL SIGNS:   Vitals:   03/15/17 1140 03/15/17 1200 03/15/17 1300 03/15/17 1400  BP:  131/73 (!) 158/97 (!) 145/79  Pulse:  76 74 79  Resp:  17 18 18   Temp:      TempSrc:      SpO2:  99% 100% 100%  Weight: 111.1 kg (245 lb)     Height: 6' (1.829 m)      Wt Readings from Last 3 Encounters:  03/15/17 111.1 kg (245 lb)  03/14/17 111.1 kg (245 lb)  03/08/17 110.7 kg (244 lb)    PHYSICAL EXAMINATION:  Physical Exam  Vitals reviewed. Constitutional: He is oriented to person, place, and time. He appears well-developed and well-nourished. No distress.  HENT:  Head: Normocephalic and atraumatic.  Mouth/Throat: Oropharynx is clear and moist.  Eyes: Pupils are equal, round, and reactive to light. Conjunctivae and EOM are normal. No scleral icterus.  Neck: Normal range of motion. Neck supple. No JVD present. No thyromegaly present.  Cardiovascular: Normal rate, regular rhythm and intact distal pulses.  Exam reveals no gallop and no friction rub.   Murmur heard. Respiratory: Effort normal and breath sounds normal. No respiratory distress. He has no wheezes. He has no rales.  GI:  Soft. Bowel sounds are normal. He exhibits no distension. There is no tenderness.  Musculoskeletal: Normal range of motion. He exhibits no edema.  No arthritis, no gout  Lymphadenopathy:    He has no cervical adenopathy.  Neurological: He is alert and oriented to person, place, and time. No cranial nerve deficit.  No dysarthria, no aphasia  Skin: Skin is warm and dry. No rash noted. No erythema.  Psychiatric: He has a normal mood and affect. His behavior is normal. Judgment and thought content normal.    LABORATORY PANEL:   CBC  Recent Labs Lab 03/15/17 1147  WBC 8.1  HGB 12.4*  HCT 38.0*  PLT 182   ------------------------------------------------------------------------------------------------------------------  Chemistries   Recent Labs Lab 03/15/17 1147  NA 143  K 3.8  CL 98*  CO2 34*  GLUCOSE 117*  BUN 42*  CREATININE 3.12*  CALCIUM 9.8   ------------------------------------------------------------------------------------------------------------------  Cardiac Enzymes  Recent Labs Lab 03/15/17 1147  TROPONINI <0.03   ------------------------------------------------------------------------------------------------------------------  RADIOLOGY:  Dg Chest 2 View  Result Date: 03/15/2017 CLINICAL DATA:  Weakness and near syncope. EXAM: CHEST  2 VIEW COMPARISON:  02/04/2017 FINDINGS: AP and lateral views of the chest show enlargement cardiopericardial silhouette, stable. There is bibasilar parenchymal and pleural scarring with small probably chronic diffusion. Lungs appear hyperexpanded. Left-sided delete permanent pacemaker again noted. The patient is status post cardiac valve replacement. IMPRESSION: 1. Stable exam. 2. Cardiomegaly with hyperexpansion chronic interstitial opacity. 3. Bibasilar scarring stable appearance of tiny effusions. Electronically Signed   By: Misty Stanley M.D.   On: 03/15/2017 12:38    EKG:   Orders placed or performed during the  hospital encounter of 03/15/17  . ED EKG  . ED EKG  . EKG 12-Lead  . EKG 12-Lead  . ED EKG  . ED EKG    IMPRESSION AND PLAN:  Principal Problem:   Near syncope - patient was at his podiatrist's office when he had the episode of near syncope. He states that he's been having increased fatigue and shortness of breath. He does not feel like he be able to take care of himself at home tonight. We will monitor him on telemetry tonight, check his BNP for potential heart failure exacerbation Active Problems:   Chronic combined systolic and diastolic CHF (congestive heart failure) (HCC) - continue home meds, check BNP as above   Atrial fibrillation and flutter ([place]) - patient has an atrial paced rhythm on EKG, continue home meds including anticoagulation   Essential hypertension - continue home meds   Atherosclerosis of coronary artery - continue home meds   Gastro-esophageal reflux disease without esophagitis - home dose PPI  All the records are reviewed and case discussed with ED provider. Management plans discussed with the patient and/or family.  DVT PROPHYLAXIS: Systemic anticoagulation  GI PROPHYLAXIS: PPI  ADMISSION STATUS: Observation  CODE STATUS: Full Code Status History    Date Active Date Inactive Code Status Order ID Comments User Context   02/02/2017  1:52 AM 02/07/2017  6:50 PM Full Code [id number]  Hugelmeyer, Ubaldo Glassing, DO  Inpatient   06/10/2016  9:09 PM 06/12/2016  3:20 PM Full Code [id number]  Idelle Crouch, MD Inpatient   10/28/2015 11:14 AM 10/29/2015  3:01 PM Full Code [id number]  Hillary Bow, MD ED      TOTAL TIME TAKING CARE OF THIS PATIENT: 40 minutes.   Janelly Switalski FIELDING 03/15/2017, 2:28 PM  [facility]  Office  [phone]  CC: Primary care physician; [name], MD  Note:  This document was prepared using Dragon voice recognition software and may include unintentional dictation errors.

## 2017-03-15 NOTE — Telephone Encounter (Signed)
FYISkeet Simmer called to report he contacted patient to schedule a appointment. Patient's wife reported he is back at [facility] ER. While seeing podiatrist today patient was feeling faint and BP was 80/53 so he went to ER for evaluation.

## 2017-03-15 NOTE — ED Notes (Signed)
ED Provider at bedside. 

## 2017-03-15 NOTE — ED Notes (Signed)
From [place] , hypotensive / diaphoretic

## 2017-03-15 NOTE — ED Notes (Signed)
Placed a condom catheter on pt bc pt isn't able to use urinal

## 2017-03-15 NOTE — Telephone Encounter (Signed)
[email or web address] 

## 2017-03-16 DIAGNOSIS — I5042 Chronic combined systolic (congestive) and diastolic (congestive) heart failure: Secondary | ICD-10-CM | POA: Diagnosis not present

## 2017-03-16 DIAGNOSIS — R55 Syncope and collapse: Secondary | ICD-10-CM | POA: Diagnosis not present

## 2017-03-16 DIAGNOSIS — I6529 Occlusion and stenosis of unspecified carotid artery: Secondary | ICD-10-CM | POA: Diagnosis not present

## 2017-03-16 DIAGNOSIS — I951 Orthostatic hypotension: Secondary | ICD-10-CM | POA: Diagnosis not present

## 2017-03-16 LAB — BASIC METABOLIC PANEL
ANION GAP: 7 (ref 5–15)
BUN: 41 mg/dL — AB (ref 6–20)
CHLORIDE: 97 mmol/L — AB (ref 101–111)
CO2: 35 mmol/L — ABNORMAL HIGH (ref 22–32)
Calcium: 9.2 mg/dL (ref 8.9–10.3)
Creatinine, Ser: 2.8 mg/dL — ABNORMAL HIGH (ref 0.61–1.24)
GFR calc Af Amer: 23 mL/min — ABNORMAL LOW (ref >60)
GFR calc non Af Amer: 20 mL/min — ABNORMAL LOW (ref >60)
Glucose, Bld: 190 mg/dL — ABNORMAL HIGH (ref 65–99)
POTASSIUM: 3.3 mmol/L — AB (ref 3.5–5.1)
SODIUM: 139 mmol/L (ref 135–145)

## 2017-03-16 LAB — GLUCOSE, CAPILLARY
GLUCOSE-CAPILLARY: 151 mg/dL — AB (ref 65–99)
GLUCOSE-CAPILLARY: 173 mg/dL — AB (ref 65–99)
Glucose-Capillary: 160 mg/dL — ABNORMAL HIGH (ref 65–99)
Glucose-Capillary: 189 mg/dL — ABNORMAL HIGH (ref 65–99)

## 2017-03-16 LAB — CBC
HEMATOCRIT: 34.5 % — AB (ref 40.0–52.0)
HEMOGLOBIN: 11.5 g/dL — AB (ref 13.0–18.0)
MCH: 31 pg (ref 26.0–34.0)
MCHC: 33.4 g/dL (ref 32.0–36.0)
MCV: 93 fL (ref 80.0–100.0)
Platelets: 150 10*3/uL (ref 150–440)
RBC: 3.71 MIL/uL — AB (ref 4.40–5.90)
RDW: 15.6 % — AB (ref 11.5–14.5)
WBC: 7.5 10*3/uL (ref 3.8–10.6)

## 2017-03-16 LAB — MAGNESIUM: Magnesium: 2.2 mg/dL (ref 1.7–2.4)

## 2017-03-16 MED ORDER — ROSUVASTATIN CALCIUM 10 MG PO TABS
10.0000 mg | ORAL_TABLET | Freq: Every day | ORAL | Status: DC
  Administered 2017-03-16 – 2017-03-19 (×4): 10 mg via ORAL
  Filled 2017-03-16 (×4): qty 1

## 2017-03-16 MED ORDER — POTASSIUM CHLORIDE CRYS ER 20 MEQ PO TBCR
20.0000 meq | EXTENDED_RELEASE_TABLET | Freq: Every day | ORAL | Status: DC
  Administered 2017-03-16 – 2017-03-19 (×4): 20 meq via ORAL
  Filled 2017-03-16 (×4): qty 1

## 2017-03-16 MED ORDER — TORSEMIDE 20 MG PO TABS
40.0000 mg | ORAL_TABLET | Freq: Every day | ORAL | Status: DC
  Filled 2017-03-16: qty 2

## 2017-03-16 MED ORDER — TORSEMIDE 20 MG PO TABS
20.0000 mg | ORAL_TABLET | Freq: Every day | ORAL | Status: DC
  Administered 2017-03-16 – 2017-03-17 (×2): 20 mg via ORAL
  Filled 2017-03-16 (×2): qty 1

## 2017-03-16 MED ORDER — TAMSULOSIN HCL 0.4 MG PO CAPS: 0.4000 mg | ORAL_CAPSULE | Freq: Every day | ORAL | 2 refills | Status: DC

## 2017-03-16 MED ORDER — MIDODRINE HCL 5 MG PO TABS: 5.0000 mg | ORAL_TABLET | Freq: Three times a day (TID) | ORAL | Status: DC

## 2017-03-16 MED ORDER — MIDODRINE HCL 2.5 MG PO TABS
10.0000 mg | ORAL_TABLET | Freq: Three times a day (TID) | ORAL | Status: DC
  Administered 2017-03-16 – 2017-03-17 (×3): 10 mg via ORAL
  Filled 2017-03-16: qty 2
  Filled 2017-03-16 (×3): qty 4

## 2017-03-16 MED ORDER — TAMSULOSIN HCL 0.4 MG PO CAPS
0.4000 mg | ORAL_CAPSULE | Freq: Every day | ORAL | Status: DC
  Administered 2017-03-16 – 2017-03-18 (×3): 0.4 mg via ORAL
  Filled 2017-03-16 (×5): qty 1

## 2017-03-16 NOTE — Care Management (Signed)
Patient dropped his blood pressure to 70 systolic while sitting on the side of the bed when physical therapy was treating.  It is for this reason has recommended skilled nursing facility.  If patient was not so orthostatic, recommendation most likely would be return home with resumption of home health.  Patient dropped his systolic from 524 to 90 systolic when orthostatics taken this morning

## 2017-03-16 NOTE — Progress Notes (Signed)
[facility] [place] NAME: Marco [name]    MR#:  [id number]  DATE OF BIRTH:  06/02/1936  SUBJECTIVE:  CHIEF COMPLAINT:   Chief Complaint  Patient presents with  . Fall  . Weakness  . Hypotension   - admitted with a near-syncopal episode yesterday. Feels much stronger today. -Orthostatics are pending.  REVIEW OF SYSTEMS:  Review of Systems  Constitutional: Positive for malaise/fatigue. Negative for chills and fever.  HENT: Negative for congestion, ear discharge, hearing loss and nosebleeds.   Eyes: Negative for blurred vision and double vision.  Respiratory: Negative for cough, shortness of breath and wheezing.   Cardiovascular: Negative for chest pain, palpitations and leg swelling.  Gastrointestinal: Negative for abdominal pain, constipation, diarrhea, nausea and vomiting.  Genitourinary: Negative for dysuria.  Musculoskeletal: Negative for myalgias.  Neurological: Positive for dizziness. Negative for speech change, focal weakness, seizures and headaches.    DRUG ALLERGIES:   Allergies  Allergen Reactions  . Shellfish Allergy Anaphylaxis    VITALS:  Blood pressure 126/64, pulse 76, temperature 98.4 F (36.9 C), temperature source Oral, resp. rate 18, height 6' (1.829 m), weight 108.5 kg (239 lb 3.2 oz), SpO2 99 %.  PHYSICAL EXAMINATION:  Physical Exam  GENERAL:  81 y.o.-year-old patient lying in the bed with no acute distress.  EYES: Pupils equal, round, reactive to light and accommodation. No scleral icterus. Extraocular muscles intact.  HEENT: Head atraumatic, normocephalic. Oropharynx and nasopharynx clear.  NECK:  Supple, no jugular venous distention. No thyroid enlargement, no tenderness.  LUNGS: Normal breath sounds bilaterally, no wheezing, rales,rhonchi or crepitation. No use of accessory muscles of respiration.  CARDIOVASCULAR: S1, S2 normal. No rubs, or gallops. 2/6 systolic murmur present, has a pacemaker in  place ABDOMEN: Soft, nontender, nondistended. Bowel sounds present. No organomegaly or mass.  EXTREMITIES: No pedal edema, cyanosis, or clubbing.  NEUROLOGIC: Cranial nerves II through XII are intact. Muscle strength 5/5 in all extremities. Sensation intact. Gait not checked.  PSYCHIATRIC: The patient is alert and oriented x 3.  SKIN: No obvious rash, lesion, or ulcer.    LABORATORY PANEL:   CBC  Recent Labs Lab 03/16/17 0614  WBC 7.5  HGB 11.5*  HCT 34.5*  PLT 150   ------------------------------------------------------------------------------------------------------------------  Chemistries   Recent Labs Lab 03/16/17 0614  NA 139  K 3.3*  CL 97*  CO2 35*  GLUCOSE 190*  BUN 41*  CREATININE 2.80*  CALCIUM 9.2   ------------------------------------------------------------------------------------------------------------------  Cardiac Enzymes  Recent Labs Lab 03/15/17 1147  TROPONINI <0.03   ------------------------------------------------------------------------------------------------------------------  RADIOLOGY:  Dg Chest 2 View  Result Date: 03/15/2017 CLINICAL DATA:  Weakness and near syncope. EXAM: CHEST  2 VIEW COMPARISON:  02/04/2017 FINDINGS: AP and lateral views of the chest show enlargement cardiopericardial silhouette, stable. There is bibasilar parenchymal and pleural scarring with small probably chronic diffusion. Lungs appear hyperexpanded. Left-sided delete permanent pacemaker again noted. The patient is status post cardiac valve replacement. IMPRESSION: 1. Stable exam. 2. Cardiomegaly with hyperexpansion chronic interstitial opacity. 3. Bibasilar scarring stable appearance of tiny effusions. Electronically Signed   By: Misty Stanley M.D.   On: 03/15/2017 12:38    EKG:   Orders placed or performed during the hospital encounter of 03/15/17  . ED EKG  . ED EKG  . EKG 12-Lead  . EKG 12-Lead  . ED EKG  . ED EKG    ASSESSMENT AND PLAN:     #1 Near syncope- was admitted last  month for the same. Noted to have significant orthostases at that time and his BP meds were discontinued at discharge and also was started on midodrine and florinef. - currently on low dose losartan and torsemide at home again. don't see midodrine or florinef ordered - so check orthostatics -if positive, will address - also carotid dopplers with >70% left carotid stenosis-the following with the vascular surgeon for that. Discussed with on-call vascular surgeon who recommended outpatient follow-up for an angiogram if needed because patient is symptomatic. Patient cannot get a CT angiogram due to his CK D and cannot get an MRA due to his pacemaker. - PT consulted  #2 Chronic combined systolic and diastolic- stable, decrease the dose of torsemide. On low dose losartan.Echo with EF of 45-50%. -Follows with cardiology as outpatient. Also has a pacemaker.  #3 atrial fibrillation with sick sinus syndrome-status post pacemaker. Also on amiodarone. -On eliquis for anticoagulation  #4 hyperlipidemia-continue statin.  #5GERD-Protonix  #6 urinary retention and acute-Foley was placed in the emergency room, discontinue Foley catheter and monitor. Patient already on Proscar. Flomax was discontinued last admission due to his hypertension. Blood pressure is stable    All the records are reviewed and case discussed with Care Management/Social Workerr. Management plans discussed with the patient, family and they are in agreement.  CODE STATUS: Full Code  TOTAL TIME TAKING CARE OF THIS PATIENT: 37 minutes.   POSSIBLE D/C today or tomorrow, DEPENDING ON CLINICAL CONDITION.   Goldy Calandra M.D on 03/16/2017 at 9:26 AM  Between 7am to 6pm - Pager - [phone]  After 6pm go to [email or web address] - password [email or web address] [facility]  Office  [phone]  CC: Primary care physician; [name], MD

## 2017-03-16 NOTE — Care Management (Signed)
After much back and forth, patient and his wife have decline the portable oxygen cannisters.  State they have portable oxygen that they use when they go out and go on trips.  discussed that patient has stated "I do not have portable oxygen: but now informed this statement is inaccurate.  Patient has a portable concentrator but wants "another device."  Patient will have in home assessment for portable oxygen needs.

## 2017-03-16 NOTE — Progress Notes (Signed)
   03/16/17 1015 03/16/17 1017 03/16/17 1019  Vitals  Temp 98.2 F (36.8 C) --  --   Temp Source Oral --  --   BP 133/62 (!) 103/58 (!) 90/41  MAP (mmHg) 78 67 (!) 51  BP Location Right Arm --  --   BP Method Automatic Automatic Automatic  Patient Position (if appropriate) Lying Sitting Standing  Pulse Rate 75 --  79

## 2017-03-16 NOTE — Progress Notes (Addendum)
Per CNA, Nallely, HS CBG was 173

## 2017-03-16 NOTE — Consult Note (Signed)
   [facility] CM Inpatient Consult   03/16/2017  Briyan Kleven Sr. 01/22/1936 [id number]    Patient is currently active with [facility] for chronic disease management services.  Patient has been engaged by a [employer] and CSW.  Our community based plan of care has focused on disease management and community resource support.  Patient will receive a post discharge transition of care call and will be evaluated for monthly home visits for assessments and disease process education.  Made Inpatient Case Manager aware that [facility] following. Of note, [facility] Care Management services does not replace or interfere with any services that are needed or arranged by inpatient case management or social work.  For additional questions or referrals please contact:  Lennox Dolberry RN, [facility] Liaison  [phone]) [place] [phone]) Toll free office

## 2017-03-16 NOTE — Evaluation (Signed)
Physical Therapy Evaluation Patient Details Name: Marco Byrnes Sr. MRN: [id number] DOB: Jun 25, 1936 Today's Date: 03/16/2017   History of Present Illness  Pt is a 81 yo M, admitted to acute care on 7/19 due to near syncopal episode. Prior to admission, pt required minA with cooking, but able to perform hygiene and mobility with modI, using RW as AD. PMH includes: AAA repair, anemia, anginal pain, A-fib, basal cell carcinoma, BPH, carotid ant occlusion, chronic systolic heart failure, CKD, DVT, GERD, HLD, HTN, heart disease, MI, orthopenia, OH, PVC, sleep apnea, and type II diabetes mellitus.   Clinical Impression  Pt is pleasant and willing to participate; eager for return to PLOF. Pt performs bed mobility with min physical assist, with min verbal/visual cues for mechanics and positioning. Pt with dizziness upon sitting and dramatic reduction in BP, resulting in immediate return to supine position. Tranfers and ambulation not assessed, as pt presented with severe orthostatic hypotension upon sitting. Treatment consisted of supine there-ex, with 2 L supplemental O2. BP in supine: 106/47; sitting: 70/36; and supine at end of session: 102/49. Nursing notified of drop in BP with sitting. Pt maintained O2 sat at 97-98% throughout duration of session, on 2 L supplemental O2. Pt presents with the following deficits: strength, endurance, power, and sequencing. Pt would benefit from skilled PT to address the previously mentioned impairments and promote return to PLOF. Currently recommending SNF, pending d/c.      Follow Up Recommendations SNF    Equipment Recommendations  None recommended by PT    Recommendations for Other Services       Precautions / Restrictions Precautions Precautions: Fall Restrictions Weight Bearing Restrictions: No      Mobility  Bed Mobility Overal bed mobility: Needs Assistance Bed Mobility: Supine to Sit     Supine to sit: Min assist     General bed  mobility comments: MinA with bed mobility to the L, requiring verbal and visual cues for correct mechanics, hand placement, and safety awareness. Pt with slight dizziness upon sitting at EOB; BP read at 70/36. SPT had pt return to supine position with HOB elevated adn pt BP returned to 102/49, with reduced sx of dizziness.   Transfers                 General transfer comment: (P) Not assessed, due to orthostatic hypotension upon supine to sit.   Ambulation/Gait             General Gait Details: (P) Not assessed, due to orthostatic hypotension upon supine to sit.   Stairs            Wheelchair Mobility    Modified Rankin (Stroke Patients Only)       Balance Overall balance assessment: Needs assistance Sitting-balance support: Bilateral upper extremity supported;Feet supported Sitting balance-Leahy Scale: (P) Fair Sitting balance - Comments: (P) Min guard with sitting balance, due to reports of dizziness. Required verbal cues to obtain safe sitting position to optimize balance.                                      Pertinent Vitals/Pain Pain Assessment: No/denies pain    Home Living Family/patient expects to be discharged to:: Skilled nursing facility Living Arrangements: Spouse/significant other Available Help at Discharge: Family;Available 24 hours/day Type of Home: House Home Access: Stairs to enter Entrance Stairs-Rails: Left;Right;Can reach both Entrance Stairs-Number of Steps: 4 (4 in  front and 6 on side. Primarily uses side entrance) Home Layout: One level Home Equipment: Walker - 2 wheels;Cane - single point Additional Comments: Primarily utilizes RW for mobility. Reports using SPC when navigating stairs. Uses 2L supplemental O2 24/7 at home, prior to admission     Prior Function Level of Independence: Needs assistance         Comments: Uses 2 L O2 at home; wife prepares meals and assists pt prn. Pt modI with amb and hygiene.       Hand Dominance        Extremity/Trunk Assessment   Upper Extremity Assessment Upper Extremity Assessment: Overall WFL for tasks assessed    Lower Extremity Assessment Lower Extremity Assessment: Generalized weakness (MMT to B LE's grossly 4-/5)       Communication   Communication: No difficulties  Cognition Arousal/Alertness: Awake/alert Behavior During Therapy: WFL for tasks assessed/performed Overall Cognitive Status: Within Functional Limits for tasks assessed                                 General Comments: Slightly lethargic, but willing to participate.       General Comments      Exercises Other Exercises Other Exercises: Supine therex performed to B LE's with supervision x 10 reps: ankle pumps, quad sets, glute sets, SLR, and hip abd. Pt with good mechancis and safety, requiring no verbal cues. BP maintained at 106/47 and O2 sat maintained at 98% with 2 L supplemental O2.     Assessment/Plan    PT Assessment Patient needs continued PT services  PT Problem List Decreased strength;Decreased activity tolerance;Decreased balance;Decreased mobility;Decreased coordination;Decreased safety awareness       PT Treatment Interventions DME instruction;Gait training;Stair training;Functional mobility training;Therapeutic activities;Therapeutic exercise;Balance training;Neuromuscular re-education;Patient/family education    PT Goals (Current goals can be found in the Care Plan section)  Acute Rehab PT Goals Patient Stated Goal: to go home  PT Goal Formulation: With patient Time For Goal Achievement: 03/30/17 Potential to Achieve Goals: Good Additional Goals Additional Goal #1: Pt to perform 15 SLR to B LE's with min fatigue for B LE strengthening and return to PLOF.     Frequency Min 2X/week   Barriers to discharge        Co-evaluation               AM-PAC PT "6 Clicks" Daily Activity  Outcome Measure Difficulty turning over in bed  (including adjusting bedclothes, sheets and blankets)?: Total Difficulty moving from lying on back to sitting on the side of the bed? : Total Difficulty sitting down on and standing up from a chair with arms (e.g., wheelchair, bedside commode, etc,.)?: Total Help needed moving to and from a bed to chair (including a wheelchair)?: A Lot Help needed walking in hospital room?: A Lot Help needed climbing 3-5 steps with a railing? : Total 6 Click Score: 8    End of Session Equipment Utilized During Treatment: Oxygen Activity Tolerance: Other (comment) (Limited by impaired BP) Patient left: in bed;with call bell/phone within reach;with bed alarm set Nurse Communication: Mobility status (Seated BP readings. ) PT Visit Diagnosis: Unsteadiness on feet (R26.81);Other abnormalities of gait and mobility (R26.89);Muscle weakness (generalized) (M62.81)    Time: 1779-3903 PT Time Calculation (min) (ACUTE ONLY): 32 min   Charges:         PT G Codes:        Oran Rein PT, SPT  Bevelyn Ngo 03/16/2017, 5:06 PM

## 2017-03-16 NOTE — Clinical Social Work Note (Signed)
CSW met with patient and his wife to discuss going to SNF per PT recommendations.  Patient was just recently discharge from [facility], and he had 4 days remaining from his 20 days.  Patient expressed he does not want to return to a SNF again, mainly because, he will be in copay days after his 4 days have been used.  CSW explained to patient's wife, how long it takes in order for his 20 days to begin again.  Patient's wife expressed interest in an ALF for patient, CSW gave her information about local ALFs and group homes.  Patient's wife is in agreement to have patient return back home with home health.  CSW asked case manager to see if she can set up a home health social worker in case patient and family need assistance with ALF placement or applying for Medicaid.  CSW to sign off please reconsult if other social work needs arise.  Jones Broom. [place], MSW, [place]  03/16/2017 6:25 PM

## 2017-03-16 NOTE — Progress Notes (Signed)
Foley catheter removed. Patient tolerated removal. No trauma or bleeding noted.

## 2017-03-16 NOTE — Care Management (Signed)
Patient's oxygen order is indeed for continuous.  Manuela Schwartz with [facility] has provided patient with two portable oxygen tanks and then she will have [facility] make a home visit  to determine if patient will qualify for a portable home concentrator.  discussed with patient that he does indeed need to travel with portable oxygen and for now to travel with the portable tanks.

## 2017-03-16 NOTE — Care Management Obs Status (Signed)
[facility] NOTIFICATION   Patient Details  Name: Marco Smallman Sr. MRN: [id number] Date of Birth: 1935/09/09   Medicare Observation Status Notification Given:  Yes    Katrina Stack, RN 03/16/2017, 10:12 AM

## 2017-03-16 NOTE — Care Management (Signed)
Spoke with patient's wife and she relays patient does have a Marine scientist and that patient "is telling it all wrong."

## 2017-03-16 NOTE — Care Management (Signed)
order for CM assessment for medication needs and patient does not have portable oxygen tank. Placed in observation for syncope event while in physician office.  He is followed by [facility] and PT service and agency informed of observation.  Patient says his oxygen is provided by [facility].  Patient says his portable tank plugs into the cigarette lighter - but no portable tanks that can be use unless it is plugged in to the car cigarette lighter.  Have placed call to [facility] to discuss current oxygen order.  Actual order is for continuous.  Marco [name] with [facility] will discuss with patient and provide portable tank for transport home

## 2017-03-17 DIAGNOSIS — N189 Chronic kidney disease, unspecified: Secondary | ICD-10-CM | POA: Diagnosis not present

## 2017-03-17 DIAGNOSIS — I10 Essential (primary) hypertension: Secondary | ICD-10-CM | POA: Diagnosis not present

## 2017-03-17 DIAGNOSIS — I4891 Unspecified atrial fibrillation: Secondary | ICD-10-CM | POA: Diagnosis not present

## 2017-03-17 DIAGNOSIS — I951 Orthostatic hypotension: Secondary | ICD-10-CM | POA: Diagnosis not present

## 2017-03-17 DIAGNOSIS — I4892 Unspecified atrial flutter: Secondary | ICD-10-CM | POA: Diagnosis not present

## 2017-03-17 DIAGNOSIS — I25118 Atherosclerotic heart disease of native coronary artery with other forms of angina pectoris: Secondary | ICD-10-CM | POA: Diagnosis not present

## 2017-03-17 DIAGNOSIS — R531 Weakness: Secondary | ICD-10-CM | POA: Diagnosis not present

## 2017-03-17 DIAGNOSIS — I5042 Chronic combined systolic (congestive) and diastolic (congestive) heart failure: Secondary | ICD-10-CM | POA: Diagnosis not present

## 2017-03-17 DIAGNOSIS — R55 Syncope and collapse: Secondary | ICD-10-CM | POA: Diagnosis not present

## 2017-03-17 DIAGNOSIS — I2510 Atherosclerotic heart disease of native coronary artery without angina pectoris: Secondary | ICD-10-CM | POA: Diagnosis not present

## 2017-03-17 DIAGNOSIS — R0902 Hypoxemia: Secondary | ICD-10-CM | POA: Diagnosis not present

## 2017-03-17 LAB — BASIC METABOLIC PANEL
Anion gap: 8 (ref 5–15)
BUN: 43 mg/dL — AB (ref 6–20)
CHLORIDE: 94 mmol/L — AB (ref 101–111)
CO2: 35 mmol/L — AB (ref 22–32)
CREATININE: 2.74 mg/dL — AB (ref 0.61–1.24)
Calcium: 9.3 mg/dL (ref 8.9–10.3)
GFR calc Af Amer: 24 mL/min — ABNORMAL LOW (ref >60)
GFR calc non Af Amer: 21 mL/min — ABNORMAL LOW (ref >60)
Glucose, Bld: 160 mg/dL — ABNORMAL HIGH (ref 65–99)
POTASSIUM: 3.5 mmol/L (ref 3.5–5.1)
Sodium: 137 mmol/L (ref 135–145)

## 2017-03-17 LAB — GLUCOSE, CAPILLARY
GLUCOSE-CAPILLARY: 163 mg/dL — AB (ref 65–99)
GLUCOSE-CAPILLARY: 148 mg/dL — AB (ref 65–99)
Glucose-Capillary: 149 mg/dL — ABNORMAL HIGH (ref 65–99)
Glucose-Capillary: 186 mg/dL — ABNORMAL HIGH (ref 65–99)

## 2017-03-17 MED ORDER — MIDODRINE HCL 5 MG PO TABS
15.0000 mg | ORAL_TABLET | Freq: Three times a day (TID) | ORAL | Status: DC
  Administered 2017-03-17 – 2017-03-20 (×8): 15 mg via ORAL
  Filled 2017-03-17 (×8): qty 3

## 2017-03-17 NOTE — Consult Note (Addendum)
Cardiology Consult    Patient ID: Marco [name] [facility] Sr. MRN: [id number], DOB/AGE: February 14, 1936   Admit date: 03/15/2017 Date of Consult: 03/17/2017  Primary Physician: [name], MD Primary Cardiologist: Johnny Bridge, MD / S. [name], MD  Requesting Provider: Assunta Gambles  Patient Profile    Marco Henegar Sr. is a 81 y.o. male with a history of CAD s/p CABG (2006), mixed ICM/NICM, chronic systolic CHF (EF prev as low as 25-30%  40-45% by echo 01/2017), MR s/p MVR (2006), Carotid dzs s/p R CEA, Afib/flutter s/p DCCV in 2014 and on chronic amio/eliquis, SSS s/p MDT PPM, AAA s/p repair (2008), CKD IV, HTN, HL, obesity, and COPD on supplemental O2, who Presents to the hospital with weakness, hypoxia, orthostasis   History of Present Illness    HPI: Marco [name] was at a tree 03/15/2017, noted to have hypoxia with levels of 87%. He was placed on 2 L which is what he takes chronically He reported feeling very weak, unable to ambulate, unable to go home  He was placed in the hospital given his weakness, worsening lab work Creatinine was worse, baseline typically 2.6, on arrival was 3.1 Noted to be orthostatic in the hospital even on today's visit systolic pressure 983 going down to 88 with standing  He's been taking torsemide, low-dose losartan every other day As an outpatient was not taking his midodrine  very sedentary and sits most of the day. ambulates with a walker. He has not fallen.    Prior hospital admissions for orthostatic hypotension  ECG was A paced.   Troponins were nl.    Florinef held as an outpatient given history of heart failure  He is taking torsemide when necessary 20 mg once daily when weight is less than 248.  40 mg (2 tablets) once daily when weight is 248 or higher.  Previous studies reviewed  Echo showed improvement in LV fxn - 40-45%.   Carotid U/S showed stable L>R ICA dzs.    He denies any recent chest pain, change in chronic  dyspnea, orthopnea, pnd, edema, or early satiety.     Past Medical History   Past Medical History:  Diagnosis Date  . AAA (abdominal aortic aneurysm)repaired    a. 12/2006 s/p repair of 6cm infrarenal AAA with 37mm Dacron graft.  . Anemia   . Anginal pain ([place])   . Atrial fibrillation/Flutter    a. Dx 2014;  b. 10/2012 s/p TEE/DCCV;  c. On amio and Eliquis (CHA2DS2VASc = 6).  . Basal cell carcinoma    a. 2013 x 2 back and left neck  . BPH (benign prostatic hypertrophy)   . Carotid artery occlusion    a. 11/2008 s/p R CEA; b. 01/2017 Carotid U/S: RICA <38%, LICA >25%.  . Chronic systolic heart failure ([place])    a. 02/2012 Echo: EF 30-35%, Pulm HTN, and modest RV dysfunction;  b. 10/2012 TEE: EF 25-30%, mild conc LVH, diff HK, mod RV dysfxn, mod dil RA/LA, mild MR, no MS, nl MVR, mod TR, Ao sclerosis; c. 01/2017 Echo: EF 45-50%, diff HK, triv MR, mod dil LA.  . CKD (chronic kidney disease), stage IV ([place])   . Coronary artery disease    a. 1999 PCI RCA;  b. 01/2005 NSTEMI: CABG x 3 (LIMA->LAD, VG->OM, VG->LPL) @ time of MVR.  [name] DVT (deep venous thrombosis) ([place])   . GERD (gastroesophageal reflux disease)   . History of hiatal hernia   . History of shingles   .  Hyperlipidemia   . Hypertensive heart disease   . Mitral regurgitation--s/p repair    a. 01/2005 MVR: 28-mm Edwards ET Logix ring annuloplasty; b. 10/2012 TEE: normal fxning MV ring with mild MR, no MS; 01/2017 Echo: Triv MR.  . Mixed Ischemic and Non-ischemic Cardiomyopathy    a. 02/2012 Echo: EF 30-35%, Pulm HTN, and modest RV dysfunction;  b. 10/2012 TEE: EF 25-30%, mild conc LVH, diff HK, mod RV dysfxn, mod dil RA/LA, mild MR, no MS, nl MVR, mod TR, Ao sclerosis; c. 01/2017 Echo: EF 45-50%.  . Myocardial infarction ([place])    2006  . Obesity   . Orthopnea   . Orthostatic hypotension    a. 11/2016 -> meds reduced;  b. 01/2017 admitted w/ presyncope and orthostasis->Meds held.  . Oxygen deficit    a. Wears 2 lpm most of the day and @ HS.    . Presence of permanent cardiac pacemaker   . PVC (premature ventricular contraction)    a. 02/2009 s/p RFCA ([place]).  . Sick sinus syndrome ([place])    a. 01/2005 s/p PPM 2/2 heart block following CABG;  b. 05/2012 Gen Change: MDT Adapta ADDRL1 DC PPM, ser # [id number].  . Sleep apnea    a. Intolerant of CPAP  . Type II diabetes mellitus ([place])     Past Surgical History:  Procedure Laterality Date  . ABDOMINAL AORTIC ANEURYSM REPAIR      May 2008,   . CARDIAC CATHETERIZATION  2006   @ [facility]  . CATARACT EXTRACTION     right  . CATARACT EXTRACTION W/PHACO Left 10/04/2016   Procedure: CATARACT EXTRACTION PHACO AND INTRAOCULAR LENS PLACEMENT (IOC);  Surgeon: Estill Cotta, MD;  Location: [facility] ORS;  Service: Ophthalmology;  Laterality: Left;  [place] 01:21 AP% 25.9 CDE 39.08 Fluid pack lot # [id number]   . CORONARY ARTERY BYPASS GRAFT     CABG with a  LIMA to the LAD, SVG to circumflex, SVG to posterolateral [id number].  Randolm Idol / REPLACE / REMOVE PACEMAKER  2006   Dr. [name]  . MITRAL VALVE REPAIR  2006    mitral valve repair with a #28 Edwards logic ring  . PACEMAKER PLACEMENT    . PERMANENT PACEMAKER GENERATOR CHANGE N/A 06/26/2012   Procedure: PERMANENT PACEMAKER GENERATOR CHANGE;  Surgeon: Deboraha Sprang, MD;  Location: [facility] CATH LAB;  Service: Cardiovascular;  Laterality: N/A;  . SKIN BIOPSY     left wrist; right elbow  . SKIN CANCER EXCISION      x 6  . SKIN CANCER EXCISION    . TRANSESOPHAGEAL ECHOCARDIOGRAM    . VASECTOMY       Allergies  Allergies  Allergen Reactions  . Shellfish Allergy Anaphylaxis    Inpatient Medications    . amiodarone  200 mg Oral Daily  . apixaban  5 mg Oral BID  . calcitRIOL  0.5 mcg Oral Q breakfast  . cholecalciferol  1,000 Units Oral Daily  . docusate sodium  100 mg Oral BID  . finasteride  5 mg Oral Daily  . insulin aspart  0-5 Units Subcutaneous QHS  . insulin aspart  0-9 Units Subcutaneous TID WC  . midodrine  10 mg Oral TID WC  .  pantoprazole  40 mg Oral QAC breakfast  . potassium chloride  20 mEq Oral Daily  . rosuvastatin  10 mg Oral QHS  . tamsulosin  0.4 mg Oral Daily    Family History    Family History  Problem Relation  Age of Onset  . Heart attack Mother 93  . Diabetes Sister   . Hypertension Sister   . Heart attack Sister   . Heart attack Brother 26       3 Brothers deceased from [place] in 85's or 3's  . Heart disease Brother        Heart Disease before age 15  . Heart attack Father 93  . Diabetes Father        Amputation: bilateral feet  . Heart disease Father   . Heart attack Sister   . Heart attack Sister   . Heart attack Brother 33  . Heart attack Brother 7  . Diabetes Son     Social History    Social History   Social History  . Marital status: Married    Spouse name: N/A  . Number of children: N/A  . Years of education: N/A   Occupational History  . Retired     Engineer, structural in [place]  .  Retired   Social History Main Topics  . Smoking status: Former Smoker    Packs/day: 1.50    Years: 34.00    Types: Cigarettes    Quit date: 08/28/1987  . Smokeless tobacco: Never Used  . Alcohol use 0.6 oz/week    1 Cans of beer per week     Comment: Occasionally but none since his surgery in June  . Drug use: No  . Sexual activity: No   Other Topics Concern  . Not on file   Social History Narrative   Lives locally with wife.  Sedentary - sits most of the day.  Ambulates some around his home - uses a walker.     Review of Systems    General:  No chills, fever, night sweats or weight changes. Chronic weakness Cardiovascular:  No chest pain, +++ chronic dyspnea on exertion, no edema, orthopnea, palpitations, paroxysmal nocturnal dyspnea.  +++ presyncope and orthostasis. Dermatological: No rash, lesions/masses Respiratory: No cough, +++ chronic dyspnea Urologic: +++ hematuria on admission.  No dysuria Abdominal:   No nausea, vomiting, diarrhea, bright red blood per rectum,  melena, or hematemesis Neurologic:  No visual changes, wkns, changes in mental status. All other systems reviewed and are otherwise negative except as noted above.  Physical Exam    Blood pressure (!) 116/49, pulse 80, temperature 97.7 F (36.5 C), temperature source Oral, resp. rate 18, height 6' (1.829 m), weight 240 lb 14.4 oz (109.3 kg), SpO2 99 %.  Orthostatic VS for the past 24 hrs:  BP- Lying Pulse- Lying BP- Sitting Pulse- Sitting BP- Standing at 0 minutes Pulse- Standing at 0 minutes  03/17/17 1237 134/66 78 113/49 84 (!) 88/45 79    General: Pleasant, NAD, morbidly obese Psych: Normal affect. Neuro: Alert and oriented X 3. Moves all extremities spontaneously. HEENT: Normal  Neck: Supple without bruits.  JVP ~ 12 cm. Lungs:  Resp regular and unlabored, bibasilar crackles. Heart: RRR no s3, s4, or murmurs. Abdomen: Soft, obese, non-tender, non-distended, BS + x 4.  Extremities: No clubbing, cyanosis or edema. DP/PT/Radials 1+ and equal bilaterally.  Labs     Lab Results  Component Value Date   WBC 7.5 03/16/2017   HGB 11.5 (L) 03/16/2017   HCT 34.5 (L) 03/16/2017   MCV 93.0 03/16/2017   PLT 150 03/16/2017     Recent Labs Lab 03/17/17 0430  NA 137  K 3.5  CL 94*  CO2 35*  BUN 43*  CREATININE 2.74*  CALCIUM 9.3  GLUCOSE 160*   Lab Results  Component Value Date   CHOL 114 02/02/2017   HDL 37 (L) 02/02/2017   LDLCALC 66 02/02/2017   TRIG 56 02/02/2017    Radiology Studies    Dg Chest 2 View  Result Date: 03/15/2017 CLINICAL DATA:  Weakness and near syncope. EXAM: CHEST  2 VIEW COMPARISON:  02/04/2017 FINDINGS: AP and lateral views of the chest show enlargement cardiopericardial silhouette, stable. There is bibasilar parenchymal and pleural scarring with small probably chronic diffusion. Lungs appear hyperexpanded. Left-sided delete permanent pacemaker again noted. The patient is status post cardiac valve replacement. IMPRESSION: 1. Stable exam. 2.  Cardiomegaly with hyperexpansion chronic interstitial opacity. 3. Bibasilar scarring stable appearance of tiny effusions. Electronically Signed   By: Misty Stanley M.D.   On: 03/15/2017 12:38   Ct Head Wo Contrast  Result Date: 03/08/2017 CLINICAL DATA:  Patient slipped and fell out of recliner. No reported head trauma or loss of consciousness. EXAM: CT HEAD WITHOUT CONTRAST TECHNIQUE: Contiguous axial images were obtained from the base of the skull through the vertex without intravenous contrast. COMPARISON:  CT head 05/01/2013. FINDINGS: Brain: No evidence for acute infarction, hemorrhage, mass lesion, hydrocephalus, or extra-axial fluid. There is moderately advanced generalized atrophy. Hypoattenuation of white matter representing small vessel disease. Old RIGHT basal ganglia infarct. Vascular: Calcification of the cavernous internal carotid arteries consistent with cerebrovascular atherosclerotic disease. No signs of intracranial large vessel occlusion. Skull: Normal. Negative for fracture or focal lesion. Sinuses/Orbits: BILATERAL cataract extraction. No layering sinus fluid. Other: Compared with priors, atrophy has progressed. IMPRESSION: Atrophy and small vessel disease. No acute intracranial abnormality. No skull fracture or intracranial hemorrhage. Cerebrovascular atherosclerosis. Electronically Signed   By: Staci Righter M.D.   On: 03/08/2017 11:07    ECG & Cardiac Imaging    ECG: A paced, V sensed, 81, LBBB. _____________   2D Echocardiogram 6.8.2018 - Left ventricle: Poor image quality even with definity. The cavity size was normal. Wall thickness was increased in a pattern of moderate LVH. Systolic function was mildly reduced. The estimated  ejection fraction was in the range of 45% to 50%. Diffuse hypokinesis. The study is not technically sufficient to allow   evaluation of LV diastolic function. - Mitral valve: Post repair with annuloplasty ring trivial residual - Left atrium: The  atrium was moderately dilated.   Assessment & Plan    1.  Orthostatic hypotension/Presyncope:   h/o orthostasis  Would hold all antihypertensives Would continue midodrine 15 mg 3 times a day Hold torsemide for a few days  Previously was taking torsemide 20 mg for weight 248 pounds or less Torsemide 40 mg for weight 248 pounds or more We will likely need to change this regimen, only take torsemide 20 ng for weight 255 pounds her last He reports his wife does all of the medications Would likely benefit from abdominal binder Compression hose for leg swelling  2.  CAD:  S/p CABG.    Cont statin. No ASA in the setting of chronic eliquis. Holding all other medications including beta blockers  3.  Chronic systolic CHF/Mixed ICM & NICM:  EF 40-45% by echo  we will need to find the balance between euvolemia and orthostasis.   Suspect he will need torsemide every other day not daily  4.  Tachybrady syndrome:  s/p PPM.  No recent palpitations.    5.  CKD IV:  Creat worse, now trending down Suspect mild prerenal state  7.  Carotid Arterial Dzs:  >25 LICA, > 50 RICA stenoses.  Stable compared to 09/2016 study.    8.  Afib/Flutter: A paced.   Remains on amio/eliquis.  Recent abnl pft w/ reduced DLCO.   High Res CT w/o pulmonary fibrosis.    9.  MR s/p MR Repair:  Triv MR by echo this admission.   DISPO Tremendously deconditioned, wife unable to take care of him at home This will lead to recurrent hospital admissions Will discuss with family, they will need home health   Total encounter time more than 110 minutes  Greater than 50% was spent in counseling and coordination of care with the patient   Signed, Ida Rogue, NP 03/17/2017, 3:14 PM

## 2017-03-17 NOTE — Progress Notes (Signed)
[place] [facility] [place] NAME: Marco [name]    MR#:  [id number]  DATE OF BIRTH:  Sep 26, 1935  SUBJECTIVE:  CHIEF COMPLAINT:   Chief Complaint  Patient presents with  . Fall  . Weakness  . Hypotension   - admitted with a near-syncopal episode yesterday. Feels much stronger today. -Orthostatics are still showing significant drop in BP on standing.  REVIEW OF SYSTEMS:  Review of Systems  Constitutional: Positive for malaise/fatigue. Negative for chills and fever.  HENT: Negative for congestion, ear discharge, hearing loss and nosebleeds.   Eyes: Negative for blurred vision and double vision.  Respiratory: Negative for cough, shortness of breath and wheezing.   Cardiovascular: Negative for chest pain, palpitations and leg swelling.  Gastrointestinal: Negative for abdominal pain, constipation, diarrhea, nausea and vomiting.  Genitourinary: Negative for dysuria.  Musculoskeletal: Negative for myalgias.  Neurological: Positive for dizziness. Negative for speech change, focal weakness, seizures and headaches.    DRUG ALLERGIES:   Allergies  Allergen Reactions  . Shellfish Allergy Anaphylaxis    VITALS:  Blood pressure (!) 116/49, pulse 80, temperature 97.7 F (36.5 C), temperature source Oral, resp. rate 18, height 6' (1.829 m), weight 109.3 kg (240 lb 14.4 oz), SpO2 99 %.  PHYSICAL EXAMINATION:  Physical Exam  GENERAL:  81 y.o.-year-old patient lying in the bed with no acute distress.  EYES: Pupils equal, round, reactive to light and accommodation. No scleral icterus. Extraocular muscles intact.  HEENT: Head atraumatic, normocephalic. Oropharynx and nasopharynx clear.  NECK:  Supple, no jugular venous distention. No thyroid enlargement, no tenderness.  LUNGS: Normal breath sounds bilaterally, no wheezing, rales,rhonchi or crepitation. No use of accessory muscles of respiration.  CARDIOVASCULAR: S1, S2 normal. No rubs, or gallops.  2/6 systolic murmur present, has a pacemaker in place ABDOMEN: Soft, nontender, nondistended. Bowel sounds present. No organomegaly or mass.  EXTREMITIES: No pedal edema, cyanosis, or clubbing.  NEUROLOGIC: Cranial nerves II through XII are intact. Muscle strength 5/5 in all extremities. Sensation intact. Gait not checked.  PSYCHIATRIC: The patient is alert and oriented x 3.  SKIN: No obvious rash, lesion, or ulcer.    LABORATORY PANEL:   CBC  Recent Labs Lab 03/16/17 0614  WBC 7.5  HGB 11.5*  HCT 34.5*  PLT 150   ------------------------------------------------------------------------------------------------------------------  Chemistries   Recent Labs Lab 03/16/17 0614 03/17/17 0430  NA 139 137  K 3.3* 3.5  CL 97* 94*  CO2 35* 35*  GLUCOSE 190* 160*  BUN 41* 43*  CREATININE 2.80* 2.74*  CALCIUM 9.2 9.3  MG 2.2  --    ------------------------------------------------------------------------------------------------------------------  Cardiac Enzymes  Recent Labs Lab 03/15/17 1147  TROPONINI <0.03   ------------------------------------------------------------------------------------------------------------------  RADIOLOGY:  No results found.  EKG:   Orders placed or performed during the hospital encounter of 03/15/17  . ED EKG  . ED EKG  . EKG 12-Lead  . EKG 12-Lead  . ED EKG  . ED EKG    ASSESSMENT AND PLAN:    #1 Near syncope- was admitted last month for the same. Noted to have significant orthostases at that time and his BP meds were discontinued at discharge and also was started on midodrine and florinef. - currently on low dose losartan and torsemide at home again. don't see midodrine or florinef ordered - so check orthostatics    Still dropping.- Consulted cardio- hold all antihypertensive meds for now, resume midodrine.   No florinef due to fluid retention.  -  also carotid dopplers with >70% left carotid stenosis-the following with the  vascular surgeon for that. Discussed with on-call vascular surgeon who recommended outpatient follow-up for an angiogram if needed because patient is symptomatic. Patient cannot get a CT angiogram due to his CK D and cannot get an MRA due to his pacemaker. - PT consulted  #2 Chronic combined systolic and diastolic- stable, decrease the dose of torsemide. On low dose losartan.Echo with EF of 45-50%. -Follows with cardiology as outpatient. Also has a pacemaker.  #3 atrial fibrillation with sick sinus syndrome-status post pacemaker. Also on amiodarone. -On eliquis for anticoagulation  #4 hyperlipidemia-continue statin.  #5GERD-Protonix  #6 urinary retention and acute-Foley was placed in the emergency room, discontinue Foley catheter and monitor. Patient already on Proscar. Flomax was discontinued last admission due to his hypertension. Blood pressure is stable   All the records are reviewed and case discussed with Care Management/Social Workerr. Management plans discussed with the patient, family and they are in agreement.  CODE STATUS: Full Code  TOTAL TIME TAKING CARE OF THIS PATIENT: 35 minutes.   POSSIBLE D/C tomorrow, DEPENDING ON CLINICAL CONDITION.   Vaughan Basta M.D on 03/17/2017 at 4:21 PM  Between 7am to 6pm - Pager - [phone]  After 6pm go to [email or web address] - password [email or web address] [facility]  Office  [phone]  CC: Primary care physician; [name], MD

## 2017-03-18 DIAGNOSIS — I5042 Chronic combined systolic (congestive) and diastolic (congestive) heart failure: Secondary | ICD-10-CM | POA: Diagnosis not present

## 2017-03-18 DIAGNOSIS — I2510 Atherosclerotic heart disease of native coronary artery without angina pectoris: Secondary | ICD-10-CM | POA: Diagnosis not present

## 2017-03-18 DIAGNOSIS — I951 Orthostatic hypotension: Secondary | ICD-10-CM | POA: Diagnosis not present

## 2017-03-18 DIAGNOSIS — N189 Chronic kidney disease, unspecified: Secondary | ICD-10-CM

## 2017-03-18 DIAGNOSIS — R531 Weakness: Secondary | ICD-10-CM | POA: Diagnosis not present

## 2017-03-18 DIAGNOSIS — I25118 Atherosclerotic heart disease of native coronary artery with other forms of angina pectoris: Secondary | ICD-10-CM | POA: Diagnosis not present

## 2017-03-18 DIAGNOSIS — I4891 Unspecified atrial fibrillation: Secondary | ICD-10-CM | POA: Diagnosis not present

## 2017-03-18 DIAGNOSIS — R55 Syncope and collapse: Secondary | ICD-10-CM | POA: Diagnosis not present

## 2017-03-18 DIAGNOSIS — R0902 Hypoxemia: Secondary | ICD-10-CM | POA: Diagnosis not present

## 2017-03-18 LAB — GLUCOSE, CAPILLARY
GLUCOSE-CAPILLARY: 142 mg/dL — AB (ref 65–99)
GLUCOSE-CAPILLARY: 153 mg/dL — AB (ref 65–99)
Glucose-Capillary: 137 mg/dL — ABNORMAL HIGH (ref 65–99)
Glucose-Capillary: 171 mg/dL — ABNORMAL HIGH (ref 65–99)

## 2017-03-18 MED ORDER — PSEUDOEPHEDRINE HCL 30 MG PO TABS
30.0000 mg | ORAL_TABLET | Freq: Three times a day (TID) | ORAL | Status: DC
  Filled 2017-03-18: qty 1

## 2017-03-18 MED ORDER — PSEUDOEPHEDRINE HCL 30 MG PO TABS
30.0000 mg | ORAL_TABLET | Freq: Four times a day (QID) | ORAL | Status: DC
  Administered 2017-03-18 – 2017-03-19 (×3): 30 mg via ORAL
  Filled 2017-03-18 (×5): qty 1

## 2017-03-18 MED ORDER — FINASTERIDE 5 MG PO TABS
5.0000 mg | ORAL_TABLET | Freq: Every day | ORAL | Status: DC
  Administered 2017-03-19 – 2017-03-20 (×2): 5 mg via ORAL
  Filled 2017-03-18 (×2): qty 1

## 2017-03-18 NOTE — Progress Notes (Addendum)
Progress Note  Patient Name: Karnell Vanderloop [facility] Date of Encounter: 03/18/2017  Primary Cardiologist: Rockey Situ  Subjective   No significant complaints this morning, Did not receive midodrine on time, pharmacy error Was scheduled to receive 15 mg at 8 AM.  At 11:30 still has not received his medication Repeat orthostatics after medication show supine pressure 150s down to 99 with sitting and standing, no significant change with standing or after 3 minutes.  Legs are weak, reports he can do PT at home build himself back up Appetite is good Wife was not available for discussion yesterday afternoon or this morning  Notes indicating he does not want SNF as he only has 4 days left before he has to pay out of pocket  Inpatient Medications    Scheduled Meds: . amiodarone  200 mg Oral Daily  . apixaban  5 mg Oral BID  . calcitRIOL  0.5 mcg Oral Q breakfast  . cholecalciferol  1,000 Units Oral Daily  . docusate sodium  100 mg Oral BID  . finasteride  5 mg Oral Daily  . insulin aspart  0-5 Units Subcutaneous QHS  . insulin aspart  0-9 Units Subcutaneous TID WC  . midodrine  15 mg Oral TID WC  . pantoprazole  40 mg Oral QAC breakfast  . potassium chloride  20 mEq Oral Daily  . rosuvastatin  10 mg Oral QHS  . tamsulosin  0.4 mg Oral Daily   Continuous Infusions:  PRN Meds: acetaminophen **OR** acetaminophen, neomycin-bacitracin-polymyxin, ondansetron **OR** ondansetron (ZOFRAN) IV, polyethylene glycol, zolpidem   Vital Signs    Vitals:   03/18/17 0800 03/18/17 1107 03/18/17 1351 03/18/17 1400  BP: (!) 125/55 (!) 101/39 (!) 154/60 98/62  Pulse: 74 83 75 80  Resp: 16 20 20    Temp: 97.9 F (36.6 C) (!) 97.4 F (36.3 C) (!) 97.4 F (36.3 C)   TempSrc: Oral Oral Oral   SpO2: 98% 98% 97%   Weight:      Height:        Intake/Output Summary (Last 24 hours) at 03/18/17 1433 Last data filed at 03/18/17 1300  Gross per 24 hour  Intake              350 ml  Output              1400 ml  Net            -1050 ml   Filed Weights   03/16/17 0353 03/17/17 0511 03/18/17 0456  Weight: 239 lb 3.2 oz (108.5 kg) 240 lb 14.4 oz (109.3 kg) 238 lb 1.6 oz (108 kg)    Telemetry    Normal sinus rhythm - Personally Reviewed  ECG      Physical Exam   GEN: No acute distress.  Laying in bed supine, no specific complaints  Neck: No JVD Cardiac: RRR, no murmurs, rubs, or gallops.  Respiratory: Clear to auscultation bilaterally. GI: Soft, nontender, non-distended  MS: No edema; No deformity. Neuro:  Nonfocal  Psych: Normal affect   Labs    Chemistry Recent Labs Lab 03/15/17 1147 03/16/17 0614 03/17/17 0430  NA 143 139 137  K 3.8 3.3* 3.5  CL 98* 97* 94*  CO2 34* 35* 35*  GLUCOSE 117* 190* 160*  BUN 42* 41* 43*  CREATININE 3.12* 2.80* 2.74*  CALCIUM 9.8 9.2 9.3  GFRNONAA 18* 20* 21*  GFRAA 20* 23* 24*  ANIONGAP 11 7 8      Hematology Recent Labs Lab 03/15/17  1147 03/16/17 0614  WBC 8.1 7.5  RBC 4.02* 3.71*  HGB 12.4* 11.5*  HCT 38.0* 34.5*  MCV 94.6 93.0  MCH 30.9 31.0  MCHC 32.6 33.4  RDW 15.6* 15.6*  PLT 182 150    Cardiac Enzymes Recent Labs Lab 03/15/17 1147  TROPONINI <0.03   No results for input(s): TROPIPOC in the last 168 hours.   BNP Recent Labs Lab 03/15/17 1147  BNP 92.0     DDimer No results for input(s): DDIMER in the last 168 hours.   Radiology    No results found.  Cardiac Studies     Patient Profile     Fleet Higham. is a 81 y.o. male with a history of CAD s/p CABG (2006), mixed ICM/NICM, chronic systolic CHF (EF prev as low as 25-30%  40-45% by echo 01/2017), MR s/p MVR (2006), Carotid dzs s/p R CEA, Afib/flutter s/p DCCV in 2014 and on chronic amio/eliquis, SSS s/p MDT PPM, AAA s/p repair (2008), CKD IV, HTN, HL, obesity, and COPD on supplemental O2, who Presents to the hospital with weakness, hypoxia, orthostasis  Assessment & Plan    1.  Orthostatic hypotension/Presyncope:   h/o  orthostasis  Would continue to hold all antihypertensives like losartan continue midodrine 15 mg 3 times a day Hold torsemide for now. Would take this only for significant ankle swelling or shortness of breath  wife does all of the medications.  Would likely benefit from abdominal binder, compression hose If blood pressure continues to drop could add Sudafed 60 mg every 6 hours Would avoid Florinef given his diastolic and systolic heart failure issues   2.  CAD:  S/p CABG.    Cont statin. No ASA in the setting of chronic eliquis. Holding all other medications including beta blockers  3.  Chronic systolic CHF/Mixed ICM & NICM:  EF 40-45% by echo  we will need to find the balance between euvolemia and orthostasis.   Suspect he will need torsemide as needed for ankle swelling would not be aggressive with torsemide given orthostasis  4.  Tachybrady syndrome:  s/p PPM.  No recent palpitations.    5.  CKD IV:   Creat worse, now trending down Torsemide on hold  7.  Carotid Arterial Dzs:  >32 LICA, > 50 RICA stenoses.  Stable compared to 09/2016 study.    8.  Afib/Flutter:  A paced.   Remains on amio/eliquis.  Recent abnl pft w/ reduced DLCO.   High Res CT w/o pulmonary fibrosis.    9.  MR s/p MR Repair:   Stable  DISPO Tremendously deconditioned, wife unable to take care of him at home This will lead to recurrent hospital admissions Notes indicating he has declined placement in nursing facility as it would cost him out of pocket  Case discussed with nursing and hospitalist service in detail  Total encounter time more than 35 minutes  Greater than 50% was spent in counseling and coordination of care with the patient  Signed, Ida Rogue, MD  03/18/2017, 2:33 PM

## 2017-03-18 NOTE — Progress Notes (Signed)
[place] at [place] NAME: Marco [name]    MR#:  [id number]  DATE OF BIRTH:  03-14-1936  SUBJECTIVE:  CHIEF COMPLAINT:   Chief Complaint  Patient presents with  . Fall  . Weakness  . Hypotension   - admitted with a near-syncopal episode yesterday. Feels much stronger today. -Orthostatics are still showing significant drop in BP on standing.  REVIEW OF SYSTEMS:  Review of Systems  Constitutional: Positive for malaise/fatigue. Negative for chills and fever.  HENT: Negative for congestion, ear discharge, hearing loss and nosebleeds.   Eyes: Negative for blurred vision and double vision.  Respiratory: Negative for cough, shortness of breath and wheezing.   Cardiovascular: Negative for chest pain, palpitations and leg swelling.  Gastrointestinal: Negative for abdominal pain, constipation, diarrhea, nausea and vomiting.  Genitourinary: Negative for dysuria.  Musculoskeletal: Negative for myalgias.  Neurological: Positive for dizziness. Negative for speech change, focal weakness, seizures and headaches.    DRUG ALLERGIES:   Allergies  Allergen Reactions  . Shellfish Allergy Anaphylaxis    VITALS:  Blood pressure 98/62, pulse 80, temperature (!) 97.4 F (36.3 C), temperature source Oral, resp. rate 20, height 6' (1.829 m), weight 108 kg (238 lb 1.6 oz), SpO2 97 %.  PHYSICAL EXAMINATION:  Physical Exam  GENERAL:  81 y.o.-year-old patient lying in the bed with no acute distress.  EYES: Pupils equal, round, reactive to light and accommodation. No scleral icterus. Extraocular muscles intact.  HEENT: Head atraumatic, normocephalic. Oropharynx and nasopharynx clear.  NECK:  Supple, no jugular venous distention. No thyroid enlargement, no tenderness.  LUNGS: Normal breath sounds bilaterally, no wheezing, rales,rhonchi or crepitation. No use of accessory muscles of respiration.  CARDIOVASCULAR: S1, S2 normal. No rubs, or gallops. 2/6  systolic murmur present, has a pacemaker in place ABDOMEN: Soft, nontender, nondistended. Bowel sounds present. No organomegaly or mass.  EXTREMITIES: No pedal edema, cyanosis, or clubbing.  NEUROLOGIC: Cranial nerves II through XII are intact. Muscle strength 4/5 in all extremities. Sensation intact. Gait not checked.  PSYCHIATRIC: The patient is alert and oriented x 3.  SKIN: No obvious rash, lesion, or ulcer.    LABORATORY PANEL:   CBC  Recent Labs Lab 03/16/17 0614  WBC 7.5  HGB 11.5*  HCT 34.5*  PLT 150   ------------------------------------------------------------------------------------------------------------------  Chemistries   Recent Labs Lab 03/16/17 0614 03/17/17 0430  NA 139 137  K 3.3* 3.5  CL 97* 94*  CO2 35* 35*  GLUCOSE 190* 160*  BUN 41* 43*  CREATININE 2.80* 2.74*  CALCIUM 9.2 9.3  MG 2.2  --    ------------------------------------------------------------------------------------------------------------------  Cardiac Enzymes  Recent Labs Lab 03/15/17 1147  TROPONINI <0.03   ------------------------------------------------------------------------------------------------------------------  RADIOLOGY:  No results found.  EKG:   Orders placed or performed during the hospital encounter of 03/15/17  . ED EKG  . ED EKG  . EKG 12-Lead  . EKG 12-Lead  . ED EKG  . ED EKG    ASSESSMENT AND PLAN:    #1 Near syncope- was admitted last month for the same. Noted to have significant orthostases at that time and his BP meds were discontinued at discharge and also was started on midodrine. - Was on low dose losartan and torsemide at home again. don't see midodrine or florinef ordered - Orthostatic vitals-  Still dropping.- Consulted cardio- hold all antihypertensive meds for now, resume and then increased to 15 mg TID midodrine.   No florinef due to fluid retention.  Still cont to have drop in BP , so added pseudofed and stop flomax.  - also  carotid dopplers with >70% left carotid stenosis-the following with the vascular surgeon for that. Discussed with on-call vascular surgeon who recommended outpatient follow-up for an angiogram if needed because patient is symptomatic. Patient cannot get a CT angiogram due to his CK D and cannot get an MRA due to his pacemaker. - PT consulted  #2 Chronic combined systolic and diastolic- stable, decrease the dose of torsemide. On low dose losartan.Echo with EF of 45-50%. -Follows with cardiology as outpatient. Also has a pacemaker.  #3 atrial fibrillation with sick sinus syndrome-status post pacemaker. Also on amiodarone. -On eliquis for anticoagulation  #4 hyperlipidemia-continue statin.  #5GERD-Protonix  #6 urinary retention and acute-Foley was placed in the emergency room, discontinue Foley catheter and monitor. Patient already on Proscar. Stopped flomax again- due to hypotension.  #7 Weakness    PT suggested SNF, but pt and his wife choose not to go to SNF, but [facility] and PT.  All the records are reviewed and case discussed with Care Management/Social Workerr. Management plans discussed with the patient, family and they are in agreement.  CODE STATUS: Full Code  TOTAL TIME TAKING CARE OF THIS PATIENT: 35 minutes.   POSSIBLE D/C tomorrow, DEPENDING ON CLINICAL CONDITION.   Vaughan Basta M.D on 03/18/2017 at 3:04 PM  Between 7am to 6pm - Pager - [phone]  After 6pm go to [email or web address] - password [email or web address] [facility]  Office  [phone]  CC: Primary care physician; [name], MD

## 2017-03-19 ENCOUNTER — Other Ambulatory Visit: Payer: Self-pay | Admitting: *Deleted

## 2017-03-19 DIAGNOSIS — R55 Syncope and collapse: Secondary | ICD-10-CM | POA: Diagnosis not present

## 2017-03-19 DIAGNOSIS — I2510 Atherosclerotic heart disease of native coronary artery without angina pectoris: Secondary | ICD-10-CM | POA: Diagnosis not present

## 2017-03-19 DIAGNOSIS — I4891 Unspecified atrial fibrillation: Secondary | ICD-10-CM | POA: Diagnosis not present

## 2017-03-19 DIAGNOSIS — I5042 Chronic combined systolic (congestive) and diastolic (congestive) heart failure: Secondary | ICD-10-CM | POA: Diagnosis not present

## 2017-03-19 DIAGNOSIS — I951 Orthostatic hypotension: Secondary | ICD-10-CM | POA: Diagnosis not present

## 2017-03-19 LAB — BASIC METABOLIC PANEL
Anion gap: 9 (ref 5–15)
BUN: 50 mg/dL — AB (ref 6–20)
CHLORIDE: 95 mmol/L — AB (ref 101–111)
CO2: 31 mmol/L (ref 22–32)
Calcium: 9.4 mg/dL (ref 8.9–10.3)
Creatinine, Ser: 2.67 mg/dL — ABNORMAL HIGH (ref 0.61–1.24)
GFR calc Af Amer: 25 mL/min — ABNORMAL LOW (ref >60)
GFR calc non Af Amer: 21 mL/min — ABNORMAL LOW (ref >60)
GLUCOSE: 172 mg/dL — AB (ref 65–99)
POTASSIUM: 4.3 mmol/L (ref 3.5–5.1)
Sodium: 135 mmol/L (ref 135–145)

## 2017-03-19 LAB — GLUCOSE, CAPILLARY
GLUCOSE-CAPILLARY: 156 mg/dL — AB (ref 65–99)
Glucose-Capillary: 134 mg/dL — ABNORMAL HIGH (ref 65–99)
Glucose-Capillary: 167 mg/dL — ABNORMAL HIGH (ref 65–99)
Glucose-Capillary: 148 mg/dL — ABNORMAL HIGH (ref 65–99)

## 2017-03-19 LAB — BLOOD GAS, VENOUS
ACID-BASE EXCESS: 13.8 mmol/L — AB (ref 0.0–2.0)
BICARBONATE: 41.2 mmol/L — AB (ref 20.0–28.0)
PCO2 VEN: 65 mmHg — AB (ref 44.0–60.0)
Patient temperature: 37
pH, Ven: 7.41 (ref 7.250–7.430)

## 2017-03-19 MED ORDER — SODIUM CHLORIDE 0.9 % IV SOLN
INTRAVENOUS | Status: DC
  Administered 2017-03-19 – 2017-03-20 (×2): via INTRAVENOUS

## 2017-03-19 MED ORDER — PSEUDOEPHEDRINE HCL 30 MG PO TABS
60.0000 mg | ORAL_TABLET | Freq: Four times a day (QID) | ORAL | Status: DC
  Administered 2017-03-19 – 2017-03-20 (×4): 60 mg via ORAL
  Filled 2017-03-19 (×6): qty 2

## 2017-03-19 NOTE — Progress Notes (Signed)
[place] at [place] NAME: Marco [name]    MR#:  [id number]  DATE OF BIRTH:  [birth year]  SUBJECTIVE:  CHIEF COMPLAINT:   Chief Complaint  Patient presents with  . Fall  . Weakness  . Hypotension   - admitted with a near-syncopal episode yesterday. Feels much stronger today. -Orthostatics are still showing significant drop in BP on standing. Dropped up to 70 on standing.  REVIEW OF SYSTEMS:  Review of Systems  Constitutional: Positive for malaise/fatigue. Negative for chills and fever.  HENT: Negative for congestion, ear discharge, hearing loss and nosebleeds.   Eyes: Negative for blurred vision and double vision.  Respiratory: Negative for cough, shortness of breath and wheezing.   Cardiovascular: Negative for chest pain, palpitations and leg swelling.  Gastrointestinal: Negative for abdominal pain, constipation, diarrhea, nausea and vomiting.  Genitourinary: Negative for dysuria.  Musculoskeletal: Negative for myalgias.  Neurological: Positive for dizziness. Negative for speech change, focal weakness, seizures and headaches.    DRUG ALLERGIES:   Allergies  Allergen Reactions  . Shellfish Allergy Anaphylaxis    VITALS:  Blood pressure 136/62, pulse 75, temperature 98.3 F (36.8 C), resp. rate 20, height 6' (1.829 m), weight 109.1 kg (240 lb 9.6 oz), SpO2 100 %.  PHYSICAL EXAMINATION:  Physical Exam  GENERAL:  81 y.o.-year-old patient lying in the bed with no acute distress.  EYES: Pupils equal, round, reactive to light and accommodation. No scleral icterus. Extraocular muscles intact.  HEENT: Head atraumatic, normocephalic. Oropharynx and nasopharynx clear.  NECK:  Supple, no jugular venous distention. No thyroid enlargement, no tenderness.  LUNGS: Normal breath sounds bilaterally, no wheezing, rales,rhonchi or crepitation. No use of accessory muscles of respiration.  CARDIOVASCULAR: S1, S2 normal. No rubs, or gallops.  2/6 systolic murmur present, has a pacemaker in place ABDOMEN: Soft, nontender, nondistended. Bowel sounds present. No organomegaly or mass.  EXTREMITIES: No pedal edema, cyanosis, or clubbing.  NEUROLOGIC: Cranial nerves II through XII are intact. Muscle strength 4/5 in all extremities. Sensation intact. Gait not checked.  PSYCHIATRIC: The patient is alert and oriented x 3.  SKIN: No obvious rash, lesion, or ulcer.    LABORATORY PANEL:   CBC  Recent Labs Lab 03/16/17 0614  WBC 7.5  HGB 11.5*  HCT 34.5*  PLT 150   ------------------------------------------------------------------------------------------------------------------  Chemistries   Recent Labs Lab 03/16/17 0614  03/19/17 1229  NA 139  < > 135  K 3.3*  < > 4.3  CL 97*  < > 95*  CO2 35*  < > 31  GLUCOSE 190*  < > 172*  BUN 41*  < > 50*  CREATININE 2.80*  < > 2.67*  CALCIUM 9.2  < > 9.4  MG 2.2  --   --   < > = values in this interval not displayed. ------------------------------------------------------------------------------------------------------------------  Cardiac Enzymes  Recent Labs Lab 03/15/17 1147  TROPONINI <0.03   ------------------------------------------------------------------------------------------------------------------  RADIOLOGY:  No results found.  EKG:   Orders placed or performed during the hospital encounter of 03/15/17  . ED EKG  . ED EKG  . EKG 12-Lead  . EKG 12-Lead  . ED EKG  . ED EKG    ASSESSMENT AND PLAN:    #1 Near syncope- was admitted last month for the same. Noted to have significant orthostases at that time and his BP meds were discontinued at discharge and also was started on midodrine. - Was on low dose losartan and torsemide at home  again. don't see midodrine or florinef ordered - Orthostatic vitals-  Still dropping.- Consulted cardio- hold all antihypertensive meds for now, resume and then increased to 15 mg TID midodrine.   No florinef due to  fluid retention. Still cont to have drop in BP , so added pseudofed and stop flomax.   Increased dose of pseudofed today, add abdominal binder.  - also carotid dopplers with >70% left carotid stenosis-the following with the vascular surgeon for that. Discussed with on-call vascular surgeon who recommended outpatient follow-up for an angiogram if needed because patient is symptomatic. Patient cannot get a CT angiogram due to his CK D and cannot get an MRA due to his pacemaker. - PT consulted  #2 Chronic combined systolic and diastolic- stable, decrease the dose of torsemide. On low dose losartan.Echo with EF of 45-50%. -Follows with cardiology as outpatient. Also has a pacemaker.  #3 atrial fibrillation with sick sinus syndrome-status post pacemaker. Also on amiodarone. -On eliquis for anticoagulation  #4 hyperlipidemia-continue statin.  #5GERD-Protonix  #6 urinary retention and acute-Foley was placed in the emergency room, discontinue Foley catheter and monitor. Patient already on Proscar. Stopped flomax again- due to hypotension.   Changed proscar dose to evening.  #7 Weakness    PT suggested SNF, but pt and his wife choose not to go to SNF, but [facility] and PT.  All the records are reviewed and case discussed with Care Management/Social Workerr. Management plans discussed with the patient, family and they are in agreement.  CODE STATUS: Full Code  TOTAL TIME TAKING CARE OF THIS PATIENT: 35 minutes.   POSSIBLE D/C tomorrow, DEPENDING ON CLINICAL CONDITION.   Vaughan Basta M.D on 03/19/2017 at 6:44 PM  Between 7am to 6pm - Pager - [phone]  After 6pm go to [email or web address] - password [email or web address] [facility]  Office  [phone]  CC: Primary care physician; [name], MD

## 2017-03-19 NOTE — Care Management (Signed)
Remained signficantly orthostatic over the weekend. All hypertensives held.  Adjust torsemide dose (held) and added midodrine.

## 2017-03-19 NOTE — Progress Notes (Signed)
Patient Name: Marco Yagi [facility] Sr. Date of Encounter: 03/19/2017  Primary Cardiologist: [facility] Problem List     Principal Problem:   Near syncope Active Problems:   Atrial fibrillation and flutter (HCC)   Chronic combined systolic and diastolic CHF (congestive heart failure) (HCC)   Essential hypertension   Atherosclerosis of coronary artery   Gastro-esophageal reflux disease without esophagitis     Subjective   Remains orthostatic with SBP dropping from 93 to 73 when sitting to standing. This was checked after his midodrine.   Inpatient Medications    Scheduled Meds: . amiodarone  200 mg Oral Daily  . apixaban  5 mg Oral BID  . calcitRIOL  0.5 mcg Oral Q breakfast  . cholecalciferol  1,000 Units Oral Daily  . docusate sodium  100 mg Oral BID  . finasteride  5 mg Oral Daily  . insulin aspart  0-5 Units Subcutaneous QHS  . insulin aspart  0-9 Units Subcutaneous TID WC  . midodrine  15 mg Oral TID WC  . pantoprazole  40 mg Oral QAC breakfast  . potassium chloride  20 mEq Oral Daily  . pseudoephedrine  60 mg Oral Q6H  . rosuvastatin  10 mg Oral QHS   Continuous Infusions: . sodium chloride     PRN Meds: acetaminophen **OR** acetaminophen, neomycin-bacitracin-polymyxin, ondansetron **OR** ondansetron (ZOFRAN) IV, polyethylene glycol, zolpidem   Vital Signs    Vitals:   03/18/17 1400 03/18/17 1941 03/19/17 0307 03/19/17 0800  BP: 98/62 136/63 (!) 153/58 121/61  Pulse: 80 83 61 75  Resp:  18 18 16   Temp:  97.7 F (36.5 C) 97.8 F (36.6 C) 98.9 F (37.2 C)  TempSrc:  Oral Oral Oral  SpO2:  99% 100% 95%  Weight:   240 lb 9.6 oz (109.1 kg)   Height:        Intake/Output Summary (Last 24 hours) at 03/19/17 1232 Last data filed at 03/19/17 1021  Gross per 24 hour  Intake              480 ml  Output             1600 ml  Net            -1120 ml   Filed Weights   03/17/17 0511 03/18/17 0456 03/19/17 0307  Weight: 240 lb 14.4 oz (109.3 kg)  238 lb 1.6 oz (108 kg) 240 lb 9.6 oz (109.1 kg)    Physical Exam    GEN: Well nourished, well developed, in no acute distress.  HEENT: Grossly normal.  Neck: Supple, no JVD, carotid bruits, or masses. Cardiac: RRR, II/Vi systolic murmur, no rubs, or gallops. No clubbing, cyanosis, edema.  Radials/DP/PT 2+ and equal bilaterally.  Respiratory:  Respirations regular and unlabored, clear to auscultation bilaterally. GI: Soft, nontender, nondistended, BS + x 4. MS: no deformity or atrophy. Skin: warm and dry, no rash. Neuro:  Strength and sensation are intact. Psych: AAOx3.  Normal affect.  Labs    CBC No results for input(s): WBC, NEUTROABS, HGB, HCT, MCV, PLT in the last 72 hours. Basic Metabolic Panel  Recent Labs  03/17/17 0430  NA 137  K 3.5  CL 94*  CO2 35*  GLUCOSE 160*  BUN 43*  CREATININE 2.74*  CALCIUM 9.3   Liver Function Tests No results for input(s): AST, ALT, ALKPHOS, BILITOT, PROT, ALBUMIN in the last 72 hours. No results for input(s): LIPASE, AMYLASE in the last 72 hours. Cardiac  Enzymes No results for input(s): CKTOTAL, CKMB, CKMBINDEX, TROPONINI in the last 72 hours. BNP Invalid input(s): POCBNP D-Dimer No results for input(s): DDIMER in the last 72 hours. Hemoglobin A1C No results for input(s): HGBA1C in the last 72 hours. Fasting Lipid Panel No results for input(s): CHOL, HDL, LDLCALC, TRIG, CHOLHDL, LDLDIRECT in the last 72 hours. Thyroid Function Tests No results for input(s): TSH, T4TOTAL, T3FREE, THYROIDAB in the last 72 hours.  Invalid input(s): FREET3  Telemetry    Paced - Personally Reviewed  ECG    n/a - Personally Reviewed  Radiology    No results found.  Cardiac Studies   TTE 02/02/2017: Study Conclusions  - Left ventricle: Poor image quality even with definity. The cavity   size was normal. Wall thickness was increased in a pattern of   moderate LVH. Systolic function was mildly reduced. The estimated   ejection fraction  was in the range of 45% to 50%. Diffuse   hypokinesis. The study is not technically sufficient to allow   evaluation of LV diastolic function. - Mitral valve: Post repair with annuloplasty ring trivial residual   MR normal diastolic gradients. Valve area by pressure half-time:   1.79 cm^2. - Left atrium: The atrium was moderately dilated.  Patient Profile     81 y.o. male with history of CAD s/ CABG, MVR, PAF on Eliquis, AAA repair in 12/2006, carotid artery disease s/p CEA, prior DVT, combined CHF/ICM, tachybrady syndrome s/p PPM, orthostatic hypotension, and obesity who presented to [facility] with weakness, hypoxia, and orthostasis.   Assessment & Plan    1. Orthostatic hypotension: -Midodrine 15 mg tid was added -He remains orthostatic  -Consider adding mestinon if EP feels like he would benefit from this -Add abdominal binder -Hold antihypertensive medications -Avoid Florinef given CHF  2. CAD s/p CABG; -No symptoms concerning for angina -On Eliquis in place of ASA -BB on hold given the above  3. Chronic combined CHF: -He does not appear volume overloaded at this time -HF medications on hold given #1 -PRN torsemide for SOB/LE swelling  4. Tachybrady syndrome: -S/p PPM  5. CKD stage IV: -Check bmet today -May need to transition to warfarin from Eliquis if renal function is declining   6. PAF: -Paced currently -Eliquis as above -CHADS2VASc at least 6 (CHF, HTN, age x 2, DM, vascular disease) -BB on hold given #1  7. MVR: -Recent echo as above -Outpatient follow up  8. Carotid artery disease: -Outpatient follow up  Signed, Christell Faith, PA-C [place] Pager: [phone] 03/19/2017, 12:32 PM

## 2017-03-19 NOTE — Patient Outreach (Signed)
Call from patient wife, Shauna Hugh.  She reports that patient is in the hospital, needs to cancel appointment with [facility] RNCM tomorrow. She reports she cannot find Kim's number to call directly. Requests that this RNCM get message to [place] regarding patient inpatient hospitalization. She is unsure when patient will be able to discharge home.  Plan: RNCM will notify [facility] RNCM of patient admission and request she cancel tomorrows appointment and reschedule later this week.   Royetta Crochet. Laymond Purser, RN, BSN, [place] [phone]) Business Cell  [phone]) Toll Free Office

## 2017-03-20 ENCOUNTER — Encounter
Admission: RE | Admit: 2017-03-20 | Discharge: 2017-03-20 | Disposition: A | Discharge status: Home / Self Care | Payer: Medicare Other | Source: Ambulatory Visit | Attending: Internal Medicine | Admitting: Internal Medicine

## 2017-03-20 ENCOUNTER — Ambulatory Visit: Payer: Medicare Other | Admitting: *Deleted

## 2017-03-20 DIAGNOSIS — J9611 Chronic respiratory failure with hypoxia: Secondary | ICD-10-CM | POA: Diagnosis not present

## 2017-03-20 DIAGNOSIS — Z951 Presence of aortocoronary bypass graft: Secondary | ICD-10-CM | POA: Diagnosis not present

## 2017-03-20 DIAGNOSIS — R809 Proteinuria, unspecified: Secondary | ICD-10-CM | POA: Diagnosis not present

## 2017-03-20 DIAGNOSIS — M6281 Muscle weakness (generalized): Secondary | ICD-10-CM | POA: Diagnosis not present

## 2017-03-20 DIAGNOSIS — E271 Primary adrenocortical insufficiency: Secondary | ICD-10-CM | POA: Diagnosis not present

## 2017-03-20 DIAGNOSIS — G909 Disorder of the autonomic nervous system, unspecified: Secondary | ICD-10-CM | POA: Diagnosis not present

## 2017-03-20 DIAGNOSIS — T148XXA Other injury of unspecified body region, initial encounter: Secondary | ICD-10-CM | POA: Diagnosis not present

## 2017-03-20 DIAGNOSIS — I951 Orthostatic hypotension: Secondary | ICD-10-CM | POA: Diagnosis not present

## 2017-03-20 DIAGNOSIS — G4733 Obstructive sleep apnea (adult) (pediatric): Secondary | ICD-10-CM | POA: Diagnosis not present

## 2017-03-20 DIAGNOSIS — R338 Other retention of urine: Secondary | ICD-10-CM | POA: Diagnosis not present

## 2017-03-20 DIAGNOSIS — I252 Old myocardial infarction: Secondary | ICD-10-CM | POA: Diagnosis not present

## 2017-03-20 DIAGNOSIS — L97528 Non-pressure chronic ulcer of other part of left foot with other specified severity: Secondary | ICD-10-CM | POA: Diagnosis not present

## 2017-03-20 DIAGNOSIS — Z9981 Dependence on supplemental oxygen: Secondary | ICD-10-CM | POA: Diagnosis not present

## 2017-03-20 DIAGNOSIS — R55 Syncope and collapse: Secondary | ICD-10-CM | POA: Diagnosis not present

## 2017-03-20 DIAGNOSIS — Z86718 Personal history of other venous thrombosis and embolism: Secondary | ICD-10-CM | POA: Diagnosis not present

## 2017-03-20 DIAGNOSIS — L97521 Non-pressure chronic ulcer of other part of left foot limited to breakdown of skin: Secondary | ICD-10-CM | POA: Diagnosis not present

## 2017-03-20 DIAGNOSIS — E119 Type 2 diabetes mellitus without complications: Secondary | ICD-10-CM | POA: Diagnosis not present

## 2017-03-20 DIAGNOSIS — E118 Type 2 diabetes mellitus with unspecified complications: Secondary | ICD-10-CM | POA: Diagnosis not present

## 2017-03-20 DIAGNOSIS — E1151 Type 2 diabetes mellitus with diabetic peripheral angiopathy without gangrene: Secondary | ICD-10-CM | POA: Diagnosis not present

## 2017-03-20 DIAGNOSIS — I110 Hypertensive heart disease with heart failure: Secondary | ICD-10-CM | POA: Diagnosis not present

## 2017-03-20 DIAGNOSIS — I5042 Chronic combined systolic (congestive) and diastolic (congestive) heart failure: Secondary | ICD-10-CM | POA: Diagnosis not present

## 2017-03-20 DIAGNOSIS — B351 Tinea unguium: Secondary | ICD-10-CM | POA: Diagnosis not present

## 2017-03-20 DIAGNOSIS — Z09 Encounter for follow-up examination after completed treatment for conditions other than malignant neoplasm: Secondary | ICD-10-CM | POA: Diagnosis not present

## 2017-03-20 DIAGNOSIS — I482 Chronic atrial fibrillation: Secondary | ICD-10-CM | POA: Diagnosis not present

## 2017-03-20 DIAGNOSIS — N2581 Secondary hyperparathyroidism of renal origin: Secondary | ICD-10-CM | POA: Diagnosis not present

## 2017-03-20 DIAGNOSIS — I509 Heart failure, unspecified: Secondary | ICD-10-CM | POA: Diagnosis not present

## 2017-03-20 DIAGNOSIS — R262 Difficulty in walking, not elsewhere classified: Secondary | ICD-10-CM | POA: Diagnosis not present

## 2017-03-20 DIAGNOSIS — Z87891 Personal history of nicotine dependence: Secondary | ICD-10-CM | POA: Diagnosis not present

## 2017-03-20 DIAGNOSIS — L97518 Non-pressure chronic ulcer of other part of right foot with other specified severity: Secondary | ICD-10-CM | POA: Diagnosis not present

## 2017-03-20 DIAGNOSIS — I340 Nonrheumatic mitral (valve) insufficiency: Secondary | ICD-10-CM | POA: Diagnosis not present

## 2017-03-20 DIAGNOSIS — J309 Allergic rhinitis, unspecified: Secondary | ICD-10-CM | POA: Diagnosis not present

## 2017-03-20 DIAGNOSIS — E785 Hyperlipidemia, unspecified: Secondary | ICD-10-CM | POA: Diagnosis not present

## 2017-03-20 DIAGNOSIS — I6522 Occlusion and stenosis of left carotid artery: Secondary | ICD-10-CM | POA: Diagnosis not present

## 2017-03-20 DIAGNOSIS — I4891 Unspecified atrial fibrillation: Secondary | ICD-10-CM | POA: Diagnosis not present

## 2017-03-20 DIAGNOSIS — Z950 Presence of cardiac pacemaker: Secondary | ICD-10-CM | POA: Diagnosis not present

## 2017-03-20 DIAGNOSIS — E1122 Type 2 diabetes mellitus with diabetic chronic kidney disease: Secondary | ICD-10-CM | POA: Diagnosis not present

## 2017-03-20 DIAGNOSIS — Z7984 Long term (current) use of oral hypoglycemic drugs: Secondary | ICD-10-CM | POA: Diagnosis not present

## 2017-03-20 DIAGNOSIS — G903 Multi-system degeneration of the autonomic nervous system: Secondary | ICD-10-CM | POA: Diagnosis not present

## 2017-03-20 DIAGNOSIS — R0989 Other specified symptoms and signs involving the circulatory and respiratory systems: Secondary | ICD-10-CM | POA: Diagnosis not present

## 2017-03-20 DIAGNOSIS — I480 Paroxysmal atrial fibrillation: Secondary | ICD-10-CM | POA: Diagnosis not present

## 2017-03-20 DIAGNOSIS — I130 Hypertensive heart and chronic kidney disease with heart failure and stage 1 through stage 4 chronic kidney disease, or unspecified chronic kidney disease: Secondary | ICD-10-CM | POA: Diagnosis not present

## 2017-03-20 DIAGNOSIS — L308 Other specified dermatitis: Secondary | ICD-10-CM | POA: Diagnosis not present

## 2017-03-20 DIAGNOSIS — R0600 Dyspnea, unspecified: Secondary | ICD-10-CM | POA: Diagnosis not present

## 2017-03-20 DIAGNOSIS — R42 Dizziness and giddiness: Secondary | ICD-10-CM | POA: Diagnosis not present

## 2017-03-20 DIAGNOSIS — E46 Unspecified protein-calorie malnutrition: Secondary | ICD-10-CM | POA: Diagnosis not present

## 2017-03-20 DIAGNOSIS — N401 Enlarged prostate with lower urinary tract symptoms: Secondary | ICD-10-CM | POA: Diagnosis not present

## 2017-03-20 DIAGNOSIS — D631 Anemia in chronic kidney disease: Secondary | ICD-10-CM | POA: Diagnosis not present

## 2017-03-20 DIAGNOSIS — E559 Vitamin D deficiency, unspecified: Secondary | ICD-10-CM | POA: Diagnosis not present

## 2017-03-20 DIAGNOSIS — I495 Sick sinus syndrome: Secondary | ICD-10-CM | POA: Diagnosis not present

## 2017-03-20 DIAGNOSIS — E11621 Type 2 diabetes mellitus with foot ulcer: Secondary | ICD-10-CM | POA: Diagnosis not present

## 2017-03-20 DIAGNOSIS — N184 Chronic kidney disease, stage 4 (severe): Secondary | ICD-10-CM | POA: Diagnosis not present

## 2017-03-20 DIAGNOSIS — I255 Ischemic cardiomyopathy: Secondary | ICD-10-CM | POA: Diagnosis not present

## 2017-03-20 DIAGNOSIS — R531 Weakness: Secondary | ICD-10-CM | POA: Diagnosis not present

## 2017-03-20 DIAGNOSIS — I2510 Atherosclerotic heart disease of native coronary artery without angina pectoris: Secondary | ICD-10-CM | POA: Diagnosis not present

## 2017-03-20 DIAGNOSIS — E1140 Type 2 diabetes mellitus with diabetic neuropathy, unspecified: Secondary | ICD-10-CM | POA: Diagnosis not present

## 2017-03-20 DIAGNOSIS — R339 Retention of urine, unspecified: Secondary | ICD-10-CM | POA: Diagnosis not present

## 2017-03-20 DIAGNOSIS — R6889 Other general symptoms and signs: Secondary | ICD-10-CM | POA: Diagnosis not present

## 2017-03-20 DIAGNOSIS — Z794 Long term (current) use of insulin: Secondary | ICD-10-CM | POA: Diagnosis not present

## 2017-03-20 DIAGNOSIS — Z9911 Dependence on respirator [ventilator] status: Secondary | ICD-10-CM | POA: Diagnosis not present

## 2017-03-20 DIAGNOSIS — K219 Gastro-esophageal reflux disease without esophagitis: Secondary | ICD-10-CM | POA: Diagnosis not present

## 2017-03-20 DIAGNOSIS — L97519 Non-pressure chronic ulcer of other part of right foot with unspecified severity: Secondary | ICD-10-CM | POA: Diagnosis not present

## 2017-03-20 DIAGNOSIS — J189 Pneumonia, unspecified organism: Secondary | ICD-10-CM | POA: Diagnosis not present

## 2017-03-20 DIAGNOSIS — L97522 Non-pressure chronic ulcer of other part of left foot with fat layer exposed: Secondary | ICD-10-CM | POA: Diagnosis not present

## 2017-03-20 DIAGNOSIS — Z7901 Long term (current) use of anticoagulants: Secondary | ICD-10-CM | POA: Diagnosis not present

## 2017-03-20 LAB — CBC
HCT: 33.3 % — ABNORMAL LOW (ref 40.0–52.0)
Hemoglobin: 11.2 g/dL — ABNORMAL LOW (ref 13.0–18.0)
MCH: 31.7 pg (ref 26.0–34.0)
MCHC: 33.5 g/dL (ref 32.0–36.0)
MCV: 94.7 fL (ref 80.0–100.0)
PLATELETS: 138 10*3/uL — AB (ref 150–440)
RBC: 3.52 MIL/uL — ABNORMAL LOW (ref 4.40–5.90)
RDW: 15.3 % — ABNORMAL HIGH (ref 11.5–14.5)
WBC: 6.5 10*3/uL (ref 3.8–10.6)

## 2017-03-20 LAB — GLUCOSE, CAPILLARY
GLUCOSE-CAPILLARY: 154 mg/dL — AB (ref 65–99)
GLUCOSE-CAPILLARY: 139 mg/dL — AB (ref 65–99)
Glucose-Capillary: 134 mg/dL — ABNORMAL HIGH (ref 65–99)

## 2017-03-20 MED ORDER — MIDODRINE HCL 5 MG PO TABS: 15.0000 mg | ORAL_TABLET | Freq: Three times a day (TID) | ORAL | 1 refills | Status: DC

## 2017-03-20 MED ORDER — FINASTERIDE 5 MG PO TABS: 5.0000 mg | ORAL_TABLET | ORAL | 0 refills | Status: AC

## 2017-03-20 MED ORDER — PSEUDOEPHEDRINE HCL 60 MG PO TABS: 60.0000 mg | ORAL_TABLET | Freq: Four times a day (QID) | ORAL | 1 refills | Status: DC

## 2017-03-20 NOTE — Clinical Social Work Note (Signed)
Clinical Social Work Assessment  Patient Details  Name: Marco Kedzierski Sr. MRN: [id number] Date of Birth: [birth year]  Date of referral:  03/16/17               Reason for consult:  Facility Placement                Permission sought to share information with:  Family Supports, Customer service manager Permission granted to share information::  Yes, Verbal Permission Granted  Name::     [place], Delia [phone]  [phone]   Agency::  SNF admissions  Relationship::     Contact Information:     Housing/Transportation Living arrangements for the past 2 months:  [place], [place] of Information:  Patient, Spouse Patient Interpreter Needed:  None Criminal Activity/Legal Involvement Pertinent to Current Situation/Hospitalization:  No - Comment as needed Significant Relationships:  Spouse Lives with:  Spouse Do you feel safe going back to the place where you live?  No Need for family participation in patient care:  No (Coment)  Care giving concerns:  Patient and wife feel he needs some short term rehab before he is able to return back home.   Social Worker assessment / plan:  Patient is a 81 year old male who is alert and oriented x4.  Patient states he was recently at SNF and at first did not want to go to SNF, but then patient changed his mind and decided he should go.  Patient expressed that he felt the last SNF did not help him, however patient's wife felt like it did benefit him.  Patient and wife explained how insurance will pay for SNF.  CSW explained to patient and his wife because he was at previous SNF for 16 days he will only have 4 days left of 100% coverage.  CSW informed patient and his wife that after 4 days are up, he will be in copay days, patient and his wife are aware of this.  CSW advised they speak to social worker at SNF to discuss applying for Medicaid for the future.   Employment status:  Retired Designer, industrial/product PT Recommendations:  [place] / Referral to community resources:  [place]  Patient/Family's Response to care:  Patient and family are agreeable to going to SNF for short term rehab.  Patient/Family's Understanding of and Emotional Response to Diagnosis, Current Treatment, and Prognosis:  Patient is hopeful that he will be able to return home soon, and will not have to be at [facility] for very long.  Emotional Assessment Appearance:  Appears stated age Attitude/Demeanor/Rapport:    Affect (typically observed):  Appropriate, Calm Orientation:  Oriented to Self, Oriented to Place, Oriented to  Time, Oriented to Situation Alcohol / Substance use:  Not Applicable Psych involvement (Current and /or in the community):  No (Comment)  Discharge Needs  Concerns to be addressed:  Lack of Support Readmission within the last 30 days:  Yes Current discharge risk:  None Barriers to Discharge:  No Barriers Identified   Anell Barr 03/20/2017, 3:33 PM

## 2017-03-20 NOTE — Progress Notes (Signed)
Patient Name: Marco Piscitello [facility] Sr. Date of Encounter: 03/20/2017  Primary Cardiologist: [facility] Problem List     Principal Problem:   Near syncope Active Problems:   Atrial fibrillation and flutter (HCC)   Chronic combined systolic and diastolic CHF (congestive heart failure) (HCC)   Essential hypertension   Atherosclerosis of coronary artery   Gastro-esophageal reflux disease without esophagitis     Subjective   Feels "great" this morning. Await fitting of abdominal binder. No chest pain or SOB. Has not gotten out of bed yet. HGB stable.   Inpatient Medications    Scheduled Meds: . amiodarone  200 mg Oral Daily  . apixaban  5 mg Oral BID  . calcitRIOL  0.5 mcg Oral Q breakfast  . cholecalciferol  1,000 Units Oral Daily  . docusate sodium  100 mg Oral BID  . finasteride  5 mg Oral Daily  . insulin aspart  0-5 Units Subcutaneous QHS  . insulin aspart  0-9 Units Subcutaneous TID WC  . midodrine  15 mg Oral TID WC  . pantoprazole  40 mg Oral QAC breakfast  . pseudoephedrine  60 mg Oral Q6H  . rosuvastatin  10 mg Oral QHS   Continuous Infusions:  PRN Meds: acetaminophen **OR** acetaminophen, neomycin-bacitracin-polymyxin, ondansetron **OR** ondansetron (ZOFRAN) IV, polyethylene glycol, zolpidem   Vital Signs    Vitals:   03/19/17 0800 03/19/17 1316 03/19/17 2103 03/20/17 0511  BP: 121/61 136/62 (!) 159/73 (!) 124/41  Pulse: 75 75 81 (!) 59  Resp: 16 20 18 18   Temp: 98.9 F (37.2 C) 98.3 F (36.8 C) 98 F (36.7 C) 97.9 F (36.6 C)  TempSrc: Oral  Oral Oral  SpO2: 95% 100% 93% 100%  Weight:      Height:        Intake/Output Summary (Last 24 hours) at 03/20/17 0818 Last data filed at 03/20/17 0640  Gross per 24 hour  Intake             1395 ml  Output              850 ml  Net              545 ml   Filed Weights   03/17/17 0511 03/18/17 0456 03/19/17 0307  Weight: 240 lb 14.4 oz (109.3 kg) 238 lb 1.6 oz (108 kg) 240 lb 9.6 oz (109.1 kg)     Physical Exam    GEN: Well nourished, well developed, in no acute distress.  HEENT: Grossly normal.  Neck: Supple, no JVD, carotid bruits, or masses. Cardiac: RRR, II/Vi systolic murmur, no rubs, or gallops. No clubbing, cyanosis, edema.  Radials/DP/PT 2+ and equal bilaterally.  Respiratory:  Respirations regular and unlabored, clear to auscultation bilaterally. GI: Soft, nontender, nondistended, BS + x 4. MS: no deformity or atrophy. Skin: warm and dry, no rash. Neuro:  Strength and sensation are intact. Psych: AAOx3.  Normal affect.  Labs    CBC  Recent Labs  03/20/17 0523  WBC 6.5  HGB 11.2*  HCT 33.3*  MCV 94.7  PLT 450*   Basic Metabolic Panel  Recent Labs  03/19/17 1229  NA 135  K 4.3  CL 95*  CO2 31  GLUCOSE 172*  BUN 50*  CREATININE 2.67*  CALCIUM 9.4   Liver Function Tests No results for input(s): AST, ALT, ALKPHOS, BILITOT, PROT, ALBUMIN in the last 72 hours. No results for input(s): LIPASE, AMYLASE in the last 72 hours. Cardiac Enzymes  No results for input(s): CKTOTAL, CKMB, CKMBINDEX, TROPONINI in the last 72 hours. BNP Invalid input(s): POCBNP D-Dimer No results for input(s): DDIMER in the last 72 hours. Hemoglobin A1C No results for input(s): HGBA1C in the last 72 hours. Fasting Lipid Panel No results for input(s): CHOL, HDL, LDLCALC, TRIG, CHOLHDL, LDLDIRECT in the last 72 hours. Thyroid Function Tests No results for input(s): TSH, T4TOTAL, T3FREE, THYROIDAB in the last 72 hours.  Invalid input(s): FREET3  Telemetry    Paced - Personally Reviewed  ECG    n/a - Personally Reviewed  Radiology    No results found.  Cardiac Studies   TTE 02/02/2017: Study Conclusions  - Left ventricle: Poor image quality even with definity. The cavity   size was normal. Wall thickness was increased in a pattern of   moderate LVH. Systolic function was mildly reduced. The estimated   ejection fraction was in the range of 45% to 50%.  Diffuse   hypokinesis. The study is not technically sufficient to allow   evaluation of LV diastolic function. - Mitral valve: Post repair with annuloplasty ring trivial residual   MR normal diastolic gradients. Valve area by pressure half-time:   1.79 cm^2. - Left atrium: The atrium was moderately dilated.  Patient Profile     81 y.o. male with history of CAD s/ CABG, MVR, PAF on Eliquis, AAA repair in 12/2006, carotid artery disease s/p CEA, prior DVT, combined CHF/ICM, tachybrady syndrome s/p PPM, orthostatic hypotension, and obesity who presented to [facility] with weakness, hypoxia, and orthostasis.   Assessment & Plan    1. Orthostatic hypotension: -Midodrine 15 mg tid was added -Ambulate today and assess orthostatic vital signs -If the above is ok/stable, possible discharge later today -Consider adding mestinon if EP feels like he would benefit from this -Add abdominal binder, awaiting fitting -Hold antihypertensive medications, restart slowly when able -Avoid Florinef given CHF  2. CAD s/p CABG; -No symptoms concerning for angina -On Eliquis in place of ASA -BB on hold given the above  3. Chronic combined CHF: -He does not appear volume overloaded at this time -HF medications on hold given #1 -PRN torsemide for SOB/LE swelling  4. Tachybrady syndrome: -S/p PPM  5. CKD stage IV: -Check bmet today -May need to transition to warfarin from Eliquis if renal function is declining   6. PAF: -Paced currently -Eliquis as above -CHADS2VASc at least 6 (CHF, HTN, age x 2, DM, vascular disease) -BB on hold given #1  7. MVR: -Recent echo as above -Outpatient follow up  8. Carotid artery disease: -Outpatient follow up  Signed, Christell Faith, PA-C [place] Pager: [phone] 03/20/2017, 8:18 AM

## 2017-03-20 NOTE — Discharge Summary (Signed)
[facility] [place] NAME: Marco [name]    MR#:  [id number]  DATE OF BIRTH:  Jul 11, 1936  DATE OF ADMISSION:  03/15/2017 ADMITTING PHYSICIAN: Lance Coon, MD  DATE OF DISCHARGE: 03/20/2017  PRIMARY CARE PHYSICIAN: [name], MD    ADMISSION DIAGNOSIS:  Hypercarbia [R06.89] Weakness [R53.1] Chronic kidney disease, unspecified CKD stage [N18.9]  DISCHARGE DIAGNOSIS:  Principal Problem:   Near syncope Active Problems:   Atrial fibrillation and flutter (HCC)   Chronic combined systolic and diastolic CHF (congestive heart failure) (HCC)   Essential hypertension   Atherosclerosis of coronary artery   Gastro-esophageal reflux disease without esophagitis   Orthostatic hypotension  SECONDARY DIAGNOSIS:   Past Medical History:  Diagnosis Date  . AAA (abdominal aortic aneurysm)repaired    a. 12/2006 s/p repair of 6cm infrarenal AAA with 11mm Dacron graft.  . Anemia   . Anginal pain ([place])   . Atrial fibrillation/Flutter    a. Dx 2014;  b. 10/2012 s/p TEE/DCCV;  c. On amio and Eliquis (CHA2DS2VASc = 6).  . Basal cell carcinoma    a. 2013 x 2 back and left neck  . BPH (benign prostatic hypertrophy)   . Carotid artery occlusion    a. 11/2008 s/p R CEA; b. 01/2017 Carotid U/S: RICA <76%, LICA >19%.  . Chronic systolic heart failure ([place])    a. 02/2012 Echo: EF 30-35%, Pulm HTN, and modest RV dysfunction;  b. 10/2012 TEE: EF 25-30%, mild conc LVH, diff HK, mod RV dysfxn, mod dil RA/LA, mild MR, no MS, nl MVR, mod TR, Ao sclerosis; c. 01/2017 Echo: EF 45-50%, diff HK, triv MR, mod dil LA.  . CKD (chronic kidney disease), stage IV ([place])   . Coronary artery disease    a. 1999 PCI RCA;  b. 01/2005 NSTEMI: CABG x 3 (LIMA->LAD, VG->OM, VG->LPL) @ time of MVR.  [name] DVT (deep venous thrombosis) ([place])   . GERD (gastroesophageal reflux disease)   . History of hiatal hernia   . History of shingles   . Hyperlipidemia   . Hypertensive  heart disease   . Mitral regurgitation--s/p repair    a. 01/2005 MVR: 28-mm Edwards ET Logix ring annuloplasty; b. 10/2012 TEE: normal fxning MV ring with mild MR, no MS; 01/2017 Echo: Triv MR.  . Mixed Ischemic and Non-ischemic Cardiomyopathy    a. 02/2012 Echo: EF 30-35%, Pulm HTN, and modest RV dysfunction;  b. 10/2012 TEE: EF 25-30%, mild conc LVH, diff HK, mod RV dysfxn, mod dil RA/LA, mild MR, no MS, nl MVR, mod TR, Ao sclerosis; c. 01/2017 Echo: EF 45-50%.  . Myocardial infarction ([place])    2006  . Obesity   . Orthopnea   . Orthostatic hypotension    a. 11/2016 -> meds reduced;  b. 01/2017 admitted w/ presyncope and orthostasis->Meds held.  . Oxygen deficit    a. Wears 2 lpm most of the day and @ HS.  . Presence of permanent cardiac pacemaker   . PVC (premature ventricular contraction)    a. 02/2009 s/p RFCA (J [facility]).  . Sick sinus syndrome ([place])    a. 01/2005 s/p PPM 2/2 heart block following CABG;  b. 05/2012 Gen Change: MDT Adapta ADDRL1 DC PPM, ser # [id number].  . Sleep apnea    a. Intolerant of CPAP  . Type II diabetes mellitus [facility])     HOSPITAL COURSE:   #1 Near syncope- was admitted last month for the same. Noted to  have significant orthostases at that time and his BP meds were discontinued at discharge and also was started on midodrine. - Was on low dose losartan and torsemide at home again. don't see midodrine or florinef ordered - Orthostatic vitals-  Still dropping.- Consulted cardio- hold all antihypertensive meds for now, resume and then increased to 15 mg TID midodrine.   No florinef due to fluid retention. Still cont to have drop in BP , so added pseudofed and stop flomax.   Increased dose of pseudofed today, add abdominal binder, thigh high elastic stockings.  - also carotid dopplers with >70% left carotid stenosis-the following with the vascular surgeon for that. Discussed with on-call vascular surgeon who recommended outpatient follow-up for an angiogram if needed  because patient is symptomatic. Patient cannot get a CT angiogram due to his CK D and cannot get an MRA due to his pacemaker. - PT consulted  #2 Chronic combined systolic and diastolic- stable, decrease the dose of torsemide. On low dose losartan.Echo with EF of 45-50%. -Follows with cardiology as outpatient. Also has a pacemaker.  #3 atrial fibrillation with sick sinus syndrome-status post pacemaker. Also on amiodarone. -On eliquis for anticoagulation  #4 hyperlipidemia-continue statin.  #5GERD-Protonix  #6 urinary retention and acute-Foley was placed in the emergency room, discontinue Foley catheter and monitor. Patient already on Proscar. Stopped flomax again- due to hypotension.   Changed proscar dose to evening.  #7 Weakness    PT suggested SNF, but pt and his wife choose not to go to SNF, but [facility] and PT.   But as he continued to feels very weak, finally agreed on SNF.  DISCHARGE CONDITIONS:   Stable.  CONSULTS OBTAINED:  Treatment Team:  Minna Merritts, MD  DRUG ALLERGIES:   Allergies  Allergen Reactions  . Shellfish Allergy Anaphylaxis    DISCHARGE MEDICATIONS:   Current Discharge Medication List    START taking these medications   Details  midodrine (PROAMATINE) 5 MG tablet Take 3 tablets (15 mg total) by mouth 3 (three) times daily with meals. Qty: 90 tablet, Refills: 1    pseudoephedrine (SUDAFED) 60 MG tablet Take 1 tablet (60 mg total) by mouth every 6 (six) hours. Qty: 80 tablet, Refills: 1      CONTINUE these medications which have CHANGED   Details  finasteride (PROSCAR) 5 MG tablet Take 1 tablet (5 mg total) by mouth daily after supper. Qty: 30 tablet, Refills: 0      CONTINUE these medications which have NOT CHANGED   Details  albuterol (PROVENTIL) (2.5 MG/3ML) 0.083% nebulizer solution Take 3 mLs (2.5 mg total) by nebulization every 6 (six) hours as needed for wheezing or shortness of breath. Qty: 75 mL, Refills: 0    amiodarone  (PACERONE) 200 MG tablet take 1 tablet by mouth once daily Qty: 30 tablet, Refills: 3    calcitRIOL (ROCALTROL) 0.5 MCG capsule take 1 capsule by mouth once daily Qty: 90 capsule, Refills: 3    cholecalciferol (VITAMIN D) 1000 UNITS tablet Take 1,000 Units by mouth daily.    docusate sodium (COLACE) 100 MG capsule Take 100 mg by mouth 2 (two) times daily.      ELIQUIS 5 MG TABS tablet take 1 tablet by mouth twice a day Qty: 60 tablet, Refills: 11    insulin lispro protamine-lispro (HUMALOG MIX 75/25) (75-25) 100 UNIT/ML SUSP injection Inject 5 Units into the skin 2 (two) times daily with a meal. Qty: 10 mL, Refills: 0    Multiple Vitamins-Minerals (  MULTI-VITAMIN GUMMIES) CHEW Chew by mouth daily.    omeprazole (PRILOSEC) 20 MG capsule Take 20 mg by mouth daily. Monday, Wednesday, friday    polyethylene glycol (MIRALAX / GLYCOLAX) packet Take 17 g by mouth daily as needed for mild constipation. Qty: 14 each, Refills: 0    rosuvastatin (CRESTOR) 20 MG tablet take 1 tablet by mouth at bedtime Qty: 90 tablet, Refills: 3    triamcinolone (NASACORT ALLERGY 24HR) 55 MCG/ACT AERO nasal inhaler Place 2 sprays into the nose daily.    ACCU-CHEK FASTCLIX LANCETS MISC TEST twice a day if needed Qty: 102 each, Refills: 12    ACCU-CHEK SMARTVIEW test strip TEST twice a day Qty: 300 each, Refills: 12    BD INSULIN SYRINGE ULTRAFINE 31G X 5/16" 0.3 ML MISC use twice a day as directed Qty: 100 each, Refills: 12    OXYGEN Inhale 1.5 L into the lungs.      STOP taking these medications     losartan (COZAAR) 25 MG tablet      torsemide (DEMADEX) 20 MG tablet          DISCHARGE INSTRUCTIONS:    Follow with cardio and vascular clinic in 2 weeks.  If you experience worsening of your admission symptoms, develop shortness of breath, life threatening emergency, suicidal or homicidal thoughts you must seek medical attention immediately by calling 911 or calling your MD immediately  if  symptoms less severe.  You Must read complete instructions/literature along with all the possible adverse reactions/side effects for all the Medicines you take and that have been prescribed to you. Take any new Medicines after you have completely understood and accept all the possible adverse reactions/side effects.   Please note  You were cared for by a hospitalist during your hospital stay. If you have any questions about your discharge medications or the care you received while you were in the hospital after you are discharged, you can call the unit and asked to speak with the hospitalist on call if the hospitalist that took care of you is not available. Once you are discharged, your primary care physician will handle any further medical issues. Please note that NO REFILLS for any discharge medications will be authorized once you are discharged, as it is imperative that you return to your primary care physician (or establish a relationship with a primary care physician if you do not have one) for your aftercare needs so that they can reassess your need for medications and monitor your lab values.    Today   CHIEF COMPLAINT:   Chief Complaint  Patient presents with  . Fall  . Weakness  . Hypotension    HISTORY OF PRESENT ILLNESS:  Marco [name]  is a 81 y.o. male presents with Near syncopal episode followed by subsequent weakness. Patient presents to the ED with shortness of breath as well. He had some mild hypoxia on admission to the ED, but he states that he is typically on 2 L of oxygen at home. His O2 sats corrected with supplemental oxygen via nasal cannula   VITAL SIGNS:  Blood pressure (!) 155/67, pulse 74, temperature 98 F (36.7 C), temperature source Oral, resp. rate 18, height 6' (1.829 m), weight 109.1 kg (240 lb 9.6 oz), SpO2 95 %.  I/O:   Intake/Output Summary (Last 24 hours) at 03/20/17 1447 Last data filed at 03/20/17 1329  Gross per 24 hour  Intake  1515 ml  Output              500 ml  Net             1015 ml    PHYSICAL EXAMINATION:   GENERAL:  81 y.o.-year-old patient lying in the bed with no acute distress.  EYES: Pupils equal, round, reactive to light and accommodation. No scleral icterus. Extraocular muscles intact.  HEENT: Head atraumatic, normocephalic. Oropharynx and nasopharynx clear.  NECK:  Supple, no jugular venous distention. No thyroid enlargement, no tenderness.  LUNGS: Normal breath sounds bilaterally, no wheezing, rales,rhonchi or crepitation. No use of accessory muscles of respiration.  CARDIOVASCULAR: S1, S2 normal. No rubs, or gallops. 2/6 systolic murmur present, has a pacemaker in place ABDOMEN: Soft, nontender, nondistended. Bowel sounds present. No organomegaly or mass.  EXTREMITIES: No pedal edema, cyanosis, or clubbing.  NEUROLOGIC: Cranial nerves II through XII are intact. Muscle strength 4/5 in all extremities. Sensation intact. Gait not checked.  PSYCHIATRIC: The patient is alert and oriented x 3.  SKIN: No obvious rash, lesion, or ulcer.    DATA REVIEW:   CBC  Recent Labs Lab 03/20/17 0523  WBC 6.5  HGB 11.2*  HCT 33.3*  PLT 138*    Chemistries   Recent Labs Lab 03/16/17 0614  03/19/17 1229  NA 139  < > 135  K 3.3*  < > 4.3  CL 97*  < > 95*  CO2 35*  < > 31  GLUCOSE 190*  < > 172*  BUN 41*  < > 50*  CREATININE 2.80*  < > 2.67*  CALCIUM 9.2  < > 9.4  MG 2.2  --   --   < > = values in this interval not displayed.  Cardiac Enzymes  Recent Labs Lab 03/15/17 1147  TROPONINI <0.03    Microbiology Results  Results for orders placed or performed during the hospital encounter of 02/01/17  Urine culture     Status: Abnormal   Collection Time: 02/01/17 10:11 PM  Result Value Ref Range Status   Specimen Description URINE, CLEAN CATCH  Final   Special Requests Normal  Final   Culture MULTIPLE SPECIES PRESENT, SUGGEST RECOLLECTION (A)  Final   Report Status 02/03/2017 FINAL   Final    RADIOLOGY:  No results found.  EKG:   Orders placed or performed during the hospital encounter of 03/15/17  . ED EKG  . ED EKG  . EKG 12-Lead  . EKG 12-Lead  . ED EKG  . ED EKG      Management plans discussed with the patient, family and they are in agreement.  CODE STATUS:     Code Status Orders        Start     Ordered   03/15/17 1620  Full code  Continuous     03/15/17 1619    Code Status History    Date Active Date Inactive Code Status Order ID Comments User Context   02/02/2017  1:52 AM 02/07/2017  6:50 PM Full Code [id number]  Hugelmeyer, Ubaldo Glassing, DO Inpatient   06/10/2016  9:09 PM 06/12/2016  3:20 PM Full Code [id number]  Idelle Crouch, MD Inpatient   10/28/2015 11:14 AM 10/29/2015  3:01 PM Full Code [id number]  Hillary Bow, MD ED    Advance Directive Documentation     Most Recent Value  Type of Advance Directive  Healthcare Power of Attorney, Living will  Pre-existing out of facility DNR order (yellow form or pink  MOST form)  -  "MOST" Form in Place?  -      TOTAL TIME TAKING CARE OF THIS PATIENT: 35 minutes.    Vaughan Basta M.D on 03/20/2017 at 2:47 PM  Between 7am to 6pm - Pager - [phone]  After 6pm go to [email or web address] - password [email or web address] [place] [facility]  Office  [phone]  CC: Primary care physician; [name], MD   Note: This dictation was prepared with Dragon dictation along with smaller phrase technology. Any transcriptional errors that result from this process are unintentional.

## 2017-03-20 NOTE — Discharge Instructions (Signed)
Need to wear thigh high elastic stockings to prevent severe drop in orthostatic blood pressure on standing up.

## 2017-03-20 NOTE — Progress Notes (Signed)
PT Cancellation Note  Patient Details Name: Marco Tassin Sr. MRN: [id number] DOB: [birth year]   Cancelled Treatment:    Reason Eval/Treat Not Completed: Fatigue/lethargy limiting ability to participate.  Pt sleeping in bed upon PT arrival.  Pt woken with vc's and pt reporting recently being up to bedside commode and was too fatigued (from recent activities) to participate in physical therapy at the moment.  Will re-attempt PT eval at a later date/time.  Leitha Bleak, PT 03/20/17, 2:59 PM [phone]

## 2017-03-20 NOTE — NC FL2 (Signed)
[place] LEVEL OF CARE SCREENING TOOL     IDENTIFICATION  Patient Name: Marco Schopf Sr. Birthdate: 03/11/1936 Sex: male Admission Date (Current Location): 03/15/2017  [place] and [place] Number:  Engineering geologist and Address:  [facility], [address], [place], [place] [address]      Provider Number: [id number]  Attending Physician Name and Address:  Vaughan Basta, *  Relative Name and Phone Number:  Jeramiah, [place] [phone]  [phone]     Current Level of Care: Hospital Recommended Level of Care: [place] Prior Approval Number:    Date Approved/Denied:   PASRR Number: [id number]  Discharge Plan: SNF    Current Diagnoses: Patient Active Problem List   Diagnosis Date Noted  . Near syncope 02/02/2017  . Pressure injury of skin 02/02/2017  . Orthostasis 12/04/2016  . LGI bleed 06/10/2016  . Acute blood loss anemia 06/10/2016  . Chronic respiratory failure with hypoxia, on home O2 therapy ([place]) 06/10/2016  . PAD (peripheral artery disease) ([place]) 06/05/2016  . Chest pain 10/28/2015  . Gastroenteritis   . Diarrhea of infectious origin   . Emesis   . S/P CABG (coronary artery bypass graft)   . Coronary artery disease involving native coronary artery of native heart without angina pectoris   . PAF (paroxysmal atrial fibrillation) ([place])   . Absolute anemia 01/28/2015  . Atherosclerosis of coronary artery 01/28/2015  . Barrett's esophagus 01/28/2015  . Basal cell carcinoma of face 01/28/2015  . Benign fibroma of prostate 01/28/2015  . Diabetes ([place]) 01/28/2015  . Gastro-esophageal reflux disease without esophagitis 01/28/2015  . Irritable colon 01/28/2015  . Pleural cavity effusion 01/28/2015  . Apnea, sleep 01/28/2015  . Hematuria 12/21/2014  . Left hand weakness 09/23/2013  . Essential hypertension 06/23/2013  . Chronic combined systolic and diastolic CHF (congestive  heart failure) ([place]) 06/10/2013  . Hypotension, postural 06/10/2013  . Hyperkalemia 06/10/2013  . Adjustment of cardiac pacemaker 06/10/2013  . Atrial fibrillation and flutter ([place]) 10/22/2012  . Pacemaker-Medtronic 06/19/2012  . Pulmonary hypertension ([place]) 03/18/2012  . Occlusion and stenosis of carotid artery without mention of cerebral infarction 02/08/2012  . Morbid obesity ([place]) 08/05/2010  . Sinoatrial node dysfunction (HCC) 06/02/2010  . Stage 4 chronic renal impairment associated with type 2 diabetes mellitus ([place]) 08/09/2009  . AAA 04/21/2009  . Ischemic cardiomyopathy  s/p CABG 11/23/2008  . MITRAL REGURGITATION 11/22/2008  . CAD (coronary artery disease) 11/22/2008  . Hyperlipidemia 03/02/2008  . ALLERGIC RHINITIS 03/02/2008  . OSA (obstructive sleep apnea) 03/02/2008    Orientation RESPIRATION BLADDER Height & Weight     Self, Time, Situation, Place  O2 (3L) Continent Weight: 240 lb 9.6 oz (109.1 kg) Height:  6' (182.9 cm)  BEHAVIORAL SYMPTOMS/MOOD NEUROLOGICAL BOWEL NUTRITION STATUS      Continent Diet (Carb Modified)  AMBULATORY STATUS COMMUNICATION OF NEEDS Skin   Limited Assist Verbally PU Stage and Appropriate Care   PU Stage 2 Dressing:  (PRN dressing change)                   Personal Care Assistance Level of Assistance  Bathing, Feeding, Dressing Bathing Assistance: Limited assistance Feeding assistance: Independent Dressing Assistance: Limited assistance     Functional Limitations Info  Sight, Hearing, Speech Sight Info: Adequate Hearing Info: Adequate Speech Info: Adequate    SPECIAL CARE FACTORS FREQUENCY  PT (By licensed PT)     PT Frequency: 5x a week  Contractures Contractures Info: Not present    Additional Factors Info  Code Status, Allergies, Insulin Sliding Scale Code Status Info: Full Code Allergies Info: Shellfish   Insulin Sliding Scale Info: insulin aspart (novoLOG) injection 0-5 Units 3x a day with  meals       Current Medications (03/20/2017):  This is the current hospital active medication list Current Facility-Administered Medications  Medication Dose Route Frequency Provider Last Rate Last Dose  . acetaminophen (TYLENOL) tablet 650 mg  650 mg Oral Q6H PRN Lance Coon, MD       Or  . acetaminophen (TYLENOL) suppository 650 mg  650 mg Rectal Q6H PRN Lance Coon, MD      . amiodarone (PACERONE) tablet 200 mg  200 mg Oral Daily Lance Coon, MD   200 mg at 03/20/17 1033  . apixaban (ELIQUIS) tablet 5 mg  5 mg Oral BID Lance Coon, MD   5 mg at 03/20/17 1032  . calcitRIOL (ROCALTROL) capsule 0.5 mcg  0.5 mcg Oral Q breakfast Lance Coon, MD   0.5 mcg at 03/20/17 0809  . cholecalciferol (VITAMIN D) tablet 1,000 Units  1,000 Units Oral Daily Lance Coon, MD   1,000 Units at 03/20/17 1033  . docusate sodium (COLACE) capsule 100 mg  100 mg Oral BID Lance Coon, MD   100 mg at 03/20/17 1032  . finasteride (PROSCAR) tablet 5 mg  5 mg Oral Daily Vaughan Basta, MD   5 mg at 03/20/17 1100  . insulin aspart (novoLOG) injection 0-5 Units  0-5 Units Subcutaneous QHS Lance Coon, MD   2 Units at 03/15/17 2210  . insulin aspart (novoLOG) injection 0-9 Units  0-9 Units Subcutaneous TID WC Lance Coon, MD   2 Units at 03/20/17 [phone]  . midodrine (PROAMATINE) tablet 15 mg  15 mg Oral TID WC Minna Merritts, MD   15 mg at 03/20/17 1133  . neomycin-bacitracin-polymyxin (NEOSPORIN) ointment   Topical PRN Hugelmeyer, Alexis, DO   1 application at 82/70/78 2210  . ondansetron (ZOFRAN) tablet 4 mg  4 mg Oral Q6H PRN Lance Coon, MD       Or  . ondansetron [facility]) injection 4 mg  4 mg Intravenous Q6H PRN Lance Coon, MD      . pantoprazole (PROTONIX) EC tablet 40 mg  40 mg Oral QAC breakfast Lance Coon, MD   40 mg at 03/20/17 0810  . polyethylene glycol (MIRALAX / GLYCOLAX) packet 17 g  17 g Oral Daily PRN Lance Coon, MD      . pseudoephedrine (SUDAFED) tablet 60 mg  60 mg  Oral Q6H Vaughan Basta, MD   60 mg at 03/20/17 6754  . rosuvastatin (CRESTOR) tablet 10 mg  10 mg Oral QHS Gladstone Lighter, MD   10 mg at 03/19/17 2135  . zolpidem (AMBIEN) tablet 5 mg  5 mg Oral QHS PRN Hugelmeyer, Alexis, DO   5 mg at 03/19/17 2219     Discharge Medications: Please see discharge summary for a list of discharge medications.  Relevant Imaging Results:  Relevant Lab Results:   Additional Information SSN [id number]  Ross Ludwig, [place]

## 2017-03-20 NOTE — Progress Notes (Signed)
Pt to be discharged to [facility] room 201 b.iv and tele removed. Wife notified. Pt to transport via ems.

## 2017-03-20 NOTE — Clinical Social Work Note (Addendum)
CSW spoke with patient and he has changed his mind and has agreed to go to SNF for short term rehab.  CSW was given permission to begin bed search in [facility] for SNF placement.  CSW to continue to follow patient's progress throughout discharge planning.  2:15pm  CSW gave bed offers to patient and his wife, they chose [facility]  CSW contacted [facility] who said they can accept patient today if he is medically ready for discharge and orders have been received.  CSW reminded patient's wife that he only has 4 days left for him to not have a copay, CSW advised patient's wife they may want to consider applying for Medicaid and/or a medicare supplement plan to help with his copays in the future.  Patient to be d/c'ed today to [facility].  Patient and family agreeable to plans will transport via ems RN to call report to room 201B [phone].  Jones Broom. [place], MSW, [place]  03/20/2017 2:05 PM

## 2017-03-20 NOTE — Clinical Social Work Placement (Signed)
   CLINICAL SOCIAL WORK PLACEMENT  NOTE  Date:  03/20/2017  Patient Details  Name: Marco [name] [facility] Sr. MRN: [id number] Date of Birth: 12/11/1935  Clinical Social Work is seeking post-discharge placement for this patient at the [place] level of care (*CSW will initial, date and re-position this form in  chart as items are completed):  Yes   Patient/family provided with [place] Work Department's list of facilities offering this level of care within the geographic area requested by the patient (or if unable, by the patient's family).  Yes   Patient/family informed of their freedom to choose among providers that offer the needed level of care, that participate in Medicare, Medicaid or managed care program needed by the patient, have an available bed and are willing to accept the patient.  Yes   Patient/family informed of [facility]'s ownership interest in [facility] and [facility], as well as of the fact that they are under no obligation to receive care at these facilities.  PASRR submitted to EDS on 03/20/17     PASRR number received on       Existing PASRR number confirmed on 03/20/17     FL2 transmitted to all facilities in geographic area requested by pt/family on 03/20/17     FL2 transmitted to all facilities within larger geographic area on       Patient informed that his/her managed care company has contracts with or will negotiate with certain facilities, including the following:        Yes   Patient/family informed of bed offers received.  Patient chooses bed at [facility]     Physician recommends and patient chooses bed at      Patient to be transferred to [facility] on 03/20/17.  Patient to be transferred to facility by [facility]     Patient family notified on 03/20/17 of transfer.  Name of family member notified:  Damare Serano patient's wife.     PHYSICIAN Please sign FL2     Additional  Comment:    _______________________________________________ Ross Ludwig, LCSWA 03/20/2017, 4:02 PM

## 2017-03-21 ENCOUNTER — Other Ambulatory Visit: Payer: Self-pay | Admitting: *Deleted

## 2017-03-21 DIAGNOSIS — I482 Chronic atrial fibrillation: Secondary | ICD-10-CM | POA: Diagnosis not present

## 2017-03-21 DIAGNOSIS — I5042 Chronic combined systolic (congestive) and diastolic (congestive) heart failure: Secondary | ICD-10-CM | POA: Diagnosis not present

## 2017-03-21 DIAGNOSIS — E118 Type 2 diabetes mellitus with unspecified complications: Secondary | ICD-10-CM | POA: Diagnosis not present

## 2017-03-21 DIAGNOSIS — G909 Disorder of the autonomic nervous system, unspecified: Secondary | ICD-10-CM | POA: Diagnosis not present

## 2017-03-21 DIAGNOSIS — G903 Multi-system degeneration of the autonomic nervous system: Secondary | ICD-10-CM | POA: Diagnosis not present

## 2017-03-21 NOTE — Patient Outreach (Signed)
[name]) Care Management  03/21/2017  Marco Wahba Sr. 10/31/1935 [id number]  Patient admitted to [facility] on 7/19 Discharged to [facility] place for rehab on 7/24.    Plan Placed LCSW consult for follow up at facility.    [name], RN, [place] Management [phone]- Mobile [phone]- Toll Free Main Office

## 2017-03-22 ENCOUNTER — Other Ambulatory Visit
Admission: RE | Admit: 2017-03-22 | Discharge: 2017-03-22 | Disposition: A | Discharge status: Home / Self Care | Payer: Medicare Other | Source: Ambulatory Visit | Attending: Internal Medicine | Admitting: Internal Medicine

## 2017-03-22 ENCOUNTER — Non-Acute Institutional Stay (SKILLED_NURSING_FACILITY): Payer: Medicare Other | Admitting: Gerontology

## 2017-03-22 DIAGNOSIS — E559 Vitamin D deficiency, unspecified: Secondary | ICD-10-CM | POA: Diagnosis not present

## 2017-03-22 DIAGNOSIS — E8809 Other disorders of plasma-protein metabolism, not elsewhere classified: Secondary | ICD-10-CM

## 2017-03-22 DIAGNOSIS — L97519 Non-pressure chronic ulcer of other part of right foot with unspecified severity: Secondary | ICD-10-CM

## 2017-03-22 DIAGNOSIS — E46 Unspecified protein-calorie malnutrition: Secondary | ICD-10-CM

## 2017-03-22 DIAGNOSIS — I951 Orthostatic hypotension: Secondary | ICD-10-CM | POA: Insufficient documentation

## 2017-03-22 DIAGNOSIS — T148XXA Other injury of unspecified body region, initial encounter: Secondary | ICD-10-CM | POA: Diagnosis not present

## 2017-03-22 DIAGNOSIS — L97522 Non-pressure chronic ulcer of other part of left foot with fat layer exposed: Secondary | ICD-10-CM

## 2017-03-22 LAB — HEMOGLOBIN A1C
Hgb A1c MFr Bld: 6.1 % — ABNORMAL HIGH (ref 4.8–5.6)
Mean Plasma Glucose: 128 mg/dL

## 2017-03-22 LAB — CORTISOL: CORTISOL PLASMA: 8.9 ug/dL

## 2017-03-23 ENCOUNTER — Other Ambulatory Visit
Admission: RE | Admit: 2017-03-23 | Discharge: 2017-03-23 | Disposition: A | Discharge status: Home / Self Care | Payer: Medicare Other | Source: Skilled Nursing Facility | Attending: Gerontology | Admitting: Gerontology

## 2017-03-23 ENCOUNTER — Other Ambulatory Visit: Payer: Self-pay | Admitting: *Deleted

## 2017-03-23 DIAGNOSIS — R55 Syncope and collapse: Secondary | ICD-10-CM | POA: Insufficient documentation

## 2017-03-23 DIAGNOSIS — E119 Type 2 diabetes mellitus without complications: Secondary | ICD-10-CM | POA: Insufficient documentation

## 2017-03-23 LAB — CBC WITH DIFFERENTIAL/PLATELET
Basophils Absolute: 0.1 10*3/uL (ref 0–0.1)
Basophils Relative: 1 %
Eosinophils Absolute: 0.4 10*3/uL (ref 0–0.7)
Eosinophils Relative: 5 %
HCT: 34.7 % — ABNORMAL LOW (ref 40.0–52.0)
HEMOGLOBIN: 11.6 g/dL — AB (ref 13.0–18.0)
LYMPHS ABS: 1.2 10*3/uL (ref 1.0–3.6)
LYMPHS PCT: 17 %
MCH: 31.5 pg (ref 26.0–34.0)
MCHC: 33.4 g/dL (ref 32.0–36.0)
MCV: 94.3 fL (ref 80.0–100.0)
MONOS PCT: 11 %
Monocytes Absolute: 0.8 10*3/uL (ref 0.2–1.0)
NEUTROS PCT: 66 %
Neutro Abs: 4.8 10*3/uL (ref 1.4–6.5)
Platelets: 180 10*3/uL (ref 150–440)
RBC: 3.68 MIL/uL — AB (ref 4.40–5.90)
RDW: 15.1 % — ABNORMAL HIGH (ref 11.5–14.5)
WBC: 7.2 10*3/uL (ref 3.8–10.6)

## 2017-03-23 LAB — COMPREHENSIVE METABOLIC PANEL
ALK PHOS: 54 U/L (ref 38–126)
ALT: 13 U/L — AB (ref 17–63)
AST: 16 U/L (ref 15–41)
Albumin: 2.8 g/dL — ABNORMAL LOW (ref 3.5–5.0)
Anion gap: 8 (ref 5–15)
BILIRUBIN TOTAL: 0.5 mg/dL (ref 0.3–1.2)
BUN: 44 mg/dL — ABNORMAL HIGH (ref 6–20)
CALCIUM: 9.9 mg/dL (ref 8.9–10.3)
CO2: 32 mmol/L (ref 22–32)
CREATININE: 2.6 mg/dL — AB (ref 0.61–1.24)
Chloride: 99 mmol/L — ABNORMAL LOW (ref 101–111)
GFR, EST AFRICAN AMERICAN: 26 mL/min — AB (ref >60)
GFR, EST NON AFRICAN AMERICAN: 22 mL/min — AB (ref >60)
Glucose, Bld: 124 mg/dL — ABNORMAL HIGH (ref 65–99)
Potassium: 4.2 mmol/L (ref 3.5–5.1)
Sodium: 139 mmol/L (ref 135–145)
TOTAL PROTEIN: 6.7 g/dL (ref 6.5–8.1)

## 2017-03-23 LAB — MAGNESIUM: MAGNESIUM: 2.2 mg/dL (ref 1.7–2.4)

## 2017-03-23 LAB — VITAMIN B12: VITAMIN B 12: 1020 pg/mL — AB (ref 180–914)

## 2017-03-23 NOTE — Patient Outreach (Signed)
[name]  03/23/2017  Marco Stief Sr. 22-Apr-1936 [id number]   Phone call to discharge planner, Marco [name] from [facility] to discuss discharge plan. Per RNCM, patient has limited coverage days and he may be going into co-pay days soon. Marco [name] was out of the office, however left instructions to contact her coverage people, Marco [name] [phone] or Marco [name] [phone].  This social worker left a message with both requesting a return call regarding patient's discharge plan.    [name] Care Management [phone]

## 2017-03-24 LAB — VITAMIN D 25 HYDROXY (VIT D DEFICIENCY, FRACTURES): Vit D, 25-Hydroxy: 29.7 ng/mL — ABNORMAL LOW (ref 30.0–100.0)

## 2017-03-26 ENCOUNTER — Encounter: Payer: Self-pay | Admitting: Gerontology

## 2017-03-26 ENCOUNTER — Other Ambulatory Visit: Payer: Self-pay | Admitting: *Deleted

## 2017-03-26 ENCOUNTER — Telehealth: Payer: Self-pay | Admitting: Cardiovascular Disease

## 2017-03-26 NOTE — Patient Outreach (Signed)
[facility]) Care Management  03/26/2017  Harpreet Pompey Tiffin Sr. April 12, 1936 [id number]   Phone call to [facility], admissions coordinator at [facility] for status update on patient's stay there. Discharge planner, Geralyn Flash is out of the office for the week. Per Sharyn Lull, patient will be staying in the facility and paying privately. The care plan meeting is scheduled for Thursday, 03/29/17 with social worker, [name] who will be covering for [employer].  Sharyn Lull suggested that I call Nira Conn Thursday afternoon to discuss the results of the care team meeting at [phone].  Plan: This social worker will follow up with [name] on Thursday afternoon for status update and to assist with discharge planning.   [name] [facility] Care Management [phone]

## 2017-03-26 NOTE — Telephone Encounter (Signed)
That's fine.  He was not hospitalized for a cardiac issue, so he can f/u in October.

## 2017-03-26 NOTE — Telephone Encounter (Signed)
Spoke to patient spouse at home  She scheduled patient for Oct 4th to see Dr Rockey Situ  Nothing further needed.

## 2017-03-26 NOTE — Telephone Encounter (Signed)
Bobbie from [place] at [place] calling stating patient was in hospital on 03/15/17 Pt was just seen in office on 03/01/17 Wanted to know if patient needed to be seen in office since we recently saw him  Please advise

## 2017-03-26 NOTE — Progress Notes (Signed)
Location:   [place] Room Number: 201B Place of Service:  SNF [phone]) Provider:  Toni Arthurs, NP-C  [name], MD  Patient Care Team: [name], MD as PCP - General (Unknown Physician Specialty) Minus Breeding, MD (Cardiology) Minna Merritts, MD as Consulting Physician (Cardiology) Alfonzo Feller, RN as [place], Darla Lesches, [place] as [place] Management  Extended Emergency Contact Information Primary Emergency Contact: Cambria,[name] Address: [address] [place], [place] [address] Johnnette Litter of [place] Phone: [phone] Mobile Phone: [phone] Relation: Spouse  Code Status:  FULL Goals of care: Advanced Directive information Advanced Directives 03/26/2017  Does Patient Have a Medical Advance Directive? No  Type of Advance Directive -  Does patient want to make changes to medical advance directive? -  Copy of [place] in Chart? -  Would patient like information on creating a medical advance directive? -     Chief Complaint  Patient presents with  . Acute Visit    Follow up on wounds    HPI:  Pt is a 81 y.o. male seen today for an acute visit for evaluation of multiple wounds. Pt has 1 wound on the tip of the Right second toe that is superficial. He has another wound on the tip of the Left second toe that has fatty tissue exposure. These wounds are being followed by Dr Cleda Mccreedy, Podiatrist. They are being treated with Bactroban and a light dressing by Dr Cleda Mccreedy. No redness, minimal tenderness, no drainage. Right- appears dry. Left- does have periwound maceration and erythema. Will temporarily adjust wound care regimen for the Left toe d/t the maceration/ excess moisture. The third wound is on the pt's left chest/ pectoral area. He reports it is a skin cancer than won't heal. The wound has a dry, scaly scab on the surface. No  redness, no peri-wound erythema. Non-tender. Will attempt to enzymatically debride chest wound for improved healing. Otherwise, VSS. No other complaints.     Past Medical History:  Diagnosis Date  . AAA (abdominal aortic aneurysm)repaired    a. 12/2006 s/p repair of 6cm infrarenal AAA with 6m Dacron graft.  . Anemia   . Anginal pain ([place]   . Atrial fibrillation/Flutter    a. Dx 2014;  b. 10/2012 s/p TEE/DCCV;  c. On amio and Eliquis (CHA2DS2VASc = 6).  . Basal cell carcinoma    a. 2013 x 2 back and left neck  . BPH (benign prostatic hypertrophy)   . Carotid artery occlusion    a. 11/2008 s/p R CEA; b. 01/2017 Carotid U/S: RICA <<85% LICA >>88%  . Chronic systolic heart failure ([place]    a. 02/2012 Echo: EF 30-35%, Pulm HTN, and modest RV dysfunction;  b. 10/2012 TEE: EF 25-30%, mild conc LVH, diff HK, mod RV dysfxn, mod dil RA/LA, mild MR, no MS, nl MVR, mod TR, Ao sclerosis; c. 01/2017 Echo: EF 45-50%, diff HK, triv MR, mod dil LA.  . CKD (chronic kidney disease), stage IV ([place]   . Coronary artery disease    a. 1999 PCI RCA;  b. 01/2005 NSTEMI: CABG x 3 (LIMA->LAD, VG->OM, VG->LPL) @ time of MVR.  . Diabetes mellitus, insulin dependent (IDDM), controlled ([place]   . DVT (deep venous thrombosis) ([place]   . GERD (gastroesophageal reflux disease)   . Heart disease   . History of hiatal hernia   .  History of shingles   . Hyperlipidemia   . Hyperlipidemia, unspecified   . Hypertension   . Hypertensive heart disease   . Mitral regurgitation--s/p repair    a. 01/2005 MVR: 28-mm Edwards ET Logix ring annuloplasty; b. 10/2012 TEE: normal fxning MV ring with mild MR, no MS; 01/2017 Echo: Triv MR.  . Mixed Ischemic and Non-ischemic Cardiomyopathy    a. 02/2012 Echo: EF 30-35%, Pulm HTN, and modest RV dysfunction;  b. 10/2012 TEE: EF 25-30%, mild conc LVH, diff HK, mod RV dysfxn, mod dil RA/LA, mild MR, no MS, nl MVR, mod TR, Ao sclerosis; c. 01/2017 Echo: EF 45-50%.  . Myocardial infarction ([place])    2006   . Neuropathy    SOME  . Obesity   . Onychocryptosis   . Onychomycosis   . Orthopnea   . Orthostatic hypotension    a. 11/2016 -> meds reduced;  b. 01/2017 admitted w/ presyncope and orthostasis->Meds held.  . Oxygen deficit    a. Wears 2 lpm most of the day and @ HS.  . Paresthesias    in the feet  . Paronychia of third toe, right    contusion with paronychia right third toe  . Presence of permanent cardiac pacemaker   . PVC (premature ventricular contraction)    a. 02/2009 s/p RFCA (J [place]).  . Sick sinus syndrome ([place])    a. 01/2005 s/p PPM 2/2 heart block following CABG;  b. 05/2012 Gen Change: MDT Adapta ADDRL1 DC PPM, ser # [id number].  . Sleep apnea    a. Intolerant of CPAP  . Type II diabetes mellitus ([place])    Past Surgical History:  Procedure Laterality Date  . ABDOMINAL AORTIC ANEURYSM REPAIR      May 2008,   . CARDIAC CATHETERIZATION  2006   @ [facility]  . CATARACT EXTRACTION     right  . CATARACT EXTRACTION W/PHACO Left 10/04/2016   Procedure: CATARACT EXTRACTION PHACO AND INTRAOCULAR LENS PLACEMENT (IOC);  Surgeon: Estill Cotta, MD;  Location: [facility] ORS;  Service: Ophthalmology;  Laterality: Left;  [place] 01:21 AP% 25.9 CDE 39.08 Fluid pack lot # [id number]   . CORONARY ARTERY BYPASS GRAFT     CABG with a  LIMA to the LAD, SVG to circumflex, SVG to posterolateral [id number].  Randolm Idol / REPLACE / REMOVE PACEMAKER  2006   Dr. [name]  . MITRAL VALVE REPAIR  2006    mitral valve repair with a #28 Edwards logic ring  . PACEMAKER PLACEMENT    . PERMANENT PACEMAKER GENERATOR CHANGE N/A 06/26/2012   Procedure: PERMANENT PACEMAKER GENERATOR CHANGE;  Surgeon: Deboraha Sprang, MD;  Location: [facility] CATH LAB;  Service: Cardiovascular;  Laterality: N/A;  . SKIN BIOPSY     left wrist; right elbow  . SKIN CANCER EXCISION      x 6  . SKIN CANCER EXCISION    . TRANSESOPHAGEAL ECHOCARDIOGRAM    . VASECTOMY      Allergies  Allergen Reactions  . Shellfish Allergy Anaphylaxis     Allergies as of 03/22/2017      Reactions   Shellfish Allergy Anaphylaxis      Medication List       Accurate as of 03/22/17 11:59 PM. Always use your most recent med list.          ACCU-CHEK FASTCLIX LANCETS Misc TEST twice a day if needed   ACCU-CHEK SMARTVIEW test strip Generic drug:  glucose blood TEST twice a day   albuterol (2.5  MG/3ML) 0.083% nebulizer solution Commonly known as:  PROVENTIL Take 3 mLs (2.5 mg total) by nebulization every 6 (six) hours as needed for wheezing or shortness of breath.   amiodarone 200 MG tablet Commonly known as:  PACERONE take 1 tablet by mouth once daily   bacitracin 500 UNIT/GM ointment Apply 1 application topically daily. Apply to tip of 2nd toe   BD INSULIN SYRINGE ULTRAFINE 31G X 5/16" 0.3 ML Misc Generic drug:  Insulin Syringe-Needle U-100 use twice a day as directed   cholecalciferol 1000 units tablet Commonly known as:  VITAMIN D Take 1,000 Units by mouth daily.   dextrose 40 % Gel Commonly known as:  GLUTOSE Take 1 Tube by mouth once as needed for low blood sugar. Give 1 tube every 30 minutes orally as needed until BS > 70 and pt is still responsive.   docusate sodium 100 MG capsule Commonly known as:  COLACE Take 100 mg by mouth 2 (two) times daily.   ELIQUIS 5 MG Tabs tablet Generic drug:  apixaban take 1 tablet by mouth twice a day   feeding supplement (PRO-STAT SUGAR FREE 64) Liqd Take 30 mLs by mouth 2 (two) times daily between meals.   finasteride 5 MG tablet Commonly known as:  PROSCAR Take 1 tablet (5 mg total) by mouth daily after supper.   GLUCAGON EMERGENCY 1 MG injection Generic drug:  glucagon Inject 1 mg into the vein once as needed. Give 1 mg for hypoglycemia (symptomatic) not responding to oral glucose, snack. May repeat x 1 after 15 minutes. USE ONLY IF PT IS OR IS BECOMING UNRESPONSIVE.   GLUCERNA Liqd Take 1 Can by mouth 2 (two) times daily between meals.   insulin lispro  protamine-lispro (75-25) 100 UNIT/ML Susp injection Commonly known as:  HUMALOG MIX 75/25 Inject 5 Units into the skin 2 (two) times daily with a meal.   midodrine 5 MG tablet Commonly known as:  PROAMATINE Take 3 tablets (15 mg total) by mouth 3 (three) times daily with meals.   MULTIVITAL tablet Take 1 tablet by mouth daily.   NASACORT ALLERGY 24HR 55 MCG/ACT Aero nasal inhaler Generic drug:  triamcinolone Place 2 sprays into the nose daily.   omeprazole 20 MG capsule Commonly known as:  PRILOSEC Take 20 mg by mouth daily. Monday, Wednesday, friday   OXYGEN Inhale 3 L into the lungs continuous.   polyethylene glycol packet Commonly known as:  MIRALAX / GLYCOLAX Take 17 g by mouth daily as needed for mild constipation.   pseudoephedrine 60 MG tablet Commonly known as:  SUDAFED Take 1 tablet (60 mg total) by mouth every 6 (six) hours.   rosuvastatin 20 MG tablet Commonly known as:  CRESTOR take 1 tablet by mouth at bedtime   zolpidem 5 MG tablet Commonly known as:  AMBIEN Take 5 mg by mouth at bedtime as needed for sleep.       Review of Systems  Constitutional: Negative for activity change, appetite change, chills, diaphoresis and fever.  HENT: Negative for congestion, sneezing, sore throat, trouble swallowing and voice change.   Respiratory: Negative for apnea, cough, choking, chest tightness, shortness of breath and wheezing.   Cardiovascular: Negative for chest pain, palpitations and leg swelling.  Gastrointestinal: Negative.  Negative for abdominal distention, abdominal pain, constipation, diarrhea and nausea.  Genitourinary: Negative.  Negative for difficulty urinating, dysuria, frequency and urgency.  Musculoskeletal: Negative for back pain, gait problem and myalgias. Arthralgias: typical arthritis.  Skin: Positive for wound. Negative  for color change, pallor and rash.  Neurological: Positive for weakness. Negative for dizziness, tremors, syncope, speech  difficulty, numbness and headaches.  Psychiatric/Behavioral: Negative for agitation and behavioral problems.  All other systems reviewed and are negative.   Immunization History  Administered Date(s) Administered  . Influenza Split 06/28/2012  . Influenza, High Dose Seasonal PF 05/29/2016  . Influenza,inj,Quad PF,36+ Mos 06/21/2015  . Influenza-Unspecified 05/28/2013  . Pneumococcal Conjugate-13 06/28/2012  . Pneumococcal Polysaccharide-23 11/24/2014  . Td 03/06/2017   Pertinent  Health Maintenance Due  Topic Date Due  . URINE MICROALBUMIN  08/23/1946  . INFLUENZA VACCINE  03/28/2017  . OPHTHALMOLOGY EXAM  08/18/2017  . HEMOGLOBIN A1C  09/16/2017  . FOOT EXAM  03/06/2018  . PNA vac Low Risk Adult  Completed   Fall Risk  03/05/2017 02/22/2016  Falls in the past year? Yes No  Number falls in past yr: 1 -  Injury with Fall? No -  Risk Factor Category  High Fall Risk -  Risk for fall due to : History of fall(s);Impaired balance/gait -  Follow up Falls evaluation completed;Falls prevention discussed -   Functional Status Survey:    Vitals:   03/22/17 0827  BP: (!) 143/70  Pulse: 73  Resp: 20  Temp: (!) 97.3 F (36.3 C)  SpO2: 97%  Weight: 244 lb 6.4 oz (110.9 kg)  Height: 6' (1.829 m)   Body mass index is 33.15 kg/m. Physical Exam  Constitutional: He is oriented to person, place, and time. Vital signs are normal. He appears well-developed and well-nourished. He is active and cooperative. He does not appear ill. No distress.  HENT:  Head: Normocephalic and atraumatic.  Mouth/Throat: Uvula is midline, oropharynx is clear and moist and mucous membranes are normal. Mucous membranes are not pale, not dry and not cyanotic.  Eyes: Pupils are equal, round, and reactive to light. Conjunctivae, EOM and lids are normal.  Neck: Trachea normal, normal range of motion and full passive range of motion without pain. Neck supple. No JVD present. No tracheal deviation, no edema and no  erythema present. No thyromegaly present.  Cardiovascular: Normal rate, regular rhythm, normal heart sounds and intact distal pulses.  Exam reveals no gallop and no distant heart sounds.   No murmur heard. Pulses:      Dorsalis pedis pulses are 1+ on the right side, and 1+ on the left side.  Pulmonary/Chest: Effort normal and breath sounds normal. No accessory muscle usage. No respiratory distress. He has no decreased breath sounds. He has no wheezes. He has no rhonchi. He has no rales. He exhibits no tenderness.  Abdominal: Soft. Normal appearance and bowel sounds are normal. He exhibits no distension and no ascites. There is no tenderness.  Musculoskeletal: Normal range of motion. He exhibits no edema or tenderness.  Expected osteoarthritis, stiffness  Neurological: He is alert and oriented to person, place, and time. He has normal strength.  Skin: Skin is warm and dry. He is not diaphoretic. No cyanosis. No pallor. Nails show no clubbing.     Psychiatric: He has a normal mood and affect. His speech is normal and behavior is normal. Judgment and thought content normal. Cognition and memory are normal.  Nursing note and vitals reviewed.   Labs reviewed:  Recent Labs  06/26/16 1159  02/07/17 0343 03/06/17 1141  03/16/17 0614 03/17/17 0430 03/19/17 1229 03/23/17 0600  NA 145*  < > 143 144  < > 139 137 135 139  K 4.7  < >  4.0 4.5  < > 3.3* 3.5 4.3 4.2  CL 99  < > 105 97  < > 97* 94* 95* 99*  CO2 36*  < > 34* 34*  < > 35* 35* 31 32  GLUCOSE 129*  < > 144* 105*  < > 190* 160* 172* 124*  BUN 43*  < > 47* 34*  < > 41* 43* 50* 44*  CREATININE 2.82*  < > 2.67* 2.62*  < > 2.80* 2.74* 2.67* 2.60*  CALCIUM 9.6  < > 9.2 9.9  < > 9.2 9.3 9.4 9.9  MG  --   --   --   --   --  2.2  --   --  2.2  PHOS 3.0  --  4.0 2.9  --   --   --   --   --   < > = values in this interval not displayed.  Recent Labs  02/01/17 2122  03/06/17 1141 03/08/17 0945 03/23/17 0600  AST 18  --   --  27 16  ALT  18  --   --  17 13*  ALKPHOS 55  --   --  48 54  BILITOT 0.6  --   --  0.6 0.5  PROT 7.0  --   --  6.6 6.7  ALBUMIN 3.2*  < > 3.6 2.9* 2.8*  < > = values in this interval not displayed.  Recent Labs  06/26/16 1159 02/01/17 2122  03/16/17 0614 03/20/17 0523 03/23/17 0600  WBC 7.9 7.1  < > 7.5 6.5 7.2  NEUTROABS 5.3 4.7  --   --   --  4.8  HGB 11.7* 12.3*  < > 11.5* 11.2* 11.6*  HCT 35.1* 37.2*  < > 34.5* 33.3* 34.7*  MCV 92 91.8  < > 93.0 94.7 94.3  PLT 182 152  < > 150 138* 180  < > = values in this interval not displayed. Lab Results  Component Value Date   TSH 2.049 02/02/2017   Lab Results  Component Value Date   HGBA1C 6.1 (H) 03/16/2017   Lab Results  Component Value Date   CHOL 114 02/02/2017   HDL 37 (L) 02/02/2017   LDLCALC 66 02/02/2017   TRIG 56 02/02/2017   CHOLHDL 3.1 02/02/2017    Significant Diagnostic Results in last 30 days:  Dg Chest 2 View  Result Date: 03/15/2017 CLINICAL DATA:  Weakness and near syncope. EXAM: CHEST  2 VIEW COMPARISON:  02/04/2017 FINDINGS: AP and lateral views of the chest show enlargement cardiopericardial silhouette, stable. There is bibasilar parenchymal and pleural scarring with small probably chronic diffusion. Lungs appear hyperexpanded. Left-sided delete permanent pacemaker again noted. The patient is status post cardiac valve replacement. IMPRESSION: 1. Stable exam. 2. Cardiomegaly with hyperexpansion chronic interstitial opacity. 3. Bibasilar scarring stable appearance of tiny effusions. Electronically Signed   By: Misty Stanley M.D.   On: 03/15/2017 12:38   Ct Head Wo Contrast  Result Date: 03/08/2017 CLINICAL DATA:  Patient slipped and fell out of recliner. No reported head trauma or loss of consciousness. EXAM: CT HEAD WITHOUT CONTRAST TECHNIQUE: Contiguous axial images were obtained from the base of the skull through the vertex without intravenous contrast. COMPARISON:  CT head 05/01/2013. FINDINGS: Brain: No evidence for  acute infarction, hemorrhage, mass lesion, hydrocephalus, or extra-axial fluid. There is moderately advanced generalized atrophy. Hypoattenuation of white matter representing small vessel disease. Old RIGHT basal ganglia infarct. Vascular: Calcification of the cavernous internal carotid arteries  consistent with cerebrovascular atherosclerotic disease. No signs of intracranial large vessel occlusion. Skull: Normal. Negative for fracture or focal lesion. Sinuses/Orbits: BILATERAL cataract extraction. No layering sinus fluid. Other: Compared with priors, atrophy has progressed. IMPRESSION: Atrophy and small vessel disease. No acute intracranial abnormality. No skull fracture or intracranial hemorrhage. Cerebrovascular atherosclerosis. Electronically Signed   By: Staci Righter M.D.   On: 03/08/2017 11:07    Assessment/Plan 1. Skin ulcer of second toe of left foot with fat layer exposed ([place])  Aquacel-AG- apply topically once daily  2. Skin ulcer of second toe, right, with unspecified severity (HCC)  Bactroban ointment, cover with light bandage, change daily  3. Non-healing non-surgical wound (Left chest)  Santyl ointment- apply liberal amount  Cover with NS damp to dry gauze  Cover with Allevyn  Change gauze daily  Change Allevyn Q 3 days  4. Hypoalbuminemia due to protein-calorie malnutrition (HCC)  Pro-stat 30 mL PO BID   Glucerna- 1 bottle/can po BID  Continue MVI  5. Vitamin D deficiency  Increase Cholecalciferol to 2,000 units po Q Day  Family/ staff Communication:   Total Time:  Documentation:  Face to Face:  Family/Phone:   Labs/tests ordered:  Cbc, Met C, Vitamin B12, Vitamin D, Mag+  Medication list reviewed and assessed for continued appropriateness.  Vikki Ports, NP-C Geriatrics [facility] [phone] [place], [place] [address] Cell Phone (Mon-Fri 8am-5pm):  [phone] On Call:  [phone] & follow prompts  after 5pm & weekends Office Phone:  [phone] Office Fax:  [phone]

## 2017-03-28 ENCOUNTER — Non-Acute Institutional Stay (SKILLED_NURSING_FACILITY): Payer: Medicare Other | Admitting: Gerontology

## 2017-03-28 ENCOUNTER — Encounter
Admission: RE | Admit: 2017-03-28 | Discharge: 2017-03-28 | Disposition: A | Discharge status: Home / Self Care | Payer: Medicare Other | Source: Ambulatory Visit | Attending: Internal Medicine | Admitting: Internal Medicine

## 2017-03-28 ENCOUNTER — Encounter: Payer: Self-pay | Admitting: Gerontology

## 2017-03-28 DIAGNOSIS — R55 Syncope and collapse: Secondary | ICD-10-CM | POA: Diagnosis not present

## 2017-03-28 DIAGNOSIS — E271 Primary adrenocortical insufficiency: Secondary | ICD-10-CM

## 2017-03-28 DIAGNOSIS — I951 Orthostatic hypotension: Secondary | ICD-10-CM

## 2017-03-28 LAB — CBC WITH DIFFERENTIAL/PLATELET
BASOS PCT: 1 %
Basophils Absolute: 0 10*3/uL (ref 0–0.1)
EOS ABS: 0.3 10*3/uL (ref 0–0.7)
EOS PCT: 4 %
HCT: 36.2 % — ABNORMAL LOW (ref 40.0–52.0)
Hemoglobin: 11.9 g/dL — ABNORMAL LOW (ref 13.0–18.0)
LYMPHS ABS: 1 10*3/uL (ref 1.0–3.6)
Lymphocytes Relative: 13 %
MCH: 30.8 pg (ref 26.0–34.0)
MCHC: 32.8 g/dL (ref 32.0–36.0)
MCV: 94 fL (ref 80.0–100.0)
MONOS PCT: 8 %
Monocytes Absolute: 0.6 10*3/uL (ref 0.2–1.0)
Neutro Abs: 5.6 10*3/uL (ref 1.4–6.5)
Neutrophils Relative %: 74 %
PLATELETS: 197 10*3/uL (ref 150–440)
RBC: 3.85 MIL/uL — ABNORMAL LOW (ref 4.40–5.90)
RDW: 14.9 % — ABNORMAL HIGH (ref 11.5–14.5)
WBC: 7.7 10*3/uL (ref 3.8–10.6)

## 2017-03-28 LAB — COMPREHENSIVE METABOLIC PANEL
ALBUMIN: 3.2 g/dL — AB (ref 3.5–5.0)
ALT: 17 U/L (ref 17–63)
ANION GAP: 8 (ref 5–15)
AST: 19 U/L (ref 15–41)
Alkaline Phosphatase: 59 U/L (ref 38–126)
BUN: 39 mg/dL — ABNORMAL HIGH (ref 6–20)
CALCIUM: 10 mg/dL (ref 8.9–10.3)
CHLORIDE: 96 mmol/L — AB (ref 101–111)
CO2: 34 mmol/L — AB (ref 22–32)
Creatinine, Ser: 2.77 mg/dL — ABNORMAL HIGH (ref 0.61–1.24)
GFR calc non Af Amer: 20 mL/min — ABNORMAL LOW (ref >60)
GFR, EST AFRICAN AMERICAN: 24 mL/min — AB (ref >60)
Glucose, Bld: 151 mg/dL — ABNORMAL HIGH (ref 65–99)
POTASSIUM: 4.1 mmol/L (ref 3.5–5.1)
SODIUM: 138 mmol/L (ref 135–145)
Total Bilirubin: 0.5 mg/dL (ref 0.3–1.2)
Total Protein: 7 g/dL (ref 6.5–8.1)

## 2017-03-28 NOTE — Progress Notes (Signed)
Location:   [facility] Room Number: 201B Place of Service:  SNF [phone]) Provider:  Toni Arthurs, NP-C  [name], MD  Patient Care Team: [name], MD as PCP - General (Unknown Physician Specialty) Minus Breeding, MD (Cardiology) Minna Merritts, MD as Consulting Physician (Cardiology) Alfonzo Feller, RN as [place], Darla Lesches, [place] as [place] Management  Extended Emergency Contact Information Primary Emergency Contact: Rumpf,[name] Address: [address] [place], [place] [address] Johnnette Litter of [place] Phone: [phone] Mobile Phone: [phone] Relation: Spouse  Code Status:  FULL Goals of care: Advanced Directive information Advanced Directives 03/28/2017  Does Patient Have a Medical Advance Directive? No  Type of Advance Directive -  Does patient want to make changes to medical advance directive? -  Copy of [place] in Chart? -  Would patient like information on creating a medical advance directive? -     Chief Complaint  Patient presents with  . Acute Visit    Follow up on presyncope    HPI:  Pt is a 81 y.o. male seen today for an acute visit for near syncope. Was called urgently to the room by nursing. The aid had taken pt into the bathroom. Pt began feeling dizzy, light-headed, was seeing black spots or objects were fuzzy appearing. Feeling as though he couldn't catch his breath fully. Was saying he felt as though he "was about to pass out." While sitting still, VSS, O2 sats WNL, pulse low 80's, 166/89. Pt was pale. Not diaphoretic. Vague complaints of malaise. When pt stood, b/p decreased to 114/60. Pulse 84. Once pt returned to bed, he felt very weak and was unable to lift his legs to get into the bed. Had difficulty moving himself in the bed. Once pt laid still for a few moments, he reports he was feeling somewhat  better. Pt denies n/v/d/f/c/cp/sob (once lying back down)/ha/abd pain/dizziness (once lying back down). Denies cough, congestion. However, B-LL diminished. Will obtain CXR to evaluate for PNA. Random early AM serum cortisol ordered two days ago- was low normal. Will obtain evening cortisol level for comparison. Will watch for results of this.      Past Medical History:  Diagnosis Date  . AAA (abdominal aortic aneurysm)repaired    a. 12/2006 s/p repair of 6cm infrarenal AAA with 63m Dacron graft.  . Anemia   . Anginal pain ([place]   . Atrial fibrillation/Flutter    a. Dx 2014;  b. 10/2012 s/p TEE/DCCV;  c. On amio and Eliquis (CHA2DS2VASc = 6).  . Basal cell carcinoma    a. 2013 x 2 back and left neck  . BPH (benign prostatic hypertrophy)   . Carotid artery occlusion    a. 11/2008 s/p R CEA; b. 01/2017 Carotid U/S: RICA <<03% LICA >>54%  . Chronic systolic heart failure ([place]    a. 02/2012 Echo: EF 30-35%, Pulm HTN, and modest RV dysfunction;  b. 10/2012 TEE: EF 25-30%, mild conc LVH, diff HK, mod RV dysfxn, mod dil RA/LA, mild MR, no MS, nl MVR, mod TR, Ao sclerosis; c. 01/2017 Echo: EF 45-50%, diff HK, triv MR, mod dil LA.  . CKD (chronic kidney disease), stage IV ([place]   . Coronary artery disease    a. 1999 PCI RCA;  b. 01/2005 NSTEMI: CABG x 3 (LIMA->LAD, VG->OM, VG->LPL) @ time of MVR.  .[name]  Diabetes mellitus, insulin dependent (IDDM), controlled ([place])   . DVT (deep venous thrombosis) ([place])   . GERD (gastroesophageal reflux disease)   . Heart disease   . History of hiatal hernia   . History of shingles   . Hyperlipidemia   . Hyperlipidemia, unspecified   . Hypertension   . Hypertensive heart disease   . Mitral regurgitation--s/p repair    a. 01/2005 MVR: 28-mm Edwards ET Logix ring annuloplasty; b. 10/2012 TEE: normal fxning MV ring with mild MR, no MS; 01/2017 Echo: Triv MR.  . Mixed Ischemic and Non-ischemic Cardiomyopathy    a. 02/2012 Echo: EF 30-35%, Pulm HTN, and modest RV dysfunction;   b. 10/2012 TEE: EF 25-30%, mild conc LVH, diff HK, mod RV dysfxn, mod dil RA/LA, mild MR, no MS, nl MVR, mod TR, Ao sclerosis; c. 01/2017 Echo: EF 45-50%.  . Myocardial infarction ([place])    2006  . Neuropathy    SOME  . Obesity   . Onychocryptosis   . Onychomycosis   . Orthopnea   . Orthostatic hypotension    a. 11/2016 -> meds reduced;  b. 01/2017 admitted w/ presyncope and orthostasis->Meds held.  . Oxygen deficit    a. Wears 2 lpm most of the day and @ HS.  . Paresthesias    in the feet  . Paronychia of third toe, right    contusion with paronychia right third toe  . Presence of permanent cardiac pacemaker   . PVC (premature ventricular contraction)    a. 02/2009 s/p RFCA ([place]).  . Sick sinus syndrome ([place])    a. 01/2005 s/p PPM 2/2 heart block following CABG;  b. 05/2012 Gen Change: MDT Adapta ADDRL1 DC PPM, ser # [id number].  . Sleep apnea    a. Intolerant of CPAP  . Type II diabetes mellitus ([place])    Past Surgical History:  Procedure Laterality Date  . ABDOMINAL AORTIC ANEURYSM REPAIR      May 2008,   . CARDIAC CATHETERIZATION  2006   @ [facility]  . CATARACT EXTRACTION     right  . CATARACT EXTRACTION W/PHACO Left 10/04/2016   Procedure: CATARACT EXTRACTION PHACO AND INTRAOCULAR LENS PLACEMENT (IOC);  Surgeon: Estill Cotta, MD;  Location: [facility] ORS;  Service: Ophthalmology;  Laterality: Left;  [place] 01:21 AP% 25.9 CDE 39.08 Fluid pack lot # [id number]   . CORONARY ARTERY BYPASS GRAFT     CABG with a  LIMA to the LAD, SVG to circumflex, SVG to posterolateral [id number].  Randolm Idol / REPLACE / REMOVE PACEMAKER  2006   Dr. [name]  . MITRAL VALVE REPAIR  2006    mitral valve repair with a #28 Edwards logic ring  . PACEMAKER PLACEMENT    . PERMANENT PACEMAKER GENERATOR CHANGE N/A 06/26/2012   Procedure: PERMANENT PACEMAKER GENERATOR CHANGE;  Surgeon: Deboraha Sprang, MD;  Location: [facility] C[facility] LAB;  Service: Cardiovascular;  Laterality: N/A;  . SKIN BIOPSY     left wrist; right elbow    . SKIN CANCER EXCISION      x 6  . SKIN CANCER EXCISION    . TRANSESOPHAGEAL ECHOCARDIOGRAM    . VASECTOMY      Allergies  Allergen Reactions  . Shellfish Allergy Anaphylaxis    Allergies as of 03/28/2017      Reactions   Shellfish Allergy Anaphylaxis      Medication List       Accurate as of 03/28/17  4:28 PM. Always use your most recent med  list.          ACCU-CHEK FASTCLIX LANCETS Misc TEST twice a day if needed   ACCU-CHEK SMARTVIEW test strip Generic drug:  glucose blood TEST twice a day   albuterol (2.5 MG/3ML) 0.083% nebulizer solution Commonly known as:  PROVENTIL Take 3 mLs (2.5 mg total) by nebulization every 6 (six) hours as needed for wheezing or shortness of breath.   amiodarone 200 MG tablet Commonly known as:  PACERONE take 1 tablet by mouth once daily   bacitracin 500 UNIT/GM ointment Apply 1 application topically daily. Apply to tip of 2nd toe   BD INSULIN SYRINGE ULTRAFINE 31G X 5/16" 0.3 ML Misc Generic drug:  Insulin Syringe-Needle U-100 use twice a day as directed   BOUDREAUXS BUTT PASTE 16 % Oint Generic drug:  Zinc Oxide Apply liberal amount topically to areas of skin irritation 2 times daily and as needed. Ok to leave at bedside.   calcitRIOL 0.5 MCG capsule Commonly known as:  ROCALTROL Take 0.5 mcg by mouth daily.   dextrose 40 % Gel Commonly known as:  GLUTOSE Take 1 Tube by mouth once as needed for low blood sugar. Give 1 tube every 30 minutes orally as needed until BS > 70 and pt is still responsive.   docusate sodium 100 MG capsule Commonly known as:  COLACE Take 100 mg by mouth 2 (two) times daily.   ELIQUIS 5 MG Tabs tablet Generic drug:  apixaban take 1 tablet by mouth twice a day   feeding supplement (PRO-STAT SUGAR FREE 64) Liqd Take 30 mLs by mouth 2 (two) times daily between meals.   finasteride 5 MG tablet Commonly known as:  PROSCAR Take 1 tablet (5 mg total) by mouth daily after supper.   GLUCAGON  EMERGENCY 1 MG injection Generic drug:  glucagon Inject 1 mg into the vein once as needed. Give 1 mg for hypoglycemia (symptomatic) not responding to oral glucose, snack. May repeat x 1 after 15 minutes. USE ONLY IF PT IS OR IS BECOMING UNRESPONSIVE.   GLUCERNA Liqd Take 1 Can by mouth 2 (two) times daily between meals.   insulin lispro protamine-lispro (75-25) 100 UNIT/ML Susp injection Commonly known as:  HUMALOG MIX 75/25 Inject 5 Units into the skin 2 (two) times daily with a meal.   midodrine 5 MG tablet Commonly known as:  PROAMATINE Take 3 tablets (15 mg total) by mouth 3 (three) times daily with meals.   MULTIVITAL tablet Take 1 tablet by mouth daily.   NASACORT ALLERGY 24HR 55 MCG/ACT Aero nasal inhaler Generic drug:  triamcinolone Place 2 sprays into the nose daily.   omeprazole 20 MG capsule Commonly known as:  PRILOSEC Take 20 mg by mouth daily. Monday, Wednesday, friday   OXYGEN Inhale 3 L into the lungs continuous.   polyethylene glycol packet Commonly known as:  MIRALAX / GLYCOLAX Take 17 g by mouth daily as needed for mild constipation.   pseudoephedrine 60 MG tablet Commonly known as:  SUDAFED Take 1 tablet (60 mg total) by mouth every 6 (six) hours.   rosuvastatin 20 MG tablet Commonly known as:  CRESTOR take 1 tablet by mouth at bedtime   Vitamin D3 2000 units capsule Take 2,000 Units by mouth daily.   zolpidem 5 MG tablet Commonly known as:  AMBIEN Take 5 mg by mouth at bedtime as needed for sleep.       Review of Systems  Constitutional: Positive for fatigue. Negative for activity change, appetite change, chills,  diaphoresis and fever.  HENT: Negative for congestion, sneezing, sore throat, trouble swallowing and voice change.   Respiratory: Negative for apnea, cough, choking, chest tightness, shortness of breath and wheezing.   Cardiovascular: Negative for chest pain, palpitations and leg swelling.  Gastrointestinal: Negative.  Negative for  abdominal distention, abdominal pain, constipation, diarrhea and nausea.  Genitourinary: Negative.  Negative for difficulty urinating, dysuria, frequency and urgency.  Musculoskeletal: Negative for back pain, gait problem and myalgias. Arthralgias: typical arthritis.  Skin: Positive for wound. Negative for color change, pallor and rash.  Neurological: Positive for dizziness, syncope, weakness and light-headedness. Negative for tremors, speech difficulty, numbness and headaches.  Psychiatric/Behavioral: Negative for agitation and behavioral problems.  All other systems reviewed and are negative.   Immunization History  Administered Date(s) Administered  . Influenza Split 06/28/2012  . Influenza, High Dose Seasonal PF 05/29/2016  . Influenza,inj,Quad PF,36+ Mos 06/21/2015  . Influenza-Unspecified 05/28/2013  . Pneumococcal Conjugate-13 06/28/2012  . Pneumococcal Polysaccharide-23 11/24/2014  . Td 03/06/2017   Pertinent  Health Maintenance Due  Topic Date Due  . URINE MICROALBUMIN  08/23/1946  . INFLUENZA VACCINE  03/28/2017  . OPHTHALMOLOGY EXAM  08/18/2017  . HEMOGLOBIN A1C  09/16/2017  . FOOT EXAM  03/06/2018  . PNA vac Low Risk Adult  Completed   Fall Risk  03/05/2017 02/22/2016  Falls in the past year? Yes No  Number falls in past yr: 1 -  Injury with Fall? No -  Risk Factor Category  High Fall Risk -  Risk for fall due to : History of fall(s);Impaired balance/gait -  Follow up Falls evaluation completed;Falls prevention discussed -   Functional Status Survey:    Vitals:   03/28/17 1614  BP: (!) 163/75  Pulse: 77  Resp: 20  Temp: 97.8 F (36.6 C)  SpO2: 94%  Weight: 243 lb 3.2 oz (110.3 kg)  Height: 6' (1.829 m)   Body mass index is 32.98 kg/m. Physical Exam  Constitutional: He is oriented to person, place, and time. Vital signs are normal. He appears well-developed and well-nourished. He is active and cooperative. He does not appear ill. No distress.  HENT:   Head: Normocephalic and atraumatic.  Mouth/Throat: Uvula is midline, oropharynx is clear and moist and mucous membranes are normal. Mucous membranes are not pale, not dry and not cyanotic.  Eyes: Pupils are equal, round, and reactive to light. Conjunctivae, EOM and lids are normal.  Neck: Trachea normal, normal range of motion and full passive range of motion without pain. Neck supple. No JVD present. No tracheal deviation, no edema and no erythema present. No thyromegaly present.  Cardiovascular: Normal rate, regular rhythm, normal heart sounds and intact distal pulses.  Exam reveals no gallop, no distant heart sounds and no friction rub.   No murmur heard. Pulses:      Dorsalis pedis pulses are 1+ on the right side, and 1+ on the left side.  Pulmonary/Chest: Effort normal and breath sounds normal. No accessory muscle usage. No respiratory distress. He has no decreased breath sounds. He has no wheezes. He has no rhonchi. He has no rales. He exhibits no tenderness.  Abdominal: Soft. Normal appearance and bowel sounds are normal. He exhibits no distension and no ascites. There is no tenderness.  Musculoskeletal: Normal range of motion. He exhibits no edema or tenderness.  Expected osteoarthritis, stiffness  Neurological: He is alert and oriented to person, place, and time. He has normal strength.  Skin: Skin is warm and dry. He is  not diaphoretic. No cyanosis. No pallor. Nails show no clubbing.     Psychiatric: He has a normal mood and affect. His speech is normal and behavior is normal. Judgment and thought content normal. Cognition and memory are normal.  Nursing note and vitals reviewed.   Labs reviewed:  Recent Labs  06/26/16 1159  02/07/17 0343 03/06/17 1141  03/16/17 0614 03/17/17 0430 03/19/17 1229 03/23/17 0600  NA 145*  < > 143 144  < > 139 137 135 139  K 4.7  < > 4.0 4.5  < > 3.3* 3.5 4.3 4.2  CL 99  < > 105 97  < > 97* 94* 95* 99*  CO2 36*  < > 34* 34*  < > 35* 35* 31 32   GLUCOSE 129*  < > 144* 105*  < > 190* 160* 172* 124*  BUN 43*  < > 47* 34*  < > 41* 43* 50* 44*  CREATININE 2.82*  < > 2.67* 2.62*  < > 2.80* 2.74* 2.67* 2.60*  CALCIUM 9.6  < > 9.2 9.9  < > 9.2 9.3 9.4 9.9  MG  --   --   --   --   --  2.2  --   --  2.2  PHOS 3.0  --  4.0 2.9  --   --   --   --   --   < > = values in this interval not displayed.  Recent Labs  02/01/17 2122  03/06/17 1141 03/08/17 0945 03/23/17 0600  AST 18  --   --  27 16  ALT 18  --   --  17 13*  ALKPHOS 55  --   --  48 54  BILITOT 0.6  --   --  0.6 0.5  PROT 7.0  --   --  6.6 6.7  ALBUMIN 3.2*  < > 3.6 2.9* 2.8*  < > = values in this interval not displayed.  Recent Labs  06/26/16 1159 02/01/17 2122  03/16/17 0614 03/20/17 0523 03/23/17 0600  WBC 7.9 7.1  < > 7.5 6.5 7.2  NEUTROABS 5.3 4.7  --   --   --  4.8  HGB 11.7* 12.3*  < > 11.5* 11.2* 11.6*  HCT 35.1* 37.2*  < > 34.5* 33.3* 34.7*  MCV 92 91.8  < > 93.0 94.7 94.3  PLT 182 152  < > 150 138* 180  < > = values in this interval not displayed. Lab Results  Component Value Date   TSH 2.049 02/02/2017   Lab Results  Component Value Date   HGBA1C 6.1 (H) 03/16/2017   Lab Results  Component Value Date   CHOL 114 02/02/2017   HDL 37 (L) 02/02/2017   LDLCALC 66 02/02/2017   TRIG 56 02/02/2017   CHOLHDL 3.1 02/02/2017    Significant Diagnostic Results in last 30 days:  Dg Chest 2 View  Result Date: 03/15/2017 CLINICAL DATA:  Weakness and near syncope. EXAM: CHEST  2 VIEW COMPARISON:  02/04/2017 FINDINGS: AP and lateral views of the chest show enlargement cardiopericardial silhouette, stable. There is bibasilar parenchymal and pleural scarring with small probably chronic diffusion. Lungs appear hyperexpanded. Left-sided delete permanent pacemaker again noted. The patient is status post cardiac valve replacement. IMPRESSION: 1. Stable exam. 2. Cardiomegaly with hyperexpansion chronic interstitial opacity. 3. Bibasilar scarring stable appearance of  tiny effusions. Electronically Signed   By: Misty Stanley M.D.   On: 03/15/2017 12:38   Ct Head Wo Contrast  Result Date: 03/08/2017 CLINICAL DATA:  Patient slipped and fell out of recliner. No reported head trauma or loss of consciousness. EXAM: CT HEAD WITHOUT CONTRAST TECHNIQUE: Contiguous axial images were obtained from the base of the skull through the vertex without intravenous contrast. COMPARISON:  CT head 05/01/2013. FINDINGS: Brain: No evidence for acute infarction, hemorrhage, mass lesion, hydrocephalus, or extra-axial fluid. There is moderately advanced generalized atrophy. Hypoattenuation of white matter representing small vessel disease. Old RIGHT basal ganglia infarct. Vascular: Calcification of the cavernous internal carotid arteries consistent with cerebrovascular atherosclerotic disease. No signs of intracranial large vessel occlusion. Skull: Normal. Negative for fracture or focal lesion. Sinuses/Orbits: BILATERAL cataract extraction. No layering sinus fluid. Other: Compared with priors, atrophy has progressed. IMPRESSION: Atrophy and small vessel disease. No acute intracranial abnormality. No skull fracture or intracranial hemorrhage. Cerebrovascular atherosclerosis. Electronically Signed   By: Staci Righter M.D.   On: 03/08/2017 11:07    Assessment/Plan 1. Orthostatic hypotension  Return to bed, elevate legs  Continue Midodrine 15 mg po TID  Continue Pseudophedrine 60 mg po Q 6 hours  2. Adrenal insufficiency (Addison's disease) (HCC)  Start Hydrocortisone 10 mg po BID  Follow blood pressures, monitor for Hypertension  Family/ staff Communication:   Total Time:  Documentation:  Face to Face:  Family/Phone:   Labs/tests ordered: CBC, Met C, PM serum cortisol, UA, C&S   Medication list reviewed and assessed for continued appropriateness.  Vikki Ports, NP-C Geriatrics [facility] [phone] [place], [place]  [address] Cell Phone (Mon-Fri 8am-5pm):  [phone] On Call:  [phone] & follow prompts after 5pm & weekends Office Phone:  [phone] Office Fax:  [phone]

## 2017-03-29 ENCOUNTER — Other Ambulatory Visit
Admission: RE | Admit: 2017-03-29 | Discharge: 2017-03-29 | Disposition: A | Discharge status: Home / Self Care | Payer: Medicare Other | Source: Ambulatory Visit | Attending: Gerontology | Admitting: Gerontology

## 2017-03-29 ENCOUNTER — Other Ambulatory Visit: Payer: Self-pay | Admitting: *Deleted

## 2017-03-29 ENCOUNTER — Ambulatory Visit (INDEPENDENT_AMBULATORY_CARE_PROVIDER_SITE_OTHER): Payer: Medicare Other | Admitting: Vascular Surgery

## 2017-03-29 DIAGNOSIS — E1122 Type 2 diabetes mellitus with diabetic chronic kidney disease: Secondary | ICD-10-CM | POA: Insufficient documentation

## 2017-03-29 DIAGNOSIS — N401 Enlarged prostate with lower urinary tract symptoms: Secondary | ICD-10-CM | POA: Insufficient documentation

## 2017-03-29 DIAGNOSIS — R338 Other retention of urine: Secondary | ICD-10-CM | POA: Insufficient documentation

## 2017-03-29 DIAGNOSIS — E1140 Type 2 diabetes mellitus with diabetic neuropathy, unspecified: Secondary | ICD-10-CM | POA: Insufficient documentation

## 2017-03-29 LAB — URINALYSIS, COMPLETE (UACMP) WITH MICROSCOPIC
BILIRUBIN URINE: NEGATIVE
Bacteria, UA: NONE SEEN
GLUCOSE, UA: 150 mg/dL — AB
Hgb urine dipstick: NEGATIVE
KETONES UR: NEGATIVE mg/dL
LEUKOCYTES UA: NEGATIVE
NITRITE: NEGATIVE
PH: 6 (ref 5.0–8.0)
Protein, ur: 30 mg/dL — AB
SPECIFIC GRAVITY, URINE: 1.011 (ref 1.005–1.030)
SQUAMOUS EPITHELIAL / LPF: NONE SEEN

## 2017-03-29 NOTE — Patient Outreach (Signed)
[place] [facility]) Care Management  03/29/2017  Marco Geathers Sr. [birth year] [id number]   Phone call to discharge planner [name] to discuss care planning meeting that took place today. Voicemail message left requesting a return call.     [name] [facility] Care Management [phone]

## 2017-03-29 NOTE — Patient Outreach (Signed)
[place] [facility]) Care Management  03/29/2017  Shalev Helminiak Sr. Jan 23, 1936 [id number]  Patient case has been reviewed in a multidisciplinary case discussion .  Plan Will continue to follow .  [name], RN, [place] Management Coordinator  [phone]- Mobile [phone]- Toll Free Main Office

## 2017-03-30 ENCOUNTER — Other Ambulatory Visit: Payer: Self-pay | Admitting: *Deleted

## 2017-03-30 ENCOUNTER — Encounter: Payer: Self-pay | Admitting: Family Medicine

## 2017-03-30 LAB — CORTISOL: Cortisol, Plasma: 10.3 ug/dL

## 2017-03-30 NOTE — Patient Outreach (Addendum)
[place] [facility]) Care Management  03/30/2017  Marco Mcclintock Sr. [birth year] [id number]   Phone call from [name], discharge planner at [facility] to discuss follow up from the care planning meeting that took place yesterday. Per Marco [name], no decisions have been made yet regarding patient's discharge plan.   Therapy will need to assess at least 2 additional weeks before a discharge plan is finalized.  Per Marco [name], the possibility of long term care has been brought up, however more time will be needed to assess patient before any decisions are made.  Plan: This social worker will continue to follow patient while in rehab.   [name] [facility] Care Management [phone]

## 2017-03-31 LAB — URINE CULTURE

## 2017-04-02 ENCOUNTER — Ambulatory Visit: Payer: Medicare Other | Admitting: *Deleted

## 2017-04-02 DIAGNOSIS — E46 Unspecified protein-calorie malnutrition: Secondary | ICD-10-CM | POA: Insufficient documentation

## 2017-04-02 DIAGNOSIS — I739 Peripheral vascular disease, unspecified: Secondary | ICD-10-CM | POA: Insufficient documentation

## 2017-04-02 DIAGNOSIS — E271 Primary adrenocortical insufficiency: Secondary | ICD-10-CM | POA: Insufficient documentation

## 2017-04-02 DIAGNOSIS — E559 Vitamin D deficiency, unspecified: Secondary | ICD-10-CM | POA: Insufficient documentation

## 2017-04-02 DIAGNOSIS — L97522 Non-pressure chronic ulcer of other part of left foot with fat layer exposed: Secondary | ICD-10-CM | POA: Insufficient documentation

## 2017-04-02 DIAGNOSIS — L97519 Non-pressure chronic ulcer of other part of right foot with unspecified severity: Secondary | ICD-10-CM | POA: Insufficient documentation

## 2017-04-02 DIAGNOSIS — L97909 Non-pressure chronic ulcer of unspecified part of unspecified lower leg with unspecified severity: Secondary | ICD-10-CM

## 2017-04-02 DIAGNOSIS — E8809 Other disorders of plasma-protein metabolism, not elsewhere classified: Secondary | ICD-10-CM | POA: Insufficient documentation

## 2017-04-02 DIAGNOSIS — T148XXA Other injury of unspecified body region, initial encounter: Secondary | ICD-10-CM | POA: Insufficient documentation

## 2017-04-02 NOTE — Progress Notes (Signed)
New PT note to include G codes.    03-17-2017 1553  PT G-Codes **NOT FOR INPATIENT CLASS**  Functional Assessment Tool Used AM-PAC 6 Clicks Basic Mobility;Clinical judgement  Functional Limitation Mobility: Walking and moving around  Mobility: Walking and Moving Around Current Status ([id number]) CM  Mobility: Walking and Moving Around Goal Status (X7353) CL   Collie Siad PT, DPT

## 2017-04-04 ENCOUNTER — Non-Acute Institutional Stay (SKILLED_NURSING_FACILITY): Payer: Medicare Other | Admitting: Gerontology

## 2017-04-04 ENCOUNTER — Encounter: Payer: Self-pay | Admitting: Gerontology

## 2017-04-04 DIAGNOSIS — I2510 Atherosclerotic heart disease of native coronary artery without angina pectoris: Secondary | ICD-10-CM | POA: Diagnosis not present

## 2017-04-04 DIAGNOSIS — E271 Primary adrenocortical insufficiency: Secondary | ICD-10-CM | POA: Diagnosis not present

## 2017-04-04 DIAGNOSIS — L308 Other specified dermatitis: Secondary | ICD-10-CM

## 2017-04-04 NOTE — Progress Notes (Signed)
Location:   [facility] Room Number: 201B Place of Service:  SNF [phone]) Provider:  Toni Arthurs, NP-C  [name], MD  Patient Care Team: [name], MD as PCP - General (Unknown Physician Specialty) Minus Breeding, MD (Cardiology) Minna Merritts, MD as Consulting Physician (Cardiology) Alfonzo Feller, RN as [place], Darla Lesches, [place] as [place] Management  Extended Emergency Contact Information Primary Emergency Contact: Roker,[name] Address: [address] [place], [place] [address] Johnnette Litter of [place] Phone: [phone] Mobile Phone: [phone] Relation: Spouse  Code Status:  FULL Goals of care: Advanced Directive information Advanced Directives 04/04/2017  Does Patient Have a Medical Advance Directive? No  Type of Advance Directive -  Does patient want to make changes to medical advance directive? -  Copy of [place] in Chart? -  Would patient like information on creating a medical advance directive? -     Chief Complaint  Patient presents with  . Medical Management of Chronic Issues    Routine Visit    HPI:  Pt is a 81 y.o. male seen today for follow up. Pt was admitted to the facility for rehab following syncopal episode with Orthostatic Hypotension. Pt was found to have low normal AM Serum Cortisol and low (normal) PM serum cortisol levels. He was initiated on Hydrocortisone with improvement in symptoms. However, he reports that though they are improved, he continues to have some dizziness with movement. Will make medication adjustments for this. Pt also c/o soreness on the gluteal fold. Area on the left gluteal fold of MAS-D. Skin is red, blanchable. Abrasion. Allevyn patch in place, but soiled. Will address with cream instead and off-setting pressure/ frequent turning to allow skin to dry, get air to it. No  bleeding, no drainage, no odor. Pt also reports he get a carotid doppler approximately every 6 months through his cardiologist due to a 70% and a 50% blockage of Carotids. Cardiology is monitoring this for worsening occlusion. Pt verbalized he was nervous about this being a contributor to his syncopal episodes and dizziness. Pt requested Carotid dopplers to be obtained and forwarded to his cardiologist. Otherwise, pt reports he is feeling better and is feeling stronger by working with PT/OT. Appetite is good. VSS. No other complaints.    Past Medical History:  Diagnosis Date  . AAA (abdominal aortic aneurysm)repaired    a. 12/2006 s/p repair of 6cm infrarenal AAA with 45mm Dacron graft.  . Anemia   . Anginal pain ([place])   . Atrial fibrillation/Flutter    a. Dx 2014;  b. 10/2012 s/p TEE/DCCV;  c. On amio and Eliquis (CHA2DS2VASc = 6).  . Basal cell carcinoma    a. 2013 x 2 back and left neck  . BPH (benign prostatic hypertrophy)   . Carotid artery occlusion    a. 11/2008 s/p R CEA; b. 01/2017 Carotid U/S: RICA <62%, LICA >83%.  . Chronic systolic heart failure ([place])    a. 02/2012 Echo: EF 30-35%, Pulm HTN, and modest RV dysfunction;  b. 10/2012 TEE: EF 25-30%, mild conc LVH, diff HK, mod RV dysfxn, mod dil RA/LA, mild MR, no MS, nl MVR, mod TR, Ao sclerosis; c. 01/2017 Echo: EF 45-50%, diff HK, triv MR, mod dil LA.  . CKD (chronic kidney disease), stage IV ([place])   . Coronary artery disease    a.  1999 PCI RCA;  b. 01/2005 NSTEMI: CABG x 3 (LIMA->LAD, VG->OM, VG->LPL) @ time of MVR.  . Diabetes mellitus, insulin dependent (IDDM), controlled ([place])   . DVT (deep venous thrombosis) ([place])   . GERD (gastroesophageal reflux disease)   . Heart disease   . History of hiatal hernia   . History of shingles   . Hyperlipidemia   . Hyperlipidemia, unspecified   . Hypertension   . Hypertensive heart disease   . Mitral regurgitation--s/p repair    a. 01/2005 MVR: 28-mm Edwards ET Logix ring annuloplasty; b.  10/2012 TEE: normal fxning MV ring with mild MR, no MS; 01/2017 Echo: Triv MR.  . Mixed Ischemic and Non-ischemic Cardiomyopathy    a. 02/2012 Echo: EF 30-35%, Pulm HTN, and modest RV dysfunction;  b. 10/2012 TEE: EF 25-30%, mild conc LVH, diff HK, mod RV dysfxn, mod dil RA/LA, mild MR, no MS, nl MVR, mod TR, Ao sclerosis; c. 01/2017 Echo: EF 45-50%.  . Myocardial infarction ([place])    2006  . Neuropathy    SOME  . Obesity   . Onychocryptosis   . Onychomycosis   . Orthopnea   . Orthostatic hypotension    a. 11/2016 -> meds reduced;  b. 01/2017 admitted w/ presyncope and orthostasis->Meds held.  . Oxygen deficit    a. Wears 2 lpm most of the day and @ HS.  . Paresthesias    in the feet  . Paronychia of third toe, right    contusion with paronychia right third toe  . Presence of permanent cardiac pacemaker   . PVC (premature ventricular contraction)    a. 02/2009 s/p RFCA ([place]).  . Sick sinus syndrome ([place])    a. 01/2005 s/p PPM 2/2 heart block following CABG;  b. 05/2012 Gen Change: MDT Adapta ADDRL1 DC PPM, ser # [id number].  . Sleep apnea    a. Intolerant of CPAP  . Type II diabetes mellitus ([place])    Past Surgical History:  Procedure Laterality Date  . ABDOMINAL AORTIC ANEURYSM REPAIR      May 2008,   . CARDIAC CATHETERIZATION  2006   @ [facility]  . CATARACT EXTRACTION     right  . CATARACT EXTRACTION W/PHACO Left 10/04/2016   Procedure: CATARACT EXTRACTION PHACO AND INTRAOCULAR LENS PLACEMENT (IOC);  Surgeon: Estill Cotta, MD;  Location: [facility] ORS;  Service: Ophthalmology;  Laterality: Left;  [place] 01:21 AP% 25.9 CDE 39.08 Fluid pack lot # [id number]   . CORONARY ARTERY BYPASS GRAFT     CABG with a  LIMA to the LAD, SVG to circumflex, SVG to posterolateral [id number].  Randolm Idol / REPLACE / REMOVE PACEMAKER  2006   Dr. [name]  . MITRAL VALVE REPAIR  2006    mitral valve repair with a #28 Edwards logic ring  . PACEMAKER PLACEMENT    . PERMANENT PACEMAKER GENERATOR CHANGE N/A  06/26/2012   Procedure: PERMANENT PACEMAKER GENERATOR CHANGE;  Surgeon: Deboraha Sprang, MD;  Location: [facility] CATH LAB;  Service: Cardiovascular;  Laterality: N/A;  . SKIN BIOPSY     left wrist; right elbow  . SKIN CANCER EXCISION      x 6  . SKIN CANCER EXCISION    . TRANSESOPHAGEAL ECHOCARDIOGRAM    . VASECTOMY      Allergies  Allergen Reactions  . Shellfish Allergy Anaphylaxis    Allergies as of 04/04/2017      Reactions   Shellfish Allergy Anaphylaxis      Medication List  Accurate as of 04/04/17 12:55 PM. Always use your most recent med list.          ACCU-CHEK FASTCLIX LANCETS Misc TEST twice a day if needed   ACCU-CHEK SMARTVIEW test strip Generic drug:  glucose blood TEST twice a day   albuterol (2.5 MG/3ML) 0.083% nebulizer solution Commonly known as:  PROVENTIL Take 3 mLs (2.5 mg total) by nebulization every 6 (six) hours as needed for wheezing or shortness of breath.   amiodarone 200 MG tablet Commonly known as:  PACERONE take 1 tablet by mouth once daily   bacitracin 500 UNIT/GM ointment Apply 1 application topically daily. Apply to tip of 2nd toe   BD INSULIN SYRINGE ULTRAFINE 31G X 5/16" 0.3 ML Misc Generic drug:  Insulin Syringe-Needle U-100 use twice a day as directed   BOUDREAUXS BUTT PASTE 16 % Oint Generic drug:  Zinc Oxide Apply liberal amount topically to areas of skin irritation 2 times daily and as needed. Ok to leave at bedside.   calcitRIOL 0.5 MCG capsule Commonly known as:  ROCALTROL Take 0.5 mcg by mouth daily.   dextrose 40 % Gel Commonly known as:  GLUTOSE Take 1 Tube by mouth once as needed for low blood sugar. Give 1 tube every 30 minutes orally as needed until BS > 70 and pt is still responsive.   docusate sodium 100 MG capsule Commonly known as:  COLACE Take 100 mg by mouth 2 (two) times daily.   ELIQUIS 5 MG Tabs tablet Generic drug:  apixaban take 1 tablet by mouth twice a day   feeding supplement (PRO-STAT SUGAR  FREE 64) Liqd Take 30 mLs by mouth 2 (two) times daily between meals.   finasteride 5 MG tablet Commonly known as:  PROSCAR Take 1 tablet (5 mg total) by mouth daily after supper.   GLUCAGON EMERGENCY 1 MG injection Generic drug:  glucagon Inject 1 mg into the vein once as needed. Give 1 mg for hypoglycemia (symptomatic) not responding to oral glucose, snack. May repeat x 1 after 15 minutes. USE ONLY IF PT IS OR IS BECOMING UNRESPONSIVE.   GLUCERNA Liqd Take 1 Can by mouth 2 (two) times daily between meals.   hydrocortisone 10 MG tablet Commonly known as:  CORTEF Take 10 mg by mouth 2 (two) times daily.   insulin lispro protamine-lispro (75-25) 100 UNIT/ML Susp injection Commonly known as:  HUMALOG MIX 75/25 Inject 5 Units into the skin 2 (two) times daily with a meal.   MULTIVITAL tablet Take 1 tablet by mouth daily.   NASACORT ALLERGY 24HR 55 MCG/ACT Aero nasal inhaler Generic drug:  triamcinolone Place 2 sprays into the nose daily.   omeprazole 20 MG capsule Commonly known as:  PRILOSEC Take 20 mg by mouth daily. Monday, Wednesday, friday   OXYGEN Inhale 3 L into the lungs continuous.   polyethylene glycol packet Commonly known as:  MIRALAX / GLYCOLAX Take 17 g by mouth daily as needed for mild constipation.   rosuvastatin 20 MG tablet Commonly known as:  CRESTOR take 1 tablet by mouth at bedtime   SANTYL ointment Generic drug:  collagenase Apply 1 application topically daily. Apply a thick layer topically to wound on left chest, cover with ns damp to dry gauze, cover with Allevyn. Change gauze daily. Change Allevyn every 3 days.   Vitamin D3 2000 units capsule Take 2,000 Units by mouth daily.   zolpidem 5 MG tablet Commonly known as:  AMBIEN Take 5 mg by mouth at bedtime  as needed for sleep.       Review of Systems  Constitutional: Positive for fatigue. Negative for activity change, appetite change, chills, diaphoresis and fever.  HENT: Negative for  congestion, sneezing, sore throat, trouble swallowing and voice change.   Respiratory: Negative for apnea, cough, choking, chest tightness, shortness of breath and wheezing.   Cardiovascular: Negative for chest pain, palpitations and leg swelling.  Gastrointestinal: Negative.  Negative for abdominal distention, abdominal pain, constipation, diarrhea and nausea.  Genitourinary: Negative.  Negative for difficulty urinating, dysuria, frequency and urgency.  Musculoskeletal: Negative for back pain, gait problem and myalgias. Arthralgias: typical arthritis.  Skin: Positive for wound. Negative for color change, pallor and rash.  Neurological: Positive for dizziness, weakness and light-headedness. Negative for tremors, syncope, speech difficulty, numbness and headaches.  Psychiatric/Behavioral: Negative for agitation and behavioral problems.  All other systems reviewed and are negative.   Immunization History  Administered Date(s) Administered  . Influenza Split 06/28/2012  . Influenza, High Dose Seasonal PF 05/29/2016  . Influenza,inj,Quad PF,36+ Mos 06/21/2015  . Influenza-Unspecified 05/28/2013  . Pneumococcal Conjugate-13 06/28/2012  . Pneumococcal Polysaccharide-23 11/24/2014  . Td 03/06/2017   Pertinent  Health Maintenance Due  Topic Date Due  . URINE MICROALBUMIN  08/23/1946  . INFLUENZA VACCINE  03/28/2017  . OPHTHALMOLOGY EXAM  08/18/2017  . HEMOGLOBIN A1C  09/16/2017  . FOOT EXAM  03/06/2018  . PNA vac Low Risk Adult  Completed   Fall Risk  03/05/2017 02/22/2016  Falls in the past year? Yes No  Number falls in past yr: 1 -  Injury with Fall? No -  Risk Factor Category  High Fall Risk -  Risk for fall due to : History of fall(s);Impaired balance/gait -  Follow up Falls evaluation completed;Falls prevention discussed -   Functional Status Survey:    Vitals:   04/04/17 1240  BP: (!) 183/64  Pulse: 65  Resp: 20  Temp: 98.5 F (36.9 C)  SpO2: 95%  Weight: 241 lb 1.6 oz  (109.4 kg)  Height: 6' (1.829 m)   Body mass index is 32.7 kg/m. Physical Exam  Constitutional: He is oriented to person, place, and time. Vital signs are normal. He appears well-developed and well-nourished. He is active and cooperative. He does not appear ill. No distress.  HENT:  Head: Normocephalic and atraumatic.  Mouth/Throat: Uvula is midline, oropharynx is clear and moist and mucous membranes are normal. Mucous membranes are not pale, not dry and not cyanotic.  Eyes: Pupils are equal, round, and reactive to light. Conjunctivae, EOM and lids are normal.  Neck: Trachea normal, normal range of motion and full passive range of motion without pain. Neck supple. No JVD present. No tracheal deviation, no edema and no erythema present. No thyromegaly present.  Cardiovascular: Normal rate, regular rhythm, normal heart sounds and intact distal pulses.  Exam reveals no gallop, no distant heart sounds and no friction rub.   No murmur heard. Pulses:      Dorsalis pedis pulses are 1+ on the right side, and 1+ on the left side.  Pulmonary/Chest: Effort normal and breath sounds normal. No accessory muscle usage. No respiratory distress. He has no decreased breath sounds. He has no wheezes. He has no rhonchi. He has no rales. He exhibits no tenderness.  Abdominal: Soft. Normal appearance and bowel sounds are normal. He exhibits no distension and no ascites. There is no tenderness.  Musculoskeletal: Normal range of motion. He exhibits no edema or tenderness.  Expected osteoarthritis, stiffness  Neurological: He is alert and oriented to person, place, and time. He has normal strength.  Skin: Skin is warm and dry. No rash noted. He is not diaphoretic. No cyanosis or erythema. No pallor. Nails show no clubbing.     Psychiatric: He has a normal mood and affect. His speech is normal and behavior is normal. Judgment and thought content normal. Cognition and memory are normal.  Nursing note and vitals  reviewed.   Labs reviewed:  Recent Labs  06/26/16 1159  02/07/17 0343 03/06/17 1141  03/16/17 0614  03/19/17 1229 03/23/17 0600 03/28/17 1536  NA 145*  < > 143 144  < > 139  < > 135 139 138  K 4.7  < > 4.0 4.5  < > 3.3*  < > 4.3 4.2 4.1  CL 99  < > 105 97  < > 97*  < > 95* 99* 96*  CO2 36*  < > 34* 34*  < > 35*  < > 31 32 34*  GLUCOSE 129*  < > 144* 105*  < > 190*  < > 172* 124* 151*  BUN 43*  < > 47* 34*  < > 41*  < > 50* 44* 39*  CREATININE 2.82*  < > 2.67* 2.62*  < > 2.80*  < > 2.67* 2.60* 2.77*  CALCIUM 9.6  < > 9.2 9.9  < > 9.2  < > 9.4 9.9 10.0  MG  --   --   --   --   --  2.2  --   --  2.2  --   PHOS 3.0  --  4.0 2.9  --   --   --   --   --   --   < > = values in this interval not displayed.  Recent Labs  03/08/17 0945 03/23/17 0600 03/28/17 1536  AST 27 16 19   ALT 17 13* 17  ALKPHOS 48 54 59  BILITOT 0.6 0.5 0.5  PROT 6.6 6.7 7.0  ALBUMIN 2.9* 2.8* 3.2*    Recent Labs  02/01/17 2122  03/20/17 0523 03/23/17 0600 03/28/17 1536  WBC 7.1  < > 6.5 7.2 7.7  NEUTROABS 4.7  --   --  4.8 5.6  HGB 12.3*  < > 11.2* 11.6* 11.9*  HCT 37.2*  < > 33.3* 34.7* 36.2*  MCV 91.8  < > 94.7 94.3 94.0  PLT 152  < > 138* 180 197  < > = values in this interval not displayed. Lab Results  Component Value Date   TSH 2.049 02/02/2017   Lab Results  Component Value Date   HGBA1C 6.1 (H) 03/16/2017   Lab Results  Component Value Date   CHOL 114 02/02/2017   HDL 37 (L) 02/02/2017   LDLCALC 66 02/02/2017   TRIG 56 02/02/2017   CHOLHDL 3.1 02/02/2017    Significant Diagnostic Results in last 30 days:  Dg Chest 2 View  Result Date: 03/15/2017 CLINICAL DATA:  Weakness and near syncope. EXAM: CHEST  2 VIEW COMPARISON:  02/04/2017 FINDINGS: AP and lateral views of the chest show enlargement cardiopericardial silhouette, stable. There is bibasilar parenchymal and pleural scarring with small probably chronic diffusion. Lungs appear hyperexpanded. Left-sided delete permanent  pacemaker again noted. The patient is status post cardiac valve replacement. IMPRESSION: 1. Stable exam. 2. Cardiomegaly with hyperexpansion chronic interstitial opacity. 3. Bibasilar scarring stable appearance of tiny effusions. Electronically Signed   By: Misty Stanley M.D.   On: 03/15/2017 12:38  Ct Head Wo Contrast  Result Date: 03/08/2017 CLINICAL DATA:  Patient slipped and fell out of recliner. No reported head trauma or loss of consciousness. EXAM: CT HEAD WITHOUT CONTRAST TECHNIQUE: Contiguous axial images were obtained from the base of the skull through the vertex without intravenous contrast. COMPARISON:  CT head 05/01/2013. FINDINGS: Brain: No evidence for acute infarction, hemorrhage, mass lesion, hydrocephalus, or extra-axial fluid. There is moderately advanced generalized atrophy. Hypoattenuation of white matter representing small vessel disease. Old RIGHT basal ganglia infarct. Vascular: Calcification of the cavernous internal carotid arteries consistent with cerebrovascular atherosclerotic disease. No signs of intracranial large vessel occlusion. Skull: Normal. Negative for fracture or focal lesion. Sinuses/Orbits: BILATERAL cataract extraction. No layering sinus fluid. Other: Compared with priors, atrophy has progressed. IMPRESSION: Atrophy and small vessel disease. No acute intracranial abnormality. No skull fracture or intracranial hemorrhage. Cerebrovascular atherosclerosis. Electronically Signed   By: Staci Righter M.D.   On: 03/08/2017 11:07    Assessment/Plan 1. Dermatitis associated with moisture  Endit Cream: Apply liberal amount to gluteal fold wound and surrounding tissue BID and prn. Do not scrub skin. If soiled, wipe off top layer and add more cream  Keep skin clean and dry  2. Coronary artery disease involving native coronary artery of native heart without angina pectoris  Carotid Dopplers- forward results to Cardiologist  3. Adrenal insufficiency (Addison's  disease)  Continue Hydrocortisone 10 mg po BID  Add Fludrocortisone 0.1 mg po Q Day  Follow up with PCP, Endocrinologist asap after discharge for follow up  Family/ staff Communication:   Total Time:  Documentation:  Face to Face:  Family/Phone:   Labs/tests ordered:    Medication list reviewed and assessed for continued appropriateness. Monthly medication orders reviewed and signed.  Vikki Ports, NP-C Geriatrics [facility] [phone] [place], [place] [address] Cell Phone (Mon-Fri 8am-5pm):  [phone] On Call:  [phone] & follow prompts after 5pm & weekends Office Phone:  [phone] Office Fax:  [phone]

## 2017-04-06 ENCOUNTER — Other Ambulatory Visit: Payer: Self-pay | Admitting: *Deleted

## 2017-04-06 ENCOUNTER — Encounter: Payer: Self-pay | Admitting: *Deleted

## 2017-04-06 NOTE — Patient Outreach (Addendum)
Marco [name]) Care Management  [name] Social Work  04/06/2017  Marco [name] Marco Selmont Sr. 07-13-1936 [id number]  Subjective:  Patient is a 81 year old male Currently in rehab at [facility] following a hospitalization  For near syncopal episode followed by subsequent weakness.   Per patient, he continues to have episodes of weakness and blurred vision which make it difficult to participate fully in therapy. Per patient his spouse is his primary caregiver, he would like to get stronger in his legs and resolve his dizziness before he discharges home. Spouse visits daily. Patient has one son, relationship described as estranged. Patient's spouse has 4 children that live locally but are not available for hands on support. Patent however plans on returning home. Patient has approximately 6 steps leading up to his home, patient not interested in getting  Wheelchair ramp but would like medical transportation home fot assistance getting into his home upon discharge.  Objective:   Encounter Medications:  Outpatient Encounter Prescriptions as of 04/06/2017  Medication Sig  . ACCU-CHEK FASTCLIX LANCETS MISC TEST twice a day if needed  . ACCU-CHEK SMARTVIEW test strip TEST twice a day  . albuterol (PROVENTIL) (2.5 MG/3ML) 0.083% nebulizer solution Take 3 mLs (2.5 mg total) by nebulization every 6 (six) hours as needed for wheezing or shortness of breath.  . Amino Acids-Protein Hydrolys (FEEDING SUPPLEMENT, PRO-STAT SUGAR FREE 64,) LIQD Take 30 mLs by mouth 2 (two) times daily between meals.  [name] amiodarone (PACERONE) 200 MG tablet take 1 tablet by mouth once daily  . bacitracin 500 UNIT/GM ointment Apply 1 application topically daily. Apply to tip of 2nd toe  . BD INSULIN SYRINGE ULTRAFINE 31G X 5/16" 0.3 ML MISC use twice a day as directed  . calcitRIOL (ROCALTROL) 0.5 MCG capsule Take 0.5 mcg by mouth daily.  . Cholecalciferol (VITAMIN D3) 2000 units capsule Take 2,000 Units by mouth  daily.  . collagenase (SANTYL) ointment Apply 1 application topically daily. Apply a thick layer topically to wound on left chest, cover with ns damp to dry gauze, cover with Allevyn. Change gauze daily. Change Allevyn every 3 days.  [name] dextrose (GLUTOSE) 40 % GEL Take 1 Tube by mouth once as needed for low blood sugar. Give 1 tube every 30 minutes orally as needed until BS > 70 and pt is still responsive.  . docusate sodium (COLACE) 100 MG capsule Take 100 mg by mouth 2 (two) times daily.   [name] ELIQUIS 5 MG TABS tablet take 1 tablet by mouth twice a day  . finasteride (PROSCAR) 5 MG tablet Take 1 tablet (5 mg total) by mouth daily after supper.  [name] glucagon (GLUCAGON EMERGENCY) 1 MG injection Inject 1 mg into the vein once as needed. Give 1 mg for hypoglycemia (symptomatic) not responding to oral glucose, snack. May repeat x 1 after 15 minutes. USE ONLY IF PT IS OR IS BECOMING UNRESPONSIVE.  [name] GLUCERNA (GLUCERNA) LIQD Take 1 Can by mouth 2 (two) times daily between meals.  . hydrocortisone (CORTEF) 10 MG tablet Take 10 mg by mouth 2 (two) times daily.  . insulin lispro protamine-lispro (HUMALOG MIX 75/25) (75-25) 100 UNIT/ML SUSP injection Inject 5 Units into the skin 2 (two) times daily with a meal.  . Multiple Vitamins-Minerals (MULTIVITAL) tablet Take 1 tablet by mouth daily.  [name] omeprazole (PRILOSEC) 20 MG capsule Take 20 mg by mouth daily. Monday, Wednesday, friday   . OXYGEN Inhale 3 L into the lungs continuous.   . polyethylene glycol (  MIRALAX / GLYCOLAX) packet Take 17 g by mouth daily as needed for mild constipation.  . rosuvastatin (CRESTOR) 20 MG tablet take 1 tablet by mouth at bedtime  . triamcinolone (NASACORT ALLERGY 24HR) 55 MCG/ACT AERO nasal inhaler Place 2 sprays into the nose daily.   . Zinc Oxide (BOUDREAUXS BUTT PASTE) 16 % OINT Apply liberal amount topically to areas of skin irritation 2 times daily and as needed. Ok to leave at bedside.  . zolpidem (AMBIEN) 5 MG tablet Take 5 mg  by mouth at bedtime as needed for sleep.   No facility-administered encounter medications on file as of 04/06/2017.     Functional Status:  In your present state of health, do you have any difficulty performing the following activities: 04/06/2017 03/15/2017  Hearing? N N  Vision? Y Y  Difficulty concentrating or making decisions? N N  Walking or climbing stairs? Y Y  Comment - cane and walker at home  Dressing or bathing? Y N  Doing errands, shopping? Y Y  Comment - wife assists    Conservation officer, nature and eating ? - -  Using the Toilet? - -  In the past six months, have you accidently leaked urine? - -  Comment - -  Do you have problems with loss of bowel control? - -  Managing your Medications? - -  Managing your Finances? - -  Housekeeping or managing your Housekeeping? - -  Comment - -  Some recent data might be hidden    Fall/Depression Screening:  PHQ 2/9 Scores 03/06/2017 02/22/2016  PHQ - 2 Score 3 3  PHQ- 9 Score 9 9    Assessment:   Patient friendly and engaging, mild confusion noted. He would repeat himself often during the visit. Patient was having a ultrasound when this Education officer, museum arrived.  Spoke with PT, no discharge date set yet. They would like to stabilize patient medically before discharge home. Per PT, his blood pressure has been high, vision blurred and patient cannot stand without support. Patient described by PT as having a  low activity tolerance and is only able to participate in PT for short periods of time in his room.  This social worker spoke with the Discharge planner, she had no additional information regarding patient's discharge plan.    Plan: This Education officer, museum will follow up with patient within 2 weeks.

## 2017-04-06 NOTE — Patient Outreach (Addendum)
[facility]) Care Management  04/06/2017  Marco Dibello Sr. 1935/10/16 [id number]   Phone call to discharge planner at [facility], to discuss patient's status in rehab and long term plan. Voicemail message  left requesting a return call.    [name] [facility] Care Management [phone]

## 2017-04-10 ENCOUNTER — Encounter: Payer: Medicare Other | Admitting: *Deleted

## 2017-04-10 ENCOUNTER — Other Ambulatory Visit: Payer: Self-pay | Admitting: *Deleted

## 2017-04-10 NOTE — Patient Outreach (Signed)
[facility]) Care Management  04/10/2017  Marco Daigler Sr. 10-10-1935 [id number]   Phone call to the discharge planner at [facility] Page who states that patient has requested to return home on Friday with HH. Per Joelene Millin, patient and his wife both agree with this plan. Patient now walking with a walker.    [name] [facility] Care Management [phone]

## 2017-04-10 NOTE — Patient Outreach (Signed)
[facility]) Care Management  04/10/2017  Marco Odonoghue Sr. 10-17-1935 [id number]   Phone call to patient's wifeon [name] consent to discuss patient's upcoming discharge scheduled for 04/13/17. Voicemail message left requesting a return call.   [name] [facility] Care Management [phone]

## 2017-04-11 ENCOUNTER — Encounter: Payer: Self-pay | Admitting: *Deleted

## 2017-04-11 ENCOUNTER — Other Ambulatory Visit: Payer: Self-pay | Admitting: *Deleted

## 2017-04-11 ENCOUNTER — Encounter: Payer: Self-pay | Admitting: Gerontology

## 2017-04-11 DIAGNOSIS — N2581 Secondary hyperparathyroidism of renal origin: Secondary | ICD-10-CM | POA: Diagnosis not present

## 2017-04-11 DIAGNOSIS — N184 Chronic kidney disease, stage 4 (severe): Secondary | ICD-10-CM | POA: Diagnosis not present

## 2017-04-11 DIAGNOSIS — D631 Anemia in chronic kidney disease: Secondary | ICD-10-CM | POA: Diagnosis not present

## 2017-04-11 DIAGNOSIS — E1122 Type 2 diabetes mellitus with diabetic chronic kidney disease: Secondary | ICD-10-CM | POA: Diagnosis not present

## 2017-04-11 NOTE — Progress Notes (Signed)
Opened in error; Disregard.

## 2017-04-11 NOTE — Patient Outreach (Signed)
[name]) Care Management  04/11/2017  Marco Hopfensperger Sr. 1936/06/28 [id number]   Phone call to patient's spouse to discuss discharge scheduled for Friday. Per patient's spouse, she is concerned about his discharge scheduled for Friday and would feel more confident if patient was more independent and willing to do more on his own.  Per patient's spouse, she does not have support from family and friends as they all work during the day. Private duty assistance is not affordable at this time although patient is now in co-pay days.  This social worker suggested that she speak to the discharge planner about her concerns and what it may look like financially if he stayed a little while longer to improve his strength and mobility.     Plan: Patient's spouse to discuss her concerns with Joelene Millin Page the discharge planner and the possibility of extending his stay to increase his strength and mobility.    [name] [facility] Care Management [phone]

## 2017-04-12 ENCOUNTER — Encounter: Payer: Self-pay | Admitting: Gerontology

## 2017-04-12 ENCOUNTER — Non-Acute Institutional Stay (SKILLED_NURSING_FACILITY): Payer: Medicare Other | Admitting: Gerontology

## 2017-04-12 DIAGNOSIS — I951 Orthostatic hypotension: Secondary | ICD-10-CM | POA: Diagnosis not present

## 2017-04-12 DIAGNOSIS — E559 Vitamin D deficiency, unspecified: Secondary | ICD-10-CM | POA: Diagnosis not present

## 2017-04-12 DIAGNOSIS — I2510 Atherosclerotic heart disease of native coronary artery without angina pectoris: Secondary | ICD-10-CM

## 2017-04-12 DIAGNOSIS — L97519 Non-pressure chronic ulcer of other part of right foot with unspecified severity: Secondary | ICD-10-CM

## 2017-04-12 DIAGNOSIS — E271 Primary adrenocortical insufficiency: Secondary | ICD-10-CM

## 2017-04-12 DIAGNOSIS — L97522 Non-pressure chronic ulcer of other part of left foot with fat layer exposed: Secondary | ICD-10-CM

## 2017-04-12 DIAGNOSIS — E46 Unspecified protein-calorie malnutrition: Secondary | ICD-10-CM | POA: Diagnosis not present

## 2017-04-12 DIAGNOSIS — T148XXA Other injury of unspecified body region, initial encounter: Secondary | ICD-10-CM | POA: Diagnosis not present

## 2017-04-12 DIAGNOSIS — L308 Other specified dermatitis: Secondary | ICD-10-CM

## 2017-04-12 DIAGNOSIS — E8809 Other disorders of plasma-protein metabolism, not elsewhere classified: Secondary | ICD-10-CM

## 2017-04-12 NOTE — Progress Notes (Signed)
Location:   [place] Room Number: 201B Place of Service:  SNF [phone])  Provider: Toni Arthurs, NP-C  PCP: [name], MD Patient Care Team: [name], MD as PCP - General (Unknown Physician Specialty) Minus Breeding, MD (Cardiology) Minna Merritts, MD as Consulting Physician (Cardiology) Alfonzo Feller, RN as [place], [place], [place] as [place] Management  Extended Emergency Contact Information Primary Emergency Contact: Rantz,[name] Address: [address] [place], [place] [address] Johnnette Litter of [place] Phone: [phone] Mobile Phone: [phone] Relation: Spouse  Code Status: FULL Goals of care:  Advanced Directive information Advanced Directives 04/12/2017  Does Patient Have a Medical Advance Directive? No  Type of Advance Directive -  Does patient want to make changes to medical advance directive? -  Copy of [place] in Chart? -  Would patient like information on creating a medical advance directive? -     Allergies  Allergen Reactions  . Shellfish Allergy Anaphylaxis    Chief Complaint  Patient presents with  . Discharge Note    Dicharged from SNF    HPI:  81 y.o. male      Past Medical History:  Diagnosis Date  . AAA (abdominal aortic aneurysm)repaired    a. 12/2006 s/p repair of 6cm infrarenal AAA with 46mm Dacron graft.  . Anemia   . Anginal pain ([place])   . Atrial fibrillation/Flutter    a. Dx 2014;  b. 10/2012 s/p TEE/DCCV;  c. On amio and Eliquis (CHA2DS2VASc = 6).  . Basal cell carcinoma    a. 2013 x 2 back and left neck  . BPH (benign prostatic hypertrophy)   . Carotid artery occlusion    a. 11/2008 s/p R CEA; b. 01/2017 Carotid U/S: RICA <34%, LICA >19%.  . Chronic systolic heart failure ([place])    a. 02/2012 Echo: EF 30-35%, Pulm HTN, and modest RV dysfunction;  b. 10/2012 TEE: EF 25-30%,  mild conc LVH, diff HK, mod RV dysfxn, mod dil RA/LA, mild MR, no MS, nl MVR, mod TR, Ao sclerosis; c. 01/2017 Echo: EF 45-50%, diff HK, triv MR, mod dil LA.  . CKD (chronic kidney disease), stage IV ([place])   . Coronary artery disease    a. 1999 PCI RCA;  b. 01/2005 NSTEMI: CABG x 3 (LIMA->LAD, VG->OM, VG->LPL) @ time of MVR.  . Diabetes mellitus, insulin dependent (IDDM), controlled ([place])   . DVT (deep venous thrombosis) ([place])   . GERD (gastroesophageal reflux disease)   . Heart disease   . History of hiatal hernia   . History of shingles   . Hyperlipidemia   . Hyperlipidemia, unspecified   . Hypertension   . Hypertensive heart disease   . Mitral regurgitation--s/p repair    a. 01/2005 MVR: 28-mm Edwards ET Logix ring annuloplasty; b. 10/2012 TEE: normal fxning MV ring with mild MR, no MS; 01/2017 Echo: Triv MR.  . Mixed Ischemic and Non-ischemic Cardiomyopathy    a. 02/2012 Echo: EF 30-35%, Pulm HTN, and modest RV dysfunction;  b. 10/2012 TEE: EF 25-30%, mild conc LVH, diff HK, mod RV dysfxn, mod dil RA/LA, mild MR, no MS, nl MVR, mod TR, Ao sclerosis; c. 01/2017 Echo: EF 45-50%.  . Myocardial infarction ([place])    2006  . Neuropathy    SOME  . Obesity   . Onychocryptosis   . Onychomycosis   .  Orthopnea   . Orthostatic hypotension    a. 11/2016 -> meds reduced;  b. 01/2017 admitted w/ presyncope and orthostasis->Meds held.  . Oxygen deficit    a. Wears 2 lpm most of the day and @ HS.  . Paresthesias    in the feet  . Paronychia of third toe, right    contusion with paronychia right third toe  . Presence of permanent cardiac pacemaker   . PVC (premature ventricular contraction)    a. 02/2009 s/p RFCA ([place]).  . Sick sinus syndrome ([place])    a. 01/2005 s/p PPM 2/2 heart block following CABG;  b. 05/2012 Gen Change: MDT Adapta ADDRL1 DC PPM, ser # [id number].  . Sleep apnea    a. Intolerant of CPAP  . Type II diabetes mellitus ([place])     Past Surgical History:  Procedure Laterality  Date  . ABDOMINAL AORTIC ANEURYSM REPAIR      May 2008,   . CARDIAC CATHETERIZATION  2006   @ [facility]  . CATARACT EXTRACTION     right  . CATARACT EXTRACTION W/PHACO Left 10/04/2016   Procedure: CATARACT EXTRACTION PHACO AND INTRAOCULAR LENS PLACEMENT (IOC);  Surgeon: Estill Cotta, MD;  Location: [facility] ORS;  Service: Ophthalmology;  Laterality: Left;  [place] 01:21 AP% 25.9 CDE 39.08 Fluid pack lot # [id number]   . CORONARY ARTERY BYPASS GRAFT     CABG with a  LIMA to the LAD, SVG to circumflex, SVG to posterolateral [id number].  Randolm Idol / REPLACE / REMOVE PACEMAKER  2006   Dr. [name]  . MITRAL VALVE REPAIR  2006    mitral valve repair with a #28 Edwards logic ring  . PACEMAKER PLACEMENT    . PERMANENT PACEMAKER GENERATOR CHANGE N/A 06/26/2012   Procedure: PERMANENT PACEMAKER GENERATOR CHANGE;  Surgeon: Deboraha Sprang, MD;  Location: [facility] C[facility] LAB;  Service: Cardiovascular;  Laterality: N/A;  . SKIN BIOPSY     left wrist; right elbow  . SKIN CANCER EXCISION      x 6  . SKIN CANCER EXCISION    . TRANSESOPHAGEAL ECHOCARDIOGRAM    . VASECTOMY        reports that he quit smoking about 29 years ago. His smoking use included Cigarettes. He has a 51.00 pack-year smoking history. He has never used smokeless tobacco. He reports that he drinks about 0.6 oz of alcohol per week . He reports that he does not use drugs. Social History   Social History  . Marital status: Married    Spouse name: N/A  . Number of children: 5  . Years of education: college degree   Occupational History  . Retired     Engineer, structural in [place]  .  Retired   Social History Main Topics  . Smoking status: Former Smoker    Packs/day: 1.50    Years: 34.00    Types: Cigarettes    Quit date: 08/28/1987  . Smokeless tobacco: Never Used  . Alcohol use 0.6 oz/week    1 Cans of beer per week     Comment: Occasionally but none since his surgery in June  . Drug use: No  . Sexual activity: No   Other Topics Concern    . Not on file   Social History Narrative   Lives locally with wife.  Sedentary - sits most of the day.  Ambulates some around his home - uses a walker.   Functional Status Survey:    Allergies  Allergen Reactions  .  Shellfish Allergy Anaphylaxis    Pertinent  Health Maintenance Due  Topic Date Due  . URINE MICROALBUMIN  08/23/1948  . INFLUENZA VACCINE  03/28/2017  . OPHTHALMOLOGY EXAM  08/18/2017  . HEMOGLOBIN A1C  09/16/2017  . FOOT EXAM  03/06/2018  . PNA vac Low Risk Adult  Completed    Medications: Allergies as of 04/12/2017      Reactions   Shellfish Allergy Anaphylaxis      Medication List       Accurate as of 04/12/17  2:06 PM. Always use your most recent med list.          ACCU-CHEK FASTCLIX LANCETS Misc TEST twice a day if needed   ACCU-CHEK SMARTVIEW test strip Generic drug:  glucose blood TEST twice a day   albuterol (2.5 MG/3ML) 0.083% nebulizer solution Commonly known as:  PROVENTIL Take 3 mLs (2.5 mg total) by nebulization every 6 (six) hours as needed for wheezing or shortness of breath.   amiodarone 200 MG tablet Commonly known as:  PACERONE take 1 tablet by mouth once daily   bacitracin 500 UNIT/GM ointment Apply 1 application topically daily. Apply to tip of 2nd toe   BD INSULIN SYRINGE ULTRAFINE 31G X 5/16" 0.3 ML Misc Generic drug:  Insulin Syringe-Needle U-100 use twice a day as directed   BOUDREAUXS BUTT PASTE 16 % Oint Generic drug:  Zinc Oxide Apply liberal amount topically to areas of skin irritation 2 times daily and as needed. Ok to leave at bedside.   calcitRIOL 0.5 MCG capsule Commonly known as:  ROCALTROL Take 0.5 mcg by mouth daily.   dextrose 40 % Gel Commonly known as:  GLUTOSE Take 1 Tube by mouth once as needed for low blood sugar. Give 1 tube every 30 minutes orally as needed until BS > 70 and pt is still responsive.   docusate sodium 100 MG capsule Commonly known as:  COLACE Take 100 mg by mouth 2 (two)  times daily.   ELIQUIS 5 MG Tabs tablet Generic drug:  apixaban take 1 tablet by mouth twice a day   ENDIT EX Apply liberal amount topically to gluteal fold wound and surrounding tissue 2 times daily and as needed. Do not scrub skin. If soiled, wipe off top layer and add more cream.   feeding supplement (PRO-STAT SUGAR FREE 64) Liqd Take 30 mLs by mouth 2 (two) times daily between meals.   finasteride 5 MG tablet Commonly known as:  PROSCAR Take 1 tablet (5 mg total) by mouth daily after supper.   fludrocortisone 0.1 MG tablet Commonly known as:  FLORINEF Take 0.1 mg by mouth daily.   GLUCAGON EMERGENCY 1 MG injection Generic drug:  glucagon Inject 1 mg into the vein once as needed. Give 1 mg for hypoglycemia (symptomatic) not responding to oral glucose, snack. May repeat x 1 after 15 minutes. USE ONLY IF PT IS OR IS BECOMING UNRESPONSIVE.   GLUCERNA Liqd Take 1 Can by mouth 2 (two) times daily between meals.   hydrocortisone 10 MG tablet Commonly known as:  CORTEF Take 10 mg by mouth 2 (two) times daily.   insulin lispro protamine-lispro (75-25) 100 UNIT/ML Susp injection Commonly known as:  HUMALOG MIX 75/25 Inject 5 Units into the skin 2 (two) times daily with a meal.   Melatonin 3 MG Tabs Take 1 tablet by mouth at bedtime.   MULTIVITAL tablet Take 1 tablet by mouth daily.   NASACORT ALLERGY 24HR 55 MCG/ACT Aero nasal inhaler Generic drug:  triamcinolone Place 2 sprays into the nose daily.   omeprazole 20 MG capsule Commonly known as:  PRILOSEC Take 20 mg by mouth daily. Monday, Wednesday, friday   OXYGEN Inhale 3 L into the lungs continuous.   polyethylene glycol packet Commonly known as:  MIRALAX / GLYCOLAX Take 17 g by mouth daily as needed for mild constipation.   rosuvastatin 20 MG tablet Commonly known as:  CRESTOR take 1 tablet by mouth at bedtime   SANTYL ointment Generic drug:  collagenase Apply 1 application topically daily. Apply a thick  layer topically to wound on left chest, cover with ns damp to dry gauze, cover with Allevyn. Change gauze daily. Change Allevyn every 3 days.   Vitamin D3 2000 units capsule Take 2,000 Units by mouth daily.   zolpidem 5 MG tablet Commonly known as:  AMBIEN Take 5 mg by mouth at bedtime as needed for sleep.       Review of Systems  Constitutional: Positive for fatigue. Negative for activity change, appetite change, chills, diaphoresis and fever.  HENT: Negative for congestion, sneezing, sore throat, trouble swallowing and voice change.   Respiratory: Negative for apnea, cough, choking, chest tightness, shortness of breath and wheezing.   Cardiovascular: Negative for chest pain, palpitations and leg swelling.  Gastrointestinal: Negative.  Negative for abdominal distention, abdominal pain, constipation, diarrhea and nausea.  Genitourinary: Negative.  Negative for difficulty urinating, dysuria, frequency and urgency.  Musculoskeletal: Negative for back pain, gait problem and myalgias. Arthralgias: typical arthritis.  Skin: Positive for wound. Negative for color change, pallor and rash.  Neurological: Positive for weakness. Negative for dizziness, tremors, syncope, speech difficulty, light-headedness, numbness and headaches.  Psychiatric/Behavioral: Negative for agitation and behavioral problems.  All other systems reviewed and are negative.   Vitals:   04/12/17 1404  BP: (!) 166/87  Pulse: 60  Resp: 16  Temp: 97.6 F (36.4 C)  SpO2: 99%  Weight: 242 lb 9.6 oz (110 kg)  Height: 6' (1.829 m)   Body mass index is 32.9 kg/m. Physical Exam  Constitutional: He is oriented to person, place, and time. Vital signs are normal. He appears well-developed and well-nourished. He is active and cooperative. He does not appear ill. No distress.  HENT:  Head: Normocephalic and atraumatic.  Mouth/Throat: Uvula is midline, oropharynx is clear and moist and mucous membranes are normal. Mucous  membranes are not pale, not dry and not cyanotic.  Eyes: Pupils are equal, round, and reactive to light. Conjunctivae, EOM and lids are normal.  Neck: Trachea normal, normal range of motion and full passive range of motion without pain. Neck supple. No JVD present. No tracheal deviation, no edema and no erythema present. No thyromegaly present.  Cardiovascular: Normal rate, regular rhythm, normal heart sounds and intact distal pulses.  Exam reveals no gallop, no distant heart sounds and no friction rub.   No murmur heard. Pulses:      Dorsalis pedis pulses are 1+ on the right side, and 1+ on the left side.  Pulmonary/Chest: Effort normal and breath sounds normal. No accessory muscle usage. No respiratory distress. He has no decreased breath sounds. He has no wheezes. He has no rhonchi. He has no rales. He exhibits no tenderness.  Abdominal: Soft. Normal appearance and bowel sounds are normal. He exhibits no distension and no ascites. There is no tenderness.  Musculoskeletal: Normal range of motion. He exhibits no edema or tenderness.  Expected osteoarthritis, stiffness  Neurological: He is alert and oriented to person, place,  and time. He has normal strength.  Skin: Skin is warm and dry. No rash noted. He is not diaphoretic. No cyanosis or erythema. No pallor. Nails show no clubbing.     Psychiatric: He has a normal mood and affect. His speech is normal and behavior is normal. Judgment and thought content normal. Cognition and memory are normal.  Nursing note and vitals reviewed.   Labs reviewed: Basic Metabolic Panel:  Recent Labs  06/26/16 1159  02/07/17 0343 03/06/17 1141  03/16/17 0614  03/19/17 1229 03/23/17 0600 03/28/17 1536  NA 145*  < > 143 144  < > 139  < > 135 139 138  K 4.7  < > 4.0 4.5  < > 3.3*  < > 4.3 4.2 4.1  CL 99  < > 105 97  < > 97*  < > 95* 99* 96*  CO2 36*  < > 34* 34*  < > 35*  < > 31 32 34*  GLUCOSE 129*  < > 144* 105*  < > 190*  < > 172* 124* 151*  BUN 43*   < > 47* 34*  < > 41*  < > 50* 44* 39*  CREATININE 2.82*  < > 2.67* 2.62*  < > 2.80*  < > 2.67* 2.60* 2.77*  CALCIUM 9.6  < > 9.2 9.9  < > 9.2  < > 9.4 9.9 10.0  MG  --   --   --   --   --  2.2  --   --  2.2  --   PHOS 3.0  --  4.0 2.9  --   --   --   --   --   --   < > = values in this interval not displayed. Liver Function Tests:  Recent Labs  03/08/17 0945 03/23/17 0600 03/28/17 1536  AST 27 16 19   ALT 17 13* 17  ALKPHOS 48 54 59  BILITOT 0.6 0.5 0.5  PROT 6.6 6.7 7.0  ALBUMIN 2.9* 2.8* 3.2*    Recent Labs  03/08/17 0945  LIPASE 23   No results for input(s): AMMONIA in the last 8760 hours. CBC:  Recent Labs  02/01/17 2122  03/20/17 0523 03/23/17 0600 03/28/17 1536  WBC 7.1  < > 6.5 7.2 7.7  NEUTROABS 4.7  --   --  4.8 5.6  HGB 12.3*  < > 11.2* 11.6* 11.9*  HCT 37.2*  < > 33.3* 34.7* 36.2*  MCV 91.8  < > 94.7 94.3 94.0  PLT 152  < > 138* 180 197  < > = values in this interval not displayed. Cardiac Enzymes:  Recent Labs  03/08/17 0945 03/08/17 1328 03/15/17 1147  TROPONINI <0.03 <0.03 <0.03   BNP: Invalid input(s): POCBNP CBG:  Recent Labs  03/20/17 0755 03/20/17 1141 03/20/17 1659  GLUCAP 154* 139* 134*    Procedures and Imaging Studies During Stay: Dg Chest 2 View  Result Date: 03/15/2017 CLINICAL DATA:  Weakness and near syncope. EXAM: CHEST  2 VIEW COMPARISON:  02/04/2017 FINDINGS: AP and lateral views of the chest show enlargement cardiopericardial silhouette, stable. There is bibasilar parenchymal and pleural scarring with small probably chronic diffusion. Lungs appear hyperexpanded. Left-sided delete permanent pacemaker again noted. The patient is status post cardiac valve replacement. IMPRESSION: 1. Stable exam. 2. Cardiomegaly with hyperexpansion chronic interstitial opacity. 3. Bibasilar scarring stable appearance of tiny effusions. Electronically Signed   By: Misty Stanley M.D.   On: 03/15/2017 12:38    Assessment/Plan:  1. Adrenal  insufficiency (Addison's disease)  Continue Hydrocortisone 10 mg po BID  Continue Fludrocortisone 0.1 mg po Q Day  Follow up with PCP, Endocrinologist asap after discharge for follow up  2. Orthostatic hypotension  Return to bed, elevate legs when at rest  Change positions slowly, allow time for pressures to normalize before standing/walking  Use walker for stabilization  Follow up with Cardiologist asap for possible medication adjustments  3. Skin ulcer of second toe of left foot with fat layer exposed ([place])  Aquacel-AG- apply topically once daily  4. Skin ulcer of second toe, right, with unspecified severity (HCC)  Bactroban ointment, cover with light bandage, change daily   5. Non-healing non-surgical wound (Left chest)  Santyl ointment- apply liberal amount  Cover with NS damp to dry gauze  Cover with Allevyn  Change gauze daily  Change Allevyn Q 3 days  Follow up with Dr Cleda Mccreedy asap after discharge for wound management  6. Hypoalbuminemia due to protein-calorie malnutrition (HCC)  Pro-stat 30 mL PO BID   Glucerna- 1 bottle/can po BID  Continue MVI  7. Vitamin D deficiency  Increase Cholecalciferol to 2,000 units po Q Day   8. Dermatitis associated with moisture  Endit Cream: Apply liberal amount to gluteal fold wound and surrounding tissue BID and prn. Do not scrub skin. If soiled, wipe off top layer and add more cream  Keep skin clean and dry  9. Coronary artery disease involving native coronary artery of native heart without angina pectoris  Carotid Dopplers- forward results to Cardiologist  Patient is being discharged with the following home health services:  [facility] PT/OT/NSG/Aid through [facility]  Patient is being discharged with the following durable medical equipment: none- needs ramp into the home   Patient has been advised to f/u with their PCP in 1-2 weeks to bring them up to date on their rehab stay.  Social services at facility was  responsible for arranging this appointment.  Pt was provided with a 30 day supply of prescriptions for medications and refills must be obtained from their PCP.  For controlled substances, a more limited supply may be provided adequate until PCP appointment only.  Future labs/tests needed:    Family/ staff Communication:   Total Time:  Documentation:  Face to Face: 35 minutes (>50% of the time was spent educating and counseling pt and wife on disease process, especially of new dx of Adrenal Insufficiency, and importance of MD follow up asap after discharge  Family/Phone:  Vikki Ports, NP-C [facility] [address], [place] [address] Cell Phone (Mon-Fri 8am-5pm):  [phone] On Call:  [phone] & follow prompts after 5pm & weekends Office Phone:  [phone] Office Fax:  [phone]

## 2017-04-16 ENCOUNTER — Other Ambulatory Visit: Payer: Self-pay | Admitting: *Deleted

## 2017-04-16 DIAGNOSIS — E1140 Type 2 diabetes mellitus with diabetic neuropathy, unspecified: Secondary | ICD-10-CM | POA: Diagnosis not present

## 2017-04-16 DIAGNOSIS — L97521 Non-pressure chronic ulcer of other part of left foot limited to breakdown of skin: Secondary | ICD-10-CM | POA: Diagnosis not present

## 2017-04-16 DIAGNOSIS — Z794 Long term (current) use of insulin: Secondary | ICD-10-CM | POA: Diagnosis not present

## 2017-04-16 DIAGNOSIS — B351 Tinea unguium: Secondary | ICD-10-CM | POA: Diagnosis not present

## 2017-04-16 NOTE — Patient Outreach (Signed)
[place] [facility]) Care Management  04/16/2017  Marco Klingel Sr. May 03, 1936 [id number]   Planned discharge from [place] place on 8/17, placed call to patient, no answer at phone number provided, able to leave a hipaa compliant message requesting a return call.  Plan Await return call if no response will attempt call in the next day.     [name], RN, [place] Management Coordinator  [phone]- Mobile [phone]- Toll Free Main Office

## 2017-04-17 ENCOUNTER — Other Ambulatory Visit: Payer: Self-pay | Admitting: *Deleted

## 2017-04-17 NOTE — Patient Outreach (Signed)
[place] [facility]) Care Management  04/17/2017  Marco Yoho Sr. 07-21-1936 [id number]  Patient with planned discharge from [facility] place on 8/17.  1007 Placed call to patient home number, no answer able to leave a hipaa compliant telephone message requesting a return call.   1500   Returned call to patient wife Marco [name], she reports patient was not discharged home until today. She reports they made it safely into home , patient had few steps to enter into home, because they do not have a ramp.  She discussed patient is wearing oxygen at 2 liters, he has been able to tolerate walking to bathroom using walker once since being at home. Wife reports patient has all medication at home except 2 new prescriptions that she will pick up on today, she requested to review medications at another time due still trying to get settled into getting home today.  Wife reports Home health has been arranged with [facility] for RN/PT/OT and anticipates contact in the next 48 hours if not heard from them in 48 hours  she has contact information . Marco [name] is agreeable to [facility] RN and LCSW Marco [name] plan  visit on this week.  Plan Will plan home visit on this week request follow up call day before visit ,will continue to follow for transition of care with weekly outreaches.   Marco Draft, RN, [place] Management Coordinator  [phone]- Mobile [phone]- Toll Free Main Office

## 2017-04-18 ENCOUNTER — Encounter: Payer: Self-pay | Admitting: Cardiology

## 2017-04-18 DIAGNOSIS — I4892 Unspecified atrial flutter: Secondary | ICD-10-CM | POA: Diagnosis not present

## 2017-04-18 DIAGNOSIS — I4891 Unspecified atrial fibrillation: Secondary | ICD-10-CM | POA: Diagnosis not present

## 2017-04-18 DIAGNOSIS — J9611 Chronic respiratory failure with hypoxia: Secondary | ICD-10-CM | POA: Diagnosis not present

## 2017-04-18 DIAGNOSIS — E1151 Type 2 diabetes mellitus with diabetic peripheral angiopathy without gangrene: Secondary | ICD-10-CM | POA: Diagnosis not present

## 2017-04-18 DIAGNOSIS — I951 Orthostatic hypotension: Secondary | ICD-10-CM | POA: Diagnosis not present

## 2017-04-18 DIAGNOSIS — I272 Pulmonary hypertension, unspecified: Secondary | ICD-10-CM | POA: Diagnosis not present

## 2017-04-18 DIAGNOSIS — L89152 Pressure ulcer of sacral region, stage 2: Secondary | ICD-10-CM | POA: Diagnosis not present

## 2017-04-18 DIAGNOSIS — E1122 Type 2 diabetes mellitus with diabetic chronic kidney disease: Secondary | ICD-10-CM | POA: Diagnosis not present

## 2017-04-18 DIAGNOSIS — E1140 Type 2 diabetes mellitus with diabetic neuropathy, unspecified: Secondary | ICD-10-CM | POA: Diagnosis not present

## 2017-04-18 DIAGNOSIS — N184 Chronic kidney disease, stage 4 (severe): Secondary | ICD-10-CM | POA: Diagnosis not present

## 2017-04-18 DIAGNOSIS — I5022 Chronic systolic (congestive) heart failure: Secondary | ICD-10-CM | POA: Diagnosis not present

## 2017-04-18 DIAGNOSIS — I129 Hypertensive chronic kidney disease with stage 1 through stage 4 chronic kidney disease, or unspecified chronic kidney disease: Secondary | ICD-10-CM | POA: Diagnosis not present

## 2017-04-18 DIAGNOSIS — I2510 Atherosclerotic heart disease of native coronary artery without angina pectoris: Secondary | ICD-10-CM | POA: Diagnosis not present

## 2017-04-19 ENCOUNTER — Encounter: Payer: Self-pay | Admitting: *Deleted

## 2017-04-19 ENCOUNTER — Other Ambulatory Visit: Payer: Self-pay | Admitting: *Deleted

## 2017-04-19 NOTE — Patient Outreach (Signed)
[place] [facility]) Care Management  04/19/2017  Marco [name] Sr. Sep 17, 1935 [id number]   Follow up transition of care call  Spoke with patient wife, Swayze Pries, she reports patient is still weak, requiring assistance out of chair.  Patient has recliner chair that he sits in daily and sleeps in at night, wife is looking into getting a lift chair. Wife discussed some difficulty with her assisting him up from the chair, due to patient decreased strength and it physically hard on her,some back discomfort, not sure how long she can manage   She discussed patient calling her at 3 am for assistance to bathroom, she is not used to doing that.  [facility] has visited for initial RN assessment , unsure of next visit yet, but PT,OT and bath aide will follow, also patient will be set up weight monitoring , blood pressure and oxygen machine.  Wife reports patient has not been able able to weigh since being at home, has slight swelling in lower leg, wearing compression hose. She denies patient complaint of feeling dizzy when standing.   Wife agreeable to home visit on 8/24 and with [employer], LCSW.  Plan  Will follow weekly for transition of care , initial home visit this week.    [name], RN, [place] Management Coordinator  [phone]- Mobile [phone]- Toll Free Main Office

## 2017-04-20 ENCOUNTER — Other Ambulatory Visit: Payer: Self-pay | Admitting: *Deleted

## 2017-04-20 DIAGNOSIS — R531 Weakness: Secondary | ICD-10-CM | POA: Diagnosis not present

## 2017-04-20 DIAGNOSIS — R42 Dizziness and giddiness: Secondary | ICD-10-CM | POA: Diagnosis not present

## 2017-04-20 NOTE — Patient Outreach (Signed)
[facility]) Care Management  [facility] Social Work  04/20/2017  Marco Shanholtzer Sr. 08-31-1935 [id number]  Subjective:  Patient is A 81 year old male, recently dischrged home from a rehab stay at [facility]. Per patient, his spouse is his primary caregiver. He spends most of his time in a recliner chair stating that he is unable to lay flat in a traditional bed.    Objective:   Encounter Medications:  Outpatient Encounter Prescriptions as of 04/20/2017  Medication Sig  . ACCU-CHEK FASTCLIX LANCETS MISC TEST twice a day if needed  . ACCU-CHEK SMARTVIEW test strip TEST twice a day  . albuterol (PROVENTIL) (2.5 MG/3ML) 0.083% nebulizer solution Take 3 mLs (2.5 mg total) by nebulization every 6 (six) hours as needed for wheezing or shortness of breath. (Patient not taking: Reported on 04/20/2017)  . Amino Acids-Protein Hydrolys (FEEDING SUPPLEMENT, PRO-STAT SUGAR FREE 64,) LIQD Take 30 mLs by mouth 2 (two) times daily between meals.  [name] amiodarone (PACERONE) 200 MG tablet take 1 tablet by mouth once daily  . bacitracin 500 UNIT/GM ointment Apply 1 application topically daily. Apply to tip of 2nd toe  . BD INSULIN SYRINGE ULTRAFINE 31G X 5/16" 0.3 ML MISC use twice a day as directed  . calcitRIOL (ROCALTROL) 0.5 MCG capsule Take 0.5 mcg by mouth daily.  . Cholecalciferol (VITAMIN D3) 2000 units capsule Take 2,000 Units by mouth daily.  . collagenase (SANTYL) ointment Apply 1 application topically daily. Apply a thick layer topically to wound on left chest, cover with ns damp to dry gauze, cover with Allevyn. Change gauze daily. Change Allevyn every 3 days.  [name] dextrose (GLUTOSE) 40 % GEL Take 1 Tube by mouth once as needed for low blood sugar. Give 1 tube every 30 minutes orally as needed until BS > 70 and pt is still responsive.  . docusate sodium (COLACE) 100 MG capsule Take 100 mg by mouth 2 (two) times daily.   [name] ELIQUIS 5 MG TABS tablet take 1 tablet by mouth twice a  day  . finasteride (PROSCAR) 5 MG tablet Take 1 tablet (5 mg total) by mouth daily after supper.  . fludrocortisone (FLORINEF) 0.1 MG tablet Take 0.1 mg by mouth daily.  [name] glucagon (GLUCAGON EMERGENCY) 1 MG injection Inject 1 mg into the vein once as needed. Give 1 mg for hypoglycemia (symptomatic) not responding to oral glucose, snack. May repeat x 1 after 15 minutes. USE ONLY IF PT IS OR IS BECOMING UNRESPONSIVE.  [name] GLUCERNA (GLUCERNA) LIQD Take 1 Can by mouth 2 (two) times daily between meals.  . hydrocortisone (CORTEF) 10 MG tablet Take 10 mg by mouth 2 (two) times daily.  . insulin lispro protamine-lispro (HUMALOG MIX 75/25) (75-25) 100 UNIT/ML SUSP injection Inject 5 Units into the skin 2 (two) times daily with a meal. (Patient taking differently: Inject 5 Units into the skin 2 (two) times daily with a meal. Taking 10 units)  . Melatonin 3 MG TABS Take 1 tablet by mouth at bedtime.  . Multiple Vitamins-Minerals (MULTIVITAL) tablet Take 1 tablet by mouth daily.  [name] omeprazole (PRILOSEC) 20 MG capsule Take 20 mg by mouth daily. Monday, Wednesday, friday   . OXYGEN Inhale 3 L into the lungs continuous.   . polyethylene glycol (MIRALAX / GLYCOLAX) packet Take 17 g by mouth daily as needed for mild constipation.  . rosuvastatin (CRESTOR) 20 MG tablet take 1 tablet by mouth at bedtime  . Skin Protectants, Misc. (ENDIT EX) Apply liberal  amount topically to gluteal fold wound and surrounding tissue 2 times daily and as needed. Do not scrub skin. If soiled, wipe off top layer and add more cream.  . triamcinolone (NASACORT ALLERGY 24HR) 55 MCG/ACT AERO nasal inhaler Place 2 sprays into the nose daily.   . Zinc Oxide (BOUDREAUXS BUTT PASTE) 16 % OINT Apply liberal amount topically to areas of skin irritation 2 times daily and as needed. Ok to leave at bedside.  . zolpidem (AMBIEN) 5 MG tablet Take 5 mg by mouth at bedtime as needed for sleep.   No facility-administered encounter medications on file as of  04/20/2017.     Functional Status:  In your present state of health, do you have any difficulty performing the following activities: 04/19/2017 04/06/2017  Hearing? N N  Vision? Y Y  Difficulty concentrating or making decisions? N N  Walking or climbing stairs? Y Y  Comment - -  Dressing or bathing? Y Y  Doing errands, shopping? [place] and eating ? Y Y  Using the Toilet? Y N  Comment needs help with walking -  In the past six months, have you accidently leaked urine? N N  Comment - -  Do you have problems with loss of bowel control? N N  Managing your Medications? Tempie Donning  Comment wife helps wife helps  Managing your Finances? Tempie Donning  Comment wife assist wife assists  Housekeeping or managing your Housekeeping? Tempie Donning  Comment wife does wife assists  Some recent data might be hidden    Fall/Depression Screening:  PHQ 2/9 Scores 04/06/2017 03/06/2017 02/22/2016  PHQ - 2 Score 0 3 3  PHQ- 9 Score - 9 9    Assessment: Patient's spouse is patient's primary caregiver. Per spouse, she has no friends or family that can assist with patient  care due to their work schedules. Patient's spouse admits to being overwhelmed with patient's care, and verbalized a understanding of self care and the need for support. however is reluctant  to hire private duty aids due to the cost involved. Patient does not qualify for Medicaid. Self care emphasized, contact information for in home care agencies provided as well as med alert system through [facility] in case of emergencies. Per patient's spouse, she will give the above some thought.  Plan: This Education officer, museum will follow up with patient and patient's spouse within 1 month regarding her decisions related to in  home support.

## 2017-04-20 NOTE — Patient Outreach (Signed)
[facility]) Care Management   04/21/2017  Marco Winer Sr. 04/19/1936 [id number]  Marco [name] Sr. is an 81 y.o. male  Subjective:  Patient reports being worn out after wife assisting with bath on today.  Objective:  BP (!) 150/82 (BP Location: Right Arm, Patient Position: Sitting, Cuff Size: Large)   Pulse 73   Resp 18   SpO2 92%   Patient resting in recliner chair  Review of Systems  HENT: Negative.   Eyes: Negative.   Respiratory: Positive for shortness of breath.        Tires easily with activity   Cardiovascular: Positive for leg swelling.  Gastrointestinal: Negative.   Genitourinary: Negative.   Musculoskeletal: Positive for back pain.  Skin: Negative.   Neurological: Positive for dizziness and weakness.       Complains of dizziness upon first standing  Endo/Heme/Allergies: Bruises/bleeds easily.  Psychiatric/Behavioral: Negative.     Physical Exam  Constitutional: He is oriented to person, place, and time. He appears well-developed and well-nourished.  Cardiovascular: Normal rate and normal heart sounds.   Respiratory: Effort normal.  Few crackles at bases    GI: Soft. Bowel sounds are normal.  Neurological: He is alert and oriented to person, place, and time.  Skin: Skin is warm and dry.  Ecchymotic areas bilateral arms.  Bilateral knee high TED hose   Psychiatric: He has a normal mood and affect. His behavior is normal. Judgment and thought content normal.    Encounter Medications:   No facility-administered encounter medications on file as of 04/20/2017.    Outpatient Encounter Prescriptions as of 04/20/2017  Medication Sig  . ACCU-CHEK FASTCLIX LANCETS MISC TEST twice a day if needed  . ACCU-CHEK SMARTVIEW test strip TEST twice a day  . amiodarone (PACERONE) 200 MG tablet take 1 tablet by mouth once daily  . bacitracin 500 UNIT/GM ointment Apply 1 application topically daily. Apply to tip of 2nd toe  . BD INSULIN  SYRINGE ULTRAFINE 31G X 5/16" 0.3 ML MISC use twice a day as directed  . calcitRIOL (ROCALTROL) 0.5 MCG capsule Take 0.5 mcg by mouth daily.  . Cholecalciferol (VITAMIN D3) 2000 units capsule Take 2,000 Units by mouth daily.  [name] docusate sodium (COLACE) 100 MG capsule Take 100 mg by mouth 2 (two) times daily.   [name] ELIQUIS 5 MG TABS tablet take 1 tablet by mouth twice a day  . finasteride (PROSCAR) 5 MG tablet Take 1 tablet (5 mg total) by mouth daily after supper.  . fludrocortisone (FLORINEF) 0.1 MG tablet Take 0.1 mg by mouth daily.  [name] GLUCERNA (GLUCERNA) LIQD Take 1 Can by mouth 2 (two) times daily between meals.  . hydrocortisone (CORTEF) 10 MG tablet Take 10 mg by mouth 2 (two) times daily.  . insulin lispro protamine-lispro (HUMALOG MIX 75/25) (75-25) 100 UNIT/ML SUSP injection Inject 5 Units into the skin 2 (two) times daily with a meal. (Patient taking differently: Inject 5 Units into the skin 2 (two) times daily with a meal. Taking 10 units)  . Multiple Vitamins-Minerals (MULTIVITAL) tablet Take 1 tablet by mouth daily.  [name] omeprazole (PRILOSEC) 20 MG capsule Take 20 mg by mouth daily. Monday, Wednesday, friday   . OXYGEN Inhale 3 L into the lungs continuous.   . polyethylene glycol (MIRALAX / GLYCOLAX) packet Take 17 g by mouth daily as needed for mild constipation.  . rosuvastatin (CRESTOR) 20 MG tablet take 1 tablet by mouth at bedtime  . triamcinolone (NASACORT ALLERGY  24HR) 55 MCG/ACT AERO nasal inhaler Place 2 sprays into the nose daily.   [name] albuterol (PROVENTIL) (2.5 MG/3ML) 0.083% nebulizer solution Take 3 mLs (2.5 mg total) by nebulization every 6 (six) hours as needed for wheezing or shortness of breath.  . Amino Acids-Protein Hydrolys (FEEDING SUPPLEMENT, PRO-STAT SUGAR FREE 64,) LIQD Take 30 mLs by mouth 2 (two) times daily between meals.  . collagenase (SANTYL) ointment Apply 1 application topically daily. Apply a thick layer topically to wound on left chest, cover with ns damp  to dry gauze, cover with Allevyn. Change gauze daily. Change Allevyn every 3 days.  [name] dextrose (GLUTOSE) 40 % GEL Take 1 Tube by mouth once as needed for low blood sugar. Give 1 tube every 30 minutes orally as needed until BS > 70 and pt is still responsive.  [name] glucagon (GLUCAGON EMERGENCY) 1 MG injection Inject 1 mg into the vein once as needed. Give 1 mg for hypoglycemia (symptomatic) not responding to oral glucose, snack. May repeat x 1 after 15 minutes. USE ONLY IF PT IS OR IS BECOMING UNRESPONSIVE.  [name] Melatonin 3 MG TABS Take 1 tablet by mouth at bedtime.  . Skin Protectants, Misc. (ENDIT EX) Apply liberal amount topically to gluteal fold wound and surrounding tissue 2 times daily and as needed. Do not scrub skin. If soiled, wipe off top layer and add more cream.  . Zinc Oxide (BOUDREAUXS BUTT PASTE) 16 % OINT Apply liberal amount topically to areas of skin irritation 2 times daily and as needed. Ok to leave at bedside.  . zolpidem (AMBIEN) 5 MG tablet Take 5 mg by mouth at bedtime as needed for sleep.  Patient was recently discharged from hospital and all medications have been reviewed.  Functional Status:   In your present state of health, do you have any difficulty performing the following activities: 04/21/2017 04/19/2017  Hearing? N N  Vision? N Y  Difficulty concentrating or making decisions? N N  Walking or climbing stairs? Y Y  Comment - -  Dressing or bathing? Y Y  Doing errands, shopping? [place] and eating ? - Y  Using the Toilet? - Y  Comment - needs help with walking  In the past six months, have you accidently leaked urine? - N  Comment - -  Do you have problems with loss of bowel control? - N  Managing your Medications? - Y  Comment - wife helps  Managing your Finances? - Y  Comment - wife assist  Housekeeping or managing your Housekeeping? - Y  Comment - wife does  Some recent data might be hidden    Fall/Depression Screening:    Fall  Risk  04/20/2017 04/06/2017 03/05/2017  Falls in the past year? Yes No Yes  Comment - fell over a yer ago at home -  Number falls in past yr: 2 or more - 1  Injury with Fall? No - No  Risk Factor Category  High Fall Risk - High Fall Risk  Risk for fall due to : History of fall(s);Impaired balance/gait - History of fall(s);Impaired balance/gait  Follow up Falls evaluation completed;Falls prevention discussed - Falls evaluation completed;Falls prevention discussed   PHQ 2/9 Scores 04/06/2017 03/06/2017 02/22/2016  PHQ - 2 Score 0 3 3  PHQ- 9 Score - 9 9    Assessment:    1. Transition of care program home visit ,consent reviewed, no changes needed 2 High Fall Risk/Recent Orthostatic hypotension/History of  Hypertension   - difficulty standing up from chair at times  still has some dizziness with first standing,Wife has difficulty at times with helping patient to stand from chair .Would benefit from lift chair and therapy.  Wearing bilateral lower leg TED hose. Patient taking medications as prescribed,reviewed discharge list from [facility]. 3. Heart Failure - Not weighing at home yet, without increased swelling, Wearing oxygen at 2 liters, patient spot checking oxygen saturations. Sleeping in recliner chair as usual . Wife discussed patient not on fluid pill at present. Patient reports not adding salt to foods,and wife monitors salt in foods.  4.Diabetes- monitoring blood sugars, at least daily , range of blood sugars  5.Difficulty with sleeping - has melatonin on discharge medication list from [facility] patient  not  taking yet.   [facility] contacted patient possible will begin services , patient anticipating tele monitoring  blood pressure, and scales from agency .  Plan:  1. Review [facility] packet . Will continue weekly outreach next call in a week.  2. Reinforce fall precautions, and participation in home health therapy. Provided EMMI on orthostatic hypotension. Reinforced to notify MD for new or  worsening of symptoms . Continue to monitor blood pressure at home.  3.Reinforced daily weights as able to stand safely, reviewed symptoms of worsening of heart failure when to call MD. Provided EMMI handout of keeping track of daily weights  4. Reinforced continuing to monitor daily.  5. Patient will take medications as prescribed, and schedule sooner than 9/5 PCP for post discharge visit .  Will consult [place] for medication review patient on greater than 10 medications.    Will send PCP visit note.  [facility] CM Care Plan Problem One     Most Recent Value  Care Plan Problem One  Patient with recent discharge from rehab after hospital admission related near syncope, and chronic condition Heart failure   Role Documenting the Problem One  Care Management [place] for Problem One  Active  [facility] Long Term Goal   Patient will not experience a hospital admission in the next 31 days   [facility] Long Term Goal Start Date  04/19/17  Interventions for Problem One Long Term Goal  Advised regarding taking medications as prescribed, notifying MD sooner for new or worsening symptoms   [facility] CM Short Term Goal #1   Patient will monitor blood pressure at least 3 days a week in the next 30 days   [facility] CM Short Term Goal #1 Start Date  04/19/17  Interventions for Short Term Goal #1  Reviewed  regarding importance of keeping track of readings and notifying MD sooner for symptoms related to changes in blood pressure higher or lower than usual   [facility] CM Short Term Goal #2   Patient will not experience a fall in the next 30 days  [facility] CM Short Term Goal #2 Start Date  04/19/17  Interventions for Short Term Goal #2  Reviewed  fall prevention measure of standing a few seconds prior to walking , do not get up without assistance , disucssed medical alert system.   [facility] CM Short Term Goal #3  Will report wearing compression hose daily in the next 30 days   [facility] CM Short Term Goal #3 Start Date  04/20/17  Interventions  for Short Tern Goal #3  Provided and reviewed emmi on orthostatic hypotension, reviewed how to apply hose.   [facility] CM Short Term Goal #4  Patient will  begin to weigh at least 3 days a week in the next 30 days   [facility] CM Short Term Goal #4 Start Date  04/20/17  Interventions for Short Term Goal #4  Education on benefit of tracking weights to identify sudden changes, reviewed weighing in am around same time after voiding, provided and reviewed how to use [facility] calendar to record weights.        [name], RN, [place] Management Coordinator  [phone]- Mobile [phone]- Toll Free Main Office

## 2017-04-21 ENCOUNTER — Emergency Department: Payer: Medicare Other

## 2017-04-21 ENCOUNTER — Inpatient Hospital Stay
Admission: EM | Admit: 2017-04-21 | Discharge: 2017-04-27 | DRG: 065 | Disposition: A | Discharge status: Hospice | Payer: Medicare Other | Attending: Internal Medicine | Admitting: Internal Medicine

## 2017-04-21 ENCOUNTER — Encounter: Payer: Self-pay | Admitting: Emergency Medicine

## 2017-04-21 ENCOUNTER — Observation Stay: Payer: Medicare Other

## 2017-04-21 DIAGNOSIS — I6529 Occlusion and stenosis of unspecified carotid artery: Secondary | ICD-10-CM | POA: Diagnosis not present

## 2017-04-21 DIAGNOSIS — Z9981 Dependence on supplemental oxygen: Secondary | ICD-10-CM | POA: Diagnosis not present

## 2017-04-21 DIAGNOSIS — Z01810 Encounter for preprocedural cardiovascular examination: Secondary | ICD-10-CM | POA: Diagnosis not present

## 2017-04-21 DIAGNOSIS — N400 Enlarged prostate without lower urinary tract symptoms: Secondary | ICD-10-CM | POA: Diagnosis not present

## 2017-04-21 DIAGNOSIS — R42 Dizziness and giddiness: Secondary | ICD-10-CM | POA: Diagnosis not present

## 2017-04-21 DIAGNOSIS — I951 Orthostatic hypotension: Secondary | ICD-10-CM | POA: Diagnosis present

## 2017-04-21 DIAGNOSIS — K219 Gastro-esophageal reflux disease without esophagitis: Secondary | ICD-10-CM | POA: Diagnosis not present

## 2017-04-21 DIAGNOSIS — J9611 Chronic respiratory failure with hypoxia: Secondary | ICD-10-CM | POA: Diagnosis present

## 2017-04-21 DIAGNOSIS — E662 Morbid (severe) obesity with alveolar hypoventilation: Secondary | ICD-10-CM | POA: Diagnosis present

## 2017-04-21 DIAGNOSIS — Z85828 Personal history of other malignant neoplasm of skin: Secondary | ICD-10-CM | POA: Diagnosis not present

## 2017-04-21 DIAGNOSIS — I63233 Cerebral infarction due to unspecified occlusion or stenosis of bilateral carotid arteries: Secondary | ICD-10-CM | POA: Diagnosis not present

## 2017-04-21 DIAGNOSIS — Z8679 Personal history of other diseases of the circulatory system: Secondary | ICD-10-CM | POA: Diagnosis not present

## 2017-04-21 DIAGNOSIS — I2510 Atherosclerotic heart disease of native coronary artery without angina pectoris: Secondary | ICD-10-CM | POA: Diagnosis not present

## 2017-04-21 DIAGNOSIS — Z833 Family history of diabetes mellitus: Secondary | ICD-10-CM | POA: Diagnosis not present

## 2017-04-21 DIAGNOSIS — I5042 Chronic combined systolic (congestive) and diastolic (congestive) heart failure: Secondary | ICD-10-CM | POA: Diagnosis not present

## 2017-04-21 DIAGNOSIS — Z9841 Cataract extraction status, right eye: Secondary | ICD-10-CM

## 2017-04-21 DIAGNOSIS — I480 Paroxysmal atrial fibrillation: Secondary | ICD-10-CM | POA: Diagnosis not present

## 2017-04-21 DIAGNOSIS — I4892 Unspecified atrial flutter: Secondary | ICD-10-CM | POA: Diagnosis present

## 2017-04-21 DIAGNOSIS — E785 Hyperlipidemia, unspecified: Secondary | ICD-10-CM | POA: Diagnosis not present

## 2017-04-21 DIAGNOSIS — Z9842 Cataract extraction status, left eye: Secondary | ICD-10-CM

## 2017-04-21 DIAGNOSIS — Z794 Long term (current) use of insulin: Secondary | ICD-10-CM

## 2017-04-21 DIAGNOSIS — Z951 Presence of aortocoronary bypass graft: Secondary | ICD-10-CM | POA: Diagnosis not present

## 2017-04-21 DIAGNOSIS — R9439 Abnormal result of other cardiovascular function study: Secondary | ICD-10-CM | POA: Diagnosis not present

## 2017-04-21 DIAGNOSIS — I6523 Occlusion and stenosis of bilateral carotid arteries: Secondary | ICD-10-CM | POA: Diagnosis not present

## 2017-04-21 DIAGNOSIS — R0602 Shortness of breath: Secondary | ICD-10-CM | POA: Diagnosis not present

## 2017-04-21 DIAGNOSIS — I255 Ischemic cardiomyopathy: Secondary | ICD-10-CM | POA: Diagnosis present

## 2017-04-21 DIAGNOSIS — E1151 Type 2 diabetes mellitus with diabetic peripheral angiopathy without gangrene: Secondary | ICD-10-CM | POA: Diagnosis not present

## 2017-04-21 DIAGNOSIS — Z950 Presence of cardiac pacemaker: Secondary | ICD-10-CM

## 2017-04-21 DIAGNOSIS — E1122 Type 2 diabetes mellitus with diabetic chronic kidney disease: Secondary | ICD-10-CM | POA: Diagnosis not present

## 2017-04-21 DIAGNOSIS — I25118 Atherosclerotic heart disease of native coronary artery with other forms of angina pectoris: Secondary | ICD-10-CM | POA: Diagnosis not present

## 2017-04-21 DIAGNOSIS — I208 Other forms of angina pectoris: Secondary | ICD-10-CM | POA: Diagnosis not present

## 2017-04-21 DIAGNOSIS — G8311 Monoplegia of lower limb affecting right dominant side: Secondary | ICD-10-CM | POA: Diagnosis present

## 2017-04-21 DIAGNOSIS — Z961 Presence of intraocular lens: Secondary | ICD-10-CM | POA: Diagnosis present

## 2017-04-21 DIAGNOSIS — I639 Cerebral infarction, unspecified: Secondary | ICD-10-CM

## 2017-04-21 DIAGNOSIS — Z9861 Coronary angioplasty status: Secondary | ICD-10-CM

## 2017-04-21 DIAGNOSIS — Z7901 Long term (current) use of anticoagulants: Secondary | ICD-10-CM

## 2017-04-21 DIAGNOSIS — I5043 Acute on chronic combined systolic (congestive) and diastolic (congestive) heart failure: Secondary | ICD-10-CM | POA: Diagnosis not present

## 2017-04-21 DIAGNOSIS — Z515 Encounter for palliative care: Secondary | ICD-10-CM | POA: Diagnosis not present

## 2017-04-21 DIAGNOSIS — R809 Proteinuria, unspecified: Secondary | ICD-10-CM | POA: Diagnosis not present

## 2017-04-21 DIAGNOSIS — I252 Old myocardial infarction: Secondary | ICD-10-CM | POA: Diagnosis not present

## 2017-04-21 DIAGNOSIS — R296 Repeated falls: Secondary | ICD-10-CM | POA: Diagnosis not present

## 2017-04-21 DIAGNOSIS — I272 Pulmonary hypertension, unspecified: Secondary | ICD-10-CM | POA: Diagnosis not present

## 2017-04-21 DIAGNOSIS — N184 Chronic kidney disease, stage 4 (severe): Secondary | ICD-10-CM | POA: Diagnosis not present

## 2017-04-21 DIAGNOSIS — Z8249 Family history of ischemic heart disease and other diseases of the circulatory system: Secondary | ICD-10-CM

## 2017-04-21 DIAGNOSIS — I10 Essential (primary) hypertension: Secondary | ICD-10-CM | POA: Diagnosis not present

## 2017-04-21 DIAGNOSIS — I130 Hypertensive heart and chronic kidney disease with heart failure and stage 1 through stage 4 chronic kidney disease, or unspecified chronic kidney disease: Secondary | ICD-10-CM | POA: Diagnosis not present

## 2017-04-21 DIAGNOSIS — I7025 Atherosclerosis of native arteries of other extremities with ulceration: Secondary | ICD-10-CM | POA: Diagnosis present

## 2017-04-21 DIAGNOSIS — I679 Cerebrovascular disease, unspecified: Secondary | ICD-10-CM | POA: Diagnosis not present

## 2017-04-21 DIAGNOSIS — Z6834 Body mass index (BMI) 34.0-34.9, adult: Secondary | ICD-10-CM

## 2017-04-21 DIAGNOSIS — D631 Anemia in chronic kidney disease: Secondary | ICD-10-CM | POA: Diagnosis not present

## 2017-04-21 DIAGNOSIS — R079 Chest pain, unspecified: Secondary | ICD-10-CM | POA: Diagnosis not present

## 2017-04-21 DIAGNOSIS — R0789 Other chest pain: Secondary | ICD-10-CM | POA: Diagnosis not present

## 2017-04-21 DIAGNOSIS — Z66 Do not resuscitate: Secondary | ICD-10-CM | POA: Diagnosis present

## 2017-04-21 DIAGNOSIS — E1140 Type 2 diabetes mellitus with diabetic neuropathy, unspecified: Secondary | ICD-10-CM | POA: Diagnosis present

## 2017-04-21 DIAGNOSIS — Z87891 Personal history of nicotine dependence: Secondary | ICD-10-CM

## 2017-04-21 DIAGNOSIS — I4891 Unspecified atrial fibrillation: Secondary | ICD-10-CM | POA: Diagnosis not present

## 2017-04-21 DIAGNOSIS — E559 Vitamin D deficiency, unspecified: Secondary | ICD-10-CM | POA: Diagnosis present

## 2017-04-21 DIAGNOSIS — Z7401 Bed confinement status: Secondary | ICD-10-CM | POA: Diagnosis not present

## 2017-04-21 DIAGNOSIS — Z86718 Personal history of other venous thrombosis and embolism: Secondary | ICD-10-CM | POA: Diagnosis not present

## 2017-04-21 DIAGNOSIS — L89150 Pressure ulcer of sacral region, unstageable: Secondary | ICD-10-CM | POA: Diagnosis present

## 2017-04-21 DIAGNOSIS — Z7189 Other specified counseling: Secondary | ICD-10-CM | POA: Diagnosis not present

## 2017-04-21 DIAGNOSIS — Z91013 Allergy to seafood: Secondary | ICD-10-CM

## 2017-04-21 DIAGNOSIS — H8110 Benign paroxysmal vertigo, unspecified ear: Secondary | ICD-10-CM | POA: Diagnosis present

## 2017-04-21 DIAGNOSIS — N183 Chronic kidney disease, stage 3 (moderate): Secondary | ICD-10-CM | POA: Diagnosis not present

## 2017-04-21 LAB — URINALYSIS, COMPLETE (UACMP) WITH MICROSCOPIC
Bacteria, UA: NONE SEEN
Bilirubin Urine: NEGATIVE
Glucose, UA: 150 mg/dL — AB
Ketones, ur: NEGATIVE mg/dL
Leukocytes, UA: NEGATIVE
Nitrite: NEGATIVE
PROTEIN: 100 mg/dL — AB
SPECIFIC GRAVITY, URINE: 1.013 (ref 1.005–1.030)
SQUAMOUS EPITHELIAL / LPF: NONE SEEN
pH: 5 (ref 5.0–8.0)

## 2017-04-21 LAB — BASIC METABOLIC PANEL
ANION GAP: 9 (ref 5–15)
BUN: 36 mg/dL — ABNORMAL HIGH (ref 6–20)
CALCIUM: 9.4 mg/dL (ref 8.9–10.3)
CHLORIDE: 104 mmol/L (ref 101–111)
CO2: 35 mmol/L — AB (ref 22–32)
CREATININE: 2.48 mg/dL — AB (ref 0.61–1.24)
GFR calc non Af Amer: 23 mL/min — ABNORMAL LOW (ref >60)
GFR, EST AFRICAN AMERICAN: 27 mL/min — AB (ref >60)
GLUCOSE: 83 mg/dL (ref 65–99)
Potassium: 3.6 mmol/L (ref 3.5–5.1)
Sodium: 148 mmol/L — ABNORMAL HIGH (ref 135–145)

## 2017-04-21 LAB — CBC
HCT: 34.3 % — ABNORMAL LOW (ref 40.0–52.0)
HEMOGLOBIN: 11.2 g/dL — AB (ref 13.0–18.0)
MCH: 31.3 pg (ref 26.0–34.0)
MCHC: 32.7 g/dL (ref 32.0–36.0)
MCV: 95.7 fL (ref 80.0–100.0)
Platelets: 156 10*3/uL (ref 150–440)
RBC: 3.58 MIL/uL — AB (ref 4.40–5.90)
RDW: 15.4 % — ABNORMAL HIGH (ref 11.5–14.5)
WBC: 7.1 10*3/uL (ref 3.8–10.6)

## 2017-04-21 LAB — GLUCOSE, CAPILLARY
GLUCOSE-CAPILLARY: 81 mg/dL (ref 65–99)
GLUCOSE-CAPILLARY: 92 mg/dL (ref 65–99)
GLUCOSE-CAPILLARY: 121 mg/dL — AB (ref 65–99)
Glucose-Capillary: 78 mg/dL (ref 65–99)
Glucose-Capillary: 139 mg/dL — ABNORMAL HIGH (ref 65–99)

## 2017-04-21 LAB — TROPONIN I
TROPONIN I: 0.04 ng/mL — AB (ref <0.03)
TROPONIN I: 0.04 ng/mL — AB (ref <0.03)

## 2017-04-21 MED ORDER — ACETAMINOPHEN 325 MG PO TABS: 650.0000 mg | ORAL_TABLET | Freq: Four times a day (QID) | ORAL | Status: DC | PRN

## 2017-04-21 MED ORDER — AMIODARONE HCL 200 MG PO TABS
200.0000 mg | ORAL_TABLET | Freq: Every day | ORAL | Status: DC
  Administered 2017-04-21 – 2017-04-27 (×7): 200 mg via ORAL
  Filled 2017-04-21 (×7): qty 1

## 2017-04-21 MED ORDER — ACETAMINOPHEN 650 MG RE SUPP: 650.0000 mg | Freq: Four times a day (QID) | RECTAL | Status: DC | PRN

## 2017-04-21 MED ORDER — POLYETHYLENE GLYCOL 3350 17 G PO PACK: 17.0000 g | PACK | Freq: Every day | ORAL | Status: DC | PRN

## 2017-04-21 MED ORDER — ORAL CARE MOUTH RINSE
15.0000 mL | Freq: Two times a day (BID) | OROMUCOSAL | Status: DC
  Administered 2017-04-21 – 2017-04-27 (×11): 15 mL via OROMUCOSAL

## 2017-04-21 MED ORDER — MELATONIN 5 MG PO TABS
2.5000 mg | ORAL_TABLET | Freq: Every day | ORAL | Status: DC
  Administered 2017-04-21 – 2017-04-26 (×6): 2.5 mg via ORAL
  Filled 2017-04-21 (×7): qty 0.5

## 2017-04-21 MED ORDER — ASPIRIN EC 81 MG PO TBEC
81.0000 mg | DELAYED_RELEASE_TABLET | Freq: Every day | ORAL | Status: DC
  Administered 2017-04-21 – 2017-04-27 (×7): 81 mg via ORAL
  Filled 2017-04-21 (×7): qty 1

## 2017-04-21 MED ORDER — ALBUTEROL SULFATE (2.5 MG/3ML) 0.083% IN NEBU
2.5000 mg | INHALATION_SOLUTION | RESPIRATORY_TRACT | Status: DC | PRN
Start: 2017-04-21 — End: 2017-04-24

## 2017-04-21 MED ORDER — CALCITRIOL 0.25 MCG PO CAPS
0.5000 ug | ORAL_CAPSULE | Freq: Every day | ORAL | Status: DC
  Administered 2017-04-21 – 2017-04-27 (×6): 0.5 ug via ORAL
  Filled 2017-04-21: qty 2
  Filled 2017-04-21: qty 1
  Filled 2017-04-21: qty 2
  Filled 2017-04-21 (×3): qty 1
  Filled 2017-04-21: qty 2

## 2017-04-21 MED ORDER — SODIUM CHLORIDE 0.9% FLUSH: 3.0000 mL | INTRAVENOUS | Status: DC | PRN

## 2017-04-21 MED ORDER — HYDROCORTISONE 10 MG PO TABS
10.0000 mg | ORAL_TABLET | Freq: Two times a day (BID) | ORAL | Status: DC
  Administered 2017-04-21 – 2017-04-27 (×12): 10 mg via ORAL
  Filled 2017-04-21 (×15): qty 1

## 2017-04-21 MED ORDER — ONDANSETRON HCL 4 MG PO TABS: 4.0000 mg | ORAL_TABLET | Freq: Four times a day (QID) | ORAL | Status: DC | PRN

## 2017-04-21 MED ORDER — ALBUTEROL SULFATE (2.5 MG/3ML) 0.083% IN NEBU
2.5000 mg | INHALATION_SOLUTION | Freq: Four times a day (QID) | RESPIRATORY_TRACT | Status: DC | PRN
  Administered 2017-04-21: 2.5 mg via RESPIRATORY_TRACT
  Filled 2017-04-21: qty 3

## 2017-04-21 MED ORDER — FLUDROCORTISONE ACETATE 0.1 MG PO TABS
0.1000 mg | ORAL_TABLET | Freq: Every day | ORAL | Status: DC
  Administered 2017-04-21 – 2017-04-27 (×6): 0.1 mg via ORAL
  Filled 2017-04-21 (×8): qty 1

## 2017-04-21 MED ORDER — INSULIN ASPART PROT & ASPART (70-30 MIX) 100 UNIT/ML ~~LOC~~ SUSP
5.0000 [IU] | Freq: Two times a day (BID) | SUBCUTANEOUS | Status: DC
  Administered 2017-04-21 – 2017-04-27 (×11): 5 [IU] via SUBCUTANEOUS
  Filled 2017-04-21 (×12): qty 10

## 2017-04-21 MED ORDER — APIXABAN 5 MG PO TABS
5.0000 mg | ORAL_TABLET | Freq: Two times a day (BID) | ORAL | Status: DC
  Administered 2017-04-21 – 2017-04-23 (×5): 5 mg via ORAL
  Filled 2017-04-21 (×5): qty 1

## 2017-04-21 MED ORDER — DOCUSATE SODIUM 100 MG PO CAPS
100.0000 mg | ORAL_CAPSULE | Freq: Two times a day (BID) | ORAL | Status: DC
  Administered 2017-04-21 – 2017-04-27 (×12): 100 mg via ORAL
  Filled 2017-04-21 (×12): qty 1

## 2017-04-21 MED ORDER — PRO-STAT SUGAR FREE PO LIQD
30.0000 mL | Freq: Two times a day (BID) | ORAL | Status: DC
Start: 2017-04-21 — End: 2017-04-27
  Administered 2017-04-22 – 2017-04-27 (×9): 30 mL via ORAL

## 2017-04-21 MED ORDER — PANTOPRAZOLE SODIUM 40 MG PO TBEC
40.0000 mg | DELAYED_RELEASE_TABLET | Freq: Every day | ORAL | Status: DC
  Administered 2017-04-21 – 2017-04-27 (×6): 40 mg via ORAL
  Filled 2017-04-21 (×6): qty 1

## 2017-04-21 MED ORDER — INSULIN ASPART 100 UNIT/ML ~~LOC~~ SOLN
0.0000 [IU] | Freq: Three times a day (TID) | SUBCUTANEOUS | Status: DC
  Administered 2017-04-21 – 2017-04-23 (×4): 1 [IU] via SUBCUTANEOUS
  Filled 2017-04-21 (×4): qty 1

## 2017-04-21 MED ORDER — ROSUVASTATIN CALCIUM 20 MG PO TABS
20.0000 mg | ORAL_TABLET | Freq: Every day | ORAL | Status: DC
  Administered 2017-04-21 – 2017-04-26 (×6): 20 mg via ORAL
  Filled 2017-04-21 (×6): qty 1

## 2017-04-21 MED ORDER — TRIAMCINOLONE ACETONIDE 55 MCG/ACT NA AERO
2.0000 | INHALATION_SPRAY | Freq: Every day | NASAL | Status: DC
  Administered 2017-04-21 – 2017-04-27 (×7): 2 via NASAL
  Filled 2017-04-21: qty 21.6

## 2017-04-21 MED ORDER — SODIUM CHLORIDE 0.9 % IV BOLUS (SEPSIS)
1000.0000 mL | Freq: Once | INTRAVENOUS | Status: AC
  Administered 2017-04-21: 1000 mL via INTRAVENOUS

## 2017-04-21 MED ORDER — FINASTERIDE 5 MG PO TABS
5.0000 mg | ORAL_TABLET | ORAL | Status: DC
  Administered 2017-04-21 – 2017-04-26 (×6): 5 mg via ORAL
  Filled 2017-04-21 (×6): qty 1

## 2017-04-21 MED ORDER — SODIUM CHLORIDE 0.9 % IV SOLN: 250.0000 mL | INTRAVENOUS | Status: DC | PRN

## 2017-04-21 MED ORDER — DOCUSATE SODIUM 100 MG PO CAPS: 100.0000 mg | ORAL_CAPSULE | Freq: Two times a day (BID) | ORAL | Status: DC

## 2017-04-21 MED ORDER — MELATONIN 3 MG PO TABS: 1.0000 | ORAL_TABLET | Freq: Every day | ORAL | Status: DC

## 2017-04-21 MED ORDER — ONDANSETRON HCL 4 MG/2ML IJ SOLN: 4.0000 mg | Freq: Four times a day (QID) | INTRAMUSCULAR | Status: DC | PRN

## 2017-04-21 MED ORDER — SODIUM CHLORIDE 0.9% FLUSH
3.0000 mL | Freq: Two times a day (BID) | INTRAVENOUS | Status: DC
  Administered 2017-04-21 – 2017-04-27 (×13): 3 mL via INTRAVENOUS

## 2017-04-21 NOTE — Progress Notes (Signed)
PT Cancellation Note  Patient Details Name: Marco Robart Sr. MRN: [id number] DOB: 1936/02/20   Cancelled Treatment:    Reason Eval/Treat Not Completed: Patient at procedure or test/unavailable (Chart reviewed, RN consulted: Pt being taken off floor for imaging study. Will attempt again at later date/time. )   1:59 PM, 04/21/17 Etta Grandchild, PT, DPT Physical Therapist - [facility])  [phone] (mobile)     Carmie Lanpher C 04/21/2017, 1:59 PM

## 2017-04-21 NOTE — ED Notes (Signed)
Pt. States upon waking at around midnight while in recliner pt. States feeling dizzy and sweating.  Pt. States he was recently discharged from [facility] rehab facility.  Pt. States he was here at this hospital about 3 weeks ago for low blood pressure.

## 2017-04-21 NOTE — ED Triage Notes (Signed)
Pt. States upon waking at around midnight, pt. States feeling weak and sweating.

## 2017-04-21 NOTE — Consult Note (Addendum)
Reason for Consult: R leg weakness  Referring Physician: Dr. Darvin [name]  CC: R leg weakness   HPI: Marco Kimmey Sr. is an 81 y.o. male known history of Diabetes, hypertension, congestive heart failure, DVT, CKD stage III, chronic orthostatic hypotension presents to the emergency room complaining of weakness and acute onset of dizziness. Dizziness described as positional occurring when turning or sitting up. Patient's wife has also noticed that he is weaker on the right leg that has been present since Tuesday last week.. Patient does not remember when this started. He has had orthostatic hypotension and his blood pressure medications have been stopped over the last 2 admissions.No anti platelet therapy at home.  Pt has PPM   Past Medical History:  Diagnosis Date  . AAA (abdominal aortic aneurysm)repaired    a. 12/2006 s/p repair of 6cm infrarenal AAA with 68mm Dacron graft.  . Anemia   . Anginal pain ([place])   . Atrial fibrillation/Flutter    a. Dx 2014;  b. 10/2012 s/p TEE/DCCV;  c. On amio and Eliquis (CHA2DS2VASc = 6).  . Basal cell carcinoma    a. 2013 x 2 back and left neck  . BPH (benign prostatic hypertrophy)   . Carotid artery occlusion    a. 11/2008 s/p R CEA; b. 01/2017 Carotid U/S: RICA <09%, LICA >32%.  . Chronic systolic heart failure ([place])    a. 02/2012 Echo: EF 30-35%, Pulm HTN, and modest RV dysfunction;  b. 10/2012 TEE: EF 25-30%, mild conc LVH, diff HK, mod RV dysfxn, mod dil RA/LA, mild MR, no MS, nl MVR, mod TR, Ao sclerosis; c. 01/2017 Echo: EF 45-50%, diff HK, triv MR, mod dil LA.  . CKD (chronic kidney disease), stage IV ([place])   . Coronary artery disease    a. 1999 PCI RCA;  b. 01/2005 NSTEMI: CABG x 3 (LIMA->LAD, VG->OM, VG->LPL) @ time of MVR.  . Diabetes mellitus, insulin dependent (IDDM), controlled ([place])   . DVT (deep venous thrombosis) ([place])   . GERD (gastroesophageal reflux disease)   . Heart disease   . History of hiatal hernia   . History of shingles   .  Hyperlipidemia   . Hyperlipidemia, unspecified   . Hypertension   . Hypertensive heart disease   . Mitral regurgitation--s/p repair    a. 01/2005 MVR: 28-mm Edwards ET Logix ring annuloplasty; b. 10/2012 TEE: normal fxning MV ring with mild MR, no MS; 01/2017 Echo: Triv MR.  . Mixed Ischemic and Non-ischemic Cardiomyopathy    a. 02/2012 Echo: EF 30-35%, Pulm HTN, and modest RV dysfunction;  b. 10/2012 TEE: EF 25-30%, mild conc LVH, diff HK, mod RV dysfxn, mod dil RA/LA, mild MR, no MS, nl MVR, mod TR, Ao sclerosis; c. 01/2017 Echo: EF 45-50%.  . Myocardial infarction ([place])    2006  . Neuropathy    SOME  . Obesity   . Onychocryptosis   . Onychomycosis   . Orthopnea   . Orthostatic hypotension    a. 11/2016 -> meds reduced;  b. 01/2017 admitted w/ presyncope and orthostasis->Meds held.  . Oxygen deficit    a. Wears 2 lpm most of the day and @ HS.  . Paresthesias    in the feet  . Paronychia of third toe, right    contusion with paronychia right third toe  . Presence of permanent cardiac pacemaker   . PVC (premature ventricular contraction)    a. 02/2009 s/p RFCA ([place]).  . Sick sinus syndrome ([place])  a. 01/2005 s/p PPM 2/2 heart block following CABG;  b. 05/2012 Gen Change: MDT Adapta ADDRL1 DC PPM, ser # [id number].  . Sleep apnea    a. Intolerant of CPAP  . Type II diabetes mellitus ([place])     Past Surgical History:  Procedure Laterality Date  . ABDOMINAL AORTIC ANEURYSM REPAIR      May 2008,   . CARDIAC CATHETERIZATION  2006   @ [facility]  . CATARACT EXTRACTION     right  . CATARACT EXTRACTION W/PHACO Left 10/04/2016   Procedure: CATARACT EXTRACTION PHACO AND INTRAOCULAR LENS PLACEMENT (IOC);  Surgeon: Estill Cotta, MD;  Location: [facility] ORS;  Service: Ophthalmology;  Laterality: Left;  [place] 01:21 AP% 25.9 CDE 39.08 Fluid pack lot # [id number]   . CORONARY ARTERY BYPASS GRAFT     CABG with a  LIMA to the LAD, SVG to circumflex, SVG to posterolateral [id number].  Randolm Idol / REPLACE  / REMOVE PACEMAKER  2006   Dr. Caryl [name]  . MITRAL VALVE REPAIR  2006    mitral valve repair with a #28 Edwards logic ring  . PACEMAKER PLACEMENT    . PERMANENT PACEMAKER GENERATOR CHANGE N/A 06/26/2012   Procedure: PERMANENT PACEMAKER GENERATOR CHANGE;  Surgeon: Marco Sprang, MD;  Location: [facility] CATH LAB;  Service: Cardiovascular;  Laterality: N/A;  . SKIN BIOPSY     left wrist; right elbow  . SKIN CANCER EXCISION      x 6  . SKIN CANCER EXCISION    . TRANSESOPHAGEAL ECHOCARDIOGRAM    . VASECTOMY      Family History  Problem Relation Age of Onset  . Heart attack Mother 9  . Diabetes Sister   . Hypertension Sister   . Heart attack Sister   . Heart attack Brother 27       3 Brothers deceased from [place] in 54's or 76's  . Heart disease Brother        Heart Disease before age 74  . Heart attack Father 27  . Diabetes Father        Amputation: bilateral feet  . Heart disease Father   . Heart attack Sister   . Heart attack Sister   . Heart attack Brother 57  . Heart attack Brother 12  . Diabetes Son     Social History:  reports that he quit smoking about 29 years ago. His smoking use included Cigarettes. He has a 51.00 pack-year smoking history. He has never used smokeless tobacco. He reports that he drinks about 0.6 oz of alcohol per week . He reports that he does not use drugs.  Allergies  Allergen Reactions  . Shellfish Allergy Anaphylaxis    Medications: I have reviewed the patient's current medications.  ROS: History obtained from the patient  General ROS: negative for - chills, fatigue, fever, night sweats, weight gain or weight loss Psychological ROS: negative for - behavioral disorder, hallucinations, memory difficulties, mood swings or suicidal ideation Ophthalmic ROS: negative for - blurry vision, double vision, eye pain or loss of vision ENT ROS: negative for - epistaxis, nasal discharge, oral lesions, sore throat, tinnitus or vertigo Allergy and Immunology ROS:  negative for - hives or itchy/watery eyes Hematological and Lymphatic ROS: negative for - bleeding problems, bruising or swollen lymph nodes Endocrine ROS: negative for - galactorrhea, hair pattern changes, polydipsia/polyuria or temperature intolerance Respiratory ROS: negative for - cough, hemoptysis, shortness of breath or wheezing Cardiovascular ROS: negative for - chest pain, dyspnea on exertion, edema  or irregular heartbeat Gastrointestinal ROS: negative for - abdominal pain, diarrhea, hematemesis, nausea/vomiting or stool incontinence Genito-Urinary ROS: negative for - dysuria, hematuria, incontinence or urinary frequency/urgency Musculoskeletal ROS: negative for - joint swelling or muscular weakness Neurological ROS: as noted in HPI Dermatological ROS: negative for rash and skin lesion changes  Physical Examination: Blood pressure (!) 175/89, pulse 75, temperature 97.6 F (36.4 C), temperature source Oral, resp. rate (!) 24, height 6' (1.829 m), weight 113.9 kg (251 lb), SpO2 100 %.  Neurological Examination   Mental Status: Alert, oriented, thought content appropriate.  Speech fluent without evidence of aphasia.  Able to follow 3 step commands without difficulty. Cranial Nerves: II: Discs flat bilaterally; Visual fields grossly normal, pupils equal, round, reactive to light and accommodation III,IV, VI: ptosis not present, extra-ocular motions intact bilaterally V,VII: smile symmetric, facial light touch sensation normal bilaterally VIII: hearing normal bilaterally IX,X: gag reflex present XI: bilateral shoulder shrug XII: midline tongue extension Motor: Right : Upper extremity   4+/5    Left:     Upper extremity   4+/5  Lower extremity   4+/5     Lower extremity   4+/5 Tone and bulk:normal tone throughout; no atrophy noted Sensory: Pinprick and light touch intact throughout, bilaterally Deep Tendon Reflexes: 2+ and symmetric throughout Plantars: Right: downgoing   Left:  downgoing Cerebellar: normal finger-to-nose, normal rapid alternating movements and normal heel-to-shin test Gait: not tested       Laboratory Studies:   Basic Metabolic Panel:  Recent Labs Lab 04/21/17 0117  NA 148*  K 3.6  CL 104  CO2 35*  GLUCOSE 83  BUN 36*  CREATININE 2.48*  CALCIUM 9.4    Liver Function Tests: No results for input(s): AST, ALT, ALKPHOS, BILITOT, PROT, ALBUMIN in the last 168 hours. No results for input(s): LIPASE, AMYLASE in the last 168 hours. No results for input(s): AMMONIA in the last 168 hours.  CBC:  Recent Labs Lab 04/21/17 0117  WBC 7.1  HGB 11.2*  HCT 34.3*  MCV 95.7  PLT 156    Cardiac Enzymes:  Recent Labs Lab 04/21/17 0117 04/21/17 0444  TROPONINI 0.04* 0.04*    BNP: Invalid input(s): POCBNP  CBG:  Recent Labs Lab 04/21/17 0111 04/21/17 0208 04/21/17 0448  GLUCAP 78 85 92    Microbiology: Results for orders placed or performed during the hospital encounter of 03/29/17  Urine Culture     Status: Abnormal   Collection Time: 03/29/17  4:00 PM  Result Value Ref Range Status   Specimen Description URINE, RANDOM  Final   Special Requests NONE  Final   Culture (A)  Final    <10,000 COLONIES/mL INSIGNIFICANT GROWTH Performed at [facility], [place] [address], [place], [place] [address]    Report Status 03/31/2017 FINAL  Final    Coagulation Studies: No results for input(s): LABPROT, INR in the last 72 hours.  Urinalysis: No results for input(s): COLORURINE, LABSPEC, PHURINE, GLUCOSEU, HGBUR, BILIRUBINUR, KETONESUR, PROTEINUR, UROBILINOGEN, NITRITE, LEUKOCYTESUR in the last 168 hours.  Invalid input(s): APPERANCEUR  Lipid Panel:     Component Value Date/Time   CHOL 114 02/02/2017 0751   CHOL 141 06/02/2016 0839   TRIG 56 02/02/2017 0751   TRIG 94 11/04/2008 0000   HDL 37 (L) 02/02/2017 0751   HDL 42 06/02/2016 0839   CHOLHDL 3.1 02/02/2017 0751   VLDL 11 02/02/2017 0751   LDLCALC 66 02/02/2017  0751   LDLCALC 78 06/02/2016 0839  HgbA1C:  Lab Results  Component Value Date   HGBA1C 6.1 (H) 03/16/2017    Urine Drug Screen:  No results found for: LABOPIA, COCAINSCRNUR, LABBENZ, AMPHETMU, THCU, LABBARB  Alcohol Level: No results for input(s): ETH in the last 168 hours.  Other results: EKG: normal EKG, normal sinus rhythm, unchanged from previous tracings.  Imaging: Ct Head Wo Contrast  Result Date: 04/21/2017 CLINICAL DATA:  Weakness, dizziness and diaphoresis. EXAM: CT HEAD WITHOUT CONTRAST TECHNIQUE: Contiguous axial images were obtained from the base of the skull through the vertex without intravenous contrast. COMPARISON:  03/08/2017. FINDINGS: Brain: Diffusely enlarged ventricles and subarachnoid spaces. Patchy white matter low density in both cerebral hemispheres. Interval right caudate head and body lacunar infarcts. Stable old right inferior basal ganglia lacunar infarct or prominent perivascular space. No intracranial hemorrhage, mass lesion or CT evidence of acute infarction. Vascular: No hyperdense vessel or unexpected calcification. Skull: Stable right posterior parietal osteoma. Sinuses/Orbits: Status post bilateral cataract extraction. Unremarkable paranasal sinuses. Other: None. IMPRESSION: 1. No acute abnormality. 2. Interval visualization of old right caudate lacunar infarcts. 3. Stable old right basal ganglia lacunar infarct or prominent perivascular space. 4. Stable mild diffuse cerebral and cerebellar atrophy. 5. Stable mild chronic small vessel white matter ischemic changes in both cerebral hemispheres. Electronically Signed   By: Claudie Revering M.D.   On: 04/21/2017 02:13     Assessment/Plan:   81 y.o. male known history of Diabetes, hypertension, congestive heart failure, DVT, CKD stage III, chronic orthostatic hypotension presents to the emergency room complaining of weakness and acute onset of dizziness. Dizziness described as positional occurring when turning  or sitting up. Patient's wife has also noticed that he is weaker on the right leg that has been present since Tuesday last week.. Patient does not remember when this started. He has had orthostatic hypotension and his blood pressure medications have been stopped over the last 2 admissions.No anti platelet therapy at home.  Pt has PPM   CTH reviewed and no acute abnormality Unable to obtain MRI due to PPM Pt has elevated creat and CKD stage III so unable to obtain CTA Will order US carotid and repeat CTH for AM.   Pt was started on ASA as he was not on any anti platelet therpay Pt/ot Dizziness is positional and related to orthostatic hypotension/ BPPV   04/21/2017, 1:09 PM

## 2017-04-21 NOTE — Care Management Obs Status (Signed)
[facility] NOTIFICATION   Patient Details  Name: Marco Cott Sr. MRN: [id number] Date of Birth: 1935/11/09   Medicare Observation Status Notification Given:  Yes    Nayleah Gamel A, RN 04/21/2017, 2:39 PM

## 2017-04-21 NOTE — ED Notes (Signed)
Pt transported to room 107 

## 2017-04-21 NOTE — ED Provider Notes (Signed)
[facility] Emergency Department Provider Note   ____________________________________________   First MD Initiated Contact with Patient 04/21/17 0110     (approximate)  I have reviewed the triage vital signs and the nursing notes.   HISTORY  Chief Complaint Dizziness    HPI Marco Eslinger Sr. is a 81 y.o. male Who comes into the hospital today with some dizziness. He reports that he almost passed out. He woke up with some cold sweats and thoughte his blood sugar was low. He took a sugar pill and ate a piece of candy. When EMS arrived his blood sugar was 90. He kept feeling dizzy and thought he was given a pass out. He denies feeling the room spinning but says he was seeing spots in front of his eyes. He felt like he was given a collapsed on the floor. The patient denies any chest pain but he has had some shortness of breath which she states is normal for him. He wears 2 L by nasal cannula chronically. He denies any nausea vomiting or abdominal pain. He's had no headache. The patient was concerned about the symptoms that decided to come into the hospital today for evaluation.   Past Medical History:  Diagnosis Date  . AAA (abdominal aortic aneurysm)repaired    a. 12/2006 s/p repair of 6cm infrarenal AAA with 34mm Dacron graft.  . Anemia   . Anginal pain ([place])   . Atrial fibrillation/Flutter    a. Dx 2014;  b. 10/2012 s/p TEE/DCCV;  c. On amio and Eliquis (CHA2DS2VASc = 6).  . Basal cell carcinoma    a. 2013 x 2 back and left neck  . BPH (benign prostatic hypertrophy)   . Carotid artery occlusion    a. 11/2008 s/p R CEA; b. 01/2017 Carotid U/S: RICA <16%, LICA >10%.  . Chronic systolic heart failure ([place])    a. 02/2012 Echo: EF 30-35%, Pulm HTN, and modest RV dysfunction;  b. 10/2012 TEE: EF 25-30%, mild conc LVH, diff HK, mod RV dysfxn, mod dil RA/LA, mild MR, no MS, nl MVR, mod TR, Ao sclerosis; c. 01/2017 Echo: EF 45-50%, diff HK, triv MR, mod dil LA.    . CKD (chronic kidney disease), stage IV ([place])   . Coronary artery disease    a. 1999 PCI RCA;  b. 01/2005 NSTEMI: CABG x 3 (LIMA->LAD, VG->OM, VG->LPL) @ time of MVR.  . Diabetes mellitus, insulin dependent (IDDM), controlled ([place])   . DVT (deep venous thrombosis) ([place])   . GERD (gastroesophageal reflux disease)   . Heart disease   . History of hiatal hernia   . History of shingles   . Hyperlipidemia   . Hyperlipidemia, unspecified   . Hypertension   . Hypertensive heart disease   . Mitral regurgitation--s/p repair    a. 01/2005 MVR: 28-mm Edwards ET Logix ring annuloplasty; b. 10/2012 TEE: normal fxning MV ring with mild MR, no MS; 01/2017 Echo: Triv MR.  . Mixed Ischemic and Non-ischemic Cardiomyopathy    a. 02/2012 Echo: EF 30-35%, Pulm HTN, and modest RV dysfunction;  b. 10/2012 TEE: EF 25-30%, mild conc LVH, diff HK, mod RV dysfxn, mod dil RA/LA, mild MR, no MS, nl MVR, mod TR, Ao sclerosis; c. 01/2017 Echo: EF 45-50%.  . Myocardial infarction ([place])    2006  . Neuropathy    SOME  . Obesity   . Onychocryptosis   . Onychomycosis   . Orthopnea   . Orthostatic hypotension    a. 11/2016 ->  meds reduced;  b. 01/2017 admitted w/ presyncope and orthostasis->Meds held.  . Oxygen deficit    a. Wears 2 lpm most of the day and @ HS.  . Paresthesias    in the feet  . Paronychia of third toe, right    contusion with paronychia right third toe  . Presence of permanent cardiac pacemaker   . PVC (premature ventricular contraction)    a. 02/2009 s/p RFCA ([place]).  . Sick sinus syndrome ([place])    a. 01/2005 s/p PPM 2/2 heart block following CABG;  b. 05/2012 Gen Change: MDT Adapta ADDRL1 DC PPM, ser # [id number].  . Sleep apnea    a. Intolerant of CPAP  . Type II diabetes mellitus [facility])     Patient Active Problem List   Diagnosis Date Noted  . Peripheral vascular disease of lower extremity with ulceration ([place]) 04/02/2017  . Skin ulcer of second toe of left foot with fat layer exposed ([place])  04/02/2017  . Skin ulcer of second toe of right foot ([place]) 04/02/2017  . Non-healing non-surgical wound 04/02/2017  . Hypoalbuminemia due to protein-calorie malnutrition ([place]) 04/02/2017  . Vitamin D deficiency 04/02/2017  . Adrenal insufficiency (Addison's disease) ([place]) 04/02/2017  . Near syncope 02/02/2017  . Pressure injury of skin 02/02/2017  . Orthostasis 12/04/2016  . LGI bleed 06/10/2016  . Acute blood loss anemia 06/10/2016  . Chronic respiratory failure with hypoxia, on home O2 therapy ([place]) 06/10/2016  . PAD (peripheral artery disease) ([place]) 06/05/2016  . Chest pain 10/28/2015  . Gastroenteritis   . Diarrhea of infectious origin   . Emesis   . S/P CABG (coronary artery bypass graft)   . Coronary artery disease involving native coronary artery of native heart without angina pectoris   . PAF (paroxysmal atrial fibrillation) ([place])   . Absolute anemia 01/28/2015  . Atherosclerosis of coronary artery 01/28/2015  . Barrett's esophagus 01/28/2015  . Basal cell carcinoma of face 01/28/2015  . Benign fibroma of prostate 01/28/2015  . Diabetes ([place]) 01/28/2015  . Gastro-esophageal reflux disease without esophagitis 01/28/2015  . Irritable colon 01/28/2015  . Pleural cavity effusion 01/28/2015  . Apnea, sleep 01/28/2015  . Hematuria 12/21/2014  . Left hand weakness 09/23/2013  . Essential hypertension 06/23/2013  . Chronic combined systolic and diastolic CHF (congestive heart failure) ([place]) 06/10/2013  . Hypotension, postural 06/10/2013  . Hyperkalemia 06/10/2013  . Adjustment of cardiac pacemaker 06/10/2013  . Atrial fibrillation and flutter ([place]) 10/22/2012  . Pacemaker-Medtronic 06/19/2012  . Pulmonary hypertension ([place]) 03/18/2012  . Occlusion and stenosis of carotid artery without mention of cerebral infarction 02/08/2012  . Morbid obesity ([place]) 08/05/2010  . Sinoatrial node dysfunction ([facility]) 06/02/2010  . Stage 4 chronic renal impairment associated with type 2  diabetes mellitus ([place]) 08/09/2009  . AAA 04/21/2009  . Ischemic cardiomyopathy  s/p CABG 11/23/2008  . MITRAL REGURGITATION 11/22/2008  . CAD (coronary artery disease) 11/22/2008  . Hyperlipidemia 03/02/2008  . ALLERGIC RHINITIS 03/02/2008  . OSA (obstructive sleep apnea) 03/02/2008    Past Surgical History:  Procedure Laterality Date  . ABDOMINAL AORTIC ANEURYSM REPAIR      May 2008,   . CARDIAC CATHETERIZATION  2006   @ [facility]  . CATARACT EXTRACTION     right  . CATARACT EXTRACTION W/PHACO Left 10/04/2016   Procedure: CATARACT EXTRACTION PHACO AND INTRAOCULAR LENS PLACEMENT (IOC);  Surgeon: Estill Cotta, MD;  Location: [facility] ORS;  Service: Ophthalmology;  Laterality: Left;  [place] 01:21 AP% 25.9 CDE 39.08  Fluid pack lot # [id number]   . CORONARY ARTERY BYPASS GRAFT     CABG with a  LIMA to the LAD, SVG to circumflex, SVG to posterolateral [id number].  Randolm Idol / REPLACE / REMOVE PACEMAKER  2006   Dr. [name]  . MITRAL VALVE REPAIR  2006    mitral valve repair with a #28 Edwards logic ring  . PACEMAKER PLACEMENT    . PERMANENT PACEMAKER GENERATOR CHANGE N/A 06/26/2012   Procedure: PERMANENT PACEMAKER GENERATOR CHANGE;  Surgeon: Deboraha Sprang, MD;  Location: [facility] CATH LAB;  Service: Cardiovascular;  Laterality: N/A;  . SKIN BIOPSY     left wrist; right elbow  . SKIN CANCER EXCISION      x 6  . SKIN CANCER EXCISION    . TRANSESOPHAGEAL ECHOCARDIOGRAM    . VASECTOMY      Prior to Admission medications   Medication Sig Start Date End Date Taking? Authorizing Provider  ACCU-CHEK FASTCLIX LANCETS MISC TEST twice a day if needed 07/04/16   [name], MD  ACCU-CHEK SMARTVIEW test strip TEST twice a day 07/05/16   [name], MD  albuterol (PROVENTIL) (2.5 MG/3ML) 0.083% nebulizer solution Take 3 mLs (2.5 mg total) by nebulization every 6 (six) hours as needed for wheezing or shortness of breath. Patient not taking: Reported on 04/20/2017 02/07/17   Loletha Grayer,  MD  Amino Acids-Protein Hydrolys (FEEDING SUPPLEMENT, PRO-STAT SUGAR FREE 64,) LIQD Take 30 mLs by mouth 2 (two) times daily between meals.    [provider]  amiodarone (PACERONE) 200 MG tablet take 1 tablet by mouth once daily 01/08/17   Minna Merritts, MD  bacitracin 500 UNIT/GM ointment Apply 1 application topically daily. Apply to tip of 2nd toe    [provider]  BD INSULIN SYRINGE ULTRAFINE 31G X 5/16" 0.3 ML MISC use twice a day as directed 04/03/16   [name], MD  calcitRIOL (ROCALTROL) 0.5 MCG capsule Take 0.5 mcg by mouth daily.    [provider]  Cholecalciferol (VITAMIN D3) 2000 units capsule Take 2,000 Units by mouth daily.    [provider]  collagenase (SANTYL) ointment Apply 1 application topically daily. Apply a thick layer topically to wound on left chest, cover with ns damp to dry gauze, cover with Allevyn. Change gauze daily. Change Allevyn every 3 days.    [provider]  dextrose (GLUTOSE) 40 % GEL Take 1 Tube by mouth once as needed for low blood sugar. Give 1 tube every 30 minutes orally as needed until BS > 70 and pt is still responsive.    [provider]  docusate sodium (COLACE) 100 MG capsule Take 100 mg by mouth 2 (two) times daily.     [provider]  ELIQUIS 5 MG TABS tablet take 1 tablet by mouth twice a day 07/31/16   [name], MD  finasteride (PROSCAR) 5 MG tablet Take 1 tablet (5 mg total) by mouth daily after supper. 03/20/17   Vaughan Basta, MD  fludrocortisone (FLORINEF) 0.1 MG tablet Take 0.1 mg by mouth daily.    [provider]  glucagon (GLUCAGON EMERGENCY) 1 MG injection Inject 1 mg into the vein once as needed. Give 1 mg for hypoglycemia (symptomatic) not responding to oral glucose, snack. May repeat x 1 after 15 minutes. USE ONLY IF PT IS OR IS BECOMING UNRESPONSIVE.    [provider]  GLUCERNA Barrie Folk) LIQD Take 1 Can by  mouth 2  (two) times daily between meals.    [provider]  hydrocortisone (CORTEF) 10 MG tablet Take 10 mg by mouth 2 (two) times daily.    [provider]  insulin lispro protamine-lispro (HUMALOG MIX 75/25) (75-25) 100 UNIT/ML SUSP injection Inject 5 Units into the skin 2 (two) times daily with a meal. Patient taking differently: Inject 5 Units into the skin 2 (two) times daily with a meal. Taking 10 units 02/07/17   Wieting, Richard, MD  Melatonin 3 MG TABS Take 1 tablet by mouth at bedtime.    [provider]  Multiple Vitamins-Minerals (MULTIVITAL) tablet Take 1 tablet by mouth daily.    [provider]  omeprazole (PRILOSEC) 20 MG capsule Take 20 mg by mouth daily. Monday, Wednesday, friday     [provider]  OXYGEN Inhale 3 L into the lungs continuous.     [provider]  polyethylene glycol (MIRALAX / GLYCOLAX) packet Take 17 g by mouth daily as needed for mild constipation. 06/12/16   Fritzi Mandes, MD  rosuvastatin (CRESTOR) 20 MG tablet take 1 tablet by mouth at bedtime 10/02/16   Minna Merritts, MD  Skin Protectants, Misc. (ENDIT EX) Apply liberal amount topically to gluteal fold wound and surrounding tissue 2 times daily and as needed. Do not scrub skin. If soiled, wipe off top layer and add more cream.    [provider]  triamcinolone (NASACORT ALLERGY 24HR) 55 MCG/ACT AERO nasal inhaler Place 2 sprays into the nose daily.     [provider]  Zinc Oxide (BOUDREAUXS BUTT PASTE) 16 % OINT Apply liberal amount topically to areas of skin irritation 2 times daily and as needed. Ok to leave at bedside.    [provider]  zolpidem (AMBIEN) 5 MG tablet Take 5 mg by mouth at bedtime as needed for sleep.    [provider]    Allergies Shellfish allergy  Family History  Problem Relation Age of Onset  . Heart attack Mother 35  . Diabetes Sister   . Hypertension Sister   . Heart attack Sister   . Heart  attack Brother 35       3 Brothers deceased from [place] in 31's or 32's  . Heart disease Brother        Heart Disease before age 53  . Heart attack Father 10  . Diabetes Father        Amputation: bilateral feet  . Heart disease Father   . Heart attack Sister   . Heart attack Sister   . Heart attack Brother 53  . Heart attack Brother 50  . Diabetes Son     Social History Social History  Substance Use Topics  . Smoking status: Former Smoker    Packs/day: 1.50    Years: 34.00    Types: Cigarettes    Quit date: 08/28/1987  . Smokeless tobacco: Never Used  . Alcohol use 0.6 oz/week    1 Cans of beer per week     Comment: Occasionally but none since his surgery in June    Review of Systems  Constitutional: No fever/chills Eyes: No visual changes. ENT: No sore throat. Cardiovascular: Denies chest pain. Respiratory: shortness of breath. Gastrointestinal: No abdominal pain.  No nausea, no vomiting.  No diarrhea.  No constipation. Genitourinary: Negative for dysuria. Musculoskeletal: Negative for back pain. Skin: Negative for rash. Neurological: dizziness   ____________________________________________   PHYSICAL EXAM:  VITAL SIGNS: ED Triage Vitals  Enc Vitals Group     BP 04/21/17 0116 (!) 191/97     Pulse Rate 04/21/17 0116 62     Resp 04/21/17 0130 13     Temp 04/21/17 0116 97.7 F (36.5 C)     Temp Source 04/21/17 0116 Oral     SpO2 04/21/17 0115 100 %     Weight 04/21/17 0117 243 lb (110.2 kg)     Height 04/21/17 0117 6' (1.829 m)     Head Circumference --      Peak Flow --      Pain Score --      Pain Loc --      Pain Edu? --      Excl. in [place]? --     Constitutional: Alert and oriented. Well appearing and in mild distress. Eyes: Conjunctivae are normal. PERRL. EOMI. Head: Atraumatic. Nose: No congestion/rhinnorhea. Mouth/Throat: Mucous membranes are moist.  Oropharynx non-erythematous. Cardiovascular: Normal rate, regular rhythm. Grossly normal heart  sounds.  Good peripheral circulation. Respiratory: Normal respiratory effort.  No retractions. Lungs CTAB. Gastrointestinal: Soft and nontender. No distention.  Musculoskeletal: No lower extremity tenderness nor edema.   Neurologic:  Normal speech and language. Cranial nerves II through XII are grossly intact with no focal motor or neuro deficits Skin:  Skin is warm, dry and intact.  Psychiatric: Mood and affect are normal.   ____________________________________________   LABS (all labs ordered are listed, but only abnormal results are displayed)  Labs Reviewed  BASIC METABOLIC PANEL - Abnormal; Notable for the following:       Result Value   Sodium 148 (*)    CO2 35 (*)    BUN 36 (*)    Creatinine, Ser 2.48 (*)    GFR calc non Af Amer 23 (*)    GFR calc Af Amer 27 (*)    All other components within normal limits  CBC - Abnormal; Notable for the following:    RBC 3.58 (*)    Hemoglobin 11.2 (*)    HCT 34.3 (*)    RDW 15.4 (*)    All other components within normal limits  TROPONIN I - Abnormal; Notable for the following:    Troponin I 0.04 (*)    All other components within normal limits  TROPONIN I - Abnormal; Notable for the following:    Troponin I 0.04 (*)    All other components within normal limits  GLUCOSE, CAPILLARY  GLUCOSE, CAPILLARY  GLUCOSE, CAPILLARY  URINALYSIS, COMPLETE (UACMP) WITH MICROSCOPIC  CBG MONITORING, ED  CBG MONITORING, ED   ____________________________________________  EKG  ED ECG REPORT I, Loney Hering, the attending physician, personally viewed and interpreted this ECG.   Date: 04/21/2017  EKG Time: 118  Rate: 60  Rhythm: atrial paced  Axis: left axis deviation  Intervals:nonspecific intraventricular conduction delay  ST&T Change: none  ____________________________________________  RADIOLOGY  Ct Head Wo Contrast  Result Date: 04/21/2017 CLINICAL DATA:  Weakness, dizziness and diaphoresis. EXAM: CT HEAD WITHOUT CONTRAST  TECHNIQUE: Contiguous axial images were obtained from the base of the skull through the vertex without intravenous contrast. COMPARISON:  03/08/2017. FINDINGS: Brain: Diffusely enlarged ventricles and subarachnoid spaces. Patchy white matter low density in both cerebral hemispheres. Interval right caudate head and body lacunar infarcts. Stable old right inferior basal ganglia lacunar infarct or prominent perivascular space. No intracranial hemorrhage, mass lesion or CT evidence of acute infarction. Vascular: No hyperdense vessel or unexpected calcification. Skull: Stable right posterior parietal osteoma. Sinuses/Orbits:  Status post bilateral cataract extraction. Unremarkable paranasal sinuses. Other: None. IMPRESSION: 1. No acute abnormality. 2. Interval visualization of old right caudate lacunar infarcts. 3. Stable old right basal ganglia lacunar infarct or prominent perivascular space. 4. Stable mild diffuse cerebral and cerebellar atrophy. 5. Stable mild chronic small vessel white matter ischemic changes in both cerebral hemispheres. Electronically Signed   By: Claudie Revering M.D.   On: 04/21/2017 02:13    ____________________________________________   PROCEDURES  Procedure(s) performed: None  Procedures  Critical Care performed: No  ____________________________________________   INITIAL IMPRESSION / ASSESSMENT AND PLAN / ED COURSE  Pertinent labs & imaging results that were available during my care of the patient were reviewed by me and considered in my medical decision making (see chart for details).  This is a 81 year old who came into the hospital today with some dizziness. He felt like he was passed out. His initial blood sugar was in the 70s but we gave him some fluid and rechecked it and his blood sugar stayed in the 90s. The patient did sleep while in the emergency department he had no complaints. When I did go back in to assess the patient he states that he still did feel dizzy. He  felt like he was choked laying on his back and still felt dizzy. He has a pacemaker so I am unable to send the patient for an MRI of his brain and his creatinine is 2 so I am unable to send him for a CTA. I will admit the patient to the hospitalist service for further evaluation of his dizziness. The hospitalist did ask for orthostatic vital signs.      ____________________________________________   FINAL CLINICAL IMPRESSION(S) / ED DIAGNOSES  Final diagnoses:  Dizziness      NEW MEDICATIONS STARTED DURING THIS VISIT:  New Prescriptions   No medications on file     Note:  This document was prepared using Dragon voice recognition software and may include unintentional dictation errors.    Loney Hering, MD 04/21/17 [phone]

## 2017-04-21 NOTE — H&P (Signed)
[facility] NAME: Marco [name]    MR#:  [id number]  DATE OF BIRTH:  [birth year]  DATE OF ADMISSION:  04/21/2017  PRIMARY CARE PHYSICIAN: [name], MD   REQUESTING/REFERRING PHYSICIAN: Dr. Dahlia Client  CHIEF COMPLAINT:   Chief Complaint  Patient presents with  . Dizziness    HISTORY OF PRESENT ILLNESS:  Dawud Mays  is a 81 y.o. male with a known history of Diabetes, hypertension, congestive heart failure, DVT, CKD stage III, chronic orthostatic hypotension presents to the emergency room complaining of weakness and acute onset of dizziness. Patient's wife has also noticed that he is weaker on the right leg. Patient does not remember when this started. He has had orthostatic hypotension and his blood pressure medications have been stopped over the last 2 admissions. He was started on Florinef, hydrocortisone and discharged to skilled nursing facility. Patient went home on Tuesday. Continues to feel weak. But due to acute onset of dizziness overnight he has presented to the emergency room. Orthostatic vitals positive in the ER. Blood pressure systolic dropped from [phone]. On home oxygen.  PAST MEDICAL HISTORY:   Past Medical History:  Diagnosis Date  . AAA (abdominal aortic aneurysm)repaired    a. 12/2006 s/p repair of 6cm infrarenal AAA with 51mm Dacron graft.  . Anemia   . Anginal pain ([place])   . Atrial fibrillation/Flutter    a. Dx 2014;  b. 10/2012 s/p TEE/DCCV;  c. On amio and Eliquis (CHA2DS2VASc = 6).  . Basal cell carcinoma    a. 2013 x 2 back and left neck  . BPH (benign prostatic hypertrophy)   . Carotid artery occlusion    a. 11/2008 s/p R CEA; b. 01/2017 Carotid U/S: RICA <91%, LICA >47%.  . Chronic systolic heart failure ([place])    a. 02/2012 Echo: EF 30-35%, Pulm HTN, and modest RV dysfunction;  b. 10/2012 TEE: EF 25-30%, mild conc LVH, diff HK, mod RV dysfxn, mod dil RA/LA, mild MR, no MS, nl MVR, mod TR, Ao  sclerosis; c. 01/2017 Echo: EF 45-50%, diff HK, triv MR, mod dil LA.  . CKD (chronic kidney disease), stage IV ([place])   . Coronary artery disease    a. 1999 PCI RCA;  b. 01/2005 NSTEMI: CABG x 3 (LIMA->LAD, VG->OM, VG->LPL) @ time of MVR.  . Diabetes mellitus, insulin dependent (IDDM), controlled ([place])   . DVT (deep venous thrombosis) ([place])   . GERD (gastroesophageal reflux disease)   . Heart disease   . History of hiatal hernia   . History of shingles   . Hyperlipidemia   . Hyperlipidemia, unspecified   . Hypertension   . Hypertensive heart disease   . Mitral regurgitation--s/p repair    a. 01/2005 MVR: 28-mm Edwards ET Logix ring annuloplasty; b. 10/2012 TEE: normal fxning MV ring with mild MR, no MS; 01/2017 Echo: Triv MR.  . Mixed Ischemic and Non-ischemic Cardiomyopathy    a. 02/2012 Echo: EF 30-35%, Pulm HTN, and modest RV dysfunction;  b. 10/2012 TEE: EF 25-30%, mild conc LVH, diff HK, mod RV dysfxn, mod dil RA/LA, mild MR, no MS, nl MVR, mod TR, Ao sclerosis; c. 01/2017 Echo: EF 45-50%.  . Myocardial infarction ([place])    2006  . Neuropathy    SOME  . Obesity   . Onychocryptosis   . Onychomycosis   . Orthopnea   . Orthostatic hypotension    a. 11/2016 -> meds reduced;  b. 01/2017 admitted  w/ presyncope and orthostasis->Meds held.  . Oxygen deficit    a. Wears 2 lpm most of the day and @ HS.  . Paresthesias    in the feet  . Paronychia of third toe, right    contusion with paronychia right third toe  . Presence of permanent cardiac pacemaker   . PVC (premature ventricular contraction)    a. 02/2009 s/p RFCA ([place]).  . Sick sinus syndrome ([place])    a. 01/2005 s/p PPM 2/2 heart block following CABG;  b. 05/2012 Gen Change: MDT Adapta ADDRL1 DC PPM, ser # [id number].  . Sleep apnea    a. Intolerant of CPAP  . Type II diabetes mellitus ([place])     PAST SURGICAL HISTORY:   Past Surgical History:  Procedure Laterality Date  . ABDOMINAL AORTIC ANEURYSM REPAIR      May 2008,   .  CARDIAC CATHETERIZATION  2006   @ [facility]  . CATARACT EXTRACTION     right  . CATARACT EXTRACTION W/PHACO Left 10/04/2016   Procedure: CATARACT EXTRACTION PHACO AND INTRAOCULAR LENS PLACEMENT (IOC);  Surgeon: Estill Cotta, MD;  Location: [facility] ORS;  Service: Ophthalmology;  Laterality: Left;  [place] 01:21 AP% 25.9 CDE 39.08 Fluid pack lot # [id number]   . CORONARY ARTERY BYPASS GRAFT     CABG with a  LIMA to the LAD, SVG to circumflex, SVG to posterolateral [id number].  Randolm Idol / REPLACE / REMOVE PACEMAKER  2006   Dr. [name]  . MITRAL VALVE REPAIR  2006    mitral valve repair with a #28 Edwards logic ring  . PACEMAKER PLACEMENT    . PERMANENT PACEMAKER GENERATOR CHANGE N/A 06/26/2012   Procedure: PERMANENT PACEMAKER GENERATOR CHANGE;  Surgeon: Deboraha Sprang, MD;  Location: [facility] CATH LAB;  Service: Cardiovascular;  Laterality: N/A;  . SKIN BIOPSY     left wrist; right elbow  . SKIN CANCER EXCISION      x 6  . SKIN CANCER EXCISION    . TRANSESOPHAGEAL ECHOCARDIOGRAM    . VASECTOMY      SOCIAL HISTORY:   Social History  Substance Use Topics  . Smoking status: Former Smoker    Packs/day: 1.50    Years: 34.00    Types: Cigarettes    Quit date: 08/28/1987  . Smokeless tobacco: Never Used  . Alcohol use 0.6 oz/week    1 Cans of beer per week     Comment: Occasionally but none since his surgery in June    FAMILY HISTORY:   Family History  Problem Relation Age of Onset  . Heart attack Mother 28  . Diabetes Sister   . Hypertension Sister   . Heart attack Sister   . Heart attack Brother 35       3 Brothers deceased from [place] in 45's or 29's  . Heart disease Brother        Heart Disease before age 82  . Heart attack Father 52  . Diabetes Father        Amputation: bilateral feet  . Heart disease Father   . Heart attack Sister   . Heart attack Sister   . Heart attack Brother 51  . Heart attack Brother 40  . Diabetes Son     DRUG ALLERGIES:   Allergies  Allergen Reactions   . Shellfish Allergy Anaphylaxis    REVIEW OF SYSTEMS:   Review of Systems  Constitutional: Positive for malaise/fatigue. Negative for chills, fever and weight loss.  HENT:  Negative for hearing loss and nosebleeds.   Eyes: Negative for blurred vision, double vision and pain.  Respiratory: Negative for cough, hemoptysis, sputum production, shortness of breath and wheezing.   Cardiovascular: Negative for chest pain, palpitations, orthopnea and leg swelling.  Gastrointestinal: Positive for nausea. Negative for abdominal pain, constipation, diarrhea and vomiting.  Genitourinary: Negative for dysuria and hematuria.  Musculoskeletal: Negative for back pain, falls and myalgias.  Skin: Negative for rash.  Neurological: Positive for dizziness and weakness. Negative for tremors, sensory change, speech change, focal weakness, seizures and headaches.  Endo/Heme/Allergies: Does not bruise/bleed easily.  Psychiatric/Behavioral: Negative for depression and memory loss. The patient is not nervous/anxious.     MEDICATIONS AT HOME:   Prior to Admission medications   Medication Sig Start Date End Date Taking? Authorizing Provider  ACCU-CHEK FASTCLIX LANCETS MISC TEST twice a day if needed 07/04/16   [name], MD  ACCU-CHEK SMARTVIEW test strip TEST twice a day 07/05/16   [name], MD  albuterol (PROVENTIL) (2.5 MG/3ML) 0.083% nebulizer solution Take 3 mLs (2.5 mg total) by nebulization every 6 (six) hours as needed for wheezing or shortness of breath. Patient not taking: Reported on 04/20/2017 02/07/17   Loletha Grayer, MD  Amino Acids-Protein Hydrolys (FEEDING SUPPLEMENT, PRO-STAT SUGAR FREE 64,) LIQD Take 30 mLs by mouth 2 (two) times daily between meals.    [provider]  amiodarone (PACERONE) 200 MG tablet take 1 tablet by mouth once daily 01/08/17   Minna Merritts, MD  bacitracin 500 UNIT/GM ointment Apply 1 application topically daily. Apply to tip of 2nd toe     [provider]  BD INSULIN SYRINGE ULTRAFINE 31G X 5/16" 0.3 ML MISC use twice a day as directed 04/03/16   [name], MD  calcitRIOL (ROCALTROL) 0.5 MCG capsule Take 0.5 mcg by mouth daily.    [provider]  Cholecalciferol (VITAMIN D3) 2000 units capsule Take 2,000 Units by mouth daily.    [provider]  collagenase (SANTYL) ointment Apply 1 application topically daily. Apply a thick layer topically to wound on left chest, cover with ns damp to dry gauze, cover with Allevyn. Change gauze daily. Change Allevyn every 3 days.    [provider]  dextrose (GLUTOSE) 40 % GEL Take 1 Tube by mouth once as needed for low blood sugar. Give 1 tube every 30 minutes orally as needed until BS > 70 and pt is still responsive.    [provider]  docusate sodium (COLACE) 100 MG capsule Take 100 mg by mouth 2 (two) times daily.     [provider]  ELIQUIS 5 MG TABS tablet take 1 tablet by mouth twice a day 07/31/16   [name], MD  finasteride (PROSCAR) 5 MG tablet Take 1 tablet (5 mg total) by mouth daily after supper. 03/20/17   Vaughan Basta, MD  fludrocortisone (FLORINEF) 0.1 MG tablet Take 0.1 mg by mouth daily.    [provider]  glucagon (GLUCAGON EMERGENCY) 1 MG injection Inject 1 mg into the vein once as needed. Give 1 mg for hypoglycemia (symptomatic) not responding to oral glucose, snack. May repeat x 1 after 15 minutes. USE ONLY IF PT IS OR IS BECOMING UNRESPONSIVE.    [provider]  GLUCERNA Barrie Folk) LIQD Take 1 Can by mouth 2 (two) times daily between meals.    [provider]  hydrocortisone (CORTEF) 10 MG tablet Take 10 mg by mouth  2 (two) times daily.    [provider]  insulin lispro protamine-lispro (HUMALOG MIX 75/25) (75-25) 100 UNIT/ML SUSP injection Inject 5 Units into the skin 2 (two) times daily with a meal. Patient taking differently: Inject 5 Units into the  skin 2 (two) times daily with a meal. Taking 10 units 02/07/17   Wieting, Richard, MD  Melatonin 3 MG TABS Take 1 tablet by mouth at bedtime.    [provider]  Multiple Vitamins-Minerals (MULTIVITAL) tablet Take 1 tablet by mouth daily.    [provider]  omeprazole (PRILOSEC) 20 MG capsule Take 20 mg by mouth daily. Monday, Wednesday, friday     [provider]  OXYGEN Inhale 3 L into the lungs continuous.     [provider]  polyethylene glycol (MIRALAX / GLYCOLAX) packet Take 17 g by mouth daily as needed for mild constipation. 06/12/16   Fritzi Mandes, MD  rosuvastatin (CRESTOR) 20 MG tablet take 1 tablet by mouth at bedtime 10/02/16   Minna Merritts, MD  Skin Protectants, Misc. (ENDIT EX) Apply liberal amount topically to gluteal fold wound and surrounding tissue 2 times daily and as needed. Do not scrub skin. If soiled, wipe off top layer and add more cream.    [provider]  triamcinolone (NASACORT ALLERGY 24HR) 55 MCG/ACT AERO nasal inhaler Place 2 sprays into the nose daily.     [provider]  Zinc Oxide (BOUDREAUXS BUTT PASTE) 16 % OINT Apply liberal amount topically to areas of skin irritation 2 times daily and as needed. Ok to leave at bedside.    [provider]  zolpidem (AMBIEN) 5 MG tablet Take 5 mg by mouth at bedtime as needed for sleep.    [provider]     VITAL SIGNS:  Blood pressure (!) 173/109, pulse 79, temperature 97.7 F (36.5 C), temperature source Oral, resp. rate 19, height 6' (1.829 m), weight 110.2 kg (243 lb), SpO2 100 %.  PHYSICAL EXAMINATION:  Physical Exam  GENERAL:  81 y.o.-year-old patient lying in the bed with no acute distress. Obese EYES: Pupils equal, round, reactive to light and accommodation. No scleral icterus. Extraocular muscles intact.  HEENT: Head atraumatic, normocephalic. Oropharynx and nasopharynx clear. No oropharyngeal erythema, moist oral mucosa  NECK:  Supple,  no jugular venous distention. No thyroid enlargement, no tenderness.  LUNGS: Normal breath sounds bilaterally, no wheezing, rales, rhonchi. No use of accessory muscles of respiration.  CARDIOVASCULAR: S1, S2 normal. No murmurs, rubs, or gallops.  ABDOMEN: Soft, nontender, nondistended. Bowel sounds present. No organomegaly or mass.  EXTREMITIES: No pedal edema, cyanosis, or clubbing. + 2 pedal & radial pulses b/l.   NEUROLOGIC: Cranial nerves II through XII are intact. Right lower extremity weakness. Sensations intact all over PSYCHIATRIC: The patient is alert and oriented x 3. Good affect.  SKIN: No obvious rash, lesion, or ulcer.   LABORATORY PANEL:   CBC  Recent Labs Lab 04/21/17 0117  WBC 7.1  HGB 11.2*  HCT 34.3*  PLT 156   ------------------------------------------------------------------------------------------------------------------  Chemistries   Recent Labs Lab 04/21/17 0117  NA 148*  K 3.6  CL 104  CO2 35*  GLUCOSE 83  BUN 36*  CREATININE 2.48*  CALCIUM 9.4   ------------------------------------------------------------------------------------------------------------------  Cardiac Enzymes  Recent Labs Lab 04/21/17 0444  TROPONINI 0.04*   ------------------------------------------------------------------------------------------------------------------  RADIOLOGY:  Ct Head Wo Contrast  Result Date: 04/21/2017 CLINICAL DATA:  Weakness, dizziness and diaphoresis. EXAM: CT HEAD WITHOUT CONTRAST TECHNIQUE: Contiguous axial  images were obtained from the base of the skull through the vertex without intravenous contrast. COMPARISON:  03/08/2017. FINDINGS: Brain: Diffusely enlarged ventricles and subarachnoid spaces. Patchy white matter low density in both cerebral hemispheres. Interval right caudate head and body lacunar infarcts. Stable old right inferior basal ganglia lacunar infarct or prominent perivascular space. No intracranial hemorrhage, mass lesion or CT  evidence of acute infarction. Vascular: No hyperdense vessel or unexpected calcification. Skull: Stable right posterior parietal osteoma. Sinuses/Orbits: Status post bilateral cataract extraction. Unremarkable paranasal sinuses. Other: None. IMPRESSION: 1. No acute abnormality. 2. Interval visualization of old right caudate lacunar infarcts. 3. Stable old right basal ganglia lacunar infarct or prominent perivascular space. 4. Stable mild diffuse cerebral and cerebellar atrophy. 5. Stable mild chronic small vessel white matter ischemic changes in both cerebral hemispheres. Electronically Signed   By: Claudie Revering M.D.   On: 04/21/2017 02:13     IMPRESSION AND PLAN:   * Right lower extremity weakness. Concern regarding acute stroke. CT scan of the head shows nothing acute other than old strokes. Cannot get an MRI due to permanent pacemaker. He does have greater than 70% carotid artery occlusion. Recent echocardiogram reviewed and shows no thrombus. Does have risk factor of atrial fibrillation and is on Eliquis. Discussed with neurology Dr. Irish Elders will see the patient. Cardiac monitoring  * Chronic dizziness with worsening today. Continue to monitor. Orthostatics positive. Bolus IV fluid.  * Insulin dependent diabetes mellitus. Continue home dose. Sliding scale insulin added. Diabetic diet  * Uncontrolled hypertension. Patient's blood pressure medications were stopped due to having worsening orthostatic hypotension.  * Chronic systolic congestive heart failure. Monitor for any fluid overload.  * CKD stage IV. Stable. Monitor input and output.  * DVT prophylaxis. On Eliquis.  All the records are reviewed and case discussed with ED provider. Management plans discussed with the patient, family and they are in agreement.  CODE STATUS: DO NOT RESUSCITATE  TOTAL TIME TAKING CARE OF THIS PATIENT: 40 minutes.   Hillary Bow R M.D on 04/21/2017 at 9:08 AM  Between 7am to 6pm - Pager -  [phone]  After 6pm go to [email or web address] - password [email or web address] [facility]  Office  [phone]  CC: Primary care physician; [name], MD  Note: This dictation was prepared with Dragon dictation along with smaller phrase technology. Any transcriptional errors that result from this process are unintentional.

## 2017-04-21 NOTE — ED Notes (Signed)
Wife left phone # Marco [name] [phone]

## 2017-04-21 NOTE — ED Notes (Signed)
Lab called to report critical troponin of 0.04.  Results given to Dr. Dahlia Client.

## 2017-04-22 ENCOUNTER — Observation Stay: Payer: Medicare Other

## 2017-04-22 DIAGNOSIS — R42 Dizziness and giddiness: Secondary | ICD-10-CM | POA: Diagnosis not present

## 2017-04-22 LAB — CBC
HEMATOCRIT: 33.6 % — AB (ref 40.0–52.0)
HEMOGLOBIN: 10.9 g/dL — AB (ref 13.0–18.0)
MCH: 31.6 pg (ref 26.0–34.0)
MCHC: 32.4 g/dL (ref 32.0–36.0)
MCV: 97.3 fL (ref 80.0–100.0)
PLATELETS: 142 10*3/uL — AB (ref 150–440)
RBC: 3.45 MIL/uL — AB (ref 4.40–5.90)
RDW: 15.7 % — AB (ref 11.5–14.5)
WBC: 5.7 10*3/uL (ref 3.8–10.6)

## 2017-04-22 LAB — BASIC METABOLIC PANEL
ANION GAP: 5 (ref 5–15)
BUN: 36 mg/dL — ABNORMAL HIGH (ref 6–20)
CHLORIDE: 108 mmol/L (ref 101–111)
CO2: 35 mmol/L — AB (ref 22–32)
Calcium: 9.3 mg/dL (ref 8.9–10.3)
Creatinine, Ser: 2.42 mg/dL — ABNORMAL HIGH (ref 0.61–1.24)
GFR calc Af Amer: 28 mL/min — ABNORMAL LOW (ref >60)
GFR calc non Af Amer: 24 mL/min — ABNORMAL LOW (ref >60)
GLUCOSE: 111 mg/dL — AB (ref 65–99)
Potassium: 4.3 mmol/L (ref 3.5–5.1)
Sodium: 148 mmol/L — ABNORMAL HIGH (ref 135–145)

## 2017-04-22 LAB — GLUCOSE, CAPILLARY
GLUCOSE-CAPILLARY: 85 mg/dL (ref 65–99)
GLUCOSE-CAPILLARY: 149 mg/dL — AB (ref 65–99)
Glucose-Capillary: 106 mg/dL — ABNORMAL HIGH (ref 65–99)
Glucose-Capillary: 106 mg/dL — ABNORMAL HIGH (ref 65–99)

## 2017-04-22 MED ORDER — LISINOPRIL 5 MG PO TABS
5.0000 mg | ORAL_TABLET | Freq: Every day | ORAL | Status: DC
  Administered 2017-04-22 – 2017-04-25 (×4): 5 mg via ORAL
  Filled 2017-04-22 (×4): qty 1

## 2017-04-22 NOTE — Consult Note (Signed)
[facility] Nurse wound consult note Reason for Consult: right shoulder partial thickness wound, left buttock full thickness wound (stage 3), left foot, 2nd digit full thickness wound (this wound is followed by podiatry in the community) Wound type: Shear/pressure, neuropathic Pressure Injury POA: Yes Measurement:  Right shoulder wound is partial thickness, measures 0.8cm round x 0.1cm with pink, moist wound bed, scant serous exudate and no odor. Left buttock with evidence of periwound shearing and maceration, full thickness opening measures 0.2cm x 0.6cm x 0.2cm with pink, moist wound bed, scant serous exudate.  Periwound maceration measures 2cm circumferentially and is white, waterlogged tissue. Left foot, second digit with full thickness wound, 1cm round, wound bed covered with stringy yellow slough that obscures depth. Wound bed: As described above Drainage (amount, consistency, odor) As described above Periwound:As described above Dressing procedure/placement/frequency: Patient is taught the importance of turning and repositioning from side to side and of limiting the HOB elevation when able to below 30 degrees.  He has just finished lunch and is currently watching golf on television and wishes his HOB to be at above 30 degrees for this, but agrees to lie down further later.  We will use the silicone foam dressings to the right shoulder and buttocks as they decrease friction and shear, but again, pressure redistribution will be essential to tissue repair and regeneration. The full thickness toe wound will be dressed twice daily with NS and gauze; patient to follow up with podiatry upon discharge for continued oversight of this wound as well as periodic debridement. Pressure redistribution heel boots are provided while in house and in particular while in bed as patient's heels are at risk for pressure injury-particularly if he does not turn and reposition side to side. I leave his heels floated on a pillow  until the boots arrive. [facility] nursing team will not follow, but will remain available to this patient, the nursing and medical teams.  Please re-consult if needed. Thanks, Maudie Flakes, MSN, RN, [place], Arther Abbott  Pager# [phone]

## 2017-04-22 NOTE — Progress Notes (Signed)
[facility] [place] NAME: Marco [name]    MR#:  [id number]  DATE OF BIRTH:  07-Jan-1936  SUBJECTIVE:  CHIEF COMPLAINT:   Chief Complaint  Patient presents with  . Dizziness   Drowzy. Wife at bedside.  REVIEW OF SYSTEMS:    Review of Systems  Constitutional: Positive for malaise/fatigue. Negative for chills and fever.  HENT: Negative for sore throat.   Eyes: Negative for blurred vision, double vision and pain.  Respiratory: Negative for cough, hemoptysis, shortness of breath and wheezing.   Cardiovascular: Negative for chest pain, palpitations, orthopnea and leg swelling.  Gastrointestinal: Negative for abdominal pain, constipation, diarrhea, heartburn, nausea and vomiting.  Genitourinary: Negative for dysuria and hematuria.  Musculoskeletal: Negative for back pain and joint pain.  Skin: Negative for rash.  Neurological: Positive for dizziness, focal weakness and weakness. Negative for sensory change, speech change and headaches.  Endo/Heme/Allergies: Does not bruise/bleed easily.  Psychiatric/Behavioral: Negative for depression. The patient is not nervous/anxious.    DRUG ALLERGIES:   Allergies  Allergen Reactions  . Shellfish Allergy Anaphylaxis    VITALS:  Blood pressure (!) 175/87, pulse 75, temperature 97.7 F (36.5 C), temperature source Oral, resp. rate 18, height 6' (1.829 m), weight 113.9 kg (251 lb 3.2 oz), SpO2 98 %.  PHYSICAL EXAMINATION:   Physical Exam  GENERAL:  81 y.o.-year-old patient lying in the bed with no acute distress. EYES: Pupils equal, round, reactive to light and accommodation. No scleral icterus. Extraocular muscles intact. HEENT: Head atraumatic, normocephalic. Oropharynx and nasopharynx clear. NECK:  Supple, no jugular venous distention. No thyroid enlargement, no tenderness.  LUNGS: Normal breath sounds bilaterally, no wheezing, rales, rhonchi. No use of accessory muscles of respiration.   CARDIOVASCULAR: S1, S2 normal. No murmurs, rubs, or gallops.  ABDOMEN: Soft, nontender, nondistended. Bowel sounds present. No organomegaly or mass. EXTREMITIES: No cyanosis, clubbing or edema b/l.    NEUROLOGIC: Cranial nerves II through XII are intact. No focal Motor or sensory deficits b/l. PSYCHIATRIC: The patient is oriented x 3. Drowzy. SKIN: No obvious rash, lesion, or ulcer.   LABORATORY PANEL:   CBC  Recent Labs Lab 04/22/17 0417  WBC 5.7  HGB 10.9*  HCT 33.6*  PLT 142*   ------------------------------------------------------------------------------------------------------------------ Chemistries   Recent Labs Lab 04/22/17 0417  NA 148*  K 4.3  CL 108  CO2 35*  GLUCOSE 111*  BUN 36*  CREATININE 2.42*  CALCIUM 9.3   ------------------------------------------------------------------------------------------------------------------  Cardiac Enzymes  Recent Labs Lab 04/21/17 0444  TROPONINI 0.04*   ------------------------------------------------------------------------------------------------------------------  RADIOLOGY:  Ct Head Wo Contrast  Result Date: 04/22/2017 CLINICAL DATA:  Continued surveillance of dizziness and RIGHT leg weakness. EXAM: CT HEAD WITHOUT CONTRAST TECHNIQUE: Contiguous axial images were obtained from the base of the skull through the vertex without intravenous contrast. COMPARISON:  04/21/2017. FINDINGS: The patient has contraindications to MRI and CTA head. Repeat CT head is performed. Brain: Generalized atrophy. Chronic microvascular ischemic change. Chronic lacunar infarctions of the periventricular white matter and posterior limb internal capsule on the RIGHT are stable. No interval change in the appearance of the brain since yesterday's scan. No new areas of concern for cortical or subcortical infarction. No hemorrhage, hydrocephalus, or extra-axial fluid. Vascular: Calcification of the cavernous internal carotid arteries consistent  with cerebrovascular atherosclerotic disease. No signs of intracranial large vessel occlusion. Skull: Normal. Negative for fracture or focal lesion. Sinuses/Orbits: No acute finding. Other: None. IMPRESSION: Stable exam. Chronic changes as described. No acute infarction  or hemorrhage is evident. Electronically Signed   By: Staci Righter M.D.   On: 04/22/2017 09:01   Ct Head Wo Contrast  Result Date: 04/21/2017 CLINICAL DATA:  Weakness, dizziness and diaphoresis. EXAM: CT HEAD WITHOUT CONTRAST TECHNIQUE: Contiguous axial images were obtained from the base of the skull through the vertex without intravenous contrast. COMPARISON:  03/08/2017. FINDINGS: Brain: Diffusely enlarged ventricles and subarachnoid spaces. Patchy white matter low density in both cerebral hemispheres. Interval right caudate head and body lacunar infarcts. Stable old right inferior basal ganglia lacunar infarct or prominent perivascular space. No intracranial hemorrhage, mass lesion or CT evidence of acute infarction. Vascular: No hyperdense vessel or unexpected calcification. Skull: Stable right posterior parietal osteoma. Sinuses/Orbits: Status post bilateral cataract extraction. Unremarkable paranasal sinuses. Other: None. IMPRESSION: 1. No acute abnormality. 2. Interval visualization of old right caudate lacunar infarcts. 3. Stable old right basal ganglia lacunar infarct or prominent perivascular space. 4. Stable mild diffuse cerebral and cerebellar atrophy. 5. Stable mild chronic small vessel white matter ischemic changes in both cerebral hemispheres. Electronically Signed   By: Claudie Revering M.D.   On: 04/21/2017 02:13   US Carotid Bilateral  Result Date: 04/21/2017 CLINICAL DATA:  Stroke. EXAM: BILATERAL CAROTID DUPLEX ULTRASOUND TECHNIQUE: Pearline Cables scale imaging, color Doppler and duplex ultrasound were performed of bilateral carotid and vertebral arteries in the neck. COMPARISON:  Angiogram images [phone] FINDINGS: Criteria:  Quantification of carotid stenosis is based on velocity parameters that correlate the residual internal carotid diameter with NASCET-based stenosis levels, using the diameter of the distal internal carotid lumen as the denominator for stenosis measurement. The following velocity measurements were obtained: RIGHT ICA:  228 cm/sec CCA:  96 cm/sec SYSTOLIC ICA/CCA RATIO:  2.4 DIASTOLIC ICA/CCA RATIO:  4.8 ECA:  87 cm/sec LEFT ICA:  370 cm/sec CCA:  366 cm/sec SYSTOLIC ICA/CCA RATIO:  3.4 DIASTOLIC ICA/CCA RATIO:  4.7 ECA:  221 cm/sec RIGHT CAROTID ARTERY: Scattered echogenic plaque in the right common carotid artery. External carotid artery is patent with normal waveform. Small amount of plaque in the right internal carotid artery without significant stenosis based on the color Doppler images. However, the peak systolic velocity is elevated measuring up to 228 cm/sec in the distal internal carotid artery. RIGHT VERTEBRAL ARTERY: Antegrade flow and normal waveform in the right vertebral artery. LEFT CAROTID ARTERY: Scattered echogenic plaque in the left common carotid artery. Echogenic plaque at the left carotid bulb. Calcified plaque with shadowing at the origin of the external carotid artery. Probable stenosis in the proximal external carotid artery based on the elevated peak systolic velocity. Large amount shadowing plaque in the proximal internal carotid artery and limited evaluation of this area. Peak systolic velocity in the proximal internal carotid artery is markedly elevated measuring up to the 341 cm/sec. Mid internal carotid artery velocity is also elevated measuring 370 cm/sec. Distal left internal carotid artery is patent. LEFT VERTEBRAL ARTERY: Antegrade flow and normal waveform in the left vertebral artery. IMPRESSION: Atherosclerotic disease in bilateral carotid arteries, left greater than right. Large amount of calcified plaque in the proximal left internal carotid artery. Estimated degree of stenosis  in the left internal carotid artery is greater than 70% based on the velocities. Mild atherosclerotic disease in the right carotid arteries. The peak systolic velocities in the right internal carotid artery suggest a stenosis between 50-69% (closer to 69%). These velocities are discrepant with the degree of stenosis based on the color Doppler images and grayscale imaging. Concern for severe stenosis in  the proximal left internal carotid artery but limited evaluation due to calcified shadowing plaque in this area. There is also discrepancy between the degree of stenosis in the right internal carotid artery based on velocities and color Doppler imaging. Carotid artery stenosis could be better characterized with MRA or CTA of the neck. Patent vertebral arteries with antegrade flow. These results will be called to the ordering clinician or representative by the Radiologist Assistant, and communication documented in the PACS or zVision Dashboard. Electronically Signed   By: Markus Daft M.D.   On: 04/21/2017 15:54     ASSESSMENT AND PLAN:   * Acute CVA with RLE weakness  CT scan of the head shows nothing acute other than old strokes. Cannot get an MRI due to permanent pacemaker. CTH repeated and no change. He does have greater than 70% carotid artery occlusion. Recent echocardiogram reviewed and shows no thrombus. Does have risk factor of atrial fibrillation and is on Eliquis. Discussed with neurology Dr. Irish Elders . Discussed with Dr. Lorenso Courier and advised cardiac w/u prior to any carotid intervention.  * Chest pain Resolved Consult cardiology  * Chronic dizziness. Continue to monitor. Orthostatics positive. Bolused IV fluid yesterday  * Insulin dependent diabetes mellitus. Continue home dose. Sliding scale insulin added. Diabetic diet  * Uncontrolled hypertension. Patient's blood pressure medications were stopped due to having worsening orthostatic hypotension. Will start low dose lisinopril at 5 mg  daily  * Chronic systolic congestive heart failure. Monitor for any fluid overload.  * CKD stage IV. Stable. Monitor input and output.  * DVT prophylaxis. On Eliquis.  All the records are reviewed and case discussed with Care Management/Social Worker Management plans discussed with the patient, family and they are in agreement.  CODE STATUS: DNR  DVT Prophylaxis: SCDs  TOTAL TIME TAKING CARE OF THIS PATIENT: 35 minutes.   POSSIBLE D/C IN 1-2 DAYS, DEPENDING ON CLINICAL CONDITION.  Hillary Bow R M.D on 04/22/2017 at 6:16 PM  Between 7am to 6pm - Pager - [phone]  After 6pm go to [email or web address] - password [email or web address] [facility]  Office  [phone]  CC: Primary care physician; [name], MD  Note: This dictation was prepared with Dragon dictation along with smaller phrase technology. Any transcriptional errors that result from this process are unintentional.

## 2017-04-22 NOTE — NC FL2 (Signed)
[place] LEVEL OF CARE SCREENING TOOL     IDENTIFICATION  Patient Name: Marco Gudiel Sr. Birthdate: Dec 29, 1935 Sex: male Admission Date (Current Location): 04/21/2017  [place] and [place] Number:  Engineering geologist and Address:  [facility], [address], [place], [place] [address]      Provider Number: [id number]  Attending Physician Name and Address:  Hillary Bow, MD  Relative Name and Phone Number:  Immanuel Fedak (spouse) [phone]    Current Level of Care: Hospital Recommended Level of Care: [place] Prior Approval Number:    Date Approved/Denied: 08/21/11 PASRR Number: [id number]  Discharge Plan: SNF    Current Diagnoses: Patient Active Problem List   Diagnosis Date Noted  . Dizziness 04/21/2017  . Peripheral vascular disease of lower extremity with ulceration ([place]) 04/02/2017  . Skin ulcer of second toe of left foot with fat layer exposed ([place]) 04/02/2017  . Skin ulcer of second toe of right foot ([place]) 04/02/2017  . Non-healing non-surgical wound 04/02/2017  . Hypoalbuminemia due to protein-calorie malnutrition ([place]) 04/02/2017  . Vitamin D deficiency 04/02/2017  . Adrenal insufficiency (Addison's disease) ([place]) 04/02/2017  . Near syncope 02/02/2017  . Pressure injury of skin 02/02/2017  . Orthostasis 12/04/2016  . LGI bleed 06/10/2016  . Acute blood loss anemia 06/10/2016  . Chronic respiratory failure with hypoxia, on home O2 therapy ([place]) 06/10/2016  . PAD (peripheral artery disease) ([place]) 06/05/2016  . Chest pain 10/28/2015  . Gastroenteritis   . Diarrhea of infectious origin   . Emesis   . S/P CABG (coronary artery bypass graft)   . Coronary artery disease involving native coronary artery of native heart without angina pectoris   . PAF (paroxysmal atrial fibrillation) ([place])   . Absolute anemia 01/28/2015  . Atherosclerosis of coronary artery 01/28/2015  . Barrett's  esophagus 01/28/2015  . Basal cell carcinoma of face 01/28/2015  . Benign fibroma of prostate 01/28/2015  . Diabetes ([place]) 01/28/2015  . Gastro-esophageal reflux disease without esophagitis 01/28/2015  . Irritable colon 01/28/2015  . Pleural cavity effusion 01/28/2015  . Apnea, sleep 01/28/2015  . Hematuria 12/21/2014  . Left hand weakness 09/23/2013  . Essential hypertension 06/23/2013  . Chronic combined systolic and diastolic CHF (congestive heart failure) ([place]) 06/10/2013  . Hypotension, postural 06/10/2013  . Hyperkalemia 06/10/2013  . Adjustment of cardiac pacemaker 06/10/2013  . Atrial fibrillation and flutter ([place]) 10/22/2012  . Pacemaker-Medtronic 06/19/2012  . Pulmonary hypertension ([place]) 03/18/2012  . Occlusion and stenosis of carotid artery without mention of cerebral infarction 02/08/2012  . Morbid obesity ([place]) 08/05/2010  . Sinoatrial node dysfunction ([facility]) 06/02/2010  . Stage 4 chronic renal impairment associated with type 2 diabetes mellitus ([place]) 08/09/2009  . AAA 04/21/2009  . Ischemic cardiomyopathy  s/p CABG 11/23/2008  . MITRAL REGURGITATION 11/22/2008  . CAD (coronary artery disease) 11/22/2008  . Hyperlipidemia 03/02/2008  . ALLERGIC RHINITIS 03/02/2008  . OSA (obstructive sleep apnea) 03/02/2008    Orientation RESPIRATION BLADDER Height & Weight     Self, Time, Situation, Place  O2 (2L o2) Continent Weight: 251 lb 3.2 oz (113.9 kg) Height:  6' (182.9 cm)  BEHAVIORAL SYMPTOMS/MOOD NEUROLOGICAL BOWEL NUTRITION STATUS      Continent Diet (Heart healthy, carb modified)  AMBULATORY STATUS COMMUNICATION OF NEEDS Skin   Extensive Assist Verbally PU Stage and Appropriate Care (Silicone foam dressings to the right shoulder and buttocks as they decrease friction and shear; The full thickness toe wound will be  dressed twice daily with NS and gauze; patient to follow up with podiatry upon discharge for continued oversight of this )   PU Stage 2 Dressing: BID                    Personal Care Assistance Level of Assistance  Bathing, Feeding, Dressing Bathing Assistance: Maximum assistance Feeding assistance: Independent Dressing Assistance: Maximum assistance     Functional Limitations Info             SPECIAL CARE FACTORS FREQUENCY  PT (By licensed PT)     PT Frequency: Up to 5X per day, 5 days per week              Contractures Contractures Info: Not present    Additional Factors Info  Code Status, Allergies Code Status Info: DNR Allergies Info: Shellfish allergy           Current Medications (04/22/2017):  This is the current hospital active medication list Current Facility-Administered Medications  Medication Dose Route Frequency Provider Last Rate Last Dose  . 0.9 %  sodium chloride infusion  250 mL Intravenous PRN Sudini, Alveta Heimlich, MD      . acetaminophen (TYLENOL) tablet 650 mg  650 mg Oral Q6H PRN Hillary Bow, MD       Or  . acetaminophen (TYLENOL) suppository 650 mg  650 mg Rectal Q6H PRN Sudini, Srikar, MD      . albuterol (PROVENTIL) (2.5 MG/3ML) 0.083% nebulizer solution 2.5 mg  2.5 mg Nebulization Q2H PRN Sudini, Srikar, MD      . albuterol (PROVENTIL) (2.5 MG/3ML) 0.083% nebulizer solution 2.5 mg  2.5 mg Nebulization Q6H PRN Hillary Bow, MD   2.5 mg at 04/21/17 1229  . amiodarone (PACERONE) tablet 200 mg  200 mg Oral Daily Hillary Bow, MD   200 mg at 04/22/17 0919  . apixaban (ELIQUIS) tablet 5 mg  5 mg Oral BID Hillary Bow, MD   5 mg at 04/22/17 0919  . aspirin EC tablet 81 mg  81 mg Oral Daily Leotis Pain, MD   81 mg at 04/22/17 0919  . calcitRIOL (ROCALTROL) capsule 0.5 mcg  0.5 mcg Oral Q breakfast Hillary Bow, MD   0.5 mcg at 04/22/17 0911  . docusate sodium (COLACE) capsule 100 mg  100 mg Oral BID Hillary Bow, MD   100 mg at 04/22/17 0919  . feeding supplement (PRO-STAT SUGAR FREE 64) liquid 30 mL  30 mL Oral BID BM Sudini, Srikar, MD   30 mL at 04/22/17 1344  . finasteride (PROSCAR)  tablet 5 mg  5 mg Oral PC supper Hillary Bow, MD   5 mg at 04/21/17 1640  . fludrocortisone (FLORINEF) tablet 0.1 mg  0.1 mg Oral Daily Sudini, Srikar, MD   0.1 mg at 04/22/17 1216  . hydrocortisone (CORTEF) tablet 10 mg  10 mg Oral BID Hillary Bow, MD   10 mg at 04/22/17 0919  . insulin aspart (novoLOG) injection 0-9 Units  0-9 Units Subcutaneous TID WC Hillary Bow, MD   1 Units at 04/21/17 1646  . insulin aspart protamine- aspart (NOVOLOG MIX 70/30) injection 5 Units  5 Units Subcutaneous BID WC Hillary Bow, MD   5 Units at 04/22/17 0910  . lisinopril (PRINIVIL,ZESTRIL) tablet 5 mg  5 mg Oral Daily Hillary Bow, MD   5 mg at 04/22/17 1344  . MEDLINE mouth rinse  15 mL Mouth Rinse BID Hillary Bow, MD   15 mL at 04/22/17 0925  .  Melatonin TABS 2.5 mg  2.5 mg Oral Corwin Levins, MD   2.5 mg at 04/21/17 2329  . ondansetron (ZOFRAN) tablet 4 mg  4 mg Oral Q6H PRN Hillary Bow, MD       Or  . ondansetron (ZOFRAN) injection 4 mg  4 mg Intravenous Q6H PRN Sudini, Srikar, MD      . pantoprazole (PROTONIX) EC tablet 40 mg  40 mg Oral QAC breakfast Hillary Bow, MD   40 mg at 04/22/17 0919  . polyethylene glycol (MIRALAX / GLYCOLAX) packet 17 g  17 g Oral Daily PRN Sudini, Alveta Heimlich, MD      . rosuvastatin (CRESTOR) tablet 20 mg  20 mg Oral QHS Hillary Bow, MD   20 mg at 04/21/17 2153  . sodium chloride flush (NS) 0.9 % injection 3 mL  3 mL Intravenous Q12H Sudini, Srikar, MD   3 mL at 04/22/17 0930  . sodium chloride flush (NS) 0.9 % injection 3 mL  3 mL Intravenous PRN Sudini, Alveta Heimlich, MD      . triamcinolone (NASACORT) nasal inhaler 2 spray  2 spray Nasal Daily Hillary Bow, MD   2 spray at 04/22/17 5520     Discharge Medications: Please see discharge summary for a list of discharge medications.  Relevant Imaging Results:  Relevant Lab Results:   Additional Information EY#[id number]  Lezlie Octave [place], LCSW

## 2017-04-22 NOTE — Evaluation (Signed)
Physical Therapy Evaluation Patient Details Name: Marco Giannotti Sr. MRN: [id number] DOB: March 29, 1936 Today's Date: 04/22/2017   History of Present Illness  81 yo M, admitted 7/19 due to near syncopal episode now returns with dizziness/weakness. PMH includes: AAA repair, anemia, anginal pain, A-fib, basal cell carcinoma, BPH, carotid ant occlusion, chronic systolic heart failure, CKD, DVT, GERD, HLD, HTN, heart disease, MI, orthopenia, OH, PVC, sleep apnea, and type II diabetes mellitus.   Clinical Impression  Pt reports that he has been quite limited with mobility, etc for a few months now but is even weaker and more limited now.  He did not seem to have significant different in R vs L LE strength but did have general weakness (grossly 3+/5 t/o).  Overall pt was very unsure of himself with mobility and struggled even getting to the [facility].  Pt agrees that he is not safe to go home and states "If I need to do rehab I guess I'll do rehab."    Follow Up Recommendations SNF    Equipment Recommendations       Recommendations for Other Services       Precautions / Restrictions Precautions Precautions: Fall Restrictions Weight Bearing Restrictions: No      Mobility  Bed Mobility Overal bed mobility: Needs Assistance Bed Mobility: Supine to Sit;Sit to Supine     Supine to sit: Min assist Sit to supine: Mod assist;Min assist   General bed mobility comments: Pt unable to to get himself up to sitting, but needed only PT's hand to hold/pull up on, pt needed more assist to get LEs back into bed to return to supine  Transfers Overall transfer level: Needs assistance Equipment used: Rolling walker (2 wheeled) Transfers: Sit to/from Stand Sit to Stand: Min assist         General transfer comment: Pt unsteady and unconfident with standing.  He showed good effort but ultimately needed assist to get to standing and very close guarding to remain  standing  Ambulation/Gait Ambulation/Gait assistance: Mod assist           General Gait Details: pt did not feel strong/well enough to do any true ambulation.  He struggled with taking a few turning steps to get onto the [facility].  No overt LOBs, but generally unsteady and highly reliant on walker  Stairs            Wheelchair Mobility    Modified Rankin (Stroke Patients Only)       Balance Overall balance assessment: Needs assistance   Sitting balance-Leahy Scale: Fair       Standing balance-Leahy Scale: Poor Standing balance comment: poor confidence and poor ability to weight shift, heavy UE use                             Pertinent Vitals/Pain Pain Assessment: No/denies pain    Home Living Family/patient expects to be discharged to:: Skilled nursing facility Living Arrangements: Spouse/significant other               Additional Comments: Primarily utilizes RW for mobility. Reports using SPC when navigating stairs. Uses 2L supplemental O2 24/7 at home, prior to admission     Prior Function Level of Independence: Needs assistance   Gait / Transfers Assistance Needed: Uses transport chair when going to doctor appointments.  Uses RW majority of the time he is ambualting but uses quad cane at times but he does not feel steady with  the cane.  Limited to ambulating household distances due to fatigue.  He reports one fall in the past 6 months (bruising on RUE, was able to catch himself).    ADL's / Homemaking Assistance Needed: Pt's wife "sets up" the shower before he showers independently.  He requires assist to dry off.  His wife assists with donning socks and occasionally helps with donning pants and shirts.  Wife does the driving, cooking, cleaning.        Hand Dominance        Extremity/Trunk Assessment   Upper Extremity Assessment Upper Extremity Assessment: Generalized weakness    Lower Extremity Assessment Lower Extremity Assessment:  Generalized weakness       Communication   Communication: No difficulties  Cognition Arousal/Alertness: Lethargic Behavior During Therapy: WFL for tasks assessed/performed Overall Cognitive Status: Within Functional Limits for tasks assessed                                        General Comments      Exercises     Assessment/Plan    PT Assessment Patient needs continued PT services  PT Problem List Decreased strength;Decreased range of motion;Decreased activity tolerance;Decreased balance;Decreased mobility;Decreased knowledge of use of DME;Decreased safety awareness       PT Treatment Interventions DME instruction;Gait training;Functional mobility training;Therapeutic activities;Therapeutic exercise;Balance training;Patient/family education    PT Goals (Current goals can be found in the Care Plan section)  Acute Rehab PT Goals Patient Stated Goal: get stronger PT Goal Formulation: With patient Time For Goal Achievement: 05/06/17 Potential to Achieve Goals: Fair    Frequency Min 2X/week   Barriers to discharge        Co-evaluation               AM-PAC PT "6 Clicks" Daily Activity  Outcome Measure Difficulty turning over in bed (including adjusting bedclothes, sheets and blankets)?: A Lot Difficulty moving from lying on back to sitting on the side of the bed? : Unable Difficulty sitting down on and standing up from a chair with arms (e.g., wheelchair, bedside commode, etc,.)?: Unable Help needed moving to and from a bed to chair (including a wheelchair)?: A Lot Help needed walking in hospital room?: Total Help needed climbing 3-5 steps with a railing? : Total 6 Click Score: 8    End of Session Equipment Utilized During Treatment: Gait belt Activity Tolerance: Patient limited by fatigue Patient left: with bed alarm set;with call bell/phone within reach   PT Visit Diagnosis: Muscle weakness (generalized) (M62.81);Difficulty in walking, not  elsewhere classified (R26.2)    Time: 8527-7824 PT Time Calculation (min) (ACUTE ONLY): 16 min   Charges:   PT Evaluation $PT Eval Low Complexity: 1 Low     PT G Codes:   PT G-Codes **NOT FOR INPATIENT CLASS** Functional Assessment Tool Used: AM-PAC 6 Clicks Basic Mobility Functional Limitation: Mobility: Walking and moving around Mobility: Walking and Moving Around Current Status (M3536): At least 80 percent but less than 100 percent impaired, limited or restricted Mobility: Walking and Moving Around Goal Status [phone]): At least 40 percent but less than 60 percent impaired, limited or restricted    Kreg Shropshire, DPT 04/22/2017, 11:18 AM

## 2017-04-22 NOTE — Consult Note (Signed)
Reason for Consult: Carotid Stenosis, Dizziness Referring Physician: Dr. Vivia [name] Sr. is an 81 y.o. male.  HPI:  63yom history of DM, HTN, CKD III and chronic orthostatic hypotension presented with dizziness and generalized weakness. He states he has had the symptoms for quite sometime. However, his wife reportedly noticed right leg weakness over the last week. He has known orthostatic hypotension and CAD. Otherwise the patient denies prior TIA, CVAs in the past. History of Right CEA in 2010. Known left ICA stenosis of 70%- followed by Dr. Delana [name].  Past Medical History:  Diagnosis Date  . AAA (abdominal aortic aneurysm)repaired    a. 12/2006 s/p repair of 6cm infrarenal AAA with 51m Dacron graft.  . Anemia   . Anginal pain ([place]   . Atrial fibrillation/Flutter    a. Dx 2014;  b. 10/2012 s/p TEE/DCCV;  c. On amio and Eliquis (CHA2DS2VASc = 6).  . Basal cell carcinoma    a. 2013 x 2 back and left neck  . BPH (benign prostatic hypertrophy)   . Carotid artery occlusion    a. 11/2008 s/p R CEA; b. 01/2017 Carotid U/S: RICA <<55% LICA >>73%  . Chronic systolic heart failure ([place]    a. 02/2012 Echo: EF 30-35%, Pulm HTN, and modest RV dysfunction;  b. 10/2012 TEE: EF 25-30%, mild conc LVH, diff HK, mod RV dysfxn, mod dil RA/LA, mild MR, no MS, nl MVR, mod TR, Ao sclerosis; c. 01/2017 Echo: EF 45-50%, diff HK, triv MR, mod dil LA.  . CKD (chronic kidney disease), stage IV ([place]   . Coronary artery disease    a. 1999 PCI RCA;  b. 01/2005 NSTEMI: CABG x 3 (LIMA->LAD, VG->OM, VG->LPL) @ time of MVR.  . Diabetes mellitus, insulin dependent (IDDM), controlled ([place]   . DVT (deep venous thrombosis) ([place]   . GERD (gastroesophageal reflux disease)   . Heart disease   . History of hiatal hernia   . History of shingles   . Hyperlipidemia   . Hyperlipidemia, unspecified   . Hypertension   . Hypertensive heart disease   . Mitral regurgitation--s/p repair    a. 01/2005 MVR: 28-mm Edwards  ET Logix ring annuloplasty; b. 10/2012 TEE: normal fxning MV ring with mild MR, no MS; 01/2017 Echo: Triv MR.  . Mixed Ischemic and Non-ischemic Cardiomyopathy    a. 02/2012 Echo: EF 30-35%, Pulm HTN, and modest RV dysfunction;  b. 10/2012 TEE: EF 25-30%, mild conc LVH, diff HK, mod RV dysfxn, mod dil RA/LA, mild MR, no MS, nl MVR, mod TR, Ao sclerosis; c. 01/2017 Echo: EF 45-50%.  . Myocardial infarction ([place]    2006  . Neuropathy    SOME  . Obesity   . Onychocryptosis   . Onychomycosis   . Orthopnea   . Orthostatic hypotension    a. 11/2016 -> meds reduced;  b. 01/2017 admitted w/ presyncope and orthostasis->Meds held.  . Oxygen deficit    a. Wears 2 lpm most of the day and @ HS.  . Paresthesias    in the feet  . Paronychia of third toe, right    contusion with paronychia right third toe  . Presence of permanent cardiac pacemaker   . PVC (premature ventricular contraction)    a. 02/2009 s/p RFCA ([facility] [place]).  . Sick sinus syndrome ([place]    a. 01/2005 s/p PPM 2/2 heart block following CABG;  b. 05/2012 Gen Change: MDT Adapta ADDRL1 DC PPM, ser # [id number].  . Sleep apnea  a. Intolerant of CPAP  . Type II diabetes mellitus ([place])     Past Surgical History:  Procedure Laterality Date  . ABDOMINAL AORTIC ANEURYSM REPAIR      May 2008,   . CARDIAC CATHETERIZATION  2006   @ [facility]  . CATARACT EXTRACTION     right  . CATARACT EXTRACTION W/PHACO Left 10/04/2016   Procedure: CATARACT EXTRACTION PHACO AND INTRAOCULAR LENS PLACEMENT (IOC);  Surgeon: Estill Cotta, MD;  Location: [facility] ORS;  Service: Ophthalmology;  Laterality: Left;  [place] 01:21 AP% 25.9 CDE 39.08 Fluid pack lot # [id number]   . CORONARY ARTERY BYPASS GRAFT     CABG with a  LIMA to the LAD, SVG to circumflex, SVG to posterolateral [id number].  Randolm Idol / REPLACE / REMOVE PACEMAKER  2006   Dr. [name]  . MITRAL VALVE REPAIR  2006    mitral valve repair with a #28 Edwards logic ring  . PACEMAKER PLACEMENT    . PERMANENT  PACEMAKER GENERATOR CHANGE N/A 06/26/2012   Procedure: PERMANENT PACEMAKER GENERATOR CHANGE;  Surgeon: Deboraha Sprang, MD;  Location: [facility] CATH LAB;  Service: Cardiovascular;  Laterality: N/A;  . SKIN BIOPSY     left wrist; right elbow  . SKIN CANCER EXCISION      x 6  . SKIN CANCER EXCISION    . TRANSESOPHAGEAL ECHOCARDIOGRAM    . VASECTOMY      Family History  Problem Relation Age of Onset  . Heart attack Mother 6  . Diabetes Sister   . Hypertension Sister   . Heart attack Sister   . Heart attack Brother 38       3 Brothers deceased from [place] in 17's or 64's  . Heart disease Brother        Heart Disease before age 23  . Heart attack Father 45  . Diabetes Father        Amputation: bilateral feet  . Heart disease Father   . Heart attack Sister   . Heart attack Sister   . Heart attack Brother 48  . Heart attack Brother 61  . Diabetes Son     Social History:  reports that he quit smoking about 29 years ago. His smoking use included Cigarettes. He has a 51.00 pack-year smoking history. He has never used smokeless tobacco. He reports that he drinks about 0.6 oz of alcohol per week . He reports that he does not use drugs.  Allergies:  Allergies  Allergen Reactions  . Shellfish Allergy Anaphylaxis    Medications: I have reviewed the patient's current medications.  Results for orders placed or performed during the hospital encounter of 04/21/17 (from the past 48 hour(s))  Glucose, capillary     Status: None   Collection Time: 04/21/17  1:11 AM  Result Value Ref Range   Glucose-Capillary 78 65 - 99 mg/dL  Basic metabolic panel     Status: Abnormal   Collection Time: 04/21/17  1:17 AM  Result Value Ref Range   Sodium 148 (H) 135 - 145 mmol/L   Potassium 3.6 3.5 - 5.1 mmol/L   Chloride 104 101 - 111 mmol/L   CO2 35 (H) 22 - 32 mmol/L   Glucose, Bld 83 65 - 99 mg/dL   BUN 36 (H) 6 - 20 mg/dL   Creatinine, Ser 2.48 (H) 0.61 - 1.24 mg/dL   Calcium 9.4 8.9 - 10.3 mg/dL   GFR  calc non Af Amer 23 (L) >60 mL/min   GFR calc  Af Amer 27 (L) >60 mL/min    Comment: (NOTE) The eGFR has been calculated using the CKD EPI equation. This calculation has not been validated in all clinical situations. eGFR's persistently <60 mL/min signify possible Chronic Kidney Disease.    Anion gap 9 5 - 15  CBC     Status: Abnormal   Collection Time: 04/21/17  1:17 AM  Result Value Ref Range   WBC 7.1 3.8 - 10.6 K/uL   RBC 3.58 (L) 4.40 - 5.90 MIL/uL   Hemoglobin 11.2 (L) 13.0 - 18.0 g/dL   HCT 34.3 (L) 40.0 - 52.0 %   MCV 95.7 80.0 - 100.0 fL   MCH 31.3 26.0 - 34.0 pg   MCHC 32.7 32.0 - 36.0 g/dL   RDW 15.4 (H) 11.5 - 14.5 %   Platelets 156 150 - 440 K/uL  Troponin I     Status: Abnormal   Collection Time: 04/21/17  1:17 AM  Result Value Ref Range   Troponin I 0.04 (HH) <0.03 ng/mL    Comment: CRITICAL RESULT CALLED TO, READ BACK BY AND VERIFIED WITH MATT MARTIN ON 04/21/17 AT 0204 QSD   Glucose, capillary     Status: None   Collection Time: 04/21/17  2:08 AM  Result Value Ref Range   Glucose-Capillary 81 65 - 99 mg/dL  Troponin I     Status: Abnormal   Collection Time: 04/21/17  4:44 AM  Result Value Ref Range   Troponin I 0.04 (HH) <0.03 ng/mL    Comment: CRITICAL VALUE NOTED. VALUE IS CONSISTENT WITH PREVIOUSLY REPORTED/CALLED VALUE RWW   Glucose, capillary     Status: None   Collection Time: 04/21/17  4:48 AM  Result Value Ref Range   Glucose-Capillary 92 65 - 99 mg/dL  Glucose, capillary     Status: Abnormal   Collection Time: 04/21/17  4:43 PM  Result Value Ref Range   Glucose-Capillary 139 (H) 65 - 99 mg/dL  Urinalysis, Complete w Microscopic     Status: Abnormal   Collection Time: 04/21/17  6:04 PM  Result Value Ref Range   Color, Urine YELLOW (A) YELLOW   APPearance HAZY (A) CLEAR   Specific Gravity, Urine 1.013 1.005 - 1.030   pH 5.0 5.0 - 8.0   Glucose, UA 150 (A) NEGATIVE mg/dL   Hgb urine dipstick LARGE (A) NEGATIVE   Bilirubin Urine NEGATIVE  NEGATIVE   Ketones, ur NEGATIVE NEGATIVE mg/dL   Protein, ur 100 (A) NEGATIVE mg/dL   Nitrite NEGATIVE NEGATIVE   Leukocytes, UA NEGATIVE NEGATIVE   RBC / HPF TOO NUMEROUS TO COUNT 0 - 5 RBC/hpf   WBC, UA 0-5 0 - 5 WBC/hpf   Bacteria, UA NONE SEEN NONE SEEN   Squamous Epithelial / LPF NONE SEEN NONE SEEN  Glucose, capillary     Status: Abnormal   Collection Time: 04/21/17  9:51 PM  Result Value Ref Range   Glucose-Capillary 121 (H) 65 - 99 mg/dL  Basic metabolic panel     Status: Abnormal   Collection Time: 04/22/17  4:17 AM  Result Value Ref Range   Sodium 148 (H) 135 - 145 mmol/L   Potassium 4.3 3.5 - 5.1 mmol/L   Chloride 108 101 - 111 mmol/L   CO2 35 (H) 22 - 32 mmol/L   Glucose, Bld 111 (H) 65 - 99 mg/dL   BUN 36 (H) 6 - 20 mg/dL   Creatinine, Ser 2.42 (H) 0.61 - 1.24 mg/dL   Calcium 9.3 8.9 -  10.3 mg/dL   GFR calc non Af Amer 24 (L) >60 mL/min   GFR calc Af Amer 28 (L) >60 mL/min    Comment: (NOTE) The eGFR has been calculated using the CKD EPI equation. This calculation has not been validated in all clinical situations. eGFR's persistently <60 mL/min signify possible Chronic Kidney Disease.    Anion gap 5 5 - 15  CBC     Status: Abnormal   Collection Time: 04/22/17  4:17 AM  Result Value Ref Range   WBC 5.7 3.8 - 10.6 K/uL   RBC 3.45 (L) 4.40 - 5.90 MIL/uL   Hemoglobin 10.9 (L) 13.0 - 18.0 g/dL   HCT 33.6 (L) 40.0 - 52.0 %   MCV 97.3 80.0 - 100.0 fL   MCH 31.6 26.0 - 34.0 pg   MCHC 32.4 32.0 - 36.0 g/dL   RDW 15.7 (H) 11.5 - 14.5 %   Platelets 142 (L) 150 - 440 K/uL  Glucose, capillary     Status: None   Collection Time: 04/22/17  8:06 AM  Result Value Ref Range   Glucose-Capillary 85 65 - 99 mg/dL    Ct Head Wo Contrast  Result Date: 04/22/2017 CLINICAL DATA:  Continued surveillance of dizziness and RIGHT leg weakness. EXAM: CT HEAD WITHOUT CONTRAST TECHNIQUE: Contiguous axial images were obtained from the base of the skull through the vertex without  intravenous contrast. COMPARISON:  04/21/2017. FINDINGS: The patient has contraindications to MRI and CTA head. Repeat CT head is performed. Brain: Generalized atrophy. Chronic microvascular ischemic change. Chronic lacunar infarctions of the periventricular white matter and posterior limb internal capsule on the RIGHT are stable. No interval change in the appearance of the brain since yesterday's scan. No new areas of concern for cortical or subcortical infarction. No hemorrhage, hydrocephalus, or extra-axial fluid. Vascular: Calcification of the cavernous internal carotid arteries consistent with cerebrovascular atherosclerotic disease. No signs of intracranial large vessel occlusion. Skull: Normal. Negative for fracture or focal lesion. Sinuses/Orbits: No acute finding. Other: None. IMPRESSION: Stable exam. Chronic changes as described. No acute infarction or hemorrhage is evident. Electronically Signed   By: Staci Righter M.D.   On: 04/22/2017 09:01   Ct Head Wo Contrast  Result Date: 04/21/2017 CLINICAL DATA:  Weakness, dizziness and diaphoresis. EXAM: CT HEAD WITHOUT CONTRAST TECHNIQUE: Contiguous axial images were obtained from the base of the skull through the vertex without intravenous contrast. COMPARISON:  03/08/2017. FINDINGS: Brain: Diffusely enlarged ventricles and subarachnoid spaces. Patchy white matter low density in both cerebral hemispheres. Interval right caudate head and body lacunar infarcts. Stable old right inferior basal ganglia lacunar infarct or prominent perivascular space. No intracranial hemorrhage, mass lesion or CT evidence of acute infarction. Vascular: No hyperdense vessel or unexpected calcification. Skull: Stable right posterior parietal osteoma. Sinuses/Orbits: Status post bilateral cataract extraction. Unremarkable paranasal sinuses. Other: None. IMPRESSION: 1. No acute abnormality. 2. Interval visualization of old right caudate lacunar infarcts. 3. Stable old right basal  ganglia lacunar infarct or prominent perivascular space. 4. Stable mild diffuse cerebral and cerebellar atrophy. 5. Stable mild chronic small vessel white matter ischemic changes in both cerebral hemispheres. Electronically Signed   By: Claudie Revering M.D.   On: 04/21/2017 02:13   US Carotid Bilateral  Result Date: 04/21/2017 CLINICAL DATA:  Stroke. EXAM: BILATERAL CAROTID DUPLEX ULTRASOUND TECHNIQUE: Pearline Cables scale imaging, color Doppler and duplex ultrasound were performed of bilateral carotid and vertebral arteries in the neck. COMPARISON:  Angiogram images [id number] FINDINGS: Criteria: Quantification of carotid stenosis  is based on velocity parameters that correlate the residual internal carotid diameter with NASCET-based stenosis levels, using the diameter of the distal internal carotid lumen as the denominator for stenosis measurement. The following velocity measurements were obtained: RIGHT ICA:  228 cm/sec CCA:  96 cm/sec SYSTOLIC ICA/CCA RATIO:  2.4 DIASTOLIC ICA/CCA RATIO:  4.8 ECA:  87 cm/sec LEFT ICA:  370 cm/sec CCA:  150 cm/sec SYSTOLIC ICA/CCA RATIO:  3.4 DIASTOLIC ICA/CCA RATIO:  4.7 ECA:  221 cm/sec RIGHT CAROTID ARTERY: Scattered echogenic plaque in the right common carotid artery. External carotid artery is patent with normal waveform. Small amount of plaque in the right internal carotid artery without significant stenosis based on the color Doppler images. However, the peak systolic velocity is elevated measuring up to 228 cm/sec in the distal internal carotid artery. RIGHT VERTEBRAL ARTERY: Antegrade flow and normal waveform in the right vertebral artery. LEFT CAROTID ARTERY: Scattered echogenic plaque in the left common carotid artery. Echogenic plaque at the left carotid bulb. Calcified plaque with shadowing at the origin of the external carotid artery. Probable stenosis in the proximal external carotid artery based on the elevated peak systolic velocity. Large amount shadowing plaque in the  proximal internal carotid artery and limited evaluation of this area. Peak systolic velocity in the proximal internal carotid artery is markedly elevated measuring up to the 341 cm/sec. Mid internal carotid artery velocity is also elevated measuring 370 cm/sec. Distal left internal carotid artery is patent. LEFT VERTEBRAL ARTERY: Antegrade flow and normal waveform in the left vertebral artery. IMPRESSION: Atherosclerotic disease in bilateral carotid arteries, left greater than right. Large amount of calcified plaque in the proximal left internal carotid artery. Estimated degree of stenosis in the left internal carotid artery is greater than 70% based on the velocities. Mild atherosclerotic disease in the right carotid arteries. The peak systolic velocities in the right internal carotid artery suggest a stenosis between 50-69% (closer to 69%). These velocities are discrepant with the degree of stenosis based on the color Doppler images and grayscale imaging. Concern for severe stenosis in the proximal left internal carotid artery but limited evaluation due to calcified shadowing plaque in this area. There is also discrepancy between the degree of stenosis in the right internal carotid artery based on velocities and color Doppler imaging. Carotid artery stenosis could be better characterized with MRA or CTA of the neck. Patent vertebral arteries with antegrade flow. These results will be called to the ordering clinician or representative by the Radiologist Assistant, and communication documented in the PACS or zVision Dashboard. Electronically Signed   By: Markus Daft M.D.   On: 04/21/2017 15:54    Review of Systems  Constitutional: Positive for malaise/fatigue.  Eyes: Negative for blurred vision and double vision.  Cardiovascular: Positive for chest pain.  Neurological: Positive for dizziness. Negative for focal weakness and loss of consciousness.   Blood pressure (!) 176/81, pulse 76, temperature 97.8 F  (36.6 C), temperature source Oral, resp. rate 17, height 6' (1.829 m), weight 113.9 kg (251 lb 3.2 oz), SpO2 100 %. Physical Exam  Nursing note and vitals reviewed. Constitutional: He is oriented to person, place, and time. He appears well-developed.  Eyes: Pupils are equal, round, and reactive to light.  Neck: Normal range of motion. Neck supple. No JVD present.  Cardiovascular: Normal rate, regular rhythm and intact distal pulses.   No carotid bruit  Respiratory: Breath sounds normal. No stridor.  GI: Soft. He exhibits no distension.  Musculoskeletal: He exhibits no  edema.  Neurological: He is alert and oriented to person, place, and time. No cranial nerve deficit. Coordination normal.  Skin: Skin is warm.    Assessment/Plan: Patient with Duplex evidence of ~70% Left carotid stenosis. Presenting symptoms are not clear for TIA or CVA. The dizziness is likely from hypotension. Low likelihood of vertebral basilar as the Vertbral arterties are patent and antegrade in flow.  Recommend Cardiac workup. Patient expressed that he has had some chest discomfort.  No Vascular Surgery intervention recommend at this juncture.  However, recommend outpatient follow up in the next few weeks in the Vascular Surgery office.  Enslee Bibbins A 04/22/2017, 10:57 AM

## 2017-04-22 NOTE — Consult Note (Signed)
Reason for Consult: R leg weakness  Referring Physician: Dr. Darvin Neighbours  CC: R leg weakness   States dizziness has improved but still has RLE weakness.   Past Medical History:  Diagnosis Date  . AAA (abdominal aortic aneurysm)repaired    a. 12/2006 s/p repair of 6cm infrarenal AAA with 62mm Dacron graft.  . Anemia   . Anginal pain ([place])   . Atrial fibrillation/Flutter    a. Dx 2014;  b. 10/2012 s/p TEE/DCCV;  c. On amio and Eliquis (CHA2DS2VASc = 6).  . Basal cell carcinoma    a. 2013 x 2 back and left neck  . BPH (benign prostatic hypertrophy)   . Carotid artery occlusion    a. 11/2008 s/p R CEA; b. 01/2017 Carotid U/S: RICA <85%, LICA >63%.  . Chronic systolic heart failure ([place])    a. 02/2012 Echo: EF 30-35%, Pulm HTN, and modest RV dysfunction;  b. 10/2012 TEE: EF 25-30%, mild conc LVH, diff HK, mod RV dysfxn, mod dil RA/LA, mild MR, no MS, nl MVR, mod TR, Ao sclerosis; c. 01/2017 Echo: EF 45-50%, diff HK, triv MR, mod dil LA.  . CKD (chronic kidney disease), stage IV ([place])   . Coronary artery disease    a. 1999 PCI RCA;  b. 01/2005 NSTEMI: CABG x 3 (LIMA->LAD, VG->OM, VG->LPL) @ time of MVR.  . Diabetes mellitus, insulin dependent (IDDM), controlled ([place])   . DVT (deep venous thrombosis) ([place])   . GERD (gastroesophageal reflux disease)   . Heart disease   . History of hiatal hernia   . History of shingles   . Hyperlipidemia   . Hyperlipidemia, unspecified   . Hypertension   . Hypertensive heart disease   . Mitral regurgitation--s/p repair    a. 01/2005 MVR: 28-mm Edwards ET Logix ring annuloplasty; b. 10/2012 TEE: normal fxning MV ring with mild MR, no MS; 01/2017 Echo: Triv MR.  . Mixed Ischemic and Non-ischemic Cardiomyopathy    a. 02/2012 Echo: EF 30-35%, Pulm HTN, and modest RV dysfunction;  b. 10/2012 TEE: EF 25-30%, mild conc LVH, diff HK, mod RV dysfxn, mod dil RA/LA, mild MR, no MS, nl MVR, mod TR, Ao sclerosis; c. 01/2017 Echo: EF 45-50%.  . Myocardial infarction ([place])    2006  .  Neuropathy    SOME  . Obesity   . Onychocryptosis   . Onychomycosis   . Orthopnea   . Orthostatic hypotension    a. 11/2016 -> meds reduced;  b. 01/2017 admitted w/ presyncope and orthostasis->Meds held.  . Oxygen deficit    a. Wears 2 lpm most of the day and @ HS.  . Paresthesias    in the feet  . Paronychia of third toe, right    contusion with paronychia right third toe  . Presence of permanent cardiac pacemaker   . PVC (premature ventricular contraction)    a. 02/2009 s/p RFCA ([place]).  . Sick sinus syndrome ([place])    a. 01/2005 s/p PPM 2/2 heart block following CABG;  b. 05/2012 Gen Change: MDT Adapta ADDRL1 DC PPM, ser # [id number].  . Sleep apnea    a. Intolerant of CPAP  . Type II diabetes mellitus ([place])     Past Surgical History:  Procedure Laterality Date  . ABDOMINAL AORTIC ANEURYSM REPAIR      May 2008,   . CARDIAC CATHETERIZATION  2006   @ [facility]  . CATARACT EXTRACTION     right  . CATARACT EXTRACTION W/PHACO Left 10/04/2016  Procedure: CATARACT EXTRACTION PHACO AND INTRAOCULAR LENS PLACEMENT (IOC);  Surgeon: Estill Cotta, MD;  Location: [facility] ORS;  Service: Ophthalmology;  Laterality: Left;  [place] 01:21 AP% 25.9 CDE 39.08 Fluid pack lot # [id number]   . CORONARY ARTERY BYPASS GRAFT     CABG with a  LIMA to the LAD, SVG to circumflex, SVG to posterolateral [id number].  Randolm Idol / REPLACE / REMOVE PACEMAKER  2006   Dr. [name]  . MITRAL VALVE REPAIR  2006    mitral valve repair with a #28 Edwards logic ring  . PACEMAKER PLACEMENT    . PERMANENT PACEMAKER GENERATOR CHANGE N/A 06/26/2012   Procedure: PERMANENT PACEMAKER GENERATOR CHANGE;  Surgeon: Deboraha Sprang, MD;  Location: [facility] C[facility] LAB;  Service: Cardiovascular;  Laterality: N/A;  . SKIN BIOPSY     left wrist; right elbow  . SKIN CANCER EXCISION      x 6  . SKIN CANCER EXCISION    . TRANSESOPHAGEAL ECHOCARDIOGRAM    . VASECTOMY      Family History  Problem Relation Age of Onset  . Heart attack Mother 45   . Diabetes Sister   . Hypertension Sister   . Heart attack Sister   . Heart attack Brother 54       3 Brothers deceased from [place] in 75's or 66's  . Heart disease Brother        Heart Disease before age 15  . Heart attack Father 61  . Diabetes Father        Amputation: bilateral feet  . Heart disease Father   . Heart attack Sister   . Heart attack Sister   . Heart attack Brother 57  . Heart attack Brother 84  . Diabetes Son     Social History:  reports that he quit smoking about 29 years ago. His smoking use included Cigarettes. He has a 51.00 pack-year smoking history. He has never used smokeless tobacco. He reports that he drinks about 0.6 oz of alcohol per week . He reports that he does not use drugs.  Allergies  Allergen Reactions  . Shellfish Allergy Anaphylaxis    Medications: I have reviewed the patient's current medications.  ROS: History obtained from the patient  General ROS: negative for - chills, fatigue, fever, night sweats, weight gain or weight loss Psychological ROS: negative for - behavioral disorder, hallucinations, memory difficulties, mood swings or suicidal ideation Ophthalmic ROS: negative for - blurry vision, double vision, eye pain or loss of vision ENT ROS: negative for - epistaxis, nasal discharge, oral lesions, sore throat, tinnitus or vertigo Allergy and Immunology ROS: negative for - hives or itchy/watery eyes Hematological and Lymphatic ROS: negative for - bleeding problems, bruising or swollen lymph nodes Endocrine ROS: negative for - galactorrhea, hair pattern changes, polydipsia/polyuria or temperature intolerance Respiratory ROS: negative for - cough, hemoptysis, shortness of breath or wheezing Cardiovascular ROS: negative for - chest pain, dyspnea on exertion, edema or irregular heartbeat Gastrointestinal ROS: negative for - abdominal pain, diarrhea, hematemesis, nausea/vomiting or stool incontinence Genito-Urinary ROS: negative for -  dysuria, hematuria, incontinence or urinary frequency/urgency Musculoskeletal ROS: negative for - joint swelling or muscular weakness Neurological ROS: as noted in HPI Dermatological ROS: negative for rash and skin lesion changes  Physical Examination: Blood pressure (!) 176/81, pulse 76, temperature 97.8 F (36.6 C), temperature source Oral, resp. rate 17, height 6' (1.829 m), weight 113.9 kg (251 lb 3.2 oz), SpO2 100 %.  Neurological Examination   Mental Status:  Alert, oriented, thought content appropriate.  Speech fluent without evidence of aphasia.  Able to follow 3 step commands without difficulty. Cranial Nerves: II: Discs flat bilaterally; Visual fields grossly normal, pupils equal, round, reactive to light and accommodation III,IV, VI: ptosis not present, extra-ocular motions intact bilaterally V,VII: smile symmetric, facial light touch sensation normal bilaterally VIII: hearing normal bilaterally IX,X: gag reflex present XI: bilateral shoulder shrug XII: midline tongue extension Motor: Right : Upper extremity   4+/5    Left:     Upper extremity   4+/5  Lower extremity   4+/5     Lower extremity   4+/5 Tone and bulk:normal tone throughout; no atrophy noted Sensory: Pinprick and light touch intact throughout, bilaterally Deep Tendon Reflexes: 2+ and symmetric throughout Plantars: Right: downgoing   Left: downgoing Cerebellar: normal finger-to-nose, normal rapid alternating movements and normal heel-to-shin test Gait: not tested       Laboratory Studies:   Basic Metabolic Panel:  Recent Labs Lab 04/21/17 0117 04/22/17 0417  NA 148* 148*  K 3.6 4.3  CL 104 108  CO2 35* 35*  GLUCOSE 83 111*  BUN 36* 36*  CREATININE 2.48* 2.42*  CALCIUM 9.4 9.3    Liver Function Tests: No results for input(s): AST, ALT, ALKPHOS, BILITOT, PROT, ALBUMIN in the last 168 hours. No results for input(s): LIPASE, AMYLASE in the last 168 hours. No results for input(s): AMMONIA in the  last 168 hours.  CBC:  Recent Labs Lab 04/21/17 0117 04/22/17 0417  WBC 7.1 5.7  HGB 11.2* 10.9*  HCT 34.3* 33.6*  MCV 95.7 97.3  PLT 156 142*    Cardiac Enzymes:  Recent Labs Lab 04/21/17 0117 04/21/17 0444  TROPONINI 0.04* 0.04*    BNP: Invalid input(s): POCBNP  CBG:  Recent Labs Lab 04/21/17 0208 04/21/17 0448 04/21/17 1643 04/21/17 2151 04/22/17 0806  GLUCAP 81 92 139* 121* 24    Microbiology: Results for orders placed or performed during the hospital encounter of 03/29/17  Urine Culture     Status: Abnormal   Collection Time: 03/29/17  4:00 PM  Result Value Ref Range Status   Specimen Description URINE, RANDOM  Final   Special Requests NONE  Final   Culture (A)  Final    <10,000 COLONIES/mL INSIGNIFICANT GROWTH Performed at [facility], [place] [address], [place], [place] [address]    Report Status 03/31/2017 FINAL  Final    Coagulation Studies: No results for input(s): LABPROT, INR in the last 72 hours.  Urinalysis:   Recent Labs Lab 04/21/17 1804  COLORURINE YELLOW*  LABSPEC 1.013  PHURINE 5.0  GLUCOSEU 150*  HGBUR LARGE*  BILIRUBINUR NEGATIVE  KETONESUR NEGATIVE  PROTEINUR 100*  NITRITE NEGATIVE  LEUKOCYTESUR NEGATIVE    Lipid Panel:     Component Value Date/Time   CHOL 114 02/02/2017 0751   CHOL 141 06/02/2016 0839   TRIG 56 02/02/2017 0751   TRIG 94 11/04/2008 0000   HDL 37 (L) 02/02/2017 0751   HDL 42 06/02/2016 0839   CHOLHDL 3.1 02/02/2017 0751   VLDL 11 02/02/2017 0751   LDLCALC 66 02/02/2017 0751   LDLCALC 78 06/02/2016 0839    HgbA1C:  Lab Results  Component Value Date   HGBA1C 6.1 (H) 03/16/2017    Urine Drug Screen:  No results found for: LABOPIA, COCAINSCRNUR, LABBENZ, AMPHETMU, THCU, LABBARB  Alcohol Level: No results for input(s): ETH in the last 168 hours.  Other results: EKG: normal EKG, normal sinus rhythm, unchanged  from previous tracings.  Imaging: Ct Head Wo Contrast  Result Date:  04/22/2017 CLINICAL DATA:  Continued surveillance of dizziness and RIGHT leg weakness. EXAM: CT HEAD WITHOUT CONTRAST TECHNIQUE: Contiguous axial images were obtained from the base of the skull through the vertex without intravenous contrast. COMPARISON:  04/21/2017. FINDINGS: The patient has contraindications to MRI and CTA head. Repeat CT head is performed. Brain: Generalized atrophy. Chronic microvascular ischemic change. Chronic lacunar infarctions of the periventricular white matter and posterior limb internal capsule on the RIGHT are stable. No interval change in the appearance of the brain since yesterday's scan. No new areas of concern for cortical or subcortical infarction. No hemorrhage, hydrocephalus, or extra-axial fluid. Vascular: Calcification of the cavernous internal carotid arteries consistent with cerebrovascular atherosclerotic disease. No signs of intracranial large vessel occlusion. Skull: Normal. Negative for fracture or focal lesion. Sinuses/Orbits: No acute finding. Other: None. IMPRESSION: Stable exam. Chronic changes as described. No acute infarction or hemorrhage is evident. Electronically Signed   By: Staci Righter M.D.   On: 04/22/2017 09:01   Ct Head Wo Contrast  Result Date: 04/21/2017 CLINICAL DATA:  Weakness, dizziness and diaphoresis. EXAM: CT HEAD WITHOUT CONTRAST TECHNIQUE: Contiguous axial images were obtained from the base of the skull through the vertex without intravenous contrast. COMPARISON:  03/08/2017. FINDINGS: Brain: Diffusely enlarged ventricles and subarachnoid spaces. Patchy white matter low density in both cerebral hemispheres. Interval right caudate head and body lacunar infarcts. Stable old right inferior basal ganglia lacunar infarct or prominent perivascular space. No intracranial hemorrhage, mass lesion or CT evidence of acute infarction. Vascular: No hyperdense vessel or unexpected calcification. Skull: Stable right posterior parietal osteoma.  Sinuses/Orbits: Status post bilateral cataract extraction. Unremarkable paranasal sinuses. Other: None. IMPRESSION: 1. No acute abnormality. 2. Interval visualization of old right caudate lacunar infarcts. 3. Stable old right basal ganglia lacunar infarct or prominent perivascular space. 4. Stable mild diffuse cerebral and cerebellar atrophy. 5. Stable mild chronic small vessel white matter ischemic changes in both cerebral hemispheres. Electronically Signed   By: Claudie Revering M.D.   On: 04/21/2017 02:13   US Carotid Bilateral  Result Date: 04/21/2017 CLINICAL DATA:  Stroke. EXAM: BILATERAL CAROTID DUPLEX ULTRASOUND TECHNIQUE: Pearline Cables scale imaging, color Doppler and duplex ultrasound were performed of bilateral carotid and vertebral arteries in the neck. COMPARISON:  Angiogram images [phone] FINDINGS: Criteria: Quantification of carotid stenosis is based on velocity parameters that correlate the residual internal carotid diameter with NASCET-based stenosis levels, using the diameter of the distal internal carotid lumen as the denominator for stenosis measurement. The following velocity measurements were obtained: RIGHT ICA:  228 cm/sec CCA:  96 cm/sec SYSTOLIC ICA/CCA RATIO:  2.4 DIASTOLIC ICA/CCA RATIO:  4.8 ECA:  87 cm/sec LEFT ICA:  370 cm/sec CCA:  893 cm/sec SYSTOLIC ICA/CCA RATIO:  3.4 DIASTOLIC ICA/CCA RATIO:  4.7 ECA:  221 cm/sec RIGHT CAROTID ARTERY: Scattered echogenic plaque in the right common carotid artery. External carotid artery is patent with normal waveform. Small amount of plaque in the right internal carotid artery without significant stenosis based on the color Doppler images. However, the peak systolic velocity is elevated measuring up to 228 cm/sec in the distal internal carotid artery. RIGHT VERTEBRAL ARTERY: Antegrade flow and normal waveform in the right vertebral artery. LEFT CAROTID ARTERY: Scattered echogenic plaque in the left common carotid artery. Echogenic plaque at the left carotid  bulb. Calcified plaque with shadowing at the origin of the external carotid artery. Probable stenosis in the proximal external carotid  artery based on the elevated peak systolic velocity. Large amount shadowing plaque in the proximal internal carotid artery and limited evaluation of this area. Peak systolic velocity in the proximal internal carotid artery is markedly elevated measuring up to the 341 cm/sec. Mid internal carotid artery velocity is also elevated measuring 370 cm/sec. Distal left internal carotid artery is patent. LEFT VERTEBRAL ARTERY: Antegrade flow and normal waveform in the left vertebral artery. IMPRESSION: Atherosclerotic disease in bilateral carotid arteries, left greater than right. Large amount of calcified plaque in the proximal left internal carotid artery. Estimated degree of stenosis in the left internal carotid artery is greater than 70% based on the velocities. Mild atherosclerotic disease in the right carotid arteries. The peak systolic velocities in the right internal carotid artery suggest a stenosis between 50-69% (closer to 69%). These velocities are discrepant with the degree of stenosis based on the color Doppler images and grayscale imaging. Concern for severe stenosis in the proximal left internal carotid artery but limited evaluation due to calcified shadowing plaque in this area. There is also discrepancy between the degree of stenosis in the right internal carotid artery based on velocities and color Doppler imaging. Carotid artery stenosis could be better characterized with MRA or CTA of the neck. Patent vertebral arteries with antegrade flow. These results will be called to the ordering clinician or representative by the Radiologist Assistant, and communication documented in the PACS or zVision Dashboard. Electronically Signed   By: Markus Daft M.D.   On: 04/21/2017 15:54     Assessment/Plan:   81 y.o. male known history of Diabetes, hypertension, congestive heart  failure, DVT, CKD stage III, chronic orthostatic hypotension presents to the emergency room complaining of weakness and acute onset of dizziness. Dizziness described as positional occurring when turning or sitting up. Patient's wife has also noticed that he is weaker on the right leg that has been present since Tuesday last week.. Patient does not remember when this started. He has had orthostatic hypotension and his blood pressure medications have been stopped over the last 2 admissions.No anti platelet therapy at home.  Pt has PPM  Repeat CTH no acute abnormalities but pt has significant stenosis L ICA which could have showered and cause small stroke  Pt is on ASA now Out pt vascular follow up Pt/ot Call with questions.       04/22/2017, 11:38 AM

## 2017-04-22 NOTE — Plan of Care (Signed)
Problem: Safety: Goal: Ability to remain free from injury will improve Outcome: Progressing Pt is high fall risk. Pt has yellow arm band and socks on. Bed alarm is on.

## 2017-04-23 DIAGNOSIS — I639 Cerebral infarction, unspecified: Secondary | ICD-10-CM | POA: Diagnosis not present

## 2017-04-23 DIAGNOSIS — I272 Pulmonary hypertension, unspecified: Secondary | ICD-10-CM | POA: Diagnosis present

## 2017-04-23 DIAGNOSIS — R42 Dizziness and giddiness: Secondary | ICD-10-CM | POA: Diagnosis present

## 2017-04-23 DIAGNOSIS — I6523 Occlusion and stenosis of bilateral carotid arteries: Secondary | ICD-10-CM | POA: Diagnosis not present

## 2017-04-23 DIAGNOSIS — I25118 Atherosclerotic heart disease of native coronary artery with other forms of angina pectoris: Secondary | ICD-10-CM | POA: Diagnosis not present

## 2017-04-23 DIAGNOSIS — R0789 Other chest pain: Secondary | ICD-10-CM | POA: Diagnosis not present

## 2017-04-23 DIAGNOSIS — I5043 Acute on chronic combined systolic (congestive) and diastolic (congestive) heart failure: Secondary | ICD-10-CM | POA: Diagnosis not present

## 2017-04-23 DIAGNOSIS — I208 Other forms of angina pectoris: Secondary | ICD-10-CM | POA: Diagnosis not present

## 2017-04-23 DIAGNOSIS — Z01810 Encounter for preprocedural cardiovascular examination: Secondary | ICD-10-CM | POA: Diagnosis not present

## 2017-04-23 DIAGNOSIS — Z9981 Dependence on supplemental oxygen: Secondary | ICD-10-CM | POA: Diagnosis not present

## 2017-04-23 DIAGNOSIS — R296 Repeated falls: Secondary | ICD-10-CM | POA: Diagnosis not present

## 2017-04-23 DIAGNOSIS — R079 Chest pain, unspecified: Secondary | ICD-10-CM | POA: Diagnosis not present

## 2017-04-23 DIAGNOSIS — Z86718 Personal history of other venous thrombosis and embolism: Secondary | ICD-10-CM | POA: Diagnosis not present

## 2017-04-23 DIAGNOSIS — I5042 Chronic combined systolic (congestive) and diastolic (congestive) heart failure: Secondary | ICD-10-CM | POA: Diagnosis present

## 2017-04-23 DIAGNOSIS — E1122 Type 2 diabetes mellitus with diabetic chronic kidney disease: Secondary | ICD-10-CM | POA: Diagnosis present

## 2017-04-23 DIAGNOSIS — I951 Orthostatic hypotension: Secondary | ICD-10-CM | POA: Diagnosis present

## 2017-04-23 DIAGNOSIS — Z85828 Personal history of other malignant neoplasm of skin: Secondary | ICD-10-CM | POA: Diagnosis not present

## 2017-04-23 DIAGNOSIS — Z8679 Personal history of other diseases of the circulatory system: Secondary | ICD-10-CM | POA: Diagnosis not present

## 2017-04-23 DIAGNOSIS — Z950 Presence of cardiac pacemaker: Secondary | ICD-10-CM | POA: Diagnosis not present

## 2017-04-23 DIAGNOSIS — I2510 Atherosclerotic heart disease of native coronary artery without angina pectoris: Secondary | ICD-10-CM | POA: Diagnosis present

## 2017-04-23 DIAGNOSIS — J9611 Chronic respiratory failure with hypoxia: Secondary | ICD-10-CM | POA: Diagnosis present

## 2017-04-23 DIAGNOSIS — Z951 Presence of aortocoronary bypass graft: Secondary | ICD-10-CM | POA: Diagnosis not present

## 2017-04-23 DIAGNOSIS — K219 Gastro-esophageal reflux disease without esophagitis: Secondary | ICD-10-CM | POA: Diagnosis present

## 2017-04-23 DIAGNOSIS — Z7189 Other specified counseling: Secondary | ICD-10-CM | POA: Diagnosis not present

## 2017-04-23 DIAGNOSIS — E662 Morbid (severe) obesity with alveolar hypoventilation: Secondary | ICD-10-CM | POA: Diagnosis present

## 2017-04-23 DIAGNOSIS — Z515 Encounter for palliative care: Secondary | ICD-10-CM | POA: Diagnosis not present

## 2017-04-23 DIAGNOSIS — I130 Hypertensive heart and chronic kidney disease with heart failure and stage 1 through stage 4 chronic kidney disease, or unspecified chronic kidney disease: Secondary | ICD-10-CM | POA: Diagnosis present

## 2017-04-23 DIAGNOSIS — E785 Hyperlipidemia, unspecified: Secondary | ICD-10-CM | POA: Diagnosis present

## 2017-04-23 DIAGNOSIS — I252 Old myocardial infarction: Secondary | ICD-10-CM | POA: Diagnosis not present

## 2017-04-23 DIAGNOSIS — I480 Paroxysmal atrial fibrillation: Secondary | ICD-10-CM | POA: Diagnosis present

## 2017-04-23 DIAGNOSIS — N184 Chronic kidney disease, stage 4 (severe): Secondary | ICD-10-CM | POA: Diagnosis present

## 2017-04-23 DIAGNOSIS — Z833 Family history of diabetes mellitus: Secondary | ICD-10-CM | POA: Diagnosis not present

## 2017-04-23 DIAGNOSIS — E1151 Type 2 diabetes mellitus with diabetic peripheral angiopathy without gangrene: Secondary | ICD-10-CM | POA: Diagnosis present

## 2017-04-23 DIAGNOSIS — N400 Enlarged prostate without lower urinary tract symptoms: Secondary | ICD-10-CM | POA: Diagnosis present

## 2017-04-23 DIAGNOSIS — I4892 Unspecified atrial flutter: Secondary | ICD-10-CM | POA: Diagnosis present

## 2017-04-23 DIAGNOSIS — I63233 Cerebral infarction due to unspecified occlusion or stenosis of bilateral carotid arteries: Secondary | ICD-10-CM | POA: Diagnosis present

## 2017-04-23 LAB — BASIC METABOLIC PANEL
ANION GAP: 5 (ref 5–15)
BUN: 43 mg/dL — ABNORMAL HIGH (ref 6–20)
CALCIUM: 9.1 mg/dL (ref 8.9–10.3)
CO2: 34 mmol/L — ABNORMAL HIGH (ref 22–32)
Chloride: 105 mmol/L (ref 101–111)
Creatinine, Ser: 2.36 mg/dL — ABNORMAL HIGH (ref 0.61–1.24)
GFR, EST AFRICAN AMERICAN: 29 mL/min — AB (ref >60)
GFR, EST NON AFRICAN AMERICAN: 25 mL/min — AB (ref >60)
Glucose, Bld: 126 mg/dL — ABNORMAL HIGH (ref 65–99)
POTASSIUM: 4.2 mmol/L (ref 3.5–5.1)
Sodium: 144 mmol/L (ref 135–145)

## 2017-04-23 LAB — TROPONIN I: TROPONIN I: 0.03 ng/mL — AB (ref <0.03)

## 2017-04-23 LAB — GLUCOSE, CAPILLARY
GLUCOSE-CAPILLARY: 107 mg/dL — AB (ref 65–99)
GLUCOSE-CAPILLARY: 131 mg/dL — AB (ref 65–99)
GLUCOSE-CAPILLARY: 140 mg/dL — AB (ref 65–99)
GLUCOSE-CAPILLARY: 131 mg/dL — AB (ref 65–99)

## 2017-04-23 LAB — SURGICAL PCR SCREEN
MRSA, PCR: NEGATIVE
STAPHYLOCOCCUS AUREUS: NEGATIVE

## 2017-04-23 MED ORDER — LISINOPRIL 5 MG PO TABS: 5.0000 mg | ORAL_TABLET | Freq: Every day | ORAL | 0 refills | Status: AC

## 2017-04-23 MED ORDER — DIPHENHYDRAMINE HCL 25 MG PO CAPS
25.0000 mg | ORAL_CAPSULE | Freq: Every evening | ORAL | Status: DC | PRN
  Administered 2017-04-23 – 2017-04-24 (×2): 25 mg via ORAL
  Filled 2017-04-23 (×3): qty 1

## 2017-04-23 NOTE — Progress Notes (Signed)
[facility]  Daily Progress Note   Subjective  - * No surgery date entered *  Patient remains dizzy and weak. Family has left but is adamant we fix his carotid while he is here Have only an [place] as can not get CTA or MR with renal issues and pacer [place] suggests high grade left carotid stenosis and moderate right (s/p CEA on right)  Objective Vitals:   04/22/17 2029 04/23/17 0347 04/23/17 0500 04/23/17 0851  BP: 140/70 (!) 146/72    Pulse: 74 72    Resp: 20 (!) 24  18  Temp: 98 F (36.7 C) 97.6 F (36.4 C)    TempSrc: Oral Oral    SpO2: 94% 96%  96%  Weight:   114.6 kg (252 lb 9.6 oz)   Height:        Intake/Output Summary (Last 24 hours) at 04/23/17 1457 Last data filed at 04/23/17 0981  Gross per 24 hour  Intake              360 ml  Output                0 ml  Net              360 ml    PULM  CTAB CV  RRR VASC  Bilateral carotid bruits  Laboratory CBC    Component Value Date/Time   WBC 5.7 04/22/2017 0417   HGB 10.9 (L) 04/22/2017 0417   HGB 11.7 (L) 06/26/2016 1159   HCT 33.6 (L) 04/22/2017 0417   HCT 35.1 (L) 06/26/2016 1159   PLT 142 (L) 04/22/2017 0417   PLT 182 06/26/2016 1159    BMET    Component Value Date/Time   NA 144 04/23/2017 0354   NA 144 03/06/2017 1141   NA 138 03/15/2014 0514   K 4.2 04/23/2017 0354   K 3.9 03/15/2014 0514   CL 105 04/23/2017 0354   CL 99 03/15/2014 0514   CO2 34 (H) 04/23/2017 0354   CO2 36 (H) 03/15/2014 0514   GLUCOSE 126 (H) 04/23/2017 0354   GLUCOSE 130 (H) 03/15/2014 0514   BUN 43 (H) 04/23/2017 0354   BUN 34 (H) 03/06/2017 1141   BUN 42 (H) 03/15/2014 0514   CREATININE 2.36 (H) 04/23/2017 0354   CREATININE 2.68 (H) 03/15/2014 0514   CALCIUM 9.1 04/23/2017 0354   CALCIUM 8.6 03/15/2014 0514   CALCIUM 9.0 01/28/2008 0850   GFRNONAA 25 (L) 04/23/2017 0354   GFRNONAA 22 (L) 03/15/2014 0514   GFRAA 29 (L) 04/23/2017 0354   GFRAA 26 (L) 03/15/2014 0514     Assessment/Planning:    Symptomatic carotid stenosis with stroke over a week ago.  Would do catheter based angiogram with limited contrast to evaluate carotids prior to surgery to further evaluate.  Carotid US at this hospital not accurate enough to operate on alone  This would also provide information on best means to repair (stent versus endarterectomy) especially given his high risk status he may not be an appropriate operative candidate.  [place] can not provide this information  Patient understands and desires to proceed  Angiogram tomorrow afternoon  Will need stress test to assess preoperative risk for surgery as well.    Leotis Pain  04/23/2017, 2:57 PM

## 2017-04-23 NOTE — Consult Note (Signed)
Cardiology Consultation Note  Patient ID: Marco [name]., MRN: [id number], DOB/AGE: 79/28/1937 79 y.o. Admit date: 04/21/2017   Date of Consult: 04/23/2017 Primary Physician: [name], MD Primary Cardiologist: Dr. Rockey Situ, MD Requesting Physician: Dr. Darvin Neighbours, MD  Chief Complaint: Dizziness Reason for Consult: Chest pain  HPI: Marco Kelm Sr. is a 81 y.o. male who is being seen today for the evaluation of chest pain at the request of Dr. Darvin Neighbours, MD. Patient has a h/o CAD s/p 3-vessel CABG (LIMA-LAD, SVG-LCx, SVG-PL) in 01/2005, MVR with Edwards #28 logic ring, chronic combined systolic and diastolic CHF/ICM (EF prev as low as 25-30%  40-45% by echo 01/2017), Afib/flutter s/p DCCV in 2014 and on chronic amio/Eliquis, prior DVT, IDDM, prior smoking abuse, carotid arteyr disease s/p right sided CEA in 2010, AAA s/p repair in 12/2006, sinus node dysfunction s/p PPM, CKD stage IV, HTN, HLD, obesity, COPD on supplemental oxygen, and orthostatic hypotension. Cardiology is asked to evaluate for chest pain.   No ischemic evaluations since his CABG.   Prior echo in 02/2012 showed EF 30-35%, dilated LV, moderately dilated LA, moderate pulmonary HTN with PASP 50-60 mmHg. Most recent echo from 01/2017 showed improved EF of 45-50%, moderate LVH, diffuse HK, not technically sufficient to allow for LV diastolic function, mitral valve s/p annuloplasty ring with trivial residual MR, moderately dilated LA.   Patient has recently been admitted mid July 2018 with orthostatic hypotension. Recommended to hold antihypertensive medications with increasing of midodrine and addition of Sudafed with addition of abdominal binder. Previously taking torsemide and low dose losartan every other day. Previously on Florinef, though held prior to July admission given his CHF. Recent carotid ultrasound in 01/5680 showed 27% LICA stenosis with recommendation for outpatient follow up.   Patient was admitted  on 8/25 with persistent weakness, dizziness and RLE weakness of uncertain duration. He was noted to be orthostatic. Seen by vascular who recommended outpatient follow up. Seen by neurology who performed serial CT head which were negative. Unable to have MRI given PPM. Troponin minimally elevated a flat trending at 0.04 x 2, SCr 2.48-->2.42 (approximate baseline), HGB 11.2, PLT 142. Carotid ultrasound was repeated and again showed ~ 51% LICA stenosis. PPM has appeared to be functioning normally on telemetry. Patient noted also noted a brief episode of chest pain at rest on 8/25 that was rated 5/10 and improved with EMS repositioning. Pain lasted in total ~ 5 minutes. No associated SOB, diaphoresis, nausea, vomiting, or palpitations. He has not had any pain since.    Past Medical History:  Diagnosis Date  . AAA (abdominal aortic aneurysm)repaired    a. 12/2006 s/p repair of 6cm infrarenal AAA with 110mm Dacron graft.  . Anemia   . Anginal pain ([place])   . Atrial fibrillation/Flutter    a. Dx 2014;  b. 10/2012 s/p TEE/DCCV;  c. On amio and Eliquis (CHA2DS2VASc = 6).  . Basal cell carcinoma    a. 2013 x 2 back and left neck  . BPH (benign prostatic hypertrophy)   . Carotid artery occlusion    a. 11/2008 s/p R CEA; b. 01/2017 Carotid U/S: RICA <70%, LICA >01%.  . Chronic systolic heart failure ([place])    a. 02/2012 Echo: EF 30-35%, Pulm HTN, and modest RV dysfunction;  b. 10/2012 TEE: EF 25-30%, mild conc LVH, diff HK, mod RV dysfxn, mod dil RA/LA, mild MR, no MS, nl MVR, mod TR, Ao sclerosis; c. 01/2017 Echo: EF 45-50%, diff HK, triv  MR, mod dil LA.  . CKD (chronic kidney disease), stage IV ([place])   . Coronary artery disease    a. 1999 PCI RCA;  b. 01/2005 NSTEMI: CABG x 3 (LIMA->LAD, VG->OM, VG->LPL) @ time of MVR.  . Diabetes mellitus, insulin dependent (IDDM), controlled ([place])   . DVT (deep venous thrombosis) ([place])   . GERD (gastroesophageal reflux disease)   . Heart disease   . History of hiatal hernia     . History of shingles   . Hyperlipidemia   . Hyperlipidemia, unspecified   . Hypertension   . Hypertensive heart disease   . Mitral regurgitation--s/p repair    a. 01/2005 MVR: 28-mm Edwards ET Logix ring annuloplasty; b. 10/2012 TEE: normal fxning MV ring with mild MR, no MS; 01/2017 Echo: Triv MR.  . Mixed Ischemic and Non-ischemic Cardiomyopathy    a. 02/2012 Echo: EF 30-35%, Pulm HTN, and modest RV dysfunction;  b. 10/2012 TEE: EF 25-30%, mild conc LVH, diff HK, mod RV dysfxn, mod dil RA/LA, mild MR, no MS, nl MVR, mod TR, Ao sclerosis; c. 01/2017 Echo: EF 45-50%.  . Myocardial infarction ([place])    2006  . Neuropathy    SOME  . Obesity   . Onychocryptosis   . Onychomycosis   . Orthopnea   . Orthostatic hypotension    a. 11/2016 -> meds reduced;  b. 01/2017 admitted w/ presyncope and orthostasis->Meds held.  . Oxygen deficit    a. Wears 2 lpm most of the day and @ HS.  . Paresthesias    in the feet  . Paronychia of third toe, right    contusion with paronychia right third toe  . Presence of permanent cardiac pacemaker   . PVC (premature ventricular contraction)    a. 02/2009 s/p RFCA ([place]).  . Sick sinus syndrome ([place])    a. 01/2005 s/p PPM 2/2 heart block following CABG;  b. 05/2012 Gen Change: MDT Adapta ADDRL1 DC PPM, ser # [id number].  . Sleep apnea    a. Intolerant of CPAP  . Type II diabetes mellitus ([place])       Most Recent Cardiac Studies: Echo 01/2017: Study Conclusions  - Left ventricle: Poor image quality even with definity. The cavity   size was normal. Wall thickness was increased in a pattern of   moderate LVH. Systolic function was mildly reduced. The estimated   ejection fraction was in the range of 45% to 50%. Diffuse   hypokinesis. The study is not technically sufficient to allow   evaluation of LV diastolic function. - Mitral valve: Post repair with annuloplasty ring trivial residual   MR normal diastolic gradients. Valve area by pressure half-time:    1.79 cm^2. - Left atrium: The atrium was moderately dilated.   Surgical History:  Past Surgical History:  Procedure Laterality Date  . ABDOMINAL AORTIC ANEURYSM REPAIR      May 2008,   . CARDIAC CATHETERIZATION  2006   @ [facility]  . CATARACT EXTRACTION     right  . CATARACT EXTRACTION W/PHACO Left 10/04/2016   Procedure: CATARACT EXTRACTION PHACO AND INTRAOCULAR LENS PLACEMENT (IOC);  Surgeon: Estill Cotta, MD;  Location: [facility] ORS;  Service: Ophthalmology;  Laterality: Left;  [place] 01:21 AP% 25.9 CDE 39.08 Fluid pack lot # [id number]   . CORONARY ARTERY BYPASS GRAFT     CABG with a  LIMA to the LAD, SVG to circumflex, SVG to posterolateral [id number].  Randolm Idol / REPLACE / REMOVE PACEMAKER  2006  Dr. [name]  . MITRAL VALVE REPAIR  2006    mitral valve repair with a #28 Edwards logic ring  . PACEMAKER PLACEMENT    . PERMANENT PACEMAKER GENERATOR CHANGE N/A 06/26/2012   Procedure: PERMANENT PACEMAKER GENERATOR CHANGE;  Surgeon: Deboraha Sprang, MD;  Location: [facility] CATH LAB;  Service: Cardiovascular;  Laterality: N/A;  . SKIN BIOPSY     left wrist; right elbow  . SKIN CANCER EXCISION      x 6  . SKIN CANCER EXCISION    . TRANSESOPHAGEAL ECHOCARDIOGRAM    . VASECTOMY       Home Meds: Prior to Admission medications   Medication Sig Start Date End Date Taking? Authorizing Provider  ACCU-CHEK FASTCLIX LANCETS MISC TEST twice a day if needed 07/04/16  Yes [name], MD  ACCU-CHEK SMARTVIEW test strip TEST twice a day 07/05/16  Yes [name], MD  albuterol (PROVENTIL) (2.5 MG/3ML) 0.083% nebulizer solution Take 3 mLs (2.5 mg total) by nebulization every 6 (six) hours as needed for wheezing or shortness of breath. 02/07/17  Yes Wieting, Delfino Lovett, MD  amiodarone (PACERONE) 200 MG tablet take 1 tablet by mouth once daily 01/08/17  Yes Gollan, Kathlene November, MD  BD INSULIN SYRINGE ULTRAFINE 31G X 5/16" 0.3 ML MISC use twice a day as directed 04/03/16  Yes [name], MD    calcitRIOL (ROCALTROL) 0.5 MCG capsule Take 0.5 mcg by mouth daily.   Yes [provider]  Cholecalciferol (VITAMIN D3) 2000 units capsule Take 2,000 Units by mouth daily.   Yes [provider]  collagenase (SANTYL) ointment Apply 1 application topically daily. Apply a thick layer topically to wound on left chest, cover with ns damp to dry gauze, cover with Allevyn. Change gauze daily. Change Allevyn every 3 days.   Yes [provider]  dextrose (GLUTOSE) 40 % GEL Take 1 Tube by mouth once as needed for low blood sugar. Give 1 tube every 30 minutes orally as needed until BS > 70 and pt is still responsive.   Yes [provider]  docusate sodium (COLACE) 100 MG capsule Take 100 mg by mouth 2 (two) times daily.    Yes [provider]  ELIQUIS 5 MG TABS tablet take 1 tablet by mouth twice a day 07/31/16  Yes [name], MD  finasteride (PROSCAR) 5 MG tablet Take 1 tablet (5 mg total) by mouth daily after supper. 03/20/17  Yes Vaughan Basta, MD  fludrocortisone (FLORINEF) 0.1 MG tablet Take 0.1 mg by mouth daily.   Yes [provider]  glucagon (GLUCAGON EMERGENCY) 1 MG injection Inject 1 mg into the vein once as needed. Give 1 mg for hypoglycemia (symptomatic) not responding to oral glucose, snack. May repeat x 1 after 15 minutes. USE ONLY IF PT IS OR IS BECOMING UNRESPONSIVE.   Yes [provider]  GLUCERNA Barrie Folk) LIQD Take 1 Can by mouth 2 (two) times daily between meals.   Yes [provider]  hydrocortisone (CORTEF) 10 MG tablet Take 10 mg by mouth 2 (two) times daily.   Yes [provider]  insulin lispro protamine-lispro (HUMALOG MIX 75/25) (75-25) 100 UNIT/ML SUSP injection Inject 5 Units into the skin 2 (two) times daily with a meal. Patient taking differently: Inject 5 Units into the skin 2 (two) times daily with a meal. Taking 10 units 02/07/17  Yes Wieting, Richard, MD  Melatonin 3 MG TABS  Take 1 tablet by mouth  at bedtime.   Yes [provider]  Multiple Vitamins-Minerals (MULTIVITAL) tablet Take 1 tablet by mouth daily.   Yes [provider]  omeprazole (PRILOSEC) 20 MG capsule Take 20 mg by mouth daily. Monday, Wednesday, friday    Yes [provider]  OXYGEN Inhale 3 L into the lungs continuous.    Yes [provider]  polyethylene glycol (MIRALAX / GLYCOLAX) packet Take 17 g by mouth daily as needed for mild constipation. 06/12/16  Yes Fritzi Mandes, MD  rosuvastatin (CRESTOR) 20 MG tablet take 1 tablet by mouth at bedtime 10/02/16  Yes Gollan, Kathlene November, MD  Skin Protectants, Misc. (ENDIT EX) Apply liberal amount topically to gluteal fold wound and surrounding tissue 2 times daily and as needed. Do not scrub skin. If soiled, wipe off top layer and add more cream.   Yes [provider]  triamcinolone (NASACORT ALLERGY 24HR) 55 MCG/ACT AERO nasal inhaler Place 2 sprays into the nose daily.    Yes [provider]  Zinc Oxide (BOUDREAUXS BUTT PASTE) 16 % OINT Apply liberal amount topically to areas of skin irritation 2 times daily and as needed. Ok to leave at bedside.   Yes [provider]  zolpidem (AMBIEN) 5 MG tablet Take 5 mg by mouth at bedtime as needed for sleep.   Yes [provider]  Amino Acids-Protein Hydrolys (FEEDING SUPPLEMENT, PRO-STAT SUGAR FREE 64,) LIQD Take 30 mLs by mouth 2 (two) times daily between meals.    [provider]  bacitracin 500 UNIT/GM ointment Apply 1 application topically daily. Apply to tip of 2nd toe    [provider]    Inpatient Medications:  . amiodarone  200 mg Oral Daily  . apixaban  5 mg Oral BID  . aspirin EC  81 mg Oral Daily  . calcitRIOL  0.5 mcg Oral Q breakfast  . docusate sodium  100 mg Oral BID  . feeding supplement (PRO-STAT SUGAR FREE 64)  30 mL Oral BID BM  . finasteride  5 mg Oral PC supper  . fludrocortisone  0.1 mg Oral Daily  .  hydrocortisone  10 mg Oral BID  . insulin aspart  0-9 Units Subcutaneous TID WC  . insulin aspart protamine- aspart  5 Units Subcutaneous BID WC  . lisinopril  5 mg Oral Daily  . mouth rinse  15 mL Mouth Rinse BID  . Melatonin  2.5 mg Oral QHS  . pantoprazole  40 mg Oral QAC breakfast  . rosuvastatin  20 mg Oral QHS  . sodium chloride flush  3 mL Intravenous Q12H  . triamcinolone  2 spray Nasal Daily   . sodium chloride      Allergies:  Allergies  Allergen Reactions  . Shellfish Allergy Anaphylaxis    Social History   Social History  . Marital status: Married    Spouse name: N/A  . Number of children: 5  . Years of education: college degree   Occupational History  . Retired     Engineer, structural in [place]  .  Retired   Social History Main Topics  . Smoking status: Former Smoker    Packs/day: 1.50    Years: 34.00    Types: Cigarettes    Quit date: 08/28/1987  . Smokeless tobacco: Never Used  . Alcohol use 0.6 oz/week    1 Cans of beer per week     Comment: Occasionally but none since his surgery in June  . Drug use: No  .  Sexual activity: No   Other Topics Concern  . Not on file   Social History Narrative   Lives locally with wife.  Sedentary - sits most of the day.  Ambulates some around his home - uses a walker.     Family History  Problem Relation Age of Onset  . Heart attack Mother 40  . Diabetes Sister   . Hypertension Sister   . Heart attack Sister   . Heart attack Brother 56       3 Brothers deceased from [place] in 35's or 65's  . Heart disease Brother        Heart Disease before age 38  . Heart attack Father 40  . Diabetes Father        Amputation: bilateral feet  . Heart disease Father   . Heart attack Sister   . Heart attack Sister   . Heart attack Brother 64  . Heart attack Brother 43  . Diabetes Son      Review of Systems: Review of Systems  Constitutional: Positive for malaise/fatigue. Negative for chills, diaphoresis, fever and  weight loss.  HENT: Negative for congestion.   Eyes: Negative for discharge and redness.  Respiratory: Positive for shortness of breath. Negative for cough, hemoptysis, sputum production and wheezing.   Cardiovascular: Positive for chest pain and leg swelling. Negative for palpitations, orthopnea, claudication and PND.  Gastrointestinal: Negative for abdominal pain, blood in stool, heartburn, melena, nausea and vomiting.  Genitourinary: Negative for hematuria.  Musculoskeletal: Negative for falls and myalgias.  Skin: Negative for rash.  Neurological: Positive for dizziness and weakness. Negative for tingling, tremors, sensory change, speech change, focal weakness and loss of consciousness.  Endo/Heme/Allergies: Does not bruise/bleed easily.  Psychiatric/Behavioral: Negative for substance abuse. The patient is not nervous/anxious.   All other systems reviewed and are negative.   Labs:  Recent Labs  04/21/17 0117 04/21/17 0444  TROPONINI 0.04* 0.04*   Lab Results  Component Value Date   WBC 5.7 04/22/2017   HGB 10.9 (L) 04/22/2017   HCT 33.6 (L) 04/22/2017   MCV 97.3 04/22/2017   PLT 142 (L) 04/22/2017    Recent Labs Lab 04/23/17 0354  NA 144  K 4.2  CL 105  CO2 34*  BUN 43*  CREATININE 2.36*  CALCIUM 9.1  GLUCOSE 126*   Lab Results  Component Value Date   CHOL 114 02/02/2017   HDL 37 (L) 02/02/2017   LDLCALC 66 02/02/2017   TRIG 56 02/02/2017   No results found for: DDIMER  Radiology/Studies:  Ct Head Wo Contrast  Result Date: 04/22/2017 IMPRESSION: Stable exam. Chronic changes as described. No acute infarction or hemorrhage is evident. Electronically Signed   By: Staci Righter M.D.   On: 04/22/2017 09:01   Ct Head Wo Contrast  Result Date: 04/21/2017 IMPRESSION: 1. No acute abnormality. 2. Interval visualization of old right caudate lacunar infarcts. 3. Stable old right basal ganglia lacunar infarct or prominent perivascular space. 4. Stable mild diffuse  cerebral and cerebellar atrophy. 5. Stable mild chronic small vessel white matter ischemic changes in both cerebral hemispheres. Electronically Signed   By: Claudie Revering M.D.   On: 04/21/2017 02:13   US Carotid Bilateral  Result Date: 04/21/2017 IMPRESSION: Atherosclerotic disease in bilateral carotid arteries, left greater than right. Large amount of calcified plaque in the proximal left internal carotid artery. Estimated degree of stenosis in the left internal carotid artery is greater than 70% based on the velocities. Mild atherosclerotic disease  in the right carotid arteries. The peak systolic velocities in the right internal carotid artery suggest a stenosis between 50-69% (closer to 69%). These velocities are discrepant with the degree of stenosis based on the color Doppler images and grayscale imaging. Concern for severe stenosis in the proximal left internal carotid artery but limited evaluation due to calcified shadowing plaque in this area. There is also discrepancy between the degree of stenosis in the right internal carotid artery based on velocities and color Doppler imaging. Carotid artery stenosis could be better characterized with MRA or CTA of the neck. Patent vertebral arteries with antegrade flow. These results will be called to the ordering clinician or representative by the Radiologist Assistant, and communication documented in the PACS or zVision Dashboard. Electronically Signed   By: Markus Daft M.D.   On: 04/21/2017 15:54    EKG: Interpreted by me showed: A paced, 60 bpm Telemetry: Interpreted by me showed: A paced, 70s bpm  Weights: Filed Weights   04/21/17 1039 04/22/17 0500 04/23/17 0500  Weight: 251 lb (113.9 kg) 251 lb 3.2 oz (113.9 kg) 252 lb 9.6 oz (114.6 kg)     Physical Exam: Blood pressure (!) 146/72, pulse 72, temperature 97.6 F (36.4 C), temperature source Oral, resp. rate (!) 24, height 6' (1.829 m), weight 252 lb 9.6 oz (114.6 kg), SpO2 96 %. Body mass index  is 34.26 kg/m. General: Well developed, well nourished, in no acute distress. Head: Normocephalic, atraumatic, sclera non-icteric, no xanthomas, nares are without discharge.  Neck: Negative for carotid bruits. JVD not elevated. Lungs: Clear bilaterally to auscultation without wheezes, rales, or rhonchi. Breathing is unlabored. Heart: RRR with S1 S2. No murmurs, rubs, or gallops appreciated. Abdomen: Soft, non-tender, non-distended with normoactive bowel sounds. No hepatomegaly. No rebound/guarding. No obvious abdominal masses. Msk:  Strength and tone appear normal for age. Extremities: No clubbing or cyanosis. Trace pre-tibial edema. Distal pedal pulses are 2+ and equal bilaterally. Neuro: Alert and oriented X 3. No facial asymmetry. No focal deficit. Moves all extremities spontaneously. Psych:  Responds to questions appropriately with a normal affect.    Assessment and Plan:  Active Problems:   Dizziness    1. Chest pain/elevated troponin: -Currently, chest pain free -Had a brief episode of chest pain on 8/25 rated 5/10 that improved when EMS got him up -Has not had any chest pain since -Troponin minimally elevated at 0.04 x 2, cycle this morning -Possibly supply demand ischemia in the setting of CKD stage IV, anemia of chronic disease, and orthostatic hypotension  -Recent echo as above, no need to repeat -Consider outpatient stress testing -Continue ASA  2. CAD/CABG: -As above  3. Afib/flutter: -Currently A paced -Continue Eliquis (on dual therapy) -Not on rate controlling medications given issues with orthostasis -Amiodarone  4. Dizziness/weakness/orthostatic hypotension: -Noted to be orthostatic on 8/25 with drop in SBP from 176-->152 -S/p IV fluids -Recommend rechecking orthostatic vital signs this morning -Restarted on Florinef this admission by IM -On Cortisol  -Abdominal binder, still has not received this (will ask case manager to look into this) -Does not  appear he is taking Sudafed any longer  5. Carotid artery disease: -Carotid ultrasound with 44% LICA stenosis -Vascular surgery has reccommended outpatient follow up -Crestor  6. MVR: -Recent echo with stable valvular heart disease as above  7. CKD stage IV: -Stable -Monitor    Signed, Christell Faith, PA-C [facility] Pager: [phone] 04/23/2017, 7:16 AM

## 2017-04-23 NOTE — Progress Notes (Signed)
Physical Therapy Treatment Patient Details Name: Marco Hermiz Sr. MRN: [id number] DOB: 02/06/1936 Today's Date: 04/23/2017    History of Present Illness 81 yo M, admitted 7/19 due to near syncopal episode now returns with dizziness/weakness. PMH includes: AAA repair, anemia, anginal pain, A-fib, basal cell carcinoma, BPH, carotid ant occlusion, chronic systolic heart failure, CKD, DVT, GERD, HLD, HTN, heart disease, MI, orthopenia, OH, PVC, sleep apnea, and type II diabetes mellitus.     PT Comments    Pt agreeable to PT for bed exercises only due to feeling extremely fatigued and weakened. Pt participates in supine exercises with assist as needed. Pt lethargic throughout, but participates with assist. Cues for eccentric control with heel slide and short arc quad. Encouraged performance off isometric exercises/ankle pumps throughout the day. Continue PT to progress endurance and strength as appropriate to improve function.   Follow Up Recommendations  SNF     Equipment Recommendations       Recommendations for Other Services       Precautions / Restrictions Precautions Precautions: Fall Restrictions Weight Bearing Restrictions: No    Mobility  Bed Mobility               General bed mobility comments: Not tested; refused up/out of bed  Transfers                    Ambulation/Gait                 Stairs            Wheelchair Mobility    Modified Rankin (Stroke Patients Only)       Balance                                            Cognition Arousal/Alertness: Lethargic Behavior During Therapy: WFL for tasks assessed/performed Overall Cognitive Status: Within Functional Limits for tasks assessed                                        Exercises General Exercises - Lower Extremity Ankle Circles/Pumps: AROM;Both;20 reps;Supine Quad Sets: Strengthening;Both;20 reps;Supine Gluteal Sets:  Strengthening;Both;20 reps;Supine Short Arc Quad: AROM;Both;Supine;10 reps (2 sets) Heel Slides: AAROM;Both;20 reps;Supine Hip ABduction/ADduction:  (attempted; too fatigued to continue exercises)    General Comments        Pertinent Vitals/Pain Pain Assessment: No/denies pain    Home Living                      Prior Function            PT Goals (current goals can now be found in the care plan section) Progress towards PT goals: Not progressing toward goals - comment    Frequency    Min 2X/week      PT Plan Current plan remains appropriate    Co-evaluation              AM-PAC PT "6 Clicks" Daily Activity  Outcome Measure  Difficulty turning over in bed (including adjusting bedclothes, sheets and blankets)?: Unable Difficulty moving from lying on back to sitting on the side of the bed? : Unable Difficulty sitting down on and standing up from a chair with arms (e.g., wheelchair, bedside commode, etc,.)?: Unable Help needed  moving to and from a bed to chair (including a wheelchair)?: Total Help needed walking in hospital room?: Total Help needed climbing 3-5 steps with a railing? : Total 6 Click Score: 6    End of Session Equipment Utilized During Treatment: Oxygen Activity Tolerance: Patient limited by fatigue;Patient limited by lethargy;Other (comment) (weakness) Patient left: in bed;with call bell/phone within reach;with bed alarm set   PT Visit Diagnosis: Muscle weakness (generalized) (M62.81);Difficulty in walking, not elsewhere classified (R26.2)     Time: 1553-1610 PT Time Calculation (min) (ACUTE ONLY): 17 min  Charges:  $Therapeutic Exercise: 8-22 mins                    G Codes:        Larae Grooms, PTA 04/23/2017, 4:10 PM

## 2017-04-23 NOTE — Plan of Care (Signed)
Problem: Safety: Goal: Ability to remain free from injury will improve Outcome: Progressing Patient is high fall risk. Yellow arm band and yellow socks are on patient. Bed alarm is on.   

## 2017-04-23 NOTE — Progress Notes (Signed)
[place] [facility] [place] NAME: Marco [name]    MR#:  [id number]  DATE OF BIRTH:  [birth year]  SUBJECTIVE:  CHIEF COMPLAINT:   Chief Complaint  Patient presents with  . Dizziness   Drowzy. Wife at bedside.  REVIEW OF SYSTEMS:    Review of Systems  Constitutional: Positive for malaise/fatigue. Negative for chills and fever.  HENT: Negative for sore throat.   Eyes: Negative for blurred vision, double vision and pain.  Respiratory: Negative for cough, hemoptysis, shortness of breath and wheezing.   Cardiovascular: Negative for chest pain, palpitations, orthopnea and leg swelling.  Gastrointestinal: Negative for abdominal pain, constipation, diarrhea, heartburn, nausea and vomiting.  Genitourinary: Negative for dysuria and hematuria.  Musculoskeletal: Negative for back pain and joint pain.  Skin: Negative for rash.  Neurological: Positive for dizziness, focal weakness and weakness. Negative for sensory change, speech change and headaches.  Endo/Heme/Allergies: Does not bruise/bleed easily.  Psychiatric/Behavioral: Negative for depression. The patient is not nervous/anxious.    DRUG ALLERGIES:   Allergies  Allergen Reactions  . Shellfish Allergy Anaphylaxis    VITALS:  Blood pressure (!) 148/76, pulse 75, temperature 97.6 F (36.4 C), temperature source Oral, resp. rate 20, height 6' (1.829 m), weight 114.6 kg (252 lb 9.6 oz), SpO2 96 %.  PHYSICAL EXAMINATION:   Physical Exam  GENERAL:  81 y.o.-year-old patient lying in the bed with no acute distress. EYES: Pupils equal, round, reactive to light and accommodation. No scleral icterus. Extraocular muscles intact. HEENT: Head atraumatic, normocephalic. Oropharynx and nasopharynx clear. NECK:  Supple, no jugular venous distention. No thyroid enlargement, no tenderness.  LUNGS: Normal breath sounds bilaterally, no wheezing, rales, rhonchi. No use of accessory muscles of respiration.   CARDIOVASCULAR: S1, S2 normal. No murmurs, rubs, or gallops.  ABDOMEN: Soft, nontender, nondistended. Bowel sounds present. No organomegaly or mass. EXTREMITIES: No cyanosis, clubbing or edema b/l.    NEUROLOGIC: Cranial nerves II through XII are intact. No focal Motor or sensory deficits b/l. PSYCHIATRIC: The patient is oriented x 3. Drowzy. SKIN: No obvious rash, lesion, or ulcer.   LABORATORY PANEL:   CBC  Recent Labs Lab 04/22/17 0417  WBC 5.7  HGB 10.9*  HCT 33.6*  PLT 142*   ------------------------------------------------------------------------------------------------------------------ Chemistries   Recent Labs Lab 04/23/17 0354  NA 144  K 4.2  CL 105  CO2 34*  GLUCOSE 126*  BUN 43*  CREATININE 2.36*  CALCIUM 9.1   ------------------------------------------------------------------------------------------------------------------  Cardiac Enzymes  Recent Labs Lab 04/23/17 0354  TROPONINI 0.03*   ------------------------------------------------------------------------------------------------------------------  RADIOLOGY:  Ct Head Wo Contrast  Result Date: 04/22/2017 CLINICAL DATA:  Continued surveillance of dizziness and RIGHT leg weakness. EXAM: CT HEAD WITHOUT CONTRAST TECHNIQUE: Contiguous axial images were obtained from the base of the skull through the vertex without intravenous contrast. COMPARISON:  04/21/2017. FINDINGS: The patient has contraindications to MRI and CTA head. Repeat CT head is performed. Brain: Generalized atrophy. Chronic microvascular ischemic change. Chronic lacunar infarctions of the periventricular white matter and posterior limb internal capsule on the RIGHT are stable. No interval change in the appearance of the brain since yesterday's scan. No new areas of concern for cortical or subcortical infarction. No hemorrhage, hydrocephalus, or extra-axial fluid. Vascular: Calcification of the cavernous internal carotid arteries consistent  with cerebrovascular atherosclerotic disease. No signs of intracranial large vessel occlusion. Skull: Normal. Negative for fracture or focal lesion. Sinuses/Orbits: No acute finding. Other: None. IMPRESSION: Stable exam. Chronic changes as described. No acute infarction  or hemorrhage is evident. Electronically Signed   By: Staci Righter M.D.   On: 04/22/2017 09:01     ASSESSMENT AND PLAN:   * Acute CVA with RLE weakness  CT scan of the head shows nothing acute other than old strokes. Cannot get an MRI due to permanent pacemaker. CTH repeated and no change. He does have greater than 70% carotid artery occlusion. Recent echocardiogram reviewed and shows no thrombus. Does have risk factor of atrial fibrillation and is on Eliquis. Discussed with neurology Dr. Irish Elders.  * Left carotid stenosis >70% Discussed with Dr. Lucky Cowboy. Scheduled for procedure 04/25/2017.  * Chest pain Resolved Consulted cardiology and suggested stress test. Ordered for tomorrow morning  * Chronic dizziness. Continue to monitor. Orthostatics positive. Bolused IV fluid yesterday  * Insulin dependent diabetes mellitus. Continue home dose. Sliding scale insulin added. Diabetic diet.  * Uncontrolled hypertension. Patient's blood pressure. medications were stopped due to having worsening. orthostatic hypotension. started low dose lisinopril at 5 mg daily.  * Chronic systolic congestive heart failure. Monitor for any fluid overload.  * CKD stage IV. Stable. Monitor input and output.  * DVT prophylaxis. On Eliquis.  All the records are reviewed and case discussed with Care Management/Social Worker Management plans discussed with the patient, family and they are in agreement.  CODE STATUS: DNR  DVT Prophylaxis: SCDs  TOTAL TIME TAKING CARE OF THIS PATIENT: 35 minutes.  Possible D/C after carotid surgery.  Hillary Bow R M.D on 04/23/2017 at 6:46 PM  Between 7am to 6pm - Pager - [phone]  After 6pm  go to [email or web address] - password [email or web address] [facility]  Office  [phone]  CC: Primary care physician; [name], MD  Note: This dictation was prepared with Dragon dictation along with smaller phrase technology. Any transcriptional errors that result from this process are unintentional.

## 2017-04-24 ENCOUNTER — Ambulatory Visit (HOSPITAL_BASED_OUTPATIENT_CLINIC_OR_DEPARTMENT_OTHER): Payer: Medicare Other

## 2017-04-24 ENCOUNTER — Encounter
Admission: EM | Disposition: A | Discharge status: Hospice | Payer: Self-pay | Source: Home / Self Care | Attending: Internal Medicine

## 2017-04-24 DIAGNOSIS — R9439 Abnormal result of other cardiovascular function study: Secondary | ICD-10-CM

## 2017-04-24 DIAGNOSIS — R079 Chest pain, unspecified: Secondary | ICD-10-CM | POA: Diagnosis not present

## 2017-04-24 DIAGNOSIS — I10 Essential (primary) hypertension: Secondary | ICD-10-CM

## 2017-04-24 DIAGNOSIS — R42 Dizziness and giddiness: Secondary | ICD-10-CM

## 2017-04-24 HISTORY — PX: CAROTID ANGIOGRAPHY: CATH118230

## 2017-04-24 LAB — NM MYOCAR MULTI W/SPECT W/WALL MOTION / EF
CSEPPHR: 75 {beats}/min
LVDIAVOL: 150 mL (ref 62–150)
LVSYSVOL: 98 mL
NUC STRESS TID: 1.07
Rest HR: 82 {beats}/min

## 2017-04-24 LAB — GLUCOSE, CAPILLARY
GLUCOSE-CAPILLARY: 104 mg/dL — AB (ref 65–99)
GLUCOSE-CAPILLARY: 181 mg/dL — AB (ref 65–99)
GLUCOSE-CAPILLARY: 260 mg/dL — AB (ref 65–99)
Glucose-Capillary: 104 mg/dL — ABNORMAL HIGH (ref 65–99)
Glucose-Capillary: 92 mg/dL (ref 65–99)

## 2017-04-24 LAB — TYPE AND SCREEN
ABO/RH(D): O POS
Antibody Screen: NEGATIVE

## 2017-04-24 SURGERY — CAROTID ANGIOGRAPHY
Anesthesia: Moderate Sedation

## 2017-04-24 MED ORDER — FENTANYL CITRATE (PF) 100 MCG/2ML IJ SOLN
INTRAMUSCULAR | Status: AC
  Filled 2017-04-24: qty 2

## 2017-04-24 MED ORDER — FAMOTIDINE 20 MG PO TABS
ORAL_TABLET | ORAL | Status: AC
  Administered 2017-04-24: 40 mg
  Filled 2017-04-24: qty 2

## 2017-04-24 MED ORDER — CEFUROXIME SODIUM 1.5 G IV SOLR
1.5000 g | INTRAVENOUS | Status: AC
  Administered 2017-04-24: 1.5 g via INTRAVENOUS
  Filled 2017-04-24: qty 1.5

## 2017-04-24 MED ORDER — TECHNETIUM TC 99M TETROFOSMIN IV KIT
30.0000 | PACK | Freq: Once | INTRAVENOUS | Status: AC | PRN
  Administered 2017-04-24: 32.057 via INTRAVENOUS

## 2017-04-24 MED ORDER — FENTANYL CITRATE (PF) 100 MCG/2ML IJ SOLN
INTRAMUSCULAR | Status: DC | PRN
  Administered 2017-04-24: 25 ug via INTRAVENOUS

## 2017-04-24 MED ORDER — MIDAZOLAM HCL 5 MG/5ML IJ SOLN
INTRAMUSCULAR | Status: AC
  Filled 2017-04-24: qty 5

## 2017-04-24 MED ORDER — METHYLPREDNISOLONE SODIUM SUCC 125 MG IJ SOLR
INTRAMUSCULAR | Status: AC
  Administered 2017-04-24: 125 mg
  Filled 2017-04-24: qty 2

## 2017-04-24 MED ORDER — INSULIN ASPART 100 UNIT/ML ~~LOC~~ SOLN
0.0000 [IU] | Freq: Every day | SUBCUTANEOUS | Status: DC
  Administered 2017-04-24: 22:00:00 3 [IU] via SUBCUTANEOUS
  Filled 2017-04-24: qty 1

## 2017-04-24 MED ORDER — LIDOCAINE-EPINEPHRINE (PF) 2 %-1:200000 IJ SOLN
INTRAMUSCULAR | Status: AC
  Filled 2017-04-24: qty 20

## 2017-04-24 MED ORDER — TECHNETIUM TC 99M TETROFOSMIN IV KIT
12.6030 | PACK | Freq: Once | INTRAVENOUS | Status: AC | PRN
  Administered 2017-04-24: 08:00:00 12.603 via INTRAVENOUS

## 2017-04-24 MED ORDER — SODIUM CHLORIDE 0.9 % IV SOLN
INTRAVENOUS | Status: DC
  Administered 2017-04-24: 12:00:00 via INTRAVENOUS

## 2017-04-24 MED ORDER — MIDAZOLAM HCL 2 MG/2ML IJ SOLN
INTRAMUSCULAR | Status: DC | PRN
  Administered 2017-04-24: 1 mg via INTRAVENOUS

## 2017-04-24 MED ORDER — HEPARIN (PORCINE) IN NACL 2-0.9 UNIT/ML-% IJ SOLN
INTRAMUSCULAR | Status: AC
  Filled 2017-04-24: qty 1000

## 2017-04-24 MED ORDER — INSULIN ASPART 100 UNIT/ML ~~LOC~~ SOLN
0.0000 [IU] | Freq: Three times a day (TID) | SUBCUTANEOUS | Status: DC
  Administered 2017-04-25: 09:00:00 2 [IU] via SUBCUTANEOUS
  Administered 2017-04-25: 1 [IU] via SUBCUTANEOUS
  Administered 2017-04-26: 18:00:00 2 [IU] via SUBCUTANEOUS
  Administered 2017-04-27 (×2): 1 [IU] via SUBCUTANEOUS
  Filled 2017-04-24 (×6): qty 1

## 2017-04-24 MED ORDER — HEPARIN SODIUM (PORCINE) 1000 UNIT/ML IJ SOLN
INTRAMUSCULAR | Status: AC
  Filled 2017-04-24: qty 1

## 2017-04-24 MED ORDER — REGADENOSON 0.4 MG/5ML IV SOLN
0.4000 mg | Freq: Once | INTRAVENOUS | Status: AC
  Administered 2017-04-24: 0.4 mg via INTRAVENOUS

## 2017-04-24 SURGICAL SUPPLY — 11 items
CATH ANGIO 5F 100CM .035 PIG (CATHETERS) ×3 IMPLANT
CATH BEACON 5 .035 100 H1 TIP (CATHETERS) ×3 IMPLANT
DEVICE STARCLOSE SE CLOSURE (Vascular Products) ×3 IMPLANT
DEVICE TORQUE (MISCELLANEOUS) ×3 IMPLANT
GLIDEWIRE ANGLED SS 035X260CM (WIRE) ×3 IMPLANT
KIT CAROTID MANIFOLD (MISCELLANEOUS) ×3 IMPLANT
PACK ANGIOGRAPHY (CUSTOM PROCEDURE TRAY) ×3 IMPLANT
SHEATH BRITE TIP 5FRX11 (SHEATH) ×3 IMPLANT
SYR MEDRAD MARK V 150ML (SYRINGE) ×3 IMPLANT
TUBING CONTRAST HIGH PRESS 72 (TUBING) ×3 IMPLANT
WIRE J 3MM .035X145CM (WIRE) ×3 IMPLANT

## 2017-04-24 NOTE — Progress Notes (Signed)
    No further chest pain. Patient is for Lexiscan Myoview this morning to evaluate for high-risk ischemia prior to possible vascular surgery on 8/29.

## 2017-04-24 NOTE — Op Note (Addendum)
[facility]   OPERATIVE NOTE  DATE: 04/24/2017  PRE-OPERATIVE DIAGNOSIS: 1. Bilateral carotid artery stenosis, left greater than right with stroke 2. Chronic kidney disease precluding CT angiogram  POST-OPERATIVE DIAGNOSIS: Same as above  PROCEDURE: 1.   Ultrasound Guidance for vascular access right femoral artery 2.   Catheter placement into right common carotid artery and into left common carotid artery from right femoral approach 3.   Thoracic aortogram 4.   Cervical and cerebral bilateral carotid angiograms 5.   StarClose closure device right femoral artery  SURGEON: Leotis Pain, MD  ASSISTANT(S): None  ANESTHESIA: Moderate conscious sedation  ESTIMATED BLOOD LOSS: minimal  FLUORO TIME: 5.9 minutes  CONTRAST: 30 cc  MODERATE CONSCIOUS SEDATION TIME: Approximately 25 minutes using 1 of Versed and 25 mcg of Fentanyl  FINDING(S): 1.  Right cervical carotid artery appeared to have some intimal hyperplasia at the distal endpoint of a previous endarterectomy site resulting in a 40-50% stenosis. The remainder of the cervical carotid artery was widely patent. Right sided intracranial flow showed no deficits with normal anterior and middle cerebral arteries. Left cervical carotid artery had a highly calcific and irregular stenosis at and beyond the origin of the internal carotid artery with some degree of ulceration and what appeared to be an 80-85% stenosis. The intracranial left-sided flow showed no obvious deficits with normal anterior and middle cerebral arteries.  SPECIMEN(S):  None  INDICATIONS:   Patient is a 81 Marco [name] who presents with stroke 1-2 weeks ago and a carotid duplex suggesting high-grade left carotid artery stenosis and moderate right carotid artery stenosis. The patient's renal function precluded CT angiogram. Catheter-based angiogram is performed for further evaluation. Risks and benefits are discussed and informed consent was  obtained.  DESCRIPTION: After obtaining full informed written consent, the patient was brought back to the operating room and placed supine upon the vascular suite table.  After obtaining adequate anesthesia, the patient was prepped and draped in the standard fashion.  Moderate conscious sedation was administered during a face to face encounter with the patient throughout the procedure with my supervision of the RN administering medicines and monitoring the patients vital signs and mental status throughout from the start of the procedure until the patient was taken to the recovery room. The right femoral artery was visualized with ultrasound and found to be calcific but patent. It was then accessed under direct ultrasound guidance without difficulty with a Seldinger needle. A J-wire and 5 French sheath were placed and a permanent image was recorded. The patient was given 3000 units of intravenous heparin. A pigtail catheter was placed into the ascending aorta and an LAO projection thoracic aortogram was performed. This showed reasonably normal origins of the great vessels with calcific stenosis at the proximal left common carotid artery resulting in a 30-40% stenosis.  I then selected a headhunter catheter and cannulated the innominate artery advancing into the mid right common carotid artery. Cervical and cerebral carotid angiography were then performed on the right side initially. Right cervical carotid artery appeared to have some intimal hyperplasia at the distal endpoint of a previous endarterectomy site resulting in a 40-50% stenosis. The remainder of the cervical carotid artery was widely patent. Right sided intracranial flow showed no deficits with normal anterior and middle cerebral arteries. I then turned my attention back to the thoracic aorta and removed the catheter from the right side. I selectively cannulated the left common carotid artery without difficulty with a headhunter catheter and advanced  into the mid left common carotid artery. Selective imaging was then performed of the cervical and cerebral carotid artery on the left. This demonstrated that the left cervical carotid artery had a highly calcific and irregular stenosis at and beyond the origin of the internal carotid artery with some degree of ulceration and what appeared to be an 80-85% stenosis. The intracranial left-sided flow showed no obvious deficits with normal anterior and middle cerebral arteries. Multiple views were taken in the cervical carotid artery bilaterally. At this point, we had imaging to plan our treatment and we elected to terminate the procedure. The diagnostic catheter was removed. Oblique arteriogram was performed of the right femoral artery and StarClose closure device was deployed in usual fashion with excellent hemostatic result. The patient tolerated the procedure well and was taken to the recovery room in stable condition.  COMPLICATIONS: None  CONDITION: Stable   Leotis Pain 04/24/2017 2:26 PM   This note was created with Dragon Medical transcription system. Any errors in dictation are purely unintentional.

## 2017-04-24 NOTE — Progress Notes (Signed)
Progress Note  Patient Name: Jeziel Hoffmann [facility] Date of Encounter: 04/24/2017  Primary Cardiologist: Rockey Situ  Subjective   Patient feels sluggish today. He has not had any further chest pain. He also denies shortness of breath, palpitations, and lightheadedness.  Inpatient Medications    Scheduled Meds: . [MAR Hold] amiodarone  200 mg Oral Daily  . [MAR Hold] aspirin EC  81 mg Oral Daily  . [MAR Hold] calcitRIOL  0.5 mcg Oral Q breakfast  . [MAR Hold] docusate sodium  100 mg Oral BID  . [MAR Hold] feeding supplement (PRO-STAT SUGAR FREE 64)  30 mL Oral BID BM  . [MAR Hold] finasteride  5 mg Oral PC supper  . [MAR Hold] fludrocortisone  0.1 mg Oral Daily  . [MAR Hold] hydrocortisone  10 mg Oral BID  . [MAR Hold] insulin aspart  0-9 Units Subcutaneous TID WC  . [MAR Hold] insulin aspart protamine- aspart  5 Units Subcutaneous BID WC  . [MAR Hold] lisinopril  5 mg Oral Daily  . [MAR Hold] mouth rinse  15 mL Mouth Rinse BID  . [MAR Hold] Melatonin  2.5 mg Oral QHS  . [MAR Hold] pantoprazole  40 mg Oral QAC breakfast  . [MAR Hold] rosuvastatin  20 mg Oral QHS  . [MAR Hold] sodium chloride flush  3 mL Intravenous Q12H  . [MAR Hold] triamcinolone  2 spray Nasal Daily   Continuous Infusions: . [MAR Hold] sodium chloride    . sodium chloride 20 mL/hr at 04/24/17 1207   PRN Meds: [MAR Hold] sodium chloride, [MAR Hold] acetaminophen **OR** [MAR Hold] acetaminophen, [MAR Hold] albuterol, [MAR Hold] diphenhydrAMINE, [MAR Hold] ondansetron **OR** [MAR Hold] ondansetron (ZOFRAN) IV, [MAR Hold] polyethylene glycol, [MAR Hold] sodium chloride flush   Vital Signs    Vitals:   04/24/17 1415 04/24/17 1430 04/24/17 1432 04/24/17 1435  BP: (!) 166/94 (!) 183/82    Pulse: 75 75  75  Resp: (!) 21 (!) 25  14  Temp:      TempSrc:      SpO2: 98% 93% 90% 93%  Weight:      Height:       No intake or output data in the 24 hours ending 04/24/17 1445 Filed Weights   04/23/17 0500  04/24/17 0551 04/24/17 1223  Weight: 114.6 kg (252 lb 9.6 oz) 115 kg (253 lb 8 oz) 114.8 kg (253 lb)    Telemetry    Predominantly atrially paced and ventricularly sensed. Occasional ventricular pacing also present. - Personally Reviewed  ECG    None  Physical Exam   GEN: Obese, elderly man, lying in bed. Neck: No JVD Cardiac: RRR, no murmurs, rubs, or gallops.  Respiratory:  Mildly diminished breath sounds throughout without wheezes or crackles. GI: Soft, nontender, non-distended  MS:  Trace lower extremity edema bilaterally; No deformity. Neuro:  Nonfocal  Psych: Normal affect   Labs    Chemistry Recent Labs Lab 04/21/17 0117 04/22/17 0417 04/23/17 0354  NA 148* 148* 144  K 3.6 4.3 4.2  CL 104 108 105  CO2 35* 35* 34*  GLUCOSE 83 111* 126*  BUN 36* 36* 43*  CREATININE 2.48* 2.42* 2.36*  CALCIUM 9.4 9.3 9.1  GFRNONAA 23* 24* 25*  GFRAA 27* 28* 29*  ANIONGAP 9 5 5      Hematology Recent Labs Lab 04/21/17 0117 04/22/17 0417  WBC 7.1 5.7  RBC 3.58* 3.45*  HGB 11.2* 10.9*  HCT 34.3* 33.6*  MCV 95.7 97.3  MCH  31.3 31.6  MCHC 32.7 32.4  RDW 15.4* 15.7*  PLT 156 142*    Cardiac Enzymes Recent Labs Lab 04/21/17 0117 04/21/17 0444 04/23/17 0354  TROPONINI 0.04* 0.04* 0.03*   No results for input(s): TROPIPOC in the last 168 hours.   BNPNo results for input(s): BNP, PROBNP in the last 168 hours.   DDimer No results for input(s): DDIMER in the last 168 hours.   Radiology    Nm Myocar Multi W/spect W/wall Motion / Ef  Result Date: 04/24/2017  Abnormal, potenially high risk study (based predominantly on low LVEF).  There is a moderate in size, moderate in severity, partially reversible defect involving the inferolateral wall consistent with ischemia and scar.  Dilated left ventricle with moderately to severely reduced contraction (LVEF 32% by QGS).     Cardiac Studies   Transthoracic echocardiogram (02/02/17): Poorly visualized left ventricle,  which is grossly normal in size. Moderate LVH with LVEF of 45-50%. Mitral annuloplasty present with normal gradients. Moderate left atrial enlargement. Normal RV size and function.  Patient Profile     81 y.o. male man with history of coronary artery disease status post three-vessel CABG in 2006, severe regurgitation status post repair, PAD status post AAA repair, cerebrovascular disease, chronic kidney disease stage IV, hypertension, hyperlipidemia, and diabetes mellitus, whom we have been asked to evaluate in the setting of chest pain with recent right leg weakness concerning for stroke.  Assessment & Plan    Chest pain and abnormal stress test Chest pain was brief and nonexertional. Troponins have been minimally elevated and flat, peaking at 0.04. These findings are nonspecific. However, myocardial perfusion stress test today is high risk with LVEF of 32% and partially reversible inferolateral defect. Ideally, I think it would be worthwhile to proceed with cardiac catheterization prior to entertaining any elective surgical intervention. However, Mr. Cowles received IV contrast today for cerebral angiography. In the setting of his advanced chronic kidney disease and lack of ongoing myocardial ischemia, we should delay this for at least a few days to minimize the risk for contrast-induced nephropathy.  Recommend left heart catheterization with coronary and bypass graft angiography; this will need to be delayed at least 72 hours following today's contrast load to minimize risk for contrast-induced nephropathy.  Continue aspirin and rosuvastatin.  Ischemic cardiomyopathy LVEF moderately to severely reduced on stress test today. Echo in June showed mild LV dysfunction, though it was technically difficult.  Given poor image quality on prior echo, I do not believe that repeating a study at this time would add additional information. We must assume that Mr. Ratti's LVEF is at least moderately  reduced.  Consider adding low-dose carvedilol.  Continue lisinopril as renal function tolerates.  I would not add spironolactone due to chronic kidney disease and risk for hyperkalemia.  Cerebrovascular disease with possible stroke Surgical angiogram today demonstrates moderate right carotid disease status post endarterectomy and severe left internal carotid artery stenosis. Given high risk stress test findings, recent chest pain (albeit atypical), and other medical comorbidities, Mr. Parke is high risk for any elective surgery.  Recommend left heart catheterization with coronary/bypass angiography prior to any elective surgery (see details above).  Aggressive secondary prevention.  Given that Mr. Sullenberger is DO NOT RESUSCITATE and has numerous comorbidities, goals of care discussion and consideration of medical therapy rather than cerebrovascular and or coronary revascularization may be reasonable.  Chronic kidney disease stage IV Creatinine has been stable but will need to be monitored closely in the setting  of contrast exposure today.  Avoid nephrotoxic drugs.  Further management per internal medicine and nephrology.  Hypertension Blood pressure is suboptimally controlled, though in the setting of recent stroke, some degree of permissive hypertension as tolerated.  Consider adding carvedilol 3.125 mg twice a day for blood pressure control and optimization of heart failure and antianginal therapy.  Continue lisinopril with up titration as renal function allows.  Proximal atrial fibrillation Apixaban currently being held in the setting of invasive procedures.  Restart apixaban once invasive procedures are no longer planned.  Consider bridging with heparin in the setting of recent stroke and paroxysmal atrial fibrillation.  Signed, Nelva Bush, MD  04/24/2017, 2:45 PM

## 2017-04-24 NOTE — Progress Notes (Signed)
[name] NAME: Marco [name]    MR#:  [id number]  DATE OF BIRTH:  [birth year]  SUBJECTIVE:  CHIEF COMPLAINT:   Chief Complaint  Patient presents with  . Dizziness   More awake today. Feels dizzy on sitting or standing which improves in 1-2 minutes. No shortness of breath.  REVIEW OF SYSTEMS:    Review of Systems  Constitutional: Positive for malaise/fatigue. Negative for chills and fever.  HENT: Negative for sore throat.   Eyes: Negative for blurred vision, double vision and pain.  Respiratory: Negative for cough, hemoptysis, shortness of breath and wheezing.   Cardiovascular: Negative for chest pain, palpitations, orthopnea and leg swelling.  Gastrointestinal: Negative for abdominal pain, constipation, diarrhea, heartburn, nausea and vomiting.  Genitourinary: Negative for dysuria and hematuria.  Musculoskeletal: Negative for back pain and joint pain.  Skin: Negative for rash.  Neurological: Positive for dizziness, focal weakness and weakness. Negative for sensory change, speech change and headaches.  Endo/Heme/Allergies: Does not bruise/bleed easily.  Psychiatric/Behavioral: Negative for depression. The patient is not nervous/anxious.    DRUG ALLERGIES:   Allergies  Allergen Reactions  . Shellfish Allergy Anaphylaxis    VITALS:  Blood pressure (!) 166/94, pulse 78, temperature 98.8 F (37.1 C), temperature source Oral, resp. rate 20, height 6' (1.829 m), weight 114.8 kg (253 lb), SpO2 97 %.  PHYSICAL EXAMINATION:   Physical Exam  GENERAL:  81 y.o.-year-old patient lying in the bed with no acute distress. EYES: Pupils equal, round, reactive to light and accommodation. No scleral icterus. Extraocular muscles intact. HEENT: Head atraumatic, normocephalic. Oropharynx and nasopharynx clear. NECK:  Supple, no jugular venous distention. No thyroid enlargement, no tenderness.  LUNGS: Normal breath sounds bilaterally, no  wheezing, rales, rhonchi. No use of accessory muscles of respiration.  CARDIOVASCULAR: S1, S2 normal. No murmurs, rubs, or gallops.  ABDOMEN: Soft, nontender, nondistended. Bowel sounds present. No organomegaly or mass. EXTREMITIES: No cyanosis, clubbing or edema b/l.    NEUROLOGIC: Cranial nerves II through XII are intact. No focal Motor or sensory deficits b/l. PSYCHIATRIC: The patient is oriented x 3. . SKIN: No obvious rash, lesion, or ulcer.   LABORATORY PANEL:   CBC  Recent Labs Lab 04/22/17 0417  WBC 5.7  HGB 10.9*  HCT 33.6*  PLT 142*   ------------------------------------------------------------------------------------------------------------------ Chemistries   Recent Labs Lab 04/23/17 0354  NA 144  K 4.2  CL 105  CO2 34*  GLUCOSE 126*  BUN 43*  CREATININE 2.36*  CALCIUM 9.1   ------------------------------------------------------------------------------------------------------------------  Cardiac Enzymes  Recent Labs Lab 04/23/17 0354  TROPONINI 0.03*   ------------------------------------------------------------------------------------------------------------------  RADIOLOGY:  No results found.   ASSESSMENT AND PLAN:   * Acute CVA with RLE weakness  CT scan of the head shows nothing acute other than old strokes. Cannot get an MRI due to permanent pacemaker. CTH repeated and no change. He does have greater than 70% carotid artery occlusion. Recent echocardiogram reviewed and shows no thrombus. Does have risk factor of atrial fibrillation and is on Eliquis. Discussed with neurology Dr. Irish Elders.  * Left carotid stenosis >70% Discussed with Dr. Lucky Cowboy. Scheduled for procedure 04/25/2017.  * Chest pain Resolved Consulted cardiology and suggested stress test. Results pending  * Chronic dizziness. Continue to monitor. Orthostatics positive. Bolused IV fluid yesterday  * Insulin dependent diabetes mellitus. Continue home dose. Sliding scale  insulin added. Diabetic diet.  * pAfib Eliquis held for carotid surgery  * Uncontrolled hypertension. Patient's blood pressure. medications  were stopped due to having worsening. orthostatic hypotension. started low dose lisinopril at 5 mg daily.  * Chronic systolic congestive heart failure. Monitor for any fluid overload.  * CKD stage IV. Stable. Monitor input and output.  * DVT prophylaxis. On Eliquis.  All the records are reviewed and case discussed with Care Management/Social Worker Management plans discussed with the patient, family and they are in agreement.  CODE STATUS: DNR  DVT Prophylaxis: SCDs  TOTAL TIME TAKING CARE OF THIS PATIENT: 79 35 minutes.   Hillary Bow R M.D on 04/24/2017 at 1:02 PM  Between 7am to 6pm - Pager - [phone]  After 6pm go to [email or web address] - password [email or web address] [facility]  Office  [phone]  CC: Primary care physician; [name], MD  Note: This dictation was prepared with Dragon dictation along with smaller phrase technology. Any transcriptional errors that result from this process are unintentional.

## 2017-04-24 NOTE — H&P (Signed)
[facility] History & Physical Update  The patient was interviewed and re-examined.  The patient's previous History and Physical has been reviewed and is unchanged.  There is no change in the plan of care. We plan to proceed with the scheduled procedure.  Leotis Pain, MD  04/24/2017, 12:52 PM

## 2017-04-24 NOTE — Plan of Care (Signed)
Problem: Physical Regulation: Goal: Ability to maintain clinical measurements within normal limits will improve Outcome: Progressing Pt s/p carotid angiogram. Insertion site WDL. Dressing in place. Pt denies pain. VSS.

## 2017-04-24 NOTE — Progress Notes (Signed)
Patient stated he needed to void, but refused to use the urinal. Stated that he wanted to wait until he can sit up at 3:30pm. Report given to [facility]. Will return to assigned room.

## 2017-04-24 NOTE — Clinical Social Work Note (Signed)
CSW received referral for SNF.  Case discussed with case manager and plan is to discharge home with home health.  CSW to sign off please re-consult if social work needs arise.  Shanya Ferriss R. Tallyn Holroyd, MSW, LCSWA [phone]  

## 2017-04-24 NOTE — Progress Notes (Signed)
PT Cancellation Note  Patient Details Name: Marco Kernen Sr. MRN: [id number] DOB: 01/11/1936   Cancelled Treatment:    Reason Eval/Treat Not Completed: Patient declined, no reason specified. Treatment attempted; pt refused, as he is post procedure and still NPO; pt feels hungry and fatigued and feels he cannot participate at this time. Re attempt at a later time/date, as the schedule allows.    Larae Grooms, PTA 04/24/2017, 11:35 AM

## 2017-04-25 ENCOUNTER — Encounter
Admission: EM | Disposition: A | Discharge status: Hospice | Payer: Self-pay | Source: Home / Self Care | Attending: Internal Medicine

## 2017-04-25 ENCOUNTER — Encounter: Payer: Self-pay | Admitting: Vascular Surgery

## 2017-04-25 ENCOUNTER — Telehealth: Payer: Self-pay | Admitting: Family Medicine

## 2017-04-25 DIAGNOSIS — I951 Orthostatic hypotension: Secondary | ICD-10-CM

## 2017-04-25 DIAGNOSIS — I480 Paroxysmal atrial fibrillation: Secondary | ICD-10-CM

## 2017-04-25 DIAGNOSIS — R296 Repeated falls: Secondary | ICD-10-CM

## 2017-04-25 DIAGNOSIS — L89150 Pressure ulcer of sacral region, unstageable: Secondary | ICD-10-CM | POA: Diagnosis present

## 2017-04-25 DIAGNOSIS — Z7189 Other specified counseling: Secondary | ICD-10-CM

## 2017-04-25 DIAGNOSIS — R42 Dizziness and giddiness: Secondary | ICD-10-CM

## 2017-04-25 DIAGNOSIS — I5043 Acute on chronic combined systolic (congestive) and diastolic (congestive) heart failure: Secondary | ICD-10-CM

## 2017-04-25 DIAGNOSIS — N184 Chronic kidney disease, stage 4 (severe): Secondary | ICD-10-CM

## 2017-04-25 DIAGNOSIS — Z515 Encounter for palliative care: Secondary | ICD-10-CM

## 2017-04-25 DIAGNOSIS — Z951 Presence of aortocoronary bypass graft: Secondary | ICD-10-CM

## 2017-04-25 DIAGNOSIS — I25118 Atherosclerotic heart disease of native coronary artery with other forms of angina pectoris: Secondary | ICD-10-CM

## 2017-04-25 DIAGNOSIS — I639 Cerebral infarction, unspecified: Secondary | ICD-10-CM

## 2017-04-25 DIAGNOSIS — I208 Other forms of angina pectoris: Secondary | ICD-10-CM

## 2017-04-25 LAB — GLUCOSE, CAPILLARY
GLUCOSE-CAPILLARY: 158 mg/dL — AB (ref 65–99)
GLUCOSE-CAPILLARY: 185 mg/dL — AB (ref 65–99)
Glucose-Capillary: 146 mg/dL — ABNORMAL HIGH (ref 65–99)
Glucose-Capillary: 105 mg/dL — ABNORMAL HIGH (ref 65–99)

## 2017-04-25 LAB — CBC
HEMATOCRIT: 35 % — AB (ref 40.0–52.0)
Hemoglobin: 11.4 g/dL — ABNORMAL LOW (ref 13.0–18.0)
MCH: 31.5 pg (ref 26.0–34.0)
MCHC: 32.4 g/dL (ref 32.0–36.0)
MCV: 97.1 fL (ref 80.0–100.0)
PLATELETS: 178 10*3/uL (ref 150–440)
RBC: 3.61 MIL/uL — ABNORMAL LOW (ref 4.40–5.90)
RDW: 15.8 % — AB (ref 11.5–14.5)
WBC: 6.9 10*3/uL (ref 3.8–10.6)

## 2017-04-25 SURGERY — ENDARTERECTOMY, CAROTID
Anesthesia: Choice | Laterality: Left

## 2017-04-25 MED ORDER — LISINOPRIL 5 MG PO TABS
2.5000 mg | ORAL_TABLET | Freq: Every day | ORAL | Status: DC
  Administered 2017-04-26 – 2017-04-27 (×2): 2.5 mg via ORAL
  Filled 2017-04-25 (×2): qty 1

## 2017-04-25 MED ORDER — APIXABAN 5 MG PO TABS
5.0000 mg | ORAL_TABLET | Freq: Two times a day (BID) | ORAL | Status: DC
  Administered 2017-04-25 – 2017-04-27 (×5): 5 mg via ORAL
  Filled 2017-04-25 (×5): qty 1

## 2017-04-25 MED ORDER — LISINOPRIL 2.5 MG PO TABS: 2.5000 mg | ORAL_TABLET | Freq: Every day | ORAL | 0 refills | Status: AC

## 2017-04-25 MED ORDER — MORPHINE SULFATE (CONCENTRATE) 10 MG /0.5 ML PO SOLN: 10.0000 mg | ORAL | 0 refills | Status: AC | PRN

## 2017-04-25 MED ORDER — CARVEDILOL 3.125 MG PO TABS: 3.1250 mg | ORAL_TABLET | Freq: Two times a day (BID) | ORAL | 0 refills | Status: AC

## 2017-04-25 MED ORDER — HEPARIN BOLUS VIA INFUSION
5100.0000 [IU] | Freq: Once | INTRAVENOUS | Status: DC
  Filled 2017-04-25: qty 5100

## 2017-04-25 MED ORDER — HEPARIN (PORCINE) IN NACL 100-0.45 UNIT/ML-% IJ SOLN: 1400.0000 [IU]/h | INTRAMUSCULAR | Status: DC

## 2017-04-25 MED ORDER — CARVEDILOL 3.125 MG PO TABS
3.1250 mg | ORAL_TABLET | Freq: Two times a day (BID) | ORAL | Status: DC
  Administered 2017-04-25 – 2017-04-27 (×4): 3.125 mg via ORAL
  Filled 2017-04-25 (×4): qty 1

## 2017-04-25 NOTE — Progress Notes (Signed)
New referral for [facility] at home received from [facility]. Patient is a 81 year old man with a past medical history of CAD s/p CABG, chronic combined systolic and diastolic heart failure with EF 40-45%, afib/flutter, sick sinus syndrome s/p pacemaker, DVT, AAA s/p repair, CKD stage IV, HTN, HLD, COPD on home oxygen, obesity, and orthostatic hypotension admitted to [facility] on 04/21/2017 with weakness and dizziness. He has had recent admits for orthostatic hypotension and started on florinef and hydrocortisone. Discharged from rehab facility but continued to feel weak and dizzy. In the ED his  blood pressure was positive for orthostatic hypotension. CT head negative for acute findings. Unable to obtain MRI due to pacemaker. Patient with greater than 70% carotid artery occlusion. Carotid angiogram done 8/28 but not a surgical candidate. Also followed by nephrology for CKD stage IV. Palliative medicine NP Ihor Dow met with Patient 's wife Diane and she has chosen to taken her husband home with the support of Hospice services. Writer met in the family room with Diane at her request, as patient was sleeping. Education initiated regarding hospice serivces, philosophy and team approach to care with good understanding voiced. At this time Shauna Hugh has declined any DME. Patient has oxygen already in place in the home. She does feel he will require EMS transport due to weakness and inability to go up the steps at their home. CMRN Hassan Rowan aware. Signed DNR in place in patient's chart. Patient information faxed to referral. Plan is for discharge home tomorrow. Hospital care team aware. Will continue to follow through final disposition. Thank you. Flo Shanks RN, BSN, [facility] [place], hospital Liaison [phone] c

## 2017-04-25 NOTE — Discharge Instructions (Addendum)
Regular diet Activity as tolerated HOSPICE SERVICES TO FOLLOW AT HOME    Information on my medicine - ELIQUIS (apixaban)  Why was Eliquis prescribed for you? Eliquis was prescribed for you to reduce the risk of a blood clot forming that can cause a stroke if you have a medical condition called atrial fibrillation (a type of irregular heartbeat).  What do You need to know about Eliquis ? Take your Eliquis TWICE DAILY - one tablet in the morning and one tablet in the evening with or without food. If you have difficulty swallowing the tablet whole please discuss with your pharmacist how to take the medication safely.  Take Eliquis exactly as prescribed by your doctor and DO NOT stop taking Eliquis without talking to the doctor who prescribed the medication.  Stopping may increase your risk of developing a stroke.  Refill your prescription before you run out.  After discharge, you should have regular check-up appointments with your healthcare provider that is prescribing your Eliquis.  In the future your dose may need to be changed if your kidney function or weight changes by a significant amount or as you get older.  What do you do if you miss a dose? If you miss a dose, take it as soon as you remember on the same day and resume taking twice daily.  Do not take more than one dose of ELIQUIS at the same time to make up a missed dose.  Important Safety Information A possible side effect of Eliquis is bleeding. You should call your healthcare provider right away if you experience any of the following: ? Bleeding from an injury or your nose that does not stop. ? Unusual colored urine (red or dark brown) or unusual colored stools (red or black). ? Unusual bruising for unknown reasons. ? A serious fall or if you hit your head (even if there is no bleeding).  Some medicines may interact with Eliquis and might increase your risk of bleeding or clotting while on Eliquis. To help avoid  this, consult your healthcare provider or pharmacist prior to using any new prescription or non-prescription medications, including herbals, vitamins, non-steroidal anti-inflammatory drugs (NSAIDs) and supplements.  This website has more information on Eliquis (apixaban): http://[email or web address].com/eliquis/home

## 2017-04-25 NOTE — Consult Note (Signed)
ANTICOAGULATION CONSULT NOTE - Initial Consult  Pharmacy Consult for heparin drip Indication: atrial fibrillation  Allergies  Allergen Reactions  . Shellfish Allergy Anaphylaxis    Patient Measurements: Height: 6' (182.9 cm) Weight: 251 lb (113.9 kg) IBW/kg (Calculated) : 77.6 Heparin Dosing Weight: 102.3kg  Vital Signs: Temp: 98.2 F (36.8 C) (08/29 0504) Temp Source: Oral (08/29 0504) BP: 149/68 (08/29 0504) Pulse Rate: 60 (08/29 0504)  Labs:  Recent Labs  04/23/17 0354  CREATININE 2.36*  TROPONINI 0.03*    Estimated Creatinine Clearance: 33.6 mL/min (A) (by C-G formula based on SCr of 2.36 mg/dL (H)).   Medical History: Past Medical History:  Diagnosis Date  . AAA (abdominal aortic aneurysm)repaired    a. 12/2006 s/p repair of 6cm infrarenal AAA with 61mm Dacron graft.  . Anemia   . Anginal pain ([place])   . Atrial fibrillation/Flutter    a. Dx 2014;  b. 10/2012 s/p TEE/DCCV;  c. On amio and Eliquis (CHA2DS2VASc = 6).  . Basal cell carcinoma    a. 2013 x 2 back and left neck  . BPH (benign prostatic hypertrophy)   . Carotid artery occlusion    a. 11/2008 s/p R CEA; b. 01/2017 Carotid U/S: RICA <27%, LICA >03%.  . Chronic systolic heart failure ([place])    a. 02/2012 Echo: EF 30-35%, Pulm HTN, and modest RV dysfunction;  b. 10/2012 TEE: EF 25-30%, mild conc LVH, diff HK, mod RV dysfxn, mod dil RA/LA, mild MR, no MS, nl MVR, mod TR, Ao sclerosis; c. 01/2017 Echo: EF 45-50%, diff HK, triv MR, mod dil LA.  . CKD (chronic kidney disease), stage IV ([place])   . Coronary artery disease    a. 1999 PCI RCA;  b. 01/2005 NSTEMI: CABG x 3 (LIMA->LAD, VG->OM, VG->LPL) @ time of MVR.  . Diabetes mellitus, insulin dependent (IDDM), controlled ([place])   . DVT (deep venous thrombosis) ([place])   . GERD (gastroesophageal reflux disease)   . Heart disease   . History of hiatal hernia   . History of shingles   . Hyperlipidemia   . Hyperlipidemia, unspecified   . Hypertension   . Hypertensive  heart disease   . Mitral regurgitation--s/p repair    a. 01/2005 MVR: 28-mm Edwards ET Logix ring annuloplasty; b. 10/2012 TEE: normal fxning MV ring with mild MR, no MS; 01/2017 Echo: Triv MR.  . Mixed Ischemic and Non-ischemic Cardiomyopathy    a. 02/2012 Echo: EF 30-35%, Pulm HTN, and modest RV dysfunction;  b. 10/2012 TEE: EF 25-30%, mild conc LVH, diff HK, mod RV dysfxn, mod dil RA/LA, mild MR, no MS, nl MVR, mod TR, Ao sclerosis; c. 01/2017 Echo: EF 45-50%.  . Myocardial infarction ([place])    2006  . Neuropathy    SOME  . Obesity   . Onychocryptosis   . Onychomycosis   . Orthopnea   . Orthostatic hypotension    a. 11/2016 -> meds reduced;  b. 01/2017 admitted w/ presyncope and orthostasis->Meds held.  . Oxygen deficit    a. Wears 2 lpm most of the day and @ HS.  . Paresthesias    in the feet  . Paronychia of third toe, right    contusion with paronychia right third toe  . Presence of permanent cardiac pacemaker   . PVC (premature ventricular contraction)    a. 02/2009 s/p RFCA ([place]).  . Sick sinus syndrome ([place])    a. 01/2005 s/p PPM 2/2 heart block following CABG;  b. 05/2012 Gen Change:  MDT Adapta ADDRL1 DC PPM, ser # [id number].  . Sleep apnea    a. Intolerant of CPAP  . Type II diabetes mellitus (H[facility])     Medications:  Scheduled:  . amiodarone  200 mg Oral Daily  . aspirin EC  81 mg Oral Daily  . calcitRIOL  0.5 mcg Oral Q breakfast  . carvedilol  3.125 mg Oral BID WC  . docusate sodium  100 mg Oral BID  . feeding supplement (PRO-STAT SUGAR FREE 64)  30 mL Oral BID BM  . finasteride  5 mg Oral PC supper  . fludrocortisone  0.1 mg Oral Daily  . heparin  5,100 Units Intravenous Once  . hydrocortisone  10 mg Oral BID  . insulin aspart  0-5 Units Subcutaneous QHS  . insulin aspart  0-9 Units Subcutaneous TID WC  . insulin aspart protamine- aspart  5 Units Subcutaneous BID WC  . [START ON 04/26/2017] lisinopril  2.5 mg Oral Daily  . mouth rinse  15 mL Mouth Rinse BID  .  Melatonin  2.5 mg Oral QHS  . pantoprazole  40 mg Oral QAC breakfast  . rosuvastatin  20 mg Oral QHS  . sodium chloride flush  3 mL Intravenous Q12H  . triamcinolone  2 spray Nasal Daily    Assessment: Patient is a 81 year old male w/ a PMH of afib, of apixaban PTA. Pt apixaban is currently on hold due to several invasive procedures. Pt has a recent hx of stroke. He is to go for cardiac cath in a few days after a washout period of contrast from yesterdays cerebral angiography. Pharmacy is consulted to dose heparin drip-bridging until his apixaban can be restarted. Will order a stat HL. Pt last dose of apixaban was 8/27. If baseline is elevated, will need to dose off of APTT until they corollate.   Goal of Therapy:  Heparin level 0.3-0.7 units/ml Monitor platelets by anticoagulation protocol: Yes  APTT 66-102   Plan:  Give 5100 units bolus x 1 Start heparin infusion at 1400 units/hr Check anti-Xa level in 8 hours and daily while on heparin Continue to monitor H&H and platelets  Melissa D Maccia, Pharm.D, BCPS Clinical Pharmacist  04/25/2017,9:14 AM

## 2017-04-25 NOTE — Care Management Note (Signed)
Case Management Note  Patient Details  Name: Marco Wix Sr. MRN: [id number] Date of Birth: [birth year]  Subjective/Objective:  Admitted to [facility] with the diagnosis of dizziness. Lives with wife, Shauna Hugh [phone] or [phone]). Seen Dr. Rosanna Randy 03/06/17. Prescriptions are filled at [facility] in [place]. Followed by [facility] in the past. Open to [facility] at present. [facility] in the past. Released from [place] last Tuesday. Home oxygen per [facility] 2.5 liters per nasal cannula continuous. Rolling walker, grab bars, walk in shower and transport chair in the home.    Self feeds, needs help with baths and dressing.  Followed by [facility] in the past. Carotid Angiogram 04/24/17 Palliative consult in progress.               Action/Plan: Physical therapy evaluation completed. Recommending skilled nursing facility. Wife did want to take him home with [facility] back in place. Unsure of discharge disposition at this time.  Personal Care Services list given to wife.   Expected Discharge Date:                  Expected Discharge Plan:     In-House Referral:     Discharge planning Services     Post Acute Care Choice:    Choice offered to:     DME Arranged:    DME Agency:     HH Arranged:    HH Agency:     Status of Service:     If discussed at [employer], dates discussed:    Additional Comments:  Shelbie Ammons, [place] [facility] [phone] 04/25/2017, 10:38 AM

## 2017-04-25 NOTE — Progress Notes (Signed)
PT Cancellation Note  Patient Details Name: Marco [name]. MRN: [id number] DOB: March 31, 1936   Cancelled Treatment:    Reason Eval/Treat Not Completed: Patient declined, no reason specified. Treatment attempted, however upon arrival, pt soundly sleeping. Pt awakens to tactile touch, however lethargic. Pt reports he does not want to participate in therapy as he is too tired. Will re-attempt, next available date.   Xzaria Teo 04/25/2017, 4:14 PM  Greggory Stallion, PT, DPT [phone]

## 2017-04-25 NOTE — Progress Notes (Signed)
[facility]  ROUNDING NOTE   Subjective:  Patient seen at bedside. We follow him for outpatient management of chronic kidney disease stage IV. This admission he was found to have an acute CVA with right lower extremity weakness. He also had left carotid stenosis greater than 70%. In addition he had an abnormal stress test.   Objective:  Vital signs in last 24 hours:  Temp:  [98 F (36.7 C)-98.8 F (37.1 C)] 98.2 F (36.8 C) (08/29 0504) Pulse Rate:  [60-81] 60 (08/29 0504) Resp:  [12-25] 20 (08/29 0504) BP: (137-184)/(68-94) 149/68 (08/29 0504) SpO2:  [90 %-100 %] 100 % (08/29 0504) Weight:  [113.9 kg (251 lb)-114.8 kg (253 lb)] 113.9 kg (251 lb) (08/29 0500)  Weight change: -0.227 kg (-8 oz) Filed Weights   04/24/17 0551 04/24/17 1223 04/25/17 0500  Weight: 115 kg (253 lb 8 oz) 114.8 kg (253 lb) 113.9 kg (251 lb)    Intake/Output: No intake/output data recorded.   Intake/Output this shift:  No intake/output data recorded.  Physical Exam: General: No acute distress, obese male  Head: Normocephalic, atraumatic. Moist oral mucosal membranes  Eyes: Anicteric  Neck: Supple, trachea midline  Lungs:  Clear to auscultation, normal effort  Heart: S1S2 no rubs  Abdomen:  Soft, nontender, bowel sounds present  Extremities: trace peripheral edema.  Neurologic: Awake, alert, following commands, RLE weakness noted  Skin: No lesions       Basic Metabolic Panel:  Recent Labs Lab 04/21/17 0117 04/22/17 0417 04/23/17 0354  NA 148* 148* 144  K 3.6 4.3 4.2  CL 104 108 105  CO2 35* 35* 34*  GLUCOSE 83 111* 126*  BUN 36* 36* 43*  CREATININE 2.48* 2.42* 2.36*  CALCIUM 9.4 9.3 9.1    Liver Function Tests: No results for input(s): AST, ALT, ALKPHOS, BILITOT, PROT, ALBUMIN in the last 168 hours. No results for input(s): LIPASE, AMYLASE in the last 168 hours. No results for input(s): AMMONIA in the last 168 hours.  CBC:  Recent Labs Lab 04/21/17 0117  04/22/17 0417  WBC 7.1 5.7  HGB 11.2* 10.9*  HCT 34.3* 33.6*  MCV 95.7 97.3  PLT 156 142*    Cardiac Enzymes:  Recent Labs Lab 04/21/17 0117 04/21/17 0444 04/23/17 0354  TROPONINI 0.04* 0.04* 0.03*    BNP: Invalid input(s): POCBNP  CBG:  Recent Labs Lab 04/24/17 1145 04/24/17 1650 04/24/17 1836 04/24/17 2050 04/25/17 0851  GLUCAP 104* 92 181* 260* 158*    Microbiology: Results for orders placed or performed during the hospital encounter of 04/21/17  Surgical pcr screen     Status: None   Collection Time: 04/23/17  9:01 PM  Result Value Ref Range Status   MRSA, PCR NEGATIVE NEGATIVE Final   Staphylococcus aureus NEGATIVE NEGATIVE Final    Comment:        The Xpert SA Assay (FDA approved for NASAL specimens in patients over 56 years of age), is one component of a comprehensive surveillance program.  Test performance has been validated by [facility] for patients greater than or equal to 20 year old. It is not intended to diagnose infection nor to guide or monitor treatment.     Coagulation Studies: No results for input(s): LABPROT, INR in the last 72 hours.  Urinalysis: No results for input(s): COLORURINE, LABSPEC, PHURINE, GLUCOSEU, HGBUR, BILIRUBINUR, KETONESUR, PROTEINUR, UROBILINOGEN, NITRITE, LEUKOCYTESUR in the last 72 hours.  Invalid input(s): APPERANCEUR    Imaging: Nm Myocar Multi W/spect W/wall Motion / Ef  Result Date: 04/24/2017  Abnormal, potenially high risk study (based predominantly on low LVEF).  There is a moderate in size, moderate in severity, partially reversible defect involving the inferolateral wall consistent with ischemia and scar.  Dilated left ventricle with moderately to severely reduced contraction (LVEF 32% by QGS).      Medications:   . sodium chloride    . heparin     . amiodarone  200 mg Oral Daily  . aspirin EC  81 mg Oral Daily  . calcitRIOL  0.5 mcg Oral Q breakfast  . carvedilol  3.125 mg Oral BID  WC  . docusate sodium  100 mg Oral BID  . feeding supplement (PRO-STAT SUGAR FREE 64)  30 mL Oral BID BM  . finasteride  5 mg Oral PC supper  . fludrocortisone  0.1 mg Oral Daily  . heparin  5,100 Units Intravenous Once  . hydrocortisone  10 mg Oral BID  . insulin aspart  0-5 Units Subcutaneous QHS  . insulin aspart  0-9 Units Subcutaneous TID WC  . insulin aspart protamine- aspart  5 Units Subcutaneous BID WC  . [START ON 04/26/2017] lisinopril  2.5 mg Oral Daily  . mouth rinse  15 mL Mouth Rinse BID  . Melatonin  2.5 mg Oral QHS  . pantoprazole  40 mg Oral QAC breakfast  . rosuvastatin  20 mg Oral QHS  . sodium chloride flush  3 mL Intravenous Q12H  . triamcinolone  2 spray Nasal Daily   sodium chloride, acetaminophen **OR** acetaminophen, albuterol, diphenhydrAMINE, ondansetron **OR** ondansetron (ZOFRAN) IV, polyethylene glycol, sodium chloride flush  Assessment/ Plan:  81 y.o. male with hypertension, diabetes mellitus type II, hyperlipidemia, chronic diastolic heart failure, coronary disease status post CABG in 2006, abdominal aortic aneurysm repair, and permanent pacemaker placement, CKD stage IV, anemia of CKD, SHPTH, orthostatic hypotension with supine hypertension here with acute CVA.   1. Chronic kidney disease stage IV/proteinuria/diabetes mellitus type 2 with CKD. The patient's chronic kidney disease at the moment appears to be stable. He did receive IV contrast recently. We will need to monitor kidney function closely for now. No acute indication for dialysis.  2.  Orthostatic hypotension. This has been difficult to treat. In the supine position the patient is hypertensive. However becomes quite dizzy upon standing.  Patient on fludrocortisone at the moment.  3. Anemia of chronic kidney disease. Hemoglobin currently 10.9.  No indication for procrit.     LOS: 2 Rj Pedrosa 8/29/20189:52 AM

## 2017-04-25 NOTE — Progress Notes (Signed)
[place] [place] NAME: Marco [name]    MR#:  [id number]  DATE OF BIRTH:  1936-08-24  SUBJECTIVE:  CHIEF COMPLAINT:   Chief Complaint  Patient presents with  . Dizziness   Patient is drowsy today. Opens his eyes on calling his name. Goes back to sleep easily.  REVIEW OF SYSTEMS:    Review of Systems  Constitutional: Positive for malaise/fatigue. Negative for chills and fever.  HENT: Negative for sore throat.   Eyes: Negative for blurred vision, double vision and pain.  Respiratory: Negative for cough, hemoptysis, shortness of breath and wheezing.   Cardiovascular: Negative for chest pain, palpitations, orthopnea and leg swelling.  Gastrointestinal: Negative for abdominal pain, constipation, diarrhea, heartburn, nausea and vomiting.  Genitourinary: Negative for dysuria and hematuria.  Musculoskeletal: Negative for back pain and joint pain.  Skin: Negative for rash.  Neurological: Positive for dizziness, focal weakness and weakness. Negative for sensory change, speech change and headaches.  Endo/Heme/Allergies: Does not bruise/bleed easily.  Psychiatric/Behavioral: Negative for depression. The patient is not nervous/anxious.    DRUG ALLERGIES:   Allergies  Allergen Reactions  . Shellfish Allergy Anaphylaxis    VITALS:  Blood pressure (!) 149/68, pulse 60, temperature 98.2 F (36.8 C), temperature source Oral, resp. rate 20, height 6' (1.829 m), weight 113.9 kg (251 lb), SpO2 100 %.  PHYSICAL EXAMINATION:   Physical Exam  GENERAL:  81 y.o.-year-old patient lying in the bed with no acute distress. EYES: Pupils equal, round, reactive to light and accommodation. No scleral icterus. Extraocular muscles intact. HEENT: Head atraumatic, normocephalic. Oropharynx and nasopharynx clear. NECK:  Supple, no jugular venous distention. No thyroid enlargement, no tenderness.  LUNGS: Normal breath sounds bilaterally, no wheezing, rales,  rhonchi. No use of accessory muscles of respiration.  CARDIOVASCULAR: S1, S2 normal. No murmurs, rubs, or gallops.  ABDOMEN: Soft, nontender, nondistended. Bowel sounds present. No organomegaly or mass. EXTREMITIES: No cyanosis, clubbing or edema b/l.    NEUROLOGIC: Cranial nerves II through XII are intact.  Right lower extremity weakness PSYCHIATRIC: The patient is drowsy SKIN: No obvious rash, lesion, or ulcer.   LABORATORY PANEL:   CBC  Recent Labs Lab 04/25/17 1028  WBC 6.9  HGB 11.4*  HCT 35.0*  PLT 178   ------------------------------------------------------------------------------------------------------------------ Chemistries   Recent Labs Lab 04/23/17 0354  NA 144  K 4.2  CL 105  CO2 34*  GLUCOSE 126*  BUN 43*  CREATININE 2.36*  CALCIUM 9.1   ------------------------------------------------------------------------------------------------------------------  Cardiac Enzymes  Recent Labs Lab 04/23/17 0354  TROPONINI 0.03*   ------------------------------------------------------------------------------------------------------------------  RADIOLOGY:  Nm Myocar Multi W/spect W/wall Motion / Ef  Result Date: 04/24/2017  Abnormal, potenially high risk study (based predominantly on low LVEF).  There is a moderate in size, moderate in severity, partially reversible defect involving the inferolateral wall consistent with ischemia and scar.  Dilated left ventricle with moderately to severely reduced contraction (LVEF 32% by QGS).      ASSESSMENT AND PLAN:   * Acute CVA with RLE weakness  CT scan of the head shows nothing acute other than old strokes. Cannot get an MRI due to permanent pacemaker. CTH repeated and no change. He does have greater than 70% carotid artery occlusion. Recent echocardiogram reviewed and shows no thrombus. Does have risk factor of atrial fibrillation and is on Eliquis.  * Left carotid stenosis >70% Carotid angiogram done  04/24/2017. Initially carotid surgery planned. But patient has no improvement from his stroke. Presently plan  is to go home with hospice in a wide all aggressive procedures.  * Chest pain Resolved Stress test done. Discussed with Dr. Rockey Situ. No cardiac catheterization planned.  * Chronic dizziness. Continue to monitor. Orthostatics positive. Bolused IV fluid yesterday  * Insulin dependent diabetes mellitus. Continue home dose. Sliding scale insulin added. Diabetic diet.  * pAfib Restart Eliquis  * Uncontrolled hypertension. Patient's blood pressure. medications were stopped due to having worsening. orthostatic hypotension. started low dose lisinopril at 5 mg daily. Add low-dose Coreg today  * Chronic systolic congestive heart failure. No signs of fluid overload  * CKD stage IV.  Received contrast for carotid angiogram yesterday. Monitor creatinine.  * DVT prophylaxis. On Eliquis.   Discussed with Dr. Melton Alar with palliative care, Santiago Glad with hospice. Discussed in detail with patient's wife regarding home with hospice, discharge plan.  CODE STATUS: DNR  DVT Prophylaxis: SCDs  TOTAL TIME TAKING CARE OF THIS PATIENT: 35 minutes.   Hillary Bow R M.D on 04/25/2017 at 1:00 PM  Between 7am to 6pm - Pager - [phone]  After 6pm go to [email or web address] - password [email or web address] [facility]  Office  [phone]  CC: Primary care physician; [name], MD  Note: This dictation was prepared with Dragon dictation along with smaller phrase technology. Any transcriptional errors that result from this process are unintentional.

## 2017-04-25 NOTE — Telephone Encounter (Signed)
Hospice is faxing a request for Hospice Services when the patient discharges from the hospital tomorrow.  They said they could take a verbal or he can fax the request back at [phone]  Call back is [phone]  Thanks Con Memos

## 2017-04-25 NOTE — Telephone Encounter (Signed)
Please review-aa 

## 2017-04-25 NOTE — Progress Notes (Signed)
Patient Name: Philander Ake [facility] Sr. Date of Encounter: 04/25/2017  Primary Cardiologist: [facility] Problem List     Active Problems:   Dizziness   Acute CVA (cerebrovascular accident) ([place])     Subjective   No chest pain or SOB. Lexiscan Myoview on 8/28 read as moderate to high risk based on EF. Images show mild reversibility of the apex. Underwent carotid angiogram on 8/28. No bmet this morning.    Inpatient Medications    Scheduled Meds: . amiodarone  200 mg Oral Daily  . aspirin EC  81 mg Oral Daily  . calcitRIOL  0.5 mcg Oral Q breakfast  . carvedilol  3.125 mg Oral BID WC  . docusate sodium  100 mg Oral BID  . feeding supplement (PRO-STAT SUGAR FREE 64)  30 mL Oral BID BM  . finasteride  5 mg Oral PC supper  . fludrocortisone  0.1 mg Oral Daily  . heparin  5,100 Units Intravenous Once  . hydrocortisone  10 mg Oral BID  . insulin aspart  0-5 Units Subcutaneous QHS  . insulin aspart  0-9 Units Subcutaneous TID WC  . insulin aspart protamine- aspart  5 Units Subcutaneous BID WC  . [START ON 04/26/2017] lisinopril  2.5 mg Oral Daily  . mouth rinse  15 mL Mouth Rinse BID  . Melatonin  2.5 mg Oral QHS  . pantoprazole  40 mg Oral QAC breakfast  . rosuvastatin  20 mg Oral QHS  . sodium chloride flush  3 mL Intravenous Q12H  . triamcinolone  2 spray Nasal Daily   Continuous Infusions: . sodium chloride    . heparin     PRN Meds: sodium chloride, acetaminophen **OR** acetaminophen, albuterol, diphenhydrAMINE, ondansetron **OR** ondansetron (ZOFRAN) IV, polyethylene glycol, sodium chloride flush   Vital Signs    Vitals:   04/24/17 1825 04/24/17 2049 04/25/17 0500 04/25/17 0504  BP: (!) 143/76 137/75  (!) 149/68  Pulse: 81 75  60  Resp:  20  20  Temp:  98.6 F (37 C)  98.2 F (36.8 C)  TempSrc:  Oral  Oral  SpO2:  100%  100%  Weight:   251 lb (113.9 kg)   Height:       No intake or output data in the 24 hours ending 04/25/17 0944 Filed Weights     04/24/17 0551 04/24/17 1223 04/25/17 0500  Weight: 253 lb 8 oz (115 kg) 253 lb (114.8 kg) 251 lb (113.9 kg)    Physical Exam    GEN: Frail appearing, in no acute distress.  HEENT: Grossly normal.  Neck: Supple, no JVD, carotid bruits, or masses. Cardiac: RRR, no murmurs, rubs, or gallops. No clubbing, cyanosis, edema.  Radials/DP/PT 2+ and equal bilaterally.  Respiratory:  Diminished breath sounds bilaterally. GI: Soft, nontender, nondistended, BS + x 4. MS: no deformity or atrophy. Skin: warm and dry, no rash. Neuro:  Strength and sensation are intact. Psych: AAOx3.  Normal affect.  Labs    CBC No results for input(s): WBC, NEUTROABS, HGB, HCT, MCV, PLT in the last 72 hours. Basic Metabolic Panel  Recent Labs  04/23/17 0354  NA 144  K 4.2  CL 105  CO2 34*  GLUCOSE 126*  BUN 43*  CREATININE 2.36*  CALCIUM 9.1   Liver Function Tests No results for input(s): AST, ALT, ALKPHOS, BILITOT, PROT, ALBUMIN in the last 72 hours. No results for input(s): LIPASE, AMYLASE in the last 72 hours. Cardiac Enzymes  Recent Labs  04/23/17 0354  TROPONINI 0.03*   BNP Invalid input(s): POCBNP D-Dimer No results for input(s): DDIMER in the last 72 hours. Hemoglobin A1C No results for input(s): HGBA1C in the last 72 hours. Fasting Lipid Panel No results for input(s): CHOL, HDL, LDLCALC, TRIG, CHOLHDL, LDLDIRECT in the last 72 hours. Thyroid Function Tests No results for input(s): TSH, T4TOTAL, T3FREE, THYROIDAB in the last 72 hours.  Invalid input(s): FREET3  Telemetry    Paced - Personally Reviewed  ECG    n/a - Personally Reviewed  Radiology    Nm Myocar Multi W/spect W/wall Motion / Ef  Result Date: 04/24/2017  Abnormal, potenially high risk study (based predominantly on low LVEF).  There is a moderate in size, moderate in severity, partially reversible defect involving the inferolateral wall consistent with ischemia and scar.  Dilated left ventricle with  moderately to severely reduced contraction (LVEF 32% by QGS).     Cardiac Studies   As above  Patient Profile     81 y.o. 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 Admitted for possible stroke.   Assessment & Plan    1. Chest pain/elevated troponin: -Currently, chest pain free -Had a brief episode of chest pain on 8/25 rated 5/10 that improved when EMS got him up -Has not had any chest pain since -Troponin minimally elevated at 0.04 x 2, cycle this morning -Possibly supply demand ischemia in the setting of CKD stage IV, anemia of chronic disease, and orthostatic hypotension  -Recent echo as above, no need to repeat -Per Dr. Rockey Situ, no plans on cardiac catheterization based on images of stress testing given patient is asymptomatic and minimal apical periinfarct ischemia noted. Reduced EF is known -Continue ASA  2. CAD/CABG: -As above  3. Afib/flutter: -Currently A paced -Continue Eliquis (on dual therapy) -Not on rate controlling medications given issues with orthostasis -Amiodarone  4. Dizziness/weakness/orthostatic hypotension/possible stroke: -Noted to be orthostatic on 8/25 with drop in SBP from 176-->152 -S/p IV fluids -Recommend rechecking orthostatic vital signs this morning -Restarted on [place] this admission by IM -On Cortisol  -Abdominal binder, still has not received this (will ask case manager to look into this) -Does not appear he is taking Sudafed any longer  5. Carotid artery disease: -Carotid  ultrasound with 30% LICA stenosis -Per vascular surgery  -Would be high risk of any non-cardiac procedure given stress test and PMH -As above -Crestor  6. MVR: -Recent echo with stable valvular heart disease as above  7. CKD stage IV: -Stable -Monitor -Check bmet  Signed, Marcille Blanco [place] Pager: [phone] 04/25/2017, 9:44 AM   Attending Note Patient seen and examined, agree with detailed note above,  Patient presentation and plan discussed on rounds.   Stress test images reviewed by myself Known cardiomyopathy, ejection fraction 35% No significant reversible ischemia, very mild in the apical region Denies chest pain or symptoms concerning for angina  Rapid decline over the past 6 months. Seen in clinic Frequent falls at home, frequent readmissions to rehabilitation Unable to take care of himself at home,  wife unable to get him off the ground Frequent calls to EMTs to get him off the ground a huge financial expense  On physical exam he is in bed, requiring 2 person assist to get to the bedside commode Lungs clear to auscultation, heart sounds regular with normal S1-S2 no murmurs appreciated, no JVD, obese, no significant lower extremity edema  Lab work reviewed none from today Previous creatinine 2.36  A/P ----Debilitated state Long discussion with patient's wife No known him from clinic, rapid decline Unable to stand without 2 person assistance. Frequent falls at home. High risk of injury if he goes back home given frequent falls requiring frequent calls to fire department and EMTs to get off the floor Would recommend skilled nursing facility with palliative consult  ----Chest pain/elevated troponin: chest pain free I reviewed stress test, given no significant reversible ischemia, fixed perfusion defects, very small region of apical reversible defect,  known cardiomyopathy ejection fraction 30-35%, would not proceed with cardiac catheterization  at this time Especially in light of underlying renal dysfunction  ---CAD/CABG: Management as above, medical management at this time  ---Afib/flutter: -Currently A paced -Continue Eliquis and aspirin given stroke -Not on rate controlling medications given issues with orthostasis -Amiodarone  ---Dizziness/weakness/orthostatic hypotension/possible stroke: -Noted to be orthostatic on 8/25 with drop in SBP from 176-->152 Long history of orthostasis -On Cortisol  -Abdominal binder, Consider midodrine if symptomatic  5. Carotid artery disease: -Carotid ultrasound with 80% LICA stenosis -Per vascular surgery  Would probably recommend medical management at this time given debilitated state, consider outpatient follow-up with vascular surgery No cardiac catheterization planned  6. MVR: -Recent echo with stable valvular heart disease as above  7. CKD stage IV: -Stable  Greater than 50% was spent in counseling and coordination of care with patient Total encounter time 35 minutes or more   Signed: Esmond Plants  M.D., Ph.D. [facility]

## 2017-04-25 NOTE — Care Management (Signed)
Palliative Care referral in progress. Recommended Hospice services in the home. Discussed Hospice Agencies. [place]. Flo Shanks, RN representative updated. May need EMS for transportation home. Shelbie Ammons RN MSN CCM Care Management [phone]

## 2017-04-25 NOTE — Consult Note (Signed)
Consultation Note Date: 04/25/2017   Patient Name: Marco Amores Sr.  DOB: Jun 09, 1936  MRN: [id number]  Age / Sex: 81 y.o., male  PCP: [name], MD Referring Physician: Hillary Bow, MD  Reason for Consultation: Establishing goals of care  HPI/Patient Profile: 81 y.o. male  with past medical history of CAD s/p CABG, chronic combined systolic and diastolic heart failure with EF 40-45%, afib/flutter, sick sinus syndrome s/p pacemaker, DVT, AAA s/p repair, CKD stage IV, HTN, HLD, COPD on home oxygen, obesity, and orthostatic hypotension admitted on 04/21/2017 with weakness and dizziness. Per wife, weaker on right leg. He has had recent admits for orthostatic hypotension and started on florinef and hydrocortisone. Discharged from rehab facility but continued to feel weak and dizzy. In ED, blood pressure positive for orthostatic hypotension. CT head negative for acute findings. Unable to obtain MRI due to pacemaker. Cardiology and vascular surgery following. Patient with greater than 70% carotid artery occlusion. Carotid angiogram done 8/28 but not a surgical candidate. Stress test performed. Deferring cardiac cath. Also followed by nephrology for CKD stage IV. Palliative medicine consultation for goals of care.   Clinical Assessment and Goals of Care: I have reviewed medical records, discussed with care team, and met with wife Marco [name]) in family waiting area to discuss diagnosis, prognosis, GOC, EOL wishes, disposition and options. Patient wakes to voice, answers questions, and follows commands. Appears very weak. Tells me he is "tired" from nurses assisting with a bath. Wife requested we speak outside of room.   Introduced Palliative Medicine as specialized medical care for people living with serious illness. It focuses on providing relief from the symptoms and stress of a serious illness. The goal  is to improve quality of life for both the patient and the family.  We discussed a brief life review of the patient. Married to Marco [name] for 10 years. This is their second marriage. He has one son who lives out of town and not involved in his care. 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 Enjoyed golfing, fishing, and working on cars before his health declined. Wife recalls many heart complications since early 2000's. Since May, his health has declined with increased weakness, falls, and orthostatic hypotension. He has been in and out of the hospital and rehab twice with no improvement. Marco [name] speaks of the challenges with being his primary caregiver. She has four children but they are unavailable to help on a regular basis.   Discussed hospital diagnoses, interventions, and multiple underlying co-morbidities. Marco [name] has a good understanding of progressive heart failure with carotid occlusion and underlying lung and kidney concerns. She understands he is high risk for invasive procedures and guarded prognosis with continued functional decline.   Advanced directives, concepts specific to code status, artifical feeding and hydration, and rehospitalization were considered and discussed. The patient has spoken of his wishes for NO resuscitation or life support to prolong his life. Wife confirms DNR/DNI. No feeding tube. He has documented living will and she is HCPOA. Encouraged her to bring documentation to  hospital to be scanned.   I attempted to elicit values and goals of care important to the patient and wife. She was hopeful for improvement from rehab but he has continued to decline with weakness and dizziness every time he stands up. Marco [name] does not think continued physical therapy/rehab will benefit him. "He is tired." Explained my concern with continued functional status decline with heart failure, carotid occlusion, continued dizziness, and risk for another stroke/decompensation.  The difference between  aggressive medical interventions and a comfort approach was considered. Discussed his eligibility for hospice services with focus on comfort, quality, and dignity at the EOL. Educated on comfort focus involving symptom management and preventing re-hospitalization. Educated on hospice options. Marco [name] is worried about taking him home but cannot afford out-of-pocket expense of nursing facility. We discussed hospice services along with hiring private caregivers a few hours a day. Also, transition to hospice facility when appropriate if she can no longer care for him and prognosis is 2-3 weeks. [name] agrees with this plan.  Questions and concerns were addressed. Marco [name] shares many stories of Marco [name]. Therapeutic listening and emotional/spiritual support provided.     SUMMARY OF RECOMMENDATIONS    Wife confirms DNR/DNI.   Patient has documented living will and HCPOA (wife). Encouraged wife to bring to hospital tomorrow to be copied.   Continue current interventions while hospitalized. Comfort approach on discharge. Wife interested in home hospice services. RN CM notified.   PMT will continue to support patient/family through hospitalization.   Code Status/Advance Care Planning:  DNR  Symptom Management:   Per attending  Palliative Prophylaxis:   Aspiration, Delirium Protocol, Frequent Pain Assessment, Oral Care and Turn Reposition  Additional Recommendations (Limitations, Scope, Preferences):  DNR/DNI  Psycho-social/Spiritual:   Desire for further Chaplaincy support:no  Additional Recommendations: Caregiving  Support/Resources and Education on Hospice  Prognosis:   < 6 months: if not significantly less with combined heart failure, 70% carotid artery occlusion and high risk for surgical intervention, COPD, CKD IV, and severe functional status decline.   Discharge Planning: Home with Hospice      Primary Diagnoses: Present on Admission: . Acute CVA (cerebrovascular  accident) ([place])   I have reviewed the medical record, interviewed the patient and family, and examined the patient. The following aspects are pertinent.  Past Medical History:  Diagnosis Date  . AAA (abdominal aortic aneurysm)repaired    a. 12/2006 s/p repair of 6cm infrarenal AAA with 17m Dacron graft.  . Anemia   . Anginal pain ([place]   . Atrial fibrillation/Flutter    a. Dx 2014;  b. 10/2012 s/p TEE/DCCV;  c. On amio and Eliquis (CHA2DS2VASc = 6).  . Basal cell carcinoma    a. 2013 x 2 back and left neck  . BPH (benign prostatic hypertrophy)   . Carotid artery occlusion    a. 11/2008 s/p R CEA; b. 01/2017 Carotid U/S: RICA <<17% LICA >>40%  . Chronic systolic heart failure ([place]    a. 02/2012 Echo: EF 30-35%, Pulm HTN, and modest RV dysfunction;  b. 10/2012 TEE: EF 25-30%, mild conc LVH, diff HK, mod RV dysfxn, mod dil RA/LA, mild MR, no MS, nl MVR, mod TR, Ao sclerosis; c. 01/2017 Echo: EF 45-50%, diff HK, triv MR, mod dil LA.  . CKD (chronic kidney disease), stage IV ([place]   . Coronary artery disease    a. 1999 PCI RCA;  b. 01/2005 NSTEMI: CABG x 3 (LIMA->LAD, VG->OM, VG->LPL) @ time of MVR.  . Diabetes mellitus, insulin  dependent (IDDM), controlled ([place])   . DVT (deep venous thrombosis) ([place])   . GERD (gastroesophageal reflux disease)   . Heart disease   . History of hiatal hernia   . History of shingles   . Hyperlipidemia   . Hyperlipidemia, unspecified   . Hypertension   . Hypertensive heart disease   . Mitral regurgitation--s/p repair    a. 01/2005 MVR: 28-mm Edwards ET Logix ring annuloplasty; b. 10/2012 TEE: normal fxning MV ring with mild MR, no MS; 01/2017 Echo: Triv MR.  . Mixed Ischemic and Non-ischemic Cardiomyopathy    a. 02/2012 Echo: EF 30-35%, Pulm HTN, and modest RV dysfunction;  b. 10/2012 TEE: EF 25-30%, mild conc LVH, diff HK, mod RV dysfxn, mod dil RA/LA, mild MR, no MS, nl MVR, mod TR, Ao sclerosis; c. 01/2017 Echo: EF 45-50%.  . Myocardial infarction ([place])    2006    . Neuropathy    SOME  . Obesity   . Onychocryptosis   . Onychomycosis   . Orthopnea   . Orthostatic hypotension    a. 11/2016 -> meds reduced;  b. 01/2017 admitted w/ presyncope and orthostasis->Meds held.  . Oxygen deficit    a. Wears 2 lpm most of the day and @ HS.  . Paresthesias    in the feet  . Paronychia of third toe, right    contusion with paronychia right third toe  . Presence of permanent cardiac pacemaker   . PVC (premature ventricular contraction)    a. 02/2009 s/p RFCA ([place]).  . Sick sinus syndrome ([place])    a. 01/2005 s/p PPM 2/2 heart block following CABG;  b. 05/2012 Gen Change: MDT Adapta ADDRL1 DC PPM, ser # [id number].  . Sleep apnea    a. Intolerant of CPAP  . Type II diabetes mellitus ([place])    Social History   Social History  . Marital status: Married    Spouse name: N/A  . Number of children: 5  . Years of education: college degree   Occupational History  . Retired     Engineer, structural in [employer]  .  Retired   Social History Main Topics  . Smoking status: Former Smoker    Packs/day: 1.50    Years: 34.00    Types: Cigarettes    Quit date: 08/28/1987  . Smokeless tobacco: Never Used  . Alcohol use 0.6 oz/week    1 Cans of beer per week     Comment: Occasionally but none since his surgery in June  . Drug use: No  . Sexual activity: No   Other Topics Concern  . None   Social History Narrative   Lives locally with wife.  Sedentary - sits most of the day.  Ambulates some around his home - uses a walker.   Family History  Problem Relation Age of Onset  . Heart attack Mother 74  . Diabetes Sister   . Hypertension Sister   . Heart attack Sister   . Heart attack Brother 81       3 Brothers deceased from [place] in 31's or 36's  . Heart disease Brother        Heart Disease before age 15  . Heart attack Father 15  . Diabetes Father        Amputation: bilateral feet  . Heart disease Father   . Heart attack Sister   . Heart attack Sister    . Heart attack Brother 28  . Heart attack Brother 43  .  Diabetes Son    Scheduled Meds: . amiodarone  200 mg Oral Daily  . apixaban  5 mg Oral BID  . aspirin EC  81 mg Oral Daily  . calcitRIOL  0.5 mcg Oral Q breakfast  . carvedilol  3.125 mg Oral BID WC  . docusate sodium  100 mg Oral BID  . feeding supplement (PRO-STAT SUGAR FREE 64)  30 mL Oral BID BM  . finasteride  5 mg Oral PC supper  . fludrocortisone  0.1 mg Oral Daily  . hydrocortisone  10 mg Oral BID  . insulin aspart  0-5 Units Subcutaneous QHS  . insulin aspart  0-9 Units Subcutaneous TID WC  . insulin aspart protamine- aspart  5 Units Subcutaneous BID WC  . [START ON 04/26/2017] lisinopril  2.5 mg Oral Daily  . mouth rinse  15 mL Mouth Rinse BID  . Melatonin  2.5 mg Oral QHS  . pantoprazole  40 mg Oral QAC breakfast  . rosuvastatin  20 mg Oral QHS  . sodium chloride flush  3 mL Intravenous Q12H  . triamcinolone  2 spray Nasal Daily   Continuous Infusions: . sodium chloride     PRN Meds:.sodium chloride, acetaminophen **OR** acetaminophen, albuterol, diphenhydrAMINE, ondansetron **OR** ondansetron (ZOFRAN) IV, polyethylene glycol, sodium chloride flush Medications Prior to Admission:  Prior to Admission medications   Medication Sig Start Date End Date Taking? Authorizing Provider  ACCU-CHEK FASTCLIX LANCETS MISC TEST twice a day if needed 07/04/16  Yes [name], MD  ACCU-CHEK SMARTVIEW test strip TEST twice a day 07/05/16  Yes [name], MD  albuterol (PROVENTIL) (2.5 MG/3ML) 0.083% nebulizer solution Take 3 mLs (2.5 mg total) by nebulization every 6 (six) hours as needed for wheezing or shortness of breath. 02/07/17  Yes Wieting, Delfino Lovett, MD  amiodarone (PACERONE) 200 MG tablet take 1 tablet by mouth once daily 01/08/17  Yes Gollan, Kathlene November, MD  BD INSULIN SYRINGE ULTRAFINE 31G X 5/16" 0.3 ML MISC use twice a day as directed 04/03/16  Yes [name], MD  calcitRIOL (ROCALTROL) 0.5  MCG capsule Take 0.5 mcg by mouth daily.   Yes [provider]  Cholecalciferol (VITAMIN D3) 2000 units capsule Take 2,000 Units by mouth daily.   Yes [provider]  collagenase (SANTYL) ointment Apply 1 application topically daily. Apply a thick layer topically to wound on left chest, cover with ns damp to dry gauze, cover with Allevyn. Change gauze daily. Change Allevyn every 3 days.   Yes [provider]  dextrose (GLUTOSE) 40 % GEL Take 1 Tube by mouth once as needed for low blood sugar. Give 1 tube every 30 minutes orally as needed until BS > 70 and pt is still responsive.   Yes [provider]  docusate sodium (COLACE) 100 MG capsule Take 100 mg by mouth 2 (two) times daily.    Yes [provider]  ELIQUIS 5 MG TABS tablet take 1 tablet by mouth twice a day 07/31/16  Yes [name], MD  finasteride (PROSCAR) 5 MG tablet Take 1 tablet (5 mg total) by mouth daily after supper. 03/20/17  Yes Vaughan Basta, MD  fludrocortisone (FLORINEF) 0.1 MG tablet Take 0.1 mg by mouth daily.   Yes [provider]  glucagon (GLUCAGON EMERGENCY) 1 MG injection Inject 1 mg into the vein once as needed. Give 1 mg for hypoglycemia (symptomatic) not responding to oral glucose, snack. May repeat x 1 after 15 minutes.  USE ONLY IF PT IS OR IS BECOMING UNRESPONSIVE.   Yes [provider]  GLUCERNA Barrie Folk) LIQD Take 1 Can by mouth 2 (two) times daily between meals.   Yes [provider]  hydrocortisone (CORTEF) 10 MG tablet Take 10 mg by mouth 2 (two) times daily.   Yes [provider]  insulin lispro protamine-lispro (HUMALOG MIX 75/25) (75-25) 100 UNIT/ML SUSP injection Inject 5 Units into the skin 2 (two) times daily with a meal. Patient taking differently: Inject 5 Units into the skin 2 (two) times daily with a meal. Taking 10 units 02/07/17  Yes Wieting, Richard, MD  Melatonin 3 MG TABS Take 1 tablet by mouth at  bedtime.   Yes [provider]  Multiple Vitamins-Minerals (MULTIVITAL) tablet Take 1 tablet by mouth daily.   Yes [provider]  omeprazole (PRILOSEC) 20 MG capsule Take 20 mg by mouth daily. Monday, Wednesday, friday    Yes [provider]  OXYGEN Inhale 3 L into the lungs continuous.    Yes [provider]  polyethylene glycol (MIRALAX / GLYCOLAX) packet Take 17 g by mouth daily as needed for mild constipation. 06/12/16  Yes Fritzi Mandes, MD  rosuvastatin (CRESTOR) 20 MG tablet take 1 tablet by mouth at bedtime 10/02/16  Yes Gollan, Kathlene November, MD  Skin Protectants, Misc. (ENDIT EX) Apply liberal amount topically to gluteal fold wound and surrounding tissue 2 times daily and as needed. Do not scrub skin. If soiled, wipe off top layer and add more cream.   Yes [provider]  triamcinolone (NASACORT ALLERGY 24HR) 55 MCG/ACT AERO nasal inhaler Place 2 sprays into the nose daily.    Yes [provider]  Zinc Oxide (BOUDREAUXS BUTT PASTE) 16 % OINT Apply liberal amount topically to areas of skin irritation 2 times daily and as needed. Ok to leave at bedside.   Yes [provider]  zolpidem (AMBIEN) 5 MG tablet Take 5 mg by mouth at bedtime as needed for sleep.   Yes [provider]  Amino Acids-Protein Hydrolys (FEEDING SUPPLEMENT, PRO-STAT SUGAR FREE 64,) LIQD Take 30 mLs by mouth 2 (two) times daily between meals.    [provider]  bacitracin 500 UNIT/GM ointment Apply 1 application topically daily. Apply to tip of 2nd toe    [provider]  lisinopril (PRINIVIL,ZESTRIL) 5 MG tablet Take 1 tablet (5 mg total) by mouth daily. 04/24/17   Hillary Bow, MD   Allergies  Allergen Reactions  . Shellfish Allergy Anaphylaxis   Review of Systems  Unable to perform ROS: Acuity of condition   Physical Exam  Constitutional: He is easily aroused. He appears ill.  HENT:  Head: Normocephalic and atraumatic.    Cardiovascular: Regular rhythm.   Pulmonary/Chest: No accessory muscle usage. No tachypnea. No respiratory distress. He has decreased breath sounds.  Abdominal: Normal appearance.  Musculoskeletal: He exhibits edema (generalized).  Neurological: He is alert and easily aroused.  Pleasantly confused  Skin: Skin is warm and dry. Ecchymosis noted. There is pallor.  Psychiatric: He has a normal mood and affect. His speech is normal and behavior is normal.  Nursing note and vitals reviewed.  Vital Signs: BP (!) 149/68 (BP Location: Right Arm)   Pulse 60   Temp 98.2 F (36.8 C) (Oral)   Resp 20   Ht 6' (1.829 m)   Wt 113.9 kg (251 lb)   SpO2 100%   BMI 34.04 kg/m  Pain Assessment: 0-10 POSS *See Group Information*:  S-Acceptable,Sleep, easy to arouse Pain Score: 0-No pain  SpO2: SpO2: 100 % O2 Device:SpO2: 100 % O2 Flow Rate: .O2 Flow Rate (L/min): 4 L/min  IO: Intake/output summary:   Intake/Output Summary (Last 24 hours) at 04/25/17 1149 Last data filed at 04/25/17 0800  Gross per 24 hour  Intake                0 ml  Output                0 ml  Net                0 ml    LBM: Last BM Date: 04/24/17 Baseline Weight: Weight: 110.2 kg (243 lb) Most recent weight: Weight: 113.9 kg (251 lb)     Palliative Assessment/Data: PPS 40%   Flowsheet Rows     Most Recent Value  Intake Tab  Referral Department  Hospitalist  Unit at Time of Referral  Med/Surg Unit  Palliative Care Primary Diagnosis  Cardiac  Palliative Care Type  New Palliative care  Reason for referral  Clarify Goals of Care  Date first seen by Palliative Care  04/25/17  Clinical Assessment  Palliative Performance Scale Score  40%  Psychosocial & Spiritual Assessment  Palliative Care Outcomes  Patient/Family meeting held?  Yes  Who was at the meeting?  wife  Palliative Care Outcomes  Clarified goals of care, Provided end of life care assistance, Provided psychosocial or spiritual support, Transitioned to  hospice, ACP counseling assistance, Counseled regarding hospice      Time In: 1030  Time Out: 6191 Time Total: 90mn Greater than 50%  of this time was spent counseling and coordinating care related to the above assessment and plan.  Signed by:  MIhor Dow FNP-C Palliative Medicine Team  Phone: [phone]: [phone]  Please contact Palliative Medicine Team phone at [phone] questions and concerns.  For individual provider: See AShea Evans

## 2017-04-26 LAB — GLUCOSE, CAPILLARY
GLUCOSE-CAPILLARY: 99 mg/dL (ref 65–99)
GLUCOSE-CAPILLARY: 157 mg/dL — AB (ref 65–99)
Glucose-Capillary: 106 mg/dL — ABNORMAL HIGH (ref 65–99)
Glucose-Capillary: 136 mg/dL — ABNORMAL HIGH (ref 65–99)

## 2017-04-26 MED ORDER — FUROSEMIDE 40 MG PO TABS
60.0000 mg | ORAL_TABLET | Freq: Every day | ORAL | Status: AC
  Administered 2017-04-26 – 2017-04-27 (×2): 60 mg via ORAL
  Filled 2017-04-26 (×2): qty 1

## 2017-04-26 NOTE — Care Management (Addendum)
Megan with Palliative asked that I contact patient's wife to discuss her concerns about patient discharge. RNCM spoke with patient's wife regarding discharge planning. She is concerned about caring for him in the home. She is alone. She sees that it takes two here at the hospital to get him up. She states she cannot do it on her own. This is why she is refusing any DME as she does not feel that she is prepared and feels he will be rushed home. She has received a private duty list. I provided more private duty agencies to check costs. I encouraged her to call them today- she agreed. She states she hoped that patient would go to hospice home and not home with hospice but has been told that he isn't appropriate for the home yet.  RNCM will continue to follow. EMS packet started. DNR signed.

## 2017-04-26 NOTE — Progress Notes (Signed)
[facility]  ROUNDING NOTE   Subjective:  Patient seen at bedside.  Overall still remains quite weak. No new complaints. Patient will be going home with hospice.   Objective:  Vital signs in last 24 hours:  Temp:  [97.4 F (36.3 C)-98.4 F (36.9 C)] 98.2 F (36.8 C) (08/30 0522) Pulse Rate:  [60-80] 60 (08/30 0522) Resp:  [18-20] 18 (08/30 0522) BP: (121-140)/(65-74) 132/70 (08/30 0522) SpO2:  [98 %-100 %] 99 % (08/30 0522) Weight:  [114 kg (251 lb 6.4 oz)] 114 kg (251 lb 6.4 oz) (08/30 0553)  Weight change: -0.726 kg (-1 lb 9.6 oz) Filed Weights   04/24/17 1223 04/25/17 0500 04/26/17 0553  Weight: 114.8 kg (253 lb) 113.9 kg (251 lb) 114 kg (251 lb 6.4 oz)    Intake/Output: I/O last 3 completed shifts: In: 480 [P.O.:480] Out: -    Intake/Output this shift:  Total I/O In: 240 [P.O.:240] Out: -   Physical Exam: General: No acute distress, obese male  Head: Normocephalic, atraumatic. Moist oral mucosal membranes  Eyes: Anicteric  Neck: Supple, trachea midline  Lungs:  Clear to auscultation, normal effort  Heart: S1S2 no rubs  Abdomen:  Soft, nontender, bowel sounds present  Extremities: trace peripheral edema.  Neurologic: Awake, alert, following commands, RLE weakness noted  Skin: No lesions       Basic Metabolic Panel:  Recent Labs Lab 04/21/17 0117 04/22/17 0417 04/23/17 0354  NA 148* 148* 144  K 3.6 4.3 4.2  CL 104 108 105  CO2 35* 35* 34*  GLUCOSE 83 111* 126*  BUN 36* 36* 43*  CREATININE 2.48* 2.42* 2.36*  CALCIUM 9.4 9.3 9.1    Liver Function Tests: No results for input(s): AST, ALT, ALKPHOS, BILITOT, PROT, ALBUMIN in the last 168 hours. No results for input(s): LIPASE, AMYLASE in the last 168 hours. No results for input(s): AMMONIA in the last 168 hours.  CBC:  Recent Labs Lab 04/21/17 0117 04/22/17 0417 04/25/17 1028  WBC 7.1 5.7 6.9  HGB 11.2* 10.9* 11.4*  HCT 34.3* 33.6* 35.0*  MCV 95.7 97.3 97.1  PLT 156 142*  178    Cardiac Enzymes:  Recent Labs Lab 04/21/17 0117 04/21/17 0444 04/23/17 0354  TROPONINI 0.04* 0.04* 0.03*    BNP: Invalid input(s): POCBNP  CBG:  Recent Labs Lab 04/25/17 0851 04/25/17 1127 04/25/17 1639 04/25/17 2104 04/26/17 0736  GLUCAP 158* 146* 105* 185* 99    Microbiology: Results for orders placed or performed during the hospital encounter of 04/21/17  Surgical pcr screen     Status: None   Collection Time: 04/23/17  9:01 PM  Result Value Ref Range Status   MRSA, PCR NEGATIVE NEGATIVE Final   Staphylococcus aureus NEGATIVE NEGATIVE Final    Comment:        The Xpert SA Assay (FDA approved for NASAL specimens in patients over 33 years of age), is one component of a comprehensive surveillance program.  Test performance has been validated by [facility] for patients greater than or equal to 53 year old. It is not intended to diagnose infection nor to guide or monitor treatment.     Coagulation Studies: No results for input(s): LABPROT, INR in the last 72 hours.  Urinalysis: No results for input(s): COLORURINE, LABSPEC, PHURINE, GLUCOSEU, HGBUR, BILIRUBINUR, KETONESUR, PROTEINUR, UROBILINOGEN, NITRITE, LEUKOCYTESUR in the last 72 hours.  Invalid input(s): APPERANCEUR    Imaging: Nm Myocar Multi W/spect W/wall Motion / Ef  Result Date: 04/24/2017  Abnormal, potenially high  risk study (based predominantly on low LVEF).  There is a moderate in size, moderate in severity, partially reversible defect involving the inferolateral wall consistent with ischemia and scar.  Dilated left ventricle with moderately to severely reduced contraction (LVEF 32% by QGS).      Medications:   . sodium chloride     . amiodarone  200 mg Oral Daily  . apixaban  5 mg Oral BID  . aspirin EC  81 mg Oral Daily  . calcitRIOL  0.5 mcg Oral Q breakfast  . carvedilol  3.125 mg Oral BID WC  . docusate sodium  100 mg Oral BID  . feeding supplement (PRO-STAT SUGAR  FREE 64)  30 mL Oral BID BM  . finasteride  5 mg Oral PC supper  . fludrocortisone  0.1 mg Oral Daily  . hydrocortisone  10 mg Oral BID  . insulin aspart  0-5 Units Subcutaneous QHS  . insulin aspart  0-9 Units Subcutaneous TID WC  . insulin aspart protamine- aspart  5 Units Subcutaneous BID WC  . lisinopril  2.5 mg Oral Daily  . mouth rinse  15 mL Mouth Rinse BID  . Melatonin  2.5 mg Oral QHS  . pantoprazole  40 mg Oral QAC breakfast  . rosuvastatin  20 mg Oral QHS  . sodium chloride flush  3 mL Intravenous Q12H  . triamcinolone  2 spray Nasal Daily   sodium chloride, acetaminophen **OR** acetaminophen, albuterol, diphenhydrAMINE, ondansetron **OR** ondansetron (ZOFRAN) IV, polyethylene glycol, sodium chloride flush  Assessment/ Plan:  81 y.o. male with hypertension, diabetes mellitus type II, hyperlipidemia, chronic diastolic heart failure, coronary disease status post CABG in 2006, abdominal aortic aneurysm repair, and permanent pacemaker placement, CKD stage IV, anemia of CKD, SHPTH, orthostatic hypotension with supine hypertension here with acute CVA.   1. Chronic kidney disease stage IV/proteinuria/diabetes mellitus type 2 with CKD.  No new renal function testing available today. Most recent creatinine was 2.36 with an EGFR 25 which indicates stable chronic kidney disease. Patient would make a very poor dialysis candidate given his overall comorbidities. It appears that he will be going home with hospice.  2.  Orthostatic hypotension. continue fludrocortisone at this time.   3. Anemia of chronic kidney disease. hemoglobin up to 11. No indication for Procrit.  4. Disposition. Patient to go home with hospice. Suspect that his condition will continue to worsen.   LOS: 3 Vi Whitesel 8/30/201810:32 AM

## 2017-04-26 NOTE — Telephone Encounter (Signed)
Langley Gauss and Ivin Booty advised as below-aa

## 2017-04-26 NOTE — Telephone Encounter (Signed)
ok 

## 2017-04-26 NOTE — Progress Notes (Signed)
Follow up visit made. Patient seen sitting up in bed, alert and interactive with Probation officer, able to answer questions and discuss discharge plan, wife Diane present as well. Patient was to discharge home today, however his wife feels that, after several conversations with Probation officer, attending physician and Palliative NP, plan is for discharge tomorrow. She has been given the list of hired caregivers by [facility] and will contact someone today regarding extra help at home. She has also reconsidered the need for DME. DME needs include a hospital bed, bedside commode and oxygen. She has arranged for her family to help move furniture and will be ready for him to transfer home tomorrow AFTER the DME is delivered. Hospital care team and referral all made aware of changes. DME has been ordered. Will continue to follow through final disposition. Thank you. Flo Shanks RN, BSN, [facility] [place], [facility] [phone] c

## 2017-04-26 NOTE — Progress Notes (Signed)
[place] [facility] [place] NAME: Marco [name]    MR#:  [id number]  DATE OF BIRTH:  02/23/1936  SUBJECTIVE:  CHIEF COMPLAINT:   Chief Complaint  Patient presents with  . Dizziness   Eating breakfast. Still feels weak  REVIEW OF SYSTEMS:    Review of Systems  Constitutional: Positive for malaise/fatigue. Negative for chills and fever.  HENT: Negative for sore throat.   Eyes: Negative for blurred vision, double vision and pain.  Respiratory: Negative for cough, hemoptysis, shortness of breath and wheezing.   Cardiovascular: Negative for chest pain, palpitations, orthopnea and leg swelling.  Gastrointestinal: Negative for abdominal pain, constipation, diarrhea, heartburn, nausea and vomiting.  Genitourinary: Negative for dysuria and hematuria.  Musculoskeletal: Negative for back pain and joint pain.  Skin: Negative for rash.  Neurological: Positive for dizziness, focal weakness and weakness. Negative for sensory change, speech change and headaches.  Endo/Heme/Allergies: Does not bruise/bleed easily.  Psychiatric/Behavioral: Negative for depression. The patient is not nervous/anxious.    DRUG ALLERGIES:   Allergies  Allergen Reactions  . Shellfish Allergy Anaphylaxis    VITALS:  Blood pressure 132/70, pulse 60, temperature 98.2 F (36.8 C), resp. rate 18, height 6' (1.829 m), weight 114 kg (251 lb 6.4 oz), SpO2 99 %.  PHYSICAL EXAMINATION:   Physical Exam  GENERAL:  81 y.o.-year-old patient lying in the bed with no acute distress. EYES: Pupils equal, round, reactive to light and accommodation. No scleral icterus. Extraocular muscles intact. HEENT: Head atraumatic, normocephalic. Oropharynx and nasopharynx clear. NECK:  Supple, no jugular venous distention. No thyroid enlargement, no tenderness.  LUNGS: Normal breath sounds bilaterally, no wheezing, rales, rhonchi. No use of accessory muscles of respiration.  CARDIOVASCULAR: S1, S2  normal. No murmurs, rubs, or gallops.  ABDOMEN: Soft, nontender, nondistended. Bowel sounds present. No organomegaly or mass. EXTREMITIES: No cyanosis, clubbing or edema b/l.    NEUROLOGIC: Cranial nerves II through XII are intact.  Right lower extremity weakness PSYCHIATRIC: The patient is drowsy SKIN: No obvious rash, lesion, or ulcer.   LABORATORY PANEL:   CBC  Recent Labs Lab 04/25/17 1028  WBC 6.9  HGB 11.4*  HCT 35.0*  PLT 178   ------------------------------------------------------------------------------------------------------------------ Chemistries   Recent Labs Lab 04/23/17 0354  NA 144  K 4.2  CL 105  CO2 34*  GLUCOSE 126*  BUN 43*  CREATININE 2.36*  CALCIUM 9.1   ------------------------------------------------------------------------------------------------------------------  Cardiac Enzymes  Recent Labs Lab 04/23/17 0354  TROPONINI 0.03*   ------------------------------------------------------------------------------------------------------------------  RADIOLOGY:  Nm Myocar Multi W/spect W/wall Motion / Ef  Result Date: 04/24/2017  Abnormal, potenially high risk study (based predominantly on low LVEF).  There is a moderate in size, moderate in severity, partially reversible defect involving the inferolateral wall consistent with ischemia and scar.  Dilated left ventricle with moderately to severely reduced contraction (LVEF 32% by QGS).      ASSESSMENT AND PLAN:   * Acute CVA with RLE weakness  CT scan of the head shows nothing acute other than old strokes. Cannot get an MRI due to permanent pacemaker. CTH repeated and no change. He does have greater than 70% carotid artery occlusion. Recent echocardiogram reviewed and shows no thrombus. Does have risk factor of atrial fibrillation and is on Eliquis.  * Left carotid stenosis >70% Carotid angiogram done 04/24/2017. Initially carotid surgery planned. But patient has no improvement from  his stroke. Presently plan is to go home with hospice and avoid aggressive procedures.  * Chest  pain Resolved Stress test done. Discussed with Dr. Rockey Situ. No cardiac catheterization planned.  * Chronic dizziness. Continue to monitor. Orthostatics positive. Bolused IV fluid yesterday  * Insulin dependent diabetes mellitus. Continue home dose. Sliding scale insulin added. Diabetic diet.  * pAfib Restarted Eliquis  * Uncontrolled hypertension. Patient's blood pressure. medications were stopped due to having worsening. orthostatic hypotension. started low dose lisinopril at 5 mg daily. Add low-dose Coreg today  * Chronic systolic congestive heart failure. Start lasix for mild edema  * CKD stage IV.  Received contrast for carotid angiogram 04/24/2017.   * DVT prophylaxis. On Eliquis.   Discussed with palliative care, Hospice nurse ad Dr. Holley Raring.  CODE STATUS: DNR  DVT Prophylaxis: SCDs  TOTAL TIME TAKING CARE OF THIS PATIENT: 35 minutes. >50% time spent in discussing with patient and wife regarding prognosis and discharge planning   Neita Carp M.D on 04/26/2017 at 10:45 AM  Between 7am to 6pm - Pager - [phone]  After 6pm go to [email or web address] - password [email or web address] [facility]  Office  [phone]  CC: Primary care physician; [name], MD  Note: This dictation was prepared with Dragon dictation along with smaller phrase technology. Any transcriptional errors that result from this process are unintentional.

## 2017-04-27 LAB — GLUCOSE, CAPILLARY
GLUCOSE-CAPILLARY: 121 mg/dL — AB (ref 65–99)
Glucose-Capillary: 137 mg/dL — ABNORMAL HIGH (ref 65–99)

## 2017-04-27 NOTE — Care Management Important Message (Signed)
Important Message  Patient Details  Name: Marco Yale Sr. MRN: [id number] Date of Birth: 1935/09/02   Medicare Important Message Given:  Yes    Marshell Garfinkel, RN 04/27/2017, 8:16 AM

## 2017-04-27 NOTE — Progress Notes (Signed)
[facility]  ROUNDING NOTE   Subjective:  Patient seen at bedside. No new renal function testing today. Blood pressure currently 127/61.   Objective:  Vital signs in last 24 hours:  Temp:  [97.8 F (36.6 C)-98.4 F (36.9 C)] 97.9 F (36.6 C) (08/31 0446) Pulse Rate:  [58-75] 63 (08/31 0446) Resp:  [16-20] 18 (08/31 0446) BP: (125-134)/(56-79) 127/61 (08/31 0446) SpO2:  [99 %-100 %] 100 % (08/31 0446) Weight:  [115.2 kg (254 lb)] 115.2 kg (254 lb) (08/31 0500)  Weight change: 1.179 kg (2 lb 9.6 oz) Filed Weights   04/25/17 0500 04/26/17 0553 04/27/17 0500  Weight: 113.9 kg (251 lb) 114 kg (251 lb 6.4 oz) 115.2 kg (254 lb)    Intake/Output: I/O last 3 completed shifts: In: 240 [P.O.:240] Out: -    Intake/Output this shift:  Total I/O In: 360 [P.O.:360] Out: -   Physical Exam: General: No acute distress, obese male  Head: Normocephalic, atraumatic. Moist oral mucosal membranes  Eyes: Anicteric  Neck: Supple, trachea midline  Lungs:  Clear to auscultation, normal effort  Heart: S1S2 no rubs  Abdomen:  Soft, nontender, bowel sounds present  Extremities: trace peripheral edema.  Neurologic: Lethargic but arousable   Skin: No lesions       Basic Metabolic Panel:  Recent Labs Lab 04/21/17 0117 04/22/17 0417 04/23/17 0354  NA 148* 148* 144  K 3.6 4.3 4.2  CL 104 108 105  CO2 35* 35* 34*  GLUCOSE 83 111* 126*  BUN 36* 36* 43*  CREATININE 2.48* 2.42* 2.36*  CALCIUM 9.4 9.3 9.1    Liver Function Tests: No results for input(s): AST, ALT, ALKPHOS, BILITOT, PROT, ALBUMIN in the last 168 hours. No results for input(s): LIPASE, AMYLASE in the last 168 hours. No results for input(s): AMMONIA in the last 168 hours.  CBC:  Recent Labs Lab 04/21/17 0117 04/22/17 0417 04/25/17 1028  WBC 7.1 5.7 6.9  HGB 11.2* 10.9* 11.4*  HCT 34.3* 33.6* 35.0*  MCV 95.7 97.3 97.1  PLT 156 142* 178    Cardiac Enzymes:  Recent Labs Lab 04/21/17 0117  04/21/17 0444 04/23/17 0354  TROPONINI 0.04* 0.04* 0.03*    BNP: Invalid input(s): POCBNP  CBG:  Recent Labs Lab 04/26/17 1131 04/26/17 1635 04/26/17 1953 04/27/17 0725 04/27/17 1139  GLUCAP 106* 157* 136* 121* 74*    Microbiology: Results for orders placed or performed during the hospital encounter of 04/21/17  Surgical pcr screen     Status: None   Collection Time: 04/23/17  9:01 PM  Result Value Ref Range Status   MRSA, PCR NEGATIVE NEGATIVE Final   Staphylococcus aureus NEGATIVE NEGATIVE Final    Comment:        The Xpert SA Assay (FDA approved for NASAL specimens in patients over 40 years of age), is one component of a comprehensive surveillance program.  Test performance has been validated by [facility] for patients greater than or equal to 71 year old. It is not intended to diagnose infection nor to guide or monitor treatment.     Coagulation Studies: No results for input(s): LABPROT, INR in the last 72 hours.  Urinalysis: No results for input(s): COLORURINE, LABSPEC, PHURINE, GLUCOSEU, HGBUR, BILIRUBINUR, KETONESUR, PROTEINUR, UROBILINOGEN, NITRITE, LEUKOCYTESUR in the last 72 hours.  Invalid input(s): APPERANCEUR    Imaging: No results found.   Medications:   . sodium chloride     . amiodarone  200 mg Oral Daily  . apixaban  5 mg Oral  BID  . aspirin EC  81 mg Oral Daily  . calcitRIOL  0.5 mcg Oral Q breakfast  . carvedilol  3.125 mg Oral BID WC  . docusate sodium  100 mg Oral BID  . feeding supplement (PRO-STAT SUGAR FREE 64)  30 mL Oral BID BM  . finasteride  5 mg Oral PC supper  . fludrocortisone  0.1 mg Oral Daily  . hydrocortisone  10 mg Oral BID  . insulin aspart  0-5 Units Subcutaneous QHS  . insulin aspart  0-9 Units Subcutaneous TID WC  . insulin aspart protamine- aspart  5 Units Subcutaneous BID WC  . lisinopril  2.5 mg Oral Daily  . mouth rinse  15 mL Mouth Rinse BID  . Melatonin  2.5 mg Oral QHS  . pantoprazole  40 mg  Oral QAC breakfast  . rosuvastatin  20 mg Oral QHS  . sodium chloride flush  3 mL Intravenous Q12H  . triamcinolone  2 spray Nasal Daily   sodium chloride, acetaminophen **OR** acetaminophen, albuterol, diphenhydrAMINE, ondansetron **OR** ondansetron (ZOFRAN) IV, polyethylene glycol, sodium chloride flush  Assessment/ Plan:  81 y.o. male with hypertension, diabetes mellitus type II, hyperlipidemia, chronic diastolic heart failure, coronary disease status post CABG in 2006, abdominal aortic aneurysm repair, and permanent pacemaker placement, CKD stage IV, anemia of CKD, SHPTH, orthostatic hypotension with supine hypertension here with acute CVA.   1. Chronic kidney disease stage IV/proteinuria/diabetes mellitus type 2 with CKD.  It appears that the patient's renal function has been relatively stable over the course of this hospitalization. Family moving towards hospice at this time.  2.  Orthostatic hypotension. Patient remains on fludrocortisone at this time. Blood pressure currently 127/61. Patient does have dizziness upon being upright.   3. Anemia of chronic kidney disease. No indication for Epogen.  4. Disposition. Patient being transitioned to home hospice.   LOS: 4 Amberly Livas 8/31/201811:52 AM

## 2017-04-27 NOTE — Care Management (Signed)
Per hospice RN DME has been delivered to patient's address. Patient states he is ready to go home and agrees with hospice services. Wife will come to [facility] to go over discharge paperwork and then RN can call EMS. RN updated. No other RNCM needs.

## 2017-04-27 NOTE — Care Management (Signed)
RNCM has updated Marco [name] with [facility] of current disposition (return to home followed by hospice). RNCM will continue to follow.

## 2017-05-01 NOTE — Discharge Summary (Signed)
[facility] NAME: Marco [name]    MR#:  [id number]  DATE OF BIRTH:  Jun 03, 1936  DATE OF ADMISSION:  04/21/2017 ADMITTING PHYSICIAN: Marco Bow, MD  DATE OF DISCHARGE: 04/27/2017  2:21 PM  PRIMARY CARE PHYSICIAN: Marco [name]., MD   ADMISSION DIAGNOSIS:  Dizziness [R42]  DISCHARGE DIAGNOSIS:  Active Problems:   Dizziness   Acute CVA (cerebrovascular accident) ([place])   Unstageable pressure ulcer of sacral region [facility])   Palliative care by specialist   SECONDARY DIAGNOSIS:   Past Medical History:  Diagnosis Date  . AAA (abdominal aortic aneurysm)repaired    a. 12/2006 s/p repair of 6cm infrarenal AAA with 70mm Dacron graft.  . Anemia   . Anginal [name] ([place])   . Atrial fibrillation/Flutter    a. Dx 2014;  b. 10/2012 s/p TEE/DCCV;  c. On amio and Eliquis (CHA2DS2VASc = 6).  . Basal cell carcinoma    a. 2013 x 2 back and left neck  . BPH (benign prostatic hypertrophy)   . Carotid artery occlusion    a. 11/2008 s/p R CEA; b. 01/2017 Carotid U/S: RICA <43%, LICA >15%.  . Chronic systolic heart failure ([place])    a. 02/2012 Echo: EF 30-35%, Pulm HTN, and modest RV dysfunction;  b. 10/2012 TEE: EF 25-30%, mild conc LVH, diff HK, mod RV dysfxn, mod dil RA/LA, mild MR, no MS, nl MVR, mod TR, Ao sclerosis; c. 01/2017 Echo: EF 45-50%, diff HK, triv MR, mod dil LA.  . CKD (chronic kidney disease), stage IV ([place])   . Coronary artery disease    a. 1999 PCI RCA;  b. 01/2005 NSTEMI: CABG x 3 (LIMA->LAD, VG->OM, VG->LPL) @ time of MVR.  . Diabetes mellitus, insulin dependent (IDDM), controlled ([place])   . DVT (deep venous thrombosis) ([place])   . GERD (gastroesophageal reflux disease)   . Heart disease   . History of hiatal hernia   . History of shingles   . Hyperlipidemia   . Hyperlipidemia, unspecified   . Hypertension   . Hypertensive heart disease   . Mitral regurgitation--s/p repair    a. 01/2005 MVR: 28-mm Edwards ET Logix ring  annuloplasty; b. 10/2012 TEE: normal fxning MV ring with mild MR, no MS; 01/2017 Echo: Triv MR.  . Mixed Ischemic and Non-ischemic Cardiomyopathy    a. 02/2012 Echo: EF 30-35%, Pulm HTN, and modest RV dysfunction;  b. 10/2012 TEE: EF 25-30%, mild conc LVH, diff HK, mod RV dysfxn, mod dil RA/LA, mild MR, no MS, nl MVR, mod TR, Ao sclerosis; c. 01/2017 Echo: EF 45-50%.  . Myocardial infarction ([place])    2006  . Neuropathy    SOME  . Obesity   . Onychocryptosis   . Onychomycosis   . Orthopnea   . Orthostatic hypotension    a. 11/2016 -> meds reduced;  b. 01/2017 admitted w/ presyncope and orthostasis->Meds held.  . Oxygen deficit    a. Wears 2 lpm most of the day and @ HS.  . Paresthesias    in the feet  . Paronychia of third toe, right    contusion with paronychia right third toe  . Presence of permanent cardiac pacemaker   . PVC (premature ventricular contraction)    a. 02/2009 s/p RFCA ([place]).  . Sick sinus syndrome ([place])    a. 01/2005 s/p PPM 2/2 heart block following CABG;  b. 05/2012 Gen Change: MDT Adapta ADDRL1 DC PPM, ser # [id number] H.  [name]  Sleep apnea    a. Intolerant of CPAP  . Type II diabetes mellitus ([place])      ADMITTING HISTORY  HISTORY OF PRESENT ILLNESS:  Marco [name]  is a 81 y.o. male with a known history of Diabetes, hypertension, congestive heart failure, DVT, CKD stage III, chronic orthostatic hypotension presents to the emergency room complaining of weakness and acute onset of dizziness. Patient's wife has also noticed that he is weaker on the right leg. Patient does not remember when this started. He has had orthostatic hypotension and his blood pressure medications have been stopped over the last 2 admissions. He was started on Florinef, hydrocortisone and discharged to skilled nursing facility. Patient went home on Tuesday. Continues to feel weak. But due to acute onset of dizziness overnight he has presented to the emergency room. Orthostatic vitals positive in the  ER. Blood pressure systolic dropped from 517-001. On home oxygen.   HOSPITAL COURSE:   * Acute CVA with RLE weakness  CT scan of the head shows nothing acute other than old strokes. Cannot get an MRI due to permanent pacemaker. CTH repeated and no change. He does have greater than 70% carotid artery occlusion. Recent echocardiogram reviewed and shows no thrombus. Does have risk factor of atrial fibrillation and is on Eliquis.  * Left carotid stenosis >70% Carotid angiogram done 04/24/2017. Initially carotid surgery planned. But patient has no improvement from his stroke. Presently plan is to go home with hospice and avoid aggressive procedures due to poor long-term prognosis.  * Chest [name] Resolved Stress test done. Discussed with Marco [name]. No cardiac catheterization planned.  * Chronic dizziness. Continue to monitor. Orthostatics positive. Bolused IV fluid and improved  * Insulin dependent diabetes mellitus. Continue home dose. Sliding scale insulin added. Diabetic diet.  * pAfib Restarted Eliquis  * Uncontrolled hypertension. Patient's blood pressure. medications were stopped due to having worsening. orthostatic hypotension in the past Due to significantly elevated blood pressure low-dose lisinopril and Coreg started.  * Chronic systolic congestive heart failure. On PO lasix  * CKD stage IV.  Received contrast for carotid angiogram 04/24/2017.  Cr stable  Home hospice has been set up. Patient will be followed by hospice at home. Plan is to transition to comfort measures if any worsening.  CONSULTS OBTAINED:  Treatment Team:  Marco Pain, MD Marco Bury, MD Marco Legato, MD  DRUG ALLERGIES:   Allergies  Allergen Reactions  . Shellfish Allergy Anaphylaxis    DISCHARGE MEDICATIONS:   Discharge Medication List as of 04/27/2017  2:02 PM    START taking these medications   Details  carvedilol (COREG) 3.125 MG tablet Take 1 tablet (3.125 mg  total) by mouth 2 (two) times daily with a meal., Starting Wed 04/25/2017, Normal    !! lisinopril (PRINIVIL,ZESTRIL) 2.5 MG tablet Take 1 tablet (2.5 mg total) by mouth daily., Starting Thu 04/26/2017, Normal    !! lisinopril (PRINIVIL,ZESTRIL) 5 MG tablet Take 1 tablet (5 mg total) by mouth daily., Starting Tue 04/24/2017, Normal    Morphine Sulfate (MORPHINE CONCENTRATE) 10 mg / 0.5 ml concentrated solution Take 0.5 mLs (10 mg total) by mouth every 3 (three) hours as needed for severe [name] or shortness of breath., Starting Wed 04/25/2017, Normal     !! - Potential duplicate medications found. Please discuss with provider.    CONTINUE these medications which have NOT CHANGED   Details  ACCU-CHEK FASTCLIX LANCETS MISC TEST twice a day if needed, Normal    ACCU-CHEK  SMARTVIEW test strip TEST twice a day, Normal    albuterol (PROVENTIL) (2.5 MG/3ML) 0.083% nebulizer solution Take 3 mLs (2.5 mg total) by nebulization every 6 (six) hours as needed for wheezing or shortness of breath., Starting Wed 02/07/2017, Print    amiodarone (PACERONE) 200 MG tablet take 1 tablet by mouth once daily, Normal    BD INSULIN SYRINGE ULTRAFINE 31G X 5/16" 0.3 ML MISC use twice a day as directed, Normal    calcitRIOL (ROCALTROL) 0.5 MCG capsule Take 0.5 mcg by mouth daily., Historical Med    Cholecalciferol (VITAMIN D3) 2000 units capsule Take 2,000 Units by mouth daily., Historical Med    collagenase (SANTYL) ointment Apply 1 application topically daily. Apply a thick layer topically to wound on left chest, cover with ns damp to dry gauze, cover with Allevyn. Change gauze daily. Change Allevyn every 3 days., Historical Med    dextrose (GLUTOSE) 40 % GEL Take 1 Tube by mouth once as needed for low blood sugar. Give 1 tube every 30 minutes orally as needed until BS > 70 and pt is still responsive., Historical Med    docusate sodium (COLACE) 100 MG capsule Take 100 mg by mouth 2 (two) times daily. , Historical Med     ELIQUIS 5 MG TABS tablet take 1 tablet by mouth twice a day, Normal    finasteride (PROSCAR) 5 MG tablet Take 1 tablet (5 mg total) by mouth daily after supper., Starting Tue 03/20/2017, Normal    fludrocortisone (FLORINEF) 0.1 MG tablet Take 0.1 mg by mouth daily., Historical Med    glucagon (GLUCAGON EMERGENCY) 1 MG injection Inject 1 mg into the vein once as needed. Give 1 mg for hypoglycemia (symptomatic) not responding to oral glucose, snack. May repeat x 1 after 15 minutes. USE ONLY IF PT IS OR IS BECOMING UNRESPONSIVE., Historical Med    GLUCERNA (GLUCERNA) LIQD Take 1 Can by mouth 2 (two) times daily between meals., Historical Med    hydrocortisone (CORTEF) 10 MG tablet Take 10 mg by mouth 2 (two) times daily., Historical Med    insulin lispro protamine-lispro (HUMALOG MIX 75/25) (75-25) 100 UNIT/ML SUSP injection Inject 5 Units into the skin 2 (two) times daily with a meal., Starting Wed 02/07/2017, Print    Melatonin 3 MG TABS Take 1 tablet by mouth at bedtime., Historical Med    Multiple Vitamins-Minerals (MULTIVITAL) tablet Take 1 tablet by mouth daily., Historical Med    omeprazole (PRILOSEC) 20 MG capsule Take 20 mg by mouth daily. Monday, Wednesday, friday , Historical Med    OXYGEN Inhale 3 L into the lungs continuous. , Historical Med    polyethylene glycol (MIRALAX / GLYCOLAX) packet Take 17 g by mouth daily as needed for mild constipation., Starting Mon 06/12/2016, Normal    rosuvastatin (CRESTOR) 20 MG tablet take 1 tablet by mouth at bedtime, Normal    Skin Protectants, Misc. (ENDIT EX) Apply liberal amount topically to gluteal fold wound and surrounding tissue 2 times daily and as needed. Do not scrub skin. If soiled, wipe off top layer and add more cream., Historical Med    triamcinolone (NASACORT ALLERGY 24HR) 55 MCG/ACT AERO nasal inhaler Place 2 sprays into the nose daily. , Historical Med    Zinc Oxide (BOUDREAUXS BUTT PASTE) 16 % OINT Apply liberal amount  topically to areas of skin irritation 2 times daily and as needed. Ok to leave at bedside., Historical Med    zolpidem (AMBIEN) 5 MG tablet Take 5 mg  by mouth at bedtime as needed for sleep., Historical Med    Amino Acids-Protein Hydrolys (FEEDING SUPPLEMENT, PRO-STAT SUGAR FREE 64,) LIQD Take 30 mLs by mouth 2 (two) times daily between meals., Historical Med    bacitracin 500 UNIT/GM ointment Apply 1 application topically daily. Apply to tip of 2nd toe, Historical Med        Today   VITAL SIGNS:  Blood pressure (!) 134/56, pulse 76, temperature 97.8 F (36.6 C), temperature source Oral, resp. rate 18, height 6' (1.829 m), weight 115.2 kg (254 lb), SpO2 100 %.  I/O:  No intake or output data in the 24 hours ending 05/01/17 1455  PHYSICAL EXAMINATION:  Physical Exam  GENERAL:  81 y.o.-year-old patient lying in the bed with no acute distress.  LUNGS: Normal breath sounds bilaterally, no wheezing, rales,rhonchi or crepitation. No use of accessory muscles of respiration.  CARDIOVASCULAR: S1, S2 normal. No murmurs, rubs, or gallops.  ABDOMEN: Soft, non-tender, non-distended. Bowel sounds present. No organomegaly or mass.  NEUROLOGIC: Moves all 4 extremities.RLE weakness PSYCHIATRIC: The patient is alert and oriented x 3.  SKIN: No obvious rash, lesion, or ulcer.   DATA REVIEW:   CBC  Recent Labs Lab 04/25/17 1028  WBC 6.9  HGB 11.4*  HCT 35.0*  PLT 178    Chemistries  No results for input(s): NA, K, CL, CO2, GLUCOSE, BUN, CREATININE, CALCIUM, MG, AST, ALT, ALKPHOS, BILITOT in the last 168 hours.  Invalid input(s): GFRCGP  Cardiac Enzymes No results for input(s): TROPONINI in the last 168 hours.  Microbiology Results  Results for orders placed or performed during the hospital encounter of 04/21/17  Surgical pcr screen     Status: None   Collection Time: 04/23/17  9:01 PM  Result Value Ref Range Status   MRSA, PCR NEGATIVE NEGATIVE Final   Staphylococcus aureus  NEGATIVE NEGATIVE Final    Comment:        The Xpert SA Assay (FDA approved for NASAL specimens in patients over 78 years of age), is one component of a comprehensive surveillance program.  Test performance has been validated by [facility] for patients greater than or equal to 42 year old. It is not intended to diagnose infection nor to guide or monitor treatment.     RADIOLOGY:  No results found.  Follow up with PCP in 1 week.  Management plans discussed with the patient, family and they are in agreement.  CODE STATUS:  Code Status History    Date Active Date Inactive Code Status Order ID Comments User Context   04/21/2017 12:51 PM 04/27/2017  5:24 PM DNR [id number]  Marco Bow, MD Inpatient   04/21/2017  9:01 AM 04/21/2017 12:51 PM Full Code [id number]  Marco Bow, MD ED   03/15/2017  4:20 PM 03/20/2017  8:46 PM Full Code [id number]  Lance Coon, MD Inpatient   02/02/2017  1:52 AM 02/07/2017  6:50 PM Full Code [id number]  Hugelmeyer, Alexis, DO Inpatient   06/10/2016  9:09 PM 06/12/2016  3:20 PM Full Code [id number]  Idelle Crouch, MD Inpatient   10/28/2015 11:14 AM 10/29/2015  3:01 PM Full Code [id number]  Marco Bow, MD ED    Questions for Most Recent Historical Code Status (Order [id number])    Question Answer Comment   In the event of cardiac or respiratory ARREST Do not call a "code blue"    In the event of cardiac or respiratory ARREST Do not perform Intubation, CPR, defibrillation or ACLS  In the event of cardiac or respiratory ARREST Use medication by any route, position, wound care, and other measures to relive [name] and suffering. May use oxygen, suction and manual treatment of airway obstruction as needed for comfort.         Advance Directive Documentation     Most Recent Value  Type of Advance Directive  Healthcare Power of Attorney, Living will  Pre-existing out of facility DNR order (yellow form or pink MOST form)  -  "MOST" Form in Place?  -       TOTAL TIME TAKING CARE OF THIS PATIENT ON DAY OF DISCHARGE: more than 30 minutes.   Marco [name] R M.D on 05/01/2017 at 2:55 PM  Between 7am to 6pm - Pager - [phone]  After 6pm go to [email or web address] - password [email or web address] [email or web address]  Office  [phone]  CC: Primary care physician; Marco [name]., MD  Note: This dictation was prepared with Dragon dictation along with smaller phrase technology. Any transcriptional errors that result from this process are unintentional.

## 2017-05-02 ENCOUNTER — Other Ambulatory Visit: Payer: Self-pay | Admitting: *Deleted

## 2017-05-02 ENCOUNTER — Inpatient Hospital Stay: Payer: Medicare Other | Admitting: Family Medicine

## 2017-05-02 ENCOUNTER — Encounter: Payer: Self-pay | Admitting: *Deleted

## 2017-05-02 NOTE — Patient Outreach (Signed)
[facility]) Care Management  05/02/2017  Dick Hark Sr. [birth year] [id number]  Placed follow up call to patient, able to speak with patient wife, Khaza Blansett, she discussed patient now has Home hospice services.  She discussed patient now sleeping in hospital bed and has all equipment needs met.   Discussed now that patient is  active with [facility] hospice services and they are managing his care,  [facility] care management services will be a duplication of services and we will close case.    Plan Will close to care management, will send MD case closure letter, will send MD notification.   [name], RN, [place] Management Coordinator  [phone]- Mobile [phone]- Toll Free Main Office

## 2017-05-02 NOTE — Progress Notes (Signed)
This encounter was created in error - please disregard.

## 2017-05-02 NOTE — Addendum Note (Signed)
Addended by: Virgel Manifold on: 05/02/2017 01:39 PM   Modules accepted: Level of Service, SmartSet

## 2017-05-03 ENCOUNTER — Inpatient Hospital Stay: Payer: Medicare Other | Admitting: Family Medicine

## 2017-05-03 ENCOUNTER — Encounter: Payer: Self-pay | Admitting: Emergency Medicine

## 2017-05-03 ENCOUNTER — Emergency Department
Admission: EM | Admit: 2017-05-03 | Discharge: 2017-05-03 | Disposition: A | Discharge status: Hospice | Attending: Emergency Medicine | Admitting: Emergency Medicine

## 2017-05-03 DIAGNOSIS — I480 Paroxysmal atrial fibrillation: Secondary | ICD-10-CM | POA: Diagnosis not present

## 2017-05-03 DIAGNOSIS — I5043 Acute on chronic combined systolic (congestive) and diastolic (congestive) heart failure: Secondary | ICD-10-CM | POA: Insufficient documentation

## 2017-05-03 DIAGNOSIS — R0902 Hypoxemia: Secondary | ICD-10-CM | POA: Insufficient documentation

## 2017-05-03 DIAGNOSIS — Z9981 Dependence on supplemental oxygen: Secondary | ICD-10-CM | POA: Diagnosis not present

## 2017-05-03 DIAGNOSIS — I130 Hypertensive heart and chronic kidney disease with heart failure and stage 1 through stage 4 chronic kidney disease, or unspecified chronic kidney disease: Secondary | ICD-10-CM | POA: Insufficient documentation

## 2017-05-03 DIAGNOSIS — Z79899 Other long term (current) drug therapy: Secondary | ICD-10-CM | POA: Diagnosis not present

## 2017-05-03 DIAGNOSIS — Z951 Presence of aortocoronary bypass graft: Secondary | ICD-10-CM | POA: Insufficient documentation

## 2017-05-03 DIAGNOSIS — Z87891 Personal history of nicotine dependence: Secondary | ICD-10-CM | POA: Diagnosis not present

## 2017-05-03 DIAGNOSIS — Z7901 Long term (current) use of anticoagulants: Secondary | ICD-10-CM | POA: Diagnosis not present

## 2017-05-03 DIAGNOSIS — I2510 Atherosclerotic heart disease of native coronary artery without angina pectoris: Secondary | ICD-10-CM | POA: Diagnosis not present

## 2017-05-03 DIAGNOSIS — E1140 Type 2 diabetes mellitus with diabetic neuropathy, unspecified: Secondary | ICD-10-CM | POA: Diagnosis not present

## 2017-05-03 DIAGNOSIS — N184 Chronic kidney disease, stage 4 (severe): Secondary | ICD-10-CM | POA: Insufficient documentation

## 2017-05-03 DIAGNOSIS — Z794 Long term (current) use of insulin: Secondary | ICD-10-CM | POA: Diagnosis not present

## 2017-05-03 DIAGNOSIS — R0602 Shortness of breath: Secondary | ICD-10-CM | POA: Diagnosis not present

## 2017-05-03 DIAGNOSIS — Z7401 Bed confinement status: Secondary | ICD-10-CM | POA: Diagnosis not present

## 2017-05-03 NOTE — ED Notes (Signed)
ED Provider at bedside. 

## 2017-05-03 NOTE — ED Triage Notes (Signed)
Pt bib [facility] d/t hypoxemia. Upon EMS arrival to home (pt home with hospice, did not call hospice prior to 911), pt O2 line was kinked. Pt on 3L at home. Pt states feeling much better at arrival to ER, saturation 100% on 3L.

## 2017-05-03 NOTE — ED Provider Notes (Signed)
[facility] Emergency Department Provider Note   First MD Initiated Contact with Patient 05/03/17 [phone]     (approximate)  I have reviewed the triage vital signs and the nursing notes.   HISTORY  Chief Complaint Shortness of Breath    HPI Marco Benjerman Molinelli Sr. is a 81 y.o. male with below list of chronic medical conditionspresents to emergency department via [facility] with history ofdyspnea, "feeling like going in and out" while at hometonight with "hallucinations. EMS stated that on their arrival patient's oxygen saturation was 80. They noted that there was a tight kink in the patient's oxygen tubing and that no oxygen was coming out of the nasal cannula.following applying oxygen patient states that his symptoms gradually improved and is completely resolved at this time. Patient denies any complaints at present.   Past Medical History:  Diagnosis Date  . AAA (abdominal aortic aneurysm)repaired    a. 12/2006 s/p repair of 6cm infrarenal AAA with 38mm Dacron graft.  . Anemia   . Anginal pain ([place])   . Atrial fibrillation/Flutter    a. Dx 2014;  b. 10/2012 s/p TEE/DCCV;  c. On amio and Eliquis (CHA2DS2VASc = 6).  . Basal cell carcinoma    a. 2013 x 2 back and left neck  . BPH (benign prostatic hypertrophy)   . Carotid artery occlusion    a. 11/2008 s/p R CEA; b. 01/2017 Carotid U/S: RICA <56%, LICA >21%.  . Chronic systolic heart failure ([place])    a. 02/2012 Echo: EF 30-35%, Pulm HTN, and modest RV dysfunction;  b. 10/2012 TEE: EF 25-30%, mild conc LVH, diff HK, mod RV dysfxn, mod dil RA/LA, mild MR, no MS, nl MVR, mod TR, Ao sclerosis; c. 01/2017 Echo: EF 45-50%, diff HK, triv MR, mod dil LA.  . CKD (chronic kidney disease), stage IV ([place])   . Coronary artery disease    a. 1999 PCI RCA;  b. 01/2005 NSTEMI: CABG x 3 (LIMA->LAD, VG->OM, VG->LPL) @ time of MVR.  . Diabetes mellitus, insulin dependent (IDDM), controlled ([place])   . DVT (deep venous  thrombosis) ([place])   . GERD (gastroesophageal reflux disease)   . Heart disease   . History of hiatal hernia   . History of shingles   . Hyperlipidemia   . Hyperlipidemia, unspecified   . Hypertension   . Hypertensive heart disease   . Mitral regurgitation--s/p repair    a. 01/2005 MVR: 28-mm Edwards ET Logix ring annuloplasty; b. 10/2012 TEE: normal fxning MV ring with mild MR, no MS; 01/2017 Echo: Triv MR.  . Mixed Ischemic and Non-ischemic Cardiomyopathy    a. 02/2012 Echo: EF 30-35%, Pulm HTN, and modest RV dysfunction;  b. 10/2012 TEE: EF 25-30%, mild conc LVH, diff HK, mod RV dysfxn, mod dil RA/LA, mild MR, no MS, nl MVR, mod TR, Ao sclerosis; c. 01/2017 Echo: EF 45-50%.  . Myocardial infarction ([place])    2006  . Neuropathy    SOME  . Obesity   . Onychocryptosis   . Onychomycosis   . Orthopnea   . Orthostatic hypotension    a. 11/2016 -> meds reduced;  b. 01/2017 admitted w/ presyncope and orthostasis->Meds held.  . Oxygen deficit    a. Wears 2 lpm most of the day and @ HS.  . Paresthesias    in the feet  . Paronychia of third toe, right    contusion with paronychia right third toe  . Presence of permanent cardiac pacemaker   .  PVC (premature ventricular contraction)    a. 02/2009 s/p RFCA ([place]).  . Sick sinus syndrome ([place])    a. 01/2005 s/p PPM 2/2 heart block following CABG;  b. 05/2012 Gen Change: MDT Adapta ADDRL1 DC PPM, ser # [id number].  . Sleep apnea    a. Intolerant of CPAP  . Type II diabetes mellitus [facility])     Patient Active Problem List   Diagnosis Date Noted  . Unstageable pressure ulcer of sacral region ([place]) 04/25/2017  . Palliative care by specialist   . Acute CVA (cerebrovascular accident) ([place]) 04/23/2017  . Dizziness 04/21/2017  . Peripheral vascular disease of lower extremity with ulceration ([place]) 04/02/2017  . Skin ulcer of second toe of left foot with fat layer exposed ([place]) 04/02/2017  . Skin ulcer of second toe of right foot ([place]) 04/02/2017  .  Non-healing non-surgical wound 04/02/2017  . Hypoalbuminemia due to protein-calorie malnutrition ([place]) 04/02/2017  . Vitamin D deficiency 04/02/2017  . Adrenal insufficiency (Addison's disease) ([place]) 04/02/2017  . Near syncope 02/02/2017  . Pressure injury of skin 02/02/2017  . Orthostasis 12/04/2016  . LGI bleed 06/10/2016  . Acute blood loss anemia 06/10/2016  . Chronic respiratory failure with hypoxia, on home O2 therapy ([place]) 06/10/2016  . PAD (peripheral artery disease) ([place]) 06/05/2016  . Chest pain 10/28/2015  . Gastroenteritis   . Diarrhea of infectious origin   . Emesis   . S/P CABG (coronary artery bypass graft)   . Coronary artery disease involving native coronary artery of native heart without angina pectoris   . PAF (paroxysmal atrial fibrillation) ([place])   . Absolute anemia 01/28/2015  . Atherosclerosis of coronary artery 01/28/2015  . Barrett's esophagus 01/28/2015  . Basal cell carcinoma of face 01/28/2015  . Benign fibroma of prostate 01/28/2015  . Diabetes ([place]) 01/28/2015  . Gastro-esophageal reflux disease without esophagitis 01/28/2015  . Irritable colon 01/28/2015  . Pleural cavity effusion 01/28/2015  . Apnea, sleep 01/28/2015  . Hematuria 12/21/2014  . Left hand weakness 09/23/2013  . Essential hypertension 06/23/2013  . Acute on chronic combined systolic and diastolic CHF (congestive heart failure) ([place]) 06/10/2013  . Hypotension, postural 06/10/2013  . Hyperkalemia 06/10/2013  . Goals of care, counseling/discussion 06/10/2013  . Atrial fibrillation and flutter ([place]) 10/22/2012  . Pacemaker-Medtronic 06/19/2012  . Pulmonary hypertension ([place]) 03/18/2012  . Occlusion and stenosis of carotid artery without mention of cerebral infarction 02/08/2012  . Morbid obesity ([place]) 08/05/2010  . Sinoatrial node dysfunction (HCC) 06/02/2010  . Chronic kidney disease (CKD), stage IV (severe) ([place]) 08/09/2009  . AAA 04/21/2009  . Ischemic cardiomyopathy  s/p CABG  11/23/2008  . MITRAL REGURGITATION 11/22/2008  . CAD (coronary artery disease) 11/22/2008  . Hyperlipidemia 03/02/2008  . ALLERGIC RHINITIS 03/02/2008  . OSA (obstructive sleep apnea) 03/02/2008    Past Surgical History:  Procedure Laterality Date  . ABDOMINAL AORTIC ANEURYSM REPAIR      May 2008,   . CARDIAC CATHETERIZATION  2006   @ [facility]  . CAROTID ANGIOGRAPHY N/A 04/24/2017   Procedure: Carotid Angiography;  Surgeon: Algernon Huxley, MD;  Location: [facility];  Service: Cardiovascular;  Laterality: N/A;  . CATARACT EXTRACTION     right  . CATARACT EXTRACTION W/PHACO Left 10/04/2016   Procedure: CATARACT EXTRACTION PHACO AND INTRAOCULAR LENS PLACEMENT (IOC);  Surgeon: Estill Cotta, MD;  Location: [facility] ORS;  Service: Ophthalmology;  Laterality: Left;  [place] 01:21 AP% 25.9 CDE 39.08 Fluid pack lot # [id number]   .  CORONARY ARTERY BYPASS GRAFT     CABG with a  LIMA to the LAD, SVG to circumflex, SVG to posterolateral June2006.  Randolm Idol / REPLACE / REMOVE PACEMAKER  2006   Dr. [name]  . MITRAL VALVE REPAIR  2006    mitral valve repair with a #28 Edwards logic ring  . PACEMAKER PLACEMENT    . PERMANENT PACEMAKER GENERATOR CHANGE N/A 06/26/2012   Procedure: PERMANENT PACEMAKER GENERATOR CHANGE;  Surgeon: Deboraha Sprang, MD;  Location[facility] C[facility] LAB;  Service: Cardiovascular;  Laterality: N/A;  . SKIN BIOPSY     left wrist; right elbow  . SKIN CANCER EXCISION      x 6  . SKIN CANCER EXCISION    . TRANSESOPHAGEAL ECHOCARDIOGRAM    . VASECTOMY      Prior to Admission medications   Medication Sig Start Date End Date Taking? Authorizing Provider  ACCU-CHEK FASTCLIX LANCETS MISC TEST twice a day if needed 07/04/16   [name], MD  ACCU-CHEK SMARTVIEW test strip TEST twice a day 07/05/16   [name], MD  albuterol (PROVENTIL) (2.5 MG/3ML) 0.083% nebulizer solution Take 3 mLs (2.5 mg total) by nebulization every 6 (six) hours as needed for wheezing or  shortness of breath. 02/07/17   Loletha Grayer, MD  Amino Acids-Protein Hydrolys (FEEDING SUPPLEMENT, PRO-STAT SUGAR FREE 64,) LIQD Take 30 mLs by mouth 2 (two) times daily between meals.    [provider]  amiodarone (PACERONE) 200 MG tablet take 1 tablet by mouth once daily 01/08/17   Minna Merritts, MD  bacitracin 500 UNIT/GM ointment Apply 1 application topically daily. Apply to tip of 2nd toe    [provider]  BD INSULIN SYRINGE ULTRAFINE 31G X 5/16" 0.3 ML MISC use twice a day as directed 04/03/16   [name], MD  calcitRIOL (ROCALTROL) 0.5 MCG capsule Take 0.5 mcg by mouth daily.    [provider]  carvedilol (COREG) 3.125 MG tablet Take 1 tablet (3.125 mg total) by mouth 2 (two) times daily with a meal. 04/25/17   Hillary Bow, MD  Cholecalciferol (VITAMIN D3) 2000 units capsule Take 2,000 Units by mouth daily.    [provider]  collagenase (SANTYL) ointment Apply 1 application topically daily. Apply a thick layer topically to wound on left chest, cover with ns damp to dry gauze, cover with Allevyn. Change gauze daily. Change Allevyn every 3 days.    [provider]  dextrose (GLUTOSE) 40 % GEL Take 1 Tube by mouth once as needed for low blood sugar. Give 1 tube every 30 minutes orally as needed until BS > 70 and pt is still responsive.    [provider]  docusate sodium (COLACE) 100 MG capsule Take 100 mg by mouth 2 (two) times daily.     [provider]  ELIQUIS 5 MG TABS tablet take 1 tablet by mouth twice a day 07/31/16   [name], MD  finasteride (PROSCAR) 5 MG tablet Take 1 tablet (5 mg total) by mouth daily after supper. 03/20/17   Vaughan Basta, MD  fludrocortisone (FLORINEF) 0.1 MG tablet Take 0.1 mg by mouth daily.    [provider]  glucagon (GLUCAGON EMERGENCY) 1 MG injection Inject 1 mg into the vein once as needed. Give 1 mg for hypoglycemia (symptomatic) not  responding to oral glucose, snack. May repeat x 1 after 15 minutes. USE ONLY IF PT IS OR IS BECOMING UNRESPONSIVE.  [provider]  GLUCERNA Barrie Folk) LIQD Take 1 Can by mouth 2 (two) times daily between meals.    [provider]  hydrocortisone (CORTEF) 10 MG tablet Take 10 mg by mouth 2 (two) times daily.    [provider]  insulin lispro protamine-lispro (HUMALOG MIX 75/25) (75-25) 100 UNIT/ML SUSP injection Inject 5 Units into the skin 2 (two) times daily with a meal. Patient taking differently: Inject 5 Units into the skin 2 (two) times daily with a meal. Taking 10 units 02/07/17   Wieting, Richard, MD  lisinopril (PRINIVIL,ZESTRIL) 2.5 MG tablet Take 1 tablet (2.5 mg total) by mouth daily. 04/26/17   Hillary Bow, MD  lisinopril (PRINIVIL,ZESTRIL) 5 MG tablet Take 1 tablet (5 mg total) by mouth daily. 04/24/17   Hillary Bow, MD  Melatonin 3 MG TABS Take 1 tablet by mouth at bedtime.    [provider]  Morphine Sulfate (MORPHINE CONCENTRATE) 10 mg / 0.5 ml concentrated solution Take 0.5 mLs (10 mg total) by mouth every 3 (three) hours as needed for severe pain or shortness of breath. 04/25/17   Hillary Bow, MD  Multiple Vitamins-Minerals (MULTIVITAL) tablet Take 1 tablet by mouth daily.    [provider]  omeprazole (PRILOSEC) 20 MG capsule Take 20 mg by mouth daily. Monday, Wednesday, friday     [provider]  OXYGEN Inhale 3 L into the lungs continuous.     [provider]  polyethylene glycol (MIRALAX / GLYCOLAX) packet Take 17 g by mouth daily as needed for mild constipation. 06/12/16   Fritzi Mandes, MD  rosuvastatin (CRESTOR) 20 MG tablet take 1 tablet by mouth at bedtime 10/02/16   Minna Merritts, MD  Skin Protectants, Misc. (ENDIT EX) Apply liberal amount topically to gluteal fold wound and surrounding tissue 2 times daily and as needed. Do not scrub skin. If soiled, wipe off top layer and add more cream.     [provider]  triamcinolone (NASACORT ALLERGY 24HR) 55 MCG/ACT AERO nasal inhaler Place 2 sprays into the nose daily.     [provider]  Zinc Oxide (BOUDREAUXS BUTT PASTE) 16 % OINT Apply liberal amount topically to areas of skin irritation 2 times daily and as needed. Ok to leave at bedside.    [provider]  zolpidem (AMBIEN) 5 MG tablet Take 5 mg by mouth at bedtime as needed for sleep.    [provider]    Allergies Shellfish allergy  Family History  Problem Relation Age of Onset  . Heart attack Mother 31  . Diabetes Sister   . Hypertension Sister   . Heart attack Sister   . Heart attack Brother 33       3 Brothers deceased from [place] in 27's or 36's  . Heart disease Brother        Heart Disease before age 45  . Heart attack Father 82  . Diabetes Father        Amputation: bilateral feet  . Heart disease Father   . Heart attack Sister   . Heart attack Sister   . Heart attack Brother 63  . Heart attack Brother 54  . Diabetes Son     Social History Social History  Substance Use Topics  . Smoking status: Former Smoker    Packs/day: 1.50    Years: 34.00    Types: Cigarettes    Quit date: 08/28/1987  . Smokeless tobacco: Never Used  . Alcohol use 0.6 oz/week  1 Cans of beer per week     Comment: Occasionally but none since his surgery in June    Review of Systems Constitutional: No fever/chills Eyes: No visual changes. ENT: No sore throat. Cardiovascular: Denies chest pain. Respiratory: Denies shortness of breath. Gastrointestinal: No abdominal pain.  No nausea, no vomiting.  No diarrhea.  No constipation. Genitourinary: Negative for dysuria. Musculoskeletal: Negative for neck pain.  Negative for back pain. Integumentary: Negative for rash. Neurological: Negative for headaches, focal weakness or numbness.   ____________________________________________   PHYSICAL EXAM:  VITAL SIGNS: ED Triage Vitals  Enc Vitals  Group     BP 05/03/17 0347 129/76     Pulse Rate 05/03/17 0347 62     Resp 05/03/17 0347 (!) 21     Temp 05/03/17 0347 98.2 F (36.8 C)     Temp Source 05/03/17 0347 Oral     SpO2 05/03/17 0345 (!) 88 %     Weight 05/03/17 0347 110.2 kg (243 lb)     Height 05/03/17 0347 1.829 m (6')     Head Circumference --      Peak Flow --      Pain Score 05/03/17 0346 0     Pain Loc --      Pain Edu? --      Excl. in [place]? --     Constitutional: Alert and oriented. Well appearing and in no acute distress. Eyes: Conjunctivae are normal.  Head: Atraumatic. Mouth/Throat: Mucous membranes are moist.  Oropharynx non-erythematous. Neck: No stridor.   Cardiovascular: Normal rate, regular rhythm. Good peripheral circulation. Grossly normal heart sounds. Respiratory: Normal respiratory effort.  No retractions. Lungs CTAB. Gastrointestinal: Soft and nontender. No distention.  Musculoskeletal: No lower extremity tenderness nor edema. No gross deformities of extremities. Neurologic:  Normal speech and language. No gross focal neurologic deficits are appreciated.  Skin:  Skin is warm, dry and intact. No rash noted. Psychiatric: Mood and affect are normal. Speech and behavior are normal.  ____________________________________________     Procedures   ____________________________________________   INITIAL IMPRESSION / ASSESSMENT AND PLAN / ED COURSE  Pertinent labs & imaging results that were available during my care of the patient were reviewed by me and considered in my medical decision making (see chart for details).  81 year old male presenting with above stated history. Patient's confusion and dyspnea most likely secondary to hypoxia given the fact that the patient's symptoms completely resolved when the patient was given appropriate oxygen at 3 L of confusion and hallucinations or any other symptoms.      ____________________________________________  FINAL CLINICAL IMPRESSION(S) / ED  DIAGNOSES  Final diagnoses:  Hypoxia     MEDICATIONS GIVEN DURING THIS VISIT:  Medications - No data to display   NEW OUTPATIENT MEDICATIONS STARTED DURING THIS VISIT:  New Prescriptions   No medications on file    Modified Medications   No medications on file    Discontinued Medications   No medications on file     Note:  This document was prepared using Dragon voice recognition software and may include unintentional dictation errors.    Gregor Hams, MD 05/03/17 [phone]

## 2017-05-03 NOTE — ED Notes (Signed)
Pt. Verbalizes understanding of d/c instructions and follow-up. VS stable and pain controlled per pt.  Pt. In NAD at time of d/c and denies further concerns regarding this visit. Pt. Stable at the time of departure from the unit, departing unit by the safest and most appropriate manner per that pt condition and limitations. Pt advised to return to the ED at any time for emergent concerns, or for new/worsening symptoms.   

## 2017-05-04 DIAGNOSIS — R531 Weakness: Secondary | ICD-10-CM | POA: Diagnosis not present

## 2017-05-04 DIAGNOSIS — R0602 Shortness of breath: Secondary | ICD-10-CM | POA: Diagnosis not present

## 2017-05-11 ENCOUNTER — Other Ambulatory Visit: Payer: Self-pay | Admitting: *Deleted

## 2017-05-11 NOTE — Patient Outreach (Signed)
[facility]) Care Management  05/11/2017  Marco Umanzor Sr. Jan 09, 1936 [id number]   Collaboration phone call to [facility] to confirm that patient is now receiving Hospice Services through [facility]. Patient to be closed [facility] care management.   [name] [facility] Care Management [phone]

## 2017-05-21 ENCOUNTER — Ambulatory Visit: Payer: Self-pay | Admitting: *Deleted

## 2017-05-24 DIAGNOSIS — Z9911 Dependence on respirator [ventilator] status: Secondary | ICD-10-CM | POA: Diagnosis not present

## 2017-05-24 DIAGNOSIS — Z7401 Bed confinement status: Secondary | ICD-10-CM | POA: Diagnosis not present

## 2017-05-24 DIAGNOSIS — R0602 Shortness of breath: Secondary | ICD-10-CM | POA: Diagnosis not present

## 2017-05-28 ENCOUNTER — Telehealth: Payer: Self-pay | Admitting: Family Medicine

## 2017-05-31 ENCOUNTER — Ambulatory Visit: Payer: Medicare Other | Admitting: Cardiovascular Disease

## 2017-06-04 NOTE — Telephone Encounter (Signed)
This encounter was created in error - please disregard.

## 2017-06-28 NOTE — Telephone Encounter (Signed)
LeighAnn with Hospice called to advise pt passed away today at 1:55am/MW

## 2017-06-28 NOTE — Telephone Encounter (Signed)
See below-Teyanna Thielman V Laylia Mui, RMA  

## 2017-06-28 DEATH — deceased

## 2017-10-03 NOTE — Telephone Encounter (Signed)
Patient deceased

## 2017-10-22 ENCOUNTER — Encounter (INDEPENDENT_AMBULATORY_CARE_PROVIDER_SITE_OTHER): Payer: Medicare Other

## 2017-10-22 ENCOUNTER — Ambulatory Visit (INDEPENDENT_AMBULATORY_CARE_PROVIDER_SITE_OTHER): Payer: Medicare Other | Admitting: Vascular Surgery

## 2018-10-31 IMAGING — CT CT HEAD W/O CM
3 series · 16 of 47 positions shown, 19 images · non-contrast
Comparison: CT head 05/01/2013.

CLINICAL DATA: Patient slipped and fell out of recliner. No
reported head trauma or loss of consciousness.

EXAM:
CT HEAD WITHOUT CONTRAST
TECHNIQUE: Contiguous axial images were obtained from the base of the skull
through the vertex without intravenous contrast.

[Series 2: head wo · axial · 0.51mm/px · z∈[-89,+41]mm · 10 of 32 slices shown, 13 images]
[im 3/32  brain]
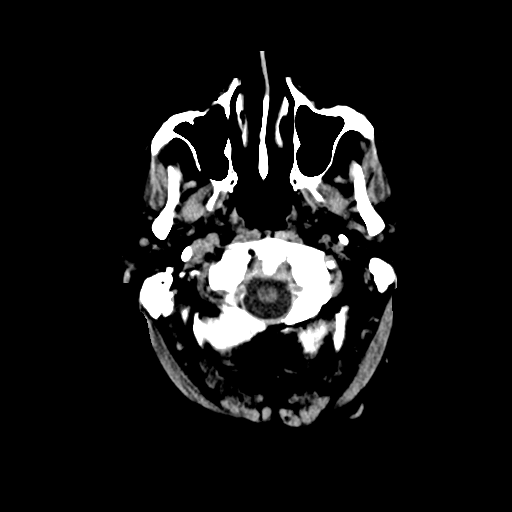
[im 3/32  bone]
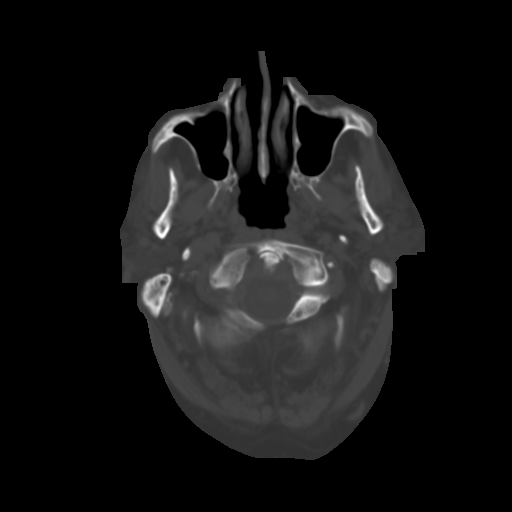
[im 6/32  brain]
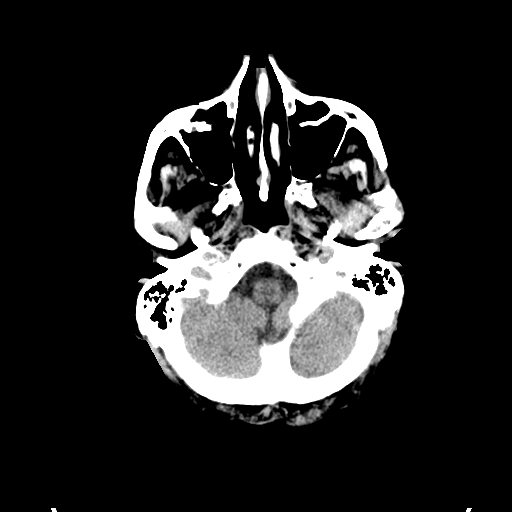
[im 9/32  brain]
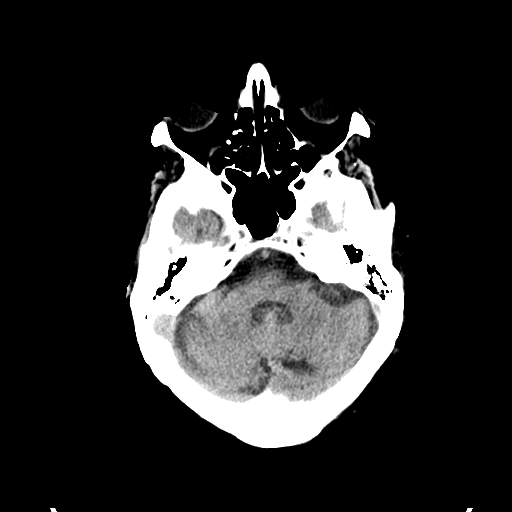
[im 11/32  brain]
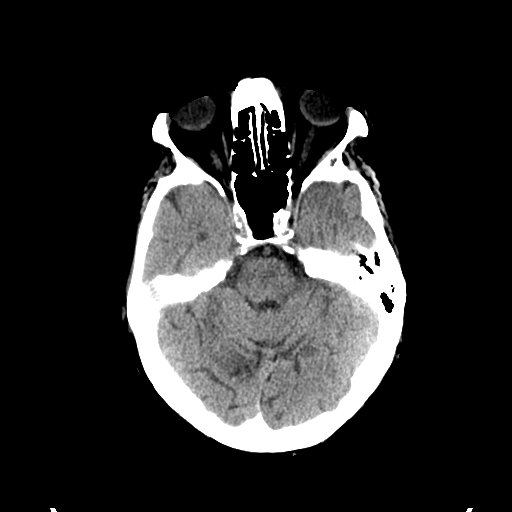
[im 14/32  brain]
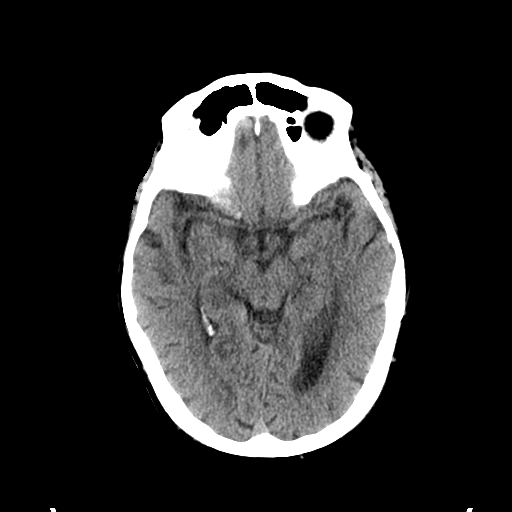
[im 14/32  bone]
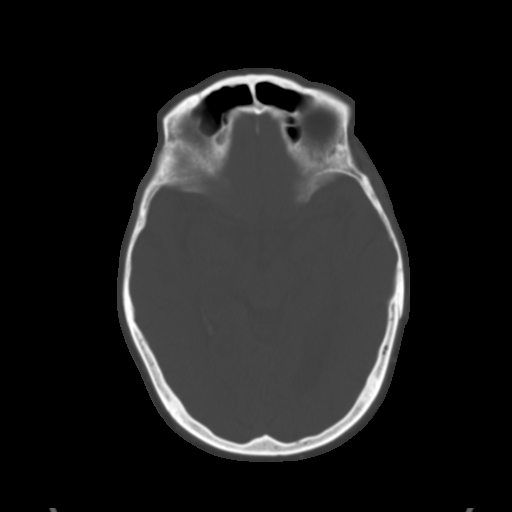
[im 18/32  brain]
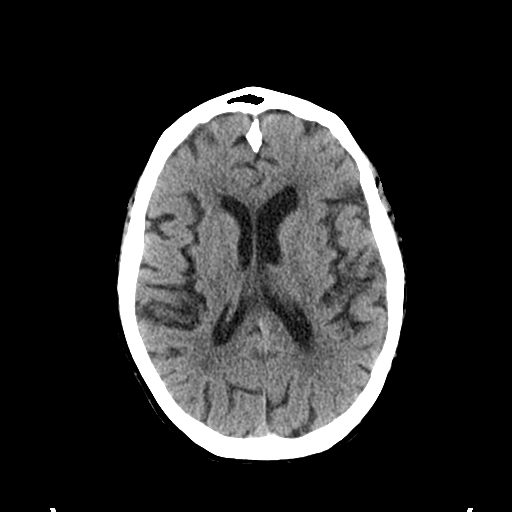
[im 21/32  brain]
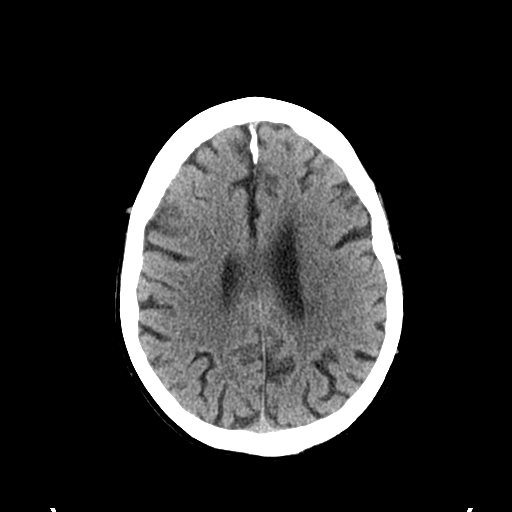
[im 24/32  brain]
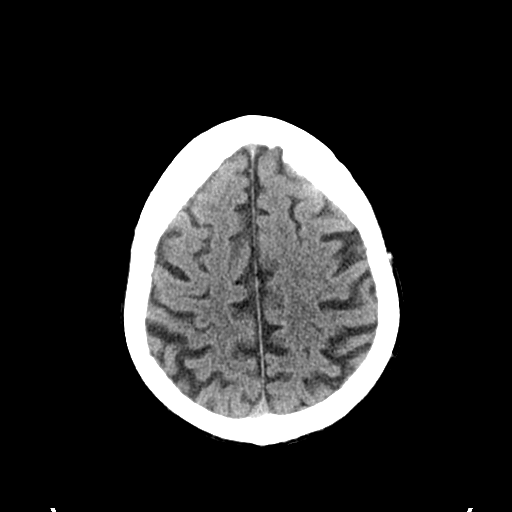
[im 26/32  brain]
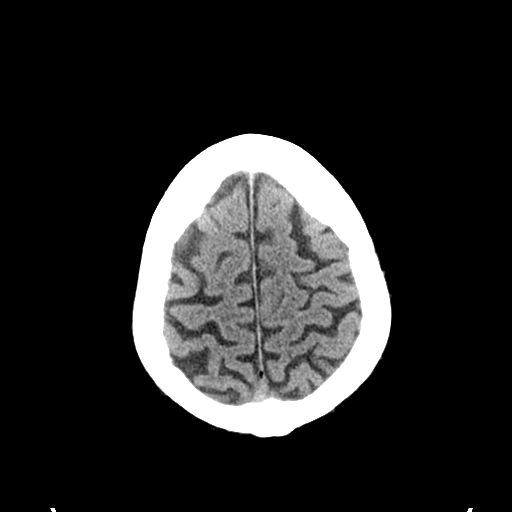
[im 26/32  bone]
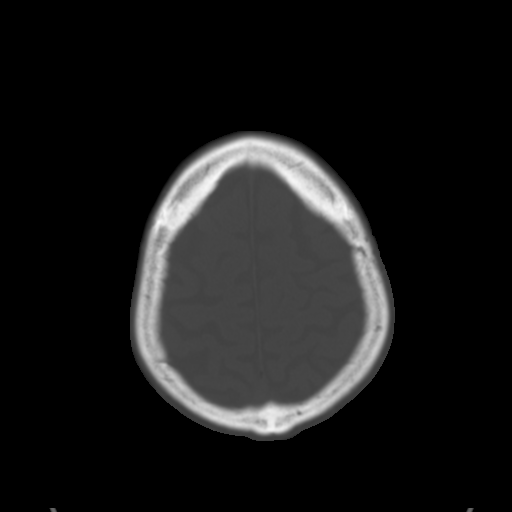
[im 29/32  brain]
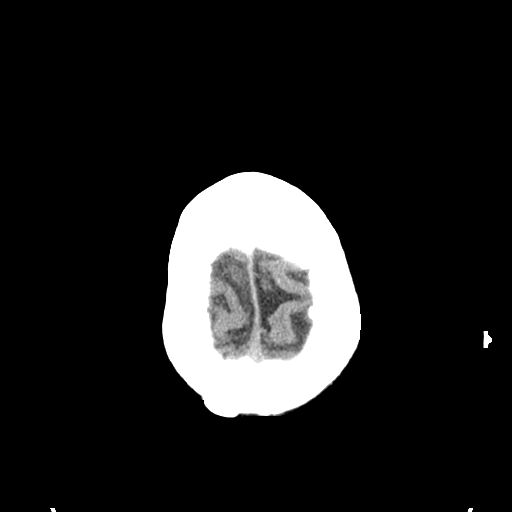

[Series 4: coronal soft tissue · coronal · 0.38mm/px · 3 of 73 slices shown]
[im 25/73  brain]
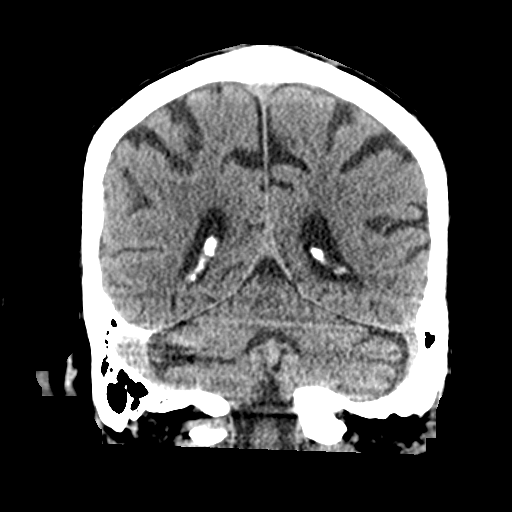
[im 33/73  brain]
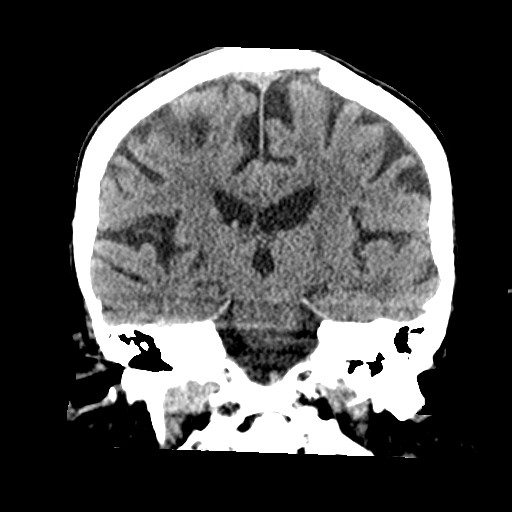
[im 41/73  brain]
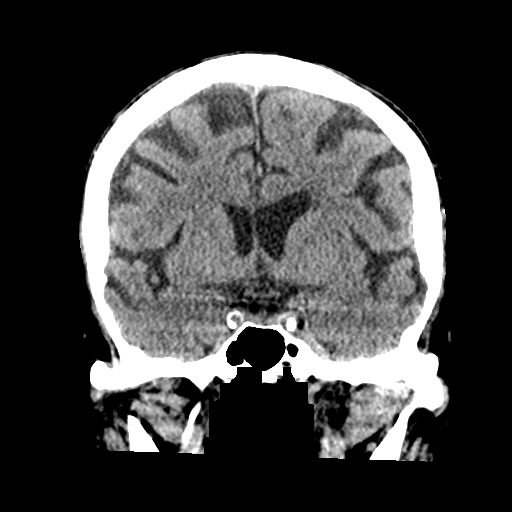

[Series 5: sagittal soft tissue · sagittal · 0.36mm/px · 3 of 60 slices shown]
[im 20/60  brain]
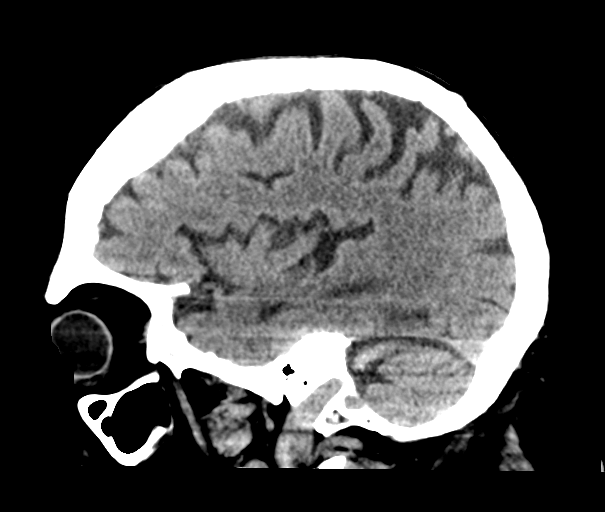
[im 30/60  brain]
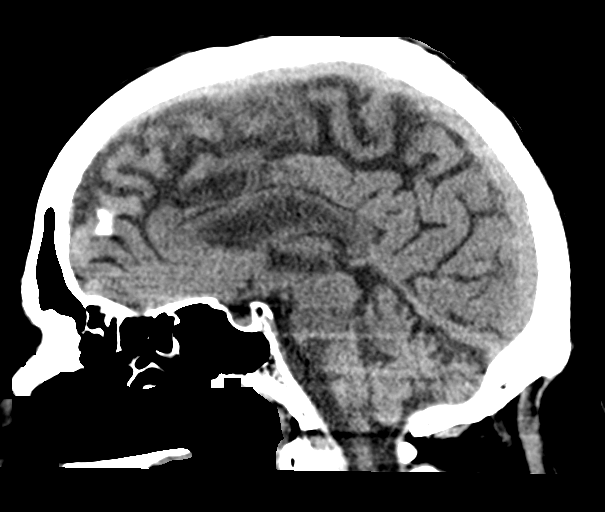
[im 40/60  brain]
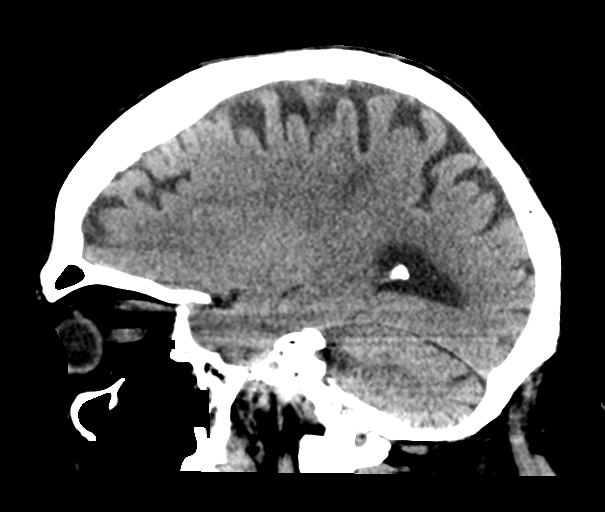

[16 of 47 positions shown; findings below may reference images not displayed]

FINDINGS: Brain: No evidence for acute infarction, hemorrhage, mass lesion,
hydrocephalus, or extra-axial fluid. There is moderately advanced
generalized atrophy. Hypoattenuation of white matter representing
small vessel disease. Old RIGHT basal ganglia infarct.

Vascular: Calcification of the cavernous internal carotid arteries
consistent with cerebrovascular atherosclerotic disease. No signs of
intracranial large vessel occlusion.

Skull: Normal. Negative for fracture or focal lesion.

Sinuses/Orbits: BILATERAL cataract extraction. No layering sinus
fluid.

Other: Compared with priors, atrophy has progressed.
IMPRESSION: Atrophy and small vessel disease. No acute intracranial abnormality.

No skull fracture or intracranial hemorrhage.

Cerebrovascular atherosclerosis.
